# Patient Record
Sex: Female | Born: 1960 | Race: Black or African American | Hispanic: No | Smoking: Current every day smoker
Health system: Southern US, Community
[De-identification: ages and names within clinical notes are randomized; demographics above are authoritative.]

## PROBLEM LIST (undated history)

## (undated) DIAGNOSIS — R238 Other skin changes: Secondary | ICD-10-CM

## (undated) DIAGNOSIS — R350 Frequency of micturition: Secondary | ICD-10-CM

## (undated) DIAGNOSIS — J189 Pneumonia, unspecified organism: Secondary | ICD-10-CM

## (undated) DIAGNOSIS — M2390 Unspecified internal derangement of unspecified knee: Secondary | ICD-10-CM

## (undated) DIAGNOSIS — T4145XA Adverse effect of unspecified anesthetic, initial encounter: Secondary | ICD-10-CM

## (undated) DIAGNOSIS — Z8619 Personal history of other infectious and parasitic diseases: Secondary | ICD-10-CM

## (undated) DIAGNOSIS — R51 Headache: Secondary | ICD-10-CM

## (undated) DIAGNOSIS — M12819 Other specific arthropathies, not elsewhere classified, unspecified shoulder: Secondary | ICD-10-CM

## (undated) DIAGNOSIS — R0602 Shortness of breath: Secondary | ICD-10-CM

## (undated) DIAGNOSIS — F419 Anxiety disorder, unspecified: Secondary | ICD-10-CM

## (undated) DIAGNOSIS — J984 Other disorders of lung: Secondary | ICD-10-CM

## (undated) DIAGNOSIS — M7120 Synovial cyst of popliteal space [Baker], unspecified knee: Secondary | ICD-10-CM

## (undated) DIAGNOSIS — M8710 Osteonecrosis due to drugs, unspecified bone: Secondary | ICD-10-CM

## (undated) DIAGNOSIS — M7550 Bursitis of unspecified shoulder: Secondary | ICD-10-CM

## (undated) DIAGNOSIS — Z833 Family history of diabetes mellitus: Secondary | ICD-10-CM

## (undated) DIAGNOSIS — M549 Dorsalgia, unspecified: Secondary | ICD-10-CM

## (undated) DIAGNOSIS — Z9289 Personal history of other medical treatment: Secondary | ICD-10-CM

## (undated) DIAGNOSIS — G8929 Other chronic pain: Secondary | ICD-10-CM

## (undated) DIAGNOSIS — C55 Malignant neoplasm of uterus, part unspecified: Secondary | ICD-10-CM

## (undated) DIAGNOSIS — I10 Essential (primary) hypertension: Secondary | ICD-10-CM

## (undated) DIAGNOSIS — J439 Emphysema, unspecified: Secondary | ICD-10-CM

## (undated) DIAGNOSIS — R351 Nocturia: Secondary | ICD-10-CM

## (undated) DIAGNOSIS — F319 Bipolar disorder, unspecified: Secondary | ICD-10-CM

## (undated) DIAGNOSIS — K859 Acute pancreatitis without necrosis or infection, unspecified: Secondary | ICD-10-CM

## (undated) DIAGNOSIS — T8859XA Other complications of anesthesia, initial encounter: Secondary | ICD-10-CM

## (undated) DIAGNOSIS — K5909 Other constipation: Secondary | ICD-10-CM

## (undated) DIAGNOSIS — F32A Depression, unspecified: Secondary | ICD-10-CM

## (undated) DIAGNOSIS — R8789 Other abnormal findings in specimens from female genital organs: Secondary | ICD-10-CM

## (undated) DIAGNOSIS — R233 Spontaneous ecchymoses: Secondary | ICD-10-CM

## (undated) DIAGNOSIS — R32 Unspecified urinary incontinence: Secondary | ICD-10-CM

## (undated) DIAGNOSIS — M25569 Pain in unspecified knee: Secondary | ICD-10-CM

## (undated) DIAGNOSIS — M1712 Unilateral primary osteoarthritis, left knee: Secondary | ICD-10-CM

## (undated) DIAGNOSIS — R42 Dizziness and giddiness: Secondary | ICD-10-CM

## (undated) DIAGNOSIS — M2550 Pain in unspecified joint: Secondary | ICD-10-CM

## (undated) DIAGNOSIS — B20 Human immunodeficiency virus [HIV] disease: Secondary | ICD-10-CM

## (undated) DIAGNOSIS — T50905A Adverse effect of unspecified drugs, medicaments and biological substances, initial encounter: Secondary | ICD-10-CM

## (undated) DIAGNOSIS — H269 Unspecified cataract: Secondary | ICD-10-CM

## (undated) DIAGNOSIS — K219 Gastro-esophageal reflux disease without esophagitis: Secondary | ICD-10-CM

## (undated) DIAGNOSIS — Z720 Tobacco use: Secondary | ICD-10-CM

## (undated) DIAGNOSIS — M1990 Unspecified osteoarthritis, unspecified site: Secondary | ICD-10-CM

## (undated) DIAGNOSIS — F329 Major depressive disorder, single episode, unspecified: Secondary | ICD-10-CM

## (undated) DIAGNOSIS — K259 Gastric ulcer, unspecified as acute or chronic, without hemorrhage or perforation: Secondary | ICD-10-CM

## (undated) DIAGNOSIS — B029 Zoster without complications: Secondary | ICD-10-CM

## (undated) DIAGNOSIS — F10231 Alcohol dependence with withdrawal delirium: Secondary | ICD-10-CM

## (undated) DIAGNOSIS — M2540 Effusion, unspecified joint: Secondary | ICD-10-CM

## (undated) HISTORY — DX: Other disorders of lung: J98.4

## (undated) HISTORY — PX: OTHER SURGICAL HISTORY: SHX169

## (undated) HISTORY — PX: ESOPHAGOGASTRODUODENOSCOPY: SHX1529

## (undated) HISTORY — PX: CHOLECYSTECTOMY: SHX55

## (undated) HISTORY — DX: Human immunodeficiency virus (HIV) disease: B20

## (undated) HISTORY — DX: Tobacco use: Z72.0

## (undated) HISTORY — PX: BREAST BIOPSY: SHX20

## (undated) HISTORY — DX: Other abnormal findings in specimens from female genital organs: R87.89

## (undated) HISTORY — DX: Synovial cyst of popliteal space (Baker), unspecified knee: M71.20

## (undated) HISTORY — DX: Essential (primary) hypertension: I10

## (undated) HISTORY — DX: Gastro-esophageal reflux disease without esophagitis: K21.9

## (undated) HISTORY — PX: KNEE ARTHROSCOPY: SUR90

## (undated) HISTORY — DX: Other specific arthropathies, not elsewhere classified, unspecified shoulder: M12.819

## (undated) HISTORY — DX: Bursitis of unspecified shoulder: M75.50

## (undated) HISTORY — DX: Family history of diabetes mellitus: Z83.3

## (undated) HISTORY — PX: FINGER SURGERY: SHX640

## (undated) HISTORY — PX: JOINT REPLACEMENT: SHX530

## (undated) HISTORY — DX: Acute pancreatitis without necrosis or infection, unspecified: K85.90

## (undated) HISTORY — DX: Zoster without complications: B02.9

## (undated) HISTORY — PX: FRACTURE SURGERY: SHX138

## (undated) HISTORY — PX: BREAST LUMPECTOMY: SHX2

---
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
## 1999-10-23 DIAGNOSIS — B20 Human immunodeficiency virus [HIV] disease: Secondary | ICD-10-CM | POA: Insufficient documentation

## 1999-10-23 DIAGNOSIS — B029 Zoster without complications: Secondary | ICD-10-CM

## 1999-10-23 HISTORY — DX: Zoster without complications: B02.9

## 2001-02-19 DIAGNOSIS — Z8719 Personal history of other diseases of the digestive system: Secondary | ICD-10-CM | POA: Insufficient documentation

## 2001-02-22 ENCOUNTER — Inpatient Hospital Stay (HOSPITAL_COMMUNITY): Admission: EM | Admit: 2001-02-22 | Discharge: 2001-02-26 | Payer: Self-pay | Admitting: *Deleted

## 2001-02-22 ENCOUNTER — Encounter: Payer: Self-pay | Admitting: *Deleted

## 2001-03-28 ENCOUNTER — Ambulatory Visit (HOSPITAL_COMMUNITY): Admission: RE | Admit: 2001-03-28 | Discharge: 2001-03-28 | Payer: Self-pay | Admitting: Infectious Diseases

## 2001-03-28 ENCOUNTER — Encounter: Admission: RE | Admit: 2001-03-28 | Discharge: 2001-03-28 | Payer: Self-pay | Admitting: Infectious Diseases

## 2001-04-10 ENCOUNTER — Encounter: Admission: RE | Admit: 2001-04-10 | Discharge: 2001-04-10 | Payer: Self-pay | Admitting: Infectious Diseases

## 2001-04-29 ENCOUNTER — Encounter: Payer: Self-pay | Admitting: Internal Medicine

## 2001-04-29 ENCOUNTER — Ambulatory Visit (HOSPITAL_COMMUNITY): Admission: RE | Admit: 2001-04-29 | Discharge: 2001-04-29 | Payer: Self-pay | Admitting: Internal Medicine

## 2001-05-21 ENCOUNTER — Encounter: Admission: RE | Admit: 2001-05-21 | Discharge: 2001-05-21 | Payer: Self-pay | Admitting: Infectious Diseases

## 2001-06-13 ENCOUNTER — Ambulatory Visit (HOSPITAL_COMMUNITY): Admission: RE | Admit: 2001-06-13 | Discharge: 2001-06-13 | Payer: Self-pay | Admitting: Family Medicine

## 2001-06-13 ENCOUNTER — Encounter: Payer: Self-pay | Admitting: Family Medicine

## 2001-06-17 ENCOUNTER — Encounter (INDEPENDENT_AMBULATORY_CARE_PROVIDER_SITE_OTHER): Payer: Self-pay | Admitting: *Deleted

## 2001-06-17 ENCOUNTER — Encounter: Admission: RE | Admit: 2001-06-17 | Discharge: 2001-06-17 | Payer: Self-pay | Admitting: Obstetrics & Gynecology

## 2001-07-09 ENCOUNTER — Encounter: Admission: RE | Admit: 2001-07-09 | Discharge: 2001-07-09 | Payer: Self-pay | Admitting: Infectious Diseases

## 2001-07-21 ENCOUNTER — Encounter: Payer: Self-pay | Admitting: Surgery

## 2001-07-21 ENCOUNTER — Encounter (INDEPENDENT_AMBULATORY_CARE_PROVIDER_SITE_OTHER): Payer: Self-pay | Admitting: *Deleted

## 2001-07-21 ENCOUNTER — Ambulatory Visit (HOSPITAL_COMMUNITY): Admission: RE | Admit: 2001-07-21 | Discharge: 2001-07-21 | Payer: Self-pay | Admitting: Surgery

## 2001-08-13 ENCOUNTER — Encounter: Admission: RE | Admit: 2001-08-13 | Discharge: 2001-08-13 | Payer: Self-pay | Admitting: Infectious Diseases

## 2001-08-13 ENCOUNTER — Ambulatory Visit (HOSPITAL_COMMUNITY): Admission: RE | Admit: 2001-08-13 | Discharge: 2001-08-13 | Payer: Self-pay | Admitting: Infectious Diseases

## 2001-08-13 ENCOUNTER — Encounter (INDEPENDENT_AMBULATORY_CARE_PROVIDER_SITE_OTHER): Payer: Self-pay | Admitting: *Deleted

## 2001-08-13 LAB — CONVERTED CEMR LAB
CD4 Count: 240 microliters
CD4 T Cell Abs: 240

## 2001-08-25 ENCOUNTER — Encounter: Payer: Self-pay | Admitting: Infectious Diseases

## 2001-08-25 ENCOUNTER — Ambulatory Visit (HOSPITAL_COMMUNITY): Admission: RE | Admit: 2001-08-25 | Discharge: 2001-08-25 | Payer: Self-pay | Admitting: Infectious Diseases

## 2001-10-03 ENCOUNTER — Encounter: Admission: RE | Admit: 2001-10-03 | Discharge: 2001-10-03 | Payer: Self-pay | Admitting: Infectious Diseases

## 2001-10-22 DIAGNOSIS — J984 Other disorders of lung: Secondary | ICD-10-CM

## 2001-10-22 HISTORY — DX: Other disorders of lung: J98.4

## 2001-10-28 ENCOUNTER — Ambulatory Visit (HOSPITAL_COMMUNITY): Admission: RE | Admit: 2001-10-28 | Discharge: 2001-10-28 | Payer: Self-pay | Admitting: Infectious Diseases

## 2001-10-28 ENCOUNTER — Encounter: Payer: Self-pay | Admitting: Infectious Diseases

## 2001-10-28 ENCOUNTER — Encounter: Admission: RE | Admit: 2001-10-28 | Discharge: 2001-10-28 | Payer: Self-pay | Admitting: Infectious Diseases

## 2001-11-26 ENCOUNTER — Encounter: Admission: RE | Admit: 2001-11-26 | Discharge: 2001-11-26 | Payer: Self-pay | Admitting: Infectious Diseases

## 2002-01-13 ENCOUNTER — Encounter: Admission: RE | Admit: 2002-01-13 | Discharge: 2002-01-13 | Payer: Self-pay | Admitting: Internal Medicine

## 2002-01-13 ENCOUNTER — Encounter (INDEPENDENT_AMBULATORY_CARE_PROVIDER_SITE_OTHER): Payer: Self-pay | Admitting: *Deleted

## 2002-02-18 ENCOUNTER — Ambulatory Visit (HOSPITAL_COMMUNITY): Admission: RE | Admit: 2002-02-18 | Discharge: 2002-02-18 | Payer: Self-pay | Admitting: Infectious Diseases

## 2002-02-18 ENCOUNTER — Encounter: Admission: RE | Admit: 2002-02-18 | Discharge: 2002-02-18 | Payer: Self-pay | Admitting: Infectious Diseases

## 2002-04-26 ENCOUNTER — Encounter: Payer: Self-pay | Admitting: Emergency Medicine

## 2002-04-26 ENCOUNTER — Emergency Department (HOSPITAL_COMMUNITY): Admission: EM | Admit: 2002-04-26 | Discharge: 2002-04-27 | Payer: Self-pay | Admitting: Emergency Medicine

## 2002-05-06 ENCOUNTER — Encounter: Admission: RE | Admit: 2002-05-06 | Discharge: 2002-05-06 | Payer: Self-pay | Admitting: Infectious Diseases

## 2002-05-06 ENCOUNTER — Ambulatory Visit (HOSPITAL_COMMUNITY): Admission: RE | Admit: 2002-05-06 | Discharge: 2002-05-06 | Payer: Self-pay | Admitting: Infectious Diseases

## 2002-05-19 ENCOUNTER — Encounter: Admission: RE | Admit: 2002-05-19 | Discharge: 2002-05-19 | Payer: Self-pay | Admitting: *Deleted

## 2002-12-10 ENCOUNTER — Encounter: Admission: RE | Admit: 2002-12-10 | Discharge: 2002-12-10 | Payer: Self-pay | Admitting: Infectious Diseases

## 2003-01-13 ENCOUNTER — Ambulatory Visit (HOSPITAL_COMMUNITY): Admission: RE | Admit: 2003-01-13 | Discharge: 2003-01-13 | Payer: Self-pay | Admitting: Family Medicine

## 2003-01-13 ENCOUNTER — Encounter: Payer: Self-pay | Admitting: Family Medicine

## 2003-01-28 ENCOUNTER — Inpatient Hospital Stay (HOSPITAL_COMMUNITY): Admission: EM | Admit: 2003-01-28 | Discharge: 2003-02-03 | Payer: Self-pay | Admitting: Internal Medicine

## 2003-01-28 ENCOUNTER — Encounter: Payer: Self-pay | Admitting: Internal Medicine

## 2003-01-29 ENCOUNTER — Encounter: Payer: Self-pay | Admitting: *Deleted

## 2003-03-31 ENCOUNTER — Encounter: Admission: RE | Admit: 2003-03-31 | Discharge: 2003-03-31 | Payer: Self-pay | Admitting: Infectious Diseases

## 2003-03-31 ENCOUNTER — Encounter: Payer: Self-pay | Admitting: Infectious Diseases

## 2003-06-30 ENCOUNTER — Encounter: Admission: RE | Admit: 2003-06-30 | Discharge: 2003-06-30 | Payer: Self-pay | Admitting: Infectious Diseases

## 2003-08-30 ENCOUNTER — Encounter: Payer: Self-pay | Admitting: Infectious Diseases

## 2003-08-30 ENCOUNTER — Encounter: Admission: RE | Admit: 2003-08-30 | Discharge: 2003-08-30 | Payer: Self-pay | Admitting: Infectious Diseases

## 2003-08-30 ENCOUNTER — Ambulatory Visit (HOSPITAL_COMMUNITY): Admission: RE | Admit: 2003-08-30 | Discharge: 2003-08-30 | Payer: Self-pay | Admitting: Infectious Diseases

## 2003-09-03 ENCOUNTER — Encounter: Admission: RE | Admit: 2003-09-03 | Discharge: 2003-09-03 | Payer: Self-pay | Admitting: Infectious Diseases

## 2003-11-25 ENCOUNTER — Ambulatory Visit (HOSPITAL_COMMUNITY): Admission: RE | Admit: 2003-11-25 | Discharge: 2003-11-25 | Payer: Self-pay | Admitting: Family Medicine

## 2003-11-29 ENCOUNTER — Encounter: Admission: RE | Admit: 2003-11-29 | Discharge: 2003-11-29 | Payer: Self-pay | Admitting: Infectious Diseases

## 2003-12-20 ENCOUNTER — Encounter: Admission: RE | Admit: 2003-12-20 | Discharge: 2003-12-20 | Payer: Self-pay | Admitting: Infectious Diseases

## 2004-01-12 ENCOUNTER — Ambulatory Visit (HOSPITAL_COMMUNITY): Admission: RE | Admit: 2004-01-12 | Discharge: 2004-01-12 | Payer: Self-pay | Admitting: Infectious Diseases

## 2004-01-12 ENCOUNTER — Encounter: Admission: RE | Admit: 2004-01-12 | Discharge: 2004-01-12 | Payer: Self-pay | Admitting: Infectious Diseases

## 2004-05-02 ENCOUNTER — Ambulatory Visit (HOSPITAL_COMMUNITY): Admission: RE | Admit: 2004-05-02 | Discharge: 2004-05-02 | Payer: Self-pay | Admitting: *Deleted

## 2004-05-08 ENCOUNTER — Ambulatory Visit (HOSPITAL_COMMUNITY): Admission: RE | Admit: 2004-05-08 | Discharge: 2004-05-08 | Payer: Self-pay | Admitting: Infectious Diseases

## 2004-05-08 ENCOUNTER — Encounter: Admission: RE | Admit: 2004-05-08 | Discharge: 2004-05-08 | Payer: Self-pay | Admitting: Infectious Diseases

## 2004-05-10 ENCOUNTER — Ambulatory Visit (HOSPITAL_COMMUNITY): Admission: RE | Admit: 2004-05-10 | Discharge: 2004-05-10 | Payer: Self-pay | Admitting: Infectious Diseases

## 2004-07-18 ENCOUNTER — Emergency Department (HOSPITAL_COMMUNITY): Admission: EM | Admit: 2004-07-18 | Discharge: 2004-07-18 | Payer: Self-pay | Admitting: *Deleted

## 2004-07-31 ENCOUNTER — Emergency Department (HOSPITAL_COMMUNITY): Admission: EM | Admit: 2004-07-31 | Discharge: 2004-07-31 | Payer: Self-pay | Admitting: Emergency Medicine

## 2004-08-16 ENCOUNTER — Ambulatory Visit: Payer: Self-pay | Admitting: Infectious Diseases

## 2004-08-17 ENCOUNTER — Ambulatory Visit (HOSPITAL_COMMUNITY): Admission: RE | Admit: 2004-08-17 | Discharge: 2004-08-17 | Payer: Self-pay | Admitting: Family Medicine

## 2004-09-13 ENCOUNTER — Ambulatory Visit: Payer: Self-pay | Admitting: Family Medicine

## 2004-09-18 ENCOUNTER — Ambulatory Visit (HOSPITAL_COMMUNITY): Admission: RE | Admit: 2004-09-18 | Discharge: 2004-09-18 | Payer: Self-pay | Admitting: Family Medicine

## 2004-10-11 ENCOUNTER — Ambulatory Visit: Payer: Self-pay | Admitting: Infectious Diseases

## 2004-10-11 ENCOUNTER — Ambulatory Visit (HOSPITAL_COMMUNITY): Admission: RE | Admit: 2004-10-11 | Discharge: 2004-10-11 | Payer: Self-pay | Admitting: Infectious Diseases

## 2004-11-28 ENCOUNTER — Ambulatory Visit: Payer: Self-pay | Admitting: Family Medicine

## 2004-12-13 ENCOUNTER — Ambulatory Visit: Payer: Self-pay | Admitting: Family Medicine

## 2005-01-17 ENCOUNTER — Ambulatory Visit (HOSPITAL_COMMUNITY): Admission: RE | Admit: 2005-01-17 | Discharge: 2005-01-17 | Payer: Self-pay | Admitting: Infectious Diseases

## 2005-01-17 ENCOUNTER — Ambulatory Visit: Payer: Self-pay | Admitting: Infectious Diseases

## 2005-02-06 ENCOUNTER — Ambulatory Visit: Payer: Self-pay | Admitting: Family Medicine

## 2005-02-08 ENCOUNTER — Ambulatory Visit (HOSPITAL_COMMUNITY): Admission: RE | Admit: 2005-02-08 | Discharge: 2005-02-08 | Payer: Self-pay | Admitting: Family Medicine

## 2005-02-26 ENCOUNTER — Ambulatory Visit (HOSPITAL_COMMUNITY): Admission: RE | Admit: 2005-02-26 | Discharge: 2005-02-26 | Payer: Self-pay | Admitting: Infectious Diseases

## 2005-02-26 ENCOUNTER — Encounter (INDEPENDENT_AMBULATORY_CARE_PROVIDER_SITE_OTHER): Payer: Self-pay | Admitting: *Deleted

## 2005-02-26 ENCOUNTER — Ambulatory Visit: Payer: Self-pay | Admitting: Infectious Diseases

## 2005-02-26 LAB — CONVERTED CEMR LAB: CD4 Count: 340 microliters

## 2005-03-01 ENCOUNTER — Ambulatory Visit: Payer: Self-pay | Admitting: Family Medicine

## 2005-03-02 ENCOUNTER — Ambulatory Visit (HOSPITAL_COMMUNITY): Admission: RE | Admit: 2005-03-02 | Discharge: 2005-03-02 | Payer: Self-pay | Admitting: *Deleted

## 2005-03-05 ENCOUNTER — Ambulatory Visit (HOSPITAL_COMMUNITY): Admission: RE | Admit: 2005-03-05 | Discharge: 2005-03-05 | Payer: Self-pay | Admitting: Family Medicine

## 2005-03-16 ENCOUNTER — Ambulatory Visit (HOSPITAL_COMMUNITY): Admission: RE | Admit: 2005-03-16 | Discharge: 2005-03-16 | Payer: Self-pay | Admitting: Urology

## 2005-04-06 ENCOUNTER — Emergency Department (HOSPITAL_COMMUNITY): Admission: EM | Admit: 2005-04-06 | Discharge: 2005-04-06 | Payer: Self-pay | Admitting: Emergency Medicine

## 2005-04-12 ENCOUNTER — Ambulatory Visit: Payer: Self-pay | Admitting: Family Medicine

## 2005-04-16 ENCOUNTER — Ambulatory Visit (HOSPITAL_COMMUNITY): Admission: RE | Admit: 2005-04-16 | Discharge: 2005-04-16 | Payer: Self-pay | Admitting: Family Medicine

## 2005-05-09 ENCOUNTER — Ambulatory Visit: Payer: Self-pay | Admitting: Family Medicine

## 2005-06-30 ENCOUNTER — Emergency Department (HOSPITAL_COMMUNITY): Admission: EM | Admit: 2005-06-30 | Discharge: 2005-06-30 | Payer: Self-pay | Admitting: *Deleted

## 2005-08-23 ENCOUNTER — Ambulatory Visit: Payer: Self-pay | Admitting: Family Medicine

## 2005-10-18 ENCOUNTER — Ambulatory Visit: Payer: Self-pay | Admitting: Family Medicine

## 2005-10-29 ENCOUNTER — Emergency Department (HOSPITAL_COMMUNITY): Admission: EM | Admit: 2005-10-29 | Discharge: 2005-10-29 | Payer: Self-pay | Admitting: Emergency Medicine

## 2005-11-06 ENCOUNTER — Ambulatory Visit: Payer: Self-pay | Admitting: Family Medicine

## 2005-11-28 ENCOUNTER — Encounter (INDEPENDENT_AMBULATORY_CARE_PROVIDER_SITE_OTHER): Payer: Self-pay | Admitting: *Deleted

## 2005-11-28 ENCOUNTER — Ambulatory Visit: Payer: Self-pay | Admitting: Infectious Diseases

## 2006-01-02 ENCOUNTER — Ambulatory Visit: Payer: Self-pay | Admitting: Infectious Diseases

## 2006-01-15 ENCOUNTER — Ambulatory Visit: Payer: Self-pay | Admitting: Family Medicine

## 2006-02-12 ENCOUNTER — Observation Stay (HOSPITAL_COMMUNITY): Admission: EM | Admit: 2006-02-12 | Discharge: 2006-02-14 | Payer: Self-pay | Admitting: *Deleted

## 2006-02-13 ENCOUNTER — Ambulatory Visit: Payer: Self-pay | Admitting: *Deleted

## 2006-02-20 ENCOUNTER — Ambulatory Visit: Payer: Self-pay | Admitting: Infectious Diseases

## 2006-02-20 ENCOUNTER — Encounter (INDEPENDENT_AMBULATORY_CARE_PROVIDER_SITE_OTHER): Payer: Self-pay | Admitting: *Deleted

## 2006-02-20 ENCOUNTER — Encounter: Admission: RE | Admit: 2006-02-20 | Discharge: 2006-02-20 | Payer: Self-pay | Admitting: Infectious Diseases

## 2006-05-20 ENCOUNTER — Ambulatory Visit: Payer: Self-pay | Admitting: Family Medicine

## 2006-06-12 ENCOUNTER — Encounter: Admission: RE | Admit: 2006-06-12 | Discharge: 2006-06-12 | Payer: Self-pay | Admitting: Infectious Diseases

## 2006-06-12 ENCOUNTER — Ambulatory Visit: Payer: Self-pay | Admitting: Infectious Diseases

## 2006-06-12 ENCOUNTER — Encounter (INDEPENDENT_AMBULATORY_CARE_PROVIDER_SITE_OTHER): Payer: Self-pay | Admitting: *Deleted

## 2006-06-12 LAB — CONVERTED CEMR LAB
CD4 Count: 330 microliters
HIV 1 RNA Quant: 1920 copies/mL

## 2006-08-31 DIAGNOSIS — B029 Zoster without complications: Secondary | ICD-10-CM | POA: Insufficient documentation

## 2006-08-31 DIAGNOSIS — I10 Essential (primary) hypertension: Secondary | ICD-10-CM | POA: Insufficient documentation

## 2006-08-31 DIAGNOSIS — M7120 Synovial cyst of popliteal space [Baker], unspecified knee: Secondary | ICD-10-CM | POA: Insufficient documentation

## 2006-08-31 DIAGNOSIS — J45909 Unspecified asthma, uncomplicated: Secondary | ICD-10-CM | POA: Insufficient documentation

## 2006-08-31 DIAGNOSIS — K649 Unspecified hemorrhoids: Secondary | ICD-10-CM | POA: Insufficient documentation

## 2006-08-31 DIAGNOSIS — K219 Gastro-esophageal reflux disease without esophagitis: Secondary | ICD-10-CM

## 2006-12-03 DIAGNOSIS — R8789 Other abnormal findings in specimens from female genital organs: Secondary | ICD-10-CM

## 2006-12-03 DIAGNOSIS — F17200 Nicotine dependence, unspecified, uncomplicated: Secondary | ICD-10-CM

## 2006-12-05 ENCOUNTER — Ambulatory Visit: Payer: Self-pay | Admitting: Family Medicine

## 2006-12-06 ENCOUNTER — Encounter: Payer: Self-pay | Admitting: Family Medicine

## 2006-12-06 LAB — CONVERTED CEMR LAB
Hemoglobin, Urine: NEGATIVE
Nitrite: NEGATIVE
RBC / HPF: NONE SEEN (ref <3)
Urine Glucose: NEGATIVE mg/dL
pH: 7 (ref 5.0–8.0)

## 2006-12-16 ENCOUNTER — Encounter (INDEPENDENT_AMBULATORY_CARE_PROVIDER_SITE_OTHER): Payer: Self-pay | Admitting: *Deleted

## 2006-12-16 LAB — CONVERTED CEMR LAB

## 2006-12-29 ENCOUNTER — Encounter (INDEPENDENT_AMBULATORY_CARE_PROVIDER_SITE_OTHER): Payer: Self-pay | Admitting: *Deleted

## 2007-01-13 ENCOUNTER — Emergency Department (HOSPITAL_COMMUNITY): Admission: EM | Admit: 2007-01-13 | Discharge: 2007-01-13 | Payer: Self-pay | Admitting: Emergency Medicine

## 2007-01-14 ENCOUNTER — Emergency Department (HOSPITAL_COMMUNITY): Admission: EM | Admit: 2007-01-14 | Discharge: 2007-01-14 | Payer: Self-pay | Admitting: Emergency Medicine

## 2007-02-18 ENCOUNTER — Ambulatory Visit: Payer: Self-pay | Admitting: Family Medicine

## 2007-02-19 ENCOUNTER — Encounter: Payer: Self-pay | Admitting: Family Medicine

## 2007-03-03 ENCOUNTER — Encounter: Admission: RE | Admit: 2007-03-03 | Discharge: 2007-03-03 | Payer: Self-pay | Admitting: Infectious Diseases

## 2007-03-03 ENCOUNTER — Ambulatory Visit: Payer: Self-pay | Admitting: Infectious Diseases

## 2007-03-03 DIAGNOSIS — R109 Unspecified abdominal pain: Secondary | ICD-10-CM | POA: Insufficient documentation

## 2007-03-03 DIAGNOSIS — M25559 Pain in unspecified hip: Secondary | ICD-10-CM | POA: Insufficient documentation

## 2007-03-03 LAB — CONVERTED CEMR LAB
ALT: 15 units/L (ref 0–35)
AST: 27 units/L (ref 0–37)
Albumin: 4.5 g/dL (ref 3.5–5.2)
Alkaline Phosphatase: 122 units/L — ABNORMAL HIGH (ref 39–117)
Amylase: 49 units/L (ref 0–105)
BUN: 9 mg/dL (ref 6–23)
CD4 Count: 340 microliters
CO2: 28 meq/L (ref 19–32)
Calcium: 9.6 mg/dL (ref 8.4–10.5)
Chloride: 101 meq/L (ref 96–112)
Cholesterol: 148 mg/dL (ref 0–200)
Creatinine, Ser: 0.73 mg/dL (ref 0.40–1.20)
Glucose, Bld: 105 mg/dL — ABNORMAL HIGH (ref 70–99)
HCT: 37.3 % (ref 36.0–46.0)
HDL: 65 mg/dL (ref >39)
HIV 1 RNA Quant: 2300 copies/mL — ABNORMAL HIGH (ref <50)
Hemoglobin: 12.1 g/dL (ref 12.0–15.0)
LDL Cholesterol: 45 mg/dL (ref 0–99)
Lipase: 41 units/L (ref 0–75)
MCHC: 32.4 g/dL (ref 30.0–36.0)
MCV: 97.4 fL (ref 78.0–100.0)
Platelets: 257 10*3/uL (ref 150–400)
Potassium: 3.7 meq/L (ref 3.5–5.3)
RBC: 3.83 M/uL — ABNORMAL LOW (ref 3.87–5.11)
RDW: 14.3 % — ABNORMAL HIGH (ref 11.5–14.0)
Sodium: 137 meq/L (ref 135–145)
Total Bilirubin: 0.7 mg/dL (ref 0.3–1.2)
Total CHOL/HDL Ratio: 2.3
Total Protein: 8.9 g/dL — ABNORMAL HIGH (ref 6.0–8.3)
Triglycerides: 192 mg/dL — ABNORMAL HIGH (ref <150)
VLDL: 38 mg/dL (ref 0–40)
WBC: 3.3 10*3/uL — ABNORMAL LOW (ref 4.0–10.5)

## 2007-03-11 ENCOUNTER — Emergency Department (HOSPITAL_COMMUNITY): Admission: EM | Admit: 2007-03-11 | Discharge: 2007-03-11 | Payer: Self-pay | Admitting: Emergency Medicine

## 2007-03-31 ENCOUNTER — Emergency Department (HOSPITAL_COMMUNITY): Admission: EM | Admit: 2007-03-31 | Discharge: 2007-03-31 | Payer: Self-pay | Admitting: Emergency Medicine

## 2007-04-17 ENCOUNTER — Telehealth: Payer: Self-pay | Admitting: Infectious Diseases

## 2007-04-23 ENCOUNTER — Emergency Department (HOSPITAL_COMMUNITY): Admission: EM | Admit: 2007-04-23 | Discharge: 2007-04-23 | Payer: Self-pay | Admitting: Emergency Medicine

## 2007-05-02 ENCOUNTER — Inpatient Hospital Stay (HOSPITAL_COMMUNITY): Admission: EM | Admit: 2007-05-02 | Discharge: 2007-05-03 | Payer: Self-pay | Admitting: Emergency Medicine

## 2007-05-15 ENCOUNTER — Ambulatory Visit: Payer: Self-pay | Admitting: Family Medicine

## 2007-06-27 ENCOUNTER — Ambulatory Visit: Payer: Self-pay | Admitting: Internal Medicine

## 2007-06-28 ENCOUNTER — Inpatient Hospital Stay (HOSPITAL_COMMUNITY): Admission: EM | Admit: 2007-06-28 | Discharge: 2007-07-01 | Payer: Self-pay | Admitting: Emergency Medicine

## 2007-06-30 ENCOUNTER — Ambulatory Visit: Payer: Self-pay | Admitting: Internal Medicine

## 2007-07-20 ENCOUNTER — Emergency Department (HOSPITAL_COMMUNITY): Admission: EM | Admit: 2007-07-20 | Discharge: 2007-07-20 | Payer: Self-pay | Admitting: Hematology and Oncology

## 2007-10-23 ENCOUNTER — Encounter: Payer: Self-pay | Admitting: Family Medicine

## 2007-11-05 ENCOUNTER — Ambulatory Visit: Payer: Self-pay | Admitting: Family Medicine

## 2007-11-19 ENCOUNTER — Encounter: Payer: Self-pay | Admitting: Family Medicine

## 2007-11-19 LAB — CONVERTED CEMR LAB
BUN: 9 mg/dL (ref 6–23)
CO2: 25 meq/L (ref 19–32)
Chloride: 97 meq/L (ref 96–112)
Eosinophils Absolute: 0 10*3/uL (ref 0.0–0.7)
Glucose, Bld: 113 mg/dL — ABNORMAL HIGH (ref 70–99)
LDL Cholesterol: 48 mg/dL (ref 0–99)
Lymphocytes Relative: 62 % — ABNORMAL HIGH (ref 12–46)
Lymphs Abs: 2.1 10*3/uL (ref 0.7–4.0)
MCV: 102.2 fL — ABNORMAL HIGH (ref 78.0–100.0)
Monocytes Relative: 9 % (ref 3–12)
Neutro Abs: 0.9 10*3/uL — ABNORMAL LOW (ref 1.7–7.7)
Neutrophils Relative %: 28 % — ABNORMAL LOW (ref 43–77)
Platelets: 260 10*3/uL (ref 150–400)
Potassium: 4.3 meq/L (ref 3.5–5.3)
RBC: 3.63 M/uL — ABNORMAL LOW (ref 3.87–5.11)
Sodium: 135 meq/L (ref 135–145)
TSH: 1.158 microintl units/mL (ref 0.350–5.50)
Total CHOL/HDL Ratio: 2
VLDL: 24 mg/dL (ref 0–40)
WBC: 3.3 10*3/uL — ABNORMAL LOW (ref 4.0–10.5)

## 2007-11-20 ENCOUNTER — Encounter: Payer: Self-pay | Admitting: Family Medicine

## 2007-12-01 ENCOUNTER — Encounter: Payer: Self-pay | Admitting: Family Medicine

## 2007-12-01 DIAGNOSIS — J1189 Influenza due to unidentified influenza virus with other manifestations: Secondary | ICD-10-CM

## 2008-01-26 ENCOUNTER — Emergency Department (HOSPITAL_COMMUNITY): Admission: EM | Admit: 2008-01-26 | Discharge: 2008-01-26 | Payer: Self-pay | Admitting: Emergency Medicine

## 2008-01-27 ENCOUNTER — Encounter (INDEPENDENT_AMBULATORY_CARE_PROVIDER_SITE_OTHER): Payer: Self-pay | Admitting: *Deleted

## 2008-02-19 ENCOUNTER — Emergency Department (HOSPITAL_COMMUNITY): Admission: EM | Admit: 2008-02-19 | Discharge: 2008-02-19 | Payer: Self-pay | Admitting: Emergency Medicine

## 2008-03-02 ENCOUNTER — Encounter: Payer: Self-pay | Admitting: Infectious Diseases

## 2008-03-30 ENCOUNTER — Emergency Department (HOSPITAL_COMMUNITY): Admission: EM | Admit: 2008-03-30 | Discharge: 2008-03-30 | Payer: Self-pay | Admitting: Emergency Medicine

## 2008-04-21 ENCOUNTER — Ambulatory Visit: Payer: Self-pay | Admitting: Family Medicine

## 2008-04-27 ENCOUNTER — Emergency Department (HOSPITAL_COMMUNITY): Admission: EM | Admit: 2008-04-27 | Discharge: 2008-04-27 | Payer: Self-pay | Admitting: Emergency Medicine

## 2008-04-27 ENCOUNTER — Telehealth: Payer: Self-pay | Admitting: Infectious Diseases

## 2008-05-13 ENCOUNTER — Telehealth (INDEPENDENT_AMBULATORY_CARE_PROVIDER_SITE_OTHER): Payer: Self-pay | Admitting: *Deleted

## 2008-05-18 ENCOUNTER — Telehealth: Payer: Self-pay | Admitting: Family Medicine

## 2008-05-18 ENCOUNTER — Ambulatory Visit: Payer: Self-pay | Admitting: Family Medicine

## 2008-05-19 ENCOUNTER — Ambulatory Visit (HOSPITAL_COMMUNITY): Admission: RE | Admit: 2008-05-19 | Discharge: 2008-05-19 | Payer: Self-pay | Admitting: Family Medicine

## 2008-05-19 ENCOUNTER — Encounter: Payer: Self-pay | Admitting: Family Medicine

## 2008-05-21 ENCOUNTER — Telehealth: Payer: Self-pay | Admitting: Family Medicine

## 2008-05-27 ENCOUNTER — Telehealth: Payer: Self-pay | Admitting: Family Medicine

## 2008-06-08 ENCOUNTER — Telehealth: Payer: Self-pay | Admitting: Family Medicine

## 2008-06-17 ENCOUNTER — Encounter: Payer: Self-pay | Admitting: Family Medicine

## 2008-06-22 ENCOUNTER — Ambulatory Visit: Payer: Self-pay | Admitting: Family Medicine

## 2008-06-22 DIAGNOSIS — M542 Cervicalgia: Secondary | ICD-10-CM | POA: Insufficient documentation

## 2008-06-22 DIAGNOSIS — R110 Nausea: Secondary | ICD-10-CM | POA: Insufficient documentation

## 2008-06-22 LAB — CONVERTED CEMR LAB
Gardnerella vaginalis: POSITIVE — AB
Trichomonal Vaginitis: NEGATIVE

## 2008-06-23 ENCOUNTER — Telehealth: Payer: Self-pay | Admitting: Family Medicine

## 2008-06-25 ENCOUNTER — Encounter: Payer: Self-pay | Admitting: Family Medicine

## 2008-06-25 LAB — CONVERTED CEMR LAB
AST: 25 units/L (ref 0–37)
Absolute CD4: 468 #/uL (ref 381–1469)
Alkaline Phosphatase: 93 units/L (ref 39–117)
Bilirubin, Direct: <0.1 mg/dL (ref 0.0–0.3)
CO2: 25 meq/L (ref 19–32)
Calcium: 9.3 mg/dL (ref 8.4–10.5)
Chloride: 103 meq/L (ref 96–112)
Creatinine, Ser: 0.82 mg/dL (ref 0.40–1.20)
Eosinophils Relative: 0 % (ref 0–5)
Glucose, Bld: 88 mg/dL (ref 70–99)
HCT: 37.7 % (ref 36.0–46.0)
HDL: 64 mg/dL (ref >39)
Hemoglobin: 12.1 g/dL (ref 12.0–15.0)
LDL Cholesterol: 59 mg/dL (ref 0–99)
Lymphocytes Relative: 55 % — ABNORMAL HIGH (ref 12–46)
Lymphs Abs: 2 10*3/uL (ref 0.7–4.0)
Monocytes Absolute: 0.4 10*3/uL (ref 0.1–1.0)
Neutro Abs: 1.3 10*3/uL — ABNORMAL LOW (ref 1.7–7.7)
RBC: 3.97 M/uL (ref 3.87–5.11)
Total Bilirubin: 0.3 mg/dL (ref 0.3–1.2)
Total CHOL/HDL Ratio: 2.8
Total Protein: 9 g/dL — ABNORMAL HIGH (ref 6.0–8.3)
WBC, lymph enumeration: 3.7 10*3/uL — ABNORMAL LOW (ref 4.0–10.5)
WBC: 3.7 10*3/uL — ABNORMAL LOW (ref 4.0–10.5)

## 2008-06-30 ENCOUNTER — Encounter: Payer: Self-pay | Admitting: Family Medicine

## 2008-07-02 ENCOUNTER — Encounter: Payer: Self-pay | Admitting: Family Medicine

## 2008-07-21 ENCOUNTER — Telehealth: Payer: Self-pay | Admitting: Family Medicine

## 2008-07-22 ENCOUNTER — Ambulatory Visit: Payer: Self-pay | Admitting: Family Medicine

## 2008-08-04 ENCOUNTER — Encounter: Payer: Self-pay | Admitting: Family Medicine

## 2008-08-24 ENCOUNTER — Emergency Department (HOSPITAL_COMMUNITY): Admission: EM | Admit: 2008-08-24 | Discharge: 2008-08-24 | Payer: Self-pay | Admitting: Emergency Medicine

## 2008-08-25 ENCOUNTER — Telehealth: Payer: Self-pay | Admitting: Family Medicine

## 2008-09-06 ENCOUNTER — Ambulatory Visit: Payer: Self-pay | Admitting: Family Medicine

## 2008-09-13 ENCOUNTER — Telehealth: Payer: Self-pay | Admitting: Family Medicine

## 2008-09-15 ENCOUNTER — Encounter: Payer: Self-pay | Admitting: Family Medicine

## 2008-09-21 ENCOUNTER — Ambulatory Visit (HOSPITAL_COMMUNITY): Admission: RE | Admit: 2008-09-21 | Discharge: 2008-09-21 | Payer: Self-pay | Admitting: Family Medicine

## 2008-09-29 ENCOUNTER — Telehealth: Payer: Self-pay | Admitting: Family Medicine

## 2008-10-05 ENCOUNTER — Ambulatory Visit: Payer: Self-pay | Admitting: Family Medicine

## 2008-10-05 DIAGNOSIS — N3000 Acute cystitis without hematuria: Secondary | ICD-10-CM

## 2008-10-05 LAB — CONVERTED CEMR LAB
Blood in Urine, dipstick: NEGATIVE
Glucose, Urine, Semiquant: NEGATIVE
Ketones, urine, test strip: NEGATIVE
Nitrite: NEGATIVE
pH: 7

## 2008-10-18 ENCOUNTER — Encounter: Payer: Self-pay | Admitting: Family Medicine

## 2008-10-18 ENCOUNTER — Telehealth: Payer: Self-pay | Admitting: Family Medicine

## 2008-10-19 ENCOUNTER — Encounter: Payer: Self-pay | Admitting: Family Medicine

## 2008-10-20 ENCOUNTER — Encounter: Payer: Self-pay | Admitting: Family Medicine

## 2008-10-21 ENCOUNTER — Telehealth: Payer: Self-pay | Admitting: Family Medicine

## 2008-11-01 ENCOUNTER — Telehealth: Payer: Self-pay | Admitting: Family Medicine

## 2008-11-08 ENCOUNTER — Encounter: Payer: Self-pay | Admitting: Family Medicine

## 2008-11-11 ENCOUNTER — Telehealth: Payer: Self-pay | Admitting: Family Medicine

## 2008-11-15 ENCOUNTER — Encounter (HOSPITAL_COMMUNITY): Admission: RE | Admit: 2008-11-15 | Discharge: 2008-12-15 | Payer: Self-pay | Admitting: Family Medicine

## 2008-11-15 ENCOUNTER — Telehealth: Payer: Self-pay | Admitting: Family Medicine

## 2008-11-16 ENCOUNTER — Encounter: Payer: Self-pay | Admitting: Family Medicine

## 2008-11-16 ENCOUNTER — Telehealth: Payer: Self-pay | Admitting: Family Medicine

## 2008-11-17 ENCOUNTER — Telehealth: Payer: Self-pay | Admitting: Family Medicine

## 2008-11-19 ENCOUNTER — Encounter: Payer: Self-pay | Admitting: Family Medicine

## 2008-11-22 ENCOUNTER — Encounter: Payer: Self-pay | Admitting: Family Medicine

## 2008-12-06 ENCOUNTER — Encounter: Payer: Self-pay | Admitting: Family Medicine

## 2008-12-14 ENCOUNTER — Encounter: Payer: Self-pay | Admitting: Family Medicine

## 2008-12-16 ENCOUNTER — Encounter (HOSPITAL_COMMUNITY): Admission: RE | Admit: 2008-12-16 | Discharge: 2009-01-15 | Payer: Self-pay | Admitting: Family Medicine

## 2009-01-03 ENCOUNTER — Telehealth: Payer: Self-pay | Admitting: Family Medicine

## 2009-01-05 ENCOUNTER — Ambulatory Visit: Payer: Self-pay | Admitting: Family Medicine

## 2009-01-05 DIAGNOSIS — J209 Acute bronchitis, unspecified: Secondary | ICD-10-CM

## 2009-01-06 ENCOUNTER — Telehealth: Payer: Self-pay | Admitting: Family Medicine

## 2009-01-10 DIAGNOSIS — F329 Major depressive disorder, single episode, unspecified: Secondary | ICD-10-CM | POA: Insufficient documentation

## 2009-01-18 ENCOUNTER — Encounter (HOSPITAL_COMMUNITY): Admission: RE | Admit: 2009-01-18 | Discharge: 2009-02-03 | Payer: Self-pay | Admitting: Family Medicine

## 2009-01-18 ENCOUNTER — Ambulatory Visit: Payer: Self-pay | Admitting: Family Medicine

## 2009-02-02 ENCOUNTER — Telehealth: Payer: Self-pay | Admitting: Family Medicine

## 2009-02-02 ENCOUNTER — Encounter: Payer: Self-pay | Admitting: Family Medicine

## 2009-02-08 ENCOUNTER — Telehealth: Payer: Self-pay | Admitting: Family Medicine

## 2009-02-21 ENCOUNTER — Ambulatory Visit: Payer: Self-pay | Admitting: Family Medicine

## 2009-02-21 DIAGNOSIS — R51 Headache: Secondary | ICD-10-CM | POA: Insufficient documentation

## 2009-02-21 DIAGNOSIS — N62 Hypertrophy of breast: Secondary | ICD-10-CM | POA: Insufficient documentation

## 2009-02-21 DIAGNOSIS — R32 Unspecified urinary incontinence: Secondary | ICD-10-CM

## 2009-02-21 DIAGNOSIS — R519 Headache, unspecified: Secondary | ICD-10-CM | POA: Insufficient documentation

## 2009-02-21 DIAGNOSIS — J3089 Other allergic rhinitis: Secondary | ICD-10-CM | POA: Insufficient documentation

## 2009-02-21 LAB — CONVERTED CEMR LAB
Total Lymphocytes %: 49 % — ABNORMAL HIGH (ref 12–46)
Total lymphocyte count: 2401 cells/mcL (ref 700–3300)
WBC, lymph enumeration: 4.9 10*3/uL (ref 4.0–10.5)

## 2009-02-25 ENCOUNTER — Telehealth: Payer: Self-pay | Admitting: Family Medicine

## 2009-02-25 ENCOUNTER — Telehealth: Payer: Self-pay

## 2009-02-28 ENCOUNTER — Telehealth: Payer: Self-pay | Admitting: Family Medicine

## 2009-03-10 ENCOUNTER — Encounter: Payer: Self-pay | Admitting: Infectious Diseases

## 2009-03-14 ENCOUNTER — Encounter: Payer: Self-pay | Admitting: Family Medicine

## 2009-03-15 ENCOUNTER — Encounter: Payer: Self-pay | Admitting: Family Medicine

## 2009-03-17 ENCOUNTER — Telehealth: Payer: Self-pay | Admitting: Family Medicine

## 2009-03-17 ENCOUNTER — Encounter: Payer: Self-pay | Admitting: Family Medicine

## 2009-04-04 ENCOUNTER — Ambulatory Visit: Payer: Self-pay | Admitting: Family Medicine

## 2009-04-04 DIAGNOSIS — J029 Acute pharyngitis, unspecified: Secondary | ICD-10-CM | POA: Insufficient documentation

## 2009-04-11 ENCOUNTER — Encounter: Payer: Self-pay | Admitting: Family Medicine

## 2009-04-12 ENCOUNTER — Telehealth: Payer: Self-pay | Admitting: Family Medicine

## 2009-04-13 ENCOUNTER — Ambulatory Visit: Payer: Self-pay | Admitting: Infectious Diseases

## 2009-04-13 LAB — CONVERTED CEMR LAB
Alkaline Phosphatase: 104 units/L (ref 39–117)
BUN: 10 mg/dL (ref 6–23)
Creatinine, Ser: 0.73 mg/dL (ref 0.40–1.20)
Eosinophils Relative: 1 % (ref 0–5)
GFR calc non Af Amer: >60 mL/min (ref >60)
Glucose, Bld: 108 mg/dL — ABNORMAL HIGH (ref 70–99)
HCT: 38.2 % (ref 36.0–46.0)
Hemoglobin: 12.7 g/dL (ref 12.0–15.0)
Lymphocytes Relative: 48 % — ABNORMAL HIGH (ref 12–46)
Lymphs Abs: 1.8 10*3/uL (ref 0.7–4.0)
Monocytes Absolute: 0.3 10*3/uL (ref 0.1–1.0)
Platelets: 241 10*3/uL (ref 150–400)
Total Bilirubin: 0.5 mg/dL (ref 0.3–1.2)
WBC: 3.8 10*3/uL — ABNORMAL LOW (ref 4.0–10.5)

## 2009-04-18 ENCOUNTER — Encounter: Payer: Self-pay | Admitting: Infectious Diseases

## 2009-04-18 LAB — CONVERTED CEMR LAB

## 2009-04-19 ENCOUNTER — Encounter: Payer: Self-pay | Admitting: Family Medicine

## 2009-04-19 LAB — CONVERTED CEMR LAB
HIV 1 RNA Quant: 10100 copies/mL — ABNORMAL HIGH (ref <48)
HIV-1 RNA Quant, Log: 4 — ABNORMAL HIGH (ref <1.68)

## 2009-04-20 ENCOUNTER — Ambulatory Visit (HOSPITAL_COMMUNITY): Payer: Self-pay | Admitting: Psychiatry

## 2009-04-20 ENCOUNTER — Ambulatory Visit: Payer: Self-pay | Admitting: Family Medicine

## 2009-04-20 ENCOUNTER — Telehealth: Payer: Self-pay | Admitting: Family Medicine

## 2009-04-21 ENCOUNTER — Telehealth: Payer: Self-pay | Admitting: Family Medicine

## 2009-04-26 ENCOUNTER — Encounter: Payer: Self-pay | Admitting: Family Medicine

## 2009-04-27 ENCOUNTER — Encounter: Payer: Self-pay | Admitting: Family Medicine

## 2009-05-02 ENCOUNTER — Encounter: Payer: Self-pay | Admitting: Family Medicine

## 2009-05-04 ENCOUNTER — Telehealth: Payer: Self-pay | Admitting: Family Medicine

## 2009-05-05 ENCOUNTER — Encounter: Payer: Self-pay | Admitting: Family Medicine

## 2009-05-16 ENCOUNTER — Ambulatory Visit: Payer: Self-pay | Admitting: Family Medicine

## 2009-05-16 DIAGNOSIS — IMO0002 Reserved for concepts with insufficient information to code with codable children: Secondary | ICD-10-CM

## 2009-05-17 ENCOUNTER — Telehealth (INDEPENDENT_AMBULATORY_CARE_PROVIDER_SITE_OTHER): Payer: Self-pay | Admitting: Licensed Clinical Social Worker

## 2009-05-17 ENCOUNTER — Telehealth: Payer: Self-pay | Admitting: Family Medicine

## 2009-05-18 ENCOUNTER — Telehealth: Payer: Self-pay | Admitting: Family Medicine

## 2009-05-20 ENCOUNTER — Ambulatory Visit (HOSPITAL_COMMUNITY): Payer: Self-pay | Admitting: Psychiatry

## 2009-05-24 ENCOUNTER — Telehealth: Payer: Self-pay | Admitting: Family Medicine

## 2009-05-24 ENCOUNTER — Encounter: Payer: Self-pay | Admitting: Family Medicine

## 2009-05-26 ENCOUNTER — Ambulatory Visit (HOSPITAL_COMMUNITY): Admission: RE | Admit: 2009-05-26 | Discharge: 2009-05-26 | Payer: Self-pay | Admitting: Family Medicine

## 2009-05-31 ENCOUNTER — Encounter: Payer: Self-pay | Admitting: Family Medicine

## 2009-06-03 ENCOUNTER — Ambulatory Visit (HOSPITAL_COMMUNITY): Payer: Self-pay | Admitting: Psychiatry

## 2009-06-09 ENCOUNTER — Encounter: Payer: Self-pay | Admitting: Family Medicine

## 2009-06-10 ENCOUNTER — Ambulatory Visit (HOSPITAL_COMMUNITY): Admission: RE | Admit: 2009-06-10 | Discharge: 2009-06-10 | Payer: Self-pay | Admitting: Family Medicine

## 2009-06-13 ENCOUNTER — Telehealth: Payer: Self-pay | Admitting: Family Medicine

## 2009-06-13 ENCOUNTER — Encounter: Payer: Self-pay | Admitting: Family Medicine

## 2009-06-14 ENCOUNTER — Encounter: Payer: Self-pay | Admitting: Family Medicine

## 2009-06-16 ENCOUNTER — Telehealth: Payer: Self-pay | Admitting: Family Medicine

## 2009-06-17 ENCOUNTER — Telehealth: Payer: Self-pay | Admitting: Family Medicine

## 2009-06-17 ENCOUNTER — Ambulatory Visit (HOSPITAL_COMMUNITY): Payer: Self-pay | Admitting: Psychiatry

## 2009-06-21 ENCOUNTER — Encounter: Payer: Self-pay | Admitting: Family Medicine

## 2009-06-30 ENCOUNTER — Ambulatory Visit: Payer: Self-pay | Admitting: Family Medicine

## 2009-06-30 DIAGNOSIS — H9190 Unspecified hearing loss, unspecified ear: Secondary | ICD-10-CM | POA: Insufficient documentation

## 2009-06-30 DIAGNOSIS — J328 Other chronic sinusitis: Secondary | ICD-10-CM | POA: Insufficient documentation

## 2009-07-01 ENCOUNTER — Ambulatory Visit (HOSPITAL_COMMUNITY): Payer: Self-pay | Admitting: Psychiatry

## 2009-07-05 ENCOUNTER — Ambulatory Visit (HOSPITAL_COMMUNITY): Payer: Self-pay | Admitting: Psychiatry

## 2009-07-06 ENCOUNTER — Encounter: Payer: Self-pay | Admitting: Family Medicine

## 2009-07-19 ENCOUNTER — Ambulatory Visit: Payer: Self-pay | Admitting: Family Medicine

## 2009-07-20 ENCOUNTER — Encounter: Payer: Self-pay | Admitting: Family Medicine

## 2009-07-20 ENCOUNTER — Ambulatory Visit (HOSPITAL_COMMUNITY): Payer: Self-pay | Admitting: Psychiatry

## 2009-07-21 ENCOUNTER — Encounter: Payer: Self-pay | Admitting: Family Medicine

## 2009-07-26 ENCOUNTER — Encounter: Payer: Self-pay | Admitting: Family Medicine

## 2009-07-28 ENCOUNTER — Ambulatory Visit (HOSPITAL_COMMUNITY): Payer: Self-pay | Admitting: Psychiatry

## 2009-07-29 ENCOUNTER — Telehealth: Payer: Self-pay | Admitting: Family Medicine

## 2009-08-01 ENCOUNTER — Telehealth: Payer: Self-pay | Admitting: Family Medicine

## 2009-08-01 ENCOUNTER — Encounter: Payer: Self-pay | Admitting: Family Medicine

## 2009-08-02 ENCOUNTER — Ambulatory Visit (HOSPITAL_COMMUNITY): Payer: Self-pay | Admitting: Psychiatry

## 2009-08-03 ENCOUNTER — Encounter: Payer: Self-pay | Admitting: Family Medicine

## 2009-08-03 ENCOUNTER — Telehealth: Payer: Self-pay | Admitting: Family Medicine

## 2009-08-08 ENCOUNTER — Telehealth: Payer: Self-pay | Admitting: Family Medicine

## 2009-08-11 ENCOUNTER — Ambulatory Visit (HOSPITAL_COMMUNITY): Payer: Self-pay | Admitting: Psychiatry

## 2009-08-11 ENCOUNTER — Ambulatory Visit: Payer: Self-pay | Admitting: Family Medicine

## 2009-08-11 DIAGNOSIS — E739 Lactose intolerance, unspecified: Secondary | ICD-10-CM

## 2009-08-11 DIAGNOSIS — R5383 Other fatigue: Secondary | ICD-10-CM

## 2009-08-11 DIAGNOSIS — R5381 Other malaise: Secondary | ICD-10-CM | POA: Insufficient documentation

## 2009-08-11 LAB — CONVERTED CEMR LAB: Hgb A1c MFr Bld: 5.2 %

## 2009-08-12 ENCOUNTER — Telehealth: Payer: Self-pay | Admitting: Family Medicine

## 2009-08-16 ENCOUNTER — Telehealth: Payer: Self-pay | Admitting: Family Medicine

## 2009-08-17 ENCOUNTER — Ambulatory Visit: Payer: Self-pay | Admitting: Infectious Diseases

## 2009-08-17 LAB — CONVERTED CEMR LAB
ALT: 25 units/L (ref 0–35)
AST: 28 units/L (ref 0–37)
Albumin: 4.3 g/dL (ref 3.5–5.2)
Alkaline Phosphatase: 125 units/L — ABNORMAL HIGH (ref 39–117)
Basophils Absolute: 0 10*3/uL (ref 0.0–0.1)
Basophils Relative: 0 % (ref 0–1)
HIV 1 RNA Quant: 673 copies/mL — ABNORMAL HIGH (ref <48)
HIV-1 RNA Quant, Log: 2.83 — ABNORMAL HIGH (ref <1.68)
Hemoglobin: 11.8 g/dL — ABNORMAL LOW (ref 12.0–15.0)
Lymphocytes Relative: 43 % (ref 12–46)
MCHC: 32.4 g/dL (ref 30.0–36.0)
Neutro Abs: 2 10*3/uL (ref 1.7–7.7)
Neutrophils Relative %: 47 % (ref 43–77)
Potassium: 4.1 meq/L (ref 3.5–5.3)
RDW: 14.4 % (ref 11.5–15.5)
Sodium: 139 meq/L (ref 135–145)
Total Bilirubin: 0.4 mg/dL (ref 0.3–1.2)
Total Protein: 8.3 g/dL (ref 6.0–8.3)

## 2009-08-18 ENCOUNTER — Encounter: Payer: Self-pay | Admitting: Family Medicine

## 2009-08-19 ENCOUNTER — Encounter: Payer: Self-pay | Admitting: Family Medicine

## 2009-08-22 ENCOUNTER — Telehealth: Payer: Self-pay | Admitting: Family Medicine

## 2009-08-30 ENCOUNTER — Ambulatory Visit (HOSPITAL_COMMUNITY): Payer: Self-pay | Admitting: Psychiatry

## 2009-08-31 ENCOUNTER — Telehealth: Payer: Self-pay | Admitting: Family Medicine

## 2009-09-02 ENCOUNTER — Telehealth: Payer: Self-pay | Admitting: Infectious Diseases

## 2009-09-05 ENCOUNTER — Ambulatory Visit (HOSPITAL_COMMUNITY): Payer: Self-pay | Admitting: Psychiatry

## 2009-09-09 ENCOUNTER — Telehealth: Payer: Self-pay | Admitting: Family Medicine

## 2009-09-12 ENCOUNTER — Emergency Department (HOSPITAL_COMMUNITY): Admission: EM | Admit: 2009-09-12 | Discharge: 2009-09-12 | Payer: Self-pay | Admitting: Emergency Medicine

## 2009-09-12 ENCOUNTER — Telehealth: Payer: Self-pay | Admitting: Family Medicine

## 2009-09-12 ENCOUNTER — Encounter: Payer: Self-pay | Admitting: Family Medicine

## 2009-09-13 ENCOUNTER — Encounter: Payer: Self-pay | Admitting: Family Medicine

## 2009-09-14 ENCOUNTER — Telehealth: Payer: Self-pay | Admitting: Family Medicine

## 2009-09-19 ENCOUNTER — Ambulatory Visit (HOSPITAL_COMMUNITY): Payer: Self-pay | Admitting: Psychiatry

## 2009-09-21 ENCOUNTER — Encounter: Payer: Self-pay | Admitting: Family Medicine

## 2009-09-22 ENCOUNTER — Encounter: Payer: Self-pay | Admitting: Family Medicine

## 2009-09-26 ENCOUNTER — Telehealth: Payer: Self-pay | Admitting: Family Medicine

## 2009-09-29 ENCOUNTER — Ambulatory Visit: Payer: Self-pay | Admitting: Family Medicine

## 2009-10-06 ENCOUNTER — Telehealth (INDEPENDENT_AMBULATORY_CARE_PROVIDER_SITE_OTHER): Payer: Self-pay

## 2009-10-10 ENCOUNTER — Encounter: Payer: Self-pay | Admitting: Family Medicine

## 2009-10-11 DIAGNOSIS — E669 Obesity, unspecified: Secondary | ICD-10-CM

## 2009-10-12 ENCOUNTER — Encounter: Payer: Self-pay | Admitting: Family Medicine

## 2009-10-18 ENCOUNTER — Encounter: Payer: Self-pay | Admitting: Family Medicine

## 2009-10-20 ENCOUNTER — Encounter: Payer: Self-pay | Admitting: Family Medicine

## 2009-10-20 ENCOUNTER — Telehealth: Payer: Self-pay | Admitting: Family Medicine

## 2009-11-10 ENCOUNTER — Encounter: Payer: Self-pay | Admitting: Family Medicine

## 2009-11-15 ENCOUNTER — Inpatient Hospital Stay (HOSPITAL_COMMUNITY): Admission: EM | Admit: 2009-11-15 | Discharge: 2009-11-23 | Payer: Self-pay | Admitting: Emergency Medicine

## 2009-11-16 ENCOUNTER — Encounter: Payer: Self-pay | Admitting: Infectious Diseases

## 2009-11-16 DIAGNOSIS — A319 Mycobacterial infection, unspecified: Secondary | ICD-10-CM | POA: Insufficient documentation

## 2009-11-29 ENCOUNTER — Ambulatory Visit: Payer: Self-pay | Admitting: Family Medicine

## 2009-12-01 ENCOUNTER — Telehealth: Payer: Self-pay | Admitting: Family Medicine

## 2009-12-06 ENCOUNTER — Telehealth: Payer: Self-pay | Admitting: Family Medicine

## 2009-12-06 ENCOUNTER — Ambulatory Visit: Payer: Self-pay | Admitting: Family Medicine

## 2009-12-07 ENCOUNTER — Ambulatory Visit: Payer: Self-pay | Admitting: Infectious Diseases

## 2009-12-07 LAB — CONVERTED CEMR LAB
BUN: 12 mg/dL (ref 6–23)
CO2: 21 meq/L (ref 19–32)
Cholesterol: 262 mg/dL — ABNORMAL HIGH (ref 0–200)
Creatinine, Ser: 0.86 mg/dL (ref 0.40–1.20)
Eosinophils Absolute: 0 10*3/uL (ref 0.0–0.7)
Glucose, Bld: 114 mg/dL — ABNORMAL HIGH (ref 70–99)
HDL: 66 mg/dL (ref >39)
Hemoglobin: 13.2 g/dL (ref 12.0–15.0)
Lymphs Abs: 2.8 10*3/uL (ref 0.7–4.0)
MCV: 97.4 fL (ref >=78.0)
Monocytes Absolute: 0.4 10*3/uL (ref 0.1–1.0)
Monocytes Relative: 7 % (ref 3–12)
Neutrophils Relative %: 49 % (ref 43–77)
RBC: 4.19 M/uL (ref 3.87–5.11)
Sodium: 138 meq/L (ref 135–145)
Total Bilirubin: 1.3 mg/dL — ABNORMAL HIGH (ref 0.3–1.2)
Total CHOL/HDL Ratio: 4
Total Protein: 8.5 g/dL — ABNORMAL HIGH (ref 6.0–8.3)
WBC: 6.4 10*3/uL (ref 4.0–10.5)

## 2009-12-08 ENCOUNTER — Encounter: Payer: Self-pay | Admitting: Family Medicine

## 2009-12-08 ENCOUNTER — Telehealth: Payer: Self-pay | Admitting: Infectious Diseases

## 2009-12-12 ENCOUNTER — Encounter: Payer: Self-pay | Admitting: Family Medicine

## 2009-12-13 ENCOUNTER — Telehealth: Payer: Self-pay | Admitting: Family Medicine

## 2009-12-14 ENCOUNTER — Telehealth: Payer: Self-pay | Admitting: Family Medicine

## 2009-12-19 ENCOUNTER — Ambulatory Visit: Payer: Self-pay | Admitting: Family Medicine

## 2009-12-19 ENCOUNTER — Encounter: Payer: Self-pay | Admitting: Infectious Diseases

## 2009-12-19 DIAGNOSIS — K5900 Constipation, unspecified: Secondary | ICD-10-CM | POA: Insufficient documentation

## 2009-12-26 ENCOUNTER — Encounter: Payer: Self-pay | Admitting: Family Medicine

## 2010-01-06 ENCOUNTER — Ambulatory Visit (HOSPITAL_COMMUNITY): Payer: Self-pay | Admitting: Psychiatry

## 2010-01-09 ENCOUNTER — Ambulatory Visit: Payer: Self-pay | Admitting: Infectious Diseases

## 2010-01-09 ENCOUNTER — Telehealth: Payer: Self-pay | Admitting: Family Medicine

## 2010-01-09 ENCOUNTER — Encounter: Payer: Self-pay | Admitting: Family Medicine

## 2010-01-10 ENCOUNTER — Encounter: Payer: Self-pay | Admitting: Family Medicine

## 2010-01-13 ENCOUNTER — Ambulatory Visit (HOSPITAL_COMMUNITY): Payer: Self-pay | Admitting: Psychiatry

## 2010-01-17 ENCOUNTER — Encounter: Payer: Self-pay | Admitting: Family Medicine

## 2010-01-19 ENCOUNTER — Ambulatory Visit (HOSPITAL_COMMUNITY): Payer: Self-pay | Admitting: Psychiatry

## 2010-01-19 ENCOUNTER — Encounter: Payer: Self-pay | Admitting: Infectious Diseases

## 2010-02-02 ENCOUNTER — Ambulatory Visit (HOSPITAL_COMMUNITY): Payer: Self-pay | Admitting: Psychology

## 2010-02-08 ENCOUNTER — Telehealth: Payer: Self-pay | Admitting: Family Medicine

## 2010-02-09 ENCOUNTER — Encounter: Payer: Self-pay | Admitting: Family Medicine

## 2010-02-09 ENCOUNTER — Ambulatory Visit (HOSPITAL_COMMUNITY): Payer: Self-pay | Admitting: Psychiatry

## 2010-02-23 ENCOUNTER — Ambulatory Visit (HOSPITAL_COMMUNITY): Payer: Self-pay | Admitting: Psychology

## 2010-02-24 ENCOUNTER — Encounter: Payer: Self-pay | Admitting: Family Medicine

## 2010-03-02 ENCOUNTER — Ambulatory Visit (HOSPITAL_COMMUNITY): Payer: Self-pay | Admitting: Psychiatry

## 2010-03-06 ENCOUNTER — Ambulatory Visit: Payer: Self-pay | Admitting: Family Medicine

## 2010-03-06 DIAGNOSIS — J019 Acute sinusitis, unspecified: Secondary | ICD-10-CM

## 2010-03-09 ENCOUNTER — Telehealth: Payer: Self-pay | Admitting: Family Medicine

## 2010-03-10 ENCOUNTER — Encounter: Payer: Self-pay | Admitting: Family Medicine

## 2010-03-13 ENCOUNTER — Telehealth: Payer: Self-pay | Admitting: Family Medicine

## 2010-03-14 ENCOUNTER — Encounter: Payer: Self-pay | Admitting: Family Medicine

## 2010-03-16 ENCOUNTER — Ambulatory Visit (HOSPITAL_COMMUNITY): Payer: Self-pay | Admitting: Psychiatry

## 2010-03-21 ENCOUNTER — Ambulatory Visit (HOSPITAL_COMMUNITY): Payer: Self-pay | Admitting: Psychology

## 2010-03-30 ENCOUNTER — Ambulatory Visit: Payer: Self-pay | Admitting: Family Medicine

## 2010-03-31 ENCOUNTER — Encounter: Payer: Self-pay | Admitting: Family Medicine

## 2010-04-03 ENCOUNTER — Encounter: Payer: Self-pay | Admitting: Family Medicine

## 2010-04-05 ENCOUNTER — Encounter: Payer: Self-pay | Admitting: Family Medicine

## 2010-04-05 ENCOUNTER — Ambulatory Visit: Payer: Self-pay | Admitting: Infectious Diseases

## 2010-04-05 LAB — CONVERTED CEMR LAB
ALT: 18 units/L (ref 0–35)
BUN: 9 mg/dL (ref 6–23)
CO2: 22 meq/L (ref 19–32)
Calcium: 8.7 mg/dL (ref 8.4–10.5)
Chloride: 102 meq/L (ref 96–112)
Creatinine, Ser: 0.64 mg/dL (ref 0.40–1.20)
Eosinophils Absolute: 0 10*3/uL (ref 0.0–0.7)
Eosinophils Relative: 1 % (ref 0–5)
Glucose, Bld: 84 mg/dL (ref 70–99)
HCT: 35.5 % — ABNORMAL LOW (ref 36.0–46.0)
HIV 1 RNA Quant: 764 copies/mL — ABNORMAL HIGH (ref <48)
Lymphs Abs: 2.3 10*3/uL (ref 0.7–4.0)
MCV: 91.7 fL (ref 78.0–100.0)
Monocytes Absolute: 0.4 10*3/uL (ref 0.1–1.0)
Monocytes Relative: 8 % (ref 3–12)
RBC: 3.87 M/uL (ref 3.87–5.11)
WBC: 4.7 10*3/uL (ref 4.0–10.5)

## 2010-04-09 ENCOUNTER — Encounter: Payer: Self-pay | Admitting: Family Medicine

## 2010-04-11 ENCOUNTER — Encounter: Payer: Self-pay | Admitting: Family Medicine

## 2010-04-18 ENCOUNTER — Encounter: Payer: Self-pay | Admitting: Family Medicine

## 2010-05-05 ENCOUNTER — Encounter: Payer: Self-pay | Admitting: Family Medicine

## 2010-05-08 ENCOUNTER — Telehealth: Payer: Self-pay | Admitting: Family Medicine

## 2010-05-09 ENCOUNTER — Encounter: Payer: Self-pay | Admitting: Family Medicine

## 2010-05-09 ENCOUNTER — Ambulatory Visit (HOSPITAL_COMMUNITY): Payer: Self-pay | Admitting: Psychiatry

## 2010-05-10 ENCOUNTER — Encounter: Payer: Self-pay | Admitting: Family Medicine

## 2010-05-12 LAB — CONVERTED CEMR LAB
Benzodiazepines.: POSITIVE — AB
Creatinine,U: 120 mg/dL
Opiates: NEGATIVE
Phencyclidine (PCP): NEGATIVE
Propoxyphene: NEGATIVE

## 2010-05-15 ENCOUNTER — Encounter: Payer: Self-pay | Admitting: Family Medicine

## 2010-05-29 ENCOUNTER — Telehealth (INDEPENDENT_AMBULATORY_CARE_PROVIDER_SITE_OTHER): Payer: Self-pay | Admitting: *Deleted

## 2010-05-30 ENCOUNTER — Ambulatory Visit: Payer: Self-pay | Admitting: Family Medicine

## 2010-05-30 DIAGNOSIS — R7301 Impaired fasting glucose: Secondary | ICD-10-CM

## 2010-06-08 ENCOUNTER — Encounter: Payer: Self-pay | Admitting: Family Medicine

## 2010-06-08 ENCOUNTER — Telehealth: Payer: Self-pay | Admitting: Family Medicine

## 2010-06-22 ENCOUNTER — Telehealth: Payer: Self-pay | Admitting: Family Medicine

## 2010-06-22 ENCOUNTER — Encounter: Payer: Self-pay | Admitting: Family Medicine

## 2010-06-29 ENCOUNTER — Ambulatory Visit: Payer: Self-pay | Admitting: Family Medicine

## 2010-06-29 ENCOUNTER — Ambulatory Visit (HOSPITAL_COMMUNITY): Payer: Self-pay | Admitting: Psychology

## 2010-07-05 ENCOUNTER — Ambulatory Visit: Payer: Self-pay | Admitting: Family Medicine

## 2010-07-07 ENCOUNTER — Encounter: Payer: Self-pay | Admitting: Family Medicine

## 2010-07-26 ENCOUNTER — Ambulatory Visit (HOSPITAL_COMMUNITY)
Admission: RE | Admit: 2010-07-26 | Discharge: 2010-07-26 | Payer: Self-pay | Source: Home / Self Care | Admitting: Family Medicine

## 2010-08-02 ENCOUNTER — Encounter: Payer: Self-pay | Admitting: Family Medicine

## 2010-08-02 ENCOUNTER — Ambulatory Visit: Payer: Self-pay | Admitting: Infectious Diseases

## 2010-08-02 LAB — CONVERTED CEMR LAB
ALT: 17 units/L (ref 0–35)
Albumin: 4.8 g/dL (ref 3.5–5.2)
Basophils Absolute: 0 10*3/uL (ref 0.0–0.1)
CO2: 24 meq/L (ref 19–32)
Calcium: 10 mg/dL (ref 8.4–10.5)
Chloride: 99 meq/L (ref 96–112)
Creatinine, Ser: 0.73 mg/dL (ref 0.40–1.20)
Hemoglobin: 13.4 g/dL (ref 12.0–15.0)
Lymphocytes Relative: 51 % — ABNORMAL HIGH (ref 12–46)
Monocytes Absolute: 0.5 10*3/uL (ref 0.1–1.0)
Neutro Abs: 2.2 10*3/uL (ref 1.7–7.7)
Potassium: 4 meq/L (ref 3.5–5.3)
RDW: 14.4 % (ref 11.5–15.5)
Sodium: 137 meq/L (ref 135–145)
Total Protein: 8.9 g/dL — ABNORMAL HIGH (ref 6.0–8.3)

## 2010-08-03 ENCOUNTER — Telehealth: Payer: Self-pay | Admitting: Family Medicine

## 2010-08-04 ENCOUNTER — Encounter: Payer: Self-pay | Admitting: Family Medicine

## 2010-08-07 ENCOUNTER — Encounter: Payer: Self-pay | Admitting: Family Medicine

## 2010-08-13 LAB — CONVERTED CEMR LAB

## 2010-08-17 ENCOUNTER — Telehealth: Payer: Self-pay | Admitting: Family Medicine

## 2010-08-24 ENCOUNTER — Encounter: Payer: Self-pay | Admitting: Family Medicine

## 2010-08-30 ENCOUNTER — Telehealth (INDEPENDENT_AMBULATORY_CARE_PROVIDER_SITE_OTHER): Payer: Self-pay | Admitting: *Deleted

## 2010-08-31 ENCOUNTER — Telehealth (INDEPENDENT_AMBULATORY_CARE_PROVIDER_SITE_OTHER): Payer: Self-pay | Admitting: *Deleted

## 2010-09-05 ENCOUNTER — Encounter (INDEPENDENT_AMBULATORY_CARE_PROVIDER_SITE_OTHER): Payer: Self-pay | Admitting: *Deleted

## 2010-09-05 ENCOUNTER — Ambulatory Visit (HOSPITAL_COMMUNITY): Payer: Self-pay | Admitting: Psychology

## 2010-09-11 ENCOUNTER — Telehealth (INDEPENDENT_AMBULATORY_CARE_PROVIDER_SITE_OTHER): Payer: Self-pay | Admitting: *Deleted

## 2010-09-11 ENCOUNTER — Ambulatory Visit: Payer: Self-pay | Admitting: Family Medicine

## 2010-09-24 ENCOUNTER — Emergency Department (HOSPITAL_COMMUNITY)
Admission: EM | Admit: 2010-09-24 | Discharge: 2010-09-24 | Payer: Self-pay | Source: Home / Self Care | Admitting: Emergency Medicine

## 2010-09-26 ENCOUNTER — Ambulatory Visit: Payer: Self-pay | Admitting: Family Medicine

## 2010-09-26 DIAGNOSIS — S93409A Sprain of unspecified ligament of unspecified ankle, initial encounter: Secondary | ICD-10-CM | POA: Insufficient documentation

## 2010-10-05 ENCOUNTER — Ambulatory Visit (HOSPITAL_COMMUNITY): Payer: Self-pay | Admitting: Psychiatry

## 2010-10-17 ENCOUNTER — Telehealth: Payer: Self-pay | Admitting: Family Medicine

## 2010-10-24 ENCOUNTER — Ambulatory Visit (HOSPITAL_COMMUNITY): Admit: 2010-10-24 | Payer: Self-pay | Admitting: Psychiatry

## 2010-10-25 ENCOUNTER — Telehealth: Payer: Self-pay | Admitting: Family Medicine

## 2010-10-31 ENCOUNTER — Ambulatory Visit (HOSPITAL_COMMUNITY)
Admission: RE | Admit: 2010-10-31 | Discharge: 2010-10-31 | Payer: Self-pay | Source: Home / Self Care | Attending: Psychiatry | Admitting: Psychiatry

## 2010-10-31 ENCOUNTER — Telehealth: Payer: Self-pay | Admitting: Family Medicine

## 2010-11-02 ENCOUNTER — Telehealth: Payer: Self-pay | Admitting: Family Medicine

## 2010-11-07 ENCOUNTER — Encounter: Payer: Self-pay | Admitting: Family Medicine

## 2010-11-09 ENCOUNTER — Ambulatory Visit (HOSPITAL_COMMUNITY)
Admission: RE | Admit: 2010-11-09 | Discharge: 2010-11-09 | Payer: Self-pay | Source: Home / Self Care | Attending: Psychology | Admitting: Psychology

## 2010-11-11 ENCOUNTER — Encounter: Payer: Self-pay | Admitting: Family Medicine

## 2010-11-11 ENCOUNTER — Encounter: Payer: Self-pay | Admitting: Gastroenterology

## 2010-11-12 ENCOUNTER — Encounter: Payer: Self-pay | Admitting: Family Medicine

## 2010-11-12 ENCOUNTER — Encounter: Payer: Self-pay | Admitting: Infectious Diseases

## 2010-11-13 ENCOUNTER — Encounter: Payer: Self-pay | Admitting: Family Medicine

## 2010-11-14 ENCOUNTER — Other Ambulatory Visit: Payer: Self-pay | Admitting: Neurosurgery

## 2010-11-14 DIAGNOSIS — M545 Low back pain, unspecified: Secondary | ICD-10-CM

## 2010-11-15 ENCOUNTER — Ambulatory Visit
Admission: RE | Admit: 2010-11-15 | Discharge: 2010-11-15 | Payer: Self-pay | Source: Home / Self Care | Attending: Infectious Diseases | Admitting: Infectious Diseases

## 2010-11-15 ENCOUNTER — Encounter: Payer: Self-pay | Admitting: Family Medicine

## 2010-11-15 LAB — CONVERTED CEMR LAB
BUN: 10 mg/dL (ref 6–23)
CO2: 24 meq/L (ref 19–32)
Creatinine, Ser: 0.72 mg/dL (ref 0.40–1.20)
Eosinophils Absolute: 0 10*3/uL (ref 0.0–0.7)
Eosinophils Relative: 0 % (ref 0–5)
Glucose, Bld: 82 mg/dL (ref 70–99)
HCT: 37.8 % (ref 36.0–46.0)
HIV-1 RNA Quant, Log: 4.33 — ABNORMAL HIGH (ref <1.30)
LDL Cholesterol: 97 mg/dL (ref 0–99)
Lymphs Abs: 3.6 10*3/uL (ref 0.7–4.0)
MCV: 90 fL (ref 78.0–100.0)
Monocytes Relative: 9 % (ref 3–12)
Neutrophils Relative %: 39 % — ABNORMAL LOW (ref 43–77)
RBC: 4.2 M/uL (ref 3.87–5.11)
Total Bilirubin: 0.3 mg/dL (ref 0.3–1.2)
Triglycerides: 136 mg/dL (ref <150)
VLDL: 27 mg/dL (ref 0–40)
WBC: 6.9 10*3/uL (ref 4.0–10.5)

## 2010-11-16 LAB — T-HELPER CELL (CD4) - (RCID CLINIC ONLY): CD4 % Helper T Cell: 20 % — ABNORMAL LOW (ref 33–55)

## 2010-11-21 NOTE — Assessment & Plan Note (Signed)
Summary: certification   Allergies: 1)  ! Sulfa 2)  ! Septra 3)  ! Pcn 4)  ! * Contrast Dye   Complete Medication List: 1)  Ambien 10 Mg Tabs (Zolpidem tartrate) .... Take 1 tablet by mouth at bedtime as needed 2)  Hydroxyzine Hcl 25 Mg Tabs (Hydroxyzine hcl) .... As needed 3)  Nexium 40 Mg Cpdr (Esomeprazole magnesium) .... Take 1 capsule by mouth once a day 4)  Trizivir 300-150-300 Mg Tabs (Abacavir-lamivudine-zidovudine) .... Take 1 tablet by mouth twice a day 5)  Verapamil Hcl Cr 240 Mg Tbcr (Verapamil hcl) .... Take 1 tablet by mouth once a day 6)  Qvar 40 Mcg/act Aers (Beclomethasone dipropionate) .... 2 puffs once daily 7)  Xanax 1 Mg Tabs (Alprazolam) .... Take 1 tablet by mouth two times a day as needed 8)  Singulair 10 Mg Tabs (Montelukast sodium) .... Take 1 tablet by mouth once a day 9)  Promethazine Hcl 25 Mg Tabs (Promethazine hcl) .... Take 1 tablet by mouth two times a day as needed 10)  Duoneb 0.5-2.5 (3) Mg/67ml Soln (Ipratropium-albuterol) .... One vial per nebulizer three times a day prn 11)  Ventolin Hfa 108 (90 Base) Mcg/act Aers (Albuterol sulfate) .... 2 puffs every 4-6 hrs as needed 12)  Diphenoxylate-atropine 2.5-0.025 Mg Tabs (Diphenoxylate-atropine) .... One tab by mouth once daily prn 13)  Tylox 5-500 Mg Caps (Oxycodone-acetaminophen) .... Take 1 tablet by mouth two times a day 14)  Flonase 50 Mcg/act Susp (Fluticasone propionate) .... As needed 15)  Lactulose 10 Gm/32ml Soln (Lactulose) .... 30 cc every 3 days as needed for constipation 16)  Norvir 100 Mg Caps (Ritonavir) .... Take 1 tablet by mouth once a day 17)  Prezista 400 Mg Tabs (Darunavir ethanolate) .... 2 tab once daily 18)  Risperidone 0.5 Mg Tabs (Risperidone) .... One tab by mouth qd 19)  Amitriptyline Hcl 10 Mg Tabs (Amitriptyline hcl) .... One tab by mouth qhs 20)  Allegra 60 Mg Tabs (Fexofenadine hcl) .... One tab by mouth qd 21)  Diclofenac Potassium 50 Mg Tabs (Diclofenac potassium) ....  One and half tabs by mouth two times a day 22)  Gabapentin 300 Mg Caps (Gabapentin) .... One cap by mouth at bedtime 23)  Clindamycin Hcl 150 Mg Caps (Clindamycin hcl) .... Uad 24)  Prednisone 10 Mg Tabs (Prednisone) .... Uad 25)  Nicoderm Cq 14 Mg/24hr Pt24 (Nicotine) .... Apply one patch daily for 2 weeks 26)  Nicoderm Cq 7 Mg/24hr Pt24 (Nicotine) .... Apply one pach daily for 2 weeks , start 02/25 2011 certification form reviewed amnd signed

## 2010-11-21 NOTE — Progress Notes (Signed)
Summary: rx  Phone Note Call from Patient   Summary of Call: pt will pick up rx tomorrow [phone] Initial call taken by: Rudene Anda,  Mar 09, 2010 1:52 PM  Follow-up for Phone Call        noted Follow-up by: Adella Hare LPN,  Mar 09, 2010 1:54 PM

## 2010-11-21 NOTE — Miscellaneous (Signed)
Summary: Home Care Report  Home Care Report   Imported By: Lind Guest 11/29/2009 11:29:54  _____________________________________________________________________  External Attachment:    Type:   Image     Comment:   External Document

## 2010-11-21 NOTE — Assessment & Plan Note (Signed)
Summary: bp   Vital Signs:  Patient profile:   50 year old female Menstrual status:  hysterectomy Height:      64 inches Weight:      204.50 pounds BMI:     35.23 O2 Sat:      92 % Pulse rate:   97 / minute Pulse rhythm:   regular Resp:     18 per minute BP sitting:   130 / 90  (left arm) Cuff size:   regular  Vitals Entered By: Everitt Amber LPN (May 30, 2010 8:36 AM)  Nutrition Counseling: Patient's BMI is greater than 25 and therefore counseled on weight management options. CC: Was at dentist yesterday and her BP was too high so they couldn't pull her teeth Comments didn't bring meds   Primary Care Provider:  [name]  CC:  Was at dentist yesterday and her BP was too high so they couldn't pull her teeth.  History of Present Illness: Pt states she was tyold yeasterday that her BP was too high for extraction, 158/113 reportedly, she states she was drinking on this past Friday, and denies cocaine use. Reports  that prior to thios she had been doing well. Denies recent fever or chills. Denies sinus pressure, nasal congestion , ear pain or sore throat. Denies chest congestion, or cough productive of sputum. Denies chest pain, palpitations, PND, orthopnea or leg swelling. Denies abdominal pain, nausea, vomitting, diarrhea or constipation. Denies change in bowel movements or bloody stool. Denies dysuria , frequency, incontinence or hesitancy.  Denies headaches, vertigo, seizures. Denies depression, anxiety or insomnia.Treated by mental health services Denies  rash, lesions, or itch. 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     Preventive Screening-Counseling & Management  Alcohol-Tobacco     Smoking Cessation Counseling: yes  Allergies (verified): 1)  ! Sulfa 2)  !  Pcn  Review of Systems      See HPI General:  Complains of fatigue and sleep disorder. Eyes:  Denies discharge and red eye. GU:  Complains of dysuria and urinary frequency; 1 week history esp in the night. MS:  Complains of joint pain. Allergy:  Complains of seasonal allergies; being trrated by all;ergist, recently was on a dose pack.  Physical Exam  General:  Well-developed,well-nourished,in no acute distress; alert,appropriate and cooperative throughout examination HEENT: No facial asymmetry,  EOMI, No sinus tenderness, TM's Clear, oropharynx  pink and moist.   Chest: Clear to auscultation bilaterally.  CVS: S1, S2, No murmurs, No S3.   Abd: Soft, Nontender.  MS: Adequate ROM spine, hips, shoulders and knees.  Ext: No edema.   CNS: CN 2-12 intact, power tone and sensation normal throughout.   Skin: Intact, no visible lesions or rashes.  Psych: Good eye contact, normal affect. mildly  anxious, but not depressed appearing.    Impression & Recommendations:  Problem # 1:  ENCOUNTER FOR LONG-TERM USE OF OTHER MEDICATIONS (ICD-V58.69) Assessment Comment Only  Orders: T-Hepatic Function (54098-11914) T-Lipid Profile (78295-62130) went over the narc contract with the pt and she signed the agreement for random drug screening  Problem # 2:  HYPERTENSION (ICD-401.9) Assessment: Deteriorated  Her updated medication list for this problem includes:    Verapamil Hcl Cr 240 Mg Tbcr (Verapamil hcl) .Marland Kitchen... Take 1 tablet by  mouth once a day    Clonidine Hcl 0.1 Mg Tabs (Clonidine hcl) .Marland Kitchen... Take 1 tab by mouth at bedtime  BP today: 130/90 Prior BP: 120/89 (04/05/2010)  Labs Reviewed: K+: 3.6 (04/05/2010) Creat: : 0.64 (04/05/2010)   Chol: 262 (12/07/2009)   HDL: 66 (12/07/2009)   LDL: * mg/dL (16/07/9603)   TG: 540 (12/07/2009)  Problem # 3:  GERD (ICD-530.81) Assessment: Unchanged  Her updated medication list for this problem includes:    Nexium 40 Mg Cpdr (Esomeprazole  magnesium) .Marland Kitchen... Take 1 capsule by mouth once a day  Problem # 4:  OBESITY, UNSPECIFIED (ICD-278.00) Assessment: Unchanged  Ht: 64 (05/30/2010)   Wt: 204.50 (05/30/2010)   BMI: 35.23 (05/30/2010)  Complete Medication List: 1)  Ambien 10 Mg Tabs (Zolpidem tartrate) .... Take 1 tablet by mouth at bedtime as needed 2)  Hydroxyzine Hcl 25 Mg Tabs (Hydroxyzine hcl) .... As needed 3)  Nexium 40 Mg Cpdr (Esomeprazole magnesium) .... Take 1 capsule by mouth once a day 4)  Trizivir 300-150-300 Mg Tabs (Abacavir-lamivudine-zidovudine) .... Take 1 tablet by mouth twice a day 5)  Verapamil Hcl Cr 240 Mg Tbcr (Verapamil hcl) .... Take 1 tablet by mouth once a day 6)  Qvar 40 Mcg/act Aers (Beclomethasone dipropionate) .... 2 puffs two times a day 7)  Xanax 1 Mg Tabs (Alprazolam) .... Take 1 tablet by mouth two times a day as needed 8)  Promethazine Hcl 25 Mg Tabs (Promethazine hcl) .... Take 1 tablet by mouth two times a day as needed 9)  Ventolin Hfa 108 (90 Base) Mcg/act Aers (Albuterol sulfate) .... 2 puffs every 4-6 hrs as needed 10)  Diphenoxylate-atropine 2.5-0.025 Mg Tabs (Diphenoxylate-atropine) .... One tab by mouth once daily prn 11)  Norvir 100 Mg Tabs (Ritonavir) .... Take 1 tablet by mouth once a day 12)  Prezista 400 Mg Tabs (Darunavir ethanolate) .... 2 tab once daily 13)  Gabapentin 300 Mg Caps (Gabapentin) .... One cap by mouth at bedtime 14)  Clonidine Hcl 0.1 Mg Tabs (Clonidine hcl) .... Take 1 tab by mouth at bedtime 15)  Seroquel 100 Mg Tabs (Quetiapine fumarate) .... Take one tablet by mouth at bedtime. 16)  Tylox 5-500 Mg Caps (Oxycodone-acetaminophen) .... Take 1 capsule by mouth once a day  Other Orders: T-TSH ([id number]) T- Hemoglobin A1C ([id number])  Patient Instructions: 1)  Please schedule a follow-up appointment in 2 months. 2)  Tobacco is very bad for your health and your loved ones! You Should stop smoking!. 3)  Stop Smoking Tips: Choose a Quit date. Cut down  before the Quit date. decide what you will do as a substitute when you feel the urge to smoke(gum,toothpick,exercise). 4)  It is important that you exercise regularly at least 20 minutes 5 times a week. If you develop chest pain, have severe difficulty breathing, or feel very tired , stop exercising immediately and seek medical attention. 5)  You need to lose weight. Consider a lower calorie diet and regular exercise.  6)  Hepatic Panel prior to visit, ICD-9: 7)  Lipid Panel prior to visit, ICD-9: 8)  TSH prior to visit, ICD-9:   fasdting labs today 9)  HbgA1C prior to visit, ICD-9: Prescriptions: TYLOX 5-500 MG CAPS (OXYCODONE-ACETAMINOPHEN) Take 1 capsule by mouth once a day  #30 x 0   Entered and Authorized by:   [name]   Signed by:   [name] on 05/30/2010   Method used:   Historical   RxID:  [id number]  

## 2010-11-21 NOTE — Letter (Signed)
Summary: Letter  Letter   Imported By: Lind Guest 03/31/2010 16:22:41  _____________________________________________________________________  External Attachment:    Type:   Image     Comment:   External Document

## 2010-11-21 NOTE — Miscellaneous (Signed)
Summary: NARC REFILL  Clinical Lists Changes  Medications: Rx of TYLOX 5-500 MG CAPS (OXYCODONE-ACETAMINOPHEN) Take 1 tablet by mouth two times a day;  #60 x 0;  Signed;  Entered by: Everitt Amber LPN;  Authorized by: [name];  Method used: Handwritten    Prescriptions: TYLOX 5-500 MG CAPS (OXYCODONE-ACETAMINOPHEN) Take 1 tablet by mouth two times a day  #60 x 0   Entered by:   Everitt Amber LPN   Authorized by:   [name]   Signed by:   Everitt Amber LPN on 04/54/0981   Method used:   Handwritten   RxID:   [id number]

## 2010-11-21 NOTE — Letter (Signed)
Summary: dr. Murray Hodgkins  dr. Murray Hodgkins   Imported By: Lind Guest 09/27/2010 11:19:14  _____________________________________________________________________  External Attachment:    Type:   Image     Comment:   External Document

## 2010-11-21 NOTE — Progress Notes (Signed)
Summary: [facility]  [facility]   Imported By: Lind Guest 02/24/2010 09:25:40  _____________________________________________________________________  External Attachment:    Type:   Image     Comment:   External Document

## 2010-11-21 NOTE — Progress Notes (Signed)
Summary: BP HIGH  Phone Note Call from Patient   Summary of Call: NURSE FROM [facility]  SAID THAT HER  BP IS HIGH CALL HER AT [phone] Initial call taken by: Lind Guest,  December 14, 2009 11:46 AM  Follow-up for Phone Call        patient wants to know if she should be on more bp meds? was taken off of amlodipine at last hospital visit, states bp was up yesterday bottom # was 90's Follow-up by: Adella Hare LPN,  December 14, 2009 1:30 PM  Additional Follow-up for Phone Call Additional follow up Details #1::        Advised she needs an appointment. Luann to schedule Additional Follow-up by: Everitt Amber LPN,  December 14, 2009 4:19 PM

## 2010-11-21 NOTE — Progress Notes (Signed)
Summary: Missing medications   Phone Note Call from Patient   Caller: [phone] Summary of Call: Pt states medication was to be called to the pharmacy for her at her last OV.  She stated she was taking her HIV regimen incorrectly and was missing two medications.  No meds wee sent to the pharmacy.  Dr Ninetta Lights what should she be taking? [facility] Initial call taken by: Tomasita Morrow RN,  December 08, 2009 10:38 AM  Follow-up for Phone Call        darunavir, ritonavir, trizivir (see med list) thanks

## 2010-11-21 NOTE — Assessment & Plan Note (Signed)
Summary: F/U/VS   Primary Provider:  [name]  CC:  1 month follow up.  History of Present Illness: 50 yo F with hx of hospitalization in [facility] , d/c 2-2 with temp 102.9 , confusion, SOB and cough. 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 She was treated with prednisone, clinida and primaquine and imrpoved rapidly. She also had acute ARF (improved with hydration) as well as increased CPK. She had not been taking her DRV prior to arrival. In hospital her CD4 140 was and VL 235.   Now with CD4 630 and VL 51 (12-07-09).  Having trouble with night sweats and night mares since on DRV.   Preventive Screening-Counseling & Management  Alcohol-Tobacco     Alcohol drinks/day: occassionally     Alcohol type: beer/wine     Smoking Status: current     Smoking Cessation Counseling: yes     Smoke Cessation Stage: contemplative     Packs/Day: 0.5  Caffeine-Diet-Exercise     Caffeine use/day: 0     Does Patient Exercise: no  Safety-Violence-Falls     Seat Belt Use: yes   Updated Prior Medication List: AMBIEN 10 MG TABS (ZOLPIDEM TARTRATE) Take 1 tablet by mouth at bedtime as needed HYDROXYZINE HCL 25 MG TABS (HYDROXYZINE HCL) as needed NEXIUM 40 MG CPDR (ESOMEPRAZOLE MAGNESIUM) Take 1 capsule by mouth once a day TRIZIVIR 300-150-300 MG TABS (ABACAVIR-LAMIVUDINE-ZIDOVUDINE) Take 1 tablet by mouth twice a day VERAPAMIL HCL CR 240 MG TBCR (VERAPAMIL HCL) Take 1 tablet by mouth once a day QVAR 40 MCG/ACT AERS (BECLOMETHASONE DIPROPIONATE) 2 puffs once daily XANAX 1 MG  TABS (ALPRAZOLAM) Take 1 tablet by mouth two times a day as needed PROMETHAZINE HCL 25 MG TABS (PROMETHAZINE HCL) Take 1 tablet by mouth two times a day as needed VENTOLIN HFA 108 (90 BASE) MCG/ACT AERS (ALBUTEROL SULFATE) 2 puffs every 4-6 hrs as needed DIPHENOXYLATE-ATROPINE 2.5-0.025 MG TABS (DIPHENOXYLATE-ATROPINE) one tab by mouth once daily prn NORVIR 100 MG CAPS (RITONAVIR) Take 1 tablet by mouth once a  day PREZISTA 400 MG TABS (DARUNAVIR ETHANOLATE) 2 tab once daily GABAPENTIN 300 MG CAPS (GABAPENTIN) one cap by mouth at bedtime NICODERM CQ 14 MG/24HR PT24 (NICOTINE) apply one patch daily for 2 weeks NICODERM CQ 7 MG/24HR PT24 (NICOTINE) apply one pach daily for 2 weeks , start 02/25 2011 TYLOX 5-500 MG CAPS (OXYCODONE-ACETAMINOPHEN) Take 1 tablet by mouth two times a day  Current Allergies (reviewed today): ! SULFA ! PCN Past History:  Past medical, surgical, family and social histories (including risk factors) reviewed, and no changes noted (except as noted below).  Past Medical History: Reviewed history from 11/29/2009 and no changes required. Baker's cyst Pulmonary lesion on RUL on CT 1/03 Hemorrhoids Zoster 2001 Asthma GERD HIV disease     M184V 04-13-09 Hypertension ? 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 Right breast lumpectomy TAH and BSO Cholecystectomy  Left knee arthroscopy  Current Medications (verified): 1)  Ambien 10 Mg Tabs (Zolpidem Tartrate) .... Take 1 Tablet By Mouth At Bedtime As Needed 2)  Hydroxyzine Hcl 25 Mg Tabs (Hydroxyzine Hcl) .... As Needed 3)  Nexium 40 Mg Cpdr (Esomeprazole Magnesium) .... Take 1 Capsule By Mouth Once A Day  4)  Trizivir 300-150-300 Mg Tabs (Abacavir-Lamivudine-Zidovudine) .... Take 1 Tablet By Mouth Twice A Day 5)  Verapamil Hcl Cr 240 Mg Tbcr (Verapamil Hcl) .... Take 1 Tablet By Mouth Once A Day 6)  Qvar 40 Mcg/act Aers (Beclomethasone Dipropionate) .... 2 Puffs Once Daily 7)  Xanax 1 Mg  Tabs (Alprazolam) .... Take 1 Tablet By Mouth Two Times A Day As Needed 8)   Promethazine Hcl 25 Mg Tabs (Promethazine Hcl) .... Take 1 Tablet By Mouth Two Times A Day As Needed 9)  Ventolin Hfa 108 (90 Base) Mcg/act Aers (Albuterol Sulfate) .... 2 Puffs Every 4-6 Hrs As Needed 10)  Diphenoxylate-Atropine 2.5-0.025 Mg Tabs (Diphenoxylate-Atropine) .... One Tab By Mouth Once Daily Prn 11)  Norvir 100 Mg Caps (Ritonavir) .... Take 1 Tablet By Mouth Once A Day 12)  Prezista 400 Mg Tabs (Darunavir Ethanolate) .... 2 Tab Once Daily 13)  Gabapentin 300 Mg Caps (Gabapentin) .... One Cap By Mouth At Bedtime 14)  Nicoderm Cq 14 Mg/24hr Pt24 (Nicotine) .... Apply One Patch Daily For 2 Weeks 15)  Nicoderm Cq 7 Mg/24hr Pt24 (Nicotine) .... Apply One Pach Daily For 2 Weeks , Start 02/25 2011 16)  Tylox 5-500 Mg Caps (Oxycodone-Acetaminophen) .... Take 1 Tablet By Mouth Two Times A Day  Allergies: 1)  ! Sulfa 2)  ! Pcn   Family History: Reviewed history from 06/22/2008 and no changes required. Family History Diabetes 1st degree relative Family History Hypertension MOM  DECEASED  72  HTM, DM DAD DECEASED  70  DM SISTERS 1 living, 2 deceased cancer liver Brother x1 deceased in his 36's diabetes  Social History: Reviewed history from 06/22/2008 and no changes required. Current Smoker Alcohol use-yes Drug use-yes, marijuana BUSINESS MANAGEMENT  in school SEPERATED No children  Review of Systems       wtup 3#. having paresthesias on L arm. numbness. states she is going to [facility] to have breast reduction surgery. going to see psychiatrist.   Vital Signs:  Patient profile:   50 year old female Menstrual status:  hysterectomy Height:      64 inches (162.56 cm) Weight:      197.8 pounds (89.91 kg) BMI:     34.08 Temp:     97.2 degrees F (36.22 degrees C) oral Pulse rate:   97 / minute BP sitting:   130 / 106  (left arm)  Vitals Entered By: Baxter Hire) (January 09, 2010 9:28 AM) CC: 1 month follow up Pain Assessment Patient in pain? yes     Location: left  leg Intensity: 5 Type: aching Onset of pain  Constant Nutritional Status BMI of > 30 = obese Nutritional Status Detail appetite is good per patient  Have you ever been in a relationship where you felt threatened, hurt or afraid?No   Does patient need assistance? Functional Status Self care Ambulation Normal        Medication Adherence: 01/09/2010   Adherence to medications reviewed with patient. Counseling to provide adequate adherence provided   Prevention For Positives: 01/09/2010   Safe sex practices discussed with patient. Condoms offered.                             Physical Exam  General:  well-developed, well-nourished, well-hydrated, and overweight-appearing.   Eyes:  pupils equal, pupils round, and pupils reactive to light.   Mouth:  pharynx pink and moist and no exudates.   Neck:  no  masses.   Lungs:  normal respiratory effort and normal breath sounds.   Heart:  normal rate, regular rhythm, and no murmur.   Abdomen:  soft, non-tender, and normal bowel sounds.   Psych:  she is in her usual state of elavation, rapid speech,    Impression & Recommendations:  Problem # 1:  HIV DISEASE (ICD-042) she is doing well. encouraged her to take all her meds. she has condoms.  The following medications were removed from the medication list:    Fluconazole 150 Mg Tabs (Fluconazole) ..... One tab by mouth  Problem # 2:  ABNORMAL PAP SMEAR, LGSIL (ICD-795.09) going next month to Gyn.   Problem # 3:  DEPRESSION (ICD-311) she is seeing psychiatrist. will cont to follow.  Her updated medication list for this problem includes:    Hydroxyzine Hcl 25 Mg Tabs (Hydroxyzine hcl) .Marland Kitchen... As needed    Xanax 1 Mg Tabs (Alprazolam) .Marland Kitchen... Take 1 tablet by mouth two times a day as needed  Problem # 4:  TOBACCO USER (ICD-305.1) encouraged to quit.  Her updated medication list for this problem includes:    Nicoderm Cq 14 Mg/24hr Pt24 (Nicotine) .Marland Kitchen... Apply one patch daily for 2  weeks    Nicoderm Cq 7 Mg/24hr Pt24 (Nicotine) .Marland Kitchen... Apply one pach daily for 2 weeks , start 02/25 2011  Problem # 5:  HYPERTENSION (ICD-401.9) will f/u wth Dr Lodema Hong. greatlyappreciate her partnering her with [place].  may need to consider lipid lowering agent.  Her updated medication list for this problem includes:    Verapamil Hcl Cr 240 Mg Tbcr (Verapamil hcl) .Marland Kitchen... Take 1 tablet by mouth once a day  Other Orders: Est. Patient Level IV ([address]) Future Orders: T-CD4SP ([facility]) (CD4SP) ... 04/09/2010 T-HIV Viral Load [id number]) ... 04/09/2010 T-Comprehensive Metabolic Panel [id number]) ... 04/09/2010 T-CBC w/Diff ([id number]) ... 04/09/2010  Prescriptions: NORVIR 100 MG CAPS (RITONAVIR) Take 1 tablet by mouth once a day  #90 x 3   Entered and Authorized by:   Johny Sax MD   Signed by:   Johny Sax MD on 01/09/2010   Method used:   Electronically to        [facility] (retail)       [address]       [place], [place]  [address]       Ph: [phone]       Fax: [phone]   RxID:   [id number]  Process Orders Check Orders Results:     [facility]: Check successful Tests Sent for requisitioning (January 09, 2010 10:17 AM):     04/09/2010: [facility] -- T-HIV Viral Load [id number] (signed)     04/09/2010: [facility] -- T-Comprehensive Metabolic Panel [[id number]] (signed)     04/09/2010: [facility] -- [facility] w/Diff [[id number]] (signed)

## 2010-11-21 NOTE — Assessment & Plan Note (Signed)
Summary: nausea and constipation - room 2   Vital Signs:  Patient profile:   50 year old female Menstrual status:  hysterectomy Height:      64 inches Weight:      194 pounds BMI:     33.42 O2 Sat:      90 % on Room air Pulse rate:   112 / minute Resp:     16 per minute BP sitting:   110 / 82  (left arm)  Vitals Entered By: Adella Hare LPN (December 19, 2009 9:29 AM) CC: nausea and constipation, numbness on left side sometimes Is Patient Diabetic? No Pain Assessment Patient in pain? no        Primary Provider:  [name]  CC:  nausea and constipation and numbness on left side sometimes.  History of Present Illness: Pt has a hx of constipation.  States it has worse again in the last couple of weeks.  She is using Lactulose q 3 days &this is the only time she has a BM.  She denies current nausea or vomiting.  Has a hx of nausea though, & has Phenergan at home if needed.  Pt also has Miralax at home.  Is uncertain if she is suppose to be taking this or, if she should increase her Lactulose.  No fever or chills.    Pt reports intermittent numbness/tingling the Lt side of her body. No change. Was seeing orthopedic Dr regarding herniated discs in her back & was scheduled for EMG but had to cancel because was in the hosp with pneumonia.  Pt states she has the phone number to call to resched.   Current Medications (verified): 1)  Ambien 10 Mg Tabs (Zolpidem Tartrate) .... Take 1 Tablet By Mouth At Bedtime As Needed 2)  Hydroxyzine Hcl 25 Mg Tabs (Hydroxyzine Hcl) .... As Needed 3)  Nexium 40 Mg Cpdr (Esomeprazole Magnesium) .... Take 1 Capsule By Mouth Once A Day 4)  Trizivir 300-150-300 Mg Tabs (Abacavir-Lamivudine-Zidovudine) .... Take 1 Tablet By Mouth Twice A Day 5)  Verapamil Hcl Cr 240 Mg Tbcr (Verapamil Hcl) .... Take 1 Tablet By Mouth Once A Day 6)  Qvar 40 Mcg/act Aers (Beclomethasone Dipropionate) .... 2 Puffs Once Daily 7)  Xanax 1 Mg  Tabs (Alprazolam) ....  Take 1 Tablet By Mouth Two Times A Day As Needed 8)  Promethazine Hcl 25 Mg Tabs (Promethazine Hcl) .... Take 1 Tablet By Mouth Two Times A Day As Needed 9)  Duoneb 0.5-2.5 (3) Mg/26ml Soln (Ipratropium-Albuterol) .... One Vial Per Nebulizer Three Times A Day Prn 10)  Ventolin Hfa 108 (90 Base) Mcg/act Aers (Albuterol Sulfate) .... 2 Puffs Every 4-6 Hrs As Needed 11)  Diphenoxylate-Atropine 2.5-0.025 Mg Tabs (Diphenoxylate-Atropine) .... One Tab By Mouth Once Daily Prn 12)  Flonase 50 Mcg/act Susp (Fluticasone Propionate) .... As Needed 13)  Norvir 100 Mg Caps (Ritonavir) .... Take 1 Tablet By Mouth Once A Day 14)  Prezista 400 Mg Tabs (Darunavir Ethanolate) .... 2 Tab Once Daily 15)  Allegra 60 Mg Tabs (Fexofenadine Hcl) .... One Tab By Mouth Qd 16)  Diclofenac Potassium 50 Mg Tabs (Diclofenac Potassium) .... One and Half Tabs By Mouth Two Times A Day 17)  Gabapentin 300 Mg Caps (Gabapentin) .... One Cap By Mouth At Bedtime 18)  Prednisone 10 Mg Tabs (Prednisone) .... Uad 19)  Nicoderm Cq 14 Mg/24hr Pt24 (Nicotine) .... Apply One Patch Daily For 2 Weeks 20)  Nicoderm Cq 7 Mg/24hr Pt24 (Nicotine) .... Apply  One Pach Daily For 2 Weeks , Start 02/25 2011 21)  Tylox 5-500 Mg Caps (Oxycodone-Acetaminophen) .... Take 1 Tablet By Mouth Two Times A Day 22)  Fluconazole 150 Mg Tabs (Fluconazole) .... One Tab By Mouth  Allergies (verified): 1)  ! Sulfa 2)  ! Septra 3)  ! Pcn 4)  ! * Contrast Dye  Past History:  Past medical history reviewed for relevance to current acute and chronic problems.  Past Medical History: Reviewed history from 11/29/2009 and no changes required. Baker's cyst Pulmonary lesion on RUL on CT 1/03 Hemorrhoids Zoster 2001 Asthma GERD HIV disease     [id number] 04-13-09 Hypertension ? PCP 11-2009     FAMILY HISTORY DIABETES 1ST DEGREE RELATIVE (ICD-V18.0) TOBACCO USER (ICD-305.1)  ABNORMAL PAP SMEAR, LGSIL (ICD-795.09) HERPES ZOSTER (ICD-053.9) HEMORRHOIDS  (ICD-455.6) BAKER'S CYST (ICD-727.51) PANCREATITIS, HX OF (ICD-V12.70) HYPERTENSION (ICD-401.9) GERD (ICD-530.81) ASTHMA (ICD-493.90)  Review of Systems General:  Denies chills and fever. ENT:  Denies earache, sinus pressure, and sore throat. CV:  Denies chest pain or discomfort. Resp:  Denies cough and shortness of breath. GI:  Complains of abdominal pain and constipation; denies bloody stools, diarrhea, indigestion, nausea, and vomiting. Neuro:  Complains of numbness and tingling. Heme:  Denies enlarge lymph nodes and fevers.  Physical Exam  General:  Well-developed,well-nourished,in no acute distress; alert,appropriate and cooperative throughout examination Head:  Normocephalic and atraumatic without obvious abnormalities. No apparent alopecia or balding. Ears:  External ear exam shows no significant lesions or deformities.  Otoscopic examination reveals clear canals, tympanic membranes are intact bilaterally without bulging, retraction, inflammation or discharge. Hearing is grossly normal bilaterally. Nose:  External nasal examination shows no deformity or inflammation. Nasal mucosa are pink and moist without lesions or exudates. Mouth:  Oral mucosa and oropharynx without lesions or exudates.  Teeth in good repair. Neck:  No deformities, masses, or tenderness noted. Lungs:  Normal respiratory effort, chest expands symmetrically. Lungs are clear to auscultation, no crackles or wheezes. Heart:  Normal rate and regular rhythm. S1 and S2 normal without gallop, murmur, click, rub or other extra sounds. Abdomen:  Bowel sounds positive,abdomen soft and non-tender without masses, organomegaly or hernias noted. Cervical Nodes:  No lymphadenopathy noted Psych:  Cognition and judgment appear intact. Alert and cooperative with normal attention span and concentration. No apparent delusions, illusions, hallucinations   Impression & Recommendations:  Problem # 1:  CONSTIPATION, INTERMITTENT  (ICD-564.00) Assessment Deteriorated  Increase water intake.  Restart Miralax daily.  Continue Lactulose q 3 d as needed.  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 Reschedule EMG as discussed.  Problem # 3:  HYPERTENSION (ICD-401.9) Assessment: Improved  Her updated medication list for this problem includes:    Verapamil Hcl Cr 240 Mg Tbcr (Verapamil hcl) .Marland Kitchen... Take 1 tablet by mouth once a day  BP today: 110/82 Prior BP: 130/100 (11/29/2009)  Labs Reviewed: K+: 4.5 (12/07/2009) Creat: : 0.86 (12/07/2009)   Chol: 262 (12/07/2009)   HDL: 66 (12/07/2009)   LDL: * mg/dL (74/25/9563)   TG: 875 (12/07/2009)  Complete Medication List: 1)  Ambien 10 Mg Tabs (Zolpidem tartrate) .... Take 1 tablet by mouth at bedtime as needed 2)  Hydroxyzine Hcl 25 Mg Tabs (Hydroxyzine hcl) .... As needed 3)  Nexium 40 Mg Cpdr (Esomeprazole magnesium) .... Take 1 capsule by mouth once a day 4)  Trizivir 300-150-300 Mg Tabs (Abacavir-lamivudine-zidovudine) .... Take 1 tablet by mouth twice a day 5)  Verapamil  Hcl Cr 240 Mg Tbcr (Verapamil hcl) .... Take 1 tablet by mouth once a day 6)  Qvar 40 Mcg/act Aers (Beclomethasone dipropionate) .... 2 puffs once daily 7)  Xanax 1 Mg Tabs (Alprazolam) .... Take 1 tablet by mouth two times a day as needed 8)  Promethazine Hcl 25 Mg Tabs (Promethazine hcl) .... Take 1 tablet by mouth two times a day as needed 9)  Duoneb 0.5-2.5 (3) Mg/75ml Soln (Ipratropium-albuterol) .... One vial per nebulizer three times a day prn 10)  Ventolin Hfa 108 (90 Base) Mcg/act Aers (Albuterol sulfate) .... 2 puffs every 4-6 hrs as needed 11)  Diphenoxylate-atropine 2.5-0.025 Mg Tabs (Diphenoxylate-atropine) .... One tab by mouth once daily prn 12)  Flonase 50 Mcg/act Susp (Fluticasone propionate) .... As needed 13)  Norvir 100 Mg Caps (Ritonavir) .... Take 1 tablet by mouth once a day 14)  Prezista 400 Mg Tabs  (Darunavir ethanolate) .... 2 tab once daily 15)  Allegra 60 Mg Tabs (Fexofenadine hcl) .... One tab by mouth qd 16)  Diclofenac Potassium 50 Mg Tabs (Diclofenac potassium) .... One and half tabs by mouth two times a day 17)  Gabapentin 300 Mg Caps (Gabapentin) .... One cap by mouth at bedtime 18)  Prednisone 10 Mg Tabs (Prednisone) .... Uad 19)  Nicoderm Cq 14 Mg/24hr Pt24 (Nicotine) .... Apply one patch daily for 2 weeks 20)  Nicoderm Cq 7 Mg/24hr Pt24 (Nicotine) .... Apply one pach daily for 2 weeks , start 02/25 2011 21)  Tylox 5-500 Mg Caps (Oxycodone-acetaminophen) .... Take 1 tablet by mouth two times a day 22)  Fluconazole 150 Mg Tabs (Fluconazole) .... One tab by mouth  Patient Instructions: 1)  Keep appt withDr Lodema Hong  2)  Restart Miralax once daily.  Continue Laculose every 3 days as needed. 3)  Call to reschedule your "nerve" test.

## 2010-11-21 NOTE — Letter (Signed)
Summary: external other  external other   Imported By: Curtis Sites 06/13/2010 08:35:04  _____________________________________________________________________  External Attachment:    Type:   Image     Comment:   External Document

## 2010-11-21 NOTE — Miscellaneous (Signed)
Clinical Lists Changes  Problems: Added new problem of UNSPECIFIED ESSENTIAL HYPERTENSION (ICD-401.9) Assessed OBESITY, UNSPECIFIED as deteriorated -  Ht: 64 (04/05/2010)   Wt: 203.25 (03/06/2010)   BMI: 35.01 (03/06/2010)  Assessed GYNECOMASTIA as unchanged - pt unable to get assistance for breast reduction Assessed DEPRESSION as improved -  Her updated medication list for this problem includes:    Hydroxyzine Hcl 25 Mg Tabs (Hydroxyzine hcl) .Marland Kitchen... As needed    Xanax 1 Mg Tabs (Alprazolam) .Marland Kitchen... Take 1 tablet by mouth two times a day as needed  Assessed GERD as deteriorated -  Her updated medication list for this problem includes:    Nexium 40 Mg Cpdr (Esomeprazole magnesium) .Marland Kitchen... Take 1 capsule by mouth once a day  Assessed UNSPECIFIED ESSENTIAL HYPERTENSION as unchanged -  Her updated medication list for this problem includes:    Verapamil Hcl Cr 240 Mg Tbcr (Verapamil hcl) .Marland Kitchen... Take 1 tablet by mouth once a day    Clonidine Hcl 0.1 Mg Tabs (Clonidine hcl) .Marland Kitchen... Take 1 tab by mouth at bedtime  Prior BP: 120/89 (04/05/2010)  Labs Reviewed: K+: 3.6 (04/05/2010) Creat: : 0.64 (04/05/2010)   Chol: 262 (12/07/2009)   HDL: 66 (12/07/2009)   LDL: * mg/dL (30/86/5784)   TG: 696 (12/07/2009)  Observations: Added new observation of INSTRUCTIONS: Please schedule a follow-up appointment in 4 months.  no med changes (04/09/2010 2:37) Added new observation of PEADULT: Veronica Overman MD ~General`Gen appear (04/09/2010 2:37) Added new observation of GEN APPEAR: Well-developed,well-nourished,in no acute distress; alert,appropriate and cooperative throughout examination HEENT: No facial asymmetry,  EOMI, No sinus tenderness, TM's Clear, oropharynx  pink and moist.   Chest: Clear to auscultation bilaterally.  CVS: S1, S2, No murmurs, No S3.   Abd: Soft, Nontender.  Veronica: Adequate ROM spine, hips, shoulders and knees.  Ext: No edema.   CNS: CN 2-12 intact, power tone and sensation normal  throughout.   Skin: Intact, no visible lesions or rashes.  Psych: Good eye contact, normal affect. mildly  anxious, but not depressed appearing.  (04/09/2010 2:37) Added new observation of EXB:MWUXLKG: Complains of fatigue; Denies chills, fever (04/09/2010 2:37) Added new observation of ROS EYES: Denies eye pain, red eye (04/09/2010 2:37) Added new observation of ROS ENT: Complains of postnasal drainage; Denies hoarseness, nasal congestion, sinus pressure (04/09/2010 2:37) Added new observation of ROS: CARDIAC: Complains of shortness of breath with exertion; Denies chest pain or discomfort, palpitations, swelling of feet (04/09/2010 2:37) Added new observation of MWN:UUVOZD: Complains of cough, shortness of breath; Denies sputum productive, wheezing; Others: smoker's cough (04/09/2010 2:37) Added new observation of ROS: GI: Complains of abdominal pain; Denies constipation, diarrhea, nausea, vomiting (04/09/2010 2:37) Added new observation of ROS: GU: Denies dysuria, hematuria, incontinence (04/09/2010 2:37) Added new observation of GUY:QIHKVQQV: Complains of joint pain, low back pain, mid back pain, stiffness (04/09/2010 2:37) Added new observation of ROS SKIN: Denies itching, lesion(s), rash (04/09/2010 2:37) Added new observation of ROS: NEURO: Complains of headaches, memory loss; Denies seizures, sensation of room spinning (04/09/2010 2:37) Added new observation of ROS: PSYCH: Complains of anxiety, depression; Denies suicidal thoughts/plans, thoughts of violence, unusual visions or sounds; Others: improved (04/09/2010 2:37) Added new observation of ROS ENDO: Denies cold intolerance, excessive hunger, excessive thirst, polyuria (04/09/2010 2:37) Added new observation of ROS HEME: Denies abnormal bruising, bleeding (04/09/2010 2:37) Added new observation of ROS ALLERG: Complains of seasonal allergies (04/09/2010 2:37) Added new observation of HPI: [name] reports she has been in fair health. he  is mpotivated to start an exercise program and would like permissio to do so, f/u with the iiD specialist reported hat this was no problem, and a letter to the [facility] states this. She has hd no recent fevr or chills. She denies excessive nasal congestion, post nsal drainage , sore throat or productive couh.She denies chest pain, aplpitations, pND or orthopnea, or leg swelling. 'She did have a recent episode of abdominal pain , central after eating , with some difficulty swalllowing, this has rsolved. She denies uncontrolled depression or anxiety and is seen regularly by mental health. She unfortunately continues to gain an excessive amt of weight , and she also still smokes.  (04/09/2010 2:37) Added new observation of PRIMARY MD: Veronica Overman MD (04/09/2010 2:37)       Primary Care Provider:  [name]   History of Present Illness: [name] reports she has been in fair health. he is mpotivated to start an exercise program and would like permissio to do so, f/u with the iiD specialist reported hat this was no problem, and a letter to the [employer] states this. She has hd no recent fevr or chills. She denies excessive nasal congestion, post nsal drainage , sore throat or productive couh.She denies chest pain, aplpitations, pND or orthopnea, or leg swelling. 'She did have a recent episode of abdominal pain , central after eating , with some difficulty swalllowing, this has rsolved. She denies uncontrolled depression or anxiety and is seen regularly by mental health. She unfortunately continues to gain an excessive amt of weight , and she also still smokes.    Review of Systems      See HPI General:  Complains of fatigue; denies chills and fever. Eyes:  Denies eye pain and red eye. ENT:  Complains of postnasal drainage; denies hoarseness, nasal congestion, and sinus pressure. CV:  Complains of shortness of breath with exertion; denies chest pain or discomfort, palpitations, and  swelling of feet. Resp:  Complains of cough and shortness of breath; denies sputum productive and wheezing; smoker's cough. GI:  Complains of abdominal pain; denies constipation, diarrhea, nausea, and vomiting. GU:  Denies dysuria, hematuria, and incontinence. Veronica:  Complains of joint pain, low back pain, mid back pain, and stiffness. Derm:  Denies itching, lesion(s), and rash. Neuro:  Complains of headaches and memory loss; denies seizures and sensation of room spinning. Psych:  Complains of anxiety and depression; denies suicidal thoughts/plans, thoughts of violence, and unusual visions or sounds; improved. Endo:  Denies cold intolerance, excessive hunger, excessive thirst, and polyuria. Heme:  Denies abnormal bruising and bleeding. Allergy:  Complains of seasonal allergies.   Impression & Recommendations:  Problem # 1:  OBESITY, UNSPECIFIED (ICD-278.00) Assessment Deteriorated  Ht: 64 (04/05/2010)   Wt: 203.25 (03/06/2010)   BMI: 35.01 (03/06/2010)  Problem # 2:  BACK PAIN WITH RADICULOPATHY (ICD-729.2)  Problem # 3:  GYNECOMASTIA (ICD-611.1) Assessment: Unchanged pt unable to get assistance for breast reduction  Problem # 4:  DEPRESSION (ICD-311) Assessment: Improved  Her updated medication list for this problem includes:    Hydroxyzine Hcl 25 Mg Tabs (Hydroxyzine hcl) .Marland Kitchen... As needed    Xanax 1 Mg Tabs (Alprazolam) .Marland Kitchen... Take 1 tablet by mouth two times a day as needed  Problem # 5:  GERD (ICD-530.81) Assessment: Deteriorated  Her updated medication list for this problem includes:    Nexium 40 Mg Cpdr (Esomeprazole magnesium) .Marland Kitchen... Take 1 capsule by mouth once a day  Problem # 6:  UNSPECIFIED ESSENTIAL  HYPERTENSION (ICD-401.9) Assessment: Unchanged  Her updated medication list for this problem includes:    Verapamil Hcl Cr 240 Mg Tbcr (Verapamil hcl) .Marland Kitchen... Take 1 tablet by mouth once a day    Clonidine Hcl 0.1 Mg Tabs (Clonidine hcl) .Marland Kitchen... Take 1 tab by mouth at  bedtime  Prior BP: 120/89 (04/05/2010)  Labs Reviewed: K+: 3.6 (04/05/2010) Creat: : 0.64 (04/05/2010)   Chol: 262 (12/07/2009)   HDL: 66 (12/07/2009)   LDL: * mg/dL (16/07/9603)   TG: 540 (12/07/2009)  Complete Medication List: 1)  Ambien 10 Mg Tabs (Zolpidem tartrate) .... Take 1 tablet by mouth at bedtime as needed 2)  Hydroxyzine Hcl 25 Mg Tabs (Hydroxyzine hcl) .... As needed 3)  Nexium 40 Mg Cpdr (Esomeprazole magnesium) .... Take 1 capsule by mouth once a day 4)  Trizivir 300-150-300 Mg Tabs (Abacavir-lamivudine-zidovudine) .... Take 1 tablet by mouth twice a day 5)  Verapamil Hcl Cr 240 Mg Tbcr (Verapamil hcl) .... Take 1 tablet by mouth once a day 6)  Qvar 40 Mcg/act Aers (Beclomethasone dipropionate) .... 2 puffs two times a day 7)  Xanax 1 Mg Tabs (Alprazolam) .... Take 1 tablet by mouth two times a day as needed 8)  Promethazine Hcl 25 Mg Tabs (Promethazine hcl) .... Take 1 tablet by mouth two times a day as needed 9)  Ventolin Hfa 108 (90 Base) Mcg/act Aers (Albuterol sulfate) .... 2 puffs every 4-6 hrs as needed 10)  Diphenoxylate-atropine 2.5-0.025 Mg Tabs (Diphenoxylate-atropine) .... One tab by mouth once daily prn 11)  Norvir 100 Mg Tabs (Ritonavir) .... Take 1 tablet by mouth once a day 12)  Prezista 400 Mg Tabs (Darunavir ethanolate) .... 2 tab once daily 13)  Gabapentin 300 Mg Caps (Gabapentin) .... One cap by mouth at bedtime 14)  Tylox 5-500 Mg Caps (Oxycodone-acetaminophen) .... Take 1 tablet by mouth two times a day 15)  Clonidine Hcl 0.1 Mg Tabs (Clonidine hcl) .... Take 1 tab by mouth at bedtime 16)  Seroquel 100 Mg Tabs (Quetiapine fumarate) .... Take one tablet by mouth at bedtime.   Patient Instructions: 1)  Please schedule a follow-up appointment in 4 months. 2)  no med changes   Physical Exam  General:  Well-developed,well-nourished,in no acute distress; alert,appropriate and cooperative throughout examination HEENT: No facial asymmetry,  EOMI, No  sinus tenderness, TM's Clear, oropharynx  pink and moist.   Chest: Clear to auscultation bilaterally.  CVS: S1, S2, No murmurs, No S3.   Abd: Soft, Nontender.  Veronica: Adequate ROM spine, hips, shoulders and knees.  Ext: No edema.   CNS: CN 2-12 intact, power tone and sensation normal throughout.   Skin: Intact, no visible lesions or rashes.  Psych: Good eye contact, normal affect. mildly  anxious, but not depressed appearing.

## 2010-11-21 NOTE — Medication Information (Signed)
Summary: [employer]   Imported By: Lind Guest 12/12/2009 11:34:39  _____________________________________________________________________  External Attachment:    Type:   Image     Comment:   External Document

## 2010-11-21 NOTE — Assessment & Plan Note (Signed)
Summary: office visit-feeling bad Room 2   Vital Signs:  Patient profile:   50 year old female Menstrual status:  hysterectomy Height:      64 inches Weight:      196.75 pounds BMI:     33.89 O2 Sat:      94 % Pulse rate:   88 / minute Resp:     16 per minute BP sitting:   120 / 94  (left arm)  Vitals Entered By: Everitt Amber LPN (June 29, 2010 10:47 AM) CC: feeling weak, lightheaded, chest pain/nausea/vomiting, she lost her nexium and its too early to fill again so she needs something else in the place of it until she can fill them again   Primary Provider:  [name]  CC:  feeling weak, lightheaded, chest pain/nausea/vomiting, and she lost her nexium and its too early to fill again so she needs something else in the place of it until she can fill them again.  History of Present Illness: Pt c/o nasal congestion, post nasal drainage, sinus pressure and cough x  4  days.  Cough is nonproductive.  Has a sore throat but no ear pain.  No fever or chills.  Has not tried any over the counter cough and cold meds.  Symptoms are persistent but not worsening.  She also reports dizziness.  Pt lost her Nexium one week ago.  Is having increased indigestion syptoms without it.  Taking over the counter Zantac and no help.  Tums also "not doing it."  Has also been having some lose stools.  No blood or melena.  Pt is hoping that I could prescription another PPI for her until she can get the Nexium refilled.  She has tried Omeprazole and Pantoprazole in the past and did not work as well at the Nexium.    Allergies (verified): 1)  ! Sulfa 2)  ! Pcn  Past History:  Past medical history reviewed for relevance to current acute and chronic problems.  Past Medical History: Reviewed history from 11/29/2009 and no changes required. Baker's cyst Pulmonary lesion on RUL on CT 1/03 Hemorrhoids Zoster 2001 Asthma GERD HIV disease     M184V 04-13-09 Hypertension ? PCP  11-2009     FAMILY HISTORY DIABETES 1ST DEGREE RELATIVE (ICD-V18.0) TOBACCO USER (ICD-305.1)  ABNORMAL PAP SMEAR, LGSIL (ICD-795.09) HERPES ZOSTER (ICD-053.9) HEMORRHOIDS (ICD-455.6) BAKER'S CYST (ICD-727.51) PANCREATITIS, HX OF (ICD-V12.70) HYPERTENSION (ICD-401.9) GERD (ICD-530.81) ASTHMA (ICD-493.90)  Review of Systems General:  Denies chills and fever. ENT:  Complains of nasal congestion, postnasal drainage, sinus pressure, and sore throat; denies earache. CV:  Denies chest pain or discomfort and palpitations. Resp:  Complains of cough; denies shortness of breath and sputum productive. GI:  Complains of diarrhea and nausea; denies abdominal pain, bloody stools, dark tarry stools, and vomiting. GU:  Denies dysuria, urinary frequency, and urinary hesitancy. Allergy:  Complains of seasonal allergies; denies sneezing.  Physical Exam  General:  Well-developed,well-nourished,in no acute distress; alert,appropriate and cooperative throughout examination Head:  Normocephalic and atraumatic without obvious abnormalities. No apparent alopecia or balding. Ears:  External ear exam shows no significant lesions or deformities.  Otoscopic examination reveals clear canals, tympanic membranes are intact bilaterally without bulging, retraction, inflammation or discharge. Hearing is grossly normal bilaterally. Nose:  External nasal examination shows no deformity or inflammation. Nasal mucosa are pink and moist without lesions or exudates.L maxillary sinus tenderness and R maxillary sinus tenderness.   Mouth:  Oral mucosa and oropharynx without lesions or  exudates.   Neck:  No deformities, masses, or tenderness noted. Lungs:  Normal respiratory effort, chest expands symmetrically. Lungs are clear to auscultation, no crackles or wheezes. Heart:  Normal rate and regular rhythm. S1 and S2 normal without gallop, murmur, click, rub or other extra sounds. Abdomen:  Bowel sounds positive,abdomen soft and  non-tender without masses, organomegaly or hernias noted. Cervical Nodes:  No lymphadenopathy noted Psych:  Cognition and judgment appear intact. Alert and cooperative with normal attention span and concentration. No apparent delusions, illusions, hallucinations   Impression & Recommendations:  Problem # 1:  SINUSITIS, ACUTE (ICD-461.9) Assessment New  Her updated medication list for this problem includes:    Doxycycline Hyclate 100 Mg Tabs (Doxycycline hyclate) .Marland Kitchen... Take 1 two times a day x 10 days    Tessalon 200 Mg Caps (Benzonatate) .Marland Kitchen... Take 1 every 8 hrs as needed for cough  Problem # 2:  GERD (ICD-530.81) Assessment: Deteriorated Discussed with pt that all other prescription PPIs require a prior auth and that her ins is not going to [place] one because she has lost the Nexium.  She may use an over the counter product such as Omeprazole or Zantac and use Tums as needed in addition. Her updated medication list for this problem includes:    Nexium 40 Mg Cpdr (Esomeprazole magnesium) .Marland Kitchen... Take 1 capsule by mouth once a day  Complete Medication List: 1)  Ambien 10 Mg Tabs (Zolpidem tartrate) .... Take 1 tablet by mouth at bedtime as needed 2)  Hydroxyzine Hcl 25 Mg Tabs (Hydroxyzine hcl) .... As needed 3)  Nexium 40 Mg Cpdr (Esomeprazole magnesium) .... Take 1 capsule by mouth once a day 4)  Trizivir 300-150-300 Mg Tabs (Abacavir-lamivudine-zidovudine) .... Take 1 tablet by mouth twice a day 5)  Verapamil Hcl Cr 240 Mg Tbcr (Verapamil hcl) .... Take 1 tablet by mouth once a day 6)  Qvar 40 Mcg/act Aers (Beclomethasone dipropionate) .... 2 puffs two times a day 7)  Xanax 1 Mg Tabs (Alprazolam) .... Take 1 tablet by mouth two times a day as needed 8)  Promethazine Hcl 25 Mg Tabs (Promethazine hcl) .... Take 1 tablet by mouth two times a day as needed 9)  Ventolin Hfa 108 (90 Base) Mcg/act Aers (Albuterol sulfate) .... 2 puffs every 4-6 hrs as needed 10)  Diphenoxylate-atropine 2.5-0.025  Mg Tabs (Diphenoxylate-atropine) .... One tab by mouth once daily prn 11)  Norvir 100 Mg Tabs (Ritonavir) .... Take 1 tablet by mouth once a day 12)  Prezista 400 Mg Tabs (Darunavir ethanolate) .... 2 tab once daily 13)  Gabapentin 300 Mg Caps (Gabapentin) .... One cap by mouth at bedtime 14)  Clonidine Hcl 0.1 Mg Tabs (Clonidine hcl) .... Take 1 tab by mouth at bedtime 15)  Seroquel 100 Mg Tabs (Quetiapine fumarate) .... Take one tablet by mouth at bedtime. 16)  Tylox 5-500 Mg Caps (Oxycodone-acetaminophen) .... Take 1 capsule by mouth once a day 17)  Doxycycline Hyclate 100 Mg Tabs (Doxycycline hyclate) .... Take 1 two times a day x 10 days 18)  Tessalon 200 Mg Caps (Benzonatate) .... Take 1 every 8 hrs as needed for cough  Patient Instructions: 1)  Please schedule a follow-up appointment in 1 month.  Recheck next week if you do not improve and sooner if you worsen. 2)  I have prescribed an antibiotic for you and pills for cough. 3)  I have refilled your nausea medicine. 4)  Drink clear fluids, and eat light bland foods. 5)  you may try over the counter Omeprazole 2 daily for heartburn until your insurance will let you refill your Nexium.  You may also continue using Tums. 6)  You may use Tylenol as needed for aches and pains. Prescriptions: TESSALON 200 MG CAPS (BENZONATATE) take 1 every 8 hrs as needed for cough  #30 x 0   Entered and Authorized by:   Esperanza Sheets PA   Signed by:   Esperanza Sheets PA on 06/29/2010   Method used:   Electronically to        [facility] (retail)       [address]       [place], [place]  [address]       Ph: [phone]       Fax: [phone]   RxID:   [id number] DOXYCYCLINE HYCLATE 100 MG TABS (DOXYCYCLINE HYCLATE) take 1 two times a day x 10 days  #20 x 0   Entered and Authorized by:   Esperanza Sheets PA   Signed by:   Esperanza Sheets PA on 06/29/2010   Method used:   Electronically to        [facility]  (retail)       [address]       [place], [place]  [address]       Ph: [phone]       Fax: [phone]   RxID:   [id number] PROMETHAZINE HCL 25 MG TABS (PROMETHAZINE HCL) Take 1 tablet by mouth two times a day as needed  #20 Each x 0   Entered and Authorized by:   Esperanza Sheets PA   Signed by:   Esperanza Sheets PA on 06/29/2010   Method used:   Electronically to        [facility] (retail)       [address]       [place], [place]  [address]       Ph: [phone]       Fax: [phone]   RxID:   [id number]

## 2010-11-21 NOTE — Assessment & Plan Note (Signed)
Summary: sick- room 2   Vital Signs:  Patient profile:   50 year old female Menstrual status:  hysterectomy Height:      64 inches Weight:      203.25 pounds BMI:     35.01 O2 Sat:      99 % on Room air Pulse rate:   101 / minute Resp:     16 per minute BP sitting:   124 / 90  (left arm)  Vitals Entered By: Adella Hare LPN (Mar 06, 2010 10:17 AM)  Nutrition Counseling: Patient's BMI is greater than 25 and therefore counseled on weight management options. CC: chest congestion, cough Is Patient Diabetic? No Pain Assessment Patient in pain? no      Comments did not bring meds to ov   Primary Provider:  [name]  CC:  chest congestion and cough.  History of Present Illness: Pt states she has a ongoing cough since she was inpt with pneumonia the end of January. cough is worse at HS.  Has been using over the counter cough meds.    Cough is prod, phlegm is clear.  No fever or chills.  Nasal congestion.  Maxillary sinus pressure & pain.  Has post nasal drainage and this makes her cough worse.  Concerned about her wt gain.  "I dont' eat that much."  States she is going to the [facility] today to check into water aerobics.  She knows she needs to exercise, but has a hard time because of chronic LBP.  Allergies (verified): 1)  ! Sulfa 2)  ! Pcn  Past History:  Past medical history reviewed for relevance to current acute and chronic problems.  Past Medical History: Reviewed history from 11/29/2009 and no changes required. Baker's cyst Pulmonary lesion on RUL on CT 1/03 Hemorrhoids Zoster 2001 Asthma GERD HIV disease     M184V 04-13-09 Hypertension ? PCP 11-2009     FAMILY HISTORY DIABETES 1ST DEGREE RELATIVE (ICD-V18.0) TOBACCO USER (ICD-305.1)  ABNORMAL PAP SMEAR, LGSIL (ICD-795.09) HERPES ZOSTER (ICD-053.9) HEMORRHOIDS (ICD-455.6) BAKER'S CYST (ICD-727.51) PANCREATITIS, HX OF (ICD-V12.70) HYPERTENSION (ICD-401.9) GERD (ICD-530.81) ASTHMA  (ICD-493.90)  Review of Systems General:  Denies chills and fever. ENT:  Complains of nasal congestion, postnasal drainage, and sinus pressure; denies ear discharge and sore throat. CV:  Denies chest pain or discomfort. Resp:  Complains of cough and sputum productive; denies shortness of breath and wheezing. MS:  Complains of low back pain.  Physical Exam  General:  Well-developed,well-nourished,in no acute distress; alert,appropriate and cooperative throughout examination Head:  Normocephalic and atraumatic without obvious abnormalities. No apparent alopecia or balding. Ears:  External ear exam shows no significant lesions or deformities.  Otoscopic examination reveals clear canals, tympanic membranes are intact bilaterally without bulging, retraction, inflammation or discharge. Hearing is grossly normal bilaterally. Nose:  no external deformity, mucosal erythema, mucosal edema, L maxillary sinus tenderness, and R maxillary sinus tenderness.   Mouth:  Oral mucosa and oropharynx without lesions or exudates.   Neck:  No deformities, masses, or tenderness noted. Lungs:  Normal respiratory effort, chest expands symmetrically. Lungs are clear to auscultation, no crackles or wheezes. Heart:  Normal rate and regular rhythm. S1 and S2 normal without gallop, murmur, click, rub or other extra sounds. Cervical Nodes:  No lymphadenopathy noted Psych:  Cognition and judgment appear intact. Alert and cooperative with normal attention span and concentration. No apparent delusions, illusions, hallucinations   Impression & Recommendations:  Problem # 1:  SINUSITIS, ACUTE (ICD-461.9) Assessment New  Her updated medication list for this problem includes:    Cefdinir 300 Mg Caps (Cefdinir) .Marland Kitchen... Take 2 caps by mouth once daily for 10 days  Problem # 2:  OBESITY, UNSPECIFIED (ICD-278.00) Assessment: Comment Only Discussed pts eating habits, and typical diet.  Suggested she decrease her carbohydates,  discontinue sodas, and increase her veggies. Also discussed lean meats, and avoid fried foods. Serving size h/o given also.  Ht: 64 (03/06/2010)   Wt: 203.25 (03/06/2010)   BMI: 35.01 (03/06/2010)  Complete Medication List: 1)  Ambien 10 Mg Tabs (Zolpidem tartrate) .... Take 1 tablet by mouth at bedtime as needed 2)  Hydroxyzine Hcl 25 Mg Tabs (Hydroxyzine hcl) .... As needed 3)  Nexium 40 Mg Cpdr (Esomeprazole magnesium) .... Take 1 capsule by mouth once a day 4)  Trizivir 300-150-300 Mg Tabs (Abacavir-lamivudine-zidovudine) .... Take 1 tablet by mouth twice a day 5)  Verapamil Hcl Cr 240 Mg Tbcr (Verapamil hcl) .... Take 1 tablet by mouth once a day 6)  Qvar 40 Mcg/act Aers (Beclomethasone dipropionate) .... 2 puffs once daily 7)  Xanax 1 Mg Tabs (Alprazolam) .... Take 1 tablet by mouth two times a day as needed 8)  Promethazine Hcl 25 Mg Tabs (Promethazine hcl) .... Take 1 tablet by mouth two times a day as needed 9)  Ventolin Hfa 108 (90 Base) Mcg/act Aers (Albuterol sulfate) .... 2 puffs every 4-6 hrs as needed 10)  Diphenoxylate-atropine 2.5-0.025 Mg Tabs (Diphenoxylate-atropine) .... One tab by mouth once daily prn 11)  Norvir 100 Mg Caps (Ritonavir) .... Take 1 tablet by mouth once a day 12)  Prezista 400 Mg Tabs (Darunavir ethanolate) .... 2 tab once daily 13)  Gabapentin 300 Mg Caps (Gabapentin) .... One cap by mouth at bedtime 14)  Nicoderm Cq 14 Mg/24hr Pt24 (Nicotine) .... Apply one patch daily for 2 weeks 15)  Nicoderm Cq 7 Mg/24hr Pt24 (Nicotine) .... Apply one pach daily for 2 weeks , start 02/25 2011 16)  Tylox 5-500 Mg Caps (Oxycodone-acetaminophen) .... Take 1 tablet by mouth two times a day 17)  Cefdinir 300 Mg Caps (Cefdinir) .... Take 2 caps by mouth once daily for 10 days  Patient Instructions: 1)  Keep your next appt with Dr Lodema Hong 2)  I have prescribed an antibiotic for your sinus infection. 3)  Tobacco is very bad for your health and your loved ones! You Should  stop smoking!. 4)  Stop Smoking Tips: Choose a Quit date. Cut down before the Quit date. decide what you will do as a substitute when you feel the urge to smoke(gum,toothpick,exercise). Prescriptions: CEFDINIR 300 MG CAPS (CEFDINIR) take 2 caps by mouth once daily for 10 days  #20 x 0   Entered and Authorized by:   Esperanza Sheets PA   Signed by:   Esperanza Sheets PA on 03/06/2010   Method used:   Electronically to        [facility] (retail)       [address]       [place], [place]  [address]       Ph: [phone]       Fax: [phone]   RxID:   [id number]

## 2010-11-21 NOTE — Assessment & Plan Note (Signed)
Summary: [name]   Primary Provider:  [name]  CC:  follow-up visit.  History of Present Illness: 50 yo F with hx of hospitalization in [facility] , d/c 2-2 with temp 102.9 , confusion, SOB and cough. 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 She was treated with prednisone, clinida and primaquine and imrpoved rapidly. She also had acute ARF (improved with hydration) as well as increased CPK. She had not been taking her DRV prior to arrival. In hospital her CD4 140 was and VL 235.   Feels not well today. Having stomach cramps. Having diarrhea for last 5 days. No more fevers but has had hot and cold chills. eating poorly.   Preventive Screening-Counseling & Management  Alcohol-Tobacco     Alcohol drinks/day: occassionally     Alcohol type: beer/wine     Smoking Status: current     Smoking Cessation Counseling: yes     Packs/Day: <0.25  Caffeine-Diet-Exercise     Caffeine use/day: sodas and coffee occassionally     Does Patient Exercise: no  Safety-Violence-Falls     Seat Belt Use: yes   Updated Prior Medication List: AMBIEN 10 MG TABS (ZOLPIDEM TARTRATE) Take 1 tablet by mouth at bedtime as needed HYDROXYZINE HCL 25 MG TABS (HYDROXYZINE HCL) as needed NEXIUM 40 MG CPDR (ESOMEPRAZOLE MAGNESIUM) Take 1 capsule by mouth once a day TRIZIVIR 300-150-300 MG TABS (ABACAVIR-LAMIVUDINE-ZIDOVUDINE) Take 1 tablet by mouth twice a day VERAPAMIL HCL CR 240 MG TBCR (VERAPAMIL HCL) Take 1 tablet by mouth once a day QVAR 40 MCG/ACT AERS (BECLOMETHASONE DIPROPIONATE) 2 puffs once daily XANAX 1 MG  TABS (ALPRAZOLAM) Take 1 tablet by mouth two times a day as needed SINGULAIR 10 MG  TABS (MONTELUKAST SODIUM) Take 1 tablet by mouth once a day PROMETHAZINE HCL 25 MG TABS (PROMETHAZINE HCL) Take 1 tablet by mouth two times a day as needed DUONEB 0.5-2.5 (3) MG/3ML SOLN (IPRATROPIUM-ALBUTEROL) one vial per nebulizer three times a day prn VENTOLIN HFA 108 (90 BASE) MCG/ACT AERS (ALBUTEROL SULFATE) 2  puffs every 4-6 hrs as needed DIPHENOXYLATE-ATROPINE 2.5-0.025 MG TABS (DIPHENOXYLATE-ATROPINE) one tab by mouth once daily prn TYLOX 5-500 MG CAPS (OXYCODONE-ACETAMINOPHEN) Take 1 tablet by mouth two times a day FLONASE 50 MCG/ACT SUSP (FLUTICASONE PROPIONATE) as needed LACTULOSE 10 GM/15ML SOLN (LACTULOSE) 30 cc every 3 days as needed for constipation NORVIR 100 MG CAPS (RITONAVIR) Take 1 tablet by mouth once a day PREZISTA 400 MG TABS (DARUNAVIR ETHANOLATE) 2 tab once daily RISPERIDONE 0.5 MG TABS (RISPERIDONE) one tab by mouth qd AMITRIPTYLINE HCL 10 MG TABS (AMITRIPTYLINE HCL) one tab by mouth qhs ALLEGRA 60 MG TABS (FEXOFENADINE HCL) one tab by mouth qd DICLOFENAC POTASSIUM 50 MG TABS (DICLOFENAC POTASSIUM) one and half tabs by mouth two times a day GABAPENTIN 300 MG CAPS (GABAPENTIN) one cap by mouth at bedtime CLINDAMYCIN HCL 150 MG CAPS (CLINDAMYCIN HCL) uad PREDNISONE 10 MG TABS (PREDNISONE) uad NICODERM CQ 14 MG/24HR PT24 (NICOTINE) apply one patch daily for 2 weeks NICODERM CQ 7 MG/24HR PT24 (NICOTINE) apply one pach daily for 2 weeks , start 02/25 2011  Current Allergies (reviewed today): ! SULFA ! SEPTRA ! PCN ! * CONTRAST DYE Past History:  Past medical, surgical, family and social histories (including risk factors) reviewed, and no changes noted (except as noted below).  Past Medical History: Reviewed history from 08/17/2009 and no changes required. Baker's cyst Pulmonary lesion on RUL on CT 1/03 Hemorrhoids Zoster 2001 Asthma GERD HIV disease  M184V 04-13-09 Hypertension     FAMILY HISTORY DIABETES 1ST DEGREE RELATIVE (ICD-V18.0) TOBACCO USER (ICD-305.1)  ABNORMAL PAP SMEAR, LGSIL (ICD-795.09) HERPES ZOSTER (ICD-053.9) HEMORRHOIDS (ICD-455.6) BAKER'S CYST (ICD-727.51) PANCREATITIS, HX OF (ICD-V12.70) HYPERTENSION (ICD-401.9) GERD (ICD-530.81) ASTHMA (ICD-493.90)  Past Surgical History: Reviewed history from 06/22/2008 and no changes  required. Right breast lumpectomy TAH and BSO Cholecystectomy  Left knee arthroscopy  Family History: Reviewed history from 06/22/2008 and no changes required. Family History Diabetes 1st degree relative Family History Hypertension MOM  DECEASED  72  HTM, DM DAD DECEASED  70  DM SISTERS 1 living, 2 deceased cancer liver Brother x1 deceased in his 75's diabetes  Social History: Reviewed history from 06/22/2008 and no changes required. Current Smoker Alcohol use-yes Drug use-yes, marijuana BUSINESS MANAGEMENT  in school SEPERATED No children  Current Medications (verified): 1)  Ambien 10 Mg Tabs (Zolpidem Tartrate) .... Take 1 Tablet By Mouth At Bedtime As Needed 2)  Hydroxyzine Hcl 25 Mg Tabs (Hydroxyzine Hcl) .... As Needed 3)  Nexium 40 Mg Cpdr (Esomeprazole Magnesium) .... Take 1 Capsule By Mouth Once A Day 4)  Trizivir 300-150-300 Mg Tabs (Abacavir-Lamivudine-Zidovudine) .... Take 1 Tablet By Mouth Twice A Day 5)  Verapamil Hcl Cr 240 Mg Tbcr (Verapamil Hcl) .... Take 1 Tablet By Mouth Once A Day 6)  Qvar 40 Mcg/act Aers (Beclomethasone Dipropionate) .... 2 Puffs Once Daily 7)  Xanax 1 Mg  Tabs (Alprazolam) .... Take 1 Tablet By Mouth Two Times A Day As Needed 8)  Singulair 10 Mg  Tabs (Montelukast Sodium) .... Take 1 Tablet By Mouth Once A Day 9)  Promethazine Hcl 25 Mg Tabs (Promethazine Hcl) .... Take 1 Tablet By Mouth Two Times A Day As Needed 10)  Duoneb 0.5-2.5 (3) Mg/107ml Soln (Ipratropium-Albuterol) .... One Vial Per Nebulizer Three Times A Day Prn 11)  Ventolin Hfa 108 (90 Base) Mcg/act Aers (Albuterol Sulfate) .... 2 Puffs Every 4-6 Hrs As Needed 12)  Diphenoxylate-Atropine 2.5-0.025 Mg Tabs (Diphenoxylate-Atropine) .... One Tab By Mouth Once Daily Prn 13)  Tylox 5-500 Mg Caps (Oxycodone-Acetaminophen) .... Take 1 Tablet By Mouth Two Times A Day 14)  Flonase 50 Mcg/act Susp (Fluticasone Propionate) .... As Needed 15)  Lactulose 10 Gm/50ml Soln (Lactulose) .... 30  Cc Every 3 Days As Needed For Constipation 16)  Norvir 100 Mg Caps (Ritonavir) .... Take 1 Tablet By Mouth Once A Day 17)  Prezista 400 Mg Tabs (Darunavir Ethanolate) .... 2 Tab Once Daily 18)  Risperidone 0.5 Mg Tabs (Risperidone) .... One Tab By Mouth Qd 19)  Amitriptyline Hcl 10 Mg Tabs (Amitriptyline Hcl) .... One Tab By Mouth Qhs 20)  Allegra 60 Mg Tabs (Fexofenadine Hcl) .... One Tab By Mouth Qd 21)  Diclofenac Potassium 50 Mg Tabs (Diclofenac Potassium) .... One and Half Tabs By Mouth Two Times A Day 22)  Gabapentin 300 Mg Caps (Gabapentin) .... One Cap By Mouth At Bedtime 23)  Clindamycin Hcl 150 Mg Caps (Clindamycin Hcl) .... Uad 24)  Prednisone 10 Mg Tabs (Prednisone) .... Uad 25)  Nicoderm Cq 14 Mg/24hr Pt24 (Nicotine) .... Apply One Patch Daily For 2 Weeks 26)  Nicoderm Cq 7 Mg/24hr Pt24 (Nicotine) .... Apply One Pach Daily For 2 Weeks , Start 02/25 2011  Allergies: 1)  ! Sulfa 2)  ! Septra 3)  ! Pcn 4)  ! * Contrast Dye   Vital Signs:  Patient profile:   49 year old female Menstrual status:  hysterectomy Height:      64  inches Weight:      186 pounds Temp:     98.3 degrees F oral CC: follow-up visit Pain Assessment Patient in pain? yes     Location: abdomen Intensity: 9 Type: stabbing Onset of pain  Constant Nutritional Status Detail appetite is not good per patient  Does patient need assistance? Functional Status Self care Ambulation Normal Comments uses a cane or walker sometimes at home        Medication Adherence: 12/07/2009   Adherence to medications reviewed with patient. Counseling to provide adequate adherence provided                                Physical Exam  General:  alert, well-developed, and well-nourished.   Eyes:  pupils equal, pupils round, and pupils reactive to light.   Mouth:  good dentition, pharynx pink and moist, and no exudates.   Neck:  no masses.   Lungs:  normal respiratory effort and normal breath sounds.    Heart:  normal rate, regular rhythm, and no murmur.   Abdomen:  soft, non-tender, and normal bowel sounds.     Impression & Recommendations:  Problem # 1:  AIDS (ICD-042) this note appears to have been duplicated from Dr Anthony Sar note of 2-8. I will  1) restart her corrected ART 2) recheck her labs, esp CPK 3) have her return to clinic 53month Her updated medication list for this problem includes:    Clindamycin Hcl 150 Mg Caps (Clindamycin hcl) ..... Uad  Problem # 2:  TOBACCO USER (ICD-305.1) did well on nicotine patch. encouraged to quit.  Her updated medication list for this problem includes:    Nicoderm Cq 14 Mg/24hr Pt24 (Nicotine) .Marland Kitchen... Apply one patch daily for 2 weeks    Nicoderm Cq 7 Mg/24hr Pt24 (Nicotine) .Marland Kitchen... Apply one pach daily for 2 weeks , start 02/25 2011  Other Orders: Est. Patient Level IV ([address])

## 2010-11-21 NOTE — Progress Notes (Signed)
Summary: RX  Phone Note Call from Patient   Summary of Call: COMING TOMORROW TO PICK UP RX Initial call taken by: Lind Guest,  May 08, 2010 2:50 PM  Follow-up for Phone Call        noted and available Follow-up by: Everitt Amber LPN,  May 08, 2010 3:42 PM

## 2010-11-21 NOTE — Progress Notes (Signed)
Summary: rx  Phone Note Call from Patient   Summary of Call: pt states she will be by sometime today and pick up her rx.  Initial call taken by: Rudene Anda,  January 09, 2010 10:26 AM  Follow-up for Phone Call        noted Follow-up by: Adella Hare LPN,  January 09, 2010 11:17 AM

## 2010-11-21 NOTE — Progress Notes (Signed)
Summary: pain meds  Phone Note Call from Patient   Summary of Call: pt will be coming by to pick up rx. [phone] Initial call taken by: Rudene Anda,  June 08, 2010 9:23 AM  Follow-up for Phone Call        Noted Follow-up by: Everitt Amber LPN,  June 08, 2010 9:46 AM

## 2010-11-21 NOTE — Miscellaneous (Signed)
Summary: Home Care Report  Home Care Report   Imported By: Lind Guest 12/08/2009 13:17:26  _____________________________________________________________________  External Attachment:    Type:   Image     Comment:   External Document

## 2010-11-21 NOTE — Letter (Signed)
Summary: rpc chart  rpc chart   Imported By: Curtis Sites 05/17/2010 14:17:03  _____________________________________________________________________  External Attachment:    Type:   Image     Comment:   External Document

## 2010-11-21 NOTE — Progress Notes (Signed)
Summary: NEEDS A STATEMENT  Phone Note Call from Patient   Summary of Call: LAST TIME HERE SHE CAME BECAUSE DENTISIT SAID HER BP WAS HIGH AND WHEN SHE CAME HERE IT WAS NOT NEEDS A STATEMENT SAYING THAT HER BP IS FINE AND SEBD TO [facility] AT  FAX # [phone] Initial call taken by: Lind Guest,  June 22, 2010 11:41 AM  Follow-up for Phone Call        pls type a letter stating bp was 130/90 on the date of her last visit here, pls document the date, stamp, send to the dentist and let pt know Follow-up by: [name],  June 22, 2010 12:02 PM  Additional Follow-up for Phone Call Additional follow up Details #1::        letter faxed, called patient, left message Additional Follow-up by: Adella Hare LPN,  June 22, 2010 1:55 PM

## 2010-11-21 NOTE — Progress Notes (Signed)
  Phone Note From Pharmacy   Caller: [facility] Summary of Call: requesting refill on fluconazole Initial call taken by: Adella Hare LPN,  December 13, 2009 3:12 PM  Follow-up for Phone Call        deny, pt o call and request, or you can call her and see if she has symptomsl Follow-up by: [name],  December 13, 2009 4:41 PM  Additional Follow-up for Phone Call Additional follow up Details #1::        been on several antibiotics from hosptial having irritation Additional Follow-up by: Adella Hare LPN,  December 13, 2009 4:45 PM    Additional Follow-up for Phone Call Additional follow up Details #2::    refill x 2 pls Follow-up by: [name],  December 14, 2009 5:52 AM  Additional Follow-up for Phone Call Additional follow up Details #3:: Details for Additional Follow-up Action Taken: rx sent Additional Follow-up by: Adella Hare LPN,  December 14, 2009 9:30 AM  New/Updated Medications: FLUCONAZOLE 150 MG TABS (FLUCONAZOLE) one tab by mouth Prescriptions: FLUCONAZOLE 150 MG TABS (FLUCONAZOLE) one tab by mouth  #2 x 0   Entered by:   Adella Hare LPN   Authorized by:   [name]   Signed by:   Adella Hare LPN on 16/07/9603   Method used:   Electronically to        [facility] (retail)       [address] [place], [place]  [address]       Ph: [phone]       Fax: [phone]   RxID:   [id number]

## 2010-11-21 NOTE — Assessment & Plan Note (Signed)
Summary: follow up from hosp. dr Sherrie Mustache   Vital Signs:  Patient profile:   50 year old female Menstrual status:  hysterectomy Height:      64 inches Weight:      194.75 pounds BMI:     33.55 O2 Sat:      98 % on Room air Pulse rate:   98 / minute Pulse rhythm:   regular Resp:     20 per minute BP sitting:   130 / 100  (right arm)  Vitals Entered By: Worthy Keeler LPN (November 29, 2009 10:49 AM)  Nutrition Counseling: Patient's BMI is greater than 25 and therefore counseled on weight management options.  O2 Flow:  Room air CC: hospital follow up Is Patient Diabetic? No Pain Assessment Patient in pain? yes     Location: all over Intensity: 10 Type: ache Onset of pain  Constant   Primary Care Provider:  [name]  CC:  hospital follow up.  History of Present Illness: 50 yo F with hx of hospitalization in [facility] , d/c 2-2 with temp 102.9 , confusion, SOB and cough. 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 She was treated with prednisone, clinida and primaquine and imrpoved rapidly. She also had acute ARF (improved with hydration) as well as increased CPK. She had not been taking her DRV prior to arrival. In hospital her CD4 140 was and VL 235.   Feels not well today. Having stomach cramps. Having diarrhea for last 5 days. No more fevers but has had hot and cold chills. eating poorly.   Preventive Screening-Counseling & Management  Alcohol-Tobacco     Alcohol drinks/day: 0     Smoking Status: current     Smoking Cessation Counseling: yes     Smoke Cessation Stage: contemplative     Packs/Day: 0.5  Current Medications (verified): 1)  Ambien 10 Mg Tabs (Zolpidem Tartrate) .... Take 1 Tablet By Mouth At Bedtime As Needed 2)  Hydroxyzine Hcl 25 Mg Tabs (Hydroxyzine Hcl) .... As Needed 3)  Nexium 40 Mg Cpdr (Esomeprazole Magnesium) .... Take 1 Capsule By Mouth Once A Day 4)  Trizivir 300-150-300 Mg Tabs (Abacavir-Lamivudine-Zidovudine) .... Take 1 Tablet By Mouth  Twice A Day 5)  Verapamil Hcl Cr 240 Mg Tbcr (Verapamil Hcl) .... Take 1 Tablet By Mouth Once A Day 6)  Qvar 40 Mcg/act Aers (Beclomethasone Dipropionate) .... 2 Puffs Once Daily 7)  Xanax 1 Mg  Tabs (Alprazolam) .... Take 1 Tablet By Mouth Two Times A Day As Needed 8)  Promethazine Hcl 25 Mg Tabs (Promethazine Hcl) .... Take 1 Tablet By Mouth Two Times A Day As Needed 9)  Duoneb 0.5-2.5 (3) Mg/72ml Soln (Ipratropium-Albuterol) .... One Vial Per Nebulizer Three Times A Day Prn 10)  Ventolin Hfa 108 (90 Base) Mcg/act Aers (Albuterol Sulfate) .... 2 Puffs Every 4-6 Hrs As Needed 11)  Diphenoxylate-Atropine 2.5-0.025 Mg Tabs (Diphenoxylate-Atropine) .... One Tab By Mouth Once Daily Prn 12)  Flonase 50 Mcg/act Susp (Fluticasone Propionate) .... As Needed 13)  Norvir 100 Mg Caps (Ritonavir) .... Take 1 Tablet By Mouth Once A Day 14)  Prezista 400 Mg Tabs (Darunavir Ethanolate) .... 2 Tab Once Daily 15)  Risperidone 0.5 Mg Tabs (Risperidone) .... One Tab By Mouth Qd 16)  Amitriptyline Hcl 10 Mg Tabs (Amitriptyline Hcl) .... One Tab By Mouth Qhs 17)  Allegra 60 Mg Tabs (Fexofenadine Hcl) .... One Tab By Mouth Qd 18)  Diclofenac Potassium 50 Mg Tabs (Diclofenac Potassium) .Marland KitchenMarland KitchenMarland Kitchen  One and Half Tabs By Mouth Two Times A Day 19)  Gabapentin 300 Mg Caps (Gabapentin) .... One Cap By Mouth At Bedtime 20)  Clindamycin Hcl 150 Mg Caps (Clindamycin Hcl) .... Uad 21)  Prednisone 10 Mg Tabs (Prednisone) .... Uad 22)  Nicoderm Cq 14 Mg/24hr Pt24 (Nicotine) .... Apply One Patch Daily For 2 Weeks 23)  Nicoderm Cq 7 Mg/24hr Pt24 (Nicotine) .... Apply One Pach Daily For 2 Weeks , Start 02/25 2011  Allergies (verified): 1)  ! Sulfa 2)  ! Septra 3)  ! Pcn 4)  ! * Contrast Dye  Past History:  Past Medical History: Baker's cyst Pulmonary lesion on RUL on CT 1/03 Hemorrhoids Zoster 2001 Asthma GERD HIV disease     M184V 04-13-09 Hypertension ? 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   Eyes:  pupils equal, pupils round, and pupils reactive to light.   Mouth:  pharynx pink and moist and no exudates.   Neck:  no masses.   Lungs:  normal respiratory effort and normal breath sounds.   Heart:  normal rate, regular rhythm, and no murmur.   Abdomen:  soft, non-tender, and normal bowel sounds.     Impression & Recommendations:  Problem # 1:  HIV DISEASE (ICD-042) will 1) restart her correct ART. 2) check her labs, esp CPK 3) encourage by mouth fluids (suspect she has norovirus since d/c from the hospital).  4) have her return to clinic 1 month  Problem # 2:  TOBACCO USER (ICD-305.1) encouraged to stop. she has been on nicotine patch and did well with this.  Her updated medication list for this problem includes:    Nicoderm Cq 14 Mg/24hr Pt24 (Nicotine) .Marland Kitchen... Apply one patch daily for 2 weeks    Nicoderm Cq 7 Mg/24hr Pt24 (Nicotine) .Marland Kitchen... Apply one pach daily for 2 weeks , start 02/25 2011  Complete Medication List: 1)  Ambien 10 Mg Tabs (Zolpidem tartrate) .... Take 1 tablet by mouth at bedtime as needed 2)  Hydroxyzine Hcl 25 Mg Tabs (Hydroxyzine hcl) .... As needed 3)  Nexium 40 Mg Cpdr (Esomeprazole magnesium) .... Take 1 capsule by mouth once a day 4)  Trizivir 300-150-300 Mg Tabs (Abacavir-lamivudine-zidovudine) .... Take 1 tablet by mouth twice a day 5)  Verapamil Hcl Cr 240 Mg Tbcr (Verapamil hcl) .... Take 1 tablet by mouth once a day 6)  Qvar 40 Mcg/act Aers (Beclomethasone dipropionate) .... 2 puffs once daily 7)  Xanax 1 Mg Tabs (Alprazolam) .... Take 1 tablet by mouth two times a day as  needed 8)  Promethazine Hcl 25 Mg Tabs (Promethazine hcl) .... Take 1 tablet by mouth two times a day as needed 9)  Duoneb 0.5-2.5 (3) Mg/18ml Soln (Ipratropium-albuterol) .... One vial per nebulizer three times a day prn 10)  Ventolin Hfa 108 (90 Base) Mcg/act Aers (Albuterol sulfate) .... 2 puffs every 4-6 hrs as needed 11)  Diphenoxylate-atropine 2.5-0.025 Mg Tabs (Diphenoxylate-atropine) .... One tab by mouth once daily prn 12)  Flonase 50 Mcg/act Susp (Fluticasone propionate) .... As needed 13)  Norvir 100 Mg Caps (Ritonavir) .... Take 1 tablet by mouth once a day 14)  Prezista 400 Mg Tabs (Darunavir ethanolate) .... 2  tab once daily 15)  Risperidone 0.5 Mg Tabs (Risperidone) .... One tab by mouth qd 16)  Amitriptyline Hcl 10 Mg Tabs (Amitriptyline hcl) .... One tab by mouth qhs 17)  Allegra 60 Mg Tabs (Fexofenadine hcl) .... One tab by mouth qd 18)  Diclofenac Potassium 50 Mg Tabs (Diclofenac potassium) .... One and half tabs by mouth two times a day 19)  Gabapentin 300 Mg Caps (Gabapentin) .... One cap by mouth at bedtime 20)  Clindamycin Hcl 150 Mg Caps (Clindamycin hcl) .... Uad 21)  Prednisone 10 Mg Tabs (Prednisone) .... Uad 22)  Nicoderm Cq 14 Mg/24hr Pt24 (Nicotine) .... Apply one patch daily for 2 weeks 23)  Nicoderm Cq 7 Mg/24hr Pt24 (Nicotine) .... Apply one pach daily for 2 weeks , start 02/25 2011  Other Orders: T-Basic Metabolic Panel [id number]) TLB-CK Total Only(Creatine Kinase/CPK) (82550-CK) T-Hepatic Function [phone]) T- Hemoglobin A1C (70350-09381) Ketorolac-Toradol 15mg  (W2993) Benadryl  IM or IV (J1200) Admin of Therapeutic Inj  intramuscular or subcutaneous (71696) Future Orders: T-CBC w/Diff ([id number]) ... 12/07/2009 T-Comprehensive Metabolic Panel [id number]) ... 12/07/2009 T-CK Total ([phone]) ... 12/07/2009 T-HIV Viral Load [phone]) ... 12/07/2009 T-CD4SP ([facility]) (CD4SP) ... 12/07/2009 T-RPR (Syphilis) [id number]) ...  12/07/2009 T-Lipid Profile [id number]) ... 12/07/2009  Patient Instructions: 1)  F/U in 6 weeks. 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 Patient Level IV [99214] 2)  T-Basic Metabolic Panel [[id number]] 3)  TLB-CK Total Only(Creatine Kinase/CPK) [82550-CK] 4)  T-Hepatic Function [80076-22960] 5)  T- Hemoglobin A1C [83036-23375] 6)  Ketorolac-Toradol 15mg  [J1885] 7)  Benadryl  IM or IV [J1200] 8)  Admin of Therapeutic Inj  intramuscular or subcutaneous [96372] 9)  T-CBC w/Diff [24268-34196] 10)  T-Comprehensive Metabolic Panel [80053-22900] 11)  T-CK Total [82550-23250] 12)  T-HIV Viral Load [22297-98921] 13)  T-CD4SP ([facility]) [CD4SP] 14)  T-RPR (Syphilis) [19417-40814] 15)  T-Lipid Profile [80061-22930] 16)  Est. Patient Level IV [48185]

## 2010-11-21 NOTE — Letter (Signed)
Summary: [facility]  [facility]   Imported By: Lind Guest 08/16/2010 15:02:43  _____________________________________________________________________  External Attachment:    Type:   Image     Comment:   External Document

## 2010-11-21 NOTE — Medication Information (Signed)
Summary: Tax adviser   Imported By: Lind Guest 05/15/2010 08:52:19  _____________________________________________________________________  External Attachment:    Type:   Image     Comment:   External Document

## 2010-11-21 NOTE — Assessment & Plan Note (Signed)
Summary: ov   Vital Signs:  Patient profile:   50 year old female Menstrual status:  hysterectomy Height:      64 inches Weight:      191.25 pounds BMI:     32.95 O2 Sat:      99 % on Room air Pulse rate:   112 / minute Pulse rhythm:   regular Resp:     16 per minute BP sitting:   112 / 80  (left arm)  Vitals Entered By: Adella Hare LPN (September 11, 2010 9:59 AM)  Nutrition Counseling: Patient's BMI is greater than 25 and therefore counseled on weight management options.  O2 Flow:  Room air CC: sore throat, cough Is Patient Diabetic? No Comments did not bring meds to ov   Primary Care Raquan Iannone:  [name]  CC:  sore throat and cough.  History of Present Illness: Pt reports increased frustration due to uncontrolled arthritic pain, states the epidurals are not helping and she has been unable to et a return call from the pain clinic. states last week she was told by her therapist thart she was bordeline manic an that she should call him back, no response yet. Feels as though she is not being treated fairly, states not getting responses from the pain mx and no one is answering I will try to contact the doc today. reports reptd falls, states the person helping her at home si no good and she intends to request new help.  Dr Shelva Majestic needs to be called. denies any recent fever or chills. reports head and chest congestion. denies abdominal pain, nausea, vomitting , diarreah or constipation.  Allergies: 1)  ! Sulfa 2)  ! Pcn  Review of Systems      See HPI General:  Complains of fatigue, malaise, and weakness. Eyes:  Denies discharge, eye pain, and red eye. ENT:  Complains of hoarseness, nasal congestion, and sinus pressure. CV:  Denies chest pain or discomfort, palpitations, and swelling of feet. Resp:  Complains of cough, shortness of breath, and sputum productive. GI:  Complains of nausea; denies abdominal pain, constipation, diarrhea, and vomiting. GU:   Denies dysuria and urinary frequency. MS:  Complains of joint pain, low back pain, mid back pain, muscle weakness, and stiffness. Psych:  Complains of anxiety, depression, irritability, mental problems, and thoughts of violence; denies suicidal thoughts/plans, unusual visions or sounds, and thoughts /plans of harming others. Endo:  Denies cold intolerance, excessive hunger, and excessive thirst. Heme:  Denies abnormal bruising and bleeding. Allergy:  Complains of seasonal allergies; denies hives or rash and itching eyes.  Physical Exam  General:  Well-developed,well-nourished,in no acute distress; alert,appropriate and cooperative throughout examination HEENT: No facial asymmetry,  EOMI, positive  sinus tenderness, TM's Clear, oropharynx  pink and moist. nasal mucosa erythematous and edematous  Chest: decreased air ntry, bilateral crackles, no wheezes CVS: S1, S2, No murmurs, No S3.   Abd: Soft, Nontender.  MS: Adequate ROM spine, hips, shoulders and knees.  Ext: No edema.   CNS: CN 2-12 intact, power tone and sensation normal throughout.   Skin: Intact, no visible lesions or rashes.  Psych: Good eye contact, normal affect.  Memory intact,anxious and  depressed appearing.    Impression & Recommendations:  Problem # 1:  SINUSITIS, ACUTE (ICD-461.9) Assessment Comment Only  The following medications were removed from the medication list:    Doxycycline Hyclate 100 Mg Caps (Doxycycline hyclate) .Marland Kitchen... Take 1 capsule by mouth two times a day Her  updated medication list for this problem includes:    Tessalon Perles 100 Mg Caps (Benzonatate) .Marland Kitchen... Take 1 capsule by mouth three times a day    Levaquin 500 Mg Tabs (Levofloxacin) .Marland Kitchen... Take 1 tablet by mouth once a day  Problem # 2:  ASTHMA (ICD-493.90) Assessment: Unchanged  Her updated medication list for this problem includes:    Qvar 40 Mcg/act Aers (Beclomethasone dipropionate) .Marland Kitchen... 2 puffs two times a day    Ventolin Hfa 108 (90  Base) Mcg/act Aers (Albuterol sulfate) .Marland Kitchen... 2 puffs every 4-6 hrs as needed  Problem # 3:  ACUTE BRONCHITIS (ICD-466.0) Assessment: Comment Only  The following medications were removed from the medication list:    Doxycycline Hyclate 100 Mg Caps (Doxycycline hyclate) .Marland Kitchen... Take 1 capsule by mouth two times a day Her updated medication list for this problem includes:    Qvar 40 Mcg/act Aers (Beclomethasone dipropionate) .Marland Kitchen... 2 puffs two times a day    Ventolin Hfa 108 (90 Base) Mcg/act Aers (Albuterol sulfate) .Marland Kitchen... 2 puffs every 4-6 hrs as needed    Tessalon Perles 100 Mg Caps (Benzonatate) .Marland Kitchen... Take 1 capsule by mouth three times a day    Levaquin 500 Mg Tabs (Levofloxacin) .Marland Kitchen... 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 Take 1 tablet by mouth once a day    Clonidine Hcl 0.1 Mg Tabs (Clonidine hcl) .Marland Kitchen... Take 1 tab by mouth at bedtime  BP today: 112/80 Prior BP: 125/88 (08/02/2010)  Labs Reviewed: K+: 4.0 (08/02/2010) Creat: : 0.73 (08/02/2010)   Chol: 262 (12/07/2009)   HDL: 66 (12/07/2009)   LDL: * mg/dL (02/72/5366)   TG: 440 (12/07/2009)  Complete Medication List: 1)  Ambien 10 Mg Tabs (Zolpidem tartrate) .... Take 1 tablet by mouth at bedtime as needed 2)  Hydroxyzine Hcl 25 Mg Tabs (Hydroxyzine hcl) .... As needed 3)  Nexium 40 Mg Cpdr (Esomeprazole magnesium) .... Take 1 capsule by mouth once a day 4)  Trizivir 300-150-300 Mg Tabs (Abacavir-lamivudine-zidovudine) .... Take 1 tablet by mouth twice a day 5)  Verapamil Hcl Cr 240 Mg Tbcr (Verapamil hcl) .... Take 1 tablet by mouth once a day 6)  Qvar 40 Mcg/act Aers (Beclomethasone dipropionate) .... 2 puffs two times a day 7)   Xanax 1 Mg Tabs (Alprazolam) .... Take 1 tablet by mouth two times a day as needed 8)  Promethazine Hcl 25 Mg Tabs (Promethazine hcl) .... Take 1 tablet by mouth two times a day as needed 9)  Ventolin Hfa 108 (90 Base) Mcg/act Aers (Albuterol sulfate) .... 2 puffs every 4-6 hrs as needed 10)  Diphenoxylate-atropine 2.5-0.025 Mg Tabs (Diphenoxylate-atropine) .... One tab by mouth once daily prn 11)  Norvir 100 Mg Tabs (Ritonavir) .... Take 1 tablet by mouth once a day 12)  Prezista 400 Mg Tabs (Darunavir ethanolate) .... 2 tab once daily 13)  Gabapentin 300 Mg Caps (Gabapentin) .... One cap by mouth at bedtime 14)  Clonidine Hcl 0.1 Mg Tabs (Clonidine hcl) .... Take 1 tab by mouth at bedtime 15)  Seroquel 100 Mg Tabs (Quetiapine fumarate) .... Take one tablet by mouth at bedtime. 16)  Tylox 5-500 Mg Caps (Oxycodone-acetaminophen) .... Take 1 capsule by mouth once a day  17)  Tessalon Perles 100 Mg Caps (Benzonatate) .... Take 1 capsule by mouth three times a day 18)  Fluconazole 150 Mg Tabs (Fluconazole) .... Take 1 tablet by mouth once a day as needed for itching 19)  Levaquin 500 Mg Tabs (Levofloxacin) .... Take 1 tablet by mouth once a day  Patient Instructions: 1)  Please schedule a follow-up appointment in 3 months. 2)  You are being treated fro acute bronchitis and sinusitis Prescriptions: LEVAQUIN 500 MG TABS (LEVOFLOXACIN) Take 1 tablet by mouth once a day  #7 x 0   Entered and Authorized by:   [name]   Signed by:   [name] on 09/12/2010   Method used:   Electronically to        [facility] (retail)       [address]       [place], [place]  [address]       Ph: [phone]       Fax: [phone]   RxID:   [id number] FLUCONAZOLE 150 MG TABS (FLUCONAZOLE) Take 1 tablet by mouth once a day as needed for itching  #3 x 0   Entered and Authorized by:   [name]   Signed by:   [name] on  09/11/2010   Method used:   Printed then faxed to ...       [facility] (retail)       [address]       [place], [place]  [address]       Ph: [phone]       Fax: [phone]   RxID:   [id number] TESSALON PERLES 100 MG CAPS (BENZONATATE) Take 1 capsule by mouth three times a day  #21 x 0   Entered and Authorized by:   [name]   Signed by:   [name] on 09/11/2010   Method used:   Printed then faxed to ...       [facility] (retail)       [address]       [place], [place]  [address]       Ph: [phone]       Fax: [phone]   RxID:   [id number] DOXYCYCLINE HYCLATE 100 MG CAPS (DOXYCYCLINE HYCLATE) Take 1 capsule by mouth two times a day  #14 x 0   Entered and Authorized by:   [name]   Signed by:   [name] on 09/11/2010   Method used:   Electronically to        [facility] (retail)       [address]       [place], [place]  [address]       Ph: [phone]       Fax: [phone]   RxID:   [id number]    Orders Added: 1)  Est. Patient Level IV [67893] 2)  Medicare Electronic Prescription [phone]

## 2010-11-21 NOTE — Assessment & Plan Note (Signed)
Summary: 29month f/u [mkj]   Primary Provider:  [name]  CC:  3 month follow up and having some chest pain since Sunday.  History of Present Illness: 50 yo F with hx of hospitalization in [place] earlier this year with suspicion of PCP.   CD4 630 and VL 51 (12-07-09). Last  is having continued problems with back pain and leg pain. Also with wt gain. Cont to have prod cough- white mucous, had chills over past w/e. Felt sick and stayed in bed over w/e. had n/v. Recently had her q-var increased due to increased wheezing.    Preventive Screening-Counseling & Management  Alcohol-Tobacco     Alcohol drinks/day: occassionally     Alcohol type: beer/wine     Smoking Status: current     Smoking Cessation Counseling: yes     Smoke Cessation Stage: contemplative     Packs/Day: 0.5  Caffeine-Diet-Exercise     Caffeine use/day: soda occassionally     Does Patient Exercise: no  Safety-Violence-Falls     Seat Belt Use: yes   Updated Prior Medication List: AMBIEN 10 MG TABS (ZOLPIDEM TARTRATE) Take 1 tablet by mouth at bedtime as needed HYDROXYZINE HCL 25 MG TABS (HYDROXYZINE HCL) as needed NEXIUM 40 MG CPDR (ESOMEPRAZOLE MAGNESIUM) Take 1 capsule by mouth once a day TRIZIVIR 300-150-300 MG TABS (ABACAVIR-LAMIVUDINE-ZIDOVUDINE) Take 1 tablet by mouth twice a day VERAPAMIL HCL CR 240 MG TBCR (VERAPAMIL HCL) Take 1 tablet by mouth once a day QVAR 40 MCG/ACT AERS (BECLOMETHASONE DIPROPIONATE) 2 puffs two times a day XANAX 1 MG  TABS (ALPRAZOLAM) Take 1 tablet by mouth two times a day as needed PROMETHAZINE HCL 25 MG TABS (PROMETHAZINE HCL) Take 1 tablet by mouth two times a day as needed VENTOLIN HFA 108 (90 BASE) MCG/ACT AERS (ALBUTEROL SULFATE) 2 puffs every 4-6 hrs as needed DIPHENOXYLATE-ATROPINE 2.5-0.025 MG TABS (DIPHENOXYLATE-ATROPINE) one tab by mouth once daily prn NORVIR 100 MG CAPS (RITONAVIR) Take 1 tablet by mouth once a day PREZISTA 400 MG TABS (DARUNAVIR  ETHANOLATE) 2 tab once daily GABAPENTIN 300 MG CAPS (GABAPENTIN) one cap by mouth at bedtime TYLOX 5-500 MG CAPS (OXYCODONE-ACETAMINOPHEN) Take 1 tablet by mouth two times a day CLONIDINE HCL 0.1 MG TABS (CLONIDINE HCL) Take 1 tab by mouth at bedtime SEROQUEL 100 MG TABS (QUETIAPINE FUMARATE) Take one tablet by mouth at bedtime.  Current Allergies (reviewed today): ! SULFA ! PCN Past History:  Past medical, surgical, family and social histories (including risk factors) reviewed, and no changes noted (except as noted below).  Past Medical History: Reviewed history from 11/29/2009 and no changes required. Baker's cyst Pulmonary lesion on RUL on CT 1/03 Hemorrhoids Zoster 2001 Asthma GERD HIV disease     M184V 04-13-09 Hypertension ? 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 Right breast lumpectomy TAH and BSO Cholecystectomy  Left knee arthroscopy  Family History: Reviewed history from 06/22/2008 and no changes required. Family History Diabetes 1st degree relative Family History Hypertension MOM  DECEASED  72  HTM, DM DAD DECEASED  70  DM SISTERS 1 living, 2 deceased cancer liver Brother x1 deceased in his 31's diabetes  Social History: Reviewed history from 06/22/2008 and no changes required. Current Smoker- 1ppd, >25  years Alcohol use-yes Drug use-yes, marijuana BUSINESS MANAGEMENT  in school SEPERATED No children  Current Medications (verified): 1)  Ambien 10 Mg Tabs (Zolpidem Tartrate) .... Take 1 Tablet By Mouth At Bedtime As Needed 2)  Hydroxyzine Hcl 25 Mg Tabs (Hydroxyzine Hcl) .... As Needed 3)   Nexium 40 Mg Cpdr (Esomeprazole Magnesium) .... Take 1 Capsule By Mouth Once A Day 4)  Trizivir 300-150-300 Mg Tabs (Abacavir-Lamivudine-Zidovudine) .... Take 1 Tablet By Mouth Twice A Day 5)  Verapamil Hcl Cr 240 Mg Tbcr (Verapamil Hcl) .... Take 1 Tablet By Mouth Once A Day 6)  Qvar 40 Mcg/act Aers (Beclomethasone Dipropionate) .... 2 Puffs Two Times A Day 7)  Xanax 1 Mg  Tabs (Alprazolam) .... Take 1 Tablet By Mouth Two Times A Day As Needed 8)  Promethazine Hcl 25 Mg Tabs (Promethazine Hcl) .... Take 1 Tablet By Mouth Two Times A Day As Needed 9)  Ventolin Hfa 108 (90 Base) Mcg/act Aers (Albuterol Sulfate) .... 2 Puffs Every 4-6 Hrs As Needed 10)  Diphenoxylate-Atropine 2.5-0.025 Mg Tabs (Diphenoxylate-Atropine) .... One Tab By Mouth Once Daily Prn 11)  Norvir 100 Mg Caps (Ritonavir) .... Take 1 Tablet By Mouth Once A Day 12)  Prezista 400 Mg Tabs (Darunavir Ethanolate) .... 2 Tab Once Daily 13)  Gabapentin 300 Mg Caps (Gabapentin) .... One Cap By Mouth At Bedtime 14)  Tylox 5-500 Mg Caps (Oxycodone-Acetaminophen) .... Take 1 Tablet By Mouth Two Times A Day 15)  Clonidine Hcl 0.1 Mg Tabs (Clonidine Hcl) .... Take 1 Tab By Mouth At Bedtime 16)  Seroquel 100 Mg Tabs (Quetiapine Fumarate) .... Take One Tablet By Mouth At Bedtime.  Allergies: 1)  ! Sulfa 2)  ! Pcn   Vital Signs:  Patient profile:   50 year old female Menstrual status:  hysterectomy Height:      64 inches (162.56 cm) Temp:     97.4 degrees F (36.33 degrees C) oral Pulse rate:   86 / minute BP sitting:   120 / 89  (right arm)  Vitals Entered By: Baxter Hire) (April 05, 2010 10:21 AM) CC: 3 month follow up and having some chest pain since Sunday Pain Assessment Patient in pain? yes     Location: upper left leg Intensity: 8 Type: aching/stiff Onset of pain  Constant Nutritional Status Detail appetite is okay per patient  Have you ever been in a relationship where you felt threatened, hurt or afraid?No     Does patient need assistance? Functional Status Self care Ambulation Normal Comments Patient did not want their weight taken at this visit.        Medication Adherence: 04/05/2010   Adherence to medications reviewed with patient. Counseling to provide adequate adherence provided   Prevention For Positives: 04/05/2010   Safe sex practices discussed with patient. Condoms offered.                             Physical Exam  General:  well-developed, well-nourished, well-hydrated, and overweight-appearing.   Eyes:  pupils equal, pupils round, and pupils reactive to light.   Mouth:  pharynx pink and moist and no exudates.   Neck:  no masses.   Lungs:  normal respiratory effort and normal breath sounds.   Heart:  normal rate, regular rhythm, and no murmur.   Abdomen:  soft, non-tender, and normal bowel sounds.     Impression & Recommendations:  Problem # 1:  HIV DISEASE (ICD-042)  PAP due July 2011. doing well on meds, her doses are re-iterated to her. rx for RTV written as tablets so that they will not need to be refrigerated. will recheck her labs. she is given condoms. return to clinic 4-5 months.   Problem # 2:  ASTHMA (ICD-493.90) appears to be doing fairly well today.  Her updated medication list for this problem includes:    Qvar 40 Mcg/act Aers (Beclomethasone dipropionate) .Marland Kitchen... 2 puffs two times a day    Ventolin Hfa 108 (90 Base) Mcg/act Aers (Albuterol sulfate) .Marland Kitchen... 2 puffs every 4-6 hrs as needed  Problem # 3:  ABNORMAL PAP SMEAR, LGSIL (ICD-795.09) f/u July 2011  Problem # 4:  DEPRESSION (ICD-311) she is dong well today, less animated than previous.  Her updated medication list for this problem includes:    Hydroxyzine Hcl 25 Mg Tabs (Hydroxyzine hcl) .Marland Kitchen... As needed    Xanax 1 Mg Tabs (Alprazolam) .Marland Kitchen... Take 1 tablet by mouth two times a day as needed  Medications Added to Medication List This Visit: 1)  Qvar 40 Mcg/act Aers (Beclomethasone dipropionate)  .... 2 puffs two times a day 2)  Norvir 100 Mg Tabs (Ritonavir) .... Take 1 tablet by mouth once a day 3)  Seroquel 100 Mg Tabs (Quetiapine fumarate) .... Take one tablet by mouth at bedtime.  Other Orders: Est. Patient Level IV [phone]) T-CD4SP ([facility]) (CD4SP) T-HIV Viral Load [phone]) T-Comprehensive Metabolic Panel [phone]) T-CBC w/Diff ([id number])  Prescriptions: NORVIR 100 MG TABS (RITONAVIR) Take 1 tablet by mouth once a day  #90 x 3   Entered and Authorized by:   Johny Sax MD   Signed by:   Johny Sax MD on 04/05/2010   Method used:   Print then Give to Patient   RxID:   [id number]

## 2010-11-21 NOTE — Miscellaneous (Signed)
Summary: NARC REFILL  Clinical Lists Changes  Medications: Rx of TYLOX 5-500 MG CAPS (OXYCODONE-ACETAMINOPHEN) Take 1 capsule by mouth once a day;  #30 x 0;  Signed;  Entered by: Everitt Amber LPN;  Authorized by: [name];  Method used: Handwritten    Prescriptions: TYLOX 5-500 MG CAPS (OXYCODONE-ACETAMINOPHEN) Take 1 capsule by mouth once a day  #30 x 0   Entered by:   Everitt Amber LPN   Authorized by:   [name]   Signed by:   Everitt Amber LPN on 16/07/9603   Method used:   Handwritten   RxID:   [id number]

## 2010-11-21 NOTE — Assessment & Plan Note (Signed)
Summary: f/u   Primary Provider:  [name]  CC:  follow-up visit.  History of Present Illness: 50 yo F with long hx of HIV+, hx of hospitalization in [place] earlier this year with suspicion of PCP.   CD4 470 and VL 764 (04-05-10). Is having continued problems with back pain and radicular leg pain. Has been eval by neurosurgery. is going to seen pain center 08-08-10.  Started OTC pain medication and then developed n/v over the last 4 days. has had chills, feeling hot. No fevers. Had some sweats as well. Mild diarrhea, prior to nausea. going 3x in a row. took a diphenoxylate and it improved.     Preventive Screening-Counseling & Management  Alcohol-Tobacco     Alcohol drinks/day: occassionally     Alcohol type: beer/wine     Smoking Status: current     Smoking Cessation Counseling: yes     Smoke Cessation Stage: contemplative     Packs/Day: 0.5  Caffeine-Diet-Exercise     Caffeine use/day: soda occassionally     Does Patient Exercise: no  Safety-Violence-Falls     Seat Belt Use: yes      Drug Use:  current, marijuana, and smokes every now and then.     Updated Prior Medication List: AMBIEN 10 MG TABS (ZOLPIDEM TARTRATE) Take 1 tablet by mouth at bedtime as needed HYDROXYZINE HCL 25 MG TABS (HYDROXYZINE HCL) as needed NEXIUM 40 MG CPDR (ESOMEPRAZOLE MAGNESIUM) Take 1 capsule by mouth once a day TRIZIVIR 300-150-300 MG TABS (ABACAVIR-LAMIVUDINE-ZIDOVUDINE) Take 1 tablet by mouth twice a day VERAPAMIL HCL CR 240 MG TBCR (VERAPAMIL HCL) Take 1 tablet by mouth once a day QVAR 40 MCG/ACT AERS (BECLOMETHASONE DIPROPIONATE) 2 puffs two times a day XANAX 1 MG  TABS (ALPRAZOLAM) Take 1 tablet by mouth two times a day as needed PROMETHAZINE HCL 25 MG TABS (PROMETHAZINE HCL) Take 1 tablet by mouth two times a day as needed VENTOLIN HFA 108 (90 BASE) MCG/ACT AERS (ALBUTEROL SULFATE) 2 puffs every 4-6 hrs as needed DIPHENOXYLATE-ATROPINE 2.5-0.025 MG TABS  (DIPHENOXYLATE-ATROPINE) one tab by mouth once daily prn NORVIR 100 MG TABS (RITONAVIR) Take 1 tablet by mouth once a day PREZISTA 400 MG TABS (DARUNAVIR ETHANOLATE) 2 tab once daily GABAPENTIN 300 MG CAPS (GABAPENTIN) one cap by mouth at bedtime CLONIDINE HCL 0.1 MG TABS (CLONIDINE HCL) Take 1 tab by mouth at bedtime SEROQUEL 100 MG TABS (QUETIAPINE FUMARATE) Take one tablet by mouth at bedtime. TYLOX 5-500 MG CAPS (OXYCODONE-ACETAMINOPHEN) Take 1 capsule by mouth once a day  Current Allergies (reviewed today): ! SULFA ! PCN Past History:  Past medical, surgical, family and social histories (including risk factors) reviewed, and no changes noted (except as noted below).  Past Medical History: Reviewed history from 11/29/2009 and no changes required. Baker's cyst Pulmonary lesion on RUL on CT 1/03 Hemorrhoids Zoster 2001 Asthma GERD HIV disease     M184V 04-13-09 Hypertension ? 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 Right breast lumpectomy TAH and BSO Cholecystectomy  Left knee arthroscopy  Family History: Reviewed history from 06/22/2008 and no changes required. Family History Diabetes 1st degree relative Family History Hypertension MOM  DECEASED  72  HTM, DM DAD DECEASED  70  DM  SISTERS 1 living, 2 deceased cancer liver Brother x1 deceased in his 35's diabetes  Social History: Reviewed history from 04/05/2010 and no changes required. Current Smoker- 1ppd, >25 years Alcohol use-yes Drug use-yes, marijuana SEPERATED No childrenDrug Use:  current, marijuana, smokes every now and then  Vital  Signs:  Patient profile:   50 year old female Menstrual status:  hysterectomy Height:      64 inches (162.56 cm) Weight:      192.0 pounds (87.27 kg) BMI:     33.08 Temp:     97.7 degrees F (36.50 degrees C) oral Pulse rate:   91 / minute BP sitting:   125 / 88  (right arm)  Vitals Entered By: Baxter Hire) (August 02, 2010 11:16 AM) CC: follow-up visit Pain Assessment Patient in pain? yes     Location: left side body aches Intensity: 6 Type: aching Onset of pain  Constant Nutritional Status BMI of > 30 = obese Nutritional Status Detail appetite is okay per patient  Have you ever been in a relationship where you felt threatened, hurt or afraid?No   Does patient need assistance? Functional Status Self care Ambulation Normal   Physical Exam  General:  well-developed, well-nourished, well-hydrated, and overweight-appearing.   Eyes:  pupils equal, pupils round, and pupils reactive to light.   Mouth:  pharynx pink and moist and no exudates.   Neck:  no masses.   Lungs:  normal respiratory effort and normal breath sounds.   Heart:  normal rate, regular rhythm, and no murmur.   Abdomen:  soft, non-tender, and normal bowel sounds.          Medication Adherence: 08/02/2010   Adherence to medications reviewed with patient. Counseling to provide adequate adherence provided   Prevention For Positives: 08/02/2010   Safe sex practices discussed with patient. Condoms offered.                             Impression & Recommendations:  Problem # 1:  AIDS (ICD-042) she appears to be doing well. offered vaccines, she refuses. offered/given condoms. will cont her current meds. she has f/u with pain clinic today. will check her labs with genotype, return to clinic 3-4 months.   Problem # 2:  OBESITY, UNSPECIFIED (ICD-278.00) she asks for ensure rx (given). is reasonable given her n/v.   Problem # 3:  TOBACCO USER (ICD-305.1) counseled to quit smoking....  Problem  # 4:  HYPERTENSION (ICD-401.9) BP  nl today. cont to watch.  Her updated medication list for this problem includes:    Verapamil Hcl Cr 240 Mg Tbcr (Verapamil hcl) .Marland Kitchen... Take 1 tablet by mouth once a day    Clonidine Hcl 0.1 Mg Tabs (Clonidine hcl) .Marland Kitchen... Take 1 tab by mouth at bedtime  Other Orders: T-CD4SP [facility]) (CD4SP) T-HIV Viral Load [phone]) T-Comprehensive Metabolic Panel [phone]) T-CBC w/Diff [phone]) T-HIV Genotype ([id number]) Est. Patient Level IV (28413)

## 2010-11-21 NOTE — Letter (Signed)
Summary: Letter  Letter   Imported By: Lind Guest 04/04/2010 09:06:16  _____________________________________________________________________  External Attachment:    Type:   Image     Comment:   External Document

## 2010-11-21 NOTE — Miscellaneous (Signed)
Summary: NARC REFILL  Clinical Lists Changes  Medications: Rx of TYLOX 5-500 MG CAPS (OXYCODONE-ACETAMINOPHEN) Take 1 tablet by mouth two times a day;  #60 x 0;  Signed;  Entered by: Everitt Amber LPN;  Authorized by: [name];  Method used: Handwritten    Prescriptions: TYLOX 5-500 MG CAPS (OXYCODONE-ACETAMINOPHEN) Take 1 tablet by mouth two times a day  #60 x 0   Entered by:   Everitt Amber LPN   Authorized by:   [name]   Signed by:   Everitt Amber LPN on 29/52/8413   Method used:   Handwritten   RxID:   [id number]

## 2010-11-21 NOTE — Progress Notes (Signed)
Summary: rx and pain medicine  Phone Note Call from Patient   Summary of Call: wants to talk to a nurse about her pain medicine and rx  call her back at [phone] Initial call taken by: Lind Guest,  August 31, 2010 1:59 PM  Follow-up for Phone Call        Called patient, her question was already answered by someone else. Follow-up by: Mauricia Area CMA,  September 01, 2010 11:04 AM

## 2010-11-21 NOTE — Progress Notes (Signed)
Summary: PACTCHES AND POT. MEDICNE  Phone Note Call from Patient   Summary of Call: WHEN IN HOSPITAL THEY GAVE HER PATCHES TO HELP HER QUITE SMOKIN AND NOW SHE WANTS A RX FOR THE PATCHES AND WANTS TO KNOW ABOUT HER POT. PILLS  PLEASE CALL HER BACK AT [phone] Initial call taken by: Lind Guest,  December 01, 2009 3:03 PM  Follow-up for Phone Call        advise patches have been sent in , she cannot smoke with them, advise no potassium is on her med list, she needs to get a potassium level checked , chem 7 if nione done recently I will use this to determine what she needs  Follow-up by: [name],  December 01, 2009 5:21 PM  Additional Follow-up for Phone Call Additional follow up Details #1::        patient aware Additional Follow-up by: Worthy Keeler LPN,  December 01, 2009 5:25 PM    New/Updated Medications: NICODERM CQ 14 MG/24HR PT24 (NICOTINE) apply one patch daily for 2 weeks NICODERM CQ 7 MG/24HR PT24 (NICOTINE) apply one pach daily for 2 weeks , start 02/25 2011 Prescriptions: NICODERM CQ 7 MG/24HR PT24 (NICOTINE) apply one pach daily for 2 weeks , start 02/25 2011  #14 x 0   Entered and Authorized by:   [name]   Signed by:   [name] on 12/01/2009   Method used:   Electronically to        [facility]* (retail)       [address]       [place], [place]  [address]       Ph: [phone]       Fax: [phone]   RxID:   [id number] NICODERM CQ 14 MG/24HR PT24 (NICOTINE) apply one patch daily for 2 weeks  #14 x 0   Entered and Authorized by:   [name]   Signed by:   [name] on 12/01/2009   Method used:   Electronically to        [facility]* (retail)       [address]       [place], [place]  [address]       Ph: [phone]       Fax: [phone]   RxID:   [id number]

## 2010-11-21 NOTE — Miscellaneous (Signed)
Summary: Home Care Report  Home Care Report   Imported By: Lind Guest 01/17/2010 09:45:31  _____________________________________________________________________  External Attachment:    Type:   Image     Comment:   External Document

## 2010-11-21 NOTE — Progress Notes (Signed)
Summary: STATEMENT  Phone Note Call from Patient   Summary of Call: NEEDS A STATEMENT  [facility] Initial call taken by: Lind Guest,  Mar 13, 2010 4:19 PM  Follow-up for Phone Call        I recommend that she requests her doc in [place] at the [facility] provide that letter, Follow-up by: [name],  Mar 16, 2010 7:00 PM  Additional Follow-up for Phone Call Additional follow up Details #1::        Called patient, left message  Additional Follow-up by: Everitt Amber LPN,  Mar 17, 2010 2:16 PM    Additional Follow-up for Phone Call Additional follow up Details #2::    called patient left message. WIll notify if she calls back  Follow-up by: Everitt Amber LPN,  Mar 21, 2010 3:01 PM

## 2010-11-21 NOTE — Letter (Signed)
[facility]   Imported By: Lind Guest 08/24/2010 14:19:35  _____________________________________________________________________  External Attachment:    Type:   Image     Comment:   External Document

## 2010-11-21 NOTE — Medication Information (Signed)
Summary: Tax adviser   Imported By: Lind Guest 01/17/2010 10:00:18  _____________________________________________________________________  External Attachment:    Type:   Image     Comment:   External Document

## 2010-11-21 NOTE — Medication Information (Signed)
Summary: Tax adviser   Imported By: Lind Guest 07/07/2010 13:56:42  _____________________________________________________________________  External Attachment:    Type:   Image     Comment:   External Document

## 2010-11-21 NOTE — Medication Information (Signed)
Summary: Tax adviser   Imported By: Lind Guest 12/01/2009 13:42:51  _____________________________________________________________________  External Attachment:    Type:   Image     Comment:   External Document

## 2010-11-21 NOTE — Progress Notes (Signed)
Summary: physical therpy  Phone Note Call from Patient   Summary of Call: physical therp [name] was calling to let doc know that they have tried 4 times to eval and pt has refused all times. [phone] Initial call taken by: Rudene Anda,  December 06, 2009 3:32 PM  Follow-up for Phone Call        noted pls advise to discontinue attempts at service Follow-up by: [name],  December 06, 2009 4:34 PM  Additional Follow-up for Phone Call Additional follow up Details #1::        Phone Call Completed Additional Follow-up by: Adella Hare LPN,  December 07, 2009 10:39 AM

## 2010-11-21 NOTE — Miscellaneous (Signed)
Summary: NARC REFILL  Clinical Lists Changes  Medications: Rx of TYLOX 5-500 MG CAPS (OXYCODONE-ACETAMINOPHEN) Take 1 tablet by mouth two times a day;  #30 x 0;  Signed;  Entered by: Everitt Amber;  Authorized by: [name];  Method used: Handwritten    Prescriptions: TYLOX 5-500 MG CAPS (OXYCODONE-ACETAMINOPHEN) Take 1 tablet by mouth two times a day  #30 x 0   Entered by:   Everitt Amber   Authorized by:   [name]   Signed by:   Everitt Amber on 11/10/2009   Method used:   Handwritten   RxID:   [id number]

## 2010-11-21 NOTE — Letter (Signed)
Summary: take home instructions  take home instructions   Imported By: Lind Guest 09/26/2010 17:05:28  _____________________________________________________________________  External Attachment:    Type:   Image     Comment:   External Document

## 2010-11-21 NOTE — Progress Notes (Signed)
Summary: bp  Phone Note Call from Patient   Summary of Call: went to the dentist and they would not do anything to her bp 157/112 call back at [phone] Initial call taken by: Lind Guest,  May 29, 2010 1:10 PM  Follow-up for Phone Call        please have patient schedule an ov for bp Follow-up by: Adella Hare LPN,  May 29, 2010 1:33 PM  Additional Follow-up for Phone Call Additional follow up Details #1::        COMING IN THE MORNING Additional Follow-up by: Lind Guest,  May 29, 2010 2:56 PM

## 2010-11-21 NOTE — Progress Notes (Signed)
Summary: [facility]  [facility]   Imported By: Lind Guest 01/16/2010 15:01:40  _____________________________________________________________________  External Attachment:    Type:   Image     Comment:   External Document

## 2010-11-21 NOTE — Assessment & Plan Note (Signed)
Summary: pain- room 1   Vital Signs:  Patient profile:   50 year old female Menstrual status:  hysterectomy O2 Sat:      100 % Pulse rate:   119 / minute Resp:     16 per minute BP sitting:   120 / 80  (left arm)  Vitals Entered By: Adella Hare LPN (July 05, 2010 4:39 PM) CC: left thigh pain Is Patient Diabetic? No   Primary Provider:  [name]  CC:  left thigh pain.  History of Present Illness: Hx of Lumbar back pain with radiculopathy LLE.  Increased pain last few days.  No injury or aggrevating factor.  States she is out of her pain meds early.  Really would like the dose increased because 1 a day is not enough.  Pain radiates down buttock, anterior thigh to knee. Sometimes all the way down to her foot.  Burning pain.  No numbness or tingling.  Had MRI in past.  Saw Dr Hilda Lias.  Neurosurg consult not a surgical problem, and recommended Pain Clinic referral and epidurals.  Pt states she has never gone to a pain clinic or had epidurals.  Pt states her sinus congestion is improving with antibiotic from last week.  Is also taking over the counter Omeprazole and heartburn is doing much better.     Allergies (verified): 1)  ! Sulfa 2)  ! Pcn  Past History:  Past medical history reviewed for relevance to current acute and chronic problems.  Past Medical History: Reviewed history from 11/29/2009 and no changes required. Baker's cyst Pulmonary lesion on RUL on CT 1/03 Hemorrhoids Zoster 2001 Asthma GERD HIV disease     M184V 04-13-09 Hypertension ? PCP 11-2009     FAMILY HISTORY DIABETES 1ST DEGREE RELATIVE (ICD-V18.0) TOBACCO USER (ICD-305.1)  ABNORMAL PAP SMEAR, LGSIL (ICD-795.09) HERPES ZOSTER (ICD-053.9) HEMORRHOIDS (ICD-455.6) BAKER'S CYST (ICD-727.51) PANCREATITIS, HX OF (ICD-V12.70) HYPERTENSION (ICD-401.9) GERD (ICD-530.81) ASTHMA (ICD-493.90)  Review of Systems General:  Denies chills and fever. GI:  Denies change in bowel  habits. GU:  Denies dysuria, urinary frequency, and urinary hesitancy. MS:  Complains of low back pain and muscle weakness; denies mid back pain.  Physical Exam  General:  Well-developed,well-nourished,in no acute distress; alert,appropriate and cooperative throughout examination Head:  Normocephalic and atraumatic without obvious abnormalities. No apparent alopecia or balding. Lungs:  Normal respiratory effort, chest expands symmetrically. Lungs are clear to auscultation, no crackles or wheezes. Heart:  Normal rate and regular rhythm. S1 and S2 normal without gallop, murmur, click, rub or other extra sounds. Msk:  LS Spine:  TTP Left L4L5 area, and Lt SI joint.  Neg SLR.   Extremities:  Full strength. Neurologic:  alert & oriented X3, sensation intact to light touch, and DTRs symmetrical and normal.   Psych:  Cognition and judgment appear intact. Alert and cooperative with normal attention span and concentration. No apparent delusions, illusions, hallucinations   Impression & Recommendations:  Problem # 1:  BACK PAIN WITH RADICULOPATHY (ICD-729.2) Assessment Deteriorated  Discussed with pt will refer to pain clinic for possible epidurals. She can discuss her pain meds with them, and if they want to increase dose and take over pain meds rxs they can.  Otherwise she will have to discuss this with Dr Lodema Hong.  Orders: Pain Clinic Referral (Pain)  Complete Medication List: 1)  Ambien 10 Mg Tabs (Zolpidem tartrate) .... Take 1 tablet by mouth at bedtime as needed 2)  Hydroxyzine Hcl 25 Mg Tabs (Hydroxyzine hcl) .Marland KitchenMarland KitchenMarland Kitchen  As needed 3)  Nexium 40 Mg Cpdr (Esomeprazole magnesium) .... Take 1 capsule by mouth once a day 4)  Trizivir 300-150-300 Mg Tabs (Abacavir-lamivudine-zidovudine) .... Take 1 tablet by mouth twice a day 5)  Verapamil Hcl Cr 240 Mg Tbcr (Verapamil hcl) .... Take 1 tablet by mouth once a day 6)  Qvar 40 Mcg/act Aers (Beclomethasone dipropionate) .... 2 puffs two times a day 7)   Xanax 1 Mg Tabs (Alprazolam) .... Take 1 tablet by mouth two times a day as needed 8)  Promethazine Hcl 25 Mg Tabs (Promethazine hcl) .... Take 1 tablet by mouth two times a day as needed 9)  Ventolin Hfa 108 (90 Base) Mcg/act Aers (Albuterol sulfate) .... 2 puffs every 4-6 hrs as needed 10)  Diphenoxylate-atropine 2.5-0.025 Mg Tabs (Diphenoxylate-atropine) .... One tab by mouth once daily prn 11)  Norvir 100 Mg Tabs (Ritonavir) .... Take 1 tablet by mouth once a day 12)  Prezista 400 Mg Tabs (Darunavir ethanolate) .... 2 tab once daily 13)  Gabapentin 300 Mg Caps (Gabapentin) .... One cap by mouth at bedtime 14)  Clonidine Hcl 0.1 Mg Tabs (Clonidine hcl) .... Take 1 tab by mouth at bedtime 15)  Seroquel 100 Mg Tabs (Quetiapine fumarate) .... Take one tablet by mouth at bedtime. 16)  Tylox 5-500 Mg Caps (Oxycodone-acetaminophen) .... Take 1 capsule by mouth once a day 17)  Doxycycline Hyclate 100 Mg Tabs (Doxycycline hyclate) .... Take 1 two times a day x 10 days 18)  Tessalon 200 Mg Caps (Benzonatate) .... Take 1 every 8 hrs as needed for cough  Other Orders: Depo- Medrol 80mg  (J1040) Ketorolac-Toradol 15mg  (V4098) Admin of Therapeutic Inj  intramuscular or subcutaneous (11914)  Patient Instructions: 1)  Please schedule a follow-up appointment in 1 month with Dr Lodema Hong. 2)  You have received 2 shots today for your back pain. 3)  I have referred you to a pain clinic.   Medication Administration  Injection # 1:    Medication: Depo- Medrol 80mg     Diagnosis: HIP PAIN, LEFT (ICD-719.45)    Route: IM    Site: L thigh    Exp Date: 02/2011    Lot #: [id number]    Mfr: Pharm[employer]    Patient tolerated injection without complications    Given by: Adella Hare LPN (July 06, 2010 9:24 AM)  Injection # 2:    Medication: Ketorolac-Toradol 15mg     Diagnosis: HIP PAIN, LEFT (ICD-719.45)    Route: IM    Site: LUOQ gluteus    Exp Date: 12/21/2011    Lot #: [id number]    Mfr: novaplus     Comments: toradol 60mg  given    Patient tolerated injection without complications    Given by: Adella Hare LPN (July 06, 2010 9:25 AM)  Orders Added: 1)  Pain Clinic Referral [Pain] 2)  Est. Patient Level III [57846] 3)  Depo- Medrol 80mg  [J1040] 4)  Ketorolac-Toradol 15mg  [J1885] 5)  Admin of Therapeutic Inj  intramuscular or subcutaneous [96295]

## 2010-11-21 NOTE — Progress Notes (Signed)
Summary: RN shared CD4 adn VL values.  Phone Note Call from Patient Call back at Home Phone [phone]   Caller: Patient Reason for Call: Lab or Test Results Summary of Call: RN shared CD4 and VL information with pt.  Jennet Maduro RN  August 30, 2010 10:17 AM

## 2010-11-21 NOTE — Progress Notes (Signed)
Summary: MEDICINE  Phone Note Call from Patient   Summary of Call: HER DOXYCLCLINE SHE CAN NOT AFFORD IT IS 13.00 AND COULD YOU PLEASE SEND IN SOMETHING CHEAPER Initial call taken by: Lind Guest,  September 11, 2010 1:28 PM  Follow-up for Phone Call        when I called patient to give her some other information she requested answers about her medication, I told her it hasn't been addressed yet and she said "well it needs to be" and hung up. Follow-up by: Curtis Sites,  September 11, 2010 4:56 PM  Additional Follow-up for Phone Call Additional follow up Details #1::        patient called back and asked if this had been addressed yet, I advised patient that Asher Muir was the only one here, and that it hasn't been addressed yet, she said pleae leave msg on machine, she has a doctor's appt this morning. Additional Follow-up by: Curtis Sites,  September 12, 2010 9:19 AM    Additional Follow-up for Phone Call Additional follow up Details #2::    pls lv her a msg that a different med has been sent in  Follow-up by: [name],  September 12, 2010 12:57 PM  Additional Follow-up for Phone Call Additional follow up Details #3:: Details for Additional Follow-up Action Taken: patient aware, thanks Additional Follow-up by: Curtis Sites,  September 12, 2010 2:24 PM

## 2010-11-21 NOTE — Miscellaneous (Signed)
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

## 2010-11-21 NOTE — Letter (Signed)
Summary: allergy & asthma  allergy & asthma   Imported By: Lind Guest 07/18/2010 09:02:04  _____________________________________________________________________  External Attachment:    Type:   Image     Comment:   External Document

## 2010-11-21 NOTE — Letter (Signed)
Summary: NEBULIZER  NEBULIZER   Imported By: Lind Guest 04/18/2010 11:15:11  _____________________________________________________________________  External Attachment:    Type:   Image     Comment:   External Document

## 2010-11-21 NOTE — Miscellaneous (Signed)
Summary: Orders Update  Clinical Lists Changes  Problems: Added new problem of ENCOUNTER FOR LONG-TERM USE OF OTHER MEDICATIONS (ICD-V58.69) Orders: Added new Test order of T- * Misc. Laboratory test (99999) - Signed 

## 2010-11-21 NOTE — Progress Notes (Signed)
Summary: PICK UP RX  Phone Note Call from Patient   Summary of Call: PICK UP RX TOMORROW Initial call taken by: Lind Guest,  August 03, 2010 1:22 PM  Follow-up for Phone Call        noted and ready  Follow-up by: Everitt Amber LPN,  August 03, 2010 1:29 PM

## 2010-11-21 NOTE — Miscellaneous (Signed)
Summary: NARC refill  Clinical Lists Changes  Medications: Rx of TYLOX 5-500 MG CAPS (OXYCODONE-ACETAMINOPHEN) Take 1 capsule by mouth once a day;  #30 x 0;  Signed;  Entered by: Everitt Amber LPN;  Authorized by: [name];  Method used: Handwritten    Prescriptions: TYLOX 5-500 MG CAPS (OXYCODONE-ACETAMINOPHEN) Take 1 capsule by mouth once a day  #30 x 0   Entered by:   Everitt Amber LPN   Authorized by:   [name]   Signed by:   Everitt Amber LPN on 13/05/6577   Method used:   Handwritten   RxID:   [id number]

## 2010-11-21 NOTE — Progress Notes (Signed)
Summary: called doctors  Phone Note Outgoing Call   Summary of Call: Dr. Lodema Hong, called Dr. Kieth Brightly and Dr. Murray Hodgkins, asked them to return your call, gave them the cordless phone number and requested they didn't call between 12-1. Initial call taken by: Curtis Sites,  September 11, 2010 11:10 AM  Follow-up for Phone Call        verbally abusive to staff, and has had overuse off pain meds. he will not prescribe narcs, but is willing  to treat her otherwise Follow-up by: [name],  September 11, 2010 3:52 PM  Additional Follow-up for Phone Call Additional follow up Details #1::        Dr Quintella Baton is aware of her probs and will get back t her  Additional Follow-up by: [name],  September 11, 2010 4:12 PM    Additional Follow-up for Phone Call Additional follow up Details #2::    pls call Safaa and let her know that both docs returned my call and they are aware of her probs that she discussed at the OV today, thanks for your help in maklng this happen  Follow-up by: [name],  September 11, 2010 4:13 PM  Additional Follow-up for Phone Call Additional follow up Details #3:: Details for Additional Follow-up Action Taken: left msg for patient to return my call. Curtis Sites  September 11, 2010 4:54 PM patient returned my call and she was fine with this.  Additional Follow-up by: Curtis Sites,  September 11, 2010 4:55 PM

## 2010-11-21 NOTE — Medication Information (Signed)
Summary: Tax adviser   Imported By: Lind Guest 03/31/2010 16:21:55  _____________________________________________________________________  External Attachment:    Type:   Image     Comment:   External Document

## 2010-11-21 NOTE — Letter (Signed)
Summary: narcotic contract  narcotic contract   Imported By: Lind Guest 05/31/2010 10:52:43  _____________________________________________________________________  External Attachment:    Type:   Image     Comment:   External Document

## 2010-11-21 NOTE — Assessment & Plan Note (Signed)
Summary: OV   Vital Signs:  Patient profile:   50 year old female Menstrual status:  hysterectomy Height:      64 inches O2 Sat:      92 % Pulse rate:   109 / minute Resp:     16 per minute BP sitting:   130 / 98  (left arm) Cuff size:   large  Vitals Entered By: Everitt Amber LPN (March 30, 8118 9:44 AM) CC: Follow up, had the worst pain in her chest after she ate, felt like her food wasn't going down and she was throwing up. Hadn't been sick this morning   CC:  Follow up, had the worst pain in her chest after she ate, and felt like her food wasn't going down and she was throwing up. Hadn't been sick this morning.  Preventive Screening-Counseling & Management  Alcohol-Tobacco     Smoking Cessation Counseling: yes  Allergies (verified): 1)  ! Sulfa 2)  ! Pcn  Review of Systems GU:  Complains of incontinence.   Complete Medication List: 1)  Ambien 10 Mg Tabs (Zolpidem tartrate) .... Take 1 tablet by mouth at bedtime as needed 2)  Hydroxyzine Hcl 25 Mg Tabs (Hydroxyzine hcl) .... As needed 3)  Nexium 40 Mg Cpdr (Esomeprazole magnesium) .... Take 1 capsule by mouth once a day 4)  Trizivir 300-150-300 Mg Tabs (Abacavir-lamivudine-zidovudine) .... Take 1 tablet by mouth twice a day 5)  Verapamil Hcl Cr 240 Mg Tbcr (Verapamil hcl) .... Take 1 tablet by mouth once a day 6)  Qvar 40 Mcg/act Aers (Beclomethasone dipropionate) .... 2 puffs once daily 7)  Xanax 1 Mg Tabs (Alprazolam) .... Take 1 tablet by mouth two times a day as needed 8)  Promethazine Hcl 25 Mg Tabs (Promethazine hcl) .... Take 1 tablet by mouth two times a day as needed 9)  Ventolin Hfa 108 (90 Base) Mcg/act Aers (Albuterol sulfate) .... 2 puffs every 4-6 hrs as needed 10)  Diphenoxylate-atropine 2.5-0.025 Mg Tabs (Diphenoxylate-atropine) .... One tab by mouth once daily prn 11)  Norvir 100 Mg Caps (Ritonavir) .... Take 1 tablet by mouth once a day 12)  Prezista 400 Mg Tabs (Darunavir ethanolate) .... 2 tab once  daily 13)  Gabapentin 300 Mg Caps (Gabapentin) .... One cap by mouth at bedtime 14)  Nicoderm Cq 14 Mg/24hr Pt24 (Nicotine) .... Apply one patch daily for 2 weeks 15)  Nicoderm Cq 7 Mg/24hr Pt24 (Nicotine) .... Apply one pach daily for 2 weeks , start 02/25 2011 16)  Tylox 5-500 Mg Caps (Oxycodone-acetaminophen) .... Take 1 tablet by mouth two times a day 17)  Clonidine Hcl 0.1 Mg Tabs (Clonidine hcl) .... Take 1 tab by mouth at bedtime  Patient Instructions: 1)  F/U in 6 weeks. 2)  YourbOP is slightluy elevated , you will start a new blood pressure med at night time 3)  Tobacco is very bad for your health and your loved ones! You Should stop smoking!. 4)  Stop Smoking Tips: Choose a Quit date. Cut down before the Quit date. decide what you will do as a substitute when you feel the urge to smoke(gum,toothpick,exercise). 5)  It is important that you exercise regularly at least 20 minutes 5 times a week. If you develop chest pain, have severe difficulty breathing, or feel very tired , stop exercising immediately and seek medical attention. 6)  You need to lose weight. Consider a lower calorie diet and regular exercise.  Prescriptions: CLONIDINE HCL 0.1 MG TABS (  CLONIDINE HCL) Take 1 tab by mouth at bedtime  #30 x 2   Entered and Authorized by:   [name]   Signed by:   [name] on 03/30/2010   Method used:   Electronically to        [facility] (retail)       [address]       [place], [place]  [address]       Ph: [phone]       Fax: [phone]   RxID:   [id number]

## 2010-11-21 NOTE — Progress Notes (Signed)
Summary: rx   Phone Note Call from Patient   Summary of Call: pt will come by in tomorrow and pick up her rx.  [phone] Initial call taken by: Rudene Anda,  February 08, 2010 3:16 PM  Follow-up for Phone Call        noted Follow-up by: Adella Hare LPN,  February 08, 2010 3:41 PM

## 2010-11-21 NOTE — Progress Notes (Signed)
  Phone Note Call from Patient   Caller: Patient Summary of Call: patient very upset, states had epidural injections tues and now having fever, shakes, and not feeling good per dr Lodema Hong, advised patient to contact office that did epidural, if no response go to urgent care.  Initial call taken by: Adella Hare LPN,  August 17, 2010 4:34 PM

## 2010-11-21 NOTE — Miscellaneous (Signed)
Summary: [facility] REFILL  Clinical Lists Changes  Medications: Added new medication of TYLOX 5-500 MG CAPS (OXYCODONE-ACETAMINOPHEN) Take 1 tablet by mouth two times a day - Signed Rx of TYLOX 5-500 MG CAPS (OXYCODONE-ACETAMINOPHEN) Take 1 tablet by mouth two times a day;  #60 x 0;  Signed;  Entered by: Everitt Amber LPN;  Authorized by: [name];  Method used: Handwritten    Prescriptions: TYLOX 5-500 MG CAPS (OXYCODONE-ACETAMINOPHEN) Take 1 tablet by mouth two times a day  #60 x 0   Entered by:   Everitt Amber LPN   Authorized by:   [name]   Signed by:   Everitt Amber LPN on 16/07/9603   Method used:   Handwritten   RxID:   [id number]

## 2010-11-21 NOTE — Miscellaneous (Signed)
  Clinical Lists Changes  Observations: Added new observation of YEARAIDSPOS: 2002  (09/05/2010 16:20)

## 2010-11-21 NOTE — Medication Information (Signed)
Summary: Tax adviser   Imported By: Lind Guest 12/26/2009 15:02:48  _____________________________________________________________________  External Attachment:    Type:   Image     Comment:   External Document

## 2010-11-21 NOTE — Letter (Signed)
Summary: Letter  Letter   Imported By: Lind Guest 06/23/2010 16:13:13  _____________________________________________________________________  External Attachment:    Type:   Image     Comment:   External Document

## 2010-11-22 ENCOUNTER — Ambulatory Visit
Admission: RE | Admit: 2010-11-22 | Discharge: 2010-11-22 | Disposition: A | Discharge status: Home / Self Care | Payer: Medicare Other | Source: Ambulatory Visit | Attending: Neurosurgery | Admitting: Neurosurgery

## 2010-11-22 DIAGNOSIS — M545 Low back pain: Secondary | ICD-10-CM

## 2010-11-23 NOTE — Progress Notes (Signed)
Summary: shot  Phone Note Call from Patient   Summary of Call: needs a shot that  will help her move around and wanted to know who is on call and helping when the dr is not in  call her back at [phone] Initial call taken by: Lind Guest,  October 25, 2010 11:50 AM  Follow-up for Phone Call        NO ON CALL [facility] PHYSICALLY SEEING PATIENTS  PATIENT NEEDS TO GO TO ER OR URGENT CARE  RETURNED CALL, LEFT MESSAGE Follow-up by: Adella Hare LPN,  October 25, 2010 2:33 PM  Additional Follow-up for Phone Call Additional follow up Details #1::        RETURNED CALL, LEFT MESSAGE Additional Follow-up by: Adella Hare LPN,  October 26, 2010 2:13 PM

## 2010-11-23 NOTE — Progress Notes (Signed)
Summary: appts  Phone Note Call from Patient   Summary of Call: she called about dr. Penelope Galas and calling to see about her getting a surgeon for her back and she had tried to call several times and they would not returning her call and wanted me to call and check so i did and there will be a lady named lindsay to call this wekk she is out today had to rsch her appt with dr.keeling it is for 1.31.2012 @ 2:25 patient is aware  Initial call taken by: Lind Guest,  October 17, 2010 9:40 AM  Follow-up for Phone Call        noted Follow-up by: [name],  October 17, 2010 12:03 PM

## 2010-11-23 NOTE — Progress Notes (Signed)
Summary: back pain  Phone Note Call from Patient   Summary of Call: patient call in today wanted to know could she get a muscle relaxer for her back. until she able to see Dr Kathlen Brunswick for her back  Initial call taken by: Eugenio Hoes,  October 31, 2010 9:21 AM  Follow-up for Phone Call        pls advise cyclobenz sent in for back spasm Follow-up by: [name],  November 01, 2010 4:27 AM  Additional Follow-up for Phone Call Additional follow up Details #1::        Phone Call Completed Additional Follow-up by: Adella Hare LPN,  November 01, 2010 9:46 AM    New/Updated Medications: CYCLOBENZAPRINE HCL 10 MG TABS (CYCLOBENZAPRINE HCL) Take 1 tab by mouth at bedtime as needed for back spasm Prescriptions: CYCLOBENZAPRINE HCL 10 MG TABS (CYCLOBENZAPRINE HCL) Take 1 tab by mouth at bedtime as needed for back spasm  #30 x 0   Entered and Authorized by:   [name]   Signed by:   [name] on 11/01/2010   Method used:   Electronically to        [facility] (retail)       [address]       [place], [place]  [address]       Ph: [phone]       Fax: [phone]   RxID:   [id number]

## 2010-11-23 NOTE — Progress Notes (Signed)
Summary: wants pain medicine  Phone Note Call from Patient   Summary of Call: wants some pain medicine her appt with bako is next thursday and needs something to last till then  [place] apot call back at [phone] Initial call taken by: Lind Guest,  November 02, 2010 1:23 PM  Follow-up for Phone Call        pls refill meloxicam x 1 and let her know med sent in,( no other pain med from here, no vicodn or codeine product, has to wwait to see Dr Penelope Galas) Follow-up by: [name],  November 03, 2010 12:15 PM  Additional Follow-up for Phone Call Additional follow up Details #1::        Patient aware Additional Follow-up by: Everitt Amber LPN,  November 03, 2010 1:52 PM    Prescriptions: MELOXICAM 15 MG TABS (MELOXICAM) Take 1 tablet by mouth once a day as needed for uncontrolled pain  #30 x 0   Entered by:   Everitt Amber LPN   Authorized by:   [name]   Signed by:   Everitt Amber LPN on 08/18/2535   Method used:   Electronically to        [facility] (retail)       [address]       [place], [place]  [address]       Ph: [phone]       Fax: [phone]   RxID:   [id number]

## 2010-11-23 NOTE — Assessment & Plan Note (Signed)
Summary: F/U/VS   Primary Provider:  [name]  CC:  follow-up visit, pt c/o severe pain left hip radiating down leg, B/P elevated, and c/o  nightmares .  History of Present Illness: 50 yo F with long hx of HIV+, hx of hospitalization in [place] earlier this year with suspicion of PCP.   CD4 820 and VL 2630 (08-11-10).Genotype M184V. TV/DRVr.  Is having continued problems with back pain and radicular leg pain.  c/o pain in her L leg- spasms. Almost unable to walk back to exam room. Has appt scheduled with Ortho next week.   Preventive Screening-Counseling & Management  Alcohol-Tobacco     Alcohol drinks/day: occassionally     Alcohol type: beer/wine     Smoking Status: current     Smoking Cessation Counseling: yes     Smoke Cessation Stage: contemplative     Packs/Day: 0.5  Caffeine-Diet-Exercise     Caffeine use/day: soda occassionally     Does Patient Exercise: no  Hep-HIV-STD-Contraception     HIV Risk Counseling: 11/28/2005  Safety-Violence-Falls     Seat Belt Use: yes      Drug Use:  current, marijuana, and smokes every now and then.    Comments: pt. given condoms   Updated Prior Medication List: AMBIEN 10 MG TABS (ZOLPIDEM TARTRATE) Take 1 tablet by mouth at bedtime as needed HYDROXYZINE HCL 25 MG TABS (HYDROXYZINE HCL) as needed NEXIUM 40 MG CPDR (ESOMEPRAZOLE MAGNESIUM) Take 1 capsule by mouth once a day TRIZIVIR 300-150-300 MG TABS (ABACAVIR-LAMIVUDINE-ZIDOVUDINE) Take 1 tablet by mouth twice a day VERAPAMIL HCL CR 240 MG TBCR (VERAPAMIL HCL) Take 1 tablet by mouth once a day QVAR 40 MCG/ACT AERS (BECLOMETHASONE DIPROPIONATE) 2 puffs two times a day XANAX 1 MG  TABS (ALPRAZOLAM) Take 1 tablet by mouth two times a day as needed PROMETHAZINE HCL 25 MG TABS (PROMETHAZINE HCL) Take 1 tablet by mouth two times a day as needed VENTOLIN HFA 108 (90 BASE) MCG/ACT AERS (ALBUTEROL SULFATE) 2 puffs every 4-6 hrs as needed DIPHENOXYLATE-ATROPINE  2.5-0.025 MG TABS (DIPHENOXYLATE-ATROPINE) one tab by mouth once daily prn NORVIR 100 MG TABS (RITONAVIR) Take 1 tablet by mouth once a day PREZISTA 400 MG TABS (DARUNAVIR ETHANOLATE) 2 tab once daily GABAPENTIN 300 MG CAPS (GABAPENTIN) one cap by mouth at bedtime CLONIDINE HCL 0.1 MG TABS (CLONIDINE HCL) Take 1 tab by mouth at bedtime SEROQUEL 100 MG TABS (QUETIAPINE FUMARATE) Take one tablet by mouth at bedtime. VICODIN ES 7.5-750 MG TABS (HYDROCODONE-ACETAMINOPHEN) Take 1 tablet by mouth two times a day as needed for uncontrolled pain MELOXICAM 15 MG TABS (MELOXICAM) Take 1 tablet by mouth once a day as needed for uncontrolled pain CYCLOBENZAPRINE HCL 10 MG TABS (CYCLOBENZAPRINE HCL) Take 1 tab by mouth at bedtime as needed for back spasm  Current Allergies (reviewed today): ! SULFA ! PCN Review of Systems       wt unchanged. no fevers, feels cold. having nightmares.   Vital Signs:  Patient profile:   50 year old female Menstrual status:  hysterectomy Height:      64 inches (162.56 cm) Weight:      191.4 pounds (87 kg) BMI:     32.97 Temp:     97.5 degrees F (36.39 degrees C) oral Pulse rate:   114 / minute BP sitting:   146 / 105  (right arm)  Vitals Entered By: Wendall Mola CMA Duncan Dull) (November 15, 2010 11:54 AM) CC: follow-up visit, pt c/o severe  pain left hip radiating down leg, B/P elevated, c/o  nightmares  Is Patient Diabetic? No Pain Assessment Patient in pain? yes     Location: left hip Intensity: 10 Type: aching Onset of pain  Intermittent Nutritional Status BMI of > 30 = obese Nutritional Status Detail appetite "good"  Have you ever been in a relationship where you felt threatened, hurt or afraid?No   Does patient need assistance? Functional Status Self care Ambulation Normal Comments pt. has missed 2-3 doses of HARRT meds   Physical Exam  General:  well-developed, well-nourished, well-hydrated, and overweight-appearing.   Eyes:  pupils equal,  pupils round, and pupils reactive to light.   Mouth:  pharynx pink and moist and no exudates.   Neck:  no masses.   Lungs:  normal respiratory effort and normal breath sounds.   Heart:  normal rate, regular rhythm, and no murmur.   Abdomen:  soft, non-tender, and normal bowel sounds.     Impression & Recommendations:  Problem # 1:  HIV DISEASE (ICD-042) states she has skipped some some doses. will recheck her labs today. needs flu shot. offered condoms. return to clinic 4 months The following medications were removed from the medication list:    Fluconazole 150 Mg Tabs (Fluconazole) .Marland Kitchen... Take 1 tablet by mouth once a day as needed for itching    Levaquin 500 Mg Tabs (Levofloxacin) .Marland Kitchen... 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 will refill her pain meds til she has her appt.   Problem # 3:  TOBACCO USER (ICD-305.1) counseled to quit  Problem # 4:  DEPRESSION (ICD-311) she is quite agitated today. has f/u with psychiatrist.  Her updated medication list for this problem includes:    Hydroxyzine Hcl 25 Mg Tabs (Hydroxyzine hcl) .Marland Kitchen... As needed    Xanax 1 Mg Tabs (Alprazolam) .Marland Kitchen... Take 1 tablet by mouth two times a day as needed  Other Orders: Est. Patient Level IV (16109) T-CD4SP [facility]) (CD4SP) T-HIV Viral Load [phone]) T-Comprehensive Metabolic Panel [phone]) T-CBC w/Diff [phone]) T-RPR (Syphilis) [phone]) T-Lipid Profile ([phone])  Prescriptions: VICODIN ES 7.5-750 MG TABS (HYDROCODONE-ACETAMINOPHEN) Take 1 tablet by mouth two times a day as needed for uncontrolled pain  #20 x 0   Entered and Authorized by:   Johny Sax MD   Signed by:   Johny Sax MD on 11/15/2010   Method used:   Handwritten   RxID:   [id number]         Medication Adherence: 11/15/2010   Adherence to medications reviewed with patient. Counseling to provide adequate adherence provided     Prevention For Positives: 11/15/2010   Safe sex practices discussed with patient. Condoms offered.                              Not Administered:    Influenza Vaccine not given due to: declined   Appended Document: Immunization Entry refuses flu shot.

## 2010-11-23 NOTE — Assessment & Plan Note (Signed)
Summary: OV   Vital Signs:  Patient profile:   50 year old female Menstrual status:  hysterectomy Pulse rate:   56 / minute Pulse rhythm:   regular BP sitting:   130 / 86  (right arm)  Vitals Entered By: Adella Hare LPN (September 26, 2010 1:20 PM) CC: er follow up Is Patient Diabetic? No Comments did not bring meds to ov   Primary Care Kemiya Batdorf:  [name]  CC:  er follow up.  History of Present Illness: pt fell on left side last weekend when her dog was playing with her , she got sprained ankles x 2 and knees are injured.while on thegrd he doveon her right side. seen in the ed, states she cannot wear boots prescribed, was prescribed 8 pain meds, states she cannot function wants ortho eval and pain management.   Allergies (verified): 1)  ! Sulfa 2)  ! Pcn  Review of Systems      See HPI General:  Complains of fatigue. Eyes:  Denies discharge and red eye. ENT:  Denies nasal congestion, sinus pressure, and sore throat. CV:  Denies chest pain or discomfort and palpitations. Resp:  Denies sputum productive. GI:  Denies abdominal pain, constipation, diarrhea, nausea, and vomiting. GU:  Denies dysuria and urinary frequency. MS:  Complains of joint pain, low back pain, mid back pain, muscle aches, and stiffness. Psych:  Complains of anxiety, depression, easily angered, irritability, and mental problems; denies suicidal thoughts/plans, thoughts of violence, and thoughts /plans of harming others. Heme:  Denies abnormal bruising, bleeding, enlarge lymph nodes, and fevers. Allergy:  Complains of seasonal allergies.  Physical Exam  General:  Well-developed,well-nourished,in no acute distress; alert,appropriate and cooperative throughout examination HEENT: No facial asymmetry,  EOMI, No sinus tenderness, TM's Clear, oropharynx  pink and moist. reduced ROM neck  Chest: Clear to auscultation bilaterally.  CVS: S1, S2, No murmurs, No S3.   Abd: Soft, Nontender.  MS:  decreased  ROM spine, and ankles,adequate in  hips, shoulders and knees. tender over anterior chest Ext: No edema.   CNS: CN 2-12 intact, power tone and sensation normal throughout.   Skin: Intact, no visible lesions or rashes.  Psych: Good eye contact, normal affect.  Memory intact, not anxious or depressed appearing.]    Impression & Recommendations:  Problem # 1:  ANKLE SPRAIN (ICD-845.00) Assessment Comment Only  The following medications were removed from the medication list:    Tylox 5-500 Mg Caps (Oxycodone-acetaminophen) .Marland Kitchen... Take 1 capsule by mouth once a day Her updated medication list for this problem includes:    Vicodin Es 7.5-750 Mg Tabs (Hydrocodone-acetaminophen) .Marland Kitchen... Take 1 tablet by mouth two times a day as needed for uncontrolled pain    Meloxicam 15 Mg Tabs (Meloxicam) .Marland Kitchen... 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 As needed    Xanax 1 Mg Tabs (Alprazolam) .Marland Kitchen... Take 1 tablet by mouth two times a day as needed needs to continue with psych  Problem # 4:  HYPERTENSION (ICD-401.9) Assessment: Unchanged  Her updated medication list for this  problem includes:    Verapamil Hcl Cr 240 Mg Tbcr (Verapamil hcl) .Marland Kitchen... Take 1 tablet by mouth once a day    Clonidine Hcl 0.1 Mg Tabs (Clonidine hcl) .Marland Kitchen... Take 1 tab by mouth at bedtime  BP today: 130/86 Prior BP: 112/80 (09/11/2010)  Labs Reviewed: K+: 4.0 (08/02/2010) Creat: : 0.73 (08/02/2010)   Chol: 262 (12/07/2009)   HDL: 66  (12/07/2009)   LDL: * mg/dL (04/54/0981)   TG: 191 (12/07/2009)  Complete Medication List: 1)  Ambien 10 Mg Tabs (Zolpidem tartrate) .... Take 1 tablet by mouth at bedtime as needed 2)  Hydroxyzine Hcl 25 Mg Tabs (Hydroxyzine hcl) .... As needed 3)  Nexium 40 Mg Cpdr (Esomeprazole magnesium) .... Take 1 capsule by mouth once a day 4)  Trizivir 300-150-300 Mg Tabs (Abacavir-lamivudine-zidovudine) .... Take 1 tablet by mouth twice a day 5)  Verapamil Hcl Cr 240 Mg Tbcr (Verapamil hcl) .... Take 1 tablet by mouth once a day 6)  Qvar 40 Mcg/act Aers (Beclomethasone dipropionate) .... 2 puffs two times a day 7)  Xanax 1 Mg Tabs (Alprazolam) .... Take 1 tablet by mouth two times a day as needed 8)  Promethazine Hcl 25 Mg Tabs (Promethazine hcl) .... Take 1 tablet by mouth two times a day as needed 9)  Ventolin Hfa 108 (90 Base) Mcg/act Aers (Albuterol sulfate) .... 2 puffs every 4-6 hrs as needed 10)  Diphenoxylate-atropine 2.5-0.025 Mg Tabs (Diphenoxylate-atropine) .... One tab by mouth once daily prn 11)  Norvir 100 Mg Tabs (Ritonavir) .... Take 1 tablet by mouth once a day 12)  Prezista 400 Mg Tabs (Darunavir ethanolate) .... 2 tab once daily 13)  Gabapentin 300 Mg Caps (Gabapentin) .... One cap by mouth at bedtime 14)  Clonidine Hcl 0.1 Mg Tabs (Clonidine hcl) .... Take 1 tab by mouth at bedtime 15)  Seroquel 100 Mg Tabs (Quetiapine fumarate) .... Take one tablet by mouth at bedtime. 16)  Tessalon Perles 100 Mg Caps (Benzonatate) .... Take 1 capsule by mouth three times a day 17)  Fluconazole 150 Mg Tabs (Fluconazole) .... Take 1 tablet by mouth once a day as needed for itching 18)  Levaquin 500 Mg Tabs (Levofloxacin) .... Take 1 tablet by mouth once a day 19)  Vicodin Es 7.5-750 Mg Tabs (Hydrocodone-acetaminophen) .... Take 1 tablet by mouth two times a day as needed for uncontrolled pain 20)  Meloxicam 15 Mg Tabs (Meloxicam) .... Take 1 tablet by mouth once a day as needed for uncontrolled  pain  Patient Instructions: 1)  f/u as before. 2)  you will get injection of toradol for your aipn, and med is also sent in Prescriptions: MELOXICAM 15 MG TABS (MELOXICAM) Take 1 tablet by mouth once a day as needed for uncontrolled pain  #30 x 0   Entered and Authorized by:   [name]   Signed by:   [name] on 09/26/2010   Method used:   Electronically to        [facility] (retail)       [address]       [place], [place]  [address]       Ph: [phone]       Fax: [phone]   RxID:   [id number] VICODIN ES 7.5-750 MG TABS (HYDROCODONE-ACETAMINOPHEN) Take 1 tablet by mouth two times a day as needed for uncontrolled pain  #10 x 0   Entered and Authorized by:  [name]   Signed by:   [name] on 09/26/2010   Method used:   Printed then faxed to ...       [facility] (retail)       [address]       [place], [place]  [address]       Ph: [phone]       Fax: [phone]   RxID:   [id number]    Medication Administration  Injection # 1:    Medication: Ketorolac-Toradol 15mg     Diagnosis: ANKLE SPRAIN (ICD-845.00)    Route: IM    Site: RUOQ gluteus    Exp Date: 08/23/2011    Lot #: [id number]    Mfr: [email or web address]    Comments: toradol 60mg  given    Patient tolerated injection without complications    Given by: Adella Hare LPN (September 26, 2010 5:09 PM)  Orders Added: 1)  Est. Patient Level III [99213] 2)  Orthopedic Referral [Ortho] 3)  Ketorolac-Toradol 15mg  [J1885] 4)  Admin of Therapeutic Inj  intramuscular or subcutaneous [96372] 5)  Medicare Electronic Prescription [G8553]     Medication Administration  Injection # 1:    Medication: Ketorolac-Toradol 15mg     Diagnosis: ANKLE SPRAIN (ICD-845.00)    Route: IM    Site: RUOQ gluteus    Exp Date: 08/23/2011    Lot #: [id number]    Mfr: [email or web address]    Comments: toradol 60mg  given     Patient tolerated injection without complications    Given by: Adella Hare LPN (September 26, 2010 5:09 PM)  Orders Added: 1)  Est. Patient Level III [66063] 2)  Orthopedic Referral [Ortho] 3)  Ketorolac-Toradol 15mg  [J1885] 4)  Admin of Therapeutic Inj  intramuscular or subcutaneous [96372] 5)  Medicare Electronic Prescription [K1601]

## 2010-11-23 NOTE — Letter (Signed)
[facility]: [facility]   Imported By: Lind Guest 11/07/2010 13:31:01  _____________________________________________________________________  External Attachment:    Type:   Image     Comment:   External Document

## 2010-11-28 ENCOUNTER — Encounter (INDEPENDENT_AMBULATORY_CARE_PROVIDER_SITE_OTHER): Payer: Medicare Other | Admitting: Psychiatry

## 2010-11-28 DIAGNOSIS — F3189 Other bipolar disorder: Secondary | ICD-10-CM

## 2010-12-12 ENCOUNTER — Ambulatory Visit: Payer: Self-pay | Admitting: Family Medicine

## 2010-12-19 ENCOUNTER — Encounter (HOSPITAL_COMMUNITY): Payer: Medicare Other | Admitting: Psychology

## 2010-12-25 ENCOUNTER — Other Ambulatory Visit: Payer: Self-pay | Admitting: Orthopaedic Surgery

## 2010-12-25 ENCOUNTER — Encounter (HOSPITAL_COMMUNITY): Payer: Medicare Other

## 2010-12-25 LAB — DIFFERENTIAL
Basophils Relative: 0 % (ref 0–1)
Eosinophils Absolute: 0 10*3/uL (ref 0.0–0.7)
Eosinophils Relative: 1 % (ref 0–5)
Monocytes Relative: 10 % (ref 3–12)
Neutrophils Relative %: 40 % — ABNORMAL LOW (ref 43–77)

## 2010-12-25 LAB — COMPREHENSIVE METABOLIC PANEL
Albumin: 3.7 g/dL (ref 3.5–5.2)
Alkaline Phosphatase: 91 U/L (ref 39–117)
BUN: 13 mg/dL (ref 6–23)
Potassium: 4.3 mEq/L (ref 3.5–5.1)
Total Protein: 7.5 g/dL (ref 6.0–8.3)

## 2010-12-25 LAB — CBC
Platelets: 222 10*3/uL (ref 150–400)
RBC: 4.14 MIL/uL (ref 3.87–5.11)
WBC: 6 10*3/uL (ref 4.0–10.5)

## 2010-12-25 LAB — URINALYSIS, ROUTINE W REFLEX MICROSCOPIC
Ketones, ur: NEGATIVE mg/dL
Nitrite: NEGATIVE
Specific Gravity, Urine: 1.01 (ref 1.005–1.030)
pH: 7 (ref 5.0–8.0)

## 2010-12-25 LAB — SURGICAL PCR SCREEN: MRSA, PCR: NEGATIVE

## 2010-12-27 ENCOUNTER — Encounter (INDEPENDENT_AMBULATORY_CARE_PROVIDER_SITE_OTHER): Payer: Medicare Other | Admitting: Psychology

## 2010-12-27 DIAGNOSIS — F319 Bipolar disorder, unspecified: Secondary | ICD-10-CM

## 2010-12-28 ENCOUNTER — Ambulatory Visit (HOSPITAL_COMMUNITY)
Admission: RE | Admit: 2010-12-28 | Discharge: 2010-12-28 | Disposition: A | Discharge status: Home / Self Care | Payer: Medicare Other | Source: Ambulatory Visit | Attending: Orthopaedic Surgery | Admitting: Orthopaedic Surgery

## 2010-12-28 ENCOUNTER — Other Ambulatory Visit: Payer: Self-pay | Admitting: Orthopaedic Surgery

## 2010-12-28 DIAGNOSIS — I10 Essential (primary) hypertension: Secondary | ICD-10-CM | POA: Insufficient documentation

## 2010-12-28 DIAGNOSIS — M23302 Other meniscus derangements, unspecified lateral meniscus, unspecified knee: Secondary | ICD-10-CM | POA: Insufficient documentation

## 2010-12-28 DIAGNOSIS — J449 Chronic obstructive pulmonary disease, unspecified: Secondary | ICD-10-CM | POA: Insufficient documentation

## 2010-12-28 DIAGNOSIS — J4489 Other specified chronic obstructive pulmonary disease: Secondary | ICD-10-CM | POA: Insufficient documentation

## 2010-12-28 DIAGNOSIS — Z79899 Other long term (current) drug therapy: Secondary | ICD-10-CM | POA: Insufficient documentation

## 2010-12-28 DIAGNOSIS — Z01812 Encounter for preprocedural laboratory examination: Secondary | ICD-10-CM | POA: Insufficient documentation

## 2010-12-28 DIAGNOSIS — Z01818 Encounter for other preprocedural examination: Secondary | ICD-10-CM | POA: Insufficient documentation

## 2011-01-01 ENCOUNTER — Telehealth (INDEPENDENT_AMBULATORY_CARE_PROVIDER_SITE_OTHER): Payer: Self-pay | Admitting: *Deleted

## 2011-01-01 LAB — CBC
HCT: 37.2 % (ref 36.0–46.0)
Hemoglobin: 13.1 g/dL (ref 12.0–15.0)
MCV: 87.7 fL (ref 78.0–100.0)
RBC: 4.24 MIL/uL (ref 3.87–5.11)
WBC: 6.9 10*3/uL (ref 4.0–10.5)

## 2011-01-01 LAB — POCT CARDIAC MARKERS: Myoglobin, poc: 32.1 ng/mL (ref 12–200)

## 2011-01-01 LAB — BASIC METABOLIC PANEL
BUN: 15 mg/dL (ref 6–23)
Chloride: 99 mEq/L (ref 96–112)
GFR calc Af Amer: >60 mL/min (ref >60)
Potassium: 3.5 mEq/L (ref 3.5–5.1)
Sodium: 135 mEq/L (ref 135–145)

## 2011-01-01 LAB — DIFFERENTIAL
Eosinophils Relative: 0 % (ref 0–5)
Lymphocytes Relative: 25 % (ref 12–46)
Lymphs Abs: 1.7 10*3/uL (ref 0.7–4.0)
Monocytes Absolute: 0.4 10*3/uL (ref 0.1–1.0)
Monocytes Relative: 6 % (ref 3–12)

## 2011-01-02 ENCOUNTER — Ambulatory Visit: Payer: Medicare Other | Admitting: Family Medicine

## 2011-01-02 NOTE — Op Note (Signed)
NAMEJORDEN, [name]             ACCOUNT NO.:  [id number]  MEDICAL RECORD NO.:  [id number]           PATIENT TYPE:  O  LOCATION:  DAYP                          FACILITY:  [facility]  PHYSICIAN:  J. Darreld Mclean, M.D. DATE OF BIRTH:  [birth year]  DATE OF PROCEDURE: DATE OF DISCHARGE:                              OPERATIVE REPORT   PREOPERATIVE DIAGNOSIS:  Tear of lateral meniscus, left knee.  POSTOPERATIVE DIAGNOSIS:  Tear of lateral meniscus, left knee.  PROCEDURE:  Operative arthroscopy, partial lateral meniscectomy of the left knee.  ANESTHESIA:  General.  TOURNIQUET TIME:  17 minutes.  DRAINS:  None.  SURGEON:  J. Darreld Mclean, MD  INDICATIONS:  The patient is a 50 year old female with pain and tenderness in her knee.  This is locking, giving way pain and tenderness.  MRI showed tear of the lateral meniscus of the knee. Recommended surgery of the knee and she elected to have surgery done in this state.  Risks and imponderables were discussed with the patient preoperatively.  She asked appropriate questions and agreed to the procedure.  DESCRIPTION OF PROCEDURE:  The patient was seen in the holding area, identified the left knee as correct surgical site.  She placed a mark on the left knee.  I placed a mark on the left knee.  She was brought to the operating room, given general anesthesia while supine.  Tourniquet and leg holder were placed, deflated the left upper thigh.  She prepped and draped in the usual manner.  A generalized time-out identifying the patient as [name], doing her left knee for a lateral meniscectomy. Operating room team knew each other.  All instrumentation was deemed to be properly positioned and correctly working.  The leg was then elevated and wrapped circumferentially using an Esmarch bandage.  Tourniquet was inflated to 300 mmHg.  Esmarch bandage was removed.  Inflow cannula was inserted immediately.  Lactated Ringer's instilled in the knee  via an infusion pump.  Arthroscope was inserted laterally.  The knee was systematically examined.  Suprapatellar pouch had some mild synovitis.  The surface of the patella had grade 2-3 changes.  Medially the knee looked good without any tear of the cartilage mainly above the meniscus and the articular cartilage looked good with a grade 2 changes.  Anterior cruciate was intact laterally.  She had a diffuse stellate tear in the posterior portion of the lateral meniscus.  She also had eburnation of bone and grade 4 changes, the tibial plateau and distal femoral condyle on this area. Using meniscal shaver, good smooth contour was obtained and a meniscal punch.  The meniscus was removed from this area without any difficulty. Prompt pictures were taken throughout the procedure.  Knee was systematically reexamined and no pathology was found.  The wound was irrigated with lactated Ringer's.  Wound was reapproximated using 3-0 nylon interrupted vertical mattress manner.  Marcaine 0.25% was instilled in each portal.  Tourniquet was deflated after 17 minutes. The patient tolerated the procedure well, will go to recovery in good condition.  I have given a prescription for Norco.  She already has physical therapy arranged.  If  any difficulties, should contact me through the office hospital beeper system.          ______________________________ Shela Commons. Darreld Mclean, M.D.     JWK/MEDQ  D:  12/28/2010  T:  12/29/2010  Job:  [id number]  Electronically Signed by Darreld Mclean M.D. on 01/02/2011 07:27:30 AM

## 2011-01-02 NOTE — H&P (Signed)
  NAMEADALYNN, [name]             ACCOUNT NO.:  [id number]  MEDICAL RECORD NO.:  [id number]           PATIENT TYPE:  O  LOCATION:  DAY                           FACILITY:  [facility]  PHYSICIAN:  J. Darreld Mclean, M.D. DATE OF BIRTH:  31-Oct-1960  DATE OF ADMISSION:  12/27/2010 DATE OF DISCHARGE:  LH                             HISTORY & PHYSICAL   CHIEF COMPLAINT:  "My left knee hurts."  HISTORY OF PRESENT ILLNESS:  The patient is a 50 year old female with pain and tenderness in her left knee.  It has been giving way.  She says locking one episode.  She has recurring swelling.  She says poor progress with antiinflammatories and conservative treatment.  MRI was done on the left knee at the hospital which showed a tear of the lateral meniscus.  I saw in the office on February 24, went over the MRI and recommended surgery.  She has elected to have arthroscopy, electively done on March 8.  I have gone over the procedure, the risks, and imponderables with her.  CURRENT MEDICATIONS: 1. Flexeril 1 tablet every 8 hours p.r.n. spasms. 2. Voltaren 75 one p.o. b.i.d. p.c. 3. Vicodin 5/500 every 4 hours p.r.n. pain.  PAST MEDICAL HISTORY:  The patient has a history of secretory problems, hypertension, cigarette smoking, COPD, asthma, anxiety, and depression.  ALLERGIES:  Allergic to SULFA.  She is status post right breast surgery, biopsy, hysterectomy, and cholecystectomy.  She does drink coffee and she does smoke.  Dr. Lodema Hong, is a family doctor.  The patient also has a history of anxiety and bipolar, depression, and GERD.  SOCIAL HISTORY:  Negative.  PHYSICAL EXAMINATION:  GENERAL:  The patient is alert, cooperative, and oriented. HEENT:  Negative. NECK:  Supple. LUNGS:  Clear to P and A. HEART:  Regular without murmur heard. ABDOMEN:  Soft, nontender, without masses. EXTREMITIES:  She has pain and tenderness in the left knee with positive lateral McMurray.  She has swelling to  the knee with slight crepitus. Other extremities within normal limits. CNS:  Intact. SKIN:  Intact.  IMPRESSION:  Tear in the left knee lateral meniscus.  PLAN:  Operative arthroscopy of the left knee.  Labs are pending.                                           ______________________________ J. Darreld Mclean, M.D.     JWK/MEDQ  D:  12/27/2010  T:  12/27/2010  Job:  [id number]  Electronically Signed by Darreld Mclean M.D. on 01/02/2011 07:27:25 AM

## 2011-01-03 ENCOUNTER — Telehealth: Payer: Self-pay | Admitting: Family Medicine

## 2011-01-03 ENCOUNTER — Ambulatory Visit (INDEPENDENT_AMBULATORY_CARE_PROVIDER_SITE_OTHER): Payer: Medicare Other | Admitting: Family Medicine

## 2011-01-03 ENCOUNTER — Encounter: Payer: Self-pay | Admitting: Family Medicine

## 2011-01-03 DIAGNOSIS — E669 Obesity, unspecified: Secondary | ICD-10-CM

## 2011-01-03 DIAGNOSIS — J029 Acute pharyngitis, unspecified: Secondary | ICD-10-CM

## 2011-01-03 DIAGNOSIS — F17200 Nicotine dependence, unspecified, uncomplicated: Secondary | ICD-10-CM

## 2011-01-03 LAB — T-HELPER CELL (CD4) - (RCID CLINIC ONLY): CD4 T Cell Abs: 820 uL (ref 400–2700)

## 2011-01-07 LAB — DIFFERENTIAL
Basophils Absolute: 0 10*3/uL (ref 0.0–0.1)
Basophils Absolute: 0 10*3/uL (ref 0.0–0.1)
Basophils Absolute: 0 10*3/uL (ref 0.0–0.1)
Basophils Relative: 0 % (ref 0–1)
Basophils Relative: 0 % (ref 0–1)
Eosinophils Absolute: 0 10*3/uL (ref 0.0–0.7)
Eosinophils Absolute: 0 10*3/uL (ref 0.0–0.7)
Eosinophils Absolute: 0 10*3/uL (ref 0.0–0.7)
Eosinophils Relative: 0 % (ref 0–5)
Eosinophils Relative: 0 % (ref 0–5)
Lymphocytes Relative: 7 % — ABNORMAL LOW (ref 12–46)
Lymphs Abs: 1.5 10*3/uL (ref 0.7–4.0)
Lymphs Abs: 1.6 10*3/uL (ref 0.7–4.0)
Monocytes Absolute: 0.4 10*3/uL (ref 0.1–1.0)
Monocytes Absolute: 0.5 10*3/uL (ref 0.1–1.0)
Monocytes Absolute: 0.6 10*3/uL (ref 0.1–1.0)
Monocytes Relative: 1 % — ABNORMAL LOW (ref 3–12)
Monocytes Relative: 5 % (ref 3–12)
Neutro Abs: 6.1 10*3/uL (ref 1.7–7.7)
Neutro Abs: 7.9 10*3/uL — ABNORMAL HIGH (ref 1.7–7.7)
Neutrophils Relative %: 82 % — ABNORMAL HIGH (ref 43–77)
Neutrophils Relative %: 83 % — ABNORMAL HIGH (ref 43–77)
Neutrophils Relative %: 85 % — ABNORMAL HIGH (ref 43–77)

## 2011-01-07 LAB — BLOOD GAS, ARTERIAL
Acid-base deficit: 4.7 mmol/L — ABNORMAL HIGH (ref 0.0–2.0)
Acid-base deficit: 7.1 mmol/L — ABNORMAL HIGH (ref 0.0–2.0)
Bicarbonate: 18.9 mEq/L — ABNORMAL LOW (ref 20.0–24.0)
O2 Content: 4 L/min
O2 Content: 4 L/min
O2 Saturation: 93.7 %
Patient temperature: 37
TCO2: 14.8 mmol/L (ref 0–100)
pCO2 arterial: 24.2 mmHg — ABNORMAL LOW (ref 35.0–45.0)
pCO2 arterial: 28.9 mmHg — ABNORMAL LOW (ref 35.0–45.0)
pH, Arterial: 7.442 — ABNORMAL HIGH (ref 7.350–7.400)
pO2, Arterial: 68.7 mmHg — ABNORMAL LOW (ref 80.0–100.0)

## 2011-01-07 LAB — COMPREHENSIVE METABOLIC PANEL
ALT: 127 U/L — ABNORMAL HIGH (ref 0–35)
ALT: 86 U/L — ABNORMAL HIGH (ref 0–35)
AST: 178 U/L — ABNORMAL HIGH (ref 0–37)
Albumin: 2.5 g/dL — ABNORMAL LOW (ref 3.5–5.2)
Albumin: 2.6 g/dL — ABNORMAL LOW (ref 3.5–5.2)
Alkaline Phosphatase: 98 U/L (ref 39–117)
Alkaline Phosphatase: 98 U/L (ref 39–117)
BUN: 20 mg/dL (ref 6–23)
BUN: 34 mg/dL — ABNORMAL HIGH (ref 6–23)
CO2: 19 mEq/L (ref 19–32)
CO2: 22 mEq/L (ref 19–32)
Calcium: 8.2 mg/dL — ABNORMAL LOW (ref 8.4–10.5)
Calcium: 8.4 mg/dL (ref 8.4–10.5)
Chloride: 107 mEq/L (ref 96–112)
Creatinine, Ser: 1.14 mg/dL (ref 0.4–1.2)
Creatinine, Ser: 1.75 mg/dL — ABNORMAL HIGH (ref 0.4–1.2)
GFR calc Af Amer: >60 mL/min (ref >60)
GFR calc Af Amer: >60 mL/min (ref >60)
GFR calc non Af Amer: 31 mL/min — ABNORMAL LOW (ref >60)
GFR calc non Af Amer: >60 mL/min (ref >60)
Glucose, Bld: 117 mg/dL — ABNORMAL HIGH (ref 70–99)
Glucose, Bld: 118 mg/dL — ABNORMAL HIGH (ref 70–99)
Potassium: 3.6 mEq/L (ref 3.5–5.1)
Potassium: 4.8 mEq/L (ref 3.5–5.1)
Sodium: 130 mEq/L — ABNORMAL LOW (ref 135–145)
Sodium: 135 mEq/L (ref 135–145)
Sodium: 138 mEq/L (ref 135–145)
Total Bilirubin: 1.9 mg/dL — ABNORMAL HIGH (ref 0.3–1.2)
Total Bilirubin: 10.7 mg/dL — ABNORMAL HIGH (ref 0.3–1.2)
Total Protein: 6.7 g/dL (ref 6.0–8.3)
Total Protein: 6.8 g/dL (ref 6.0–8.3)
Total Protein: 7.6 g/dL (ref 6.0–8.3)
Total Protein: 7.7 g/dL (ref 6.0–8.3)

## 2011-01-07 LAB — BASIC METABOLIC PANEL
BUN: 11 mg/dL (ref 6–23)
BUN: 9 mg/dL (ref 6–23)
CO2: 20 mEq/L (ref 19–32)
CO2: 23 mEq/L (ref 19–32)
Chloride: 106 mEq/L (ref 96–112)
Chloride: 106 mEq/L (ref 96–112)
Creatinine, Ser: 0.54 mg/dL (ref 0.4–1.2)
Creatinine, Ser: 0.79 mg/dL (ref 0.4–1.2)
GFR calc Af Amer: >60 mL/min (ref >60)
Glucose, Bld: 146 mg/dL — ABNORMAL HIGH (ref 70–99)
Glucose, Bld: 97 mg/dL (ref 70–99)
Potassium: 3 mEq/L — ABNORMAL LOW (ref 3.5–5.1)
Potassium: 3.5 mEq/L (ref 3.5–5.1)
Potassium: 3.5 mEq/L (ref 3.5–5.1)
Sodium: 138 mEq/L (ref 135–145)

## 2011-01-07 LAB — LACTIC ACID, PLASMA: Lactic Acid, Venous: 2.9 mmol/L — ABNORMAL HIGH (ref 0.5–2.2)

## 2011-01-07 LAB — GLUCOSE, CAPILLARY
Glucose-Capillary: 100 mg/dL — ABNORMAL HIGH (ref 70–99)
Glucose-Capillary: 103 mg/dL — ABNORMAL HIGH (ref 70–99)
Glucose-Capillary: 112 mg/dL — ABNORMAL HIGH (ref 70–99)
Glucose-Capillary: 113 mg/dL — ABNORMAL HIGH (ref 70–99)
Glucose-Capillary: 117 mg/dL — ABNORMAL HIGH (ref 70–99)
Glucose-Capillary: 119 mg/dL — ABNORMAL HIGH (ref 70–99)
Glucose-Capillary: 124 mg/dL — ABNORMAL HIGH (ref 70–99)
Glucose-Capillary: 129 mg/dL — ABNORMAL HIGH (ref 70–99)
Glucose-Capillary: 134 mg/dL — ABNORMAL HIGH (ref 70–99)
Glucose-Capillary: 164 mg/dL — ABNORMAL HIGH (ref 70–99)
Glucose-Capillary: 215 mg/dL — ABNORMAL HIGH (ref 70–99)

## 2011-01-07 LAB — CULTURE, BLOOD (ROUTINE X 2)
Culture: NO GROWTH
Report Status: 1302011
Report Status: 1302011

## 2011-01-07 LAB — AFB CULTURE WITH SMEAR (NOT AT ARMC): Acid Fast Smear: NONE SEEN

## 2011-01-07 LAB — HEPATITIS PANEL, ACUTE
HCV Ab: NEGATIVE
Hepatitis B Surface Ag: NEGATIVE

## 2011-01-07 LAB — POCT CARDIAC MARKERS
CKMB, poc: 2.7 ng/mL (ref 1.0–8.0)
Myoglobin, poc: >500 ng/mL (ref 12–200)

## 2011-01-07 LAB — RAPID URINE DRUG SCREEN, HOSP PERFORMED
Benzodiazepines: POSITIVE — AB
Cocaine: NOT DETECTED
Opiates: NOT DETECTED

## 2011-01-07 LAB — CBC
HCT: 25.9 % — ABNORMAL LOW (ref 36.0–46.0)
HCT: 26.9 % — ABNORMAL LOW (ref 36.0–46.0)
HCT: 26.9 % — ABNORMAL LOW (ref 36.0–46.0)
HCT: 29.3 % — ABNORMAL LOW (ref 36.0–46.0)
HCT: 36.8 % (ref 36.0–46.0)
Hemoglobin: 12.5 g/dL (ref 12.0–15.0)
Hemoglobin: 9.2 g/dL — ABNORMAL LOW (ref 12.0–15.0)
MCHC: 33.8 g/dL (ref 30.0–36.0)
MCHC: 33.9 g/dL (ref 30.0–36.0)
MCHC: 34.1 g/dL (ref 30.0–36.0)
MCHC: 34.2 g/dL (ref 30.0–36.0)
MCV: 89.8 fL (ref 78.0–100.0)
MCV: 90.6 fL (ref 78.0–100.0)
MCV: 90.6 fL (ref 78.0–100.0)
MCV: 90.7 fL (ref 78.0–100.0)
MCV: 90.8 fL (ref 78.0–100.0)
MCV: 90.9 fL (ref 78.0–100.0)
Platelets: 133 10*3/uL — ABNORMAL LOW (ref 150–400)
Platelets: 248 10*3/uL (ref 150–400)
Platelets: 282 10*3/uL (ref 150–400)
RBC: 2.96 MIL/uL — ABNORMAL LOW (ref 3.87–5.11)
RBC: 2.97 MIL/uL — ABNORMAL LOW (ref 3.87–5.11)
RBC: 4.1 MIL/uL (ref 3.87–5.11)
RDW: 15.5 % (ref 11.5–15.5)
RDW: 15.7 % — ABNORMAL HIGH (ref 11.5–15.5)
RDW: 15.8 % — ABNORMAL HIGH (ref 11.5–15.5)
WBC: 10.4 10*3/uL (ref 4.0–10.5)
WBC: 11.5 10*3/uL — ABNORMAL HIGH (ref 4.0–10.5)
WBC: 7.1 10*3/uL (ref 4.0–10.5)

## 2011-01-07 LAB — T4, FREE: Free T4: 1.17 ng/dL (ref 0.80–1.80)

## 2011-01-07 LAB — CK
Total CK: 11219 U/L — ABNORMAL HIGH (ref 7–177)
Total CK: 17346 U/L — ABNORMAL HIGH (ref 7–177)
Total CK: 2039 U/L — ABNORMAL HIGH (ref 7–177)

## 2011-01-07 LAB — PROTIME-INR: Prothrombin Time: 12.8 seconds (ref 11.6–15.2)

## 2011-01-07 LAB — LACTATE DEHYDROGENASE
LDH: 1429 U/L — ABNORMAL HIGH (ref 94–250)
LDH: 2331 U/L — ABNORMAL HIGH (ref 94–250)
LDH: 501 U/L — ABNORMAL HIGH (ref 94–250)

## 2011-01-07 LAB — CARDIAC PANEL(CRET KIN+CKTOT+MB+TROPI)
Relative Index: 0 (ref 0.0–2.5)
Relative Index: 0 (ref 0.0–2.5)
Relative Index: 0 (ref 0.0–2.5)
Total CK: 16779 U/L — ABNORMAL HIGH (ref 7–177)
Troponin I: 0.04 ng/mL (ref 0.00–0.06)
Troponin I: 0.05 ng/mL (ref 0.00–0.06)

## 2011-01-07 LAB — T-HELPER CELLS (CD4) COUNT (NOT AT ARMC)
CD4 % Helper T Cell: 15 % — ABNORMAL LOW (ref 33–55)
CD4 T Cell Abs: 140 uL — ABNORMAL LOW (ref 400–2700)

## 2011-01-07 LAB — RETICULOCYTES: Retic Ct Pct: 0.8 % (ref 0.4–3.1)

## 2011-01-07 LAB — HAPTOGLOBIN: Haptoglobin: 475 mg/dL — ABNORMAL HIGH (ref 16–200)

## 2011-01-07 LAB — HIV-1 RNA QUANT-NO REFLEX-BLD: HIV 1 RNA Quant: 235 copies/mL — ABNORMAL HIGH (ref <48)

## 2011-01-07 LAB — HEPATIC FUNCTION PANEL
AST: 307 U/L — ABNORMAL HIGH (ref 0–37)
AST: 525 U/L — ABNORMAL HIGH (ref 0–37)
Albumin: 2.3 g/dL — ABNORMAL LOW (ref 3.5–5.2)
Albumin: 2.4 g/dL — ABNORMAL LOW (ref 3.5–5.2)
Total Protein: 7.2 g/dL (ref 6.0–8.3)
Total Protein: 7.2 g/dL (ref 6.0–8.3)

## 2011-01-07 LAB — PNEUMOCYSTIS JIROVECI SMEAR BY DFA: Pneumocystis jiroveci Ag: NOT DETECTED

## 2011-01-07 LAB — FOLATE: Folate: 9.2 ng/mL

## 2011-01-07 LAB — APTT: aPTT: 40 seconds — ABNORMAL HIGH (ref 24–37)

## 2011-01-07 LAB — MRSA PCR SCREENING: MRSA by PCR: NEGATIVE

## 2011-01-07 LAB — T-HELPER CELL (CD4) - (RCID CLINIC ONLY): CD4 T Cell Abs: 470 uL (ref 400–2700)

## 2011-01-07 LAB — CULTURE, RESPIRATORY W GRAM STAIN: Culture: NORMAL

## 2011-01-07 LAB — MAGNESIUM: Magnesium: 1.8 mg/dL (ref 1.5–2.5)

## 2011-01-07 LAB — EXPECTORATED SPUTUM ASSESSMENT W GRAM STAIN, RFLX TO RESP C

## 2011-01-07 LAB — BRAIN NATRIURETIC PEPTIDE: Pro B Natriuretic peptide (BNP): 196 pg/mL — ABNORMAL HIGH (ref 0.0–100.0)

## 2011-01-07 LAB — VITAMIN B12: Vitamin B-12: 672 pg/mL (ref 211–911)

## 2011-01-09 ENCOUNTER — Encounter (INDEPENDENT_AMBULATORY_CARE_PROVIDER_SITE_OTHER): Payer: Medicare Other | Admitting: Psychiatry

## 2011-01-09 ENCOUNTER — Other Ambulatory Visit: Payer: Self-pay | Admitting: Family Medicine

## 2011-01-09 DIAGNOSIS — F3189 Other bipolar disorder: Secondary | ICD-10-CM

## 2011-01-09 NOTE — Progress Notes (Signed)
Summary: refill  Phone Note Call from Patient   Summary of Call: pt is out of nausea med. forgot to tell doc today. [phone] Initial call taken by: Rudene Anda,  January 03, 2011 4:33 PM  Follow-up for Phone Call        may i refill promethazine Follow-up by: Adella Hare LPN,  January 03, 2011 4:38 PM  Additional Follow-up for Phone Call Additional follow up Details #1::        refill x 2 pls Additional Follow-up by: [name],  January 03, 2011 4:46 PM    Prescriptions: PROMETHAZINE HCL 25 MG TABS (PROMETHAZINE HCL) Take 1 tablet by mouth two times a day as needed  #20 Each x 1   Entered by:   Adella Hare LPN   Authorized by:   [name]   Signed by:   Adella Hare LPN on 62/13/0865   Method used:   Electronically to        [facility] (retail)       [address] [place], [place]  [address]       Ph: [phone]       Fax: [phone]   RxID:   [id number]

## 2011-01-09 NOTE — Progress Notes (Signed)
Summary: sick  Phone Note Call from Patient   Summary of Call: throat is hurting and no fever. wants to be seen. hot and cold  no appts. [phone] Initial call taken by: Rudene Anda,  January 01, 2011 4:06 PM  Follow-up for Phone Call        please work in on am schedule Follow-up by: Adella Hare LPN,  January 01, 2011 4:13 PM  Additional Follow-up for Phone Call Additional follow up Details #1::        pt was scheduled appt for dr. Lodema Hong for 01/03/2011 1:00. pt aware Additional Follow-up by: Rudene Anda,  January 02, 2011 9:31 AM

## 2011-01-10 ENCOUNTER — Telehealth: Payer: Self-pay | Admitting: Family Medicine

## 2011-01-10 LAB — COMPREHENSIVE METABOLIC PANEL
Alkaline Phosphatase: 88 U/L (ref 39–117)
BUN: 9 mg/dL (ref 6–23)
Creatinine, Ser: 0.59 mg/dL (ref 0.4–1.2)
Glucose, Bld: 110 mg/dL — ABNORMAL HIGH (ref 70–99)
Potassium: 3.6 mEq/L (ref 3.5–5.1)
Total Protein: 6.8 g/dL (ref 6.0–8.3)

## 2011-01-10 LAB — CBC
HCT: 26.3 % — ABNORMAL LOW (ref 36.0–46.0)
MCHC: 32.9 g/dL (ref 30.0–36.0)
MCV: 93.3 fL (ref 78.0–100.0)
Platelets: 268 10*3/uL (ref 150–400)
RDW: 16.5 % — ABNORMAL HIGH (ref 11.5–15.5)
WBC: 9.2 10*3/uL (ref 4.0–10.5)

## 2011-01-10 LAB — DIFFERENTIAL
Basophils Absolute: 0 10*3/uL (ref 0.0–0.1)
Basophils Relative: 0 % (ref 0–1)
Monocytes Relative: 8 % (ref 3–12)
Neutro Abs: 7.1 10*3/uL (ref 1.7–7.7)
Neutrophils Relative %: 78 % — ABNORMAL HIGH (ref 43–77)

## 2011-01-10 LAB — T-HELPER CELL (CD4) - (RCID CLINIC ONLY): CD4 T Cell Abs: 630 uL (ref 400–2700)

## 2011-01-10 LAB — LACTATE DEHYDROGENASE: LDH: 245 U/L (ref 94–250)

## 2011-01-10 NOTE — Telephone Encounter (Signed)
Unless she has a positive rapid strep culture, no need for more antibiotics, she has been adequately treated, advise salt water gargles and voice rest, okm to offer nurse visit for rapid strep test if she really wants this checked, I do not believe she needs it

## 2011-01-11 ENCOUNTER — Telehealth: Payer: Self-pay

## 2011-01-11 MED ORDER — MAGIC MOUTHWASH: 10.0000 mL | Freq: Three times a day (TID) | ORAL | Status: DC

## 2011-01-11 NOTE — Progress Notes (Signed)
Patient aware that she is not diabetic

## 2011-01-11 NOTE — Telephone Encounter (Signed)
Patient is aware 

## 2011-01-11 NOTE — Telephone Encounter (Signed)
Patient will call back if she thinks she needs the test done

## 2011-01-11 NOTE — Telephone Encounter (Signed)
Med called in to [facility] 

## 2011-01-11 NOTE — Telephone Encounter (Signed)
pls advise and erx dukes magic mouthwash 10cc 3 times daily gargle , swish and swallow for 10 days (300 cc)

## 2011-01-18 NOTE — Assessment & Plan Note (Signed)
Summary: office visit per jamie   Vital Signs:  Patient profile:   50 year old female Menstrual status:  hysterectomy Height:      64 inches O2 Sat:      94 % Pulse rate:   106 / minute Pulse rhythm:   regular Resp:     16 per minute BP sitting:   130 / 100  (right arm)  Vitals Entered By: Everitt Amber LPN (January 03, 2011 1:03 PM) CC: Had surgery last thursday and her throat has been sore every since, irritated feeling from the tube down her throat   Primary Care Provider:  [name]  CC:  Had surgery last thursday and her throat has been sore every since and irritated feeling from the tube down her throat.  History of Present Illness: Reports  that she has not been feeling well, and has been experiencing sore throat ever since she had a procedure last week.   Denies recent fever or chills. Denies sinus pressure, nasal congestion  or ear pain  reports  chest congestion, and denies cough productive of sputum. Denies chest pain, palpitations, PND, orthopnea or leg swelling. Denies abdominal pain, nausea, vomitting, diarrhea obeen doing  wellr constipation. Denies change in bowel movements or bloody stool. Denies dysuria , frequency, incontinence or hesitancy.  Denies headaches, vertigo, seizures. Denies uncontrolled  depression, anxiety or insomnia. Denies  rash, lesions, or itch.     Preventive Screening-Counseling & Management  Alcohol-Tobacco     Smoking Cessation Counseling: yes  Current Medications (verified): 1)  Hydroxyzine Hcl 25 Mg Tabs (Hydroxyzine Hcl) .... As Needed 2)  Nexium 40 Mg Cpdr (Esomeprazole Magnesium) .... Take 1 Capsule By Mouth Once A Day 3)  Trizivir 300-150-300 Mg Tabs (Abacavir-Lamivudine-Zidovudine) .... Take 1 Tablet By Mouth Twice A Day 4)  Verapamil Hcl Cr 240 Mg Tbcr (Verapamil Hcl) .... Take 1 Tablet By Mouth Once A Day 5)  Qvar 40 Mcg/act Aers (Beclomethasone Dipropionate) .... 2 Puffs Two Times A Day 6)  Xanax 1 Mg  Tabs  (Alprazolam) .... Take 1 Tablet By Mouth Two Times A Day As Needed 7)  Promethazine Hcl 25 Mg Tabs (Promethazine Hcl) .... Take 1 Tablet By Mouth Two Times A Day As Needed 8)  Ventolin Hfa 108 (90 Base) Mcg/act Aers (Albuterol Sulfate) .... 2 Puffs Every 4-6 Hrs As Needed 9)  Diphenoxylate-Atropine 2.5-0.025 Mg Tabs (Diphenoxylate-Atropine) .... One Tab By Mouth Once Daily Prn 10)  Norvir 100 Mg Tabs (Ritonavir) .... Take 1 Tablet By Mouth Once A Day 11)  Prezista 400 Mg Tabs (Darunavir Ethanolate) .... 2 Tab Once Daily 12)  Vicodin Es 7.5-750 Mg Tabs (Hydrocodone-Acetaminophen) .... Take 1 Tablet By Mouth Two Times A Day As Needed For Uncontrolled Pain 13)  Cyclobenzaprine Hcl 10 Mg Tabs (Cyclobenzaprine Hcl) .... One Tab Every 8 Hrs As Needed 14)  Depakote Er 250 Mg Xr24h-Tab (Divalproex Sodium) .... 2 Tabs At Bedtime 15)  Cetirizine Hcl 10 Mg Tabs (Cetirizine Hcl) .... Take 1 Tablet By Mouth Once A Day  Allergies (verified): 1)  ! Sulfa 2)  ! Pcn  Past History:  Past medical, surgical, family and social histories (including risk factors) reviewed, and no changes noted (except as noted below).  Past Medical History: Reviewed history from 11/29/2009 and no changes required. Baker's cyst Pulmonary lesion on RUL on CT 1/03 Hemorrhoids Zoster 2001 Asthma GERD HIV disease     M184V 04-13-09 Hypertension ? PCP 11-2009     FAMILY HISTORY DIABETES  1ST DEGREE RELATIVE (ICD-V18.0) TOBACCO USER (ICD-305.1)  ABNORMAL PAP SMEAR, LGSIL (ICD-795.09) HERPES ZOSTER (ICD-053.9) HEMORRHOIDS (ICD-455.6) BAKER'S CYST (ICD-727.51) PANCREATITIS, HX OF (ICD-V12.70) HYPERTENSION (ICD-401.9) GERD (ICD-530.81) ASTHMA (ICD-493.90)  Past Surgical History: Right breast lumpectomy TAH and BSO Cholecystectomy  Left knee arthroscopy Mar 2012  Family History: Reviewed history from 06/22/2008 and no changes required. Family History Diabetes 1st degree relative Family History Hypertension MOM   DECEASED  72  HTM, DM DAD DECEASED  70  DM SISTERS 1 living, 2 deceased cancer liver Brother x1 deceased in his 103's diabetes  Social History: Reviewed history from 08/02/2010 and no changes required. Current Smoker- 1ppd, >25 years Alcohol use-yes Drug use-yes, marijuana SEPERATED No children  Review of Systems      See HPI General:  Complains of fatigue. Eyes:  Denies discharge and red eye. ENT:  Complains of sore throat. MS:  Complains of joint pain, low back pain, mid back pain, and stiffness. Psych:  Complains of anxiety, depression, and mental problems; denies suicidal thoughts/plans, thoughts of violence, and unusual visions or sounds. Endo:  Denies cold intolerance, excessive hunger, excessive thirst, and heat intolerance. Heme:  Denies abnormal bruising, bleeding, enlarge lymph nodes, and fevers. Allergy:  Denies hives or rash, itching eyes, persistent infections, and seasonal allergies.  Physical Exam  General:  Well-developed,well-nourished,in no acute distress; alert,appropriate and cooperative throughout examination HEENT: No facial asymmetry,  EOMI, No sinus tenderness, TM's Clear, oropharynx  erythematous, no exudate and moist. reduced ROM neck  Chest: Clear to auscultation bilaterally. decreased throughout CVS: S1, S2, No murmurs, No S3.   Abd: Soft, Nontender.  MS: decreased  ROM spine, and ankles,adequate in  hips, shoulders and knees.  Ext: No edema.   CNS: CN 2-12 intact, power tone and sensation normal throughout.   Skin: Intact, no visible lesions or rashes.  Psych: Good eye contact, normal affect.  Memory intact, not anxious or depressed appearing.]    Impression & Recommendations:  Problem # 1:  ACUTE PHARYNGITIS (ICD-462) Assessment Comment Only  The following medications were removed from the medication list:    Meloxicam 15 Mg Tabs (Meloxicam) .Marland Kitchen... Take 1 tablet by mouth once a day as needed for uncontrolled pain  Orders: Medicare  Electronic Prescription [phone])  Problem # 2:  IMPAIRED GLUCOSE TOLERANCE (ICD-271.3) Assessment: Comment Only Pt advised to reduce carbohydrate intake, espescially sweets, and to start regular physical activity, at least 30 minutes 5 days weekly, to enable weight loss, and reduce the risk of becoming diabetic   Problem # 3:  DEPRESSION (ICD-311) Assessment: Improved  Her updated medication list for this problem includes:    Hydroxyzine Hcl 25 Mg Tabs (Hydroxyzine hcl) .Marland Kitchen... As needed    Xanax 1 Mg Tabs (Alprazolam) .Marland Kitchen... Take 1 tablet by mouth two times a day as needed treated by psychiATRY  Problem # 4:  TOBACCO USER (ICD-305.1) Assessment: Unchanged  Encouraged smoking cessation and discussed different methods for smoking cessation.   Problem # 5:  HYPERTENSION (ICD-401.9) Assessment: Improved  The following medications were removed from the medication list:    Clonidine Hcl 0.1 Mg Tabs (Clonidine hcl) .Marland Kitchen... Take 1 tab by mouth at bedtime Her updated medication list for this problem includes:    Verapamil Hcl Cr 240 Mg Tbcr (Verapamil hcl) .Marland Kitchen... Take 1 tablet by mouth once a day    Clonidine Hcl 0.1 Mg Tabs (Clonidine hcl) .Marland Kitchen... Take 1 tab by mouth at bedtime  BP today: 130/100 Prior BP: 146/105 (11/15/2010)  Labs  Reviewed: K+: 4.3 (11/15/2010) Creat: : 0.72 (11/15/2010)   Chol: 174 (11/15/2010)   HDL: 50 (11/15/2010)   LDL: 97 (11/15/2010)   TG: 136 (11/15/2010)  Complete Medication List: 1)  Hydroxyzine Hcl 25 Mg Tabs (Hydroxyzine hcl) .... As needed 2)  Nexium 40 Mg Cpdr (Esomeprazole magnesium) .... Take 1 capsule by mouth once a day 3)  Trizivir 300-150-300 Mg Tabs (Abacavir-lamivudine-zidovudine) .... Take 1 tablet by mouth twice a day 4)  Verapamil Hcl Cr 240 Mg Tbcr (Verapamil hcl) .... Take 1 tablet by mouth once a day 5)  Qvar 40 Mcg/act Aers (Beclomethasone dipropionate) .... 2 puffs two times a day 6)  Xanax 1 Mg Tabs (Alprazolam) .... Take 1 tablet by mouth  two times a day as needed 7)  Promethazine Hcl 25 Mg Tabs (Promethazine hcl) .... Take 1 tablet by mouth two times a day as needed 8)  Ventolin Hfa 108 (90 Base) Mcg/act Aers (Albuterol sulfate) .... 2 puffs every 4-6 hrs as needed 9)  Diphenoxylate-atropine 2.5-0.025 Mg Tabs (Diphenoxylate-atropine) .... One tab by mouth once daily prn 10)  Norvir 100 Mg Tabs (Ritonavir) .... Take 1 tablet by mouth once a day 11)  Prezista 400 Mg Tabs (Darunavir ethanolate) .... 2 tab once daily 12)  Vicodin Es 7.5-750 Mg Tabs (Hydrocodone-acetaminophen) .... Take 1 tablet by mouth two times a day as needed for uncontrolled pain 13)  Cyclobenzaprine Hcl 10 Mg Tabs (Cyclobenzaprine hcl) .... One tab every 8 hrs as needed 14)  Depakote Er 250 Mg Xr24h-tab (Divalproex sodium) .... 2 tabs at bedtime 15)  Cetirizine Hcl 10 Mg Tabs (Cetirizine hcl) .... Take 1 tablet by mouth once a day 16)  Fluconazole 150 Mg Tabs (Fluconazole) .... Take 1 tablet by mouth once a day 17)  Clonidine Hcl 0.1 Mg Tabs (Clonidine hcl) .... Take 1 tab by mouth at bedtime  Other Orders: T- Hemoglobin A1C ([id number])  Patient Instructions: 1)  Please schedule a follow-up appointment in 3 months. 2)  Tobacco is very bad for your health and your loved ones! You Should stop smoking!. 3)  Stop Smoking Tips: Choose a Quit date. Cut down before the Quit date. decide what you will do as a substitute when you feel the urge to smoke(gum,toothpick,exercise). 4)  It is important that you exercise regularly at least 20 minutes 5 times a week. If you develop chest pain, have severe difficulty breathing, or feel very tired , stop exercising immediately and seek medical attention. 5)  You need to lose weight. Consider a lower calorie diet and regular exercise.  6)  HbgA1C prior to visit, ICD-9:   today pls  impaired glucose tolerance 7)  med iss sent in for pharyngitis and adenitis.salt water gagrgles 3 times daily, do nt talk, and honey and lime 8)   all the best with you upcoming surgery Prescriptions: CLONIDINE HCL 0.1 MG TABS (CLONIDINE HCL) Take 1 tab by mouth at bedtime  #90 x 1   Entered and Authorized by:   [name]   Signed by:   [name] on 01/03/2011   Method used:   Historical   RxID:   [id number] FLUCONAZOLE 150 MG TABS (FLUCONAZOLE) Take 1 tablet by mouth once a day  #3 x 0   Entered and Authorized by:   [name]   Signed by:   [name] on 01/03/2011   Method used:   Electronically to        [facility] (retail)  [address]       [place], [place]  [address]       Ph: [phone]       Fax: [phone]   RxID:   [id number] AZITHROMYCIN 250 MG TABS (AZITHROMYCIN) two tablets on day 1 , and one tablet daily for an additional 4 days  #6 x 0   Entered and Authorized by:   [name]   Signed by:   [name] on 01/03/2011   Method used:   Electronically to        [facility] (retail)       [address]       [place], [place]  [address]       Ph: [phone]       Fax: [phone]   RxID:   [id number]    Orders Added: 1)  Est. Patient Level IV [28315] 2)  Medicare Electronic Prescription [G8553] 3)  T- Hemoglobin A1C [[id number]]

## 2011-01-25 ENCOUNTER — Ambulatory Visit (HOSPITAL_COMMUNITY): Payer: Medicare Other | Admitting: Physical Therapy

## 2011-01-30 ENCOUNTER — Encounter (HOSPITAL_COMMUNITY): Payer: Medicare Other | Admitting: Psychiatry

## 2011-02-22 ENCOUNTER — Encounter (HOSPITAL_COMMUNITY): Payer: Medicare Other | Admitting: Psychology

## 2011-02-27 ENCOUNTER — Encounter (INDEPENDENT_AMBULATORY_CARE_PROVIDER_SITE_OTHER): Payer: Medicare Other | Admitting: Psychiatry

## 2011-02-27 DIAGNOSIS — F3189 Other bipolar disorder: Secondary | ICD-10-CM

## 2011-03-06 NOTE — H&P (Signed)
[name], TRAUGER             ACCOUNT NO.:  [id number]   MEDICAL RECORD NO.:  [id number]          PATIENT TYPE:  OBV   LOCATION:  A202                          FACILITY:  [facility]   PHYSICIAN:  Skeet Latch, DO    DATE OF BIRTH:  1960-10-25   DATE OF ADMISSION:  06/27/2007  DATE OF DISCHARGE:  LH                              HISTORY & PHYSICAL   CHIEF COMPLAINT:  Abdominal pain.   HISTORY OF PRESENT ILLNESS:  This is a 50 year old African-American  female who presents with a 1-day history of abdominal pain with  radiation to her left flank.  The patient stated that yesterday she  began to have an acute onset of acute abdominal pain that seemed to have  been radiating to her left flank.  She states the pain is persistent and  sharp in nature; and has no relief.  She admits to the pain being a  10/10 at its worst.  She denies any nausea or vomiting at this time.  The patient does have a history of pancreatitis and HIV also.  The  patient was admitted back in July of 2008 for chest pain and left 2 days  later against medical advice.  The patient is being seen by infectious  disease Dr. Ninetta Lights regarding her HIV.   PAST MEDICAL HISTORY INCLUDES:  1. HIV.  2. Asthma.  3. Hypertension.  4. Gastroesophageal reflux disease.  5. Bronchitis.   ALLERGIES:  SULFA.   PAST SURGICAL HISTORY:  1. Hysterectomy.  2. Cholecystectomy.  3. Benign breast tumor removal.   SOCIAL HISTORY:  She lives in [place]. She has a 20-pack-year history  of smoking.  The patient does drink alcohol regularly, admits to  marijuana, and admits to recent cocaine abuse.   FAMILY HISTORY:  The mother has a pacemaker and is for positive heart  disease.   HOME MEDICATIONS:  1. Phenergan 25 mg q.8 h. as needed.  2. Tekturna 150 mg daily.  3. Ambien 10 mg q.h.s.  4. Verapamil daily, unknown dose.  5. Nexium daily.  6. Albuterol daily.  7. Combivent daily.  8. Combivir twice a day.  9. I believe  another HIV medication -- unknown at this time.   REVIEW OF SYSTEMS:  GASTROINTESTINAL:  Abdominal pain.  No nausea,  vomiting.  CARDIOVASCULAR: No chest pain.  RESPIRATORY:  No shortness,  dyspnea, or wheezing.  HEENT:  No headaches.  EYES, EARS, NOSE, AND  THROAT:  No problems at this time.  No other systems are positive at  this time.   PHYSICAL EXAM:  GENERAL:  The patient is awake and alert.  No acute  distress, well-developed, well-hydrated.  VITAL SIGNS:  For for flu-type temperature 97.6, respirations 18, pulse  111, blood pressure 152/114.  HEENT:  Head is atraumatic, normocephalic.  No scleral icterus.  Oral  mucosa is moist.  NECK:  Soft, supple, nontender, nondistended.  CARDIOVASCULAR:  She is tachycardiac.  No murmurs, rubs, or gallops  appreciated.  LUNGS:  Clear to auscultation bilaterally.  No rales,  rhonchi, or wheezing.  ABDOMEN:  She has lower left quadrant  pain on palpation with some  radiation to her left flank and back area.  No rigidity, rebound, or  guarding.  No masses present.  Positive bowel sounds.  EXTREMITIES:  No clubbing, cyanosis, or edema.   RADIOLOGIC STUDIES:  Abdominal series -- no acute abdominal findings,  some chronic calcified pancreatic findings.  EKG -- shows sinus  tachycardia with some left atrial hypertrophy.  Labs -- White count 3.9,  hemoglobin 10.6, hematocrit 31.6, platelets 219.  Urinalysis trace of  blood, trace protein.  Urine drug screen was positive for cocaine and  marijuana.  Alcohol level was 14.  Sodium 141, potassium 4.2, chloride  105, CO2 of 24, glucose 99, BUN 1, creatinine was 0.64.  Amylase 106,  lipase 136, total creatinine kinase was 129.  CK-MB was 1.0 last  troponin 0.02.   ASSESSMENT:  1. Abdominal pain.  2. Acute-on-chronic pancreatitis.  3. History of polysubstance abuse with cocaine and alcohol.  4. Uncontrolled hypertension.  5. Tachycardia.   PLAN:  1. For abdominal pain, the patient be kept n.p.o.  with IV fluids and      we will get a GI consult.  2. For acute-on-chronic pancreatitis, as mentioned above, the patient      will be kept n.p.o. and we will await GI recommendations.  3. For history polysubstance of cocaine and alcohol, the patient was      placed on Ativan withdrawal protocol.  She will receive counseling      regarding his substance abuse.  4. For her uncontrolled hypertension, the patient is on the verapamil      and Tekturna.  We will give clonidine p.r.n.  5. For her tachycardia, I believe this is probably secondary to her      abdominal pain as well as her polysubstance      abuse.  6. The patient is placed in observation.  I will await GI      recommendations if no aggressive therapy, the patient will prior be      discharged within the next 24-48 hours.      Skeet Latch, DO  Electronically Signed     SM/MEDQ  D:  06/27/2007  T:  06/27/2007  Job:  [id number]   cc:   Milus Mallick. Lodema Hong, M.D.  Fax: [phone]

## 2011-03-06 NOTE — Discharge Summary (Signed)
NAMECAROLYNNE, SCHUCHARD             ACCOUNT NO.:  [id number]   MEDICAL RECORD NO.:  [id number]          PATIENT TYPE:  INP   LOCATION:  A211                          FACILITY:  [facility]   PHYSICIAN:  Marcello Moores, MD   DATE OF BIRTH:  Sep 19, 1961   DATE OF ADMISSION:  05/01/2007  DATE OF DISCHARGE:  07/12/2008LH                               DISCHARGE SUMMARY   Discharged against medical advice. Ms. Dunford is 50 year old female  patient who presented with complaints of chest discomfort and cough with  pain during breathing. Chest x-ray was done during the admission which  showed new right lower lobe opacity most likely pneumonia and her white  blood cell count was 16.8 which was decreased to 12.3 the day she left  and the patient also reported that she has HIV and stated she had follow-  up in [place] and the patient was put on Zithromax and Rocephin IV.  After receiving two dose of Zithromax and Rocephin, she left against  medical advise the second day. The patient was strongly advised about  the infection especially in the background of HIV, but the patient  insisted that she has things to do and she stated she is sure she will  be okay and she left against medical advice without any medication.      Marcello Moores, MD  Electronically Signed     MT/MEDQ  D:  05/04/2007  T:  05/05/2007  Job:  [id number]

## 2011-03-06 NOTE — Discharge Summary (Signed)
[name], [name]             ACCOUNT NO.:  [id number]   MEDICAL RECORD NO.:  [id number]          PATIENT TYPE:  INP   LOCATION:  A202                          FACILITY:  [facility]   PHYSICIAN:  Dorris Singh, DO    DATE OF BIRTH:  [birth year]   DATE OF ADMISSION:  06/27/2007  DATE OF DISCHARGE:  09/09/2008LH                               DISCHARGE SUMMARY   ADMISSION DIAGNOSES:  1. Abdominal pain.  2. Acute-on-chronic pancreatitis.  3. History of polysubstance abuse with cocaine and alcohol.  4. Uncontrolled hypertension.  5. Tachycardia.   DISCHARGE DIAGNOSES:  1. Acute-on-chronic pancreatitis.  2. Macrocytic anemia.  3. Polysubstance abuse.  4. Hypertension.   SERVICE:  She was admitted to the service of [facility].   PRIMARY CARE PHYSICIAN:  Milus Mallick. Lodema Hong, M.D.   CONSULTS MADE:  Jonathon Bellows, M.D. of gastroenterology.   TESTS DONE AT THIS ADMISSION INCLUDE:  Acute abdomen on September 5,  that demonstrated no acute cardiopulmonary disease, no acute findings in  the abdomen with chronic calcific pancreatitis.  A CT pelvis without  contrast that demonstrated no acute findings in the pelvis.   HOSPITAL COURSE:  Please refer to her H&P.  While she was in the  hospital she was seen by gastroenterology.  Also, the patient needed to  be transfused.  Her white count decreased from admission white count to  a low of 7.8.  At that point, the patient was transfused 2.4 units of  blood; and her white count came up to 10.4.  Since September 7 she has  remained stable at 10.4. On September 8 the patient's blood remained  stable.  Her LFTs, amylase, and lipase continued to decrease; although  her lipase remained elevated which is probably related to her alcohol  abuse; and it was determined that as long as her blood levels remained  stable we could discharge her in one day.   On September 8, the patient's H&H was 10.5/30.6.  GI recommended that  she go home on some  pancreatic enzymes; and from our standpoint we would  then recommend that the patient take folic acid, B12, and iron pills to  help raise her blood count.  From that point in time, September 9, it  was determined that the patient was stable enough to be discharged.  We  will discharge her on her home medications that she was on prior to  admission which include   DISCHARGE MEDICATIONS:  1. Phenergan 25 mg if needed.  2. Tekturna 150 mg daily.  3. Ambien 10 mg at bedtime.  4. Verapamil 240 mg daily.  5. Nexium 40 mg daily.  6. Albuterol 2 puffs, inhaled as needed.  7. Combivent inhalation as needed.  8. Kaletra 200/50 twice a day.  9. Trizivir 1 tablet twice a day.  10.She will also go home on Protonix one p.o. daily if she does not      want to continue with the Nexium.  11.KCl 20 mg one p.o. daily.  12.__________  3 caps p.o. t.i.d. with meals.  Recommend the patient to also get over-the-counter B12  as well as folic  acid and iron tablets.   CONDITION:  She was discharged in good in stable condition.   DISCHARGE INSTRUCTIONS:  She is discharged with instructions to follow  up with Dr. Lodema Hong in 1 to 3 days.      Dorris Singh, DO  Electronically Signed     CB/MEDQ  D:  07/01/2007  T:  07/01/2007  Job:  [id number]

## 2011-03-06 NOTE — Consult Note (Signed)
NAMEWYNONA, [name]             ACCOUNT NO.:  [id number]   MEDICAL RECORD NO.:  [id number]          PATIENT TYPE:  OBV   LOCATION:  A202                          FACILITY:  [facility]   PHYSICIAN:  R. Roetta Sessions, M.D. DATE OF BIRTH:  07/04/1961   DATE OF CONSULTATION:  06/27/2007  DATE OF DISCHARGE:                                 CONSULTATION   REASON FOR CONSULTATION:  Pancreatitis, polysubstance abuse.   HISTORY OF PRESENT ILLNESS:  [name] is a 50 year old  African-American female with HIV infection, admitted to the hospital  yesterday with 10/10 abdominal pain.  She came to the emergency  department with a one day history of abdominal pain.  This is described  as periumbilical, radiating towards her left flank.  At times, she tells  me it was 10/10 in severity.  She has had waxing and waning of much  milder pain over time and tells me a few years back she was admitted  here with pancreatitis.   She readily admits to ongoing use of marijuana, cocaine, and alcohol.  She had a CT scan to evaluate her kidneys two years ago, and at that  time, she had changes of chronic pancreatitis.   This admission she has a mildly elevated lipase of 136, normal amylase  at 106.  I did not see where she has had any liver enzymes at this time  drawn.  Tox screen positive for marijuana and cocaine, and she has an  alcohol level of 14 as well.   CT of the abdomen and pelvis without contrast on September 5th was done.  I reviewed it with Dr. Tyron Russell.  She does have a small peripheral right  lower lung nodule, for which followup has been recommended, a fatty  liver, and multiple calcifications of the pancreas.  She does not have  any evidence of necrotizing pancreatitis or pseudocyst.  The gallbladder  is gone.  The biliary tree is not dilated.   There is no family history of pancreatitis.   [name] denies fever, chills, clay-colored stools, dark-colored  urine, scleral  icterus.   PAST MEDICAL HISTORY:  1. HIV, followed by Dr. Ninetta Lights of the ID clinic in [place].  2. Asthma.  3. Hypertension.  4. Gastroesophageal reflux disease.  5. Bronchitis.   PAST SURGICAL HISTORY:  1. Hysterectomy.  2. Cholecystectomy.  3. Benign breast tumor, removed.  4. Bronchoscopy here by Dr. Juanetta Gosling a few years ago.   Outpatient regimen includes Phenergan, Tekturna, Ambien, verapamil,  Nexium, albuterol, Combivent, Combivir, possibly other anti-HIV meds.   ALLERGIES:  SULFA, CONTRAST DYE, reaction unknown.   FAMILY HISTORY:  Negative for chronic GI or liver disease.  Mother has a  pacemaker.   SOCIAL HISTORY:  Patient lives in [place].  She drinks alcohol  regularly.  She also smokes cigarettes and uses illicit drugs, as  outlined above.   REVIEW OF SYSTEMS:  Currently no chest pain, no dyspnea.  No fever or  chills.  She denies recent weight change.   PHYSICAL EXAMINATION:  A somewhat disheveled 50 year old lady resting  comfortably.  Temp 97.6,  pulse 93, respiratory rate 18, systolic BP 96, diastolic 68.  Physical exam reveals a somewhat disheveled 50 year old lady.  SKIN:  Warm and dry.  HEENT:  No scleral icterus.  Conjunctivae are pink.  CHEST:  Lungs are clear to auscultation.  HEART:  Regular rate and rhythm without murmur, gallop or rub.  BREASTS:  Deferred.  ABDOMEN:  Nondistended.  Bowel sounds are somewhat diminished.  She does  have epigastric tenderness to palpation.  No appreciable mass or  organomegaly.  No guarding or rebound.  There is no CVA tenderness.  EXTREMITIES:  No edema.   ADDITIONAL LABORATORY DATA:  Sodium 141, potassium 4.2, chloride 105,  CO2 24, glucose 99, BUN 1, creatinine 0.64, calcium 9.1.  CBC from  yesterday demonstrates a white count of 3.9, H&H 10.6 and 31.6, MCV  102.7, platelet count 219,000.  From July of this year, her LFTs were  normal.  Total bilirubin of 0.6, alkaline phosphatase 80, AST 28, ALT  14,  albumin 2.5.   IMPRESSION:  [name] is a 50 year old appropriate for age  with human immunodeficiency virus admitted to the hospital with acute-on-  chronic abdominal pain.  She has evidence of chronic pancreatitis on  current CT as well as the one performed in 2006.  There does not appear  to be a complicating factor such as a pseudocyst or necrotizing  component.  I suspect almost certainly her pancreatitis is related to  ongoing alcohol use and abuse.   I doubt this is biliary or has anything to do with a medication she is  taking.   At this time, her pancreatitis appears to be rather mild and  uncomplicated.   RECOMMENDATIONS:  1. Agree with current management, including n.p.o., IV fluid support,      and symptomatic treatment of symptoms related to pancreatitis.  2. As she improves, would advance diet beginning with clear liquids      and would add pancreatic enzymes with concomitant acid suppression      therapy with anticipation that she would stay on these supplements      indefinitely.  3. Would strongly recommend alcohol abstinence, although at this point      it might be the cascade of events leading to the evolution of      pancreatitis may not make much difference in the future flares, it      would probably would help.   If she has recurrent acute episode, would also consider obtaining an  endoscopic ultrasound to look for pancreatic stricture and/or stones  (something amenable to endoscopic therapy).  Overall, her prognosis is  guarded.   I would like to thank the [facility] team for allowing me to see this  lady this afternoon.      Jonathon Bellows, M.D.  Electronically Signed     RMR/MEDQ  D:  06/27/2007  T:  06/27/2007  Job:  [id number]   cc:   Lacretia Leigh. Ninetta Lights, M.D.  Fax: [phone]   [facility]

## 2011-03-06 NOTE — H&P (Signed)
[name], [name]             ACCOUNT NO.:  [id number]   MEDICAL RECORD NO.:  [id number]          PATIENT TYPE:  INP   LOCATION:  A211                          FACILITY:  [facility]   PHYSICIAN:  Skeet Latch, DO    DATE OF BIRTH:  Oct 23, 1960   DATE OF ADMISSION:  05/02/2007  DATE OF DISCHARGE:  LH                              HISTORY & PHYSICAL   PRIMARY CARE PHYSICIAN:  Milus Mallick. Lodema Hong, M.D.   INFECTIOUS DISEASE:  Lacretia Leigh. Ninetta Lights, M.D.   CHIEF COMPLAINT:  Chest pain.   HISTORY OF PRESENT ILLNESS:  This is a 50 year old, African-American  female who complains of a 2-day history of chest discomfort. The patient  states that the pain has been gradual and constant and is located in her  mid epigastric region with some radiation to the left side of her chest.  The patient states the pain is sharp, pleuritic in nature, and has  described it as severe. She also complains of some coughing and pain on  deep breathing. The patient also admits to some chills, dyspnea, fever  and nausea. According to EMS, it was thought that cocaine may be in her  house; however, the patient denied any drug or alcohol use. The patient  does have a history of cocaine abuse in the past as well as alcohol  abuse but upon questioning this has been denied.   PAST MEDICAL HISTORY:  Positive for HIV, asthma, hypertension, GERD,  bronchitis.   ALLERGIES:  SULFA DRUGS.   PAST SURGICAL HISTORY:  Hysterectomy, cholecystectomy, benign breast  tumor.   SOCIAL HISTORY:  She lives in [place]. She has a 20 pack year history  of smoking. She states she only drinks alcohol on the weekends. Admits  to marijuana use. Denies any recent cocaine use.   FAMILY HISTORY:  Mother has pacemaker and there is heart disease.   HOME MEDICATIONS:  Albuterol as needed.   REVIEW OF SYSTEMS:  The patient does not admit to any significant  complaints other than chest pain. The patient not very cooperative  regarding  answering questions.   PHYSICAL EXAMINATION:  VITAL SIGNS:  Temperature is 101.9, respirations  20, pulse 130, blood pressure is 112/69. The patient is satting 98%.  GENERAL:  She is well-developed, well-nourished. Alert and oriented x3,  no acute distress. Slightly anxious and complaining of chest discomfort  and wanting pain medication.  HEENT:  Head is atraumatic, normocephalic. No scleral icterus. Oral  membranes are moist.  NECK:  Soft, supple, nontender, nondistended.  CARDIOVASCULAR:  Tachycardic, S1, S2 is regular. No murmurs appreciated.  LUNGS:  Clear to auscultation bilaterally. Some decreased breath sounds  on the right greater than the left.  ABDOMEN:  Some epigastric tenderness on palpation. No rigidity, rebound  or guarding. No masses present. Positive bowel sounds.  EXTREMITIES:  No clubbing, cyanosis or edema.   Chest x-ray shows new right lower lobe opacity likely pneumonia.   LABORATORY DATA:  Urine 21-50 white blood cells, many bacteria.  Urinalysis moderate leukocytes. Troponin less than 0.05, CK-MB is less  than 1. Sodium 126, potassium 3.5, chloride  94, CO2 27, glucose 108, BUN  2. Creatinine 0.89. White count 13.7, hemoglobin 12.5, hematocrit 36.9,  platelets of 190.   ASSESSMENT:  This is a 49 year old female who presents with a 2-day  history of chest discomfort. The patient per chart has had multiple  visits to the emergency room for similar complaints. This does not  appear to be cardiac in nature due to negative enzymes as well as a  normal EKG. The patient does have a history of chest pain in the past  with tachycardia that was suspected to be related to her cocaine use.   PLAN:  1. The patient will be admitted for pneumonia. The patient will be      placed on IV antibiotics. Will obtain a sputum Gram's stain and      C&S. The patient will be maintained on O2 to keep her sats above      92%. Blood cultures have been obtained and antibiotics will be       adjusted pending blood cultures.  2. Tachycardia. This could be related to her pneumonia. Will just      follow patient carefully and IV hydrate.  3. Urinary tract infection. I feel coverage of IV antibiotics for      pneumonia should cover any bacteria for her urinary tract      infection. Will await urine cultures and adjust medications as      needed.  4. History of polysubstance abuse. Will get a substance abuse      counseling referral regarding this.      Skeet Latch, DO  Electronically Signed     SM/MEDQ  D:  05/02/2007  T:  05/02/2007  Job:  [id number]   cc:   Milus Mallick. Lodema Hong, M.D.  Fax: [phone]

## 2011-03-06 NOTE — Group Therapy Note (Signed)
[name], [name]             ACCOUNT NO.:  [id number]   MEDICAL RECORD NO.:  [id number]          PATIENT TYPE:  INP   LOCATION:  A202                          FACILITY:  [facility]   PHYSICIAN:  Skeet Latch, DO    DATE OF BIRTH:  09-27-1961   DATE OF PROCEDURE:  06/29/2007  DATE OF DISCHARGE:                                 PROGRESS NOTE   SUBJECTIVE:  The patient's abdominal pain seems to be improving daily.  The patient has no obvious complaints today.  The patient's hemoglobin  did drop and the patient just completed a second unit of packed red  blood cells.  The patient denies any nausea or vomiting and her cough  has decreased today.   OBJECTIVE:  VITAL SIGNS:  Temperature is 98.9, pulse 87, respirations  18, blood pressure 113/76, saturations 96% on room air. CARDIOVASCULAR:  Regular rate and rhythm.  No rubs, gallops or murmurs.  LUNGS:  Clear to auscultation bilaterally.  No rales, rhonchi or  wheezing.  ABDOMEN:  Soft, nontender, nondistended.  No rigidity or guarding.  Positive bowel sounds.  EXTREMITIES:  No clubbing, cyanosis or edema.   LABORATORY DATA AND X-RAY FINDINGS:  White count 4.2, initial hemoglobin  7.8, hematocrit 22.9, platelets 161.  Repeat hemoglobin after second  unit of packed red blood cells showed a hemoglobin of 10.4, hematocrit  30.5.  Retic count 2.9.  RBC 2.20.  She is hemoccult negative.  Total  bilirubin 1.5, direct bilirubin 0.6, Alkaline phosphatase 147, AST 80,  ALT 41, albumin 2.6, total protein 6.5.   ASSESSMENT/PLAN:  1. Acute on chronic pancreatitis.  The elevation in amylase and lipase      seems to be related to her alcohol ingestion.  Her LFTs will      continue to be followed.  2. Macrocytic anemia.  The patient just received 2 units of packed red      blood cells.  Hemoglobin is in the 10 range at this time.  We will      get a B12 and folate level as well as a peripheral smear at next      blood draw.  3. History of  polysubstance abuse.  The patient has not noticed any      withdrawal symptoms at this time.  The patient continues to be on      Ativan withdrawal protocol.  4. Hypertension.  This seems to be well-controlled.  Will continue her      current medications.   Depending on GI recommendations, if the patient's blood count remains to  be in relatively normal range, the patient may need a hematology workup  possibly as an outpatient upon discharge, which I hope will be within  the next 1-2 days.      Skeet Latch, DO  Electronically Signed     SM/MEDQ  D:  06/29/2007  T:  06/30/2007  Job:  [id number]

## 2011-03-06 NOTE — Group Therapy Note (Signed)
[name], [name]             ACCOUNT NO.:  [id number]   MEDICAL RECORD NO.:  [id number]          PATIENT TYPE:  OBV   LOCATION:  A202                          FACILITY:  [facility]   PHYSICIAN:  Skeet Latch, DO    DATE OF BIRTH:  [birth year]   DATE OF PROCEDURE:  06/28/2007  DATE OF DISCHARGE:                                 PROGRESS NOTE   SUBJECTIVE:  The patient states that her abdominal pain seems to be  improved today. The patient's diet also has been advanced. The patient  has no on nausea and vomiting but is complaining of some coughing today.   OBJECTIVE:  VITAL SIGNS:  Temperature is 99.3, pulse 104, respirations  24, blood pressure 122/90.  The patient satting 96% on room air.  CARDIOVASCULAR:  She is still a little tachycardiac, S1-S2 normal, no  rubs, murmurs or gallops.  LUNGS:  Clear auscultation bilaterally.  No rales, rhonchi or wheezing.  ABDOMEN:  Slight abdominal pain, lower left quadrant.  No rigidity or  guarding.  Positive bowel sounds.  EXTREMITIES:  No clubbing, cyanosis or edema.   LABORATORY DATA:  White count 4.8, hemoglobin 8.3, hematocrit 24.7,  platelet count 147, sodium 132, potassium 3.5, chloride 103, CO2 24,  glucose 84, BUN 5, creatinine 0.68, calcium 7.9, amylase 181, lipase  220.   ASSESSMENT/PLAN:  1. Acute and chronic pancreatitis. Her amylase and lipase seem to be a      little elevated today, may not be significant at this time.  The      patient continues be followed by gastroenterology.  We will repeat      her LFTs and may be secondary to her alcoholic hepatitis.  2. Next for her anemia, it may be at delusional effect.  Hemoccult      stools have been ordered and her CBC will be rechecked later today.      And it may be related to her alcohol abuse, may be chronic in      nature to.  3. The patient has a history polysubstance abuse.  The patient will be      counseled regarding this.  4. Hypertension seems to be under control at  this time.  5. Tachycardia, not significant this time.  The patient may need small      dose a beta-blocker if it persists or worsens.   Anticipate the patient discharged in the next 1-2 days.      Skeet Latch, DO  Electronically Signed     SM/MEDQ  D:  06/28/2007  T:  06/28/2007  Job:  [id number]

## 2011-03-06 NOTE — Group Therapy Note (Signed)
[name], MAZOR             ACCOUNT NO.:  [id number]   MEDICAL RECORD NO.:  [id number]          PATIENT TYPE:  INP   LOCATION:  A202                          FACILITY:  [facility]   PHYSICIAN:  Dorris Singh, DO    DATE OF BIRTH:  1961/06/05   DATE OF PROCEDURE:  DATE OF DISCHARGE:                                 PROGRESS NOTE   The patient seen today resting comfortably in bed.  Discussed the  patient about current findings regarding her EGD and esophageal varices.  Discussed at length with the patient regarding her alcohol abuse and the  relationship with esophageal varices and the potential of these things  killing her.  Explained to her that if she ever did want to stop  drinking, that she would need to have a desire and state that, and then,  therefore, we would do well we could take her to the proper places for  her to achieve sobriety.   Temperature 97.8, pulse 79, respirations 16, blood pressure 110/75.  Generally this is a 50 year old [place] American female who is well-  developed, well-nourished in no acute distress.  CARDIOVASCULAR:  Heart regular rate and rhythm.  No rubs, gallops or  murmurs.  LUNGS:  Clear to auscultation bilaterally.  No rales was wheezes or  rhonchi.  ABDOMEN:  Soft, nontender, nondistended.  No JVD, no rigidity or  guarding.  Positive bowel sounds.  EXTREMITIES:  No clubbing, cyanosis or edema.   Her labs for today include a white count of 4.6, hemoglobin 10.5, and  hematocrit at 30.6, and a platelet count of 160,000.  Her CMP, sodium  137, potassium 3.5, chloride 103, CO2 27, glucose 80, BUN 2, and  creatinine 0.52, with a calcium of 7.7.   ASSESSMENT AND PLAN:  1. Acute on chronic pancreatitis.  Will go ahead and repeat her      amylase and lipase today, and continue to follow her LFTs.  2. Macrocytic anemia.  She has received 2 units of blood and she has      been stable at this point in time.  Will continue with folic acid      for on  a daily basis.  3. History of polysubstance abuse.  The patient still is not having      any withdrawal symptoms at this time.  4. Hypertension, which is well controlled.   Will plan on discharging the patient tomorrow as long as her hemoglobin  and hematocrit remain stable, and at that point in time will continue to  monitor her and change as needed.      Dorris Singh, DO  Electronically Signed     CB/MEDQ  D:  06/30/2007  T:  07/01/2007  Job:  [id number]

## 2011-03-08 ENCOUNTER — Other Ambulatory Visit: Payer: Self-pay | Admitting: Family Medicine

## 2011-03-08 ENCOUNTER — Other Ambulatory Visit (HOSPITAL_COMMUNITY): Payer: Medicare Other

## 2011-03-09 NOTE — Discharge Summary (Signed)
[name], [name]                         ACCOUNT NO.:  [id number]   MEDICAL RECORD NO.:  [id number]                   PATIENT TYPE:  INP   LOCATION:  A212                                 FACILITY:  [facility]   PHYSICIAN:  Hanley Hays. Dechurch, M.D.           DATE OF BIRTH:  [birth year]   DATE OF ADMISSION:  01/28/2003  DATE OF DISCHARGE:  02/03/2003                                 DISCHARGE SUMMARY   DISCHARGE DIAGNOSES:  1. Right middle lobe, partial right upper lobe community-acquired pneumonia.  2. Human immunodeficiency virus.  3. Hyponatremia, resolved.  4. Severe hypokalemia, resolved.  5. Hypomagnesemia.  6. Anemia of chronic disease.  7. Tobacco abuse.  8. Chronic calcific pancreatitis, current asymptomatic.  9. Gastroesophageal reflux.   DISPOSITION:  The patient was discharged to home.   FOLLOW UP:  The patient is to follow up with Dr. Lodema Hong on 02/09/2003 and  Dr. Ninetta Lights on 03/10/2003.   DISCHARGE MEDICATIONS:  1. Ceftin 250 b.i.d. to complete a three-week course.  2. Magnesium oxide 400 mg b.i.d.  3. Potassium 40 mEq b.i.d.  4. Combivir 150/300 b.i.d.  5. Darvocet 12/50 b.i.d.  6. Nexium 40 mg daily.  7. Robitussin DM 10 cc q.4-6h. p.r.n. cough.  8. Zoloft 50 mg daily.  9. Lortab 5/500 q.6h. p.r.n. pain, #30 given.   DISCHARGE INSTRUCTIONS:  Chest x-ray in four to six weeks to ensure  clearance of infiltrate.  BMP, magnesium per Dr. Lodema Hong for follow up of  electrolyte disorder.   HISTORY OF PRESENT ILLNESS AND HOSPITAL COURSE:  The patient is a 50-year-  old African American female with HIV, CD4 count unknown, who is apparently  followed by Dr. Enedina Finner in [place] who apparently does not share the  information of her HIV infection with anybody, including her significant  others who presented to the hospital complaining of right-sided chest pain,  cough, and hemoptysis which she described as blood mixed with the sputum.  She noted fever and chills.   She stated she was compliant with her usual  medications.  She was hypotensive at the time of presentation apparently,  with systolic blood pressures in the 100s.  She had been on verapamil and  Altace at the time of admission, although this has been held during the  hospital stay.  Her systolic pressures would peak at 140, but this was  actually more the exception than the rule.  In any event, the patient was  empirically treated with Zithromax and Rocephin.  Subsequent blood cultures  were negative.  It does not appear that the sputum cultures were received.  A CT of the chest revealed right middle lobe and some patchy infiltrate in  the right upper lobe which was done because of the concern of a mass noted  on the chest x-ray, although it was felt that this most likely represented  pneumonia.  She also had an incidental finding of calcifications  throughout  the pancreas.  The patient's electrolytes gradually improved.  Her potassium  was very difficult to supplement.  She was also difficult for IV access, and  on 02/01/2003 her IV came out and could not be restarted.  She was taking  p.o. but had noted some nausea.  Protonix was added to her regimen, and then  the patient shared she was taking Nexium at home.  She tolerated the  medications well.  Her nausea gradually resolved, and she was discharged to  home with the followup as noted above.   PHYSICAL EXAMINATION:  GENERAL:  At the time of discharge, she is awake,  alert, ambulating, goes down to smoke.  VITAL SIGNS:  She has been afebrile, blood pressure 112/70, pulse 88 and  regular, respirations are unlabored.  She has no distress with getting  about.  Her O2 saturations on room air are 98-99%.  LUNGS:  She has an occasional dry cough.  Lungs are diminished but clear.  ABDOMEN:  Protuberant, soft, and nontender.  BREASTS:  Pendulous but no mass.  EXTREMITIES:  Without clubbing, cyanosis, or edema.  CARDIAC:  Regular.  No  murmur.   ASSESSMENT AND PLAN:  1. Pneumonia in an immunocompromised host.  The patient clinically is     improved with her current regimen.  Will continue in follow up with her     outpatient physician.  2. Hypokalemia.  Continue supplement.  Magnesium supplementation has been     added as well.  3. History of hypertension.  She is currently not on any of her medications.     This can be resumed by an outpatient physician as indicated.  4. Human immunodeficiency virus.  CD4 count is still pending as is her viral     load.  This will be referred to her usual physician, Dr. Ninetta Lights.  5. History of reactive airways in the setting of chronic tobacco abuse.     This has been discussed with the patient.  I am not sure I had much     impact.  6. Gastroesophageal reflux.  Stable with PPI.  7. Anxiety.  The patient was placed on Zoloft and tolerated it well.  She     will be discharged on the regimen as noted above.                                               Hanley Hays Josefine Class, M.D.    FED/MEDQ  D:  02/03/2003  T:  02/03/2003  Job:  [id number]   cc:   Lodema Hong, M.D.   Lacretia Leigh. Ninetta Lights, M.D.  [address]  [place]  [place] [address]  Fax: [phone]

## 2011-03-09 NOTE — H&P (Signed)
[name], [name]                         ACCOUNT NO.:  [id number]   MEDICAL RECORD NO.:  [id number]                   PATIENT TYPE:  EMS   LOCATION:  ED                                   FACILITY:  [facility]   PHYSICIAN:  Sarita Bottom, M.D.                  DATE OF BIRTH:  1961/05/22   DATE OF ADMISSION:  01/28/2003  DATE OF DISCHARGE:                                HISTORY & PHYSICAL   PRIMARY PHYSICIAN:  Dr. Roxan Hockey.   CHIEF COMPLAINT:  I have pain in my chest and I am coughing.   HISTORY OF PRESENT ILLNESS:  The patient is a 50 year old lady with HIV  infection and hypertension.  She has been having on and off right-sided  chest pain and a cough for the past few days.  She also noticed some  hemoptysis yesterday evening and this morning.  She therefore decided to  come to the emergency room on account of the above.  She said her hemoptysis  is not massive, it just blood mixed with the sputum.   REVIEW OF SYSTEMS:  GENERAL:  She admits to fever and chills, admits to  general malaise and weakness.  CARDIOPULMONARY:  Admits to a cough  productive of yellowish sputum with some blood yesterday and this morning.  Admits to right-sided chest pain.  Denies any palpitations.  GI:  She denies  any nausea or vomiting.  Admits to diarrhea several times a day.  Admits to  reduced appetites.  CNS:  Denies any dizziness.  MUSCULOSKELETAL:  Admits to  pain in her back and also in the chest.  EXTREMITIES:  Admits to on and off  pedal edema.   PAST MEDICAL HISTORY:  1. HIV infection for a few years now.  She does not recall her CD4 count and     viral load.  2. She had a bronchoscopy done two years ago but she is not too sure about     the reasons.  3. History of asthma.  4. Hypertension.  5. Her last hospital admission was three years ago.   She denies any history of previous PCP.  Her PPD was negative last year.   CURRENT MEDICATIONS:  1. Combivir b.i.d.  2. Viracept 1250 b.i.d.  3. Verapamil 240 mg daily.  4. Altace.  5. Albuterol MDI p.r.n.   ALLERGIES:  She says she is allergic to SULFA DRUGS.   FAMILY HISTORY:  Significant for hypertension in several family members.   SOCIAL HISTORY:  She is single, she has no children.  She acquired HIV  through sex.  She smokes about a-half to one cigarette per day for several  years.  She drinks alcohol irregularly.  She says she used to use illicit  drugs but she has since stopped.   PHYSICAL EXAMINATION TODAY:  VITAL SIGNS:  Blood pressure 95/62, heart rate  100, temperature 100.2 degrees Fahrenheit.  GENERAL:  She is a young lady in moderate painful distress with intermittent  coughing.  HEENT:  She appears pale; she is anicteric.  Her oral mucosa is dry.  She  has no oral thrush.  NECK:  Supple.  She has submandibular lymphadenopathy.  She has no  thyromegaly.  There is no jugular venous distention.  CHEST:  Air entry is adequate bilaterally.  Breath sounds are vesicular.  She has no wheezes.  She has tenderness on the right anterior chest wall.  CARDIOVASCULAR:  Heart sounds 1 and 2 are normal.  Rhythm is regular.  She  has no murmurs on auscultation.  ABDOMEN:  Soft, bowel sounds are present.  No masses on deep palpation.  NEUROLOGIC:  She is alert and oriented x3.  She has no gross or focal  deficits.  EXTREMITIES:  She has no edema.   LABORATORY DATA AND DIAGNOSTICS:  A chest x-ray done in the emergency room  shows a right middle lobe opacity.  She has some perihilar densities.  There  are no acute infiltrates.  Sodium 120, potassium 2.2, BUN 5, creatinine 0.8,  chloride 88, CO2 23, glucose 92.  Wbc 12.1, neutrophil count 87%, hemoglobin  9.2, MCV 97.2, platelet count 268.   ASSESSMENT AND PLAN:  1. Fever and leukocytosis in a human immunodeficiency virus patient probably     due to pneumonia.  I will send the patient to the ward, do blood cultures     and sputum cultures.  The patient will be given IV  ceftriaxone and     Zithromax empirically and I will follow up her blood cultures.  She will     also be given supplemental oxygen via nasal cannula at 2 liters per     minute.  2. Hemoptysis and her lung mass.  There is a question of malignancy.     However, I will also rule out pulmonary tuberculosis with a PPD.  I will     follow up the patient's chest CT and do a blood gas on room air.  Call     pulmonary consult for a possible bronchoscopy.  3. Human immunodeficiency virus infection.  I will send serum for viral load     and CD4 count.  The patient will be continued on her Combivir and     Viracept.  4. Hyponatremia, probably secondary to volume depletion.  I will send serum     for osmolality and the patient will be given IV normal saline at 100     mL/hour.  Her BMET will be monitored while on admission.  5. Diarrhea.  The patient's stool will be sent for fecal leukocytes and ova     and parasites.  6. Hypokalemia.  This will be corrected with IV KCl and oral potassium.  Her     BMET will also be monitored.  7. Hypotension.  The patient's antihypertensive medication will be held at     this time because of the low blood pressure.  8. History of asthma.  The patient will be continued on her albuterol     nebulization q.6h.  9. Anemia of chronic disease.  The patient's CBC will be monitored while on     admission.   The patient is to be admitted under the hospitalist service.  Further workup  and management will depend on clinical course.  Sarita Bottom, M.D.    DW/MEDQ  D:  01/28/2003  T:  01/28/2003  Job:  [id number]

## 2011-03-09 NOTE — Discharge Summary (Signed)
[name], [name]             ACCOUNT NO.:  [id number]   MEDICAL RECORD NO.:  [id number]          PATIENT TYPE:  OBV   LOCATION:  A212                          FACILITY:  [facility]   PHYSICIAN:  Calvert Cantor, M.D.     DATE OF BIRTH:  1961-01-13   DATE OF ADMISSION:  02/12/2006  DATE OF DISCHARGE:  04/26/2007LH                                 DISCHARGE SUMMARY   PRIMARY CARE PHYSICIAN:  Dr. Lodema Hong   INFECTIOUS DISEASE SPECIALIST:  Dr. Johny Sax   GYNECOLOGIST:  Dr. Emelda Fear   DISCHARGE DIAGNOSES:  1.  Viral gastroenteritis.  2.  Hypokalemia secondary to #1.  3.  Dehydration secondary to #1.  4.  Leg cramps, somewhat improved with potassium replacement.  5.  Proteinuria.  6.  Anemia with an elevated MCV.  7.  Chest pain, most likely secondary to gastroesophageal reflux disease.  8.  Human immunodeficiency virus, on antiretroviral treatment.  9.  Cocaine abuse.  10. Marijuana abuse.  11. Hypertension.   DISCHARGE MEDICATIONS:  She can continue on all of her home medications  which are the following:  1.  Altace 5 mg daily.  2.  Verapamil 240 mg daily.  3.  Kaletra 200/50 two tablets daily.  4.  Trizivir one tablet b.i.d.  5.  Phenergan p.r.n.  6.  Ambien p.r.n. q.h.s.  7.  Albuterol inhaler p.r.n.   Her Nexium has been increased from 40 once a day to 40 twice a day.   HOSPITAL COURSE:  Gastroenteritis.  This is a 50 year old African-American  female who was admitted with nausea, vomiting and multiple watery stools.  Her potassium was low and she was dehydrated secondary to this.  She  received potassium replacement and IV fluid hydration.  Her stool was sent  for the following stool studies:  C. Difficile, which was negative; wbc's,  also negative.   She has improved.  She received two doses of Imodium and since then has not  required any more for the past few days.   She had complaints of leg cramps on admission.  Initially it was thought it  was secondary to  the potassium.  However, her potassium has been replaced.  She states that her leg pain is still present but decreased.  I am not sure  if this is polyneuropathy secondary to her Trizivir.  This should be  followed up as an outpatient.   UA was checked to rule out UTI on admission.  She was found to have greater  than 300 mg/dL of protein in her urine.   She had an elevated myoglobin which was greater than 500 on admission.  She  had recently used cocaine and this was thought to be secondary to that.  Her  CK on admission was 182.   She is anemic with a hemoglobin of 9.7, hematocrit of 28.7, and an elevated  MCV of 101.4.   On admission she was complaining of shortness of breath and chest pain.  A  VQ scan was done which was negative.  An EKG did not show any acute changes.  Cardiac markers have  been negative.  Most likely her chest pain is secondary  to gastroesophageal reflux disease and therefore her Nexium has been  increased.  If she does not improve as an outpatient she may require an  endoscopy.   FOLLOW-UP INSTRUCTIONS:  Follow up with Dr. Lodema Hong in one week.      Calvert Cantor, M.D.  Electronically Signed     SR/MEDQ  D:  02/14/2006  T:  02/15/2006  Job:  [id number]

## 2011-03-09 NOTE — Group Therapy Note (Signed)
NAMEHADASA, GASNER             ACCOUNT NO.:  [id number]   MEDICAL RECORD NO.:  [id number]          PATIENT TYPE:  OBV   LOCATION:  A212                          FACILITY:  [facility]   PHYSICIAN:  Osvaldo Shipper, MD     DATE OF BIRTH:  1961-06-02   DATE OF PROCEDURE:  02/13/2006  DATE OF DISCHARGE:                                   PROGRESS NOTE   Subjectively, the patient reports improvement in her leg cramps.  She also  reports improvement in her chest discomfort.  She continues to have nausea,  but she tolerated her breakfast this morning.  However, she continues to  have watery diarrhea.  Does not report any blood in her stools.  Does not  report any black-colored stool.  She is complaining of some nasal  congestion.   PHYSICAL EXAMINATION:  VITAL SIGNS:  Objectively, patient's vital signs  reveal that she is afebrile.  Heart rate is now in the 70s-80s.  Respiratory  rate is 16.  Blood pressure 156/88, saturation 100% on 1 liter.  LUNGS:  Clear to auscultation bilaterally.  CARDIOVASCULAR:  S1, S2 is normal, regular.  No murmurs appreciated.  No S3,  S4.  Her chest pain is more so in the epigastric region where she does have  tenderness.  ABDOMEN:  Abdominal examination, as mentioned above, reveals epigastric  tenderness.  No rebound, rigidity, or guarding.  Bowel sounds are present.  No mass or organomegaly is appreciated.  EXTREMITIES:  Without edema.  Peripheral pulses are palpable.   LAB DATA:  Reveals white count is 2.6, differential is normal, 45%  neutrophils.  Hemoglobin is 10.8.  Review of her previous labs show that  this her baseline.  MCV is 100.6, platelet count is 189.  Coag profile is  normal.  CMP shows her potassium has improved, 3.8.  Her LFTs are all  improved, as well.  Her amylase and lipase were normal from yesterday.  CK  is 332, but troponin has been negative.   ASSESSMENT AND PLAN:  This is a 50 year old African-American female with  history of human  immunodeficiency virus who presented with symptoms  suggestive of acute gastroenteritis and severe hypokalemia.  1.  Severe gastroenteritis:  Patient's symptoms have somewhat improved.  She      is tolerating some p.o. intake today.  She continues to have diarrhea      and we are awaiting stool studies at this time.  She is on PPI and      intravenous fluids.  We will cutdown her intravenous fluids today.      Hopefully, she should be ready for discharge in another day or 2. If      diarrhea does not improve other etiologies such as CMV should be      considered.  2.  Chest pain/epigastric pain:  I think her chest pain is coming from acute      gastritis.  Her cardiac enzymes have been unremarkable, though I do not      see a troponin done after the initial one in the ED.  I will order  another troponin for this morning.  Her EKG shows that her rate has come      down to within normal range.  Initial EKG showed some rate-related      changes in the form of ST segment depression in V4, 5, 6, which seems to      have subsided.  I will obtain an echocardiogram on this patient to rule      out any structural heart disease and to evaluate her ejection fraction.      If her echo is normal, then I do not think she will require further      cardiac workup.  3.  Human immunodeficiency virus:  Continue her antiretroviral treatment for      this for now.  CD4 count is pending at this time.  I think she may      follow up with her infectious disease specialist on discharge.  4.  Elevated LFTs have also started coming down.  Could be related to      possible viral infection which was causing gastroenteritis, as well.      Could have been related to her HAART medications, as well.  However,      hepatitis profile was sent yesterday, which is pending at this time.  5.  Shortness of breath seems to have resolved.  A pulmonary embolism has      been ruled out.  No lesions are noted on the chest  x-ray.  6.  Renal failure also has improved.  Creatinine today is 0.6.  This was      likely prerenal azotemia.  7.  Polysubstance abuse:  Patient will be seen by a Child psychotherapist today and      counseled for polysubstance abuse.  I have counseled her.   DISPOSITION:  I believe her symptoms will probably improve significantly or  resolve by tomorrow, and I think she should be able to be discharged by  tomorrow.  A 2-D echo is pending; however, if she does rule out, I think she  can follow up with her PMD as an outpatient.  If echo is significantly  abnormal, she will require cardiology workup.  Please note, troponin I is  also pending from this morning.      Osvaldo Shipper, MD  Electronically Signed     GK/MEDQ  D:  02/13/2006  T:  02/13/2006  Job:  [id number]   cc:   Milus Mallick. Lodema Hong, M.D.  Fax: [phone]

## 2011-03-09 NOTE — Op Note (Signed)
[facility]  Patient:    [name], LUFF Visit Number: [id number] MRN: [id number]          Service Type: DSU Location: [facility] 2853 [id number] Attending Physician:  Macario Carls Dictated by:   Theresia Majors Tanda Rockers, M.D. Proc. Date: 07/21/01 Admit Date:  07/21/2001   CC:         Syliva Overman, M.D. [facility]             Dr. Ninetta Lights infectious disease dept.                           Operative Report  DATE OF BIRTH:  September 23, 1961  PREOPERATIVE DIAGNOSIS:  Right upper lobe infiltrate.  POSTOPERATIVE DIAGNOSIS:  Right upper lobe infiltrate.  OPERATION:  Flexible bronchoscopy, brushings, washings, and cultures.  SURGEON:  Theresia Majors. Tanda Rockers, M.D.  INDICATIONS FOR PROCEDURE:  Ms. Hilburn is a 50 year old female with positive HIV.  She has had a productive cough associated with a progressive atelectatic and infiltrative process in the right upper lobe.  We have recommended that she undergo a diagnostic bronchoscopy for obtaining material for cultures and cytology.  OPERATIVE FINDINGS:  Normal endobronchial anatomy with a friable brushing taken from the right upper lobe.  Cultures, as well as washings were forwarded for cytology.  DESCRIPTION OF PROCEDURE:  The patient was taken to the operating room, placed on the table in supine position.  Anesthesia was achieved with an IV sedation augmented with a local infiltration of 1% Xylocaine instilled via the cricoid cartilage.  The flexible bronchoscope was passed via the right nostril.  The vocal cords appeared to be normal.  The trachea was normal.  The corina was sharp with no evidence of external intrinsic abnormalities.  The left lobe was left main stem and subsegmental orifices were clear.  The scope was passed into the right main stem bronchus, middle lobe orifices were clear.  The scope was passed into the right upper lobe, and sequential washings and  brushings were obtained for cytologies and cultures.  The scope was removed under direct vision without difficulty.  The patient tolerated the procedure well, was transferred from the operating room to the recovery room in satisfactory condition with stable vital signs. Dictated by:   Theresia Majors Tanda Rockers, M.D. Attending Physician:  Macario Carls DD:  07/21/01 TD:  07/21/01 Job: [id number]

## 2011-03-09 NOTE — Group Therapy Note (Signed)
   [name], [name]                         ACCOUNT NO.:  [id number]   MEDICAL RECORD NO.:  [id number]                   PATIENT TYPE:  INP   LOCATION:  A212                                 FACILITY:  [facility]   PHYSICIAN:  Edward L. Juanetta Gosling, M.D.             DATE OF BIRTH:  1961-01-03   DATE OF PROCEDURE:  02/01/2003  DATE OF DISCHARGE:                                   PROGRESS NOTE   SUBJECTIVE:  The patient says she is feeling better.  She has no new  complaints.  She is still coughing but says she is interested in going home.   OBJECTIVE:  Her physical exam shows her temperature is 97.7, pulse is 96,  respirations 18, blood pressure 142/98, O2 saturation is 98%.  Her chest  shows some rhonchi.  CT scan does not show evidence of a mass lesion.   ASSESSMENT AND PLAN:  Since she does not have a mass lesion I suspect that  she does have a pneumonia causing all of her problems and I would plan to go  ahead with treatments for pneumonia but no bronchoscopy, etc. at this point.                                               Edward L. Juanetta Gosling, M.D.    ELH/MEDQ  D:  02/01/2003  T:  02/01/2003  Job:  [id number]

## 2011-03-09 NOTE — H&P (Signed)
NAMEKINDALL, SWABY             ACCOUNT NO.:  [id number]   MEDICAL RECORD NO.:  [id number]          PATIENT TYPE:  OBV   LOCATION:  A212                          FACILITY:  [facility]   PHYSICIAN:  Osvaldo Shipper, MD     DATE OF BIRTH:  Dec 10, 1960   DATE OF ADMISSION:  02/12/2006  DATE OF DISCHARGE:  LH                                HISTORY & PHYSICAL   FAMILY DOCTOR:  Milus Mallick. Lodema Hong, M.D.   INFECTIOUS DISEASE SPECIALIST:  Lacretia Leigh. Ninetta Lights, M.D.   GYNECOLOGIST:  Tilda Burrow, M.D.   ADMITTING DIAGNOSES:  1.  Likely gastroenteritis.  2.  Rule out pancreatitis.  3.  Severe hypokalemia secondary to #1.  4.  Polysubstance abuse including cocaine accounting for some of her      symptoms.  5.  Sinus tachycardia secondary to cocaine and dehydration.  6.  History of HIV infection on HAART treatment.  7.  Chest pain, unclear etiology.   CHIEF COMPLAINT:  Nausea, vomiting, diarrhea, chest pain, shortness of  breath for two days.   HISTORY OF PRESENT ILLNESS:  The patient is a 50 year old African American  female who has a history of hypertension, history of HIV infection, who was  doing well until about yesterday when she started having nausea followed by  emesis.  She has had about five to six episodes of emesis since yesterday.  She also complained of severe cramps in her legs since yesterday as well.  The patient also gives a history of diarrhea which is described as watery  and has had at least four to six episodes today.  The patient does have some  frequent bowel movements because of some of her medications, however, she  states that these episodes are very different.  Denied any blood in the  stool or in her emesis.  No history of fever or chills at home.  She does  have some abdominal pain and she states mostly on the left side of the  abdomen.  She is also complaining of some chest pain in the retrosternal  region that is sharp, worse with lying down, eases up when  she sits up.  Denies any sour taste in her mouth.  She does admit to having severe acid  reflux disease.  She also has a cough that she describes as a dry cough, has  been ongoing for many days.  She also complains of some blood in her urine  recently.   HOME MEDICATIONS:  1.  Altace 5 mg p.o. daily.  2.  Verapamil 240 mg p.o. daily.  3.  Albuterol inhalers.  4.  Nexium once daily.,  5.  Kaletra 200/50 two tablets daily.  6.  Trizivir one tab b.i.d.  7.  Phenergan.  8.  Ambien.   ALLERGIES:  SULFA DRUGS causing hives.   PAST MEDICAL HISTORY:  1.  A diagnosis of HIV in 2000.  Her last CD4 was more than 300 done two      months ago.  Her last HIV titer was undetectable also, per patient.  2.  History of bronchitis.  3.  History of hysterectomy.  4.  Cholecystectomy.  5.  Benign breast tumor.  6.  Some knee procedure in the past.   SOCIAL HISTORY:  Lives in [place].  Lives alone.  No children.  Has a 20-  pack-year history of smoking.  Drinks alcohol only on weekends.  Absolutely  denied daily alcohol use.  Drinks about 3-6 beers on weekends.  Uses  marijuana and did use it over the weekend.  Denies using cocaine recently,  however, she was positive and she mentioned that it may have been mixed in  with her other cigarettes.   FAMILY HISTORY:  Significant for pacemaker in her mother, some heart disease  in her aunt, otherwise unremarkable.   REVIEW OF SYSTEMS:  The patient did admit to being very stressed out the  last few days because of her mother's medical problems as well as her aunt's  recent death.  Otherwise unremarkable except as mentioned in the HPI.   PHYSICAL EXAMINATION:  VITAL SIGNS:  On presentation to the ED, temperature  97.6, blood pressure is 126/93 going up to 151/110, heart rate on  presentation was in the 130s to 140s, currently is about 90 to 100 regular.  Respiratory rate is about 18, saturation 100% on room air.  GENERAL:  This is a well-developed,  well-nourished individual in no apparent  distress, very anxious and essentially complaining of pain in her legs and  her chest all the time.  HEENT:  There is no pallor.  No icterus.  Oral mucous membranes are dry.  No  oral lesions are noted.  NECK:  Soft and supple.  CARDIOVASCULAR:  S1 S2 normal, regular.  No murmurs appreciated.  LUNGS:  Clear to auscultation bilaterally.  ABDOMEN:  Revealed epigastric tenderness though also tenderness in the left  lower quadrant.  No tenderness in the right upper quadrant or right lower  quadrant.  There is no rebound, rigidity, or guarding.  No mass or  organomegaly present.  Bowel sounds appreciated.  EXTREMITIES:  Show no edema.  Peripheral pulses are palpable.   LABORATORY DATA:  Her CBC shows some monocytosis otherwise total counts are  all within normal range.  BMP initially showed sodium 128, potassium was  2.4, chloride 94, bicarb was 18, anion gap was about 16, glucose 142, BUN  22, creatinine was elevated at 2.5.  Alk phos 153, AST 75, ALT 49, total  protein 10.6, albumin 5.3, calcium 10.2, magnesium 1.6.  Subsequently sodium  came up to 132, creatinine down to 1.4.  CK is mildly elevated at 182.  Troponin has been negative x2.  Urine tox screen positive for cocaine,  opiates, and THC.   IMAGING STUDIES:  The patient had a chest x-ray which was unremarkable.  V/Q  scan was low probability for PE.  D-dimer was done prior to that and it was  normal 0.26.   IMPRESSION:  This is a 50 year old African American female who has HIV,  hypertension, acid reflux disease who presents with multiple complaints.  It  appears the patient has some symptoms suggestive of gastroenteritis.  Other  conditions to be ruled out include acute pancreatitis considering that she  is on antiretroviral agents.  Because of the recent cocaine use, colitis also needs to be entertained.  Her chest pain and tachycardia are probably  related to cocaine use.  Shortness  of breath also relates probably to drug  use and smoking at this time.   PLAN:  1.  GI.  We  will check stool studies, put her on PPI, give her a GI      cocktail, put her on antiemetics.  We will given her antidiarrheal      agents also as needed once infectious etiology is ruled out.  2.  Shortness of breath.  PE has been ruled out.  Chest x-ray is      unremarkable.  She is saturating well.  She could be having shortness of      breath because of anxiety.  No objective evidence for any pulmonary      disease process is noted at this time.  3.  Cardiac issues.  She is in sinus tachycardia complaining of chest pain.      PE has pretty much been ruled out with a negative D-dimer and a negative      V/Q scan.  She is going to be ruled out for acute coronary syndrome.      She does have positive cocaine in her urine which could be accounting      for her symptoms.  4.  Renal failure, possibly prerenal.  With IV fluids this should revert but      continue to monitor.  5.  HIV infection.  We will check a CD4 count, although she is being      followed closely by the [facility] at [facility].  I will      continue her Trizivir and Kaletra unless there is overt pancreatitis      noted.  6.  Counseling for polysubstance abuse was done.  We will ask social work to      see this patient.   Further management decisions will be based on results of initial testing and  the patient's response to treatment.      Osvaldo Shipper, MD  Electronically Signed     GK/MEDQ  D:  02/12/2006  T:  02/12/2006  Job:  [id number]   cc:   Milus Mallick. Lodema Hong, M.D.  Fax: [phone]   Lacretia Leigh. Ninetta Lights, M.D.  Fax: [phone]

## 2011-03-09 NOTE — Consult Note (Signed)
[name], [name]                         ACCOUNT NO.:  [id number]   MEDICAL RECORD NO.:  [id number]                   PATIENT TYPE:  INP   LOCATION:  A212                                 FACILITY:  [facility]   PHYSICIAN:  Edward L. Juanetta Gosling, M.D.             DATE OF BIRTH:  [birth year]   DATE OF CONSULTATION:  DATE OF DISCHARGE:                                   CONSULTATION   REASON FOR CONSULTATION:  Abnormal chest x-ray.   HISTORY OF PRESENT ILLNESS:  This is a 50 year old who has a history of HIV  infections and apparently full blown acquired immune deficiency and what  appears to be pneumonia.  She has also history of hypertension.  She says  she has been sick for 4-5 days with pain on the right side of her chest.  She has been coughing, and she has coughed up blood as well.  She is  bringing up sputum mixed with the blood which is discolored.  She says that  she has just not felt well.  She thinks she has had fever and chills, but  she has not actually measured her temperature.  When she came to the  emergency room, she was noted to have abnormal chest x-ray with some  undetermined amount of mass in the lung.  She has been seeing Dr. Lodema Hong  here in [place], and she sees Dr. Roxan Hockey, Infection Disease specialist  in [facility] for management of her HIV infection.  Of note is the fact that  she says she does not want her family to know her diagnosis.   PAST MEDICAL HISTORY:  Positive for HIV infection.  She says that she is on  a number of medications.  She does not know how far advanced her problem is.  She has asthma and hypertension.   MEDICATIONS:  1. Combivir, unknown does, b.i.d.  2. Viracept, does unknown, b.i.d.  3. Verapamil 240 mg daily.  4. Altace, dose unknown.  5. Albuterol metered-dose inhaler on a p.r.n. basis.   ALLERGIES:  Sulfa.   FAMILY HISTORY:  Positive for multiple family members with hypertension, the  parents with some coronary disease as  well.   SOCIAL HISTORY:  She smokes about a half a pack of cigarettes daily.  She  has used alcohol and she has used drugs in the past but states she has  stopped that now.   PHYSICAL EXAMINATION:  VITAL SIGNS:  Temperature 100.2, blood pressure  95/60, heart rate 100.  HEENT:  Mucous membranes are dry.  Nose and throat are clear.  CHEST:  Bronchi bilaterally.  ABDOMEN:  Soft.  EXTREMITIES:  Normal edema.   LABORATORY DATA:  Chest x-ray shows a right middle lobe opacity with some  depression of her right hilar mass as well.  She has hyponatremia with a  sodium of 120, potassium 2.2, BUN 5, creatinine 0.8, chloride 88.  White  count 12,100  neutrophils 87%,  hemoglobin 5.2.   PLAN:  At this time, I think she should have a CT scan of the chest.  Chest  x-ray that she had done on 3/24 is not available but is read as no acute  infiltrates, no mention of hilar adenopathy or mass.  I suspect all this is  infectious and not a tumor.  I am going to plan to go ahead with CT scan of  the chest.  I agree with antibiotic treatment.  We may want to discuss this  with Dr. Roxan Hockey who is her infectious disease specialist to see if he has  another suggestion.  I am not going to schedule for bronchoscopy yet.                                               Edward L. Juanetta Gosling, M.D.    ELH/MEDQ  D:  01/28/2003  T:  01/29/2003  Job:  [id number]

## 2011-03-09 NOTE — Group Therapy Note (Signed)
   [name], [name]                         ACCOUNT NO.:  [id number]   MEDICAL RECORD NO.:  [id number]                   PATIENT TYPE:  INP   LOCATION:  A212                                 FACILITY:  [facility]   PHYSICIAN:  Edward L. Juanetta Gosling, M.D.             DATE OF BIRTH:  06-30-1961   DATE OF PROCEDURE:  01/29/2003  DATE OF DISCHARGE:                                   PROGRESS NOTE   PROBLEM:  1. Pneumonia.  2. Possible mass lesion of the lung.  3. Human immunodeficiency virus disease.   SUBJECTIVE:  The patient says she feels better.  She has no new complaints.   OBJECTIVE:  Her exam today shows that her temperature is 97.8, pulse 66,  respirations 15, blood pressure is 91/53, O2 saturation 92% on room air.  Her chest is clearer than yesterday and she does look more comfortable.  Her  I&O is +1438.  Her sodium is up to 126, potassium is pending still - so at  least her hyponatremia is somewhat better.   ASSESSMENT:  We are still awaiting her CT of the chest.  My initial  impression at least is this is all infection and there is not mass, but will  discuss with Dr. Hoover Brunette.                                               Edward L. Juanetta Gosling, M.D.    ELH/MEDQ  D:  01/29/2003  T:  01/29/2003  Job:  [id number]

## 2011-03-13 ENCOUNTER — Other Ambulatory Visit: Payer: Self-pay | Admitting: Orthopaedic Surgery

## 2011-03-13 ENCOUNTER — Encounter (HOSPITAL_COMMUNITY): Payer: Medicare Other

## 2011-03-13 ENCOUNTER — Other Ambulatory Visit: Payer: Self-pay | Admitting: Pulmonary Disease

## 2011-03-13 LAB — SURGICAL PCR SCREEN: MRSA, PCR: NEGATIVE

## 2011-03-13 LAB — COMPREHENSIVE METABOLIC PANEL
BUN: 10 mg/dL (ref 6–23)
CO2: 25 mEq/L (ref 19–32)
Chloride: 101 mEq/L (ref 96–112)
Creatinine, Ser: 0.93 mg/dL (ref 0.4–1.2)
GFR calc non Af Amer: >60 mL/min (ref >60)
Total Bilirubin: 0.3 mg/dL (ref 0.3–1.2)

## 2011-03-13 LAB — CBC
Hemoglobin: 13.1 g/dL (ref 12.0–15.0)
MCH: 31 pg (ref 26.0–34.0)
MCV: 92.9 fL (ref 78.0–100.0)
RBC: 4.23 MIL/uL (ref 3.87–5.11)

## 2011-03-20 ENCOUNTER — Other Ambulatory Visit: Payer: Self-pay | Admitting: Orthopaedic Surgery

## 2011-03-20 ENCOUNTER — Ambulatory Visit (HOSPITAL_COMMUNITY)
Admission: RE | Admit: 2011-03-20 | Discharge: 2011-03-20 | Disposition: A | Discharge status: Home / Self Care | Payer: Medicare Other | Source: Ambulatory Visit | Attending: Orthopaedic Surgery | Admitting: Orthopaedic Surgery

## 2011-03-20 DIAGNOSIS — J4489 Other specified chronic obstructive pulmonary disease: Secondary | ICD-10-CM | POA: Insufficient documentation

## 2011-03-20 DIAGNOSIS — Z79899 Other long term (current) drug therapy: Secondary | ICD-10-CM | POA: Insufficient documentation

## 2011-03-20 DIAGNOSIS — I10 Essential (primary) hypertension: Secondary | ICD-10-CM | POA: Insufficient documentation

## 2011-03-20 DIAGNOSIS — J449 Chronic obstructive pulmonary disease, unspecified: Secondary | ICD-10-CM | POA: Insufficient documentation

## 2011-03-20 DIAGNOSIS — M23359 Other meniscus derangements, posterior horn of lateral meniscus, unspecified knee: Secondary | ICD-10-CM | POA: Insufficient documentation

## 2011-03-20 LAB — URINALYSIS, ROUTINE W REFLEX MICROSCOPIC
Glucose, UA: NEGATIVE mg/dL
Nitrite: NEGATIVE
Protein, ur: NEGATIVE mg/dL

## 2011-03-21 ENCOUNTER — Telehealth: Payer: Self-pay | Admitting: Family Medicine

## 2011-03-21 NOTE — Op Note (Signed)
NAMEMADDI, COLLAR             ACCOUNT NO.:  [id number]  MEDICAL RECORD NO.:  [id number]           PATIENT TYPE:  O  LOCATION:  DAYP                          FACILITY:  [facility]  PHYSICIAN:  J. Darreld Mclean, M.D. DATE OF BIRTH:  1961/05/21  DATE OF PROCEDURE: DATE OF DISCHARGE:                              OPERATIVE REPORT   PREOPERATIVE DIAGNOSIS:  Tear of right knee, lateral meniscus.  POSTOPERATIVE DIAGNOSIS:  Tear of right knee, lateral meniscus.  PROCEDURE:  Operative arthroscopy of the right knee, partial lateral meniscectomy.  ANESTHESIA:  General  SURGEON:  J. Darreld Mclean, MD  TOURNIQUET TIME:  15 minutes.  DRAINS:  None.  INDICATIONS:  The patient is a 50 year old female with pain and tenderness in her right knee.  She previously had arthroscopy of the left knee earlier this year in March.  She had more tenderness over the right knee.  MRI shows tear of lateral meniscus.  The patient understands the risks and imponderables of the procedure and  had undergone the arthroscopy on the left knee earlier this year.  She asked appropriate questions.  The patient was seen in the holding area, identified the right knee as the correct surgical site.  She placed a mark on the right knee.  I placed a mark on the right knee.  She was brought back to the operating room and given general anesthesia while supine on the operating room table.  Tourniquet and leg holder placed, deflated right upper thigh. The patient was prepped and draped in the usual manner.  A generalized time-out identifying Ms. Vandermeulen as the patient, doing her right knee arthroscopy of the lateral meniscus.  All instrumentation deemed properly positioned and working.  The operating room team knew each other.  A stab wound was made on the knee medially and an inflow cannula was inserted.  Lactated Ringer's was instilled into the knee by an infusion pump.  All scope inserted laterally.  The knee  systematically examined. Suprapatellar pouch looked good and her patella has had mild grade 2 changes.  Medially, the meniscus looked good without any loose particles and articular surfaces looked good, grade 2 changes.  Anterior cruciate was intact.  Laterally there was a diffuse posterior horn stellate tear of meniscus with grade 3-4 changes of the tibial plateau and then the distal femur.  Very small fragments, meniscus were floating free.  Using a punch and a meniscal shaver, good smooth contour of the meniscus was done and the edges of the meniscus were smoothed.  A good contour was obtained.  Knee was irrigated with the main part of lactated Ringer's.  Knee was systematically examined.  No new pathology found. Prompt pictures were taken throughout the procedure.  Wounds were reapproximated using 3-0 nylon interrupted vertical mattress manner. Tourniquet was deflated after 15 minutes.  Marcaine 0.25% instilled in each portal.  The patient tolerated the procedure well, went to recovery in good condition.  Appropriate analgesia will be given for pain.  Iwill see her in the office in 10 days.  She already has physical therapy arranged.  If any difficulties, contact me through the office  hospital beeper system.          ______________________________ Shela Commons. Darreld Mclean, M.D.     JWK/MEDQ  D:  03/20/2011  T:  03/20/2011  Job:  [id number]  Electronically Signed by Darreld Mclean M.D. on 03/21/2011 01:05:00 PM

## 2011-03-21 NOTE — H&P (Signed)
  [name], [name]             ACCOUNT NO.:  [id number]  MEDICAL RECORD NO.:  [id number]           PATIENT TYPE:  LOCATION:                                 FACILITY:  PHYSICIAN:  J. Darreld Mclean, M.D. DATE OF BIRTH:  Mar 24, 1961  DATE OF ADMISSION:  03/19/2011 DATE OF DISCHARGE:  LH                             HISTORY & PHYSICAL   The patient is scheduled for surgery on the right knee.  CHIEF COMPLAINT:  "My right knee hurts."  HISTORY OF PRESENT ILLNESS:  The patient has pain and tenderness in her right knee laterally.  She underwent arthroscopy of the left knee in March 8, has done well but her right knee has been bothering her more and more.  MRI of the right done on Feb 28, 2011, which showed a complex tear of the posterior horn of the lateral meniscus subluxation of the body.  There was some mild lateral compartment chondromalacia and some bone infarct involving the distal femur and proximal tibia.  I explained her the findings.  I wanted to get it well much better from her left knee surgery which she has done very well and she wants to proceed an arthroscopy on the right knee, having had the procedure done just recently on the other knee.  She understands the risks and the imponderables.  PAST SURGICAL HISTORY:  Previous surgery of the right knee includes breast surgery, cholecystectomy, and hysterectomy.  MEDICATIONS:  The patient is currently taking; 1. Norco 7.5/325 one every 4 hours p.r.n. pain. 2. Flexeril 10 mg q.8 h. p.r.n. spasms. 3. Voltaren 75 mg b.i.d.  ALLERGIES:  She is allergic to SULFA.  HABITS:  The patient is a cigarette smoker and uses alcohol socially.  PAST HISTORY:  Essentially negative.  FAMILY HISTORY AND SOCIAL HISTORY:  Negative.  PHYSICAL EXAMINATION:  VITAL SIGNS:  Normal limits. GENERAL:  She is alert, cooperative, and oriented. HEENT:  Negative. NECK:  Supple. LUNGS:  Clear to P and A. HEART:  Regular without murmur  heard. ABDOMEN:  Soft, without masses. EXTREMITIES:  Left knee has got well-healed surgical scars from the arthroscopy with system mild effusion.  Range of motion is very good from 0 to 110 degrees.  Right knee has an effusion about to 2-1/2 to 3+ painful.  She has got positive Murray laterally.  She has limitation to move on the right.  Range of motion 0-95 degrees.  Other extremities are negative. CNS: Intact. SKIN:  Intact.  IMPRESSION: 1. Tear, right knee, lateral meniscus. 2. Status post left knee arthroscopy, medial meniscectomy.  PLAN:  Arthroscopy of the right knee, partial lateral meniscectomy. Risk and imponderables have been explained.  Labs are pending.                                           ______________________________ J. Darreld Mclean, M.D.     JWK/MEDQ  D:  03/19/2011  T:  03/19/2011  Job:  [id number]  Electronically Signed by Darreld Mclean M.D. on 03/21/2011 01:05:05 PM

## 2011-03-23 ENCOUNTER — Ambulatory Visit (HOSPITAL_COMMUNITY)
Admission: RE | Admit: 2011-03-23 | Discharge: 2011-03-23 | Disposition: A | Discharge status: Home / Self Care | Payer: Medicare Other | Source: Ambulatory Visit | Attending: Family Medicine | Admitting: Family Medicine

## 2011-03-23 DIAGNOSIS — R262 Difficulty in walking, not elsewhere classified: Secondary | ICD-10-CM | POA: Insufficient documentation

## 2011-03-23 DIAGNOSIS — M25669 Stiffness of unspecified knee, not elsewhere classified: Secondary | ICD-10-CM | POA: Insufficient documentation

## 2011-03-23 DIAGNOSIS — M25569 Pain in unspecified knee: Secondary | ICD-10-CM | POA: Insufficient documentation

## 2011-03-23 DIAGNOSIS — I10 Essential (primary) hypertension: Secondary | ICD-10-CM | POA: Insufficient documentation

## 2011-03-23 DIAGNOSIS — IMO0001 Reserved for inherently not codable concepts without codable children: Secondary | ICD-10-CM | POA: Insufficient documentation

## 2011-03-23 DIAGNOSIS — M6281 Muscle weakness (generalized): Secondary | ICD-10-CM | POA: Insufficient documentation

## 2011-03-27 ENCOUNTER — Encounter (HOSPITAL_COMMUNITY): Payer: Medicare Other | Admitting: Psychiatry

## 2011-03-28 ENCOUNTER — Encounter: Payer: Self-pay | Admitting: Family Medicine

## 2011-03-29 ENCOUNTER — Encounter: Payer: Self-pay | Admitting: Family Medicine

## 2011-03-29 ENCOUNTER — Ambulatory Visit (INDEPENDENT_AMBULATORY_CARE_PROVIDER_SITE_OTHER): Payer: Medicare Other | Admitting: Family Medicine

## 2011-03-29 VITALS — BP 130/80 | HR 110 | Resp 16 | Ht 65.0 in | Wt 181.4 lb

## 2011-03-29 DIAGNOSIS — R32 Unspecified urinary incontinence: Secondary | ICD-10-CM

## 2011-03-29 DIAGNOSIS — J029 Acute pharyngitis, unspecified: Secondary | ICD-10-CM

## 2011-03-29 DIAGNOSIS — E669 Obesity, unspecified: Secondary | ICD-10-CM

## 2011-03-29 DIAGNOSIS — J209 Acute bronchitis, unspecified: Secondary | ICD-10-CM

## 2011-03-29 DIAGNOSIS — F17200 Nicotine dependence, unspecified, uncomplicated: Secondary | ICD-10-CM

## 2011-03-29 DIAGNOSIS — I10 Essential (primary) hypertension: Secondary | ICD-10-CM

## 2011-03-29 MED ORDER — POLYETHYLENE GLYCOL 3350 17 GM/SCOOP PO POWD: 17.0000 g | Freq: Every day | ORAL | Status: AC

## 2011-03-29 MED ORDER — FLUCONAZOLE 150 MG PO TABS: 150.0000 mg | ORAL_TABLET | Freq: Once | ORAL | Status: DC

## 2011-03-29 MED ORDER — CEFTRIAXONE SODIUM 500 MG IJ SOLR
500.0000 mg | Freq: Once | INTRAMUSCULAR | Status: AC
  Administered 2011-03-29: 500 mg via INTRAMUSCULAR

## 2011-03-29 MED ORDER — LEVOFLOXACIN 500 MG PO TABS: 500.0000 mg | ORAL_TABLET | Freq: Every day | ORAL | Status: AC

## 2011-03-29 MED ORDER — BENZONATATE 100 MG PO CAPS: 100.0000 mg | ORAL_CAPSULE | Freq: Four times a day (QID) | ORAL | Status: DC | PRN

## 2011-03-29 NOTE — Progress Notes (Signed)
  Subjective:    Patient ID: [name], female    DOB: 01/10/1961, 50 y.o.   MRN: [id number]  HPI Sore throat , chest congestion and brown sputum , fever in the past 2 days, up to 101, clear nasal drainage.symptoms started last week Thursday, 2 days after her surgery, C/o constipation, she does have iBS and is on chronic narc. 1 month h/o  Nocturia and frequency, has seen urology in the past. Recently had arthroscopy of her right knee, and reports good progress     Review of Systems  Denies chest pains, palpitations, paroxysmal nocturnal dyspnea, orthopnea and leg swelling Denies abdominal pain, nausea, vomiting Chronic neck pain, reduced mobility in knees Denies headaches, seizure, numbness, or tingling. Denies uncontroled depression, anxiety or insomnia.good response to psych treatment Denies skin break down or rash.        Objective:   Physical Exam Patient alert and oriented and in no Cardiopulmonary distress.  HEENT: No facial asymmetry, EOMI, no sinus tenderness, TM's clear, Oropharynx pink and moist.  Neck supple no adenopathy.  Chest:decreased air entry, scattered crackles, few wheezes CVS: S1, S2 no murmurs, no S3.  ABD: Soft non tender. Bowel sounds normal.  Ext: No edema  MS: decreased ROM spine, shoulders, hips and knees.  Skin: Intact, no ulcerations or rash noted.  Psych: Good eye contact, normal affect. Memory intact  anxious but not depressed appearing.  CNS: CN 2-12 intact, power, tone and sensation normal throughout.        Assessment & Plan:

## 2011-03-29 NOTE — Patient Instructions (Addendum)
You are being treated for acute bronchitis,  meds are  sent in. You need a CXR  You will start med for constipation, still may need otc laxative. Med sent in for sore throat also  F/U in 4 months.   Please think about quitting smoking.  This is very important for your health.  Consider setting a quit date, then cutting back or switching brands to prepare to stop.  Also think of the money you will save every day by not smoking.  Quick Tips to Quit Smoking: Fix a date i.e. keep a date in mind from when you would not touch a tobacco product to smoke  Keep yourself busy and block your mind with work loads or reading books or watching movies in malls where smoking is not allowed  Vanish off the things which reminds you about smoking for example match box, or your favorite lighter, or the pipe you used for smoking, or your favorite jeans and shirt with which you used to enjoy smoking, or the club where you used to do smoking  Try to avoid certain people places and incidences where and with whom smoking is a common factor to add on  Praise yourself with some token gifts from the money you saved by stopping smoking  Anti Smoking teams are there to help you. Join their programs  Anti-smoking Gums are there in many medical shops. Try them to quit smoking   Side-effects of Smoking: Disease caused by smoking cigarettes are emphysema, bronchitis, heart failures  Premature death  Cancer is the major side effect of smoking  Heart attacks and strokes are the quick effects of smoking causing sudden death  Some smokers lives end up with limbs amputated  Breathing problem or fast breathing is another side effect of smoking  Due to more intakes of smokes, carbon mono-oxide goes into your brain and other muscles of the body which leads to swelling of the veins and blockage to the air passage to lungs  Carbon monoxide blocks blood vessels which leads to blockage in the flow of blood to different major body  organs like heart lungs and thus leads to attacks and deaths  During pregnancy smoking is very harmful and leads to premature birth of the infant, spontaneous abortions, low weight of the infant during birth  Fat depositions to narrow and blocked blood vessels causing heart attacks  In many cases cigarette smoking caused infertility in men

## 2011-03-30 ENCOUNTER — Telehealth: Payer: Self-pay | Admitting: Family Medicine

## 2011-03-30 NOTE — Telephone Encounter (Signed)
She said medicaid is not covering the mouthwash. Wants to know if there is anything else she can use

## 2011-04-01 NOTE — Telephone Encounter (Signed)
Salt and water gargles made at home, followed by a teaspoon of lime juice and honey, this is soothing

## 2011-04-02 ENCOUNTER — Other Ambulatory Visit: Payer: Self-pay | Admitting: *Deleted

## 2011-04-02 MED ORDER — DIPHENOXYLATE-ATROPINE 2.5-0.025 MG PO TABS: 1.0000 | ORAL_TABLET | Freq: Four times a day (QID) | ORAL | Status: DC | PRN

## 2011-04-02 MED ORDER — VERAPAMIL HCL ER 240 MG PO CP24: 240.0000 mg | ORAL_CAPSULE | Freq: Every day | ORAL | Status: DC

## 2011-04-02 MED ORDER — HYDROXYZINE HCL 25 MG PO TABS: 25.0000 mg | ORAL_TABLET | Freq: Every day | ORAL | Status: AC | PRN

## 2011-04-02 MED ORDER — ALPRAZOLAM 1 MG PO TABS: 1.0000 mg | ORAL_TABLET | Freq: Two times a day (BID) | ORAL | Status: DC

## 2011-04-02 MED ORDER — ZOLPIDEM TARTRATE 10 MG PO TABS: 10.0000 mg | ORAL_TABLET | Freq: Every evening | ORAL | Status: DC | PRN

## 2011-04-02 NOTE — Telephone Encounter (Signed)
Called again, no answer

## 2011-04-02 NOTE — Telephone Encounter (Signed)
Called patient no answer.

## 2011-04-03 ENCOUNTER — Inpatient Hospital Stay (HOSPITAL_COMMUNITY)
Admission: RE | Admit: 2011-04-03 | Discharge: 2011-04-03 | Disposition: A | Discharge status: Home / Self Care | Payer: Medicare Other | Source: Ambulatory Visit | Attending: *Deleted | Admitting: *Deleted

## 2011-04-04 NOTE — Telephone Encounter (Signed)
Called again, no answer. Will advise if she calls back

## 2011-04-05 ENCOUNTER — Ambulatory Visit (HOSPITAL_COMMUNITY)
Admission: RE | Admit: 2011-04-05 | Discharge: 2011-04-05 | Disposition: A | Discharge status: Home / Self Care | Payer: Medicare Other | Source: Ambulatory Visit | Attending: Family Medicine | Admitting: Family Medicine

## 2011-04-07 ENCOUNTER — Encounter: Payer: Self-pay | Admitting: Family Medicine

## 2011-04-07 NOTE — Assessment & Plan Note (Signed)
Controlled, no change in medication  

## 2011-04-07 NOTE — Assessment & Plan Note (Signed)
Improved. Pt applauded on succesful weight loss through lifestyle change, and encouraged to continue same. Weight loss goal set for the next several months.  

## 2011-04-07 NOTE — Assessment & Plan Note (Signed)
Deteriorating, based on pt's report

## 2011-04-07 NOTE — Assessment & Plan Note (Signed)
Unchanged counseled to quit

## 2011-04-07 NOTE — Assessment & Plan Note (Signed)
Likely related to recent intubation,exam is normal

## 2011-04-09 ENCOUNTER — Ambulatory Visit (HOSPITAL_COMMUNITY)
Admission: RE | Admit: 2011-04-09 | Discharge: 2011-04-09 | Disposition: A | Discharge status: Home / Self Care | Payer: Medicare Other | Source: Ambulatory Visit | Attending: Family Medicine | Admitting: Family Medicine

## 2011-04-11 ENCOUNTER — Ambulatory Visit (HOSPITAL_COMMUNITY)
Admission: RE | Admit: 2011-04-11 | Discharge: 2011-04-11 | Disposition: A | Discharge status: Home / Self Care | Payer: Medicare Other | Source: Ambulatory Visit | Attending: Family Medicine | Admitting: Family Medicine

## 2011-04-12 ENCOUNTER — Encounter (INDEPENDENT_AMBULATORY_CARE_PROVIDER_SITE_OTHER): Payer: Medicare Other | Admitting: Psychiatry

## 2011-04-12 DIAGNOSIS — F3189 Other bipolar disorder: Secondary | ICD-10-CM

## 2011-04-13 ENCOUNTER — Ambulatory Visit (HOSPITAL_COMMUNITY): Payer: Medicare Other

## 2011-04-18 ENCOUNTER — Ambulatory Visit (HOSPITAL_COMMUNITY): Payer: Medicare Other | Admitting: *Deleted

## 2011-04-20 ENCOUNTER — Ambulatory Visit (HOSPITAL_COMMUNITY)
Admission: RE | Admit: 2011-04-20 | Discharge: 2011-04-20 | Disposition: A | Discharge status: Home / Self Care | Payer: Medicare Other | Source: Ambulatory Visit | Attending: Family Medicine | Admitting: Family Medicine

## 2011-04-20 ENCOUNTER — Other Ambulatory Visit: Payer: Self-pay | Admitting: Family Medicine

## 2011-04-26 ENCOUNTER — Ambulatory Visit (HOSPITAL_COMMUNITY)
Admission: RE | Admit: 2011-04-26 | Discharge: 2011-04-26 | Disposition: A | Discharge status: Home / Self Care | Payer: Medicare Other | Source: Ambulatory Visit | Attending: Family Medicine | Admitting: Family Medicine

## 2011-04-26 DIAGNOSIS — M25669 Stiffness of unspecified knee, not elsewhere classified: Secondary | ICD-10-CM | POA: Insufficient documentation

## 2011-04-26 DIAGNOSIS — R262 Difficulty in walking, not elsewhere classified: Secondary | ICD-10-CM | POA: Insufficient documentation

## 2011-04-26 DIAGNOSIS — IMO0001 Reserved for inherently not codable concepts without codable children: Secondary | ICD-10-CM | POA: Insufficient documentation

## 2011-04-26 DIAGNOSIS — M6281 Muscle weakness (generalized): Secondary | ICD-10-CM | POA: Insufficient documentation

## 2011-04-26 DIAGNOSIS — I10 Essential (primary) hypertension: Secondary | ICD-10-CM | POA: Insufficient documentation

## 2011-04-26 DIAGNOSIS — M25569 Pain in unspecified knee: Secondary | ICD-10-CM | POA: Insufficient documentation

## 2011-05-01 ENCOUNTER — Ambulatory Visit (HOSPITAL_COMMUNITY)
Admission: RE | Admit: 2011-05-01 | Discharge: 2011-05-01 | Disposition: A | Discharge status: Home / Self Care | Payer: Medicare Other | Source: Ambulatory Visit | Attending: Family Medicine | Admitting: Family Medicine

## 2011-05-01 DIAGNOSIS — M25569 Pain in unspecified knee: Secondary | ICD-10-CM | POA: Insufficient documentation

## 2011-05-01 DIAGNOSIS — M25669 Stiffness of unspecified knee, not elsewhere classified: Secondary | ICD-10-CM | POA: Insufficient documentation

## 2011-05-01 DIAGNOSIS — R262 Difficulty in walking, not elsewhere classified: Secondary | ICD-10-CM | POA: Insufficient documentation

## 2011-05-01 NOTE — Progress Notes (Signed)
Physical Therapy Treatment Patient Name: [name] Date: 05/01/2011  Time in: 9:00 Time Out:  9:55   Subjective: 71/2 B knees with pain worse on L knee but fell spasm on R hip/thigh.  PT reported a fall this weekend secondary to a knee "giving away".   Objective: Valgus knees B, crepitus   Exercise/Treatments Heel raises (2 sets of 10 reps), Toe raises 2x 10), Lateral step up 4" 10x B, Forward step up 4" 10x B, Functional squats 10x, B Abduction: 10x; Cybex leg extension: 1PL 12 reps, Cybex knee flex: 2PL 12reps. Reciprocal bike 10 mins at L2.0 for LE strengthening.    Assessment: Pt. able to complete all therapeutic exercise with c/o spasms in B quads secondary to LE weakness.  VC/demonstration required for posture/ Technique.  Pt presented with overall B LE weakness with all activities.     Plan: Continue with current POC, progress strength as tolerated.     Charges: 45 min therex   [name] 05/01/2011, 9:05 AM

## 2011-05-02 ENCOUNTER — Other Ambulatory Visit: Payer: Self-pay | Admitting: *Deleted

## 2011-05-02 MED ORDER — ESOMEPRAZOLE MAGNESIUM 40 MG PO CPDR: 40.0000 mg | DELAYED_RELEASE_CAPSULE | Freq: Every day | ORAL | Status: DC

## 2011-05-03 ENCOUNTER — Ambulatory Visit (HOSPITAL_COMMUNITY)
Admission: RE | Admit: 2011-05-03 | Discharge: 2011-05-03 | Disposition: A | Discharge status: Home / Self Care | Payer: Medicare Other | Source: Ambulatory Visit | Attending: Family Medicine | Admitting: Family Medicine

## 2011-05-03 NOTE — Progress Notes (Signed)
Physical Therapy Treatment Patient Name: [name] [id number] Date: 05/03/2011  Precautions/Restrictions     Mobility (including Balance)   Worked on gt training to have a step through gt pattern; Patient completed exercises per doc flowsheet worked on Investment banker, operational with cane.    Exercise/Treatments Modalities Modalities: Electrical Stimulation (Right IFES; L Bi-mod )  Goals   End of Session  PT - End of Session Activity Tolerance: Patient limited by pain General Behavior During Session: [facility] for tasks performed Cognition: [facility] for tasks performed PT Assessment and Plan Rehab Potential: Good PT Frequency: Min 2X/week PT Treatment/Interventions: Gait training (For step through gt and using cane with R LE) PT Plan: Begin bridge, LAQ and resume standing ex if pain is below 8/10  Time in 9:00 time out 9:50 RUSSELL,CINDY 05/03/2011, 9:44 AM

## 2011-05-10 ENCOUNTER — Ambulatory Visit (HOSPITAL_COMMUNITY)
Admission: RE | Admit: 2011-05-10 | Discharge: 2011-05-10 | Disposition: A | Discharge status: Home / Self Care | Payer: Medicare Other | Source: Ambulatory Visit | Attending: Family Medicine | Admitting: Family Medicine

## 2011-05-10 DIAGNOSIS — M25669 Stiffness of unspecified knee, not elsewhere classified: Secondary | ICD-10-CM

## 2011-05-10 DIAGNOSIS — M25569 Pain in unspecified knee: Secondary | ICD-10-CM

## 2011-05-10 DIAGNOSIS — R262 Difficulty in walking, not elsewhere classified: Secondary | ICD-10-CM

## 2011-05-10 NOTE — Progress Notes (Addendum)
Physical Therapy Treatment Patient Name: [name] [id number]'U Date: 05/10/2011 Time in 803 Initial Eval: 03/23/11 Re-eval on 04/26/11, Next due: 05/23/11 Visit: #10 Charges: 38 min TE, 1 unit E-stim HPI: Symptoms/Limitations Symptoms: "I'm doing pretty good today.  it felt good after the last treatment with those electrode things.  I did not do much over the weekend and Dr. Hilda Lias gave me better pain medication so I am feeling pretty good." Pain Assessment Currently in Pain?: Yes Pain Score:   6 Pain Location: Knee Pain Orientation: Right;Left Pain Type: Chronic pain Multiple Pain Sites: No   Mobility (including Balance) Ambulation/Gait Ambulation/Gait: Yes Ambulation/Gait Assistance: 6: Modified independent (Device/Increase time) Assistive device: Straight cane Gait Pattern: Decreased hip/knee flexion - left;Decreased hip/knee flexion - right;Trendelenburg;Antalgic (Severe Genu valgus Bilateral LE)     Exercise/Treatments Bike 6 min @ 3.0 STANDING:  Forward Lunges - modified on wall w/BUE support and tactile cueing for appropriate knee tracking and pain free range x10 ea  Hip Adduction with ball squeeze 5x5sec hold  Functional squats 10x - tactile cueing for knee position  BLE Hip abduction 10x  BLE Hip extension 10x  Knee flexion  10x 2# w/4 count up, 4 count down  Heel raises 2x10  Toe Raises 2x10 SUPINE  BLE SAQ 2# 5 sec x15 Modalities Modalities: Archivist Stimulation Location: B knees Electrical Stimulation Action: Decrease pain Electrical Stimulation Parameters: Pre mod Electrical Stimulation Goals: Pain  Goals PT Short Term Goals Short Term Goal 1 Progress: Progressing toward goal Short Term Goal 2 Progress: Progressing toward goal Short Term Goal 3 Progress: Partly met PT Long Term Goals Long Term Goal 1 Progress: Not met Long Term Goal 2 Progress: Not met Long Term Goal 3 Progress: Not met Long Term Goal  4 Progress: Not met End of Session Patient Active Problem List  Diagnoses  . ATYPICAL MYCOBACTERIAL INFECTION  . Human Immunodeficiency Virus (HIV) Disease  . HERPES ZOSTER  . IMPAIRED GLUCOSE TOLERANCE  . OBESITY, UNSPECIFIED  . TOBACCO USER  . DEPRESSION  . Unspecified Essential Hypertension  . HEMORRHOIDS  . SINUSITIS, ACUTE  . PHARYNGITIS  . Acute bronchitis  . OTHER CHRONIC SINUSITIS  . ALLERGIC RHINITIS DUE TO OTHER ALLERGEN  . ASTHMA  . GERD  . CONSTIPATION, INTERMITTENT  . ACUTE CYSTITIS  . GYNECOMASTIA  . HIP PAIN, LEFT  . NECK PAIN, LEFT  . BAKER'S CYST  . BACK PAIN WITH RADICULOPATHY  . FATIGUE  . HEADACHE  . NAUSEA  . URINARY INCONTINENCE  . ABDOMINAL PAIN  . IMPAIRED FASTING GLUCOSE  . ABNORMAL PAP SMEAR, LGSIL  . ANKLE SPRAIN  . PANCREATITIS, HX OF  . Bronchitis, acute  . Pain in joint, lower leg  . Stiffness of joint, not elsewhere classified, lower leg  . Difficulty in walking   PT - End of Session Activity Tolerance: Patient tolerated treatment well PT Assessment and Plan Clinical Impression Statement: Pt is limited by her activity tolerance (LLE >RLE), but has improved motivation to continue with standing activities.   PT Plan: Cont to address appropriate knee mechanics and improve strength and ROM.     [name] 05/10/2011, 9:01 AM

## 2011-05-11 ENCOUNTER — Ambulatory Visit (HOSPITAL_COMMUNITY)
Admission: RE | Admit: 2011-05-11 | Discharge: 2011-05-11 | Disposition: A | Discharge status: Home / Self Care | Payer: Medicare Other | Source: Ambulatory Visit

## 2011-05-11 NOTE — Progress Notes (Signed)
Physical Therapy Treatment Patient Name: [name] [id number]'U Date: 05/11/2011  Time In: 1:12 Time Out: 2:18 Visit #: 11 Next Re-eval: 05/20/2011 Charge: therex: 40 min Gait: 5 min IFES with ICE: 15 min  Subjective: Symptoms/Limitations Symptoms: 7/10, felt good after last tx but feel like I did too much when I got home last night.  Objective: amb with increase genu valgus  Exercise/Treatments Bike 6 min @ 3.0  STANDING:  Hip Adduction with ball squeeze with facing wall squats Functional squats 10x - tactile cueing for knee position  BLE Hip abduction 10x  BLE Hip extension 10x  Heel raises 2x10  Toe Raises 2x10  SUPINE  BLE SAQ 2# 5 sec x15  MODALITIES: IFES/ICE X B knees  Stability Exercises Heel Raises: 20 reps (2x 10) Hip Exercises Hip Extension: Strengthening;Both;10 reps Knee Exercises Knee Extension: 15 reps (SAQ 15reps x 5" hold) Heel Raises: 20 reps (2x 10) Hip Extension: Strengthening;Both;10 reps Hip ABduction: Strengthening;10 reps;Both;Standing Ankle Exercises Heel Raises: 20 reps (2x 10) Balance Exercises Heel Raises: 20 reps (2x 10)      PT - End of Session Activity Tolerance: Patient tolerated treatment well General Behavior During Session: WFL for tasks performed Cognition: [facility] for tasks performed PT Assessment and Plan Clinical Impression Statement: Pt limited for acitivity tolerance secondary to pain.  Pt request a hold with some therex. PT Treatment/Interventions: Gait training;Therapeutic exercise;Other (comment) (IFES with Ice for pain relief) PT Plan: Continue with current POC  [name] 05/11/2011, 2:43 PM

## 2011-05-15 ENCOUNTER — Ambulatory Visit (HOSPITAL_COMMUNITY)
Admission: RE | Admit: 2011-05-15 | Discharge: 2011-05-15 | Disposition: A | Discharge status: Home / Self Care | Payer: Medicare Other | Source: Ambulatory Visit | Attending: Family Medicine | Admitting: Family Medicine

## 2011-05-15 NOTE — Progress Notes (Signed)
Physical Therapy Treatment Patient Name: [name] Date: 05/15/2011  Time In: 08:18 Time Out: 9:10 Visit #:  12 Next Re-eval: next tx Charge: therex 20 min IFES/ICE 15  Subjective: Symptoms/Limitations Symptoms: 8/10 B LE, R>L thigh/knee couldn't move at all yesterday, pt entered deptartment in wheelchair.  Pt reports hypersensitivity B knee R>L.   Pain Assessment Currently in Pain?: Yes Pain Score:   8 Pain Location: Knee Pain Orientation: Right Pain Type: Chronic pain Pain Onset: More than a month ago Pain Relieving Factors: pain meds relieves the knee   Objective:  Pt entered session in wheelchair secondary to high pain scale B knee/weakness  Exercise/Treatments Adduction with yellow ball 15x5" SAQ with 2# 15 reps Modality: IFES/ Ice  Knee Exercises Knee Extension: 15 reps;Both;Supine (2# ) Hip ADduction: 15 reps;Supine Modalities Modalities: Cryotherapy;Electrical Stimulation Cryotherapy Number Minutes Cryotherapy: 15 Minutes Cryotherapy Location: Knee Type of Cryotherapy: Ice pack Pharmacologist Location: B knee  Electrical Stimulation Action: to reduce pain Electrical Stimulation Parameters: hi/lo interferential B knees L 15.5 Electrical Stimulation Goals: Pain  Goals   End of Session Patient Active Problem List  Diagnoses  . ATYPICAL MYCOBACTERIAL INFECTION  . Human Immunodeficiency Virus (HIV) Disease  . HERPES ZOSTER  . IMPAIRED GLUCOSE TOLERANCE  . OBESITY, UNSPECIFIED  . TOBACCO USER  . DEPRESSION  . Unspecified Essential Hypertension  . HEMORRHOIDS  . SINUSITIS, ACUTE  . PHARYNGITIS  . Acute bronchitis  . OTHER CHRONIC SINUSITIS  . ALLERGIC RHINITIS DUE TO OTHER ALLERGEN  . ASTHMA  . GERD  . CONSTIPATION, INTERMITTENT  . ACUTE CYSTITIS  . GYNECOMASTIA  . HIP PAIN, LEFT  . NECK PAIN, LEFT  . BAKER'S CYST  . BACK PAIN WITH RADICULOPATHY  . FATIGUE  . HEADACHE  . NAUSEA  . URINARY  INCONTINENCE  . ABDOMINAL PAIN  . IMPAIRED FASTING GLUCOSE  . ABNORMAL PAP SMEAR, LGSIL  . ANKLE SPRAIN  . PANCREATITIS, HX OF  . Bronchitis, acute  . Pain in joint, lower leg  . Stiffness of joint, not elsewhere classified, lower leg  . Difficulty in walking   PT - End of Session Activity Tolerance: Patient tolerated treatment well General Behavior During Session: [facility] for tasks performed Cognition: [facility] for tasks performed PT Assessment and Plan Clinical Impression Statement: Pt continues to have extreme weakness presented following therex for strengthening. Pt stated pain scale 5/10 at end of session following IFES/Ice. PT Plan: Reassess next session prior MD appt.  [name] 05/15/2011, 9:18 AM

## 2011-05-17 ENCOUNTER — Encounter (HOSPITAL_COMMUNITY): Payer: Self-pay | Admitting: Orthopaedic Surgery

## 2011-05-17 ENCOUNTER — Telehealth (HOSPITAL_COMMUNITY): Payer: Self-pay | Admitting: Physical Therapy

## 2011-05-17 ENCOUNTER — Inpatient Hospital Stay (HOSPITAL_COMMUNITY): Admission: RE | Admit: 2011-05-17 | Payer: Medicare Other | Source: Ambulatory Visit | Admitting: Physical Therapy

## 2011-05-23 ENCOUNTER — Ambulatory Visit (HOSPITAL_COMMUNITY)
Admission: RE | Admit: 2011-05-23 | Discharge: 2011-05-23 | Disposition: A | Discharge status: Home / Self Care | Payer: Medicare Other | Source: Ambulatory Visit | Attending: Orthopaedic Surgery | Admitting: Orthopaedic Surgery

## 2011-05-23 DIAGNOSIS — M6281 Muscle weakness (generalized): Secondary | ICD-10-CM | POA: Insufficient documentation

## 2011-05-23 DIAGNOSIS — I10 Essential (primary) hypertension: Secondary | ICD-10-CM | POA: Insufficient documentation

## 2011-05-23 DIAGNOSIS — R262 Difficulty in walking, not elsewhere classified: Secondary | ICD-10-CM | POA: Insufficient documentation

## 2011-05-23 DIAGNOSIS — M25569 Pain in unspecified knee: Secondary | ICD-10-CM | POA: Insufficient documentation

## 2011-05-23 DIAGNOSIS — IMO0001 Reserved for inherently not codable concepts without codable children: Secondary | ICD-10-CM | POA: Insufficient documentation

## 2011-05-23 DIAGNOSIS — M25669 Stiffness of unspecified knee, not elsewhere classified: Secondary | ICD-10-CM | POA: Insufficient documentation

## 2011-05-23 NOTE — Progress Notes (Signed)
Physical Therapy Treatment Patient Name: [name] Date: 05/23/2011  Time In: 10:30  Time Out:11:30  Visit #: 13  Charge: MMT x 1 ROM x 1 IFES/ICE 15    HPI: Symptoms/Limitations Symptoms: I feel like I'm getting weaker. I was unable to stand up off commode this morning and had to have help to get up.  Pain Assessment Currently in Pain?: Yes Pain Score:   6 Pain Location: Leg Pain Orientation: Left;Right;Proximal Pain Type: Chronic pain  Subjective: "I felt like I was getting better when I first started therapy but now I feel like I'm getting weaker."    Objective: LE strength - R,L  Hip flexion 3/5, 2/5; Hip ext 2/5,2/5; Adduction 2/5,2/5; Quad 4+/5, 3+/5; Hamstring 4/5, 3+/5 AROM R,L - 0-125, 0-120   Exercise/Treatments  Rec Bike 6'@3 .0  Modalities Modalities: Cryotherapy;Electrical Stimulation Cryotherapy Number Minutes Cryotherapy: 15 Minutes Cryotherapy Location:  (Bilateral) Type of Cryotherapy: Ice pack Pharmacologist Location: B knee  Electrical Stimulation Parameters: hi/lo interferential B knees L 26 on dynatron 950 machine Electrical Stimulation Goals: Pain  Goals PT Short Term Goals Short Term Goal 1: Pt will be independent in HEP to maximize therapeutic effect. Short Term Goal 1 Progress: Progressing toward goal Short Term Goal 2: Pt will report pain less than 5/10 during 50% of her day. Short Term Goal 2 Progress: Not met Short Term Goal 3: Pt will be independent with ambulation x5 minutes. Short Term Goal 3 Progress: Partly met Short Term Goal 4: Pt will improve AROM 0-110 degrees. Short Term Goal 4 Progress: Met PT Long Term Goals Long Term Goal 1: Pt will improve activity tolerance with reports ambulation for 20 min without increase in pain in order to participate in community activities. Long Term Goal 1 Progress: Not met Long Term Goal 2: Pt will improve LE strength in order to stand for 10  minutes to be able to complete ADLs. Long Term Goal 2 Progress: Not met Long Term Goal 3: Pt will report pain less than or equal to 4/10 for 75% of her day for improved quality of life. Long Term Goal 3 Progress: Not met Long Term Goal 4: Pt will have knee AROM WNL for improved gait mechanices to decrease risk of secondary injury. Long Term Goal 4 Progress: Met End of Session Patient Active Problem List  Diagnoses  . ATYPICAL MYCOBACTERIAL INFECTION  . Human Immunodeficiency Virus (HIV) Disease  . HERPES ZOSTER  . IMPAIRED GLUCOSE TOLERANCE  . OBESITY, UNSPECIFIED  . TOBACCO USER  . DEPRESSION  . Unspecified Essential Hypertension  . HEMORRHOIDS  . SINUSITIS, ACUTE  . PHARYNGITIS  . Acute bronchitis  . OTHER CHRONIC SINUSITIS  . ALLERGIC RHINITIS DUE TO OTHER ALLERGEN  . ASTHMA  . GERD  . CONSTIPATION, INTERMITTENT  . ACUTE CYSTITIS  . GYNECOMASTIA  . HIP PAIN, LEFT  . NECK PAIN, LEFT  . BAKER'S CYST  . BACK PAIN WITH RADICULOPATHY  . FATIGUE  . HEADACHE  . NAUSEA  . URINARY INCONTINENCE  . ABDOMINAL PAIN  . IMPAIRED FASTING GLUCOSE  . ABNORMAL PAP SMEAR, LGSIL  . ANKLE SPRAIN  . PANCREATITIS, HX OF  . Bronchitis, acute  . Pain in joint, lower leg  . Stiffness of joint, not elsewhere classified, lower leg  . Difficulty in walking   PT - End of Session Activity Tolerance: Patient tolerated treatment well General Behavior During Session: [facility] for tasks performed Cognition: [facility] for tasks performed PT Assessment and Plan  Clinical Impression Statement: Pt continues to display decreased activity tolerance. Pt reports pain decrease to 4/10 after IFES/ice. PT Treatment/Interventions: Other (comment) (IFES/ice ; mmt; ROM) PT Plan: Continue per PT POC.  Seth Bake [facility] 05/23/2011, 12:08 PM

## 2011-05-25 ENCOUNTER — Telehealth (HOSPITAL_COMMUNITY): Payer: Self-pay

## 2011-05-25 ENCOUNTER — Inpatient Hospital Stay (HOSPITAL_COMMUNITY): Admission: RE | Admit: 2011-05-25 | Payer: Medicare Other | Source: Ambulatory Visit

## 2011-05-31 ENCOUNTER — Ambulatory Visit (HOSPITAL_COMMUNITY)
Admission: RE | Admit: 2011-05-31 | Discharge: 2011-05-31 | Disposition: A | Discharge status: Home / Self Care | Payer: Medicare Other | Source: Ambulatory Visit | Attending: Family Medicine | Admitting: Family Medicine

## 2011-05-31 DIAGNOSIS — M25669 Stiffness of unspecified knee, not elsewhere classified: Secondary | ICD-10-CM

## 2011-05-31 DIAGNOSIS — M25569 Pain in unspecified knee: Secondary | ICD-10-CM

## 2011-05-31 DIAGNOSIS — R262 Difficulty in walking, not elsewhere classified: Secondary | ICD-10-CM

## 2011-06-01 NOTE — Progress Notes (Signed)
Physical Therapy Treatment Patient Name: [name] Date: 06/01/2011  HPI: Symptoms/Limitations Symptoms: Pt reports her pain is a 5/10.  She reports she can put pressure on it.  L >R knee.   Pain Assessment Pain Score:   5 Pain Location: Knee Pain Orientation: Right;Left Discussed D/C with pt and HEP and to continue with HEP and follow up w/MD to discuss continued BLE weakness.       Exercise/Treatments  IFC e-stim to B knees for pain control. x15 minutes    Goals  See D/C summary End of Session Patient Active Problem List  Diagnoses  . ATYPICAL MYCOBACTERIAL INFECTION  . Human Immunodeficiency Virus (HIV) Disease  . HERPES ZOSTER  . IMPAIRED GLUCOSE TOLERANCE  . OBESITY, UNSPECIFIED  . TOBACCO USER  . DEPRESSION  . Unspecified Essential Hypertension  . HEMORRHOIDS  . SINUSITIS, ACUTE  . PHARYNGITIS  . Acute bronchitis  . OTHER CHRONIC SINUSITIS  . ALLERGIC RHINITIS DUE TO OTHER ALLERGEN  . ASTHMA  . GERD  . CONSTIPATION, INTERMITTENT  . ACUTE CYSTITIS  . GYNECOMASTIA  . HIP PAIN, LEFT  . NECK PAIN, LEFT  . BAKER'S CYST  . BACK PAIN WITH RADICULOPATHY  . FATIGUE  . HEADACHE  . NAUSEA  . URINARY INCONTINENCE  . ABDOMINAL PAIN  . IMPAIRED FASTING GLUCOSE  . ABNORMAL PAP SMEAR, LGSIL  . ANKLE SPRAIN  . PANCREATITIS, HX OF  . Bronchitis, acute  . Pain in joint, lower leg  . Stiffness of joint, not elsewhere classified, lower leg  . Difficulty in walking    Assessment and Plan: See D/C summary.  D/C w/HEP and follow up with MD  Nikodem Leadbetter 06/01/2011, 8:03 AM

## 2011-06-07 ENCOUNTER — Encounter (HOSPITAL_COMMUNITY): Payer: Medicare Other | Admitting: Psychiatry

## 2011-06-14 ENCOUNTER — Encounter: Payer: Self-pay | Admitting: Family Medicine

## 2011-06-14 ENCOUNTER — Ambulatory Visit: Payer: Medicare Other | Admitting: Family Medicine

## 2011-06-27 ENCOUNTER — Telehealth: Payer: Self-pay | Admitting: Family Medicine

## 2011-06-27 NOTE — Telephone Encounter (Signed)
Called patient, left message.

## 2011-06-27 NOTE — Telephone Encounter (Signed)
Call surgeon re the pain she is ion. I have not seen a report yet, even if I did, surgeon needs to address the pain, pls explain

## 2011-06-28 ENCOUNTER — Ambulatory Visit (HOSPITAL_COMMUNITY): Payer: Medicare Other | Admitting: Physical Therapy

## 2011-06-28 NOTE — Telephone Encounter (Signed)
Patient was told by her surgeon to call her PCP. Patient goes to [facility] end of this month but she states she has been stumbling around and falling. States everyone is putting her off.

## 2011-06-29 NOTE — Telephone Encounter (Signed)
Post op pain is covered by surgeon unless I specifically hear otherwise from the doctor/surgeon, let her know please

## 2011-06-29 NOTE — Telephone Encounter (Signed)
Already addressed, sent to other nurse

## 2011-07-02 ENCOUNTER — Inpatient Hospital Stay (HOSPITAL_COMMUNITY)
Admission: EM | Admit: 2011-07-02 | Discharge: 2011-07-07 | DRG: 438 | Disposition: A | Discharge status: Home Health | Payer: Medicare Other | Attending: Internal Medicine | Admitting: Internal Medicine

## 2011-07-02 ENCOUNTER — Telehealth (HOSPITAL_COMMUNITY): Payer: Self-pay | Admitting: Physical Therapy

## 2011-07-02 ENCOUNTER — Other Ambulatory Visit: Payer: Self-pay

## 2011-07-02 ENCOUNTER — Inpatient Hospital Stay (HOSPITAL_COMMUNITY): Admission: RE | Admit: 2011-07-02 | Payer: Medicare Other | Source: Ambulatory Visit | Admitting: Physical Therapy

## 2011-07-02 ENCOUNTER — Emergency Department (HOSPITAL_COMMUNITY): Payer: Medicare Other

## 2011-07-02 ENCOUNTER — Encounter (HOSPITAL_COMMUNITY): Payer: Self-pay | Admitting: *Deleted

## 2011-07-02 DIAGNOSIS — R7989 Other specified abnormal findings of blood chemistry: Secondary | ICD-10-CM | POA: Diagnosis present

## 2011-07-02 DIAGNOSIS — Z21 Asymptomatic human immunodeficiency virus [HIV] infection status: Secondary | ICD-10-CM | POA: Diagnosis present

## 2011-07-02 DIAGNOSIS — M25562 Pain in left knee: Secondary | ICD-10-CM | POA: Diagnosis present

## 2011-07-02 DIAGNOSIS — E876 Hypokalemia: Secondary | ICD-10-CM | POA: Diagnosis present

## 2011-07-02 DIAGNOSIS — F419 Anxiety disorder, unspecified: Secondary | ICD-10-CM | POA: Diagnosis present

## 2011-07-02 DIAGNOSIS — M25561 Pain in right knee: Secondary | ICD-10-CM | POA: Diagnosis present

## 2011-07-02 DIAGNOSIS — F411 Generalized anxiety disorder: Secondary | ICD-10-CM | POA: Diagnosis present

## 2011-07-02 DIAGNOSIS — R269 Unspecified abnormalities of gait and mobility: Secondary | ICD-10-CM | POA: Diagnosis present

## 2011-07-02 DIAGNOSIS — R946 Abnormal results of thyroid function studies: Secondary | ICD-10-CM | POA: Diagnosis present

## 2011-07-02 DIAGNOSIS — K861 Other chronic pancreatitis: Secondary | ICD-10-CM | POA: Diagnosis present

## 2011-07-02 DIAGNOSIS — K219 Gastro-esophageal reflux disease without esophagitis: Secondary | ICD-10-CM | POA: Diagnosis present

## 2011-07-02 DIAGNOSIS — M25569 Pain in unspecified knee: Secondary | ICD-10-CM | POA: Diagnosis present

## 2011-07-02 DIAGNOSIS — K759 Inflammatory liver disease, unspecified: Secondary | ICD-10-CM | POA: Diagnosis present

## 2011-07-02 DIAGNOSIS — T375X5A Adverse effect of antiviral drugs, initial encounter: Secondary | ICD-10-CM | POA: Diagnosis present

## 2011-07-02 DIAGNOSIS — K859 Acute pancreatitis without necrosis or infection, unspecified: Principal | ICD-10-CM | POA: Diagnosis present

## 2011-07-02 DIAGNOSIS — M87059 Idiopathic aseptic necrosis of unspecified femur: Secondary | ICD-10-CM | POA: Diagnosis present

## 2011-07-02 DIAGNOSIS — B20 Human immunodeficiency virus [HIV] disease: Secondary | ICD-10-CM | POA: Diagnosis present

## 2011-07-02 DIAGNOSIS — K853 Drug induced acute pancreatitis without necrosis or infection: Secondary | ICD-10-CM | POA: Diagnosis present

## 2011-07-02 DIAGNOSIS — F319 Bipolar disorder, unspecified: Secondary | ICD-10-CM | POA: Diagnosis present

## 2011-07-02 DIAGNOSIS — I10 Essential (primary) hypertension: Secondary | ICD-10-CM | POA: Diagnosis present

## 2011-07-02 HISTORY — DX: Unspecified internal derangement of unspecified knee: M23.90

## 2011-07-02 HISTORY — DX: Shortness of breath: R06.02

## 2011-07-02 HISTORY — DX: Bipolar disorder, unspecified: F31.9

## 2011-07-02 LAB — CBC
HCT: 38.7 % (ref 36.0–46.0)
Hemoglobin: 13 g/dL (ref 12.0–15.0)
MCV: 84.9 fL (ref 78.0–100.0)
Platelets: 271 10*3/uL (ref 150–400)
RBC: 4.56 MIL/uL (ref 3.87–5.11)
WBC: 10.8 10*3/uL — ABNORMAL HIGH (ref 4.0–10.5)

## 2011-07-02 LAB — BASIC METABOLIC PANEL
BUN: 9 mg/dL (ref 6–23)
CO2: 27 mEq/L (ref 19–32)
Chloride: 101 mEq/L (ref 96–112)
Creatinine, Ser: 0.55 mg/dL (ref 0.50–1.10)

## 2011-07-02 LAB — CARDIAC PANEL(CRET KIN+CKTOT+MB+TROPI)
Relative Index: INVALID (ref 0.0–2.5)
Total CK: 53 U/L (ref 7–177)

## 2011-07-02 LAB — URINALYSIS, ROUTINE W REFLEX MICROSCOPIC
Hgb urine dipstick: NEGATIVE
Protein, ur: NEGATIVE mg/dL
Urobilinogen, UA: 4 mg/dL — ABNORMAL HIGH (ref 0.0–1.0)

## 2011-07-02 LAB — DIFFERENTIAL
Eosinophils Absolute: 0.1 10*3/uL (ref 0.0–0.7)
Lymphs Abs: 5.2 10*3/uL — ABNORMAL HIGH (ref 0.7–4.0)
Neutrophils Relative %: 47 % (ref 43–77)

## 2011-07-02 LAB — LIPASE, BLOOD: Lipase: 1018 U/L — ABNORMAL HIGH (ref 11–59)

## 2011-07-02 MED ORDER — ONDANSETRON HCL 4 MG/2ML IJ SOLN: 4.0000 mg | Freq: Four times a day (QID) | INTRAMUSCULAR | Status: DC | PRN

## 2011-07-02 MED ORDER — ALPRAZOLAM 1 MG PO TABS
1.0000 mg | ORAL_TABLET | Freq: Two times a day (BID) | ORAL | Status: DC
  Administered 2011-07-02 – 2011-07-07 (×10): 1 mg via ORAL
  Filled 2011-07-02 (×10): qty 1

## 2011-07-02 MED ORDER — ONDANSETRON HCL 4 MG/2ML IJ SOLN
4.0000 mg | Freq: Once | INTRAMUSCULAR | Status: AC
  Administered 2011-07-02: 4 mg via INTRAVENOUS
  Filled 2011-07-02: qty 2

## 2011-07-02 MED ORDER — SODIUM CHLORIDE 0.9 % IV SOLN: INTRAVENOUS | Status: DC

## 2011-07-02 MED ORDER — PANTOPRAZOLE SODIUM 40 MG IV SOLR
40.0000 mg | Freq: Two times a day (BID) | INTRAVENOUS | Status: DC
  Administered 2011-07-02 – 2011-07-04 (×4): 40 mg via INTRAVENOUS
  Filled 2011-07-02 (×4): qty 40

## 2011-07-02 MED ORDER — SODIUM CHLORIDE 0.9 % IJ SOLN
INTRAMUSCULAR | Status: AC
  Administered 2011-07-02: 10 mL
  Filled 2011-07-02: qty 10

## 2011-07-02 MED ORDER — PROMETHAZINE HCL 25 MG/ML IJ SOLN: 12.5000 mg | Freq: Four times a day (QID) | INTRAMUSCULAR | Status: DC | PRN

## 2011-07-02 MED ORDER — PROMETHAZINE HCL 12.5 MG PO TABS: 12.5000 mg | ORAL_TABLET | Freq: Four times a day (QID) | ORAL | Status: DC | PRN

## 2011-07-02 MED ORDER — ALBUTEROL SULFATE (5 MG/ML) 0.5% IN NEBU: 5.0000 mg | INHALATION_SOLUTION | RESPIRATORY_TRACT | Status: AC | PRN

## 2011-07-02 MED ORDER — NICOTINE 14 MG/24HR TD PT24
14.0000 mg | MEDICATED_PATCH | Freq: Every day | TRANSDERMAL | Status: DC
  Administered 2011-07-02 – 2011-07-07 (×7): 14 mg via TRANSDERMAL
  Filled 2011-07-02 (×6): qty 1

## 2011-07-02 MED ORDER — HYDROMORPHONE HCL 1 MG/ML IJ SOLN
1.0000 mg | INTRAMUSCULAR | Status: AC | PRN
  Administered 2011-07-02 (×2): 1 mg via INTRAVENOUS
  Filled 2011-07-02 (×2): qty 1

## 2011-07-02 MED ORDER — POTASSIUM CHLORIDE 2 MEQ/ML IV SOLN
INTRAVENOUS | Status: DC
  Administered 2011-07-02 – 2011-07-05 (×5): via INTRAVENOUS
  Filled 2011-07-02 (×9): qty 1000

## 2011-07-02 MED ORDER — SODIUM CHLORIDE 0.9 % IJ SOLN
INTRAMUSCULAR | Status: AC
  Administered 2011-07-02: 10:00:00
  Filled 2011-07-02: qty 3

## 2011-07-02 MED ORDER — ONDANSETRON HCL 4 MG/2ML IJ SOLN
4.0000 mg | Freq: Three times a day (TID) | INTRAMUSCULAR | Status: DC | PRN
  Administered 2011-07-02: 4 mg via INTRAVENOUS
  Filled 2011-07-02: qty 2

## 2011-07-02 MED ORDER — FLUTICASONE PROPIONATE HFA 44 MCG/ACT IN AERO
1.0000 | INHALATION_SPRAY | Freq: Two times a day (BID) | RESPIRATORY_TRACT | Status: DC
  Administered 2011-07-02 – 2011-07-07 (×10): 1 via RESPIRATORY_TRACT
  Filled 2011-07-02: qty 10.6

## 2011-07-02 MED ORDER — MORPHINE SULFATE 10 MG/ML IJ SOLN
8.0000 mg | Freq: Once | INTRAMUSCULAR | Status: AC
  Administered 2011-07-02: 11:00:00 via INTRAVENOUS
  Filled 2011-07-02: qty 1

## 2011-07-02 MED ORDER — HEPARIN SODIUM (PORCINE) 5000 UNIT/ML IJ SOLN
5000.0000 [IU] | Freq: Three times a day (TID) | INTRAMUSCULAR | Status: DC
  Administered 2011-07-02 – 2011-07-07 (×14): 5000 [IU] via SUBCUTANEOUS
  Filled 2011-07-02 (×14): qty 1

## 2011-07-02 MED ORDER — THIAMINE HCL 100 MG/ML IJ SOLN
100.0000 mg | Freq: Every day | INTRAMUSCULAR | Status: DC
  Administered 2011-07-03 – 2011-07-05 (×3): 100 mg via INTRAVENOUS
  Filled 2011-07-02 (×3): qty 2

## 2011-07-02 MED ORDER — KCL IN DEXTROSE-NACL 40-5-0.9 MEQ/L-%-% IV SOLN
INTRAVENOUS | Status: AC
  Filled 2011-07-02: qty 2000

## 2011-07-02 MED ORDER — ONDANSETRON HCL 4 MG PO TABS
4.0000 mg | ORAL_TABLET | Freq: Four times a day (QID) | ORAL | Status: DC | PRN
  Administered 2011-07-06: 4 mg via ORAL
  Filled 2011-07-02: qty 1

## 2011-07-02 MED ORDER — HYDRALAZINE HCL 20 MG/ML IJ SOLN
5.0000 mg | INTRAMUSCULAR | Status: DC | PRN
  Administered 2011-07-03: 5 mg via INTRAVENOUS
  Filled 2011-07-02: qty 1

## 2011-07-02 MED ORDER — CLONIDINE HCL 0.1 MG/24HR TD PTWK
0.1000 mg | MEDICATED_PATCH | TRANSDERMAL | Status: DC
  Administered 2011-07-02: 0.1 mg via TRANSDERMAL
  Filled 2011-07-02 (×2): qty 1

## 2011-07-02 NOTE — ED Notes (Signed)
Sudden onset of chest pain with increased sob and weakness that started this morning.  Denies n/v.  C/o non-productive cough.

## 2011-07-02 NOTE — ED Notes (Signed)
Patient was informed that an UA is needed and that a bedside would be brought in momentarily to attempt it. Patient agreed.

## 2011-07-02 NOTE — H&P (Signed)
[name] MRN: [id number] DOB/AGE: 1961-07-20 50 y.o. Primary Care Physician:Margaret Lodema Hong, MD, MD Admit date: 07/02/2011 Chief Complaint: Epigastric abdominal pain. HPI: The patient is a 50 year old woman with a past medical history significant for HIV, bipolar disorder, and hypertension, who presents to the emergency department today with a chief complaint of epigastric abdominal pain, radiating to the substernal area. Her symptoms started last week. At that time, her abdominal pain was accompanied by nausea and vomiting. However, the pain resolved. Last night, the pain returned and intensified to 10 over 10 in intensity. She describes the pain as a stabbing pain. It radiates to her mid back. She has had mild nausea but no vomiting. She denies diarrhea and constipation. Her last bowel movement was yesterday. She has had no bloody stools or black tarry stools. She has had some intermittent pressure when urinating. She believes that she may have had a fever and chills but she is not completely sure. She denies any recent alcohol use. She says that she no longer has a gallbladder.  In the emergency department, she is noted to be afebrile and hemodynamically stable. Her EKG reveals normal sinus rhythm with a heart rate of 95 beats per minute as reported by the emergency department physician. Her lab data are significant for a lipase of 1018 and a serum potassium of 3.1. She is being admitted for further evaluation and management.  Past Medical History  Diagnosis Date  . Baker's cyst   . Pulmonary lesion 1/03    on RUL onn CT   . Hemorrhoids   . Zoster 2001  . Asthma   . Hypertension   . Family history of diabetes mellitus   . Tobacco user   . Other abnormal Papanicolaou smear of cervix and cervical HPV   . Herpes zoster   . Hemorrhoids   . Baker's cyst   . Pancreatitis   . Hypertension   . GERD (gastroesophageal reflux disease)   . Asthma   . Shortness of breath   . HPV (human  papilloma virus) infection   . HIV disease   . Bipolar disorder     With hx of psychotic features.  . Articular cartilage disorder of knee     Past Surgical History  Procedure Date  . Right breast lumpectomy   . Total abdominal hysterectomy w/ bilateral salpingoophorectomy   . Cholecystectomy   . Left knee athroscopy   . Right knee arthoscophy 02/2011  . Breast surgery   . Cholecystectomy     Prior to Admission medications   Medication Sig Start Date End Date Taking? Authorizing Provider  abacavir-lamivudine-zidovudine (TRIZIVIR) 300-150-300 MG per tablet Take 1 tablet by mouth 2 (two) times daily. Take one tablet by mouth twice a day    Yes Historical Provider, MD  albuterol (VENTOLIN HFA) 108 (90 BASE) MCG/ACT inhaler Inhale 2 puffs into the lungs every 6 (six) hours as needed. 2 puffs every 4-6 hours as needed for shortness of breath   Yes Historical Provider, MD  ALPRAZolam Prudy Feeler) 1 MG tablet Take 1 mg by mouth 2 (two) times daily. Take 1 tablet by mouth two times a day as needed for anxiety  04/02/11  Yes Syliva Overman, MD  beclomethasone (QVAR) 40 MCG/ACT inhaler Inhale 2 puffs into the lungs 2 (two) times daily. 2 puffs two times a day    Yes Historical Provider, MD  diazepam (VALIUM) 5 MG tablet Take 5 mg by mouth every 6 (six) hours as needed. Muscle spasms  Yes Historical Provider, MD  esomeprazole (NEXIUM) 40 MG capsule Take 1 capsule (40 mg total) by mouth daily before breakfast. Take one capsule by mouth once a day 05/02/11  Yes Syliva Overman, MD  HYDROcodone-acetaminophen (VICODIN ES) 7.5-750 MG per tablet Take 1 tablet by mouth 2 (two) times daily. Take 1 tablet by mouth two times a day as needed for uncontrolled pain     Yes Historical Provider, MD  ritonavir (NORVIR) 100 MG TABS Take 100 mg by mouth. Take one tablet by mouth once a day    Yes Historical Provider, MD  verapamil (VERELAN PM) 240 MG 24 hr capsule Take 1 capsule (240 mg total) by mouth daily. 04/02/11  04/01/12 Yes Syliva Overman, MD  zolpidem (AMBIEN) 10 MG tablet Take 1 tablet (10 mg total) by mouth at bedtime as needed. One tablet by mouth at bedtime as needed 04/02/11  Yes Syliva Overman, MD  benzonatate (TESSALON PERLES) 100 MG capsule Take 1 capsule (100 mg total) by mouth every 6 (six) hours as needed for cough. 03/29/11 03/28/12  Syliva Overman, MD  darunavir (PREZISTA) 400 MG tablet Take 400 mg by mouth daily. 2 tab once daily     Historical Provider, MD  Gabapentin, PHN, 300 MG TABS Take 300 mg by mouth at bedtime. One cap by mouth at bedtime     Historical Provider, MD    Allergies:  Allergies  Allergen Reactions  . Aspirin Nausea And Vomiting  . Penicillins     REACTION: diarrhea  . Sulfonamide Derivatives     REACTION: hives    Family history: Her mother died of lupus. Her father died of complications of diabetes mellitus.  Social History:  She is divorced. She has no children. She lives in [place]. She is unemployed. She receives disability benefits. She smokes a half a pack of cigarettes per day. She drinks on occasion, but none lately. She smokes marijuana occasionally, no other illicit drug use. reports that she has been smoking Cigarettes. She has been smoking about 1 pack per day. She does      ROS: Her review of systems is positive for what is above in the history of present illness. In addition, she has chronic knee pain, greater right than left. Otherwise review of systems is negative.  PHYSICAL EXAM: Blood pressure 151/104, pulse 108, temperature 97.5 F (36.4 C), temperature source Oral, resp. rate 18, height 5\' 4"  (1.626 m), weight 79.1 kg (174 lb 6.1 oz), SpO2 95.00%.  General: The patient is currently sitting up in bed, in no acute distress. HEENT: Head is normocephalic, nontraumatic. Pupils are equal, round, and reactive to light. Extraocular movements are intact. Conjunctivae are clear. Sclerae are white. Oropharynx reveals very dry mucous membranes.  No posterior exudates or erythema. Neck: Supple, no adenopathy, no thyromegaly, no JVD. Lungs: Clear to auscultation bilaterally with decreased breath sounds in the bases. Heart: S1, S2, with a soft systolic murmur, and mild tachycardia. Abdomen: Mildly obese, hypoactive bowel sounds, moderately tender in the epigastrium, no rebound, no distention, no masses palpated. GU and rectal deferred. Extremities pedal pulses palpable bilaterally. No pretibial edema and no pedal edema. Neurologic/psychological. Flat affect. She appears to be somewhat evasive with some of her answers. Her speech is clear. She appears to have some short-term memory retrieval. Otherwise cranial nerves II through XII are intact. Strength is 5 over 5 throughout. Sensation is intact. Gait is within normal limits.  Basic Metabolic Panel:  Basename 07/02/11 1009  NA 138  K  3.1*  CL 101  CO2 27  GLUCOSE 106*  BUN 9  CREATININE 0.55  CALCIUM 9.4  MG 1.6  PHOS --   Liver Function Tests: No results found for this basename: AST:2,ALT:2,ALKPHOS:2,BILITOT:2,PROT:2,ALBUMIN:2 in the last 72 hours  Basename 07/02/11 1026  LIPASE 1018*  AMYLASE --   No results found for this basename: AMMONIA:2 in the last 72 hours CBC:  Basename 07/02/11 1026 07/02/11 1009  WBC -- 10.8*  NEUTROABS 5.0 --  HGB -- 13.0  HCT -- 38.7  MCV -- 84.9  PLT -- 271   Cardiac Enzymes:  Basename 07/02/11 1009  CKTOTAL 53  CKMB 1.4  CKMBINDEX --  TROPONINI <0.30   BNP: No results found for this basename: POCBNP:3 in the last 72 hours D-Dimer: No results found for this basename: DDIMER:2 in the last 72 hours CBG: No results found for this basename: GLUCAP:6 in the last 72 hours Hemoglobin A1C: No results found for this basename: HGBA1C in the last 72 hours Fasting Lipid Panel: No results found for this basename: CHOL,HDL,LDLCALC,TRIG,CHOLHDL,LDLDIRECT in the last 72 hours Thyroid Function Tests: No results found for this basename:  TSH,T4TOTAL,FREET4,T3FREE,THYROIDAB in the last 72 hours Anemia Panel: No results found for this basename: VITAMINB12,FOLATE,FERRITIN,TIBC,IRON,RETICCTPCT in the last 72 hours Urine Drug Screen:  Alcohol Level: No results found for this basename: ETH:2 in the last 72 hours Urinalysis: Urine color yellow, urine specific gravity 1.02, urine glucose 100, urine bilirubin small, urine ketones negative, urine protein negative, urine nitrite negative, urine leukocytes negative.  Misc. Labs:    No results found for this or any previous visit (from the past 240 hour(s)).   Results for orders placed during the hospital encounter of 07/02/11 (from the past 48 hour(s))  CBC     Status: Abnormal   Collection Time   07/02/11 10:09 AM      Component Value Range Comment   WBC 10.8 (*) 4.0 - 10.5 (K/uL)    RBC 4.56  3.87 - 5.11 (MIL/uL)    Hemoglobin 13.0  12.0 - 15.0 (g/dL)    HCT 98.1  19.1 - 47.8 (%)    MCV 84.9  78.0 - 100.0 (fL)    MCH 28.5  26.0 - 34.0 (pg)    MCHC 33.6  30.0 - 36.0 (g/dL)    RDW 29.5  62.1 - 30.8 (%)    Platelets 271  150 - 400 (K/uL)   BASIC METABOLIC PANEL     Status: Abnormal   Collection Time   07/02/11 10:09 AM      Component Value Range Comment   Sodium 138  135 - 145 (mEq/L)    Potassium 3.1 (*) 3.5 - 5.1 (mEq/L)    Chloride 101  96 - 112 (mEq/L)    CO2 27  19 - 32 (mEq/L)    Glucose, Bld 106 (*) 70 - 99 (mg/dL)    BUN 9  6 - 23 (mg/dL)    Creatinine, Ser 6.57  0.50 - 1.10 (mg/dL)    Calcium 9.4  8.4 - 10.5 (mg/dL)    GFR calc non Af Amer >60  >60 (mL/min)    GFR calc Af Amer >60  >60 (mL/min)   CARDIAC PANEL(CRET KIN+CKTOT+MB+TROPI)     Status: Normal   Collection Time   07/02/11 10:09 AM      Component Value Range Comment   Total CK 53  7 - 177 (U/L)    CK, MB 1.4  0.3 - 4.0 (ng/mL)    Troponin  I <0.30  <0.30 (ng/mL)    Relative Index RELATIVE INDEX IS INVALID  0.0 - 2.5    MAGNESIUM     Status: Normal   Collection Time   07/02/11 10:09 AM       Component Value Range Comment   Magnesium 1.6  1.5 - 2.5 (mg/dL)   DIFFERENTIAL     Status: Abnormal   Collection Time   07/02/11 10:26 AM      Component Value Range Comment   Neutrophils Relative 47  43 - 77 (%)    Neutro Abs 5.0  1.7 - 7.7 (K/uL)    Lymphocytes Relative 48 (*) 12 - 46 (%)    Lymphs Abs 5.2 (*) 0.7 - 4.0 (K/uL)    Monocytes Relative 4  3 - 12 (%)    Monocytes Absolute 0.5  0.1 - 1.0 (K/uL)    Eosinophils Relative 1  0 - 5 (%)    Eosinophils Absolute 0.1  0.0 - 0.7 (K/uL)    Basophils Relative 0  0 - 1 (%)    Basophils Absolute 0.0  0.0 - 0.1 (K/uL)   LIPASE, BLOOD     Status: Abnormal   Collection Time   07/02/11 10:26 AM      Component Value Range Comment   Lipase 1018 (*) 11 - 59 (U/L)   URINALYSIS, ROUTINE W REFLEX MICROSCOPIC     Status: Abnormal   Collection Time   07/02/11  3:09 PM      Component Value Range Comment   Color, Urine YELLOW  YELLOW     Appearance CLEAR  CLEAR     Specific Gravity, Urine 1.020  1.005 - 1.030     pH 6.0  5.0 - 8.0     Glucose, UA 100 (*) NEGATIVE (mg/dL)    Hgb urine dipstick NEGATIVE  NEGATIVE     Bilirubin Urine SMALL (*) NEGATIVE     Ketones, ur NEGATIVE  NEGATIVE (mg/dL)    Protein, ur NEGATIVE  NEGATIVE (mg/dL)    Urobilinogen, UA 4.0 (*) 0.0 - 1.0 (mg/dL)    Nitrite NEGATIVE  NEGATIVE     Leukocytes, UA NEGATIVE  NEGATIVE  MICROSCOPIC NOT DONE ON URINES WITH NEGATIVE PROTEIN, BLOOD, LEUKOCYTES, NITRITE, OR GLUCOSE <1000 mg/dL.    Dg Abd Acute W/chest  07/02/2011  *RADIOLOGY REPORT*  Clinical Data: Epigastric and chest pain  ACUTE ABDOMEN SERIES (ABDOMEN 2 VIEW & CHEST 1 VIEW)  Comparison: 06/27/2007  Findings: Enlargement of cardiac silhouette. Mediastinal contours and pulmonary vascularity normal. Decreased lung volumes and bibasilar atelectasis. Upper lungs clear. Surgical clips right upper quadrant question cholecystectomy. Normal bowel gas pattern. Scattered stool throughout colon. No bowel dilatation, bowel wall  thickening or free intraperitoneal air. Pelvic phleboliths bilaterally. 3 mm calcification projects over upper pole of left kidney question calculus. Additional small calcifications project over expected position of pancreas question chronic calcific pancreatitis. Deformity and destruction of bilateral femoral heads new since previous exam consistent with bilateral avascular necrosis.  IMPRESSION: Bibasilar atelectasis. Benign bowel gas pattern. Chronic calcific pancreatitis. Nonobstructing left renal calculus. 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  Original Report Authenticated By: Lollie Marrow, M.D.    Impression:  Active Problems:  Drug induced acute pancreatitis  Hypokalemia  HIV (human immunodeficiency virus infection)  Bilateral knee pain  Avascular necrosis of femur head  Chronic pancreatitis  HTN (hypertension)  GERD (gastroesophageal reflux disease)  1. Acute pancreatitis. The patient has a history of a cholecystectomy.  She denies any recent alcohol use, however, she appeared to be somewhat evasive. She is on multiple antiretrovirals medications, some of which, have probably caused acute pancreatitis. Of note, the abdominal x-ray shows calcific pancreatitis, consistent with chronic pancreatitis.  2. Chronic HIV infection. She is followed by infectious diseases physician, Dr. Ninetta Lights.  3. Hypertension. She is treated with clonidine.  4. Hypokalemia. Hypomagnesemia will need to be assessed.  5. Gastroesophageal reflux disease. She says that she is treated with Nexium chronically.  6. Bilateral knee pain, secondary to bilateral meniscus tears, status post recent surgery by Dr. Hilda Lias. Per the abdominal x-ray, she is noted to have avascular necrosis of both femoral heads.   Plan: 1. The patient will be made n.p.o. We will start analgesic management with as needed Dilaudid. Antiemetic therapy will be ordered with Zofran and Phenergan. IV fluids  for hydration will be started. We will add dextrose to the IV fluids. We'll add potassium chloride to the IV fluids. She will be started on thiamine 100 mg intravenous daily empirically. We will continue proton pump inhibitor therapy with Protonix IV.  We will add a clonidine patch while she is n.p.o. We will also order as needed hydralazine for a potential of worsening hypertension.  We will obviously hold her HIV medications. I will try to discuss this with her primary infectious diseases physician, Dr. Ninetta Lights.  We will check a blood magnesium level to rule out deficiency. We will also order TSH and free T4 for evaluation. We will monitor her blood lipase level daily.     Thi Klich 07/02/2011, 7:32 PM

## 2011-07-02 NOTE — ED Provider Notes (Signed)
History   Chart scribed for Carleene Cooper III, MD by Enos Fling; the patient was seen in room APA06/APA06; this patient's care was started at 10:10 AM.    CSN: [id number] Arrival date & time: 07/02/2011  9:48 AM  Chief Complaint  Patient presents with  . Chest Pain   HPI [name] is a 50 y.o. female with h/o asthma and bronchitis who presents to the Emergency Department complaining of chest pain. Pt reports sudden onset of chest pain 6 hours ago located in low central chest/epigastrium with associated sob and generalized weakness. Denies diaphoresis or n/v. Pain is constant and non-radiating, described as pressure and sharp. Pt has tried nexium as well as Qvar inhaler with no relief. Pt reports knee pain s/p surgery that is unchanged from usual. Also with recent dry cough; no fever, chills, or congestion.  PCP Dr. Lodema Hong  Past Medical History  Diagnosis Date  . Baker's cyst   . Pulmonary lesion 1/03    on RUL onn CT   . Hemorrhoids   . Zoster 2001  . Asthma   . HIV disease   . Hypertension   . Family history of diabetes mellitus   . Tobacco user   . Other abnormal Papanicolaou smear of cervix and cervical HPV   . Herpes zoster   . Hemorrhoids   . Baker's cyst   . Pancreatitis   . Hypertension   . GERD (gastroesophageal reflux disease)   . Asthma   Bronchitis  Past Surgical History  Procedure Date  . Right breast lumpectomy   . Total abdominal hysterectomy w/ bilateral salpingoophorectomy   . Cholecystectomy   . Left knee athroscopy   . Right knee arthoscophy 02/2011  Knee surgeries by Dr. Hilda Lias  Family History  Problem Relation Age of Onset  . Diabetes      Family History  . Hypertension      Family history   . Hypertension Mother   . Diabetes Mother   . Diabetes Father   . Cancer Sister 87    x2 (liver)  . Diabetes Brother     History  Substance Use Topics  . Smoking status: Current Everyday Smoker -- 1.0 packs/day    Types: Cigarettes    . Smokeless tobacco: Not on file  . Alcohol Use: No    OB History    Grav Para Term Preterm Abortions TAB SAB Ect Mult Living                 Previous Medications   ABACAVIR-LAMIVUDINE-ZIDOVUDINE (TRIZIVIR) 300-150-300 MG PER TABLET    Take 1 tablet by mouth 2 (two) times daily. Take one tablet by mouth twice a day    ALBUTEROL (VENTOLIN HFA) 108 (90 BASE) MCG/ACT INHALER    Inhale 2 puffs into the lungs every 6 (six) hours as needed. 2 puffs every 4-6 hours as needed for shortness of breath   ALUM & MAG HYDROXIDE-SIMETH (MAGIC MOUTHWASH) SOLN    Take 10 mLs by mouth 3 (three) times daily.   BECLOMETHASONE (QVAR) 40 MCG/ACT INHALER    Inhale 2 puffs into the lungs 2 (two) times daily. 2 puffs two times a day    BENZONATATE (TESSALON PERLES) 100 MG CAPSULE    Take 100 mg by mouth 3 (three) times daily. Take one tablet by mouth three times a day    BENZONATATE (TESSALON PERLES) 100 MG CAPSULE    Take 1 capsule (100 mg total) by mouth every 6 (six) hours as  needed for cough.   CLONIDINE HCL 0.1 MG TB12    Take by mouth. Take one tablet by mouth at bedtime    CYCLOBENZAPRINE HCL PO    Take 10 mg by mouth at bedtime as needed. Take 1 tablet by mouth at bedtime as needed for back spasm    DARUNAVIR (PREZISTA) 400 MG TABLET    Take 400 mg by mouth daily. 2 tab once daily    DIAZEPAM (VALIUM) 5 MG TABLET    Take 5 mg by mouth every 6 (six) hours as needed. Muscle spasms    ESOMEPRAZOLE (NEXIUM) 40 MG CAPSULE    Take 1 capsule (40 mg total) by mouth daily before breakfast. Take one capsule by mouth once a day   FLUCONAZOLE (DIFLUCAN) 150 MG TABLET    Take 1 tablet (150 mg total) by mouth once. Take one tablet by mouth once a day as needed for itching   GABAPENTIN, PHN, 300 MG TABS    Take 300 mg by mouth at bedtime. One cap by mouth at bedtime    HYDROCODONE-ACETAMINOPHEN (VICODIN ES) 7.5-750 MG PER TABLET    Take 1 tablet by mouth 2 (two) times daily. Take 1 tablet by mouth two times a day as needed  for uncontrolled pain     LEVOFLOXACIN (LEVAQUIN) 500 MG TABLET    Take 500 mg by mouth daily. Take one tablet by mouth once a day    MELOXICAM (MOBIC) 15 MG TABLET    Take 15 mg by mouth daily as needed. Take one tablet by mouth once a day as needed for uncontrolled pain    PHENERGAN 25 MG TABLET    TAKE (1) TABLET BY MOUTH TWICE A DAY AS NEEDED FORNAUSEA.   QUETIAPINE (SEROQUEL) 100 MG TABLET    Take 100 mg by mouth at bedtime. Take one tablet by mouth at bedtime    RITONAVIR (NORVIR) 100 MG TABS    Take 100 mg by mouth. Take one tablet by mouth once a day    VERAPAMIL (VERELAN PM) 240 MG 24 HR CAPSULE    Take 1 capsule (240 mg total) by mouth daily.   ZOLPIDEM (AMBIEN) 10 MG TABLET    Take 1 tablet (10 mg total) by mouth at bedtime as needed. One tablet by mouth at bedtime as needed  Does not take clonidine anymore  Allergies as of 07/02/2011 - Review Complete 07/02/2011  Allergen Reaction Noted  . Aspirin Nausea And Vomiting 07/02/2011  . Penicillins  01/05/2009  . Sulfonamide derivatives       Review of Systems  Constitutional: Negative for fever and unexpected weight change.  HENT: Negative for ear pain and sore throat.   Eyes: Negative for visual disturbance.  Respiratory: Positive for cough and shortness of breath.   Cardiovascular: Positive for chest pain.  Gastrointestinal: Negative for vomiting and diarrhea.  Genitourinary: Negative.   Musculoskeletal: Negative for myalgias.  Skin: Negative for rash.  Neurological: Positive for weakness. Negative for dizziness, seizures and syncope.  Hematological: Negative.   Psychiatric/Behavioral: Negative.     Physical Exam  BP 135/98  Pulse 101  Temp(Src) 97.6 F (36.4 C) (Oral)  Resp 20  Ht 5\' 3"  (1.6 m)  Wt 170 lb (77.111 kg)  BMI 30.11 kg/m2  SpO2 98%  Physical Exam  Nursing note and vitals reviewed. Constitutional: She is oriented to person, place, and time. She appears well-developed and well-nourished. No distress.    HENT:  Head: Normocephalic.  Nose: Nose normal.  Mouth/Throat: Oropharynx is clear and moist and mucous membranes are normal.  Eyes: Conjunctivae are normal. Pupils are equal, round, and reactive to light.  Neck: Normal range of motion. Neck supple.  Cardiovascular: Normal rate, regular rhythm and intact distal pulses.  Exam reveals no gallop and no friction rub.   No murmur heard. Pulmonary/Chest: Effort normal and breath sounds normal. She has no wheezes. She has no rales. She exhibits no tenderness.  Abdominal: Soft. There is tenderness (epigastric).  Musculoskeletal: Normal range of motion.  Neurological: She is alert and oriented to person, place, and time.  Skin: Skin is warm and dry. No rash noted.  Psychiatric: She has a normal mood and affect.    Procedures - none   OTHER DATA REVIEWED: Nursing notes and vital signs reviewed. Prior records reviewed.   DIAGNOSTIC STUDIES: Oxygen Saturation is 100% on room air, normal by my Interpretation.  EKG:  Date: 07/02/2011  Rate:95  Rhythm: normal sinus rhythm  QRS Axis: normal  Intervals: normal  ST/T Wave abnormalities: normal  Conduction Disutrbances:none QRS:  Poor R wave progression  In precordial leads suggests possible old anterior myocardial infarction.  Narrative Interpretation: Abnormal EKG, unchanged from prior  Old EKG Reviewed: unchanged  Cardiac Monitor: 07/02/2011 10:10 AM Sinus, rate 92, no ectopy   LABS / RADIOLOGY: Results for orders placed during the hospital encounter of 07/02/11  CBC      Component Value Range   WBC 10.8 (*) 4.0 - 10.5 (K/uL)   RBC 4.56  3.87 - 5.11 (MIL/uL)   Hemoglobin 13.0  12.0 - 15.0 (g/dL)   HCT 04.5  40.9 - 81.1 (%)   MCV 84.9  78.0 - 100.0 (fL)   MCH 28.5  26.0 - 34.0 (pg)   MCHC 33.6  30.0 - 36.0 (g/dL)   RDW 91.4  78.2 - 95.6 (%)   Platelets 271  150 - 400 (K/uL)  BASIC METABOLIC PANEL      Component Value Range   Sodium 138  135 - 145 (mEq/L)   Potassium 3.1  (*) 3.5 - 5.1 (mEq/L)   Chloride 101  96 - 112 (mEq/L)   CO2 27  19 - 32 (mEq/L)   Glucose, Bld 106 (*) 70 - 99 (mg/dL)   BUN 9  6 - 23 (mg/dL)   Creatinine, Ser 2.13  0.50 - 1.10 (mg/dL)   Calcium 9.4  8.4 - 08.6 (mg/dL)   GFR calc non Af Amer >60  >60 (mL/min)   GFR calc Af Amer >60  >60 (mL/min)  CARDIAC PANEL(CRET KIN+CKTOT+MB+TROPI)      Component Value Range   Total CK 53  7 - 177 (U/L)   CK, MB 1.4  0.3 - 4.0 (ng/mL)   Troponin I <0.30  <0.30 (ng/mL)   Relative Index RELATIVE INDEX IS INVALID  0.0 - 2.5   DIFFERENTIAL      Component Value Range   Neutrophils Relative 47  43 - 77 (%)   Neutro Abs 5.0  1.7 - 7.7 (K/uL)   Lymphocytes Relative 48 (*) 12 - 46 (%)   Lymphs Abs 5.2 (*) 0.7 - 4.0 (K/uL)   Monocytes Relative 4  3 - 12 (%)   Monocytes Absolute 0.5  0.1 - 1.0 (K/uL)   Eosinophils Relative 1  0 - 5 (%)   Eosinophils Absolute 0.1  0.0 - 0.7 (K/uL)   Basophils Relative 0  0 - 1 (%)   Basophils Absolute 0.0  0.0 - 0.1 (K/uL)  LIPASE,  BLOOD      Component Value Range   Lipase 1018 (*) 11 - 59 (U/L)   Dg Abd Acute W/chest  07/02/2011  *RADIOLOGY REPORT*  Clinical Data: Epigastric and chest pain  ACUTE ABDOMEN SERIES (ABDOMEN 2 VIEW & CHEST 1 VIEW)  Comparison: 06/27/2007  Findings: Enlargement of cardiac silhouette. Mediastinal contours and pulmonary vascularity normal. Decreased lung volumes and bibasilar atelectasis. Upper lungs clear. Surgical clips right upper quadrant question cholecystectomy. Normal bowel gas pattern. Scattered stool throughout colon. No bowel dilatation, bowel wall thickening or free intraperitoneal air. Pelvic phleboliths bilaterally. 3 mm calcification projects over upper pole of left kidney question calculus. Additional small calcifications project over expected position of pancreas question chronic calcific pancreatitis. Deformity and destruction of bilateral femoral heads new since previous exam consistent with bilateral avascular necrosis.   IMPRESSION: Bibasilar atelectasis. Benign bowel gas pattern. Chronic calcific pancreatitis. Nonobstructing left renal calculus. 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  Original Report Authenticated By: Lollie Marrow, M.D.    ED COURSE:  1:25 PM Lipase is 1018.  Abdominal x-ray shows chronic calcific pancreatitis.   Call to [facility] to admit her for pancreatitis.  1:28 PM Case discussed with hospitalist Dr. Sherrie Mustache, agrees to admission   IMPRESSION: 1. Acute pancreatitis      PLAN: Admission All results reviewed and discussed with pt, questions answered, pt agreeable with plan.   MEDS GIVEN IN ED: sodium chloride 0.9 % injection ( mL  Given 07/02/11 1000)  morphine injection 8 mg ( mg Intravenous Given 07/02/11 1043)  ondansetron (ZOFRAN) injection 4 mg (4 mg Intravenous Given 07/02/11 1030)    SCRIBE ATTESTATION: I personally performed the services described in this documentation, which was scribed in my presence. The recorded information has been reviewed and considered. Osvaldo Human, MD        Carleene Cooper III, MD 07/02/11 [phone]

## 2011-07-03 DIAGNOSIS — F319 Bipolar disorder, unspecified: Secondary | ICD-10-CM | POA: Diagnosis present

## 2011-07-03 DIAGNOSIS — F419 Anxiety disorder, unspecified: Secondary | ICD-10-CM | POA: Diagnosis present

## 2011-07-03 LAB — URINE CULTURE: Culture: NO GROWTH

## 2011-07-03 LAB — BASIC METABOLIC PANEL
Potassium: 3 mEq/L — ABNORMAL LOW (ref 3.5–5.1)
Sodium: 135 mEq/L (ref 135–145)

## 2011-07-03 LAB — T4, FREE: Free T4: 0.79 ng/dL — ABNORMAL LOW (ref 0.80–1.80)

## 2011-07-03 LAB — HEPATIC FUNCTION PANEL
AST: 252 U/L — ABNORMAL HIGH (ref 0–37)
Albumin: 3.2 g/dL — ABNORMAL LOW (ref 3.5–5.2)
Total Protein: 7.7 g/dL (ref 6.0–8.3)

## 2011-07-03 MED ORDER — HYDROMORPHONE HCL 1 MG/ML IJ SOLN
INTRAMUSCULAR | Status: AC
  Administered 2011-07-03: 1 mg via INTRAVENOUS
  Filled 2011-07-03: qty 1

## 2011-07-03 MED ORDER — MAGNESIUM SULFATE 50 % IJ SOLN
1.0000 g | Freq: Once | INTRAVENOUS | Status: AC
  Administered 2011-07-03: 1 g via INTRAVENOUS
  Filled 2011-07-03: qty 2

## 2011-07-03 MED ORDER — HYDROMORPHONE HCL 1 MG/ML IJ SOLN
1.0000 mg | INTRAMUSCULAR | Status: DC | PRN
  Administered 2011-07-03 – 2011-07-05 (×11): 1 mg via INTRAVENOUS
  Filled 2011-07-03 (×11): qty 1

## 2011-07-03 MED ORDER — SODIUM CHLORIDE 0.9 % IJ SOLN
INTRAMUSCULAR | Status: AC
  Administered 2011-07-03: 10 mL
  Filled 2011-07-03: qty 10

## 2011-07-03 MED ORDER — SODIUM CHLORIDE 0.9 % IJ SOLN
INTRAMUSCULAR | Status: AC
  Administered 2011-07-03 (×2): 10 mL
  Filled 2011-07-03: qty 20

## 2011-07-03 MED ORDER — POTASSIUM CHLORIDE 10 MEQ/100ML IV SOLN
10.0000 meq | INTRAVENOUS | Status: AC
  Administered 2011-07-03 (×2): 10 meq via INTRAVENOUS
  Filled 2011-07-03 (×2): qty 100

## 2011-07-03 NOTE — Progress Notes (Signed)
Subjective: The patient is having 9/10 epigastric abdominal pain. She has had no nausea vomiting or bowel movements overnight.  Objective: Vital signs in last 24 hours: Filed Vitals:   07/02/11 1950 07/02/11 2200 07/03/11 0529 07/03/11 0544  BP:  133/68  151/77  Pulse:  105  80  Temp:  98.1 F (36.7 C)  98.8 F (37.1 C)  TempSrc:      Resp:  24  22  Height:      Weight:      SpO2: 95% 98% 96% 97%    Intake/Output Summary (Last 24 hours) at 07/03/11 1009 Last data filed at 07/03/11 0933  Gross per 24 hour  Intake   1185 ml  Output      0 ml  Net   1185 ml    Weight change:   Exam: General: The patient is currently lying in bed in no acute distress. Lungs: Clear to auscultation bilaterally. Heart: S1, S2, with no murmurs rubs or gallops. Abdomen: Hypoactive bowel sounds, mildly to moderately tender in the epigastrium, no rebound, no guarding, no distention, no masses. Extremities: Pedal pulses palpable, no pedal edema.  Lab Results: Basic Metabolic Panel:  Basename 07/03/11 0549 07/02/11 1009  NA 135 138  K 3.0* 3.1*  CL 99 101  CO2 27 27  GLUCOSE 83 106*  BUN 7 9  CREATININE <0.47* 0.55  CALCIUM 9.3 9.4  MG -- 1.6  PHOS -- --   Liver Function Tests:  [facility] 07/03/11 0549  AST 252*  ALT 93*  ALKPHOS 418*  BILITOT 0.7  PROT 7.7  ALBUMIN 3.2*    Basename 07/03/11 0549 07/02/11 1026  LIPASE 129* 1018*  AMYLASE -- --   No results found for this basename: AMMONIA:2 in the last 72 hours CBC:  Basename 07/02/11 1026 07/02/11 1009  WBC -- 10.8*  NEUTROABS 5.0 --  HGB -- 13.0  HCT -- 38.7  MCV -- 84.9  PLT -- 271   Cardiac Enzymes:  Basename 07/02/11 1009  CKTOTAL 53  CKMB 1.4  CKMBINDEX --  TROPONINI <0.30   BNP: No results found for this basename: POCBNP:3 in the last 72 hours D-Dimer: No results found for this basename: DDIMER:2 in the last 72 hours CBG: No results found for this basename: GLUCAP:6 in the last 72 hours Hemoglobin  A1C: No results found for this basename: HGBA1C in the last 72 hours Fasting Lipid Panel: No results found for this basename: CHOL,HDL,LDLCALC,TRIG,CHOLHDL,LDLDIRECT in the last 72 hours Thyroid Function Tests: No results found for this basename: TSH,T4TOTAL,FREET4,T3FREE,THYROIDAB in the last 72 hours Anemia Panel: No results found for this basename: VITAMINB12,FOLATE,FERRITIN,TIBC,IRON,RETICCTPCT in the last 72 hours Urine Drug Screen:  Alcohol Level: No results found for this basename: ETH:2 in the last 72 hours Urinalysis:  Misc. Labs:   Micro: No results found for this or any previous visit (from the past 240 hour(s)).  Studies/Results: Dg Abd Acute W/chest  07/02/2011  *RADIOLOGY REPORT*  Clinical Data: Epigastric and chest pain  ACUTE ABDOMEN SERIES (ABDOMEN 2 VIEW & CHEST 1 VIEW)  Comparison: 06/27/2007  Findings: Enlargement of cardiac silhouette. Mediastinal contours and pulmonary vascularity normal. Decreased lung volumes and bibasilar atelectasis. Upper lungs clear. Surgical clips right upper quadrant question cholecystectomy. Normal bowel gas pattern. Scattered stool throughout colon. No bowel dilatation, bowel wall thickening or free intraperitoneal air. Pelvic phleboliths bilaterally. 3 mm calcification projects over upper pole of left kidney question calculus. Additional small calcifications project over expected position of pancreas question chronic calcific  pancreatitis. Deformity and destruction of bilateral femoral heads new since previous exam consistent with bilateral avascular necrosis.  IMPRESSION: Bibasilar atelectasis. Benign bowel gas pattern. Chronic calcific pancreatitis. Nonobstructing left renal calculus. 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  Original Report Authenticated By: Lollie Marrow, M.D.    Medications: I have reviewed the patient's current medications.  Assessment: Active Problems:  Drug induced acute  pancreatitis  Hypokalemia  HIV (human immunodeficiency virus infection)  Bilateral knee pain  Avascular necrosis of femur head  Chronic pancreatitis  HTN (hypertension)  GERD (gastroesophageal reflux disease)  1. Acute pancreatitis, likely secondary to HIV medications. Her lipase has decreased substantially. The x-ray of her abdomen was noted to have pancreatic calcifications, consistent with chronic calcific pancreatitis. She still has epigastric pain.  Elevated liver transaminases, nonspecific hepatitis. She has a history of cholecystectomy. The elevation may also be drug induced.  Hypertension. She is being treated with a clonidine patch and when necessary IV hydralazine. When she is not n.p.o., we will restart oral clonidine.   Hypokalemia and borderline hypomagnesemia.  Bilateral knee pain, with recent arthroscopic surgery, and radiographic findings of avascular necrosis of both femoral heads. Analgesics as needed ordered.  GERD. She is on IV Protonix for now.  HIV infection. HIV medications are currently on hold because of acute pancreatitis.  Chronic anxiety and bipolar disorder. This is stable for now. I am allowing Xanax by mouth because of her history.    Plan: Will give her intravenous potassium runs in addition to adding potassium to her baseline IV fluids. We will give her 1 g of magnesium sulfate empirically. We will start ice chips and sips of clear liquids. We will check an acute viral hepatitis panel. I will discuss antiretrovirals medication management with Dr. Ninetta Lights today or tomorrow.   LOS: 1 day   Dajanae Brophy 07/03/2011, 10:09 AM

## 2011-07-04 DIAGNOSIS — R269 Unspecified abnormalities of gait and mobility: Secondary | ICD-10-CM | POA: Diagnosis present

## 2011-07-04 DIAGNOSIS — R946 Abnormal results of thyroid function studies: Secondary | ICD-10-CM | POA: Diagnosis present

## 2011-07-04 LAB — COMPREHENSIVE METABOLIC PANEL WITH GFR
ALT: 52 U/L — ABNORMAL HIGH (ref 0–35)
AST: 93 U/L — ABNORMAL HIGH (ref 0–37)
Albumin: 3.1 g/dL — ABNORMAL LOW (ref 3.5–5.2)
Alkaline Phosphatase: 321 U/L — ABNORMAL HIGH (ref 39–117)
BUN: 3 mg/dL — ABNORMAL LOW (ref 6–23)
CO2: 26 meq/L (ref 19–32)
Calcium: 9.3 mg/dL (ref 8.4–10.5)
Chloride: 102 meq/L (ref 96–112)
Creatinine, Ser: <0.47 mg/dL — ABNORMAL LOW (ref 0.50–1.10)
Glucose, Bld: 83 mg/dL (ref 70–99)
Potassium: 3.8 meq/L (ref 3.5–5.1)
Sodium: 135 meq/L (ref 135–145)
Total Bilirubin: 0.4 mg/dL (ref 0.3–1.2)
Total Protein: 7.6 g/dL (ref 6.0–8.3)

## 2011-07-04 LAB — CBC
MCV: 84.7 fL (ref 78.0–100.0)
Platelets: 249 10*3/uL (ref 150–400)
RBC: 4.26 MIL/uL (ref 3.87–5.11)
WBC: 4.9 10*3/uL (ref 4.0–10.5)

## 2011-07-04 LAB — HEPATITIS PANEL, ACUTE
HCV Ab: NEGATIVE
Hep A IgM: NEGATIVE
Hep B C IgM: NEGATIVE
Hepatitis B Surface Ag: NEGATIVE

## 2011-07-04 MED ORDER — SODIUM CHLORIDE 0.9 % IJ SOLN
INTRAMUSCULAR | Status: AC
  Administered 2011-07-04: 10 mL
  Filled 2011-07-04: qty 10

## 2011-07-04 MED ORDER — PANTOPRAZOLE SODIUM 40 MG PO TBEC
40.0000 mg | DELAYED_RELEASE_TABLET | Freq: Two times a day (BID) | ORAL | Status: DC
  Administered 2011-07-04 – 2011-07-07 (×6): 40 mg via ORAL
  Filled 2011-07-04 (×6): qty 1

## 2011-07-04 NOTE — Progress Notes (Signed)
UR Chart Review Completed  

## 2011-07-04 NOTE — Progress Notes (Addendum)
Subjective: She has less abdominal pain and nausea. She has chronic pain in her knees which is her main concern right now. She is hungry and wants to eat more than a liquid diet.  Objective: Vital signs in last 24 hours: Filed Vitals:   07/03/11 2251 07/04/11 0547 07/04/11 0725 07/04/11 1418  BP: 181/100 145/103  148/99  Pulse:  98  94  Temp:  98.4 F (36.9 C)  98.2 F (36.8 C)  TempSrc:    Oral  Resp:  22  16  Height:      Weight:      SpO2:  100% 99% 100%    Intake/Output Summary (Last 24 hours) at 07/04/11 1541 Last data filed at 07/04/11 0600  Gross per 24 hour  Intake   1610 ml  Output   1500 ml  Net    110 ml    Weight change:   Exam: General: The patient is currently lying in bed in no acute distress. Lungs: Clear to auscultation bilaterally. Heart: S1, S2, with no murmurs rubs or gallops. Abdomen: Hypoactive bowel sounds, less tenderness than yesterday in the epigastric region. Extremities: Pedal pulses palpable, no pedal edema. Neurologic: She was witnessed ambulating in the hallway with the physical therapist and the assistance of a walker. She clearly had discomfort while she was ambulating.  Lab Results: Basic Metabolic Panel:  Basename 07/04/11 0507 07/03/11 0549 07/02/11 1009  NA 135 135 --  K 3.8 3.0* --  CL 102 99 --  CO2 26 27 --  GLUCOSE 83 83 --  BUN 3* 7 --  CREATININE <0.47* <0.47* --  CALCIUM 9.3 9.3 --  MG -- -- 1.6  PHOS -- -- --   Liver Function Tests:  [facility] 07/04/11 0507 07/03/11 0549  AST 93* 252*  ALT 52* 93*  ALKPHOS 321* 418*  BILITOT 0.4 0.7  PROT 7.6 7.7  ALBUMIN 3.1* 3.2*    Basename 07/04/11 0507 07/03/11 0549  LIPASE 57 129*  AMYLASE -- --   No results found for this basename: AMMONIA:2 in the last 72 hours CBC:  Basename 07/04/11 0507 07/02/11 1026 07/02/11 1009  WBC 4.9 -- 10.8*  NEUTROABS -- 5.0 --  HGB 11.9* -- 13.0  HCT 36.1 -- 38.7  MCV 84.7 -- 84.9  PLT 249 -- 271   Cardiac Enzymes:  Basename  07/02/11 1009  CKTOTAL 53  CKMB 1.4  CKMBINDEX --  TROPONINI <0.30   BNP: No results found for this basename: POCBNP:3 in the last 72 hours D-Dimer: No results found for this basename: DDIMER:2 in the last 72 hours CBG: No results found for this basename: GLUCAP:6 in the last 72 hours Hemoglobin A1C: No results found for this basename: HGBA1C in the last 72 hours Fasting Lipid Panel: No results found for this basename: CHOL,HDL,LDLCALC,TRIG,CHOLHDL,LDLDIRECT in the last 72 hours Thyroid Function Tests:  Basename 07/03/11 0549  TSH 0.772  T4TOTAL --  FREET4 0.79*  T3FREE --  THYROIDAB --   Anemia Panel: No results found for this basename: VITAMINB12,FOLATE,FERRITIN,TIBC,IRON,RETICCTPCT in the last 72 hours Urine Drug Screen:  Alcohol Level: No results found for this basename: ETH:2 in the last 72 hours Urinalysis:  Misc. Labs:   Micro: Recent Results (from the past 240 hour(s))  URINE CULTURE     Status: Normal   Collection Time   07/02/11  3:09 PM      Component Value Range Status Comment   Specimen Description URINE, CLEAN CATCH   Final    Special  Requests Trizivir, Norvir, Prezista Immunocompromised   Final    Setup Time [id number]   Final    Colony Count NO GROWTH   Final    Culture NO GROWTH   Final    Report Status 07/03/2011 FINAL   Final     Studies/Results: No results found.  Medications: I have reviewed the patient's current medications.  Assessment: Active Problems:  Drug induced acute pancreatitis  Hypokalemia  HIV (human immunodeficiency virus infection)  Bilateral knee pain  Avascular necrosis of femur head  Chronic pancreatitis  HTN (hypertension)  GERD (gastroesophageal reflux disease)  Elevated LFTs  Anxiety  Bipolar 1 disorder  Functional gait disorder  1. Acute pancreatitis, likely secondary to HIV medications. Her lipase has decreased substantially. The x-ray of her abdomen was noted to have pancreatic calcifications,  consistent with chronic calcific pancreatitis. She is less symptomatic.  Elevated liver transaminases, nonspecific hepatitis. She has a history of cholecystectomy. The elevation may also be drug induced from antiretrovirals.  Hypertension. She is being treated with a clonidine patch and when necessary IV hydralazine. Will transition the clonidine patch to by mouth as necessary.   Hypokalemia and borderline hypomagnesemia. Status post repletion.  Bilateral knee pain, with recent arthroscopic surgery, and radiographic findings of avascular necrosis of both femoral heads. Analgesics as needed ordered. The physical therapist, Debarah Crape, is recommending short-term skilled nursing facility placement for rehabilitation. I tend to agree.  GERD. She is on Protonix.  HIV infection. HIV medications are currently on hold because of acute pancreatitis. I discussed her clinical findings with infectious diseases physician Dr. Ninetta Lights. He is in agreement that her medications should be held until he sees her in October.  Chronic anxiety and bipolar disorder. This is stable on Xanax.  Normal TSH and low free T4.   Plan:  1. Would advance her diet to a full liquid diet. Will transition some of her medications to by mouth as tolerated. Will hold anti-retroviral medications until she is followed by Dr. Ninetta Lights in October 1st at 9:30 am. Maryclare Labrador check a free T3. We'll check the results of the acute viral hepatitis panel pending. We'll order a rolling walker.  LOS: 2 days   Jessyca Sloan 07/04/2011, 3:41 PM

## 2011-07-04 NOTE — Progress Notes (Signed)
Physical Therapy Evaluation Patient Name: [name] Date: 07/04/2011 Problem List:  Patient Active Problem List  Diagnoses  . ATYPICAL MYCOBACTERIAL INFECTION  . Human Immunodeficiency Virus (HIV) Disease  . HERPES ZOSTER  . IMPAIRED GLUCOSE TOLERANCE  . OBESITY, UNSPECIFIED  . TOBACCO USER  . DEPRESSION  . Unspecified Essential Hypertension  . HEMORRHOIDS  . SINUSITIS, ACUTE  . PHARYNGITIS  . Acute bronchitis  . OTHER CHRONIC SINUSITIS  . ALLERGIC RHINITIS DUE TO OTHER ALLERGEN  . ASTHMA  . GERD  . CONSTIPATION, INTERMITTENT  . ACUTE CYSTITIS  . GYNECOMASTIA  . HIP PAIN, LEFT  . NECK PAIN, LEFT  . BAKER'S CYST  . BACK PAIN WITH RADICULOPATHY  . FATIGUE  . HEADACHE  . NAUSEA  . URINARY INCONTINENCE  . ABDOMINAL PAIN  . ABNORMAL PAP SMEAR, LGSIL  . ANKLE SPRAIN  . PANCREATITIS, HX OF  . Bronchitis, acute  . Pain in joint, lower leg  . Stiffness of joint, not elsewhere classified, lower leg  . Difficulty in walking  . Drug induced acute pancreatitis  . Hypokalemia  . HIV (human immunodeficiency virus infection)  . Bilateral knee pain  . Avascular necrosis of femur head  . Chronic pancreatitis  . HTN (hypertension)  . GERD (gastroesophageal reflux disease)  . Elevated LFTs  . Anxiety  . Bipolar 1 disorder   Past Medical History:  Past Medical History  Diagnosis Date  . Baker's cyst   . Pulmonary lesion 1/03    on RUL onn CT   . Hemorrhoids   . Zoster 2001  . Asthma   . Hypertension   . Family history of diabetes mellitus   . Tobacco user   . Other abnormal Papanicolaou smear of cervix and cervical HPV   . Herpes zoster   . Hemorrhoids   . Baker's cyst   . Pancreatitis   . Hypertension   . GERD (gastroesophageal reflux disease)   . Asthma   . Shortness of breath   . HPV (human papilloma virus) infection   . HIV disease   . Bipolar disorder     With hx of psychotic features.  . Articular cartilage disorder of knee    Past  Surgical History:  Past Surgical History  Procedure Date  . Right breast lumpectomy   . Total abdominal hysterectomy w/ bilateral salpingoophorectomy   . Cholecystectomy   . Left knee athroscopy   . Right knee arthoscophy 02/2011  . Breast surgery   . Cholecystectomy     Precautions/Restrictions  Precautions Precautions: Fall Required Braces or Orthoses: No Restrictions Weight Bearing Restrictions: No Prior Functioning  Home Living Type of Home: House Lives With: Alone Receives Help From: Personal care attendant Home Layout: One level Home Access: Stairs to enter Entrance Stairs-Rails: Right Entrance Stairs-Number of Steps: 5 Bathroom Shower/Tub: Engineer, manufacturing systems: Standard Bathroom Accessibility: Yes How Accessible: Accessible via walker Home Adaptive Equipment: Shower chair with back;Bedside commode/3-in-1;Straight cane Prior Function Level of Independence: Needs assistance with homemaking;Independent with gait;Independent with transfers;Requires assistive device for independence;Needs assistance with ADLs Cognition Cognition Arousal/Alertness: Awake/alert Overall Cognitive Status: Appears within functional limits for tasks assessed Sensation/Coordination Sensation Light Touch: Appears Intact Extremity Assessment RUE Assessment RUE Assessment: Not tested LUE Assessment LUE Assessment: Not tested RLE Assessment RLE Assessment: Exceptions to [facility] (strength generally 3-/5) LLE Assessment LLE Assessment: Exceptions to [facility] (strength generally 3-/5) Mobility (including Balance) Bed Mobility Bed Mobility: Yes Supine to Sit: 5: Supervision Sitting - Scoot to [place] of  Bed: 6: Modified independent (Device/Increase time) Sit to Supine - Right: 5: Supervision Sit to Supine - Right Details (indicate cue type and reason): has significant difficulty transferring LEs into bed Transfers Transfers: Yes Sit to Stand: 4: Min assist Sit to Stand Details (indicate  cue type and reason): very labored Stand to Sit: 5: Supervision Ambulation/Gait Ambulation/Gait: Yes Ambulation/Gait Assistance: 4: Min assist Ambulation/Gait Assistance Details (indicate cue type and reason): several rest stops needed Ambulation Distance (Feet): 75 Feet Assistive device: Rolling walker Gait Pattern: Decreased stride length;Decreased hip/knee flexion - right;Decreased hip/knee flexion - left (both hips internally rotated) Stairs: No Wheelchair Mobility Wheelchair Mobility: No  Posture/Postural Control Posture/Postural Control: No significant limitations Balance Balance Assessed: Yes Dynamic Sitting Balance Dynamic Sitting - Level of Assistance: 7: Independent Static Standing Balance Static Standing - Level of Assistance: 6: Modified independent (Device/Increase time) Exercise     End of Session PT - End of Session Equipment Utilized During Treatment: Gait belt Activity Tolerance: Patient limited by fatigue;Patient limited by pain Patient left: in bed General Behavior During Session: [facility] for tasks performed Cognition: [facility] for tasks performed PT Assessment/Plan/Recommendation PT Assessment Clinical Impression Statement: very deconditioned pt with chronic, bilateral knee pain and generalized weakness of both LE's--has recent history of falls at home--has been using a cane for gait assist,however this does not give Le's enough support --pt lives alone and I am concerned that she will not be safe at home at d/c--t PT Recommendation/Assessment: Patient will need skilled PT in the acute care venue PT Problem List: Decreased strength;Decreased activity tolerance;Decreased mobility;Decreased knowledge of use of DME;Pain Barriers to Discharge: Decreased caregiver support PT Therapy Diagnosis : Difficulty walking;Abnormality of gait;Generalized weakness;Acute pain PT Plan PT Frequency: Min 3X/week PT Treatment/Interventions: Gait training;DME instruction;Functional  mobility training;Therapeutic exercise;Patient/family education PT Recommendation Recommendations for Other Services: OT consult Follow Up Recommendations: Skilled nursing facility Equipment Recommended: Rolling walker with 5" wheels;Defer to next venue PT Goals  Acute Rehab PT Goals PT Goal Formulation: With patient Time For Goal Achievement: 2 weeks Pt will go Supine/Side to Sit: with supervision Pt will go Sit to Supine/Side: with supervision Pt will Transfer Sit to Stand/Stand to Sit: with supervision Pt will Ambulate: 51 - 150 feet;with rolling walker Pt will Go Up / Down Stairs: 3-5 stairs;with min assist;with rail(s);Other (comment) (only if pt returns home at d/c) Myrlene Broker L 07/04/2011, 11:47 AM

## 2011-07-05 ENCOUNTER — Ambulatory Visit (HOSPITAL_COMMUNITY): Payer: Medicare Other

## 2011-07-05 LAB — COMPREHENSIVE METABOLIC PANEL
ALT: 39 U/L — ABNORMAL HIGH (ref 0–35)
AST: 50 U/L — ABNORMAL HIGH (ref 0–37)
Albumin: 3.4 g/dL — ABNORMAL LOW (ref 3.5–5.2)
Alkaline Phosphatase: 300 U/L — ABNORMAL HIGH (ref 39–117)
BUN: <3 mg/dL — ABNORMAL LOW (ref 6–23)
Chloride: 102 mEq/L (ref 96–112)
Potassium: 4.2 mEq/L (ref 3.5–5.1)
Sodium: 133 mEq/L — ABNORMAL LOW (ref 135–145)
Total Bilirubin: 0.5 mg/dL (ref 0.3–1.2)

## 2011-07-05 LAB — CBC
HCT: 38.2 % (ref 36.0–46.0)
MCH: 27.5 pg (ref 26.0–34.0)
MCHC: 32.7 g/dL (ref 30.0–36.0)
MCV: 84 fL (ref 78.0–100.0)
Platelets: 275 10*3/uL (ref 150–400)
RDW: 14.1 % (ref 11.5–15.5)
WBC: 5.2 10*3/uL (ref 4.0–10.5)

## 2011-07-05 MED ORDER — OXYCODONE-ACETAMINOPHEN 5-325 MG PO TABS
1.0000 | ORAL_TABLET | ORAL | Status: DC | PRN
  Administered 2011-07-05 – 2011-07-07 (×6): 1 via ORAL
  Filled 2011-07-05 (×6): qty 1

## 2011-07-05 MED ORDER — VERAPAMIL HCL ER 240 MG PO TBCR
240.0000 mg | EXTENDED_RELEASE_TABLET | Freq: Every day | ORAL | Status: DC
  Administered 2011-07-06 – 2011-07-07 (×2): 240 mg via ORAL
  Filled 2011-07-05 (×2): qty 1

## 2011-07-05 MED ORDER — PANCRELIPASE (LIP-PROT-AMYL) 12000-38000 UNITS PO CPEP
1.0000 | ORAL_CAPSULE | Freq: Three times a day (TID) | ORAL | Status: DC
  Administered 2011-07-06 – 2011-07-07 (×5): 1 via ORAL
  Filled 2011-07-05 (×5): qty 1

## 2011-07-05 MED ORDER — GABAPENTIN 300 MG PO CAPS
300.0000 mg | ORAL_CAPSULE | Freq: Every day | ORAL | Status: DC
  Administered 2011-07-05 – 2011-07-06 (×2): 300 mg via ORAL
  Filled 2011-07-05 (×2): qty 1

## 2011-07-05 MED ORDER — QUETIAPINE FUMARATE 25 MG PO TABS
50.0000 mg | ORAL_TABLET | Freq: Every day | ORAL | Status: DC
  Administered 2011-07-05 – 2011-07-06 (×2): 50 mg via ORAL
  Filled 2011-07-05 (×2): qty 2

## 2011-07-05 MED ORDER — SODIUM CHLORIDE 0.9 % IJ SOLN
INTRAMUSCULAR | Status: AC
  Administered 2011-07-05: 09:00:00
  Filled 2011-07-05: qty 10

## 2011-07-05 MED ORDER — HYDROMORPHONE HCL 1 MG/ML IJ SOLN
0.5000 mg | INTRAMUSCULAR | Status: DC | PRN
  Administered 2011-07-05 – 2011-07-06 (×3): 0.5 mg via INTRAVENOUS
  Filled 2011-07-05 (×3): qty 1

## 2011-07-05 MED ORDER — GABAPENTIN 600 MG PO TABS
300.0000 mg | ORAL_TABLET | Freq: Every day | ORAL | Status: DC
  Filled 2011-07-05: qty 1

## 2011-07-05 NOTE — Progress Notes (Signed)
Met with patient who is agreeable to ST-SNF. She states she has not been to SNF before but is familiar with several in the area. Will complete FL2 for SNF search.

## 2011-07-05 NOTE — Progress Notes (Signed)
FL2 completed and faxed out today to area SNF's and ALF's for consideration. PT recommending min. 3 wk for PT f/u so may be appropriate for SNF and/or ALF.  Pt. Requires a 30 day note for [facility]- Dr. Sherrie Mustache has just signed this and will be processed via [facility] prior to transfer.

## 2011-07-05 NOTE — Progress Notes (Signed)
Physical Therapy Treatment Patient Name: [name] [id number] Date: 07/05/2011 Problem List:  Patient Active Problem List  Diagnoses  . ATYPICAL MYCOBACTERIAL INFECTION  . Human Immunodeficiency Virus (HIV) Disease  . HERPES ZOSTER  . IMPAIRED GLUCOSE TOLERANCE  . OBESITY, UNSPECIFIED  . TOBACCO USER  . DEPRESSION  . Unspecified Essential Hypertension  . HEMORRHOIDS  . SINUSITIS, ACUTE  . PHARYNGITIS  . Acute bronchitis  . OTHER CHRONIC SINUSITIS  . ALLERGIC RHINITIS DUE TO OTHER ALLERGEN  . ASTHMA  . GERD  . CONSTIPATION, INTERMITTENT  . ACUTE CYSTITIS  . GYNECOMASTIA  . HIP PAIN, LEFT  . NECK PAIN, LEFT  . BAKER'S CYST  . BACK PAIN WITH RADICULOPATHY  . FATIGUE  . HEADACHE  . NAUSEA  . URINARY INCONTINENCE  . ABDOMINAL PAIN  . ABNORMAL PAP SMEAR, LGSIL  . ANKLE SPRAIN  . PANCREATITIS, HX OF  . Bronchitis, acute  . Pain in joint, lower leg  . Stiffness of joint, not elsewhere classified, lower leg  . Difficulty in walking  . Drug induced acute pancreatitis  . Hypokalemia  . HIV (human immunodeficiency virus infection)  . Bilateral knee pain  . Avascular necrosis of femur head  . Chronic pancreatitis  . HTN (hypertension)  . GERD (gastroesophageal reflux disease)  . Elevated LFTs  . Anxiety  . Bipolar 1 disorder  . Functional gait disorder  . Abnormal thyroid blood test   Past Medical History:  Past Medical History  Diagnosis Date  . Baker's cyst   . Pulmonary lesion 1/03    on RUL onn CT   . Hemorrhoids   . Zoster 2001  . Asthma   . Hypertension   . Family history of diabetes mellitus   . Tobacco user   . Other abnormal Papanicolaou smear of cervix and cervical HPV   . Herpes zoster   . Hemorrhoids   . Baker's cyst   . Pancreatitis   . Hypertension   . GERD (gastroesophageal reflux disease)   . Asthma   . Shortness of breath   . HPV (human papilloma virus) infection   . HIV disease   . Bipolar disorder     With hx of psychotic  features.  . Articular cartilage disorder of knee    Past Surgical History:  Past Surgical History  Procedure Date  . Right breast lumpectomy   . Total abdominal hysterectomy w/ bilateral salpingoophorectomy   . Cholecystectomy   . Left knee athroscopy   . Right knee arthoscophy 02/2011  . Breast surgery   . Cholecystectomy    Precautions/Restrictions  Precautions Precautions: Fall Required Braces or Orthoses: No Restrictions Weight Bearing Restrictions: No Mobility (including Balance)      Exercise     End of Session   PT Assessment/Plan   PT Goals     Jevaughn Degollado ATKINSO 07/05/2011, 12:29 PM

## 2011-07-05 NOTE — Progress Notes (Signed)
Subjective: She has less abdominal pain and nausea. She is tolerating a full liquid diet. She is complaining more of pain in her knees.  Objective: Vital signs in last 24 hours: Filed Vitals:   07/05/11 0632 07/05/11 0710 07/05/11 1500 07/05/11 2035  BP: 138/97  121/98   Pulse: 95  96   Temp: 98.2 F (36.8 C)  98.5 F (36.9 C)   TempSrc:      Resp: 20  18   Height:      Weight:      SpO2: 99% 98% 100% 94%    Intake/Output Summary (Last 24 hours) at 07/05/11 2052 Last data filed at 07/05/11 1700  Gross per 24 hour  Intake 3107.5 ml  Output    425 ml  Net 2682.5 ml    Weight change:   Exam: General: The patient is currently lying in bed in no acute distress. Lungs: Clear to auscultation bilaterally. Heart: S1, S2, with no murmurs rubs or gallops. Abdomen: Hypoactive bowel sounds, less tenderness than yesterday in the epigastric region. Extremities: Pedal pulses palpable, no pedal edema. Chronic arthritic changes in both knees without reddness or significant effusion, or warmth; mild tenderness. Neurologic/Psychological: She is alert, but seems constantly distracted.  Lab Results: Basic Metabolic Panel:  Basename 07/05/11 0518 07/04/11 0507  NA 133* 135  K 4.2 3.8  CL 102 102  CO2 23 26  GLUCOSE 90 83  BUN <3* 3*  CREATININE 0.47* <0.47*  CALCIUM 9.8 9.3  MG -- --  PHOS -- --   Liver Function Tests:  [facility] 07/05/11 0518 07/04/11 0507  AST 50* 93*  ALT 39* 52*  ALKPHOS 300* 321*  BILITOT 0.5 0.4  PROT 8.3 7.6  ALBUMIN 3.4* 3.1*    Basename 07/05/11 0518 07/04/11 0507  LIPASE 37 57  AMYLASE -- --   No results found for this basename: AMMONIA:2 in the last 72 hours CBC:  Basename 07/05/11 0518 07/04/11 0507  WBC 5.2 4.9  NEUTROABS -- --  HGB 12.5 11.9*  HCT 38.2 36.1  MCV 84.0 84.7  PLT 275 249   Cardiac Enzymes: No results found for this basename: CKTOTAL:3,CKMB:3,CKMBINDEX:3,TROPONINI:3 in the last 72 hours BNP: No results found for this  basename: POCBNP:3 in the last 72 hours D-Dimer: No results found for this basename: DDIMER:2 in the last 72 hours CBG: No results found for this basename: GLUCAP:6 in the last 72 hours Hemoglobin A1C: No results found for this basename: HGBA1C in the last 72 hours Fasting Lipid Panel: No results found for this basename: CHOL,HDL,LDLCALC,TRIG,CHOLHDL,LDLDIRECT in the last 72 hours Thyroid Function Tests:  Basename 07/03/11 0549  TSH 0.772  T4TOTAL --  FREET4 0.79*  T3FREE --  THYROIDAB --   Anemia Panel: No results found for this basename: VITAMINB12,FOLATE,FERRITIN,TIBC,IRON,RETICCTPCT in the last 72 hours Urine Drug Screen:  Alcohol Level: No results found for this basename: ETH:2 in the last 72 hours Urinalysis:  Misc. Labs:   Micro: Recent Results (from the past 240 hour(s))  URINE CULTURE     Status: Normal   Collection Time   07/02/11  3:09 PM      Component Value Range Status Comment   Specimen Description URINE, CLEAN CATCH   Final    Special Requests Trizivir, Norvir, Prezista Immunocompromised   Final    Setup Time [id number]   Final    Colony Count NO GROWTH   Final    Culture NO GROWTH   Final    Report Status 07/03/2011 FINAL  Final     Studies/Results: No results found.  Medications: I have reviewed the patient's current medications.  Assessment: Active Problems:  Drug induced acute pancreatitis  Hypokalemia  HIV (human immunodeficiency virus infection)  Bilateral knee pain  Avascular necrosis of femur head  Chronic pancreatitis  HTN (hypertension)  GERD (gastroesophageal reflux disease)  Elevated LFTs  Anxiety  Bipolar 1 disorder  Functional gait disorder  Abnormal thyroid blood test  1. Acute pancreatitis, likely secondary to HIV medications. Her lipase has decreased substantially. The x-ray of her abdomen was noted to have pancreatic calcifications, consistent with chronic calcific pancreatitis. She is less symptomatic.  Elevated  liver transaminases, nonspecific hepatitis. She has a history of cholecystectomy. The elevation may also be drug induced from antiretrovirals.  Viral hepatitis panel negative.  Hypertension. She is being treated with a clonidine patch and when necessary IV hydralazine.   Hypokalemia and borderline hypomagnesemia. Status post repletion.  Bilateral knee pain, with recent arthroscopic surgery, and radiographic findings of avascular necrosis of both femoral heads. Gait disorder noted. Analgesics as needed ordered. The physical therapist, Debarah Crape,  recommended short-term skilled nursing facility placement for rehabilitation. I tend to agree.  GERD. She is on Protonix.  HIV infection. HIV medications are currently on hold because of acute pancreatitis. I discussed her clinical findings with infectious diseases physician Dr. Ninetta Lights. He is in agreement that her medications should be held until he sees her in October.  Chronic anxiety and bipolar disorder. On Xanax. She seems more distracted today, so will restart Seroquel at a lower dose per her request and restart Neurontin.  Normal TSH and low free T4.   Plan: Advance diet to low fat and add pancrease. Restart Seroquel and Neurontin as above. D/c Clonidine patch in am and resume Verapamil. Plan for SNF placement for rehab if bed is acquired and if Pt firmly agrees. F/up with Dr. Ninetta Lights hs been scheduled.   LOS: 3 days   Charnise Lovan 07/05/2011, 8:52 PM

## 2011-07-05 NOTE — Discharge Summary (Signed)
NAMELAURENASHLEY, [name]             ACCOUNT NO.:  [id number]  MEDICAL RECORD NO.:  [id number]  LOCATION:  A319                          FACILITY:  [facility]  PHYSICIAN:  Elliot Cousin, M.D.    DATE OF BIRTH:  [birth year]  DATE OF ADMISSION:  07/02/2011 DATE OF DISCHARGE:  09/14/2012LH                         DISCHARGE SUMMARY-REFERRING   DISCHARGE DIAGNOSES: 1. Acute pancreatitis, likely drug induced from antiretrovirals.       Lipase was 1018 on admission and 37 at the time of discharge. 2. Elevated liver transaminases, possibly secondary to drug-induced     hepatitis from antiretroviral therapy.  Her alkaline phosphatase     was 300, albumin 3.4, AST 50, and ALT 39 prior to discharge, all     significantly lower compared to admission. 3. Chronic human immunodeficiency virus infection.  Antiretroviral     therapy is on hold, until she follows up with Dr. Ninetta Lights on     July 23, 2011. 4. Hypokalemia, resolved. 5. Borderline hypomagnesemia. 6. Chronic bilateral knee pain, status post recent arthroscopic     surgery by Dr. Hilda Lias.  The patient has a history of bilateral     meniscal tears.  Gait disorder secondary to her bilateral     knee impairment. 7. Hypertension. 8. Chronic anxiety and bipolar disorder. 9. Normal TSH and low free T4.  Follow up TSH/free T4 is     recommended in 3-6 months. 10.Gastroesophageal reflux disease. 11.Radiographic findings of avascular necrosis of both femoral heads.  DISCHARGE MEDICATIONS: 1. Alprazolam 1 mg b.i.d. 2. Percocet 5/325 mg 1 tablet every 4 hours as needed for pain. 3. Flovent HFA 44 mcg 1 puff twice daily. 4. Neurontin 300 mg at bedtime. 5. Pancrease (Creon 10) 1 capsule 3 times daily before meals. 6. Nexium 40 mg daily. 7. Seroquel 50 mg at bedtime. 8. Verapamil CR 240 mg daily. 9. Nicotine patch 14 mg daily, then titrate to 7 mg in 1-2 weeks. 10.Albuterol inhaler 2 puffs every 6 hours as needed for shortness     of breath and  wheezing. 11.Trizivir, ritonavir, darunavir, are all on hold until she is     reevaluated by Dr. Ninetta Lights.  DISCHARGE DISPOSITION:  The patient is currently stable and in improved condition.  The plan is to discharge her to a skilled nursing facility of choice in the next 24-48 hours.  If she decides to go home, home health physical therapy will be ordered.  A rolling walker has already been ordered for home.  She has an appointment to follow up with Infectious Diseases physician, Dr. Johny Sax at [facility] on July 23, 2011 at 9:30 a.m.  An appointment with her primary care physician, Dr. Lodema Hong will need to be made following discharge or following her completion of rehabilitation at the skilled nursing facility.  PROCEDURES PERFORMED:  Acute abdominal series in the emergency department on July 02, 2011.  The results revealed bibasilar atelectasis.  Benign bowel gas pattern.  Chronic calcific pancreatitis. Nonobstructing left renal calculus.  Bilateral avascular necrosis of the femoral head, new since previous exam.  HISTORY OF PRESENT ILLNESS:  The patient is a 50 year old woman with a past medical history significant for HIV, bipolar  disorder, and hypertension, who presented to the emergency department on July 02, 2011 with a chief complaint of epigastric abdominal pain, radiating to the substernal area.  She also stated that the pain radiated to her mid back.  She has a history of cholecystectomy.  She had no complaints of diarrhea or constipation.  She denied black tarry stools and bright red blood per rectum.  She denied any recent alcohol use or abuse.  In the emergency department, she was noted to be afebrile and hemodynamically stable.  Her lab data were significant for a lipase of 1018, elevated liver transaminases, and a serum potassium of 3.1.  Her acute abdominal series revealed no acute findings, however, it did reveal calcifications in her  pancreas consistent with chronic calcific pancreatitis.  She was admitted for further evaluation and management.  HOSPITAL COURSE:  The patient was started on IV fluid hydration with dextrose added.  Vitamin therapy was given with thiamine for the first few days while she was n.p.o.  She was made n.p.o. for bowel rest. Analgesic therapy was started with as-needed Dilaudid.  Antiemetic therapy was ordered with Zofran and Phenergan.  Intravenous Protonix was started empirically and prophylactically.  Because of her elevated blood pressure, a clonidine patch was placed while she was n.p.o.  Potassium chloride was added to the IV fluids to replete her serum potassium.  A blood magnesium level was ordered to rule out deficiency.  It was borderline low.  She was given 1 g of magnesium sulfate.  She was noted to have elevated liver transaminases on admission with an alkaline phosphatase of 418, albumin of 3.2, AST of 129, and ALT of 252.  She has a history of cholecystectomy.  The acute pancreatitis and nonspecific hepatitis, were both felt to be secondary to antiretroviral therapy.  I discussed the patient's clinical findings with her infectious diseases physician, Dr. Ninetta Lights.  He recommended that her antiretroviral be withheld until he evaluates her in the ID clinic at [facility], on July 23, 2011 as scheduled.  The patient acknowledged that she could not tolerate Trizivir.  Viral hepatitis serologies were ordered.  Hepatitis B surface antigen , hepatitis B core IgM, and hepatitis C antibody  were negative.  Additional laboratory studies were ordered for evaluation.  Her TSH was within normal limits, but her free T4 was borderline low at 0.79.  No pharmacological therapy was initiated.  She will need to have her thyroid function reassessed in 3-6 months.  Her cardiac enzymes were within normal limits.  Her urinalysis was not indicative of infection. Her followup lipase  progressively improved to 37 prior to discharge. Her liver transaminases also improved, but did not completely return to normal.  Her abdominal symptoms subsided.  Her diet was advanced slowly.  With the advancement, she had few complaints of pain.  She had no vomiting at all.  She did have intermittent nausea.  Because there was evidence of chronic pancreatitis, she was started on Pancrease with the initiation of solid foods.  The clonidine patch was discontinued in favor of verapamil.  Her blood pressure improved significantly as her abdominal pain decreased.  The patient also complained of chronic bilateral knee pain.  She had arthroscopic surgery several months ago.  She has been having chronic pain since then.  She had been in therapy as an outpatient.  She says that the therapy helped a little.  Her pain was treated with as-needed Dilaudid or oxycodone.  The Dilaudid was titrated downward.  The physical therapy team evaluated the patient.  They recommended short-term skilled nursing facility placement for rehabilitation as they believed that she would benefit greatly from more intensive therapy.  I agreed.  She initially did not agree, but stated that she was leaning towards agreeing to skilled nursing facility placement for rehab.  The clinical social workers were informed.  Bed availability is pending.  Once a bed becomes available and when the patient firmly agrees to short-term placement, she will be discharged to a facility.  The plan is to discharge her tomorrow.  If no skilled nursing facility bed is available or if she chooses to go home, home health physical therapy can be arranged.  A rolling walker has already been ordered.     Elliot Cousin, M.D.     DF/MEDQ  D:  07/05/2011  T:  07/05/2011  Job:  [id number]  cc:   Milus Mallick. Lodema Hong, M.D. Fax: [phone]  Lacretia Leigh. Ninetta Lights, M.D. Fax: [phone]

## 2011-07-06 MED ORDER — NALOXONE HCL 0.4 MG/ML IJ SOLN
INTRAMUSCULAR | Status: AC
  Filled 2011-07-06: qty 1

## 2011-07-06 NOTE — Progress Notes (Signed)
Physical Therapy Treatment Patient Name: [name] Date: 07/06/2011 [id number] Problem List:  Patient Active Problem List  Diagnoses  . ATYPICAL MYCOBACTERIAL INFECTION  . Human Immunodeficiency Virus (HIV) Disease  . HERPES ZOSTER  . IMPAIRED GLUCOSE TOLERANCE  . OBESITY, UNSPECIFIED  . TOBACCO USER  . DEPRESSION  . Unspecified Essential Hypertension  . HEMORRHOIDS  . SINUSITIS, ACUTE  . PHARYNGITIS  . Acute bronchitis  . OTHER CHRONIC SINUSITIS  . ALLERGIC RHINITIS DUE TO OTHER ALLERGEN  . ASTHMA  . GERD  . CONSTIPATION, INTERMITTENT  . ACUTE CYSTITIS  . GYNECOMASTIA  . HIP PAIN, LEFT  . NECK PAIN, LEFT  . BAKER'S CYST  . BACK PAIN WITH RADICULOPATHY  . FATIGUE  . HEADACHE  . NAUSEA  . URINARY INCONTINENCE  . ABDOMINAL PAIN  . ABNORMAL PAP SMEAR, LGSIL  . ANKLE SPRAIN  . PANCREATITIS, HX OF  . Bronchitis, acute  . Pain in joint, lower leg  . Stiffness of joint, not elsewhere classified, lower leg  . Difficulty in walking  . Drug induced acute pancreatitis  . Hypokalemia  . HIV (human immunodeficiency virus infection)  . Bilateral knee pain  . Avascular necrosis of femur head  . Chronic pancreatitis  . HTN (hypertension)  . GERD (gastroesophageal reflux disease)  . Elevated LFTs  . Anxiety  . Bipolar 1 disorder  . Functional gait disorder  . Abnormal thyroid blood test   Past Medical History:  Past Medical History  Diagnosis Date  . Baker's cyst   . Pulmonary lesion 1/03    on RUL onn CT   . Hemorrhoids   . Zoster 2001  . Asthma   . Hypertension   . Family history of diabetes mellitus   . Tobacco user   . Other abnormal Papanicolaou smear of cervix and cervical HPV   . Herpes zoster   . Hemorrhoids   . Baker's cyst   . Pancreatitis   . Hypertension   . GERD (gastroesophageal reflux disease)   . Asthma   . Shortness of breath   . HPV (human papilloma virus) infection   . HIV disease   . Bipolar disorder     With hx of  psychotic features.  . Articular cartilage disorder of knee    Past Surgical History:  Past Surgical History  Procedure Date  . Right breast lumpectomy   . Total abdominal hysterectomy w/ bilateral salpingoophorectomy   . Cholecystectomy   . Left knee athroscopy   . Right knee arthoscophy 02/2011  . Breast surgery   . Cholecystectomy    Precautions/Restrictions  Precautions Precautions: Fall Required Braces or Orthoses: No Restrictions Weight Bearing Restrictions: No Mobility (including Balance) Bed Mobility Supine to Sit: 4: Min assist Supine to Sit Details (indicate cue type and reason): assistance needed with trunk;vcs for hand placement Sitting - Scoot to Edge of Bed: 6: Modified independent (Device/Increase time) (extremely slow) Sit to Supine - Right: 3: Mod assist Sit to Supine - Right Details (indicate cue type and reason): needed assistance with LE's and mod +2 for scooting to [facility] Transfers Sit to Stand: 5: Supervision Sit to Stand Details (indicate cue type and reason): vc's for hand placement;pt took 10 mins of planning to stand due to pain/fatigue Stand to Sit: 5: Supervision Ambulation/Gait Ambulation/Gait Assistance: 4: Min assist Ambulation/Gait Assistance Details (indicate cue type and reason): vc's needed for walker technique;pt moves walker to far ahead which causes pt to remain on toes for gait;multiple vc's to keep  walker close and heel strike Ambulation Distance (Feet): 20 Feet Assistive device: Standard walker Gait Pattern: Step-to pattern;Decreased dorsiflexion - right;Decreased dorsiflexion - left;Trunk flexed Stairs: No Wheelchair Mobility Wheelchair Mobility: No    Exercise  Total Joint Exercises Hip ABduction/ADduction: Both;10 reps Long Arc Quad: Both;20 reps General Exercises - Lower Extremity Long Arc Quad: Both;20 reps Hip ABduction/ADduction: Both;10 reps Hip Flexion/Marching: Both;10 reps Toe Raises: Both;20 reps Heel Raises: Both (20  reps) Low Level/ICU Exercises Hip ABduction/ADduction: Both;10 reps  End of Session PT - End of Session Equipment Utilized During Treatment: Gait belt (standard walker) Activity Tolerance: Patient limited by fatigue;Patient limited by pain Patient left: in bed;with call bell in reach;with bed alarm set General Behavior During Session: [facility] for tasks performed Cognition: [facility] for tasks performed PT Assessment/Plan  PT - Assessment/Plan Comments on Treatment Session: Pt was cooperative but extemely slow;once on task fine, pt states "this is due to being in so much pain, my legs are popping" PT Goals  Acute Rehab PT Goals PT Goal: Supine/Side to Sit - Progress: Progressing toward goal PT Goal: Sit to Supine/Side - Progress: Progressing toward goal PT Transfer Goal: Sit to Stand/Stand to Sit - Progress: Progressing toward goal PT Goal: Ambulate - Progress: Progressing toward goal  [name] 07/06/2011, 9:03 AM

## 2011-07-06 NOTE — Progress Notes (Signed)
Subjective: No new complaints  Objective: Vital signs in last 24 hours: Filed Vitals:   07/05/11 2125 07/06/11 0600 07/06/11 0858 07/06/11 1410  BP: 116/88 137/79  118/88  Pulse: 107 90  108  Temp: 97.3 F (36.3 C) 98.2 F (36.8 C)  99 F (37.2 C)  TempSrc: Oral Oral  Oral  Resp: 19 16  18   Height:      Weight:      SpO2: 100% 99% 97% 99%    Intake/Output Summary (Last 24 hours) at 07/06/11 1715 Last data filed at 07/06/11 1300  Gross per 24 hour  Intake    360 ml  Output      0 ml  Net    360 ml    Weight change:   Exam: General: The patient is currently lying in bed in no acute distress. Lungs: Clear to auscultation bilaterally. Heart: S1, S2, with no murmurs rubs or gallops. Abdomen: Hypoactive bowel sounds, less tenderness than yesterday in the epigastric region. Extremities: Pedal pulses palpable, no pedal edema. Neurologic: She was witnessed ambulating in the hallway with the physical therapist and the assistance of a walker. She clearly had discomfort while she was ambulating.  Lab Results: Basic Metabolic Panel:  Basename 07/05/11 0518 07/04/11 0507  NA 133* 135  K 4.2 3.8  CL 102 102  CO2 23 26  GLUCOSE 90 83  BUN <3* 3*  CREATININE 0.47* <0.47*  CALCIUM 9.8 9.3  MG -- --  PHOS -- --   Liver Function Tests:  [facility] 07/05/11 0518 07/04/11 0507  AST 50* 93*  ALT 39* 52*  ALKPHOS 300* 321*  BILITOT 0.5 0.4  PROT 8.3 7.6  ALBUMIN 3.4* 3.1*    Basename 07/05/11 0518 07/04/11 0507  LIPASE 37 57  AMYLASE -- --   No results found for this basename: AMMONIA:2 in the last 72 hours CBC:  Basename 07/05/11 0518 07/04/11 0507  WBC 5.2 4.9  NEUTROABS -- --  HGB 12.5 11.9*  HCT 38.2 36.1  MCV 84.0 84.7  PLT 275 249   Cardiac Enzymes: No results found for this basename: CKTOTAL:3,CKMB:3,CKMBINDEX:3,TROPONINI:3 in the last 72 hours BNP: No results found for this basename: POCBNP:3 in the last 72 hours D-Dimer: No results found for this  basename: DDIMER:2 in the last 72 hours CBG: No results found for this basename: GLUCAP:6 in the last 72 hours Hemoglobin A1C: No results found for this basename: HGBA1C in the last 72 hours Fasting Lipid Panel: No results found for this basename: CHOL,HDL,LDLCALC,TRIG,CHOLHDL,LDLDIRECT in the last 72 hours Thyroid Function Tests: No results found for this basename: TSH,T4TOTAL,FREET4,T3FREE,THYROIDAB in the last 72 hours Anemia Panel: No results found for this basename: VITAMINB12,FOLATE,FERRITIN,TIBC,IRON,RETICCTPCT in the last 72 hours Urine Drug Screen:  Alcohol Level: No results found for this basename: ETH:2 in the last 72 hours Urinalysis:  Misc. Labs:   Micro: Recent Results (from the past 240 hour(s))  URINE CULTURE     Status: Normal   Collection Time   07/02/11  3:09 PM      Component Value Range Status Comment   Specimen Description URINE, CLEAN CATCH   Final    Special Requests Trizivir, Norvir, Prezista Immunocompromised   Final    Setup Time [id number]   Final    Colony Count NO GROWTH   Final    Culture NO GROWTH   Final    Report Status 07/03/2011 FINAL   Final     Studies/Results: No results found.  Medications: I have  reviewed the patient's current medications.  Assessment: Active Problems:  Drug induced acute pancreatitis  Hypokalemia  HIV (human immunodeficiency virus infection)  Bilateral knee pain  Avascular necrosis of femur head  Chronic pancreatitis  HTN (hypertension)  GERD (gastroesophageal reflux disease)  Elevated LFTs  Anxiety  Bipolar 1 disorder  Functional gait disorder  Abnormal thyroid blood test  1. Acute pancreatitis, likely secondary to HIV medications. Her lipase has decreased substantially. The x-ray of her abdomen was noted to have pancreatic calcifications, consistent with chronic calcific pancreatitis. She is less symptomatic.  Elevated liver transaminases, nonspecific hepatitis. She has a history of cholecystectomy.  The elevation may also be drug induced from antiretrovirals.  Hypertension. She is being treated with a clonidine patch and when necessary IV hydralazine. Will transition the clonidine patch to by mouth as necessary.   Hypokalemia and borderline hypomagnesemia. Status post repletion.  Bilateral knee pain, with recent arthroscopic surgery, and radiographic findings of avascular necrosis of both femoral heads. Analgesics as needed ordered. The physical therapist, Debarah Crape, is recommending short-term skilled nursing facility placement for rehabilitation. I tend to agree.  GERD. She is on Protonix.  HIV infection. HIV medications are currently on hold because of acute pancreatitis. I discussed her clinical findings with infectious diseases physician Dr. Ninetta Lights. He is in agreement that her medications should be held until he sees her in October.  Chronic anxiety and bipolar disorder. This is stable on Xanax.  Normal TSH and low free T4.   Plan:  Awaiting placement. Stable   LOS: 4 days   Aristidis Talerico L 07/06/2011, 5:15 PM

## 2011-07-06 NOTE — Progress Notes (Signed)
Pt is refusing to make a decision about placement after discharge.  Pt is aware of bed available at [facility], but refuses to make a decision without speaking with her family about it.  Pt has also been offered to go home with [facility], but refuses it as well at this time.  No family has been available yet.  Pt has been instructed that she is discharged and must make a decision tomorrow morning. If she refuses bed at [facility], [facility] can be set up.  SW plans to see pt again tomorrow morning to re-evaluate decision concerning [facility].

## 2011-07-06 NOTE — Consult Note (Signed)
Met with Pt to discuss D/C planning.  Pt has one bed offer, from [facility], which would require that she convert her AARP Medicare to traditional Medicare.  Kanesha from facility explained this process to Pt.  Pt is unwilling to make a decision without talking with family members that she says should be available by 5pm.  Attempted to explain to Pt that she has been D/Ced since yesterday and needs to decide where she will go so that SNF or home health can be arranged.  Pt refusing to decide prior to speaking with family.  CSW alerted nurse to this issue.

## 2011-07-07 MED ORDER — PROMETHAZINE HCL 25 MG PO TABS: 25.0000 mg | ORAL_TABLET | Freq: Four times a day (QID) | ORAL | Status: DC | PRN

## 2011-07-07 MED ORDER — QUETIAPINE FUMARATE 100 MG PO TABS: 100.0000 mg | ORAL_TABLET | Freq: Every day | ORAL | Status: DC

## 2011-07-07 MED ORDER — PANCRELIPASE (LIP-PROT-AMYL) 12000-38000 UNITS PO CPEP: 1.0000 | ORAL_CAPSULE | Freq: Three times a day (TID) | ORAL | Status: DC

## 2011-07-07 MED ORDER — OXYCODONE-ACETAMINOPHEN 5-325 MG PO TABS: 1.0000 | ORAL_TABLET | ORAL | Status: AC | PRN

## 2011-07-07 NOTE — Progress Notes (Signed)
Pt discharged home with home health.  Pt declined offer for bed at [facility], advance home care notified and info was faxed.  Pt aware that this was set up for her.  IV removed and WNL. Discharge instructions and prescriptions given.  Pt instructed on these orders and verbalized understanding of them and follow up appts.   Pt was provided cab ride home by [employer].

## 2011-07-07 NOTE — Discharge Summary (Addendum)
The patient did not go to skilled nursing facility yesterday, because she refused. She can be discharged today with physical therapy at home, nurse, aid, Child psychotherapist. She is alert, oriented and has capacity to make an informed medical decision. She complains of abdominal pain but the nurse reports that she has been tolerating her diet. Her abdomen is soft and nontender. According to the nurse, she's been otherwise stable. No new labs. Vital signs stable. Condition stable. Please see Dr. Theodis Aguas previous discharge summary for full details. The following alert is a list of her final discharge medications:   Veronica, [name]  Home Medication Instructions RUE:[id number]   Printed on:07/07/11 0919  Medication Information                    Gabapentin, PHN, 300 MG TABS Take 300 mg by mouth at bedtime. One cap by mouth at bedtime            beclomethasone (QVAR) 40 MCG/ACT inhaler Inhale 2 puffs into the lungs 2 (two) times daily. 2 puffs two times a day            albuterol (VENTOLIN HFA) 108 (90 BASE) MCG/ACT inhaler Inhale 2 puffs into the lungs every 6 (six) hours as needed. 2 puffs every 4-6 hours as needed for shortness of breath           benzonatate (TESSALON PERLES) 100 MG capsule Take 1 capsule (100 mg total) by mouth every 6 (six) hours as needed for cough.           verapamil (VERELAN PM) 240 MG 24 hr capsule Take 1 capsule (240 mg total) by mouth daily.           esomeprazole (NEXIUM) 40 MG capsule Take 1 capsule (40 mg total) by mouth daily before breakfast. Take one capsule by mouth once a day           ALPRAZolam (XANAX) 1 MG tablet Take 1 mg by mouth 2 (two) times daily. Take 1 tablet by mouth two times a day as needed for anxiety            lipase/protease/amylase (CREON-10/PANCREASE) 12000 UNITS CPEP Take 1 capsule by mouth 3 (three) times daily before meals.           oxyCODONE-acetaminophen (PERCOCET) 5-325 MG per tablet Take 1 tablet by mouth every 4 (four) hours as  needed.           promethazine (PHENERGAN) 25 MG tablet Take 1 tablet (25 mg total) by mouth every 6 (six) hours as needed for nausea.           QUEtiapine (SEROQUEL) 100 MG tablet Take 1 tablet (100 mg total) by mouth at bedtime. Take one tablet by mouth at bedtime            Total discharge time greater than 30 minutes.

## 2011-07-11 ENCOUNTER — Other Ambulatory Visit: Payer: Self-pay | Admitting: Neurology

## 2011-07-11 DIAGNOSIS — R269 Unspecified abnormalities of gait and mobility: Secondary | ICD-10-CM

## 2011-07-11 NOTE — Progress Notes (Signed)
Encounter addended by: Ree Shay, RN on: 07/11/2011  1:42 PM<BR>     Documentation filed: Charges VN

## 2011-07-12 NOTE — Progress Notes (Signed)
Encounter addended by: Clarene Critchley on: 07/12/2011 11:41 AM<BR>     Documentation filed: Flowsheet VN

## 2011-07-17 LAB — URINALYSIS, ROUTINE W REFLEX MICROSCOPIC
Bilirubin Urine: NEGATIVE
Glucose, UA: NEGATIVE
Glucose, UA: NEGATIVE
Hgb urine dipstick: NEGATIVE
Leukocytes, UA: NEGATIVE
Leukocytes, UA: NEGATIVE
Nitrite: NEGATIVE
Protein, ur: 30 — AB
Specific Gravity, Urine: 1.025
Specific Gravity, Urine: 1.025
Urobilinogen, UA: 0.2
pH: 6
pH: 6

## 2011-07-17 LAB — URINE MICROSCOPIC-ADD ON

## 2011-07-17 LAB — COMPREHENSIVE METABOLIC PANEL
AST: 31
CO2: 26
Calcium: 9.1
Creatinine, Ser: 0.71
GFR calc Af Amer: >60
GFR calc non Af Amer: >60

## 2011-07-17 LAB — POCT CARDIAC MARKERS
CKMB, poc: <1 — ABNORMAL LOW
Myoglobin, poc: 22.7
Operator id: 166561
Troponin i, poc: <0.05

## 2011-07-17 LAB — RAPID URINE DRUG SCREEN, HOSP PERFORMED
Amphetamines: NOT DETECTED
Barbiturates: NOT DETECTED
Benzodiazepines: NOT DETECTED
Cocaine: POSITIVE — AB
Opiates: POSITIVE — AB
Tetrahydrocannabinol: POSITIVE — AB

## 2011-07-17 LAB — PREGNANCY, URINE: Preg Test, Ur: NEGATIVE

## 2011-07-17 LAB — DIFFERENTIAL
Eosinophils Relative: 2
Lymphocytes Relative: 63 — ABNORMAL HIGH
Lymphs Abs: 2.3
Neutro Abs: 0.9 — ABNORMAL LOW
Neutrophils Relative %: 24 — ABNORMAL LOW

## 2011-07-17 LAB — CBC
HCT: 32.2 — ABNORMAL LOW
Hemoglobin: 11.2 — ABNORMAL LOW
MCHC: 34.8
MCV: 95.4
Platelets: 257
RBC: 3.37 — ABNORMAL LOW
RDW: 15.8 — ABNORMAL HIGH
WBC: 3.7 — ABNORMAL LOW

## 2011-07-18 ENCOUNTER — Ambulatory Visit: Payer: Medicare Other | Admitting: Family Medicine

## 2011-07-19 LAB — DIFFERENTIAL
Basophils Absolute: 0
Basophils Absolute: 0
Eosinophils Absolute: 0
Lymphocytes Relative: 60 — ABNORMAL HIGH
Lymphocytes Relative: 20
Lymphs Abs: 1.1
Neutro Abs: 1.5 — ABNORMAL LOW
Neutrophils Relative %: 32 — ABNORMAL LOW
Neutrophils Relative %: 70

## 2011-07-19 LAB — POCT CARDIAC MARKERS
CKMB, poc: 1.1
CKMB, poc: 1.3
CKMB, poc: <1 — ABNORMAL LOW
Myoglobin, poc: 31.6
Operator id: 106841
Operator id: 221061
Troponin i, poc: 0.09 — ABNORMAL HIGH
Troponin i, poc: <0.05
Troponin i, poc: <0.05

## 2011-07-19 LAB — COMPREHENSIVE METABOLIC PANEL
Albumin: 3.9
BUN: 2 — ABNORMAL LOW
CO2: 32
Chloride: 100
Creatinine, Ser: 0.67
GFR calc non Af Amer: >60
Glucose, Bld: 107 — ABNORMAL HIGH
Total Bilirubin: 0.4

## 2011-07-19 LAB — URINALYSIS, ROUTINE W REFLEX MICROSCOPIC
Bilirubin Urine: NEGATIVE
Hgb urine dipstick: NEGATIVE
Ketones, ur: NEGATIVE
Nitrite: NEGATIVE
Urobilinogen, UA: 0.2

## 2011-07-19 LAB — RAPID URINE DRUG SCREEN, HOSP PERFORMED
Benzodiazepines: NOT DETECTED
Cocaine: NOT DETECTED
Opiates: NOT DETECTED
Tetrahydrocannabinol: NOT DETECTED

## 2011-07-19 LAB — CBC
HCT: 36
MCV: 95.7
Platelets: 202
Platelets: 148 — ABNORMAL LOW
RDW: 14.9
WBC: 4.8
WBC: 5.4

## 2011-07-19 LAB — D-DIMER, QUANTITATIVE: D-Dimer, Quant: 0.44

## 2011-07-19 LAB — BASIC METABOLIC PANEL
BUN: 8
Creatinine, Ser: 1.15
GFR calc non Af Amer: 51 — ABNORMAL LOW
Glucose, Bld: 122 — ABNORMAL HIGH

## 2011-07-19 LAB — B-NATRIURETIC PEPTIDE (CONVERTED LAB): Pro B Natriuretic peptide (BNP): <30

## 2011-07-20 ENCOUNTER — Inpatient Hospital Stay: Admission: RE | Admit: 2011-07-20 | Payer: Medicare Other | Source: Ambulatory Visit

## 2011-07-23 ENCOUNTER — Ambulatory Visit: Payer: Medicare Other | Admitting: Infectious Diseases

## 2011-07-24 LAB — DIFFERENTIAL
Eosinophils Absolute: 0
Eosinophils Relative: 0
Lymphocytes Relative: 11 — ABNORMAL LOW
Lymphs Abs: 1
Monocytes Absolute: 0.7
Monocytes Relative: 8

## 2011-07-24 LAB — CBC
HCT: 37
Hemoglobin: 12.3
MCV: 91.4
Platelets: 236
WBC: 8.9

## 2011-07-24 LAB — BASIC METABOLIC PANEL
BUN: 14
Chloride: 95 — ABNORMAL LOW
GFR calc non Af Amer: >60
Potassium: 4
Sodium: 130 — ABNORMAL LOW

## 2011-07-25 ENCOUNTER — Telehealth: Payer: Self-pay | Admitting: Family Medicine

## 2011-07-26 ENCOUNTER — Telehealth: Payer: Self-pay

## 2011-07-26 NOTE — Telephone Encounter (Signed)
Pt called c/o needs a note sent to [facility] transportation stating she hs appt. With Dr Ninetta Lights on 07-27-11. She was discharged from [facility] and was instructed to f/u with Dr Ninetta Lights ASAP for changes in medication. Dr Ninetta Lights will be away starting 07-29-11 and will not be able to see pt until late October.   Note faxed to Transportation @ [phone] Attention : Tanya . Tomasita Morrow, RN

## 2011-07-27 ENCOUNTER — Ambulatory Visit (INDEPENDENT_AMBULATORY_CARE_PROVIDER_SITE_OTHER): Payer: Medicare Other | Admitting: Infectious Diseases

## 2011-07-27 ENCOUNTER — Encounter: Payer: Self-pay | Admitting: Infectious Diseases

## 2011-07-27 ENCOUNTER — Other Ambulatory Visit: Payer: Self-pay | Admitting: *Deleted

## 2011-07-27 VITALS — BP 119/88 | HR 104 | Temp 97.4°F | Ht 64.0 in | Wt 170.0 lb

## 2011-07-27 DIAGNOSIS — K861 Other chronic pancreatitis: Secondary | ICD-10-CM

## 2011-07-27 DIAGNOSIS — M87059 Idiopathic aseptic necrosis of unspecified femur: Secondary | ICD-10-CM

## 2011-07-27 DIAGNOSIS — B20 Human immunodeficiency virus [HIV] disease: Secondary | ICD-10-CM

## 2011-07-27 DIAGNOSIS — K219 Gastro-esophageal reflux disease without esophagitis: Secondary | ICD-10-CM

## 2011-07-27 DIAGNOSIS — Z21 Asymptomatic human immunodeficiency virus [HIV] infection status: Secondary | ICD-10-CM

## 2011-07-27 DIAGNOSIS — Z23 Encounter for immunization: Secondary | ICD-10-CM

## 2011-07-27 DIAGNOSIS — R300 Dysuria: Secondary | ICD-10-CM | POA: Insufficient documentation

## 2011-07-27 MED ORDER — EMTRICITABINE-TENOFOVIR DF 200-300 MG PO TABS: 1.0000 | ORAL_TABLET | Freq: Every day | ORAL | Status: DC

## 2011-07-27 MED ORDER — DARUNAVIR ETHANOLATE 400 MG PO TABS: 800.0000 mg | ORAL_TABLET | Freq: Once | ORAL | Status: DC

## 2011-07-27 MED ORDER — RITONAVIR 100 MG PO CAPS: 100.0000 mg | ORAL_CAPSULE | Freq: Two times a day (BID) | ORAL | Status: DC

## 2011-07-27 MED ORDER — RALTEGRAVIR POTASSIUM 400 MG PO TABS: 400.0000 mg | ORAL_TABLET | Freq: Two times a day (BID) | ORAL | Status: DC

## 2011-07-27 MED ORDER — EMTRICITAB-RILPIVIR-TENOFOV DF 200-25-300 MG PO TABS: 1.0000 | ORAL_TABLET | Freq: Every day | ORAL | Status: DC

## 2011-07-27 NOTE — Assessment & Plan Note (Signed)
Will watch on new rx for HIV.

## 2011-07-27 NOTE — Assessment & Plan Note (Signed)
She has f/u with ortho, may need THR

## 2011-07-27 NOTE — Progress Notes (Signed)
  Subjective:    Patient ID: [name], female    DOB: Feb 22, 1961, 50 y.o.   MRN: [id number]  HPI 50 yo F with long hx of HIV+, hx of hospitalization in [place] earlier this year with suspicion of PCP.  Was previously taking  DRVr/TZV. States she was in hospital 07-02-11 for knee pain bilaterally, She was found to have pancreatitis and hepatitis. She was taken off her ART. 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 She has previously had knee arthroscopies [facility] 2011, April 2012,  CD4 810 and VL 21,600 (08-11-10). Prev Genotype M184V. TV/DRVr.     Review of Systems  Constitutional: Negative for fever, chills and unexpected weight change.  Respiratory: Negative for shortness of breath.   Gastrointestinal: Positive for constipation. Negative for diarrhea.  Genitourinary: Positive for urgency, frequency and difficulty urinating.       Objective:   Physical Exam  Constitutional: She appears well-developed and well-nourished.  Eyes: EOM are normal. Pupils are equal, round, and reactive to light.  Neck: Neck supple.  Cardiovascular: Normal rate, regular rhythm and normal heart sounds.   Pulmonary/Chest: Effort normal and breath sounds normal. She has no wheezes. She has no rales.  Abdominal: Soft. Bowel sounds are normal. There is no tenderness.  Musculoskeletal:       Legs: Lymphadenopathy:    She has no cervical adenopathy.          Assessment & Plan:

## 2011-07-27 NOTE — Progress Notes (Signed)
Addended by: HATCHER, JEFFREY C on: 07/27/2011 11:59 AM   Modules accepted: Orders

## 2011-07-27 NOTE — Assessment & Plan Note (Signed)
Offered to check UA, UCx but she defers saying she does not need to go right now. She will f/u with Dr Lodema Hong when she needs to go. My great appreciation to Dr Lodema Hong for partnering with me

## 2011-07-27 NOTE — Assessment & Plan Note (Signed)
There was concern that her ART caused her pancreatitis. The ABC in her trizivir is the most likely candidate for this. Tenofovir and Epivir are very unlikely causes as was the PI. Will change her to complera and issentress and see if she responds and if she can tolerate these meds. Will ask her to hold PPI or space it as far as possible from complera.  She defers the flu shot but gets her pneumovax update. See her back in 2-3 months

## 2011-07-27 NOTE — Assessment & Plan Note (Signed)
Will see if she can come off PPI for complera

## 2011-07-31 NOTE — Telephone Encounter (Signed)
noted 

## 2011-08-01 ENCOUNTER — Telehealth: Payer: Self-pay | Admitting: Family Medicine

## 2011-08-01 NOTE — Telephone Encounter (Signed)
Left message and needs her medicines [facility] to [facility] need to check fax machine she needs her medicine and call her at [phone]

## 2011-08-02 ENCOUNTER — Emergency Department (HOSPITAL_COMMUNITY): Payer: Medicare Other

## 2011-08-02 ENCOUNTER — Emergency Department (HOSPITAL_COMMUNITY)
Admission: EM | Admit: 2011-08-02 | Discharge: 2011-08-03 | Disposition: A | Discharge status: Home / Self Care | Payer: Medicare Other | Attending: Emergency Medicine | Admitting: Emergency Medicine

## 2011-08-02 ENCOUNTER — Encounter (HOSPITAL_COMMUNITY): Payer: Self-pay | Admitting: *Deleted

## 2011-08-02 ENCOUNTER — Other Ambulatory Visit: Payer: Self-pay

## 2011-08-02 DIAGNOSIS — I10 Essential (primary) hypertension: Secondary | ICD-10-CM | POA: Insufficient documentation

## 2011-08-02 DIAGNOSIS — R079 Chest pain, unspecified: Secondary | ICD-10-CM | POA: Insufficient documentation

## 2011-08-02 DIAGNOSIS — K219 Gastro-esophageal reflux disease without esophagitis: Secondary | ICD-10-CM | POA: Insufficient documentation

## 2011-08-02 DIAGNOSIS — R110 Nausea: Secondary | ICD-10-CM | POA: Insufficient documentation

## 2011-08-02 DIAGNOSIS — F17200 Nicotine dependence, unspecified, uncomplicated: Secondary | ICD-10-CM | POA: Insufficient documentation

## 2011-08-02 DIAGNOSIS — B977 Papillomavirus as the cause of diseases classified elsewhere: Secondary | ICD-10-CM | POA: Insufficient documentation

## 2011-08-02 DIAGNOSIS — I498 Other specified cardiac arrhythmias: Secondary | ICD-10-CM | POA: Insufficient documentation

## 2011-08-02 DIAGNOSIS — J984 Other disorders of lung: Secondary | ICD-10-CM | POA: Insufficient documentation

## 2011-08-02 DIAGNOSIS — J45909 Unspecified asthma, uncomplicated: Secondary | ICD-10-CM | POA: Insufficient documentation

## 2011-08-02 DIAGNOSIS — F319 Bipolar disorder, unspecified: Secondary | ICD-10-CM | POA: Insufficient documentation

## 2011-08-02 DIAGNOSIS — Z21 Asymptomatic human immunodeficiency virus [HIV] infection status: Secondary | ICD-10-CM | POA: Insufficient documentation

## 2011-08-02 DIAGNOSIS — Z79899 Other long term (current) drug therapy: Secondary | ICD-10-CM | POA: Insufficient documentation

## 2011-08-02 DIAGNOSIS — R0602 Shortness of breath: Secondary | ICD-10-CM | POA: Insufficient documentation

## 2011-08-02 LAB — CBC
HCT: 39.4 % (ref 36.0–46.0)
Hemoglobin: 13.4 g/dL (ref 12.0–15.0)
MCV: 81.7 fL (ref 78.0–100.0)
RBC: 4.82 MIL/uL (ref 3.87–5.11)
RDW: 14 % (ref 11.5–15.5)
WBC: 10.9 10*3/uL — ABNORMAL HIGH (ref 4.0–10.5)

## 2011-08-02 LAB — COMPREHENSIVE METABOLIC PANEL
ALT: 19 U/L (ref 0–35)
AST: 24 U/L (ref 0–37)
CO2: 28 mEq/L (ref 19–32)
Chloride: 96 mEq/L (ref 96–112)
Creatinine, Ser: 0.47 mg/dL — ABNORMAL LOW (ref 0.50–1.10)
GFR calc Af Amer: >90 mL/min (ref >90)
GFR calc non Af Amer: >90 mL/min (ref >90)
Glucose, Bld: 97 mg/dL (ref 70–99)
Total Bilirubin: 0.4 mg/dL (ref 0.3–1.2)

## 2011-08-02 LAB — DIFFERENTIAL
Basophils Absolute: 0 10*3/uL (ref 0.0–0.1)
Eosinophils Relative: 0 % (ref 0–5)
Lymphocytes Relative: 29 % (ref 12–46)
Lymphs Abs: 3.2 10*3/uL (ref 0.7–4.0)
Monocytes Absolute: 0.5 10*3/uL (ref 0.1–1.0)
Neutro Abs: 7.2 10*3/uL (ref 1.7–7.7)

## 2011-08-02 LAB — CARDIAC PANEL(CRET KIN+CKTOT+MB+TROPI)
Relative Index: INVALID (ref 0.0–2.5)
Total CK: 44 U/L (ref 7–177)

## 2011-08-02 MED ORDER — MORPHINE SULFATE 4 MG/ML IJ SOLN
4.0000 mg | Freq: Once | INTRAMUSCULAR | Status: AC
  Administered 2011-08-02: 4 mg via INTRAVENOUS
  Filled 2011-08-02: qty 1

## 2011-08-02 MED ORDER — ONDANSETRON HCL 4 MG/2ML IJ SOLN
4.0000 mg | Freq: Once | INTRAMUSCULAR | Status: AC
  Administered 2011-08-02: 4 mg via INTRAVENOUS
  Filled 2011-08-02: qty 2

## 2011-08-02 NOTE — ED Notes (Signed)
Patient is very anxious, states she has been having problems since 1700 today, c/o intermittent episodes of chest pain, ekg performed

## 2011-08-02 NOTE — ED Notes (Signed)
Short of breath onset 1700 today

## 2011-08-02 NOTE — Telephone Encounter (Signed)
Patient aware.

## 2011-08-03 ENCOUNTER — Encounter (HOSPITAL_COMMUNITY): Payer: Self-pay | Admitting: Emergency Medicine

## 2011-08-03 ENCOUNTER — Emergency Department (HOSPITAL_COMMUNITY): Payer: Medicare Other

## 2011-08-03 LAB — BASIC METABOLIC PANEL
BUN: 5 — ABNORMAL LOW
CO2: 24
CO2: 24
CO2: 27
Calcium: 7.9 — ABNORMAL LOW
Calcium: 9.1
Chloride: 103
Chloride: 103
Creatinine, Ser: 0.52
Creatinine, Ser: 0.68
GFR calc Af Amer: >60
GFR calc Af Amer: >60
GFR calc Af Amer: >60
GFR calc non Af Amer: >60
Glucose, Bld: 84
Glucose, Bld: 99
Potassium: 3.5
Potassium: 4.2
Sodium: 141

## 2011-08-03 LAB — DIFFERENTIAL
Basophils Absolute: 0
Basophils Absolute: 0
Basophils Relative: 0
Basophils Relative: 0
Basophils Relative: 0
Basophils Relative: 0
Eosinophils Absolute: 0
Eosinophils Absolute: 0
Eosinophils Absolute: 0.1
Eosinophils Absolute: 0.1
Eosinophils Relative: 1
Eosinophils Relative: 1
Eosinophils Relative: 2
Lymphocytes Relative: 30
Lymphs Abs: 1.1
Lymphs Abs: 1.1
Monocytes Absolute: 0.4
Monocytes Absolute: 0.4
Monocytes Absolute: 0.5
Monocytes Relative: 13 — ABNORMAL HIGH
Monocytes Relative: 8
Monocytes Relative: 9
Monocytes Relative: 9
Neutro Abs: 2.2
Neutro Abs: 3.3
Neutrophils Relative %: 65
Neutrophils Relative %: 65

## 2011-08-03 LAB — COMPREHENSIVE METABOLIC PANEL
ALT: 29
AST: 50 — ABNORMAL HIGH
CO2: 28
Calcium: 7.9 — ABNORMAL LOW
Creatinine, Ser: 0.45
GFR calc Af Amer: >60
GFR calc non Af Amer: >60
Sodium: 134 — ABNORMAL LOW
Total Protein: 6.1

## 2011-08-03 LAB — CBC
HCT: 22.9 — ABNORMAL LOW
HCT: 30.6 — ABNORMAL LOW
HCT: 31.6 — ABNORMAL LOW
Hemoglobin: 10.6 — ABNORMAL LOW
Hemoglobin: 7.8 — CL
MCHC: 33.5
MCHC: 33.6
MCHC: 33.7
MCHC: 34
MCHC: 34
MCHC: 34.3
MCV: 104.8 — ABNORMAL HIGH
MCV: 105.3 — ABNORMAL HIGH
MCV: 97.3
MCV: 98.1
Platelets: 154
Platelets: 174
RBC: 2.36 — ABNORMAL LOW
RBC: 3.07 — ABNORMAL LOW
RBC: 3.15 — ABNORMAL LOW
RDW: 19.1 — ABNORMAL HIGH
RDW: 19.4 — ABNORMAL HIGH
RDW: 19.7 — ABNORMAL HIGH
RDW: 24.1 — ABNORMAL HIGH
WBC: 5.2

## 2011-08-03 LAB — URINALYSIS, ROUTINE W REFLEX MICROSCOPIC
Bilirubin Urine: NEGATIVE
Glucose, UA: NEGATIVE mg/dL
Hgb urine dipstick: NEGATIVE
Ketones, ur: NEGATIVE mg/dL
Ketones, ur: NEGATIVE
Leukocytes, UA: NEGATIVE
Nitrite: NEGATIVE
Protein, ur: NEGATIVE mg/dL
Urobilinogen, UA: 0.2 mg/dL (ref 0.0–1.0)
Urobilinogen, UA: 0.2
pH: 5.5

## 2011-08-03 LAB — HEPATIC FUNCTION PANEL
ALT: 108 — ABNORMAL HIGH
ALT: 33
AST: 336 — ABNORMAL HIGH
AST: 66 — ABNORMAL HIGH
Alkaline Phosphatase: 141 — ABNORMAL HIGH
Bilirubin, Direct: 0.6 — ABNORMAL HIGH
Bilirubin, Direct: 0.6 — ABNORMAL HIGH
Indirect Bilirubin: 0.7
Indirect Bilirubin: 0.9
Total Bilirubin: 1.3 — ABNORMAL HIGH
Total Protein: 8.7 — ABNORMAL HIGH

## 2011-08-03 LAB — CROSSMATCH: ABO/RH(D): A POS

## 2011-08-03 LAB — RAPID URINE DRUG SCREEN, HOSP PERFORMED
Amphetamines: NOT DETECTED
Cocaine: POSITIVE — AB
Opiates: NOT DETECTED
Tetrahydrocannabinol: POSITIVE — AB

## 2011-08-03 LAB — CK TOTAL AND CKMB (NOT AT ARMC)
CK, MB: 1
Relative Index: 0.8
Total CK: 129

## 2011-08-03 LAB — URINE MICROSCOPIC-ADD ON

## 2011-08-03 LAB — ABO/RH: ABO/RH(D): A POS

## 2011-08-03 LAB — OCCULT BLOOD X 1 CARD TO LAB, STOOL: Fecal Occult Bld: NEGATIVE

## 2011-08-03 LAB — RETICULOCYTES: Retic Count, Absolute: 63.8

## 2011-08-03 LAB — IRON AND TIBC
Iron: 29 — ABNORMAL LOW
TIBC: 219 — ABNORMAL LOW

## 2011-08-03 LAB — AMYLASE
Amylase: 103
Amylase: 106

## 2011-08-03 LAB — VITAMIN B12: Vitamin B-12: 460 (ref 211–911)

## 2011-08-03 LAB — TROPONIN I: Troponin I: 0.02

## 2011-08-03 LAB — D-DIMER, QUANTITATIVE: D-Dimer, Quant: 0.97 ug/mL-FEU — ABNORMAL HIGH (ref 0.00–0.48)

## 2011-08-03 MED ORDER — POTASSIUM CHLORIDE CRYS ER 20 MEQ PO TBCR
20.0000 meq | EXTENDED_RELEASE_TABLET | Freq: Once | ORAL | Status: AC
  Administered 2011-08-03: 20 meq via ORAL
  Filled 2011-08-03: qty 1

## 2011-08-03 MED ORDER — IOHEXOL 350 MG/ML SOLN
100.0000 mL | Freq: Once | INTRAVENOUS | Status: AC | PRN
  Administered 2011-08-03: 100 mL via INTRAVENOUS

## 2011-08-03 MED ORDER — ACETAMINOPHEN 325 MG PO TABS
ORAL_TABLET | ORAL | Status: AC
  Filled 2011-08-03: qty 2

## 2011-08-03 MED ORDER — ACETAMINOPHEN 325 MG PO TABS
650.0000 mg | ORAL_TABLET | Freq: Once | ORAL | Status: AC
  Administered 2011-08-03: 650 mg via ORAL

## 2011-08-03 MED ORDER — FAMOTIDINE 20 MG PO TABS
20.0000 mg | ORAL_TABLET | Freq: Once | ORAL | Status: AC
  Administered 2011-08-03: 20 mg via ORAL
  Filled 2011-08-03: qty 1

## 2011-08-03 MED ORDER — PROMETHAZINE HCL 25 MG/ML IJ SOLN
12.5000 mg | Freq: Once | INTRAMUSCULAR | Status: AC
  Administered 2011-08-03: 03:00:00 via INTRAVENOUS
  Filled 2011-08-03: qty 1

## 2011-08-03 NOTE — Progress Notes (Signed)
0230 Assumed care/disposition of patient. Patient presents with sharp stabbing chest pain and shortness of breath. Xray negative, labs unremarkable. D-dimer positive. Awaitng CT scan to rule out PE. Patient is resting comfortably. O2 sats 98% on room air. Pain has been addressed with analgesics. 1610 CT negative. Reviewed results with patient. She is ready for discharge. She will follow up with PCP, Dr. Lodema Hong. Ct Angio Chest W/cm &/or Wo Cm  08/03/2011  *RADIOLOGY REPORT*  Clinical Data:  Shortness of breath, tachycardia.  CT ANGIOGRAPHY CHEST WITH CONTRAST  Technique:  Multidetector CT imaging of the chest was performed using the standard protocol during bolus administration of intravenous contrast.  Multiplanar CT image reconstructions including MIPs were obtained to evaluate the vascular anatomy.  Contrast: OMNIPAQUE IOHEXOL 350 MG/ML IV SOLN  Comparison:  08/02/2011 radiograph  Findings:  No filling defect within the pulmonary vasculature. Mildly enlarged heart.  Coronary artery calcification.  Mild ectasia of the ascending aorta.  No aneurysmal dilatation or dissection.  Small hiatal hernia.  Distal esophageal wall thickening may reflect gastroesophageal reflux disease.  There is a mildly prominent right superior paratracheal node measuring 1 cm short axis (image four).  In addition, there are several mildly prominent right supraclavicular and axillary lymph nodes.  Limited images through the upper abdomen demonstrate sequelae of chronic pancreatitis.  Status post cholecystectomy.  Central airways are clear.  Linear middle lobe and lingular opacities likely reflect scarring versus atelectasis.  Mild left lung base scarring versus atelectasis.  Respiratory motion degrades detailed evaluation of several levels.  Bilateral fissural nodules likely reflect lymph nodes.  No acute osseous abnormality.  Review of the MIP images confirms the above findings.  IMPRESSION: No pulmonary embolism or acute  intrathoracic process identified.  There are a few nonspecific mildly prominent lymph nodes within the right supraclavicular region and right axilla. Consider an ultrasound follow-up to further characterize and sample if warranted.  Small hiatal hernia.  Distal esophageal wall thickening may represent gastroesophageal reflux disease.  Coronary artery calcification.  Original Report Authenticated By: Waneta Martins, M.D.   Dg Chest Portable 1 View  08/02/2011  *RADIOLOGY REPORT*  Clinical Data: Chest pain.  Shortness of breath.  PORTABLE CHEST - 1 VIEW 08/02/2011 2232 hours:  Comparison: Two-view chest x-ray 11/20/2009 and 08/24/2008.  Findings: Suboptimal inspiration accounts for atelectasis in the lung bases.  This also accentuates the cardiac silhouette which is upper normal in size to perhaps slightly enlarged but stable. Pulmonary vascularity normal.  No visible pleural effusions.  IMPRESSION: Suboptimal inspiration accounts for bibasilar atelectasis.  No acute cardiopulmonary disease otherwise.  Original Report Authenticated By: Arnell Sieving, M.D.    Medical screening examination/treatment/procedure(s) were conducted as a shared visit with non-physician practitioner(s) and myself.  I personally evaluated the patient during the encounter

## 2011-08-03 NOTE — ED Notes (Signed)
Pt left the er stating no needs 

## 2011-08-03 NOTE — ED Notes (Signed)
Pt ambulated with the assist of 1 to rest room and back

## 2011-08-03 NOTE — ED Notes (Signed)
Pt to rest in the room until her ride arrives warm blankets given.

## 2011-08-03 NOTE — ED Provider Notes (Signed)
History     CSN: [id number] Arrival date & time: 08/02/2011  9:23 PM  Chief Complaint  Patient presents with  . Shortness of Breath    (Consider location/radiation/quality/duration/timing/severity/associated sxs/prior treatment) Patient is a 50 y.o. female presenting with shortness of breath. The history is provided by the patient.  Shortness of Breath  The current episode started today. The problem occurs continuously. The problem has been unchanged. The problem is moderate. The symptoms are relieved by nothing. Exacerbated by: deep inspiration. Associated symptoms include chest pain and shortness of breath. Pertinent negatives include no chest pressure, no orthopnea, no fever, no rhinorrhea, no sore throat, no stridor, no cough and no wheezing. Associated symptoms comments: She reports frequent stabbing pain in her left lower chest and left upper abdomen,  Worse with deep inspiration,  Since the sob started around 5 pm.. Context: she was napping when the symptoms woke her. Prior hospitalizations: Was recently admitted for pancreatitis.    Past Medical History  Diagnosis Date  . Baker's cyst   . Pulmonary lesion 1/03    on RUL onn CT   . Hemorrhoids   . Zoster 2001  . Family history of diabetes mellitus   . Tobacco user   . Other abnormal Papanicolaou smear of cervix and cervical HPV   . Herpes zoster   . Hemorrhoids   . Baker's cyst   . Pancreatitis   . GERD (gastroesophageal reflux disease)   . Shortness of breath   . HPV (human papilloma virus) infection   . HIV disease   . Bipolar disorder     With hx of psychotic features.  . Articular cartilage disorder of knee   . Asthma   . Hypertension     Past Surgical History  Procedure Date  . Right breast lumpectomy   . Total abdominal hysterectomy w/ bilateral salpingoophorectomy   . Cholecystectomy   . Left knee athroscopy   . Right knee arthoscophy 02/2011  . Breast surgery   . Cholecystectomy     Family History    Problem Relation Age of Onset  . Diabetes      Family History  . Hypertension      Family history   . Hypertension Mother   . Diabetes Mother   . Diabetes Father   . Cancer Sister 54    x2 (liver)  . Diabetes Brother     History  Substance Use Topics  . Smoking status: Current Everyday Smoker -- 1.0 packs/day    Types: Cigarettes  . Smokeless tobacco: Never Used  . Alcohol Use: No    OB History    Grav Para Term Preterm Abortions TAB SAB Ect Mult Living                  Review of Systems  Constitutional: Negative for fever, chills and diaphoresis.  HENT: Negative for congestion, sore throat, rhinorrhea and neck pain.   Eyes: Negative.   Respiratory: Positive for shortness of breath. Negative for cough, chest tightness, wheezing and stridor.   Cardiovascular: Positive for chest pain. Negative for orthopnea.  Gastrointestinal: Positive for nausea. Negative for vomiting and abdominal pain.  Genitourinary: Negative.   Musculoskeletal: Negative for joint swelling and arthralgias.  Skin: Negative.  Negative for rash and wound.  Neurological: Negative for dizziness, weakness, light-headedness, numbness and headaches.  Hematological: Negative.   Psychiatric/Behavioral: Negative.     Allergies  Aspirin and Sulfonamide derivatives  Home Medications   Current Outpatient Rx  Name Route Sig  Dispense Refill  . ALBUTEROL SULFATE HFA 108 (90 BASE) MCG/ACT IN AERS Inhalation Inhale 2 puffs into the lungs every 6 (six) hours as needed. 2 puffs every 4-6 hours as needed for shortness of breath    . ALPRAZOLAM 1 MG PO TABS Oral Take 1 mg by mouth 2 (two) times daily. Take 1 tablet by mouth two times a day as needed for anxiety     . BECLOMETHASONE DIPROPIONATE 40 MCG/ACT IN AERS Inhalation Inhale 2 puffs into the lungs 2 (two) times daily. 2 puffs two times a day     . DARUNAVIR ETHANOLATE 400 MG PO TABS Oral Take 2 tablets (800 mg total) by mouth once. 180 tablet 3  .  EMTRICITABINE-TENOFOVIR 200-300 MG PO TABS Oral Take 1 tablet by mouth daily. 90 tablet 3  . ESOMEPRAZOLE MAGNESIUM 40 MG PO CPDR Oral Take 1 capsule (40 mg total) by mouth daily before breakfast. Take one capsule by mouth once a day 90 capsule 1  . GABAPENTIN (PHN) 300 MG PO TABS Oral Take 300 mg by mouth 3 (three) times daily. One cap by mouth at bedtime    . PANCRELIPASE (LIP-PROT-AMYL) 12000 UNITS PO CPEP Oral Take 1 capsule by mouth 3 (three) times daily before meals. 270 capsule 0  . PROMETHAZINE HCL 25 MG PO TABS Oral Take 1 tablet (25 mg total) by mouth every 6 (six) hours as needed for nausea.    Marland Kitchen QUETIAPINE FUMARATE 100 MG PO TABS Oral Take 1 tablet (100 mg total) by mouth at bedtime. Take one tablet by mouth at bedtime    . RALTEGRAVIR POTASSIUM 400 MG PO TABS Oral Take 1 tablet (400 mg total) by mouth 2 (two) times daily. 180 tablet 3  . RITONAVIR 100 MG PO CAPS Oral Take 1 capsule (100 mg total) by mouth 2 (two) times daily. 90 capsule 3  . VERAPAMIL HCL ER 240 MG PO CP24 Oral Take 1 capsule (240 mg total) by mouth daily. 30 capsule 2    [employer]    BP 150/127  Pulse 112  Temp(Src) 97.4 F (36.3 C) (Oral)  Resp 16  Ht 5\' 4"  (1.626 m)  Wt 160 lb (72.576 kg)  BMI 27.46 kg/m2  SpO2 100%  Physical Exam  Nursing note and vitals reviewed. Constitutional: She is oriented to person, place, and time. She appears well-developed and well-nourished.  HENT:  Head: Normocephalic and atraumatic.  Eyes: Conjunctivae are normal.  Neck: Normal range of motion.  Cardiovascular: Regular rhythm, normal heart sounds and intact distal pulses.  Tachycardia present.   Pulses:      Popliteal pulses are 2+ on the right side, and 2+ on the left side.       No peripheral edema,  Calves soft and nontender.  Pulmonary/Chest: Effort normal and breath sounds normal. She has no wheezes.  Abdominal: Soft. Bowel sounds are normal. There is no tenderness.  Musculoskeletal: Normal range of motion.    Neurological: She is alert and oriented to person, place, and time.  Skin: Skin is warm and dry.  Psychiatric: She has a normal mood and affect.    ED Course  Procedures (including critical care time)  Labs Reviewed  CBC - Abnormal; Notable for the following:    WBC 10.9 (*)    All other components within normal limits  COMPREHENSIVE METABOLIC PANEL - Abnormal; Notable for the following:    Potassium 3.2 (*)    Creatinine, Ser 0.47 (*)    Total Protein  8.6 (*)    Alkaline Phosphatase 143 (*)    All other components within normal limits  URINALYSIS, ROUTINE W REFLEX MICROSCOPIC - Abnormal; Notable for the following:    Appearance HAZY (*)    pH >9.0 (*)    Leukocytes, UA TRACE (*)    All other components within normal limits  URINE MICROSCOPIC-ADD ON - Abnormal; Notable for the following:    Squamous Epithelial / LPF MANY (*)    Bacteria, UA MANY (*)    All other components within normal limits  D-DIMER, QUANTITATIVE - Abnormal; Notable for the following:    D-Dimer, Quant 0.97 (*)    All other components within normal limits  CARDIAC PANEL(CRET KIN+CKTOT+MB+TROPI)  DIFFERENTIAL  LIPASE, BLOOD  POCT I-STAT TROPONIN I   Dg Chest Portable 1 View  08/02/2011  *RADIOLOGY REPORT*  Clinical Data: Chest pain.  Shortness of breath.  PORTABLE CHEST - 1 VIEW 08/02/2011 2232 hours:  Comparison: Two-view chest x-ray 11/20/2009 and 08/24/2008.  Findings: Suboptimal inspiration accounts for atelectasis in the lung bases.  This also accentuates the cardiac silhouette which is upper normal in size to perhaps slightly enlarged but stable. Pulmonary vascularity normal.  No visible pleural effusions.  IMPRESSION: Suboptimal inspiration accounts for bibasilar atelectasis.  No acute cardiopulmonary disease otherwise.  Original Report Authenticated By: Arnell Sieving, M.D.     No diagnosis found.   Pending Ct angio chest at this time.  Dr Colon Branch to dispo patient after study completed. MDM   Pleuritic sudden onset sob and chest pain with slightly elevated d dimer.     Date: 10/112012  Rate: 113  Rhythm: sinus tachycardia  QRS Axis: normal  Intervals: normal  ST/T Wave abnormalities: nonspecific ST changes  Conduction Disutrbances:none  Narrative Interpretation:   Old EKG Reviewed: unchanged          Candis Musa, PA 08/03/11 0240

## 2011-08-06 ENCOUNTER — Other Ambulatory Visit: Payer: Self-pay | Admitting: Family Medicine

## 2011-08-07 LAB — DIFFERENTIAL
Basophils Absolute: 0
Basophils Absolute: 0
Basophils Relative: 0
Eosinophils Relative: 0
Eosinophils Relative: 0
Lymphocytes Relative: 20
Lymphocytes Relative: 5 — ABNORMAL LOW
Lymphocytes Relative: 8 — ABNORMAL LOW
Lymphocytes Relative: 9 — ABNORMAL LOW
Lymphs Abs: 0.7
Lymphs Abs: 1.1
Lymphs Abs: 1.6
Monocytes Absolute: 0.2
Monocytes Absolute: 0.5
Monocytes Relative: 3
Monocytes Relative: 3
Neutro Abs: 11.4 — ABNORMAL HIGH
Neutro Abs: 12.2 — ABNORMAL HIGH
Neutro Abs: 14.7 — ABNORMAL HIGH
Neutrophils Relative %: 88 — ABNORMAL HIGH
Neutrophils Relative %: 89 — ABNORMAL HIGH

## 2011-08-07 LAB — BASIC METABOLIC PANEL
CO2: 23
Calcium: 6.5 — ABNORMAL LOW
Calcium: 7.6 — ABNORMAL LOW
Chloride: 94 — ABNORMAL LOW
Creatinine, Ser: 0.89
GFR calc Af Amer: >60
GFR calc Af Amer: >60
Sodium: 129 — ABNORMAL LOW

## 2011-08-07 LAB — COMPREHENSIVE METABOLIC PANEL
AST: 28
AST: 36
Albumin: 2.5 — ABNORMAL LOW
BUN: 3 — ABNORMAL LOW
BUN: 4 — ABNORMAL LOW
CO2: 27
Calcium: 8.6
Chloride: 103
Chloride: 105
Creatinine, Ser: 0.58
Creatinine, Ser: 1.66 — ABNORMAL HIGH
GFR calc Af Amer: 41 — ABNORMAL LOW
GFR calc Af Amer: >60
GFR calc non Af Amer: 34 — ABNORMAL LOW
Glucose, Bld: 106 — ABNORMAL HIGH
Total Bilirubin: 0.6
Total Bilirubin: 1.2

## 2011-08-07 LAB — TSH: TSH: 0.702

## 2011-08-07 LAB — CBC
HCT: 31.1 — ABNORMAL LOW
HCT: 34.5 — ABNORMAL LOW
Hemoglobin: 10.9 — ABNORMAL LOW
Hemoglobin: 11.9 — ABNORMAL LOW
MCHC: 33.5
MCHC: 33.8
MCV: 95.3
MCV: 95.7
MCV: 96.4
Platelets: 140 — ABNORMAL LOW
RBC: 3.4 — ABNORMAL LOW
RBC: 3.63 — ABNORMAL LOW
RBC: 3.86 — ABNORMAL LOW
RDW: 15.7 — ABNORMAL HIGH
WBC: 13.7 — ABNORMAL HIGH
WBC: 16.8 — ABNORMAL HIGH
WBC: 4.8

## 2011-08-07 LAB — POCT CARDIAC MARKERS
Myoglobin, poc: 81.1
Operator id: 106841

## 2011-08-07 LAB — RAPID URINE DRUG SCREEN, HOSP PERFORMED
Amphetamines: NOT DETECTED
Barbiturates: NOT DETECTED

## 2011-08-07 LAB — URINALYSIS, ROUTINE W REFLEX MICROSCOPIC
Glucose, UA: 100 — AB
Glucose, UA: NEGATIVE
Nitrite: NEGATIVE
pH: 5
pH: 6

## 2011-08-07 LAB — CULTURE, BLOOD (ROUTINE X 2)
Culture: NO GROWTH
Report Status: 7152008

## 2011-08-07 LAB — URINE MICROSCOPIC-ADD ON

## 2011-08-07 LAB — LIPID PANEL
Cholesterol: 109
HDL: 74
LDL Cholesterol: 19
Triglycerides: 79

## 2011-08-07 LAB — PHOSPHORUS: Phosphorus: 3.2

## 2011-08-07 LAB — ETHANOL: Alcohol, Ethyl (B): <5

## 2011-08-08 ENCOUNTER — Encounter: Payer: Self-pay | Admitting: Family Medicine

## 2011-08-08 ENCOUNTER — Ambulatory Visit: Payer: Medicare Other | Admitting: Family Medicine

## 2011-08-08 NOTE — ED Provider Notes (Signed)
Medical screening examination/treatment/procedure(s) were performed by non-physician practitioner and as supervising physician I was immediately available for consultation/collaboration.  Nicholes Stairs, MD 08/08/11 [phone]

## 2011-08-09 ENCOUNTER — Other Ambulatory Visit: Payer: Medicare Other

## 2011-08-09 LAB — BASIC METABOLIC PANEL
BUN: 11
CO2: 31
Chloride: 96
GFR calc Af Amer: >60
Potassium: 3.2 — ABNORMAL LOW

## 2011-08-09 LAB — POCT CARDIAC MARKERS
Myoglobin, poc: 68.7
Operator id: 228551
Troponin i, poc: <0.05

## 2011-08-16 ENCOUNTER — Ambulatory Visit
Admission: RE | Admit: 2011-08-16 | Discharge: 2011-08-16 | Disposition: A | Discharge status: Home / Self Care | Payer: Medicare Other | Source: Ambulatory Visit | Attending: Neurology | Admitting: Neurology

## 2011-08-16 DIAGNOSIS — R269 Unspecified abnormalities of gait and mobility: Secondary | ICD-10-CM

## 2011-08-16 MED ORDER — GADOBENATE DIMEGLUMINE 529 MG/ML IV SOLN
15.0000 mL | Freq: Once | INTRAVENOUS | Status: AC | PRN
  Administered 2011-08-16: 15 mL via INTRAVENOUS

## 2011-08-21 ENCOUNTER — Telehealth: Payer: Self-pay | Admitting: *Deleted

## 2011-08-21 NOTE — Telephone Encounter (Signed)
She wanted too know what hiv meds she was on. I read them to her from the med list

## 2011-08-24 ENCOUNTER — Other Ambulatory Visit: Payer: Self-pay | Admitting: Family Medicine

## 2011-08-28 ENCOUNTER — Other Ambulatory Visit (HOSPITAL_COMMUNITY): Payer: Self-pay | Admitting: Psychiatry

## 2011-08-28 DIAGNOSIS — F319 Bipolar disorder, unspecified: Secondary | ICD-10-CM

## 2011-08-28 MED ORDER — DIVALPROEX SODIUM 250 MG PO DR TAB: DELAYED_RELEASE_TABLET | ORAL | Status: DC

## 2011-09-22 ENCOUNTER — Other Ambulatory Visit (HOSPITAL_COMMUNITY): Payer: Self-pay

## 2011-09-26 ENCOUNTER — Other Ambulatory Visit: Payer: Self-pay

## 2011-09-26 ENCOUNTER — Ambulatory Visit (HOSPITAL_COMMUNITY)
Admission: RE | Admit: 2011-09-26 | Discharge: 2011-09-26 | Disposition: A | Discharge status: Home / Self Care | Payer: Medicare Other | Source: Ambulatory Visit | Attending: Infectious Diseases | Admitting: Infectious Diseases

## 2011-09-26 ENCOUNTER — Ambulatory Visit (INDEPENDENT_AMBULATORY_CARE_PROVIDER_SITE_OTHER): Payer: Medicare Other | Admitting: Infectious Diseases

## 2011-09-26 ENCOUNTER — Encounter: Payer: Self-pay | Admitting: Infectious Diseases

## 2011-09-26 DIAGNOSIS — Z21 Asymptomatic human immunodeficiency virus [HIV] infection status: Secondary | ICD-10-CM

## 2011-09-26 DIAGNOSIS — R079 Chest pain, unspecified: Secondary | ICD-10-CM | POA: Insufficient documentation

## 2011-09-26 DIAGNOSIS — R42 Dizziness and giddiness: Secondary | ICD-10-CM | POA: Insufficient documentation

## 2011-09-26 DIAGNOSIS — B20 Human immunodeficiency virus [HIV] disease: Secondary | ICD-10-CM

## 2011-09-26 DIAGNOSIS — M87059 Idiopathic aseptic necrosis of unspecified femur: Secondary | ICD-10-CM

## 2011-09-26 DIAGNOSIS — F319 Bipolar disorder, unspecified: Secondary | ICD-10-CM

## 2011-09-26 NOTE — Assessment & Plan Note (Addendum)
Will recheck her CD4 and VL today. She appears to be taking her ART well. Will see her back in 3 months. She states that her Pap and Mammo are uptodate, has them done through her GYN's office. She initially refuses the flu shot today although may have warmed to idea.

## 2011-09-26 NOTE — Assessment & Plan Note (Signed)
She will get ECG (NSR 78 bpm, possible LAE, ? Antero-lateral infarct,no acute ST-T changes). She has multiple risk factors for CV disease. I have asked her to let Dr Lodema Hong know she has been having problems with CP and dizziness.

## 2011-09-26 NOTE — Assessment & Plan Note (Signed)
She has prev had f/u with ortho. Will assure she has f/u appt.

## 2011-09-26 NOTE — Progress Notes (Signed)
  Subjective:    Patient ID: [name], female    DOB: 03-Aug-1961, 50 y.o.   MRN: [id number]  HPI 50 yo F with long hx of HIV+, hx of hospitalization in [place] earlier this year with suspicion of PCP. Was previously taking DRVr/TZV. States she was in hospital 07-02-11 for knee pain bilaterally, She was found to have pancreatitis and hepatitis. She was taken off her ART. 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 She has previously had knee arthroscopies [facility] 2011, April 2012,  CD4 810 and VL 21,600 (08-11-10). Prev Genotype M184V.  At her previous visit she was switched to DRVr/TRV/ISN to avoid the ABC component of the trizivir she had been taking previously (due to an episod eof pancreatitis). In the intervening period from her last visit she has had a CT chest : No pulmonary embolism or acute intrathoracic process identified.  There are a few nonspecific mildly prominent lymph nodes within the right supraclavicular region and right axilla. Consider an ultrasound follow-up to further characterize and sample ifwarranted.  Small hiatal hernia.  Distal esophageal wall thickening may represent gastroesophageal reflux disease.  Coronary artery calcification.  And a MRI of the head due to a gait disorder (normal 08-16-11). Today states she has been having dizzy episodes for last 5 days. Having some problems with overflow incontinence. Attributed to her hip/leg problems.  Has been doing ok with new rx's. Has been trying to get on schedule with taking her meds.  Has been having chest tightness, pain. For ~1 week. Not sure if this because she fell. She had one episode where she blacked out. Last week had a "real bad cold".     Review of Systems     Objective:   Physical Exam  Constitutional: She appears well-developed and well-nourished.  Eyes: EOM are normal. Pupils are equal, round, and reactive to light.  Neck: Neck supple.  Cardiovascular: Normal rate, regular rhythm and  normal heart sounds.   Pulmonary/Chest: She exhibits no tenderness.  Abdominal: Soft. Bowel sounds are normal. There is no tenderness.  Lymphadenopathy:    She has no cervical adenopathy.          Assessment & Plan:

## 2011-09-26 NOTE — Assessment & Plan Note (Signed)
She appears to be at her baseline today.

## 2011-09-28 LAB — HIV-1 RNA ULTRAQUANT REFLEX TO GENTYP+
HIV 1 RNA Quant: 21 copies/mL (ref <20)
HIV-1 RNA Quant, Log: 1.32 {Log} — ABNORMAL HIGH (ref <1.30)

## 2011-11-02 ENCOUNTER — Encounter: Payer: Self-pay | Admitting: Family Medicine

## 2011-11-02 ENCOUNTER — Ambulatory Visit: Payer: Medicare Other | Admitting: Family Medicine

## 2011-11-12 ENCOUNTER — Other Ambulatory Visit: Payer: Self-pay | Admitting: Family Medicine

## 2011-11-12 ENCOUNTER — Encounter: Payer: Self-pay | Admitting: Family Medicine

## 2011-11-14 ENCOUNTER — Ambulatory Visit (INDEPENDENT_AMBULATORY_CARE_PROVIDER_SITE_OTHER): Payer: Medicare Other | Admitting: Family Medicine

## 2011-11-14 ENCOUNTER — Encounter: Payer: Self-pay | Admitting: Family Medicine

## 2011-11-14 VITALS — BP 128/90 | HR 100 | Resp 20 | Ht 64.0 in | Wt 168.0 lb

## 2011-11-14 DIAGNOSIS — M25512 Pain in left shoulder: Secondary | ICD-10-CM

## 2011-11-14 DIAGNOSIS — R946 Abnormal results of thyroid function studies: Secondary | ICD-10-CM

## 2011-11-14 DIAGNOSIS — J45909 Unspecified asthma, uncomplicated: Secondary | ICD-10-CM

## 2011-11-14 DIAGNOSIS — J019 Acute sinusitis, unspecified: Secondary | ICD-10-CM

## 2011-11-14 DIAGNOSIS — I10 Essential (primary) hypertension: Secondary | ICD-10-CM

## 2011-11-14 DIAGNOSIS — F411 Generalized anxiety disorder: Secondary | ICD-10-CM

## 2011-11-14 DIAGNOSIS — J209 Acute bronchitis, unspecified: Secondary | ICD-10-CM

## 2011-11-14 DIAGNOSIS — M25519 Pain in unspecified shoulder: Secondary | ICD-10-CM

## 2011-11-14 DIAGNOSIS — F419 Anxiety disorder, unspecified: Secondary | ICD-10-CM

## 2011-11-14 DIAGNOSIS — R5381 Other malaise: Secondary | ICD-10-CM

## 2011-11-14 DIAGNOSIS — IMO0002 Reserved for concepts with insufficient information to code with codable children: Secondary | ICD-10-CM

## 2011-11-14 DIAGNOSIS — M25511 Pain in right shoulder: Secondary | ICD-10-CM

## 2011-11-14 DIAGNOSIS — R5383 Other fatigue: Secondary | ICD-10-CM

## 2011-11-14 LAB — COMPREHENSIVE METABOLIC PANEL
Alkaline Phosphatase: 128 U/L — ABNORMAL HIGH (ref 39–117)
BUN: 15 mg/dL (ref 6–23)
CO2: 24 mEq/L (ref 19–32)
Creat: 0.64 mg/dL (ref 0.50–1.10)
Glucose, Bld: 111 mg/dL — ABNORMAL HIGH (ref 70–99)
Total Bilirubin: 0.3 mg/dL (ref 0.3–1.2)

## 2011-11-14 LAB — POCT URINALYSIS DIPSTICK
Bilirubin, UA: NEGATIVE
Glucose, UA: NEGATIVE
Spec Grav, UA: >=1.03
Urobilinogen, UA: 0.2

## 2011-11-14 LAB — CBC
HCT: 40.3 % (ref 36.0–46.0)
Hemoglobin: 12.8 g/dL (ref 12.0–15.0)
MCH: 27.5 pg (ref 26.0–34.0)
MCV: 86.5 fL (ref 78.0–100.0)
Platelets: 295 10*3/uL (ref 150–400)
RBC: 4.66 MIL/uL (ref 3.87–5.11)
WBC: 9 10*3/uL (ref 4.0–10.5)

## 2011-11-14 LAB — LIPID PANEL
HDL: 63 mg/dL (ref >39)
LDL Cholesterol: 128 mg/dL — ABNORMAL HIGH (ref 0–99)
Total CHOL/HDL Ratio: 3.5 Ratio
VLDL: 29 mg/dL (ref 0–40)

## 2011-11-14 LAB — TSH: TSH: 1.007 u[IU]/mL (ref 0.350–4.500)

## 2011-11-14 MED ORDER — DOXYCYCLINE HYCLATE 100 MG PO TABS: 100.0000 mg | ORAL_TABLET | Freq: Two times a day (BID) | ORAL | Status: AC

## 2011-11-14 MED ORDER — METHYLPREDNISOLONE ACETATE 80 MG/ML IJ SUSP
80.0000 mg | Freq: Once | INTRAMUSCULAR | Status: AC
  Administered 2011-11-14: 80 mg via INTRAMUSCULAR

## 2011-11-14 MED ORDER — LORAZEPAM 0.5 MG PO TABS: ORAL_TABLET | ORAL | Status: AC

## 2011-11-14 MED ORDER — CEFTRIAXONE SODIUM 1 G IJ SOLR
500.0000 mg | Freq: Once | INTRAMUSCULAR | Status: AC
Start: 2011-11-14 — End: 2011-11-14
  Administered 2011-11-14: 500 mg via INTRAMUSCULAR

## 2011-11-14 MED ORDER — KETOROLAC TROMETHAMINE 60 MG/2ML IJ SOLN
60.0000 mg | Freq: Once | INTRAMUSCULAR | Status: AC
  Administered 2011-11-14: 60 mg via INTRAMUSCULAR

## 2011-11-14 NOTE — Assessment & Plan Note (Signed)
2 week h/o billateral shoulder pain with reduced mobility

## 2011-11-14 NOTE — Patient Instructions (Signed)
F/u in 3 months.  Doxycycline antibiotic is prescribed for sinusitis and bronchitis, this will also treat the ear infection. You are being referred to [facility] to get assistance with coordinating your healthcare. MRI of your shoulders is ordered , the result will be sent to Dr Hilda Lias also.   CBC, chem 7, lipid, cmp and TSH and vit D today  Toradol, and depo medrol today for [pain. Rocephin for bronchitis

## 2011-11-14 NOTE — Progress Notes (Signed)
  Subjective:    Patient ID: [name], female    DOB: 16-Sep-1961, 51 y.o.   MRN: [id number]  HPI Cough , chills with sputum x 2 weeks 2 week h/o pain and reduced ROM of  Shoulders 2 week h/o urinary incontinence Right ear fulness     Review of Systems See HPI  Denies sinus pressure, nasal congestion, ear pain or sore throat.  Denies chest pains, palpitations and leg swelling Denies abdominal pain, nausea, vomiting,diarrhea or constipation.   Denies dysuria, frequency, hesitancy or incontinence. Denies headaches, seizures, numbness, or tingling. Denies depression, anxiety or insomnia. Denies skin break down or rash.        Objective:   Physical Exam Patient alert and oriented and in no cardiopulmonary distress.  HEENT: No facial asymmetry, EOMI,  sinus tenderness,  oropharynx pink and moist.  Neck supple no adenopathy.Right TM dull and erythematous  Chest: decreased air entry, scattered crackles and wheezes CVS: S1, S2 no murmurs, no S3.  ABD: Soft non tender. Bowel sounds normal.  Ext: No edema  Decreased : ROM  And , shoulders,and knees.  Skin: Intact, no ulcerations or rash noted.  Psych: Good eye contact, normal affect. Memory mildly impaired anxious or depressed appearing.  CNS: CN 2-12 intact, power, tone and sensation normal throughout.        Assessment & Plan:

## 2011-11-16 LAB — URINE CULTURE

## 2011-11-19 NOTE — Assessment & Plan Note (Signed)
Uncontrolled, injections administered in the office

## 2011-11-19 NOTE — Assessment & Plan Note (Signed)
--  stable and controlled ?

## 2011-11-19 NOTE — Assessment & Plan Note (Signed)
Acute infection , antibiotic prescribed 

## 2011-11-19 NOTE — Assessment & Plan Note (Signed)
Controlled, no change in medication  

## 2011-11-30 ENCOUNTER — Telehealth: Payer: Self-pay | Admitting: Family Medicine

## 2011-11-30 ENCOUNTER — Other Ambulatory Visit: Payer: Self-pay | Admitting: Family Medicine

## 2011-11-30 DIAGNOSIS — R32 Unspecified urinary incontinence: Secondary | ICD-10-CM

## 2011-11-30 MED ORDER — ALPRAZOLAM 1 MG PO TABS: 1.0000 mg | ORAL_TABLET | Freq: Two times a day (BID) | ORAL | Status: DC

## 2011-11-30 NOTE — Telephone Encounter (Signed)
Will refer to [facility]

## 2011-11-30 NOTE — Telephone Encounter (Signed)
Said if she doesn't have UTI she wants to see a urologist because she doesn't think its normal to keep having to run to the bathroom and sometimes not being able to go and sometimes having incontinence also

## 2011-11-30 NOTE — Telephone Encounter (Signed)
Pt should be referred to [facility] for assistance as far as her health care needs are concerned  Following their evaluation, pls refer her iff not done, if done pls see if you can get any f/u on this.  Let her know I will refer her to urology  Re urine complaints, I believe she ahs been in the past will check record

## 2011-12-07 ENCOUNTER — Ambulatory Visit (HOSPITAL_COMMUNITY): Payer: Medicare Other | Attending: Family Medicine

## 2011-12-07 ENCOUNTER — Ambulatory Visit (HOSPITAL_COMMUNITY): Admission: RE | Admit: 2011-12-07 | Payer: Medicare Other | Source: Ambulatory Visit

## 2011-12-17 ENCOUNTER — Other Ambulatory Visit: Payer: Self-pay | Admitting: Family Medicine

## 2011-12-18 ENCOUNTER — Telehealth: Payer: Self-pay | Admitting: Family Medicine

## 2011-12-19 ENCOUNTER — Other Ambulatory Visit: Payer: Self-pay

## 2011-12-19 DIAGNOSIS — R350 Frequency of micturition: Secondary | ICD-10-CM

## 2011-12-19 NOTE — Telephone Encounter (Signed)
Needs to have her urine checked for infection , she can submit specimen to lab for c/s, just send the order pls and explain

## 2011-12-19 NOTE — Telephone Encounter (Signed)
Spoke with pt and explained the need for urine specimen and why she will not be prescribed abt without a new specimen. Pt understanding and stated that she will submit specimen.

## 2011-12-19 NOTE — Telephone Encounter (Signed)
Wants a refill on doxycycline? Do you want to do this?

## 2011-12-26 ENCOUNTER — Telehealth: Payer: Self-pay | Admitting: Family Medicine

## 2011-12-26 ENCOUNTER — Ambulatory Visit: Payer: Medicare Other | Admitting: Infectious Diseases

## 2011-12-26 MED ORDER — ALPRAZOLAM 1 MG PO TABS: 1.0000 mg | ORAL_TABLET | Freq: Two times a day (BID) | ORAL | Status: DC

## 2011-12-26 MED ORDER — DIPHENOXYLATE-ATROPINE 2.5-0.025 MG PO TABS: 1.0000 | ORAL_TABLET | Freq: Four times a day (QID) | ORAL | Status: AC | PRN

## 2011-12-26 NOTE — Telephone Encounter (Signed)
Called and they said she only needed Nexium. Sent the xanax and lomotil that was requested also

## 2011-12-27 ENCOUNTER — Telehealth: Payer: Self-pay | Admitting: Family Medicine

## 2011-12-27 ENCOUNTER — Other Ambulatory Visit: Payer: Self-pay | Admitting: Family Medicine

## 2011-12-27 ENCOUNTER — Ambulatory Visit (HOSPITAL_COMMUNITY)
Admission: RE | Admit: 2011-12-27 | Discharge: 2011-12-27 | Disposition: A | Discharge status: Home / Self Care | Payer: Medicare Other | Source: Ambulatory Visit | Attending: Family Medicine | Admitting: Family Medicine

## 2011-12-27 DIAGNOSIS — M87029 Idiopathic aseptic necrosis of unspecified humerus: Secondary | ICD-10-CM | POA: Insufficient documentation

## 2011-12-27 DIAGNOSIS — J209 Acute bronchitis, unspecified: Secondary | ICD-10-CM

## 2011-12-27 DIAGNOSIS — M25519 Pain in unspecified shoulder: Secondary | ICD-10-CM | POA: Insufficient documentation

## 2011-12-27 DIAGNOSIS — M75100 Unspecified rotator cuff tear or rupture of unspecified shoulder, not specified as traumatic: Secondary | ICD-10-CM | POA: Insufficient documentation

## 2011-12-27 DIAGNOSIS — J40 Bronchitis, not specified as acute or chronic: Secondary | ICD-10-CM | POA: Insufficient documentation

## 2011-12-27 DIAGNOSIS — M25511 Pain in right shoulder: Secondary | ICD-10-CM

## 2011-12-27 DIAGNOSIS — M25619 Stiffness of unspecified shoulder, not elsewhere classified: Secondary | ICD-10-CM | POA: Insufficient documentation

## 2011-12-27 DIAGNOSIS — IMO0002 Reserved for concepts with insufficient information to code with codable children: Secondary | ICD-10-CM | POA: Insufficient documentation

## 2011-12-27 DIAGNOSIS — M25512 Pain in left shoulder: Secondary | ICD-10-CM

## 2011-12-27 NOTE — Telephone Encounter (Signed)
Say that the patient has no order for the chest xray and no one answered

## 2011-12-27 NOTE — Telephone Encounter (Signed)
error 

## 2011-12-31 ENCOUNTER — Encounter: Payer: Self-pay | Admitting: Family Medicine

## 2011-12-31 ENCOUNTER — Ambulatory Visit (INDEPENDENT_AMBULATORY_CARE_PROVIDER_SITE_OTHER): Payer: Medicare Other | Admitting: Family Medicine

## 2011-12-31 VITALS — BP 134/100 | HR 103 | Resp 16

## 2011-12-31 DIAGNOSIS — B3731 Acute candidiasis of vulva and vagina: Secondary | ICD-10-CM

## 2011-12-31 DIAGNOSIS — IMO0002 Reserved for concepts with insufficient information to code with codable children: Secondary | ICD-10-CM

## 2011-12-31 DIAGNOSIS — R32 Unspecified urinary incontinence: Secondary | ICD-10-CM

## 2011-12-31 DIAGNOSIS — B373 Candidiasis of vulva and vagina: Secondary | ICD-10-CM

## 2011-12-31 DIAGNOSIS — I10 Essential (primary) hypertension: Secondary | ICD-10-CM

## 2011-12-31 DIAGNOSIS — M25511 Pain in right shoulder: Secondary | ICD-10-CM

## 2011-12-31 DIAGNOSIS — J45909 Unspecified asthma, uncomplicated: Secondary | ICD-10-CM

## 2011-12-31 DIAGNOSIS — M25519 Pain in unspecified shoulder: Secondary | ICD-10-CM

## 2011-12-31 DIAGNOSIS — M549 Dorsalgia, unspecified: Secondary | ICD-10-CM

## 2011-12-31 MED ORDER — FLUCONAZOLE 150 MG PO TABS: ORAL_TABLET | ORAL | Status: AC

## 2011-12-31 MED ORDER — ALPRAZOLAM 1 MG PO TABS: 1.0000 mg | ORAL_TABLET | Freq: Two times a day (BID) | ORAL | Status: DC

## 2011-12-31 MED ORDER — BECLOMETHASONE DIPROPIONATE 40 MCG/ACT IN AERS: 2.0000 | INHALATION_SPRAY | Freq: Two times a day (BID) | RESPIRATORY_TRACT | Status: DC

## 2011-12-31 MED ORDER — KETOROLAC TROMETHAMINE 60 MG/2ML IM SOLN
60.0000 mg | Freq: Once | INTRAMUSCULAR | Status: AC
  Administered 2011-12-31: 60 mg via INTRAMUSCULAR

## 2011-12-31 MED ORDER — ALBUTEROL SULFATE HFA 108 (90 BASE) MCG/ACT IN AERS: 2.0000 | INHALATION_SPRAY | Freq: Four times a day (QID) | RESPIRATORY_TRACT | Status: DC | PRN

## 2011-12-31 MED ORDER — CLONIDINE HCL 0.1 MG PO TABS: ORAL_TABLET | ORAL | Status: DC

## 2011-12-31 NOTE — Assessment & Plan Note (Signed)
Uncontrolled toradol 60mg  im today

## 2011-12-31 NOTE — Patient Instructions (Addendum)
F/u in 6 weeks.  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  Please keep appt with urologist about excessive urination at night.  Flucanazole is sent in for you, since you had antibiotics and are itching.  Toradol 60mg  today for back pain  New additional medication at bedtime for blood pressure control, clonidine.  Discuss your concern about being checked for MS with Dr Gerilyn Pilgrim, that is his area of expertise.

## 2012-01-04 ENCOUNTER — Telehealth: Payer: Self-pay | Admitting: Family Medicine

## 2012-01-04 NOTE — Telephone Encounter (Signed)
[employer]. Needs to get nurse to stay for longer at a time because she is having more problems with her shoulders and back. They said she needs a letter from Dr explaining why she needs this. [place] [facility] they come everyday now but she needs them to stay for longer periods of time

## 2012-01-07 ENCOUNTER — Ambulatory Visit: Payer: Medicare Other | Admitting: Infectious Diseases

## 2012-01-07 ENCOUNTER — Telehealth: Payer: Self-pay | Admitting: *Deleted

## 2012-01-07 NOTE — Telephone Encounter (Signed)
I called & spoke with her. I transferred her to [facility] to reschedule the appt she missed today

## 2012-01-07 NOTE — Telephone Encounter (Signed)
Letter written, pls type, I will sign 

## 2012-01-08 NOTE — Telephone Encounter (Signed)
Letter written on bookshelf

## 2012-01-10 ENCOUNTER — Telehealth: Payer: Self-pay | Admitting: Family Medicine

## 2012-01-10 ENCOUNTER — Encounter: Payer: Self-pay | Admitting: Family Medicine

## 2012-01-10 NOTE — Telephone Encounter (Signed)
States she was constipated and took some sennokot and saw some blood in stool. Did say she had been straining. Advised to keep an eye out and if she saw it again to make an OV

## 2012-01-20 NOTE — Assessment & Plan Note (Signed)
Deteriorated with abn mRI shoulders, refer to ortho

## 2012-01-20 NOTE — Progress Notes (Signed)
  Subjective:    Patient ID: [name], female    DOB: 1960/11/05, 51 y.o.   MRN: [id number]  HPI The PT is here for follow up and re-evaluation of chronic medical conditions, medication management and review of any available recent lab and radiology data.  Preventive health is updated, specifically  Cancer screening and Immunization.   Questions or concerns regarding consultations or procedures which the PT has had in the interim are  addressed. The PT denies any adverse reactions to current medications since the last visit.  C/o worsening of the shoulder pains with limitation in mobility, begging for help, additional at home, unable to care for herself      Review of Systems See HPI Denies recent fever or chills. Denies sinus pressure, nasal congestion, ear pain or sore throat. Denies chest congestion, productive cough or wheezing. Denies chest pains, palpitations and leg swelling Denies abdominal pain, nausea, vomiting,diarrhea or constipation.   C/o severe incontinence, wants re eval by urology Denies headaches, seizures, numbness, or tingling. c/o depression, anxiety and poor sleep, due to uncontrolled pain Denies skin break down or rash. Concerned about the possibility of having Ms and wants this checked        Objective:   Physical Exam Patient alert and oriented and in no cardiopulmonary distress.Pt in pain  HEENT: No facial asymmetry, EOMI, no sinus tenderness,  oropharynx pink and moist.  Neck decreased ROM no adenopathy.  Chest: Clear to auscultation bilaterally.Decreased air entry throughout  CVS: S1, S2 no murmurs, no S3.  ABD: Soft non tender. Bowel sounds normal.  Ext: No edema  OZ:[id number]  ROM spine, and markedly reduced in shoulders  Skin: Intact, no ulcerations or rash noted.  Psych: Good eye contact, normal affect. Memory intact not anxious or depressed appearing.  CNS: CN 2-12 intact, power, tone and sensation normal  throughout.        Assessment & Plan:

## 2012-01-20 NOTE — Assessment & Plan Note (Signed)
Worsened, pt to re eval with urology

## 2012-01-20 NOTE — Assessment & Plan Note (Signed)
Controlled, no change in medication  

## 2012-01-20 NOTE — Assessment & Plan Note (Signed)
Chronically elevated diastolic blood pressure.no med change at this time

## 2012-01-30 ENCOUNTER — Ambulatory Visit: Payer: Medicare Other | Admitting: Infectious Diseases

## 2012-01-30 ENCOUNTER — Telehealth: Payer: Self-pay | Admitting: *Deleted

## 2012-01-30 NOTE — Telephone Encounter (Signed)
She forgot. Transferred to front to reschedule

## 2012-02-12 ENCOUNTER — Ambulatory Visit: Payer: Medicare Other | Admitting: Family Medicine

## 2012-02-14 ENCOUNTER — Telehealth: Payer: Self-pay | Admitting: Family Medicine

## 2012-02-14 NOTE — Telephone Encounter (Signed)
Wants to know if CAP paper has been filled out and sent back to Medicaid. She said needs it asap because she is going to be having surgery and can't be home alone

## 2012-02-15 NOTE — Telephone Encounter (Signed)
pls see if yopu have a copy of the last CAp on this pt, we have none back here, I have signed on one in the past approx 4 weeks, let her know

## 2012-02-18 ENCOUNTER — Telehealth: Payer: Self-pay | Admitting: Family Medicine

## 2012-02-19 ENCOUNTER — Ambulatory Visit: Payer: Medicare Other | Admitting: Urology

## 2012-02-22 ENCOUNTER — Other Ambulatory Visit: Payer: Self-pay | Admitting: Infectious Diseases

## 2012-02-29 NOTE — Telephone Encounter (Signed)
Already been sent back

## 2012-03-05 ENCOUNTER — Telehealth: Payer: Self-pay | Admitting: *Deleted

## 2012-03-05 NOTE — Telephone Encounter (Signed)
Pt requesting medical clearance for orthopedic surgery.  Last lab work 12/12, OV in Jan. 2013.  Was to have returned in 3 months.  No follow-up.  RN advised pt that she would need lab work and MD visit prior to MD providing medical clearance.  Pt disconnected phone prior to being transferred to scheduler.

## 2012-03-05 NOTE — Telephone Encounter (Signed)
She wants to be seen asap so she can get her shoulder operated on. Does not want to wait until 03/28/12 which is her md's 1st available appt. I told her we do not have anything else to offer. She agreed to come get labs right away & to take the appt. She thinks the md might be able to clear her by looking at the labs. I told her she would need to be seen & if any cancellations arise, I will call her. transferred to [facility] at the front to make both appts

## 2012-03-07 ENCOUNTER — Other Ambulatory Visit: Payer: Self-pay | Admitting: Infectious Diseases

## 2012-03-07 DIAGNOSIS — B20 Human immunodeficiency virus [HIV] disease: Secondary | ICD-10-CM

## 2012-03-11 ENCOUNTER — Ambulatory Visit: Payer: Medicare Other | Admitting: Family Medicine

## 2012-03-12 ENCOUNTER — Other Ambulatory Visit: Payer: Medicare Other

## 2012-03-13 ENCOUNTER — Telehealth: Payer: Self-pay | Admitting: Family Medicine

## 2012-03-13 ENCOUNTER — Other Ambulatory Visit: Payer: Self-pay | Admitting: Infectious Diseases

## 2012-03-13 ENCOUNTER — Telehealth: Payer: Self-pay | Admitting: *Deleted

## 2012-03-13 ENCOUNTER — Encounter: Payer: Self-pay | Admitting: Family Medicine

## 2012-03-13 ENCOUNTER — Ambulatory Visit (INDEPENDENT_AMBULATORY_CARE_PROVIDER_SITE_OTHER): Payer: Medicare Other | Admitting: Family Medicine

## 2012-03-13 VITALS — BP 126/98 | HR 96 | Resp 18 | Ht 64.0 in | Wt 180.0 lb

## 2012-03-13 DIAGNOSIS — M25569 Pain in unspecified knee: Secondary | ICD-10-CM

## 2012-03-13 DIAGNOSIS — B3731 Acute candidiasis of vulva and vagina: Secondary | ICD-10-CM

## 2012-03-13 DIAGNOSIS — L0390 Cellulitis, unspecified: Secondary | ICD-10-CM

## 2012-03-13 DIAGNOSIS — M25519 Pain in unspecified shoulder: Secondary | ICD-10-CM

## 2012-03-13 DIAGNOSIS — M25511 Pain in right shoulder: Secondary | ICD-10-CM

## 2012-03-13 DIAGNOSIS — I10 Essential (primary) hypertension: Secondary | ICD-10-CM

## 2012-03-13 DIAGNOSIS — IMO0002 Reserved for concepts with insufficient information to code with codable children: Secondary | ICD-10-CM

## 2012-03-13 DIAGNOSIS — R262 Difficulty in walking, not elsewhere classified: Secondary | ICD-10-CM

## 2012-03-13 DIAGNOSIS — L0291 Cutaneous abscess, unspecified: Secondary | ICD-10-CM

## 2012-03-13 DIAGNOSIS — B20 Human immunodeficiency virus [HIV] disease: Secondary | ICD-10-CM

## 2012-03-13 DIAGNOSIS — B373 Candidiasis of vulva and vagina: Secondary | ICD-10-CM

## 2012-03-13 DIAGNOSIS — N3000 Acute cystitis without hematuria: Secondary | ICD-10-CM

## 2012-03-13 DIAGNOSIS — Z21 Asymptomatic human immunodeficiency virus [HIV] infection status: Secondary | ICD-10-CM

## 2012-03-13 DIAGNOSIS — M25562 Pain in left knee: Secondary | ICD-10-CM

## 2012-03-13 MED ORDER — NYSTATIN 100000 UNIT/GM EX CREA: TOPICAL_CREAM | Freq: Two times a day (BID) | CUTANEOUS | Status: DC

## 2012-03-13 MED ORDER — CEPHALEXIN 500 MG PO CAPS: 500.0000 mg | ORAL_CAPSULE | Freq: Two times a day (BID) | ORAL | Status: AC

## 2012-03-13 MED ORDER — KETOROLAC TROMETHAMINE 60 MG/2ML IM SOLN
60.0000 mg | Freq: Once | INTRAMUSCULAR | Status: AC
  Administered 2012-03-13: 60 mg via INTRAMUSCULAR

## 2012-03-13 NOTE — Patient Instructions (Addendum)
Get the blood-work done  Take the antibiotics as prescribed  Take the nystatin cream  Shot given today  Get the labs done today  F/U Dr.Simpson in 4 weeks

## 2012-03-13 NOTE — Telephone Encounter (Signed)
Pt just wanted to make sure that Dr. Moshe Cipro office is aware that she had her requested labs drawn today.  Assured pt that they have access to the labs and will be notified of the results when available.

## 2012-03-13 NOTE — Telephone Encounter (Signed)
Pt is asking for rx for rolling walker.

## 2012-03-13 NOTE — Telephone Encounter (Signed)
Courtney spoke with patient

## 2012-03-13 NOTE — Telephone Encounter (Signed)
Called patient to reschedule her lab appt, she no showed.  She said she had them done at her PCP office and will have them faxed here. Wendall Mola CMA

## 2012-03-14 DIAGNOSIS — L0291 Cutaneous abscess, unspecified: Secondary | ICD-10-CM | POA: Insufficient documentation

## 2012-03-14 LAB — CBC WITH DIFFERENTIAL/PLATELET
Basophils Absolute: 0 10*3/uL (ref 0.0–0.1)
Basophils Absolute: 0 10*3/uL (ref 0.0–0.1)
Eosinophils Absolute: 0 10*3/uL (ref 0.0–0.7)
Lymphocytes Relative: 26 % (ref 12–46)
Lymphs Abs: 1.6 10*3/uL (ref 0.7–4.0)
Lymphs Abs: 1.6 10*3/uL (ref 0.7–4.0)
MCH: 28.5 pg (ref 26.0–34.0)
Neutro Abs: 4.3 10*3/uL (ref 1.7–7.7)
Neutrophils Relative %: 71 % (ref 43–77)
Neutrophils Relative %: 69 % (ref 43–77)
Platelets: 213 10*3/uL (ref 150–400)
Platelets: 227 10*3/uL (ref 150–400)
RBC: 4.4 MIL/uL (ref 3.87–5.11)
RBC: 4.45 MIL/uL (ref 3.87–5.11)
RDW: 14.2 % (ref 11.5–15.5)
RDW: 15.6 % — ABNORMAL HIGH (ref 11.5–15.5)
WBC: 6 10*3/uL (ref 4.0–10.5)
WBC: 6.2 10*3/uL (ref 4.0–10.5)

## 2012-03-14 LAB — T-HELPER CELLS (CD4) COUNT (NOT AT ARMC)
Absolute CD4: 335 /uL — ABNORMAL LOW (ref 381–1469)
CD4 T Helper %: 20 % — ABNORMAL LOW (ref 32–62)
Total Lymphocyte: 27 % (ref 12–46)
Total lymphocyte count: 1674 /uL (ref 700–3300)

## 2012-03-14 LAB — COMPREHENSIVE METABOLIC PANEL
ALT: 24 U/L (ref 0–35)
ALT: 23 U/L (ref 0–35)
AST: 36 U/L (ref 0–37)
AST: 36 U/L (ref 0–37)
Alkaline Phosphatase: 188 U/L — ABNORMAL HIGH (ref 39–117)
CO2: 23 mEq/L (ref 19–32)
CO2: 25 mEq/L (ref 19–32)
Calcium: 9.8 mg/dL (ref 8.4–10.5)
Chloride: 97 mEq/L (ref 96–112)
Sodium: 136 mEq/L (ref 135–145)
Sodium: 135 mEq/L (ref 135–145)
Total Bilirubin: 0.4 mg/dL (ref 0.3–1.2)
Total Protein: 8.2 g/dL (ref 6.0–8.3)
Total Protein: 8.3 g/dL (ref 6.0–8.3)

## 2012-03-14 NOTE — Assessment & Plan Note (Signed)
She is give any symptoms of acute cystitis with urinary pressure and frequency however she was not able to give me a urine sample. She will be placed on Bactrim for her axilla this will also cover urine

## 2012-03-14 NOTE — Assessment & Plan Note (Signed)
Chronic problem given toradol injection today followed by neurology, walker to be sent

## 2012-03-14 NOTE — Assessment & Plan Note (Signed)
Unchanged, given Toradol injection

## 2012-03-14 NOTE — Assessment & Plan Note (Signed)
I will have her go ahead and get her labs ordered by Dr.Hatcher, we will review these as well. Will send notice to see if he would have any concerns why she could not undergo surgery

## 2012-03-14 NOTE — Assessment & Plan Note (Signed)
Recurrent problem for pt, given nystatin cream she asked for this instead of diflucan tab

## 2012-03-14 NOTE — Assessment & Plan Note (Signed)
This appears to be a chronic problem and she is getting work-up for this. She states no diagnosis has been made

## 2012-03-14 NOTE — Assessment & Plan Note (Signed)
I believe she may have had an abscess or a small boil in her axilla that is now draining. I will start her on Bactrim twice a day.

## 2012-03-14 NOTE — Telephone Encounter (Signed)
WILL SEND SCRIPT

## 2012-03-14 NOTE — Assessment & Plan Note (Signed)
At goal.  

## 2012-03-14 NOTE — Assessment & Plan Note (Signed)
Pt here for clearance but I will defer to her PCP with her multiple co-morbidites and today she has acute infection.

## 2012-03-14 NOTE — Progress Notes (Signed)
  Subjective:    Patient ID: [name], female    DOB: 05-Jul-1961, 51 y.o.   MRN: [id number]  HPI  Pt presents with multiple concerns one of which the medical clearance for right shoulder surgery. She's had severe pain in her right shoulder and was sent to orthopedics for evaluation her PCP is not in the office today. I do not have any records regarding what the problem is in her shoulder and she cannot tell me. She states that they're going to fix her shoulder but she needs clearance. She's also been having drainage from her left axilla for the past month on and off. She says it smells like infection she also has vaginal itching but denies vaginal discharge. She states she does have some pressure when she urinates but denies dysuria. She has been following up with her infectious disease physician is due for labs. She has missed quite a few appointments with her PCP in a recent appointment with her infectious disease physician per the appointment scheduler in our system. She's had difficulty walking for some time now she has severe back pain which is followed by neurology Dr. Jerre Simon she is asking for a rolling walker today she states some days she is unable to get out of bed other day she can walk.   Pt unable to give urine sample Medications reviewed    Review of Systems - per above   GEN- + fatigue, fever, weight loss,weakness, recent illness HEENT- denies eye drainage, change in vision, nasal discharge, CVS- denies chest pain, palpitations RESP- denies SOB, cough, wheeze ABD- denies N/V, change in stools, abd pain GU- denies dysuria, hematuria, dribbling, incontinence,+ frequency MSK- + joint pain, muscle aches, injury Neuro- denies headache, dizziness, syncope, seizure activity      Objective:   Physical Exam GEN- NAD, alert and oriented x3, sitting in wheelchair- appears older than stated age HEENT- PERRL, EOMI, non injected sclera, pink conjunctiva, MMM, oropharynx  clear Neck- Supple, decreased ROM CVS- RRR, no murmur RESP-CTAB, initally o2 sat read 87%, thereafter 96% ABD-NABS,soft, NT,ND MSK- unable to lift right arm to shoulder level, decreased ROM right arm/shoulder, weakness in LE but equal bilat Gait- unstable gait, weak appearing EXT- No edema Pulses- Radial, DP- 2+ Skin- left axilla foul odor- pus noted in axilla with small opening on lateral edge of axilla with no discrete boil, erythema and irritation of axilla noted, no fluctuant areas       Assessment & Plan:

## 2012-03-17 LAB — HIV-1 RNA QUANT-NO REFLEX-BLD: HIV 1 RNA Quant: 2075 copies/mL — ABNORMAL HIGH (ref <20)

## 2012-03-19 ENCOUNTER — Telehealth: Payer: Self-pay | Admitting: Family Medicine

## 2012-03-19 NOTE — Telephone Encounter (Signed)
Is this ready?

## 2012-03-20 ENCOUNTER — Other Ambulatory Visit: Payer: Self-pay | Admitting: Family Medicine

## 2012-03-25 ENCOUNTER — Ambulatory Visit: Payer: Medicare Other | Admitting: Family Medicine

## 2012-03-28 ENCOUNTER — Ambulatory Visit: Payer: Medicare Other | Admitting: Infectious Diseases

## 2012-03-28 ENCOUNTER — Ambulatory Visit (INDEPENDENT_AMBULATORY_CARE_PROVIDER_SITE_OTHER): Payer: Medicare Other | Admitting: Family Medicine

## 2012-03-28 ENCOUNTER — Other Ambulatory Visit: Payer: Self-pay

## 2012-03-28 ENCOUNTER — Encounter: Payer: Self-pay | Admitting: Family Medicine

## 2012-03-28 VITALS — BP 130/90 | HR 108 | Resp 20 | Ht 64.0 in | Wt 183.0 lb

## 2012-03-28 DIAGNOSIS — F17200 Nicotine dependence, unspecified, uncomplicated: Secondary | ICD-10-CM

## 2012-03-28 DIAGNOSIS — R32 Unspecified urinary incontinence: Secondary | ICD-10-CM

## 2012-03-28 DIAGNOSIS — R9431 Abnormal electrocardiogram [ECG] [EKG]: Secondary | ICD-10-CM

## 2012-03-28 DIAGNOSIS — J45909 Unspecified asthma, uncomplicated: Secondary | ICD-10-CM

## 2012-03-28 DIAGNOSIS — I10 Essential (primary) hypertension: Secondary | ICD-10-CM

## 2012-03-28 DIAGNOSIS — J3089 Other allergic rhinitis: Secondary | ICD-10-CM

## 2012-03-28 DIAGNOSIS — N390 Urinary tract infection, site not specified: Secondary | ICD-10-CM

## 2012-03-28 DIAGNOSIS — L0291 Cutaneous abscess, unspecified: Secondary | ICD-10-CM

## 2012-03-28 DIAGNOSIS — B20 Human immunodeficiency virus [HIV] disease: Secondary | ICD-10-CM

## 2012-03-28 LAB — POCT URINALYSIS DIPSTICK
Glucose, UA: NEGATIVE
Ketones, UA: NEGATIVE
Nitrite, UA: NEGATIVE

## 2012-03-28 MED ORDER — CLONIDINE HCL 0.2 MG PO TABS: ORAL_TABLET | ORAL | Status: DC

## 2012-03-28 NOTE — Patient Instructions (Addendum)
F/u in 3 month  Urinalysis today .  You will be referred to cardiology, since you have an abnormal EKG to get medical clearance for your surgery on the right shoulder.  I will get appt sooner for you to see Dr Ninetta Lights before surgery date scheduled preferably   Increase clonidine to TWO 0.1mg  tabs at bedtime till done, NEW SCRIPT for 0.2mg  ONE at bedtime sent in, blood pressure is high  Toradol 60 mg IM in office today for pain

## 2012-03-28 NOTE — Assessment & Plan Note (Signed)
Increased fatigue with intermittent chest pain , abnormal EKG in smoker , here for med clearance, will refer to cardiology

## 2012-03-28 NOTE — Assessment & Plan Note (Signed)
Cessation counseling done 

## 2012-03-28 NOTE — Assessment & Plan Note (Signed)
Not currently experiencing uncontrolled symptoms

## 2012-03-28 NOTE — Assessment & Plan Note (Signed)
Resolved left axillary abcess

## 2012-03-28 NOTE — Assessment & Plan Note (Signed)
Being followed by urology, however urinalysis in office today abnormal, so sent for c/s

## 2012-03-28 NOTE — Progress Notes (Signed)
  Subjective:    Patient ID: [name], female    DOB: October 07, 1961, 51 y.o.   MRN: [id number]  HPI  Pt here for f/u of abces in left axilla , as well as for medical clearance for right shoulder surgery. Reports that pain and drainage from the armpit have subsided totally, denies fever or chills. Anxious to get cleared for surgery, very debilitated by pathology in both knees, which she is now being referred to a tertiary center for by her local ortho dos, and also by the shoulders.' Still smokes, c/o intermittent chest tightness and increased exertional fatigue in the past 3 to 4 months, though she is minimally active, uses a walker. Had recent labs for her HIV disease but needs f/u with ID also. She has  been keeping appointments with mental health and is doing fairly well  Review of Systems See HPI Denies recent fever or chills. Denies sinus pressure, nasal congestion, ear pain or sore throat. Denies chest congestion, productive cough or wheezing. Denies  palpitations and leg swelling Denies abdominal pain, nausea, vomiting,diarrhea or constipation.   Denies dysuria, frequency, hesitancy or incontinence.  Denies headaches, seizures, numbness, or tingling. Denies uncontrolled depression, anxiety or insomnia. Denies skin break down or rash.        Objective:   Physical Exam Patient alert and oriented and in no cardiopulmonary distress.  HEENT: No facial asymmetry, EOMI, no sinus tenderness,  oropharynx pink and moist.  Neck adequate ROM, no adenopathy.Poor dentition  Chest: Clear to auscultation bilaterally.Decreased though adequate air entry throughout No chest wall tenderness  CVS: S1, S2 no murmurs, no S3. EKG; NSR, possible anterior infarct  ABD: Soft non tender. Bowel sounds normal.  Ext: No edema  MS: Adequate ROM spine, and hips and reduced in shoulders and knees.  Skin: Intact, no ulcerations or rash noted.Left axilla inspected no sign of active  infection  Psych: Good eye contact, normal affect. Memory mildly impaired not anxious or depressed appearing.  CNS: CN 2-12 intact, power, normal throughout.        Assessment & Plan:

## 2012-03-28 NOTE — Assessment & Plan Note (Signed)
Persistent diastolic htn uncontrolled, increase dose of clonidine

## 2012-03-28 NOTE — Assessment & Plan Note (Signed)
Stable , no recent flare pt to use qvar daily

## 2012-03-28 NOTE — Assessment & Plan Note (Signed)
Needs re assement in clinic, has labs from 02/2012. Uncertain if any specific directions will be provided by ID for upcoming right shoulder surgery, will attempt to send dr Ninetta Lights a direct message and also to get clinic date pre surgery

## 2012-03-31 ENCOUNTER — Telehealth: Payer: Self-pay | Admitting: Family Medicine

## 2012-03-31 ENCOUNTER — Other Ambulatory Visit: Payer: Self-pay

## 2012-03-31 MED ORDER — LEVOFLOXACIN 500 MG PO TABS: 500.0000 mg | ORAL_TABLET | Freq: Every day | ORAL | Status: AC

## 2012-03-31 NOTE — Telephone Encounter (Signed)
pls let pt know she needs appointment with me with her medication she has at home taking. I have a long list of medications recently done through [facility] which shows a lot of duplication in medication, she NEEDS to bring the medication so this can be addressed as too much medication may be  dangerous

## 2012-04-01 ENCOUNTER — Telehealth: Payer: Self-pay | Admitting: Family Medicine

## 2012-04-01 NOTE — Telephone Encounter (Signed)
Per Dr Lodema Hong, she needs appt with her and bring ALL her meds. She has a paper to fill out and the meds are wrong because she never brings them in2

## 2012-04-02 LAB — URINE CULTURE

## 2012-04-04 NOTE — Telephone Encounter (Signed)
Left a message for pt to call back

## 2012-04-07 ENCOUNTER — Telehealth: Payer: Self-pay | Admitting: Family Medicine

## 2012-04-08 ENCOUNTER — Telehealth: Payer: Self-pay | Admitting: Family Medicine

## 2012-04-08 NOTE — Telephone Encounter (Signed)
Spoke with pt and discussed reviewing meds.

## 2012-04-08 NOTE — Telephone Encounter (Signed)
Spoke with pt and she stated that she will bring meds by on 04/09/12

## 2012-04-09 ENCOUNTER — Encounter: Payer: Self-pay | Admitting: Cardiovascular Disease

## 2012-04-09 ENCOUNTER — Ambulatory Visit (INDEPENDENT_AMBULATORY_CARE_PROVIDER_SITE_OTHER): Payer: Medicare Other | Admitting: Cardiovascular Disease

## 2012-04-09 VITALS — BP 161/103 | HR 101 | Resp 18 | Ht 64.0 in | Wt 184.0 lb

## 2012-04-09 DIAGNOSIS — J209 Acute bronchitis, unspecified: Secondary | ICD-10-CM

## 2012-04-09 DIAGNOSIS — Z21 Asymptomatic human immunodeficiency virus [HIV] infection status: Secondary | ICD-10-CM

## 2012-04-09 DIAGNOSIS — R0989 Other specified symptoms and signs involving the circulatory and respiratory systems: Secondary | ICD-10-CM

## 2012-04-09 DIAGNOSIS — I10 Essential (primary) hypertension: Secondary | ICD-10-CM

## 2012-04-09 DIAGNOSIS — R0600 Dyspnea, unspecified: Secondary | ICD-10-CM

## 2012-04-09 DIAGNOSIS — B20 Human immunodeficiency virus [HIV] disease: Secondary | ICD-10-CM

## 2012-04-09 DIAGNOSIS — R9431 Abnormal electrocardiogram [ECG] [EKG]: Secondary | ICD-10-CM

## 2012-04-09 NOTE — Progress Notes (Signed)
Patient ID: [name], female   DOB: [birth year], 51 y.o.   MRN: [id number] 51 yo referred by Dr Lodema Hong for preop clearence and abnormal ECG.  She has no history of CAD.  She has had previous arthroscopic knee surgery on both legs.  Still has not recovered and using walker.  Also hurt her right shoulder and needs "surgery" for this.  Not clear if she will have this at [facility] or [facility] [place].  She smokes 1/2ppd  No attemps to quit.  Stressed.  Counseled for less than 10 minutes.  She gets occasional "asthma".  No chest pain but some exertional dyspnea.  Activity limited by orthopedic issues.  Compliant with meds.  Does not work last 2 years.  Occasional ETOH.  Has HIV and sees behavioral health.    ROS: Denies fever, malais, weight loss, blurry vision, decreased visual acuity, cough, sputum, SOB, hemoptysis, pleuritic pain, palpitaitons, heartburn, abdominal pain, melena, lower extremity edema, claudication, or rash.  All other systems reviewed and negative   General: Affect appropriate Chronically ill black female HEENT: normal Neck supple with no adenopathy JVP normal no bruits no thyromegaly Lungs clear with no wheezing and good diaphragmatic motion Heart:  S1/S2 no murmur,rub, gallop or click PMI normal Abdomen: benighn, BS positve, no tenderness, no AAA no bruit.  No HSM or HJR Distal pulses intact with no bruits Plus one bilateral  edema Neuro non-focal Skin warm and dry No muscular weakness decrease ROM RUE  Medications Current Outpatient Prescriptions  Medication Sig Dispense Refill  . albuterol (VENTOLIN HFA) 108 (90 BASE) MCG/ACT inhaler Inhale 2 puffs into the lungs every 6 (six) hours as needed. 2 puffs every 4-6 hours as needed for shortness of breath  1 Inhaler  3  . ALPRAZolam (XANAX) 1 MG tablet Take 1 tablet (1 mg total) by mouth 2 (two) times daily. Take 1 tablet by mouth two times a day as needed  60 tablet  1  . beclomethasone (QVAR) 40 MCG/ACT inhaler  Inhale 2 puffs into the lungs 2 (two) times daily. 2 puffs two times a day  1 Inhaler  3  . cephALEXin (KEFLEX) 500 MG capsule Take 500 mg by mouth 2 (two) times daily.      . cloNIDine (CATAPRES) 0.1 MG tablet Take 0.1 mg by mouth daily.      . Cyanocobalamin (VITAMIN B-12 IJ) Inject as directed every 30 (thirty) days.      . cyclobenzaprine (FLEXERIL) 10 MG tablet Take 10 mg by mouth 3 (three) times daily as needed.      . darunavir (PREZISTA) 400 MG tablet Take 2 tablets (800 mg total) by mouth once.  180 tablet  3  . diclofenac (VOLTAREN) 75 MG EC tablet Take 75 mg by mouth 2 (two) times daily.      . divalproex (DEPAKOTE) 250 MG DR tablet 1 in AM and 1 at HS      . emtricitabine-tenofovir (TRUVADA) 200-300 MG per tablet Take 1 tablet by mouth daily.  90 tablet  3  . esomeprazole (NEXIUM) 40 MG capsule Take 1 capsule (40 mg total) by mouth daily before breakfast. Take one capsule by mouth once a day  90 capsule  1  . folic acid (FOLVITE) 1 MG tablet Take 1 mg by mouth daily.        . indomethacin (INDOCIN) 25 MG capsule Take 25 mg by mouth 2 (two) times daily with a meal.      . levofloxacin (LEVAQUIN)  500 MG tablet Take 1 tablet (500 mg total) by mouth daily.  5 tablet  0  . PHENERGAN 25 MG tablet TAKE (1) TABLET BY MOUTH TWICE A DAY AS NEEDED FOR NAUSEA.  20 each  3  . predniSONE (DELTASONE) 10 MG tablet Take 10 mg by mouth as directed.      . raltegravir (ISENTRESS) 400 MG tablet Take 1 tablet (400 mg total) by mouth 2 (two) times daily.  180 tablet  3  . ritonavir (NORVIR) 100 MG capsule Take 1 capsule (100 mg total) by mouth daily.  100 each  3  . verapamil (CALAN-SR) 240 MG CR tablet TAKE 1 TABLET DAILY  30 tablet  3  . Vitamin D, Ergocalciferol, (DRISDOL) 50000 UNITS CAPS Take 50,000 Units by mouth.        . DISCONTD: divalproex (DEPAKOTE) 250 MG DR tablet 1 in AM and 2 at HS  90 tablet  0  . DISCONTD: QUEtiapine (SEROQUEL) 100 MG tablet Take 1 tablet (100 mg total) by mouth at  bedtime. Take one tablet by mouth at bedtime        Allergies Aspirin and Sulfonamide derivatives  Family History: Family History  Problem Relation Age of Onset  . Diabetes      Family History  . Hypertension      Family history   . Hypertension Mother   . Diabetes Mother   . Diabetes Father   . Cancer Sister 71    x2 (liver)  . Diabetes Brother     Social History: History   Social History  . Marital Status: Legally Separated    Spouse Name: N/A    Number of Children: N/A  . Years of Education: N/A   Occupational History  . Not on file.   Social History Main Topics  . Smoking status: Current Everyday Smoker -- 1.0 packs/day    Types: Cigarettes  . Smokeless tobacco: Never Used  . Alcohol Use: 0.5 oz/week    1 drink(s) per week     beer or wine  . Drug Use: No  . Sexually Active: Yes -- Female partner(s)     pt. given condoms   Other Topics Concern  . Not on file   Social History Narrative  . No narrative on file    Electrocardiogram:  NSR QS in lead V12 likely from lead placement minor change from 08/02/11   Assessment and Plan

## 2012-04-09 NOTE — Assessment & Plan Note (Signed)
ECG looks ok to me.  She is large breasted and QS in lead V12 is from lead placement.  Will do echo to make sure EF normal given dyspnea and asthma although I think this is from smoking.  Low risk shoulder surgery and no chest pain on my interview.  Does not need stress test to clear for surgery. Clear so long as echo is normal

## 2012-04-09 NOTE — Assessment & Plan Note (Signed)
Dyspnea Needs preop CXR  Continue inhaler  Echo to assess RV/LV function

## 2012-04-09 NOTE — Assessment & Plan Note (Signed)
Has had axillary abscess  F/U CD4 count and consider referall to ID per primary

## 2012-04-09 NOTE — Patient Instructions (Addendum)
**Note De-identified  Obfuscation** Your physician has requested that you have an echocardiogram. Echocardiography is a painless test that uses sound waves to create images of your heart. It provides your doctor with information about the size and shape of your heart and how well your heart's chambers and valves are working. This procedure takes approximately one hour. There are no restrictions for this procedure.  Your physician recommends that you schedule a follow-up appointment in: as needed  

## 2012-04-09 NOTE — Assessment & Plan Note (Signed)
Well controlled.  Continue current medications and low sodium Dash type diet.    

## 2012-04-11 ENCOUNTER — Ambulatory Visit (HOSPITAL_COMMUNITY)
Admission: RE | Admit: 2012-04-11 | Discharge: 2012-04-11 | Disposition: A | Discharge status: Home / Self Care | Payer: Medicare Other | Source: Ambulatory Visit | Attending: Cardiovascular Disease | Admitting: Cardiovascular Disease

## 2012-04-11 DIAGNOSIS — F17200 Nicotine dependence, unspecified, uncomplicated: Secondary | ICD-10-CM | POA: Insufficient documentation

## 2012-04-11 DIAGNOSIS — R079 Chest pain, unspecified: Secondary | ICD-10-CM | POA: Insufficient documentation

## 2012-04-11 DIAGNOSIS — R0989 Other specified symptoms and signs involving the circulatory and respiratory systems: Secondary | ICD-10-CM | POA: Insufficient documentation

## 2012-04-11 DIAGNOSIS — R0609 Other forms of dyspnea: Secondary | ICD-10-CM | POA: Insufficient documentation

## 2012-04-11 DIAGNOSIS — R9431 Abnormal electrocardiogram [ECG] [EKG]: Secondary | ICD-10-CM | POA: Insufficient documentation

## 2012-04-11 DIAGNOSIS — R0600 Dyspnea, unspecified: Secondary | ICD-10-CM

## 2012-04-11 DIAGNOSIS — I10 Essential (primary) hypertension: Secondary | ICD-10-CM | POA: Insufficient documentation

## 2012-04-11 DIAGNOSIS — I517 Cardiomegaly: Secondary | ICD-10-CM

## 2012-04-11 NOTE — Progress Notes (Signed)
*  PRELIMINARY RESULTS* Echocardiogram 2D Echocardiogram has been performed.  [name] 04/11/2012, 10:47 AM

## 2012-04-14 ENCOUNTER — Encounter: Payer: Self-pay | Admitting: Family Medicine

## 2012-04-14 ENCOUNTER — Telehealth: Payer: Self-pay | Admitting: Family Medicine

## 2012-04-15 ENCOUNTER — Telehealth: Payer: Self-pay | Admitting: Family Medicine

## 2012-04-15 ENCOUNTER — Telehealth: Payer: Self-pay | Admitting: Cardiovascular Disease

## 2012-04-15 NOTE — Telephone Encounter (Signed)
Patient wants results of Echo done last week. / tg

## 2012-04-16 ENCOUNTER — Telehealth: Payer: Self-pay | Admitting: Family Medicine

## 2012-04-16 NOTE — Telephone Encounter (Signed)
Aware she needs to contact cardio and come in and bring her actual meds so we can see what she is taking

## 2012-04-16 NOTE — Telephone Encounter (Signed)
Pt aware.

## 2012-04-16 NOTE — Telephone Encounter (Signed)
**Note De-Identified  Obfuscation** Per Dr. Eden Emms echo is normal, pt. Advised./LV

## 2012-04-17 ENCOUNTER — Telehealth: Payer: Self-pay | Admitting: Family Medicine

## 2012-04-17 NOTE — Telephone Encounter (Signed)
pls letpt know I have signed off on her medical and cardiac clearance and fax papers to dr Dion Saucier

## 2012-04-17 NOTE — Telephone Encounter (Signed)
Pt aware.

## 2012-04-17 NOTE — Telephone Encounter (Signed)
Spoke with pt and she stated that she has been cleared for surgery from cardiology.  She is waiting for clearance from this office in order to have her surgery.

## 2012-04-17 NOTE — Telephone Encounter (Signed)
Noted  

## 2012-04-23 ENCOUNTER — Encounter (HOSPITAL_COMMUNITY): Payer: Self-pay | Admitting: Pharmacy Technician

## 2012-04-27 ENCOUNTER — Other Ambulatory Visit: Payer: Self-pay | Admitting: Infectious Diseases

## 2012-04-28 DIAGNOSIS — B20 Human immunodeficiency virus [HIV] disease: Secondary | ICD-10-CM

## 2012-04-28 DIAGNOSIS — R32 Unspecified urinary incontinence: Secondary | ICD-10-CM

## 2012-04-28 DIAGNOSIS — K219 Gastro-esophageal reflux disease without esophagitis: Secondary | ICD-10-CM

## 2012-04-28 DIAGNOSIS — F319 Bipolar disorder, unspecified: Secondary | ICD-10-CM

## 2012-04-28 DIAGNOSIS — J45909 Unspecified asthma, uncomplicated: Secondary | ICD-10-CM

## 2012-05-01 ENCOUNTER — Inpatient Hospital Stay (HOSPITAL_COMMUNITY): Admission: RE | Admit: 2012-05-01 | Discharge: 2012-05-01 | Payer: Medicare Other | Source: Ambulatory Visit

## 2012-05-01 ENCOUNTER — Other Ambulatory Visit: Payer: Self-pay | Admitting: Orthopedic Surgery

## 2012-05-01 NOTE — Pre-Procedure Instructions (Signed)
20 [name]  05/01/2012   Your procedure is scheduled on:  May 06, 2012 (Tuesday)  Report to [facility] at 5:30 AM.  Call this number if you have problems the morning of surgery: [phone]   Remember:   Do not eat food:After Midnight.    Take these medicines the morning of surgery with A SIP OF WATER:  Verapamil, inhalers as needed, xanax as needed, catapres, flexeril as needed, prezista, depakote, nexium, isentress, phenergan as needed, norvir, truvada   Do not wear jewelry, make-up or nail polish.  Do not wear lotions, powders, or perfumes. You may wear deodorant.  Do not shave 48 hours prior to surgery. Men may shave face and neck.  Do not bring valuables to the hospital.  Contacts, dentures or bridgework may not be worn into surgery.  Leave suitcase in the car. After surgery it may be brought to your room.  For patients admitted to the hospital, checkout time is 11:00 AM the day of discharge.   Patients discharged the day of surgery will not be allowed to drive home.  Name and phone number of your driver:   Special Instructions: CHG Shower Use Special Wash: 1/2 bottle night before surgery and 1/2 bottle morning of surgery.   Please read over the following fact sheets that you were given: Pain Booklet, Coughing and Deep Breathing, Blood Transfusion Information and Surgical Site Infection Prevention

## 2012-05-02 ENCOUNTER — Other Ambulatory Visit: Payer: Self-pay | Admitting: Neurology

## 2012-05-05 ENCOUNTER — Encounter (HOSPITAL_COMMUNITY)
Admission: RE | Admit: 2012-05-05 | Discharge: 2012-05-05 | Disposition: A | Discharge status: Home / Self Care | Payer: Medicare Other | Source: Ambulatory Visit | Attending: Orthopedic Surgery | Admitting: Orthopedic Surgery

## 2012-05-05 ENCOUNTER — Ambulatory Visit (HOSPITAL_COMMUNITY)
Admission: RE | Admit: 2012-05-05 | Discharge: 2012-05-05 | Disposition: A | Discharge status: Home / Self Care | Payer: Medicare Other | Source: Ambulatory Visit | Attending: Orthopedic Surgery | Admitting: Orthopedic Surgery

## 2012-05-05 ENCOUNTER — Encounter (HOSPITAL_COMMUNITY): Payer: Self-pay

## 2012-05-05 DIAGNOSIS — Z01811 Encounter for preprocedural respiratory examination: Secondary | ICD-10-CM | POA: Insufficient documentation

## 2012-05-05 DIAGNOSIS — J9819 Other pulmonary collapse: Secondary | ICD-10-CM | POA: Insufficient documentation

## 2012-05-05 DIAGNOSIS — Z01812 Encounter for preprocedural laboratory examination: Secondary | ICD-10-CM | POA: Insufficient documentation

## 2012-05-05 DIAGNOSIS — M87059 Idiopathic aseptic necrosis of unspecified femur: Secondary | ICD-10-CM | POA: Insufficient documentation

## 2012-05-05 HISTORY — DX: Unspecified urinary incontinence: R32

## 2012-05-05 HISTORY — DX: Pain in unspecified joint: M25.50

## 2012-05-05 HISTORY — DX: Dorsalgia, unspecified: M54.9

## 2012-05-05 HISTORY — DX: Dizziness and giddiness: R42

## 2012-05-05 HISTORY — DX: Effusion, unspecified joint: M25.40

## 2012-05-05 HISTORY — DX: Gastric ulcer, unspecified as acute or chronic, without hemorrhage or perforation: K25.9

## 2012-05-05 HISTORY — DX: Spontaneous ecchymoses: R23.3

## 2012-05-05 HISTORY — DX: Nocturia: R35.1

## 2012-05-05 HISTORY — DX: Other chronic pain: G89.29

## 2012-05-05 HISTORY — DX: Unspecified osteoarthritis, unspecified site: M19.90

## 2012-05-05 HISTORY — DX: Other skin changes: R23.8

## 2012-05-05 HISTORY — DX: Depression, unspecified: F32.A

## 2012-05-05 HISTORY — DX: Pneumonia, unspecified organism: J18.9

## 2012-05-05 HISTORY — DX: Emphysema, unspecified: J43.9

## 2012-05-05 HISTORY — DX: Major depressive disorder, single episode, unspecified: F32.9

## 2012-05-05 HISTORY — DX: Personal history of other infectious and parasitic diseases: Z86.19

## 2012-05-05 HISTORY — DX: Frequency of micturition: R35.0

## 2012-05-05 HISTORY — DX: Personal history of other medical treatment: Z92.89

## 2012-05-05 HISTORY — DX: Unspecified cataract: H26.9

## 2012-05-05 HISTORY — DX: Anxiety disorder, unspecified: F41.9

## 2012-05-05 HISTORY — DX: Headache: R51

## 2012-05-05 HISTORY — DX: Other constipation: K59.09

## 2012-05-05 LAB — CBC
HCT: 36.6 % (ref 36.0–46.0)
Hemoglobin: 12.2 g/dL (ref 12.0–15.0)
MCH: 29.5 pg (ref 26.0–34.0)
MCV: 88.4 fL (ref 78.0–100.0)
Platelets: 199 10*3/uL (ref 150–400)
RBC: 4.14 MIL/uL (ref 3.87–5.11)
WBC: 7.3 10*3/uL (ref 4.0–10.5)

## 2012-05-05 LAB — COMPREHENSIVE METABOLIC PANEL
ALT: 19 U/L (ref 0–35)
AST: 30 U/L (ref 0–37)
CO2: 23 mEq/L (ref 19–32)
Calcium: 9.3 mg/dL (ref 8.4–10.5)
Creatinine, Ser: 0.51 mg/dL (ref 0.50–1.10)
GFR calc non Af Amer: >90 mL/min (ref >90)
Sodium: 135 mEq/L (ref 135–145)
Total Protein: 8.1 g/dL (ref 6.0–8.3)

## 2012-05-05 LAB — SURGICAL PCR SCREEN
MRSA, PCR: NEGATIVE
Staphylococcus aureus: NEGATIVE

## 2012-05-05 LAB — TYPE AND SCREEN
ABO/RH(D): A POS
Antibody Screen: NEGATIVE

## 2012-05-05 LAB — ABO/RH: ABO/RH(D): A POS

## 2012-05-05 MED ORDER — CEFAZOLIN SODIUM-DEXTROSE 2-3 GM-% IV SOLR
2.0000 g | INTRAVENOUS | Status: AC
  Administered 2012-05-06: 2 g via INTRAVENOUS
  Filled 2012-05-05: qty 50

## 2012-05-05 NOTE — Progress Notes (Addendum)
Dr. Feliberto Gottron notified of chest x-ray results- "recurrent atelectasis"  No new orders given.G

## 2012-05-05 NOTE — Progress Notes (Addendum)
Cardiologist is Dr.Nishan with clearance note in epic  Denies ever having a stress test Echo done with results in epic from 04/11/12 Denies ever having a heart cath  Dr.Margaret Lodema Hong is medical MD  EKG from 03/28/12 Denies recent CXR

## 2012-05-06 ENCOUNTER — Encounter (HOSPITAL_COMMUNITY): Payer: Self-pay | Admitting: Certified Registered"

## 2012-05-06 ENCOUNTER — Ambulatory Visit (HOSPITAL_COMMUNITY): Payer: Medicare Other | Admitting: Certified Registered"

## 2012-05-06 ENCOUNTER — Inpatient Hospital Stay (HOSPITAL_COMMUNITY)
Admission: RE | Admit: 2012-05-06 | Discharge: 2012-05-09 | DRG: 484 | Disposition: A | Discharge status: Home / Self Care | Payer: Medicare Other | Source: Ambulatory Visit | Attending: Orthopedic Surgery | Admitting: Orthopedic Surgery

## 2012-05-06 ENCOUNTER — Inpatient Hospital Stay (HOSPITAL_COMMUNITY): Payer: Medicare Other

## 2012-05-06 ENCOUNTER — Encounter (HOSPITAL_COMMUNITY)
Admission: RE | Disposition: A | Discharge status: Home / Self Care | Payer: Self-pay | Source: Ambulatory Visit | Attending: Orthopedic Surgery

## 2012-05-06 ENCOUNTER — Encounter (HOSPITAL_COMMUNITY): Payer: Self-pay

## 2012-05-06 ENCOUNTER — Encounter (HOSPITAL_COMMUNITY): Payer: Self-pay | Admitting: Orthopedic Surgery

## 2012-05-06 DIAGNOSIS — T375X5A Adverse effect of antiviral drugs, initial encounter: Secondary | ICD-10-CM | POA: Diagnosis present

## 2012-05-06 DIAGNOSIS — J438 Other emphysema: Secondary | ICD-10-CM | POA: Diagnosis present

## 2012-05-06 DIAGNOSIS — F319 Bipolar disorder, unspecified: Secondary | ICD-10-CM | POA: Diagnosis present

## 2012-05-06 DIAGNOSIS — K219 Gastro-esophageal reflux disease without esophagitis: Secondary | ICD-10-CM | POA: Diagnosis present

## 2012-05-06 DIAGNOSIS — Z01812 Encounter for preprocedural laboratory examination: Secondary | ICD-10-CM

## 2012-05-06 DIAGNOSIS — M8710 Osteonecrosis due to drugs, unspecified bone: Secondary | ICD-10-CM

## 2012-05-06 DIAGNOSIS — T50905A Adverse effect of unspecified drugs, medicaments and biological substances, initial encounter: Secondary | ICD-10-CM

## 2012-05-06 DIAGNOSIS — F17200 Nicotine dependence, unspecified, uncomplicated: Secondary | ICD-10-CM | POA: Diagnosis present

## 2012-05-06 DIAGNOSIS — M8708 Idiopathic aseptic necrosis of bone, other site: Principal | ICD-10-CM | POA: Diagnosis present

## 2012-05-06 DIAGNOSIS — J45909 Unspecified asthma, uncomplicated: Secondary | ICD-10-CM | POA: Diagnosis present

## 2012-05-06 DIAGNOSIS — Z21 Asymptomatic human immunodeficiency virus [HIV] infection status: Secondary | ICD-10-CM | POA: Diagnosis present

## 2012-05-06 DIAGNOSIS — F341 Dysthymic disorder: Secondary | ICD-10-CM | POA: Diagnosis present

## 2012-05-06 DIAGNOSIS — Z01818 Encounter for other preprocedural examination: Secondary | ICD-10-CM

## 2012-05-06 DIAGNOSIS — K5900 Constipation, unspecified: Secondary | ICD-10-CM | POA: Diagnosis present

## 2012-05-06 HISTORY — DX: Adverse effect of unspecified drugs, medicaments and biological substances, initial encounter: T50.905A

## 2012-05-06 HISTORY — DX: Osteonecrosis due to drugs, unspecified bone: M87.10

## 2012-05-06 HISTORY — PX: SHOULDER HEMI-ARTHROPLASTY: SHX5049

## 2012-05-06 SURGERY — HEMIARTHROPLASTY, SHOULDER
Anesthesia: General | Site: Shoulder | Laterality: Right | Wound class: Clean

## 2012-05-06 MED ORDER — METHOCARBAMOL 500 MG PO TABS
500.0000 mg | ORAL_TABLET | Freq: Four times a day (QID) | ORAL | Status: DC | PRN
  Administered 2012-05-08: 500 mg via ORAL
  Filled 2012-05-06: qty 1

## 2012-05-06 MED ORDER — CYCLOBENZAPRINE HCL 10 MG PO TABS
10.0000 mg | ORAL_TABLET | Freq: Three times a day (TID) | ORAL | Status: DC | PRN
  Administered 2012-05-06 – 2012-05-09 (×3): 10 mg via ORAL
  Filled 2012-05-06 (×3): qty 1

## 2012-05-06 MED ORDER — ZOLPIDEM TARTRATE 5 MG PO TABS: 5.0000 mg | ORAL_TABLET | Freq: Every evening | ORAL | Status: DC | PRN

## 2012-05-06 MED ORDER — ACETAMINOPHEN 650 MG RE SUPP: 650.0000 mg | Freq: Four times a day (QID) | RECTAL | Status: DC | PRN

## 2012-05-06 MED ORDER — SODIUM CHLORIDE 0.9 % IR SOLN
Status: DC | PRN
  Administered 2012-05-06: 1000 mL

## 2012-05-06 MED ORDER — BUPIVACAINE HCL (PF) 0.25 % IJ SOLN
INTRAMUSCULAR | Status: AC
  Filled 2012-05-06: qty 30

## 2012-05-06 MED ORDER — PROPOFOL 10 MG/ML IV EMUL
INTRAVENOUS | Status: DC | PRN
  Administered 2012-05-06: 150 mg via INTRAVENOUS

## 2012-05-06 MED ORDER — MENTHOL 3 MG MT LOZG: 1.0000 | LOZENGE | OROMUCOSAL | Status: DC | PRN

## 2012-05-06 MED ORDER — HYDROMORPHONE HCL PF 1 MG/ML IJ SOLN
0.5000 mg | INTRAMUSCULAR | Status: DC | PRN
Start: 2012-05-06 — End: 2012-05-09
  Administered 2012-05-06: 1 mg via INTRAVENOUS
  Filled 2012-05-06: qty 1

## 2012-05-06 MED ORDER — DIAZEPAM 5 MG PO TABS
5.0000 mg | ORAL_TABLET | Freq: Four times a day (QID) | ORAL | Status: DC | PRN
  Administered 2012-05-09: 5 mg via ORAL
  Filled 2012-05-06: qty 1

## 2012-05-06 MED ORDER — EMTRICITABINE-TENOFOVIR DF 200-300 MG PO TABS
1.0000 | ORAL_TABLET | Freq: Every day | ORAL | Status: DC
  Administered 2012-05-07 – 2012-05-09 (×3): 1 via ORAL
  Filled 2012-05-06 (×3): qty 1

## 2012-05-06 MED ORDER — PROMETHAZINE HCL 25 MG/ML IJ SOLN: 6.2500 mg | INTRAMUSCULAR | Status: DC | PRN

## 2012-05-06 MED ORDER — PREDNISONE 10 MG PO TABS: 10.0000 mg | ORAL_TABLET | ORAL | Status: DC

## 2012-05-06 MED ORDER — OXYCODONE-ACETAMINOPHEN 5-325 MG PO TABS
1.0000 | ORAL_TABLET | ORAL | Status: DC | PRN
  Administered 2012-05-06 – 2012-05-09 (×4): 2 via ORAL
  Filled 2012-05-06 (×4): qty 2

## 2012-05-06 MED ORDER — ALBUTEROL SULFATE HFA 108 (90 BASE) MCG/ACT IN AERS: 2.0000 | INHALATION_SPRAY | Freq: Four times a day (QID) | RESPIRATORY_TRACT | Status: DC | PRN

## 2012-05-06 MED ORDER — HYDROMORPHONE HCL PF 1 MG/ML IJ SOLN
0.2500 mg | INTRAMUSCULAR | Status: DC | PRN
  Administered 2012-05-06 (×2): 0.5 mg via INTRAVENOUS

## 2012-05-06 MED ORDER — MEPERIDINE HCL 25 MG/ML IJ SOLN: 6.2500 mg | INTRAMUSCULAR | Status: DC | PRN

## 2012-05-06 MED ORDER — DARUNAVIR ETHANOLATE 800 MG PO TABS
800.0000 mg | ORAL_TABLET | Freq: Every day | ORAL | Status: DC
  Administered 2012-05-07 – 2012-05-09 (×3): 800 mg via ORAL
  Filled 2012-05-06 (×5): qty 1

## 2012-05-06 MED ORDER — PROMETHAZINE HCL 25 MG PO TABS: 25.0000 mg | ORAL_TABLET | Freq: Four times a day (QID) | ORAL | Status: DC | PRN

## 2012-05-06 MED ORDER — SORBITOL 70 % SOLN: 30.0000 mL | Freq: Every day | Status: DC | PRN

## 2012-05-06 MED ORDER — DIPHENHYDRAMINE HCL 12.5 MG/5ML PO ELIX: 12.5000 mg | ORAL_SOLUTION | ORAL | Status: DC | PRN

## 2012-05-06 MED ORDER — ROCURONIUM BROMIDE 100 MG/10ML IV SOLN
INTRAVENOUS | Status: DC | PRN
  Administered 2012-05-06: 10 mg via INTRAVENOUS
  Administered 2012-05-06: 40 mg via INTRAVENOUS

## 2012-05-06 MED ORDER — PHENOL 1.4 % MT LIQD: 1.0000 | OROMUCOSAL | Status: DC | PRN

## 2012-05-06 MED ORDER — VITAMIN D (ERGOCALCIFEROL) 1.25 MG (50000 UNIT) PO CAPS: 50000.0000 [IU] | ORAL_CAPSULE | ORAL | Status: DC

## 2012-05-06 MED ORDER — ALPRAZOLAM 0.5 MG PO TABS
1.0000 mg | ORAL_TABLET | Freq: Two times a day (BID) | ORAL | Status: DC
  Administered 2012-05-06 – 2012-05-09 (×5): 1 mg via ORAL
  Filled 2012-05-06: qty 1
  Filled 2012-05-06 (×4): qty 2

## 2012-05-06 MED ORDER — BUPIVACAINE HCL (PF) 0.25 % IJ SOLN
INTRAMUSCULAR | Status: DC | PRN
  Administered 2012-05-06: 20 mL

## 2012-05-06 MED ORDER — ONDANSETRON HCL 4 MG PO TABS: 4.0000 mg | ORAL_TABLET | Freq: Four times a day (QID) | ORAL | Status: DC | PRN

## 2012-05-06 MED ORDER — CEFAZOLIN SODIUM 1-5 GM-% IV SOLN
1.0000 g | Freq: Four times a day (QID) | INTRAVENOUS | Status: AC
  Administered 2012-05-06 – 2012-05-07 (×3): 1 g via INTRAVENOUS
  Filled 2012-05-06 (×4): qty 50

## 2012-05-06 MED ORDER — LACTATED RINGERS IV SOLN
INTRAVENOUS | Status: DC | PRN
  Administered 2012-05-06 (×2): via INTRAVENOUS

## 2012-05-06 MED ORDER — FLUTICASONE PROPIONATE HFA 44 MCG/ACT IN AERO
2.0000 | INHALATION_SPRAY | Freq: Two times a day (BID) | RESPIRATORY_TRACT | Status: DC
  Administered 2012-05-06 – 2012-05-09 (×6): 2 via RESPIRATORY_TRACT
  Filled 2012-05-06: qty 10.6

## 2012-05-06 MED ORDER — DOCUSATE SODIUM 100 MG PO CAPS
100.0000 mg | ORAL_CAPSULE | Freq: Two times a day (BID) | ORAL | Status: DC
Start: 2012-05-06 — End: 2012-05-09
  Administered 2012-05-06 – 2012-05-09 (×6): 100 mg via ORAL
  Filled 2012-05-06 (×8): qty 1

## 2012-05-06 MED ORDER — METOCLOPRAMIDE HCL 5 MG/ML IJ SOLN: 5.0000 mg | Freq: Three times a day (TID) | INTRAMUSCULAR | Status: DC | PRN

## 2012-05-06 MED ORDER — PANTOPRAZOLE SODIUM 40 MG PO TBEC
80.0000 mg | DELAYED_RELEASE_TABLET | Freq: Every day | ORAL | Status: DC
  Administered 2012-05-07 – 2012-05-09 (×2): 80 mg via ORAL
  Filled 2012-05-06: qty 2
  Filled 2012-05-06 (×2): qty 1

## 2012-05-06 MED ORDER — POLYETHYLENE GLYCOL 3350 17 G PO PACK: 17.0000 g | PACK | Freq: Every day | ORAL | Status: DC | PRN

## 2012-05-06 MED ORDER — METHOCARBAMOL 750 MG PO TABS: 750.0000 mg | ORAL_TABLET | Freq: Four times a day (QID) | ORAL | Status: DC | PRN

## 2012-05-06 MED ORDER — GLYCOPYRROLATE 0.2 MG/ML IJ SOLN
INTRAMUSCULAR | Status: DC | PRN
  Administered 2012-05-06: 0.4 mg via INTRAVENOUS

## 2012-05-06 MED ORDER — MIDAZOLAM HCL 5 MG/5ML IJ SOLN
INTRAMUSCULAR | Status: DC | PRN
  Administered 2012-05-06: 2 mg via INTRAVENOUS

## 2012-05-06 MED ORDER — CLONIDINE HCL 0.1 MG PO TABS
0.1000 mg | ORAL_TABLET | Freq: Every day | ORAL | Status: DC
  Administered 2012-05-06 – 2012-05-09 (×3): 0.1 mg via ORAL
  Filled 2012-05-06 (×4): qty 1

## 2012-05-06 MED ORDER — METHOCARBAMOL 100 MG/ML IJ SOLN
500.0000 mg | Freq: Four times a day (QID) | INTRAVENOUS | Status: DC | PRN
  Administered 2012-05-06: 500 mg via INTRAVENOUS
  Filled 2012-05-06: qty 5

## 2012-05-06 MED ORDER — ALUM & MAG HYDROXIDE-SIMETH 200-200-20 MG/5ML PO SUSP: 30.0000 mL | ORAL | Status: DC | PRN

## 2012-05-06 MED ORDER — RALTEGRAVIR POTASSIUM 400 MG PO TABS
400.0000 mg | ORAL_TABLET | Freq: Two times a day (BID) | ORAL | Status: DC
  Administered 2012-05-06 – 2012-05-09 (×6): 400 mg via ORAL
  Filled 2012-05-06 (×7): qty 1

## 2012-05-06 MED ORDER — RITONAVIR 100 MG PO CAPS
100.0000 mg | ORAL_CAPSULE | Freq: Every day | ORAL | Status: DC
  Administered 2012-05-07 – 2012-05-09 (×3): 100 mg via ORAL
  Filled 2012-05-06 (×3): qty 1

## 2012-05-06 MED ORDER — OXYCODONE-ACETAMINOPHEN 10-325 MG PO TABS: 1.0000 | ORAL_TABLET | Freq: Four times a day (QID) | ORAL | Status: DC | PRN

## 2012-05-06 MED ORDER — PREDNISONE 20 MG PO TABS
20.0000 mg | ORAL_TABLET | Freq: Every day | ORAL | Status: AC
  Administered 2012-05-07 – 2012-05-08 (×2): 20 mg via ORAL
  Filled 2012-05-06 (×2): qty 1

## 2012-05-06 MED ORDER — FOLIC ACID 1 MG PO TABS
1.0000 mg | ORAL_TABLET | Freq: Every day | ORAL | Status: DC
  Administered 2012-05-06 – 2012-05-09 (×4): 1 mg via ORAL
  Filled 2012-05-06 (×4): qty 1

## 2012-05-06 MED ORDER — FENTANYL CITRATE 0.05 MG/ML IJ SOLN
INTRAMUSCULAR | Status: DC | PRN
  Administered 2012-05-06 (×9): 50 ug via INTRAVENOUS

## 2012-05-06 MED ORDER — DICLOFENAC SODIUM 75 MG PO TBEC
75.0000 mg | DELAYED_RELEASE_TABLET | Freq: Two times a day (BID) | ORAL | Status: DC
  Administered 2012-05-06 – 2012-05-09 (×6): 75 mg via ORAL
  Filled 2012-05-06 (×7): qty 1

## 2012-05-06 MED ORDER — NEOSTIGMINE METHYLSULFATE 1 MG/ML IJ SOLN
INTRAMUSCULAR | Status: DC | PRN
  Administered 2012-05-06: 3 mg via INTRAVENOUS

## 2012-05-06 MED ORDER — PREDNISONE 10 MG PO TABS
10.0000 mg | ORAL_TABLET | Freq: Every day | ORAL | Status: DC
  Administered 2012-05-09: 10 mg via ORAL
  Filled 2012-05-06 (×2): qty 1

## 2012-05-06 MED ORDER — OXYCODONE HCL 5 MG PO TABS
5.0000 mg | ORAL_TABLET | ORAL | Status: DC | PRN
  Administered 2012-05-06 – 2012-05-09 (×6): 10 mg via ORAL
  Filled 2012-05-06 (×2): qty 2
  Filled 2012-05-06: qty 1
  Filled 2012-05-06 (×4): qty 2

## 2012-05-06 MED ORDER — ONDANSETRON HCL 4 MG/2ML IJ SOLN: 4.0000 mg | Freq: Four times a day (QID) | INTRAMUSCULAR | Status: DC | PRN

## 2012-05-06 MED ORDER — METHOCARBAMOL 500 MG PO TABS: 500.0000 mg | ORAL_TABLET | Freq: Four times a day (QID) | ORAL | Status: DC

## 2012-05-06 MED ORDER — POTASSIUM CHLORIDE IN NACL 20-0.45 MEQ/L-% IV SOLN
INTRAVENOUS | Status: DC
  Administered 2012-05-06 – 2012-05-07 (×2): via INTRAVENOUS
  Administered 2012-05-07: 75 mL/h via INTRAVENOUS
  Filled 2012-05-06 (×9): qty 1000

## 2012-05-06 MED ORDER — VERAPAMIL HCL ER 240 MG PO TBCR
240.0000 mg | EXTENDED_RELEASE_TABLET | Freq: Every day | ORAL | Status: DC
  Administered 2012-05-06 – 2012-05-08 (×3): 240 mg via ORAL
  Filled 2012-05-06 (×4): qty 1

## 2012-05-06 MED ORDER — LIDOCAINE HCL (CARDIAC) 20 MG/ML IV SOLN
INTRAVENOUS | Status: DC | PRN
  Administered 2012-05-06: 70 mg via INTRAVENOUS

## 2012-05-06 MED ORDER — ALBUTEROL SULFATE HFA 108 (90 BASE) MCG/ACT IN AERS: 2.0000 | INHALATION_SPRAY | RESPIRATORY_TRACT | Status: DC | PRN

## 2012-05-06 MED ORDER — ONDANSETRON HCL 4 MG/2ML IJ SOLN
INTRAMUSCULAR | Status: DC | PRN
  Administered 2012-05-06: 4 mg via INTRAVENOUS

## 2012-05-06 MED ORDER — METOCLOPRAMIDE HCL 5 MG PO TABS: 5.0000 mg | ORAL_TABLET | Freq: Three times a day (TID) | ORAL | Status: DC | PRN

## 2012-05-06 MED ORDER — HYDROMORPHONE HCL PF 1 MG/ML IJ SOLN
INTRAMUSCULAR | Status: AC
  Filled 2012-05-06: qty 1

## 2012-05-06 MED ORDER — ACETAMINOPHEN 325 MG PO TABS: 650.0000 mg | ORAL_TABLET | Freq: Four times a day (QID) | ORAL | Status: DC | PRN

## 2012-05-06 MED ORDER — PANTOPRAZOLE SODIUM 40 MG PO TBEC: 80.0000 mg | DELAYED_RELEASE_TABLET | Freq: Every day | ORAL | Status: DC

## 2012-05-06 MED ORDER — SENNA 8.6 MG PO TABS
1.0000 | ORAL_TABLET | Freq: Two times a day (BID) | ORAL | Status: DC
  Administered 2012-05-06 – 2012-05-09 (×6): 8.6 mg via ORAL
  Filled 2012-05-06 (×7): qty 1

## 2012-05-06 MED ORDER — DIVALPROEX SODIUM 250 MG PO DR TAB
250.0000 mg | DELAYED_RELEASE_TABLET | Freq: Two times a day (BID) | ORAL | Status: DC
  Administered 2012-05-06 – 2012-05-09 (×6): 250 mg via ORAL
  Filled 2012-05-06 (×7): qty 1

## 2012-05-06 SURGICAL SUPPLY — 62 items
APL SKNCLS STERI-STRIP NONHPOA (GAUZE/BANDAGES/DRESSINGS) ×2
BENZOIN TINCTURE PRP APPL 2/3 (GAUZE/BANDAGES/DRESSINGS) ×3 IMPLANT
BLADE SAW SAG 29X58X.64 (BLADE) ×3 IMPLANT
BLADE SURG 15 STRL LF DISP TIS (BLADE) ×2 IMPLANT
BLADE SURG 15 STRL SS (BLADE) ×3
BOOTCOVER CLEANROOM LRG (PROTECTIVE WEAR) ×6 IMPLANT
BOWL SMART MIX CTS (DISPOSABLE) IMPLANT
BRUSH FEMORAL CANAL (MISCELLANEOUS) IMPLANT
CLOTH BEACON ORANGE TIMEOUT ST (SAFETY) ×3 IMPLANT
CLSR STERI-STRIP ANTIMIC 1/2X4 (GAUZE/BANDAGES/DRESSINGS) ×3 IMPLANT
COVER SURGICAL LIGHT HANDLE (MISCELLANEOUS) ×3 IMPLANT
COVER TABLE BACK 60X90 (DRAPES) IMPLANT
DRAPE C-ARM 42X72 X-RAY (DRAPES) IMPLANT
DRAPE INCISE IOBAN 66X45 STRL (DRAPES) ×3 IMPLANT
DRAPE U-SHAPE 47X51 STRL (DRAPES) ×3 IMPLANT
DRSG MEPILEX BORDER 4X8 (GAUZE/BANDAGES/DRESSINGS) ×6 IMPLANT
DRSG PAD ABDOMINAL 8X10 ST (GAUZE/BANDAGES/DRESSINGS) ×3 IMPLANT
DURAPREP 26ML APPLICATOR (WOUND CARE) ×3 IMPLANT
ELECT BLADE 6.5 EXT (BLADE) IMPLANT
ELECT NEEDLE TIP 2.8 STRL (NEEDLE) ×3 IMPLANT
ELECT REM PT RETURN 9FT ADLT (ELECTROSURGICAL) ×3
ELECTRODE REM PT RTRN 9FT ADLT (ELECTROSURGICAL) ×2 IMPLANT
EVACUATOR 1/8 PVC DRAIN (DRAIN) IMPLANT
FACESHIELD LNG OPTICON STERILE (SAFETY) ×3 IMPLANT
GLOVE BIOGEL PI IND STRL 8 (GLOVE) ×4 IMPLANT
GLOVE BIOGEL PI INDICATOR 8 (GLOVE) ×2
GLOVE ORTHO TXT STRL SZ7.5 (GLOVE) ×3 IMPLANT
GLOVE SURG ORTHO 8.0 STRL STRW (GLOVE) ×6 IMPLANT
GOWN STRL NON-REIN LRG LVL3 (GOWN DISPOSABLE) ×3 IMPLANT
HANDPIECE INTERPULSE COAX TIP (DISPOSABLE)
HOOD PEEL AWAY FACE SHEILD DIS (HOOD) ×6 IMPLANT
KIT BASIN OR (CUSTOM PROCEDURE TRAY) ×3 IMPLANT
KIT ROOM TURNOVER OR (KITS) ×3 IMPLANT
MANIFOLD NEPTUNE II (INSTRUMENTS) ×3 IMPLANT
NEEDLE 1/2 CIR CATGUT .05X1.09 (NEEDLE) ×3 IMPLANT
NEEDLE HYPO 25GX1X1/2 BEV (NEEDLE) ×3 IMPLANT
NS IRRIG 1000ML POUR BTL (IV SOLUTION) ×3 IMPLANT
PACK SHOULDER (CUSTOM PROCEDURE TRAY) ×3 IMPLANT
PAD ARMBOARD 7.5X6 YLW CONV (MISCELLANEOUS) ×3 IMPLANT
RETRIEVER SUT HEWSON (MISCELLANEOUS) IMPLANT
SET HNDPC FAN SPRY TIP SCT (DISPOSABLE) IMPLANT
SLING ARM IMMOBILIZER LRG (SOFTGOODS) IMPLANT
SLING ARM IMMOBILIZER MED (SOFTGOODS) ×3 IMPLANT
SMARTMIX MINI TOWER (MISCELLANEOUS)
SPONGE GAUZE 4X4 12PLY (GAUZE/BANDAGES/DRESSINGS) ×3 IMPLANT
SPONGE LAP 18X18 X RAY DECT (DISPOSABLE) ×6 IMPLANT
STRIP CLOSURE SKIN 1/2X4 (GAUZE/BANDAGES/DRESSINGS) ×3 IMPLANT
SUCTION FRAZIER TIP 10 FR DISP (SUCTIONS) ×3 IMPLANT
SUPPORT WRAP ARM LG (MISCELLANEOUS) ×3 IMPLANT
SUT FIBERWIRE #2 38 T-5 BLUE (SUTURE) ×15
SUT MNCRL AB 4-0 PS2 18 (SUTURE) ×3 IMPLANT
SUT VIC AB 0 CT1 27 (SUTURE) ×2
SUT VIC AB 0 CT1 27XBRD ANBCTR (SUTURE) ×2 IMPLANT
SUT VIC AB 2-0 CT1 27 (SUTURE)
SUT VIC AB 2-0 CT1 TAPERPNT 27 (SUTURE) IMPLANT
SUTURE FIBERWR #2 38 T-5 BLUE (SUTURE) ×10 IMPLANT
SYR CONTROL 10ML LL (SYRINGE) ×3 IMPLANT
TOWEL OR 17X24 6PK STRL BLUE (TOWEL DISPOSABLE) ×3 IMPLANT
TOWEL OR 17X26 10 PK STRL BLUE (TOWEL DISPOSABLE) ×3 IMPLANT
TOWER SMARTMIX MINI (MISCELLANEOUS) IMPLANT
TRAY FOLEY CATH 14FR (SET/KITS/TRAYS/PACK) IMPLANT
WATER STERILE IRR 1000ML POUR (IV SOLUTION) ×3 IMPLANT

## 2012-05-06 NOTE — Progress Notes (Signed)
Orthopedic Tech Progress Note Patient Details:  [name] 1960/11/21 [id number]  Patient ID: [name], female   DOB: 01-20-1961, 51 y.o.   MRN: [id number] Trapeze bar patient helper  Nikki Dom 05/06/2012, 3:33 PM

## 2012-05-06 NOTE — Progress Notes (Signed)
UR COMPLETED  

## 2012-05-06 NOTE — Progress Notes (Signed)
Called Dr. Jacklynn Bue abt high BP--states he does not wish to treat. Will cont to monitor.

## 2012-05-06 NOTE — Progress Notes (Signed)
Pt. States that she is unable to give urine speciman this morning

## 2012-05-06 NOTE — Anesthesia Postprocedure Evaluation (Signed)
  Anesthesia Post-op Note  Patient: [name]  Procedure(s) Performed: Procedure(s) (LRB): SHOULDER HEMI-ARTHROPLASTY (Right)  Patient Location: PACU  Anesthesia Type: General  Level of Consciousness: awake  Airway and Oxygen Therapy: Patient Spontanous Breathing  Post-op Pain: mild  Post-op Assessment: Post-op Vital signs reviewed  Post-op Vital Signs: stable  Complications: No apparent anesthesia complications

## 2012-05-06 NOTE — Transfer of Care (Signed)
Immediate Anesthesia Transfer of Care Note  Patient: [name]  Procedure(s) Performed: Procedure(s) (LRB): TOTAL SHOULDER ARTHROPLASTY (Right)  Patient Location: PACU  Anesthesia Type: General  Level of Consciousness: awake, alert  and oriented  Airway & Oxygen Therapy: Patient Spontanous Breathing and Patient connected to face mask oxygen  Post-op Assessment: Report given to PACU RN, Post -op Vital signs reviewed and stable and Patient moving all extremities X 4  Post vital signs: Reviewed and stable  Complications: No apparent anesthesia complications

## 2012-05-06 NOTE — Op Note (Signed)
05/06/2012  10:01 AM  PATIENT:  [name]    PRE-OPERATIVE DIAGNOSIS:  Avascular Necrosis RIGHT SHOULER  POST-OPERATIVE DIAGNOSIS:  Same  PROCEDURE:  SHOULDER HEMIARTHROPLASTY  SURGEON:  Eulas Post, MD  PHYSICIAN ASSISTANT: Janace Litten, OPA-C, present and scrubbed throughout the case, critical for completion in a timely fashion, and for retraction, instrumentation, and closure.  ANESTHESIA:   General  PREOPERATIVE INDICATIONS:  [name] is a  51 y.o. female with a diagnosis of Avascular Necrosis RIGHT SHOULER who failed conservative measures and elected for surgical management.  Her avascular necrosis is likely secondary to her antiretroviral medications which she uses for her HIV.  The risks benefits and alternatives were discussed with the patient preoperatively including but not limited to the risks of infection, bleeding, nerve injury, cardiopulmonary complications, the need for revision surgery, dislocation, loosening, incomplete relief of pain, among others, and the patient was willing to proceed.    OPERATIVE IMPLANTS: Biomet size 9 MINIpress-fit humeral stem, size 42 x 18 mm Versa-dial humeral head, set in the E position with increased coverage posteriorly and superiorly, as the inferior neck was very small.  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 The glenoid itself was relatively normal. There is a little bit of wear inferiorly, but over 80% of the cartilage was still intact. For this reason I did not do arthroplasty of the glenoid, particularly given her young age.   OPERATIVE PROCEDURE: The patient is brought to the operating room and placed in the supine position. General anesthesia was administered. IV antibiotics were given.  The upper extremity was prepped and draped in usual sterile fashion. The patient was in a beachchair position with all bony prominences padded.   Time out was performed and a deltopectoral approach  was carried out. The biceps tendon was tenodesed to the pectoralis tendon. There was an interesting sizable cysts within the biceps tendon, which was excised. The subscapularis was released, tagging it with a #2 FiberWire, leaving a cuff of tendon for repair.   The inferior osteophyte was removed, and release of the capsule off of the humeral side was completed. The head was dislocated, and I reamed sequentially. I placed the humeral cutting guide at 30 of retroversion, and then pinned this into place, and made my humeral neck cut. This is at the appropriate level.   I then placed deep retractors and exposed the glenoid. Inspection demonstrated the above-named findings, and do to the minimal changes and her young age, I did not glenoid.  I sequentially broached, up to the selected size, with the broach set at 30 of retroversion. I then placed the real stem. I trialed with multiple heads, and the above-named component was selected. Increased posterior and superior rotation improved the coverage. The soft tissue tension was appropriate.   I then impacted the real humeral head into place, reduced the head, and irrigated copiously. Excellent stability and range of motion was achieved. I repaired the subscapularis with 4 #2 FiberWire, as well as the rotator interval, and irrigated copiously once more. The subcutaneous tissue was closed with Vicryl including the deltopectoral fascia.   The skin was closed with Steri-Strips and sterile gauze was applied.  She tolerated the procedure well and there were no complications.

## 2012-05-06 NOTE — H&P (Signed)
PREOPERATIVE H&P  Chief Complaint: AVN RIGHT SHOULER  HPI: [name] is a 51 y.o. female who presents for preoperative history and physical with a diagnosis of AVN RIGHT SHOULER. Symptoms are rated as moderate to severe, and have been worsening.  This is significantly impairing activities of daily living.  She has elected for surgical management.   Past Medical History  Diagnosis Date  . Baker's cyst   . Pulmonary lesion 1/03    on RUL onn CT   . Hemorrhoids   . Zoster 2001  . Family history of diabetes mellitus   . Tobacco user   . Other abnormal Papanicolaou smear of cervix and cervical HPV   . Herpes zoster   . Hemorrhoids   . Baker's cyst   . Pancreatitis   . HPV (human papilloma virus) infection   . HIV disease     take Prezista,Truvada,Norvir,and Isentress daily  . Bipolar disorder     With hx of psychotic features.  . Articular cartilage disorder of knee   . Bursitis of shoulder   . Rotator cuff arthropathy   . Hypertension     takes Clonidine and Verapamil daily  . Emphysema   . Asthma     Albuterol resue inhaler and QVAR daily  . Shortness of breath     with exertion and stress  . Pneumonia     hx of;last time 1 1/75yrs ago  . Headache   . Dizziness   . Arthritis   . Joint pain   . Joint swelling   . Chronic back pain     buldging disc  . Bruises easily   . Gastric ulcer   . GERD (gastroesophageal reflux disease)     takes Nexium daily  . Chronic constipation   . Urinary frequency   . Urinary incontinence   . Nocturia   . History of blood transfusion at age 68  . Early cataracts, bilateral   . Anxiety   . Depression     takes Xanax daily and Depakote bid  . History of shingles    Past Surgical History  Procedure Date  . Right breast lumpectomy   . Total abdominal hysterectomy w/ bilateral salpingoophorectomy at age 35  . Breast surgery as a teenager    rt breast biopsy  . Cholecystectomy 10+yrs ago  . Knee arthroscopy     bilateral   . Appendectomy     with hysterectomy  . Finger surgery     rt middle finger with pin in it  . Epidural injections   . Esophagogastroduodenoscopy    History   Social History  . Marital Status: Widowed    Spouse Name: N/A    Number of Children: N/A  . Years of Education: N/A   Social History Main Topics  . Smoking status: Current Everyday Smoker -- 1.0 packs/day for 20 years    Types: Cigarettes  . Smokeless tobacco: Never Used  . Alcohol Use: 0.5 oz/week    1 drink(s) per week     brandy  . Drug Use: Yes    Special: Marijuana     month ago  . Sexually Active: Yes -- Female partner(s)    Birth Control/ Protection: Surgical     pt. given condoms   Other Topics Concern  . None   Social History Narrative  . None   Family History  Problem Relation Age of Onset  . Diabetes      Family History  . Hypertension  Family history   . Hypertension Mother   . Diabetes Mother   . Diabetes Father   . Cancer Sister 54    x2 (liver)  . Diabetes Brother    Allergies  Allergen Reactions  . Aspirin Nausea And Vomiting  . Sulfonamide Derivatives     REACTION: hives   Prior to Admission medications   Medication Sig Start Date End Date Taking? Authorizing Provider  albuterol (PROVENTIL HFA;VENTOLIN HFA) 108 (90 BASE) MCG/ACT inhaler Inhale 2 puffs into the lungs every 4 (four) hours as needed. 2 puffs every 4-6 hours as needed for shortness of breath 12/31/11  Yes Kerri Perches, MD  ALPRAZolam Prudy Feeler) 1 MG tablet Take 1 mg by mouth 2 (two) times daily. Take 1 tablet by mouth two times a day as needed. For anxiety 12/26/11  Yes Kerri Perches, MD  beclomethasone (QVAR) 40 MCG/ACT inhaler Inhale 2 puffs into the lungs 2 (two) times daily. 2 puffs two times a day 12/31/11  Yes Kerri Perches, MD  cloNIDine (CATAPRES) 0.1 MG tablet Take 0.1 mg by mouth daily.   Yes Historical Provider, MD  Cyanocobalamin (VITAMIN B-12 IJ) Inject as directed every 30 (thirty) days.   Yes  Historical Provider, MD  cyclobenzaprine (FLEXERIL) 10 MG tablet Take 10 mg by mouth 3 (three) times daily as needed. For muscle aches   Yes Historical Provider, MD  darunavir (PREZISTA) 400 MG tablet Take 2 tablets (800 mg total) by mouth once. 07/27/11 07/26/12 Yes Ginnie Smart, MD  diazepam (VALIUM) 5 MG tablet Take 5 mg by mouth every 6 (six) hours as needed. For muscle spasms/anxiety   Yes Historical Provider, MD  diclofenac (VOLTAREN) 75 MG EC tablet Take 75 mg by mouth 2 (two) times daily.   Yes Historical Provider, MD  divalproex (DEPAKOTE) 250 MG DR tablet 1 in AM and 1 at [facility] 08/28/11  Yes Cleotis Nipper, MD  emtricitabine-tenofovir (TRUVADA) 200-300 MG per tablet Take 1 tablet by mouth daily.   Yes Historical Provider, MD  esomeprazole (NEXIUM) 40 MG capsule Take 1 capsule (40 mg total) by mouth daily before breakfast. Take one capsule by mouth once a day 05/02/11  Yes Kerri Perches, MD  folic acid (FOLVITE) 1 MG tablet Take 1 mg by mouth daily.     Yes Historical Provider, MD  indomethacin (INDOCIN) 25 MG capsule Take 25 mg by mouth 2 (two) times daily with a meal.   Yes Historical Provider, MD  methocarbamol (ROBAXIN) 750 MG tablet Take 750 mg by mouth 4 (four) times daily as needed. For muscle spasms   Yes Historical Provider, MD  predniSONE (DELTASONE) 10 MG tablet Take 10 mg by mouth as directed.   Yes Historical Provider, MD  promethazine (PHENERGAN) 25 MG tablet Take 25 mg by mouth 2 (two) times daily as needed. For nausea   Yes Historical Provider, MD  raltegravir (ISENTRESS) 400 MG tablet Take 400 mg by mouth 2 (two) times daily.   Yes Historical Provider, MD  ritonavir (NORVIR) 100 MG capsule Take 1 capsule (100 mg total) by mouth daily. 02/22/12  Yes Ginnie Smart, MD  verapamil (CALAN-SR) 240 MG CR tablet Take 240 mg by mouth at bedtime.   Yes Historical Provider, MD  Vitamin D, Ergocalciferol, (DRISDOL) 50000 UNITS CAPS Take 50,000 Units by mouth. Take on mondays   Yes  Historical Provider, MD     Positive ROS: All other systems have been reviewed and were otherwise negative with the  exception of those mentioned in the HPI and as above.  Physical Exam: General: Alert, no acute distress Cardiovascular: No pedal edema Respiratory: No cyanosis, no use of accessory musculature GI: No organomegaly, abdomen is soft and non-tender Skin: No lesions in the area of chief complaint Neurologic: Sensation intact distally Psychiatric: Patient is competent for consent with normal mood and affect Lymphatic: No axillary or cervical lymphadenopathy  MUSCULOSKELETAL: Right shoulder active range of motion is 0-90 at best. She has severe pain with crepitance.  Assessment: AVN RIGHT SHOULER  Plan: Plan for Procedure(s): SHOULDER ARTHROPLASTY hemi-versus total  The risks benefits and alternatives were discussed with the patient including but not limited to the risks of nonoperative treatment, versus surgical intervention including infection, bleeding, nerve injury,  blood clots, cardiopulmonary complications, morbidity, mortality, among others, and they were willing to proceed.   Brithany Whitworth P, MD Cell [phone] Pager [phone]  05/06/2012 7:31 AM

## 2012-05-06 NOTE — Anesthesia Preprocedure Evaluation (Signed)
Anesthesia Evaluation  Patient identified by MRN, date of birth, ID band Patient awake    Reviewed: Allergy & Precautions, H&P , NPO status   History of Anesthesia Complications Negative for: history of anesthetic complications  Airway Mallampati: I  Neck ROM: Full    Dental  (+) Edentulous Upper and Poor Dentition   Pulmonary shortness of breath, asthma , pneumonia -, resolved, COPD breath sounds clear to auscultation        Cardiovascular hypertension, Rhythm:Regular Rate:Normal     Neuro/Psych    GI/Hepatic PUD, GERD-  ,  Endo/Other    Renal/GU negative Renal ROS     Musculoskeletal  (+) Arthritis -,   Abdominal (+) + obese,   Peds  Hematology  (+) HIV,   Anesthesia Other Findings   Reproductive/Obstetrics                           Anesthesia Physical Anesthesia Plan  ASA: III  Anesthesia Plan: General   Post-op Pain Management:    Induction: Intravenous  Airway Management Planned: Oral ETT  Additional Equipment:   Intra-op Plan:   Post-operative Plan: Extubation in OR  Informed Consent: I have reviewed the patients History and Physical, chart, labs and discussed the procedure including the risks, benefits and alternatives for the proposed anesthesia with the patient or authorized representative who has indicated his/her understanding and acceptance.   Dental advisory given  Plan Discussed with: CRNA and Surgeon  Anesthesia Plan Comments:         Anesthesia Quick Evaluation

## 2012-05-07 ENCOUNTER — Encounter (HOSPITAL_COMMUNITY): Payer: Self-pay | Admitting: Orthopedic Surgery

## 2012-05-07 LAB — CBC
HCT: 31.6 % — ABNORMAL LOW (ref 36.0–46.0)
Hemoglobin: 10.5 g/dL — ABNORMAL LOW (ref 12.0–15.0)
MCH: 29.6 pg (ref 26.0–34.0)
MCHC: 33.2 g/dL (ref 30.0–36.0)
MCV: 89 fL (ref 78.0–100.0)
Platelets: 168 K/uL (ref 150–400)
RBC: 3.55 MIL/uL — ABNORMAL LOW (ref 3.87–5.11)
RDW: 14.2 % (ref 11.5–15.5)
WBC: 7.1 K/uL (ref 4.0–10.5)

## 2012-05-07 LAB — BASIC METABOLIC PANEL WITH GFR
BUN: 9 mg/dL (ref 6–23)
CO2: 26 meq/L (ref 19–32)
Calcium: 8.4 mg/dL (ref 8.4–10.5)
Chloride: 99 meq/L (ref 96–112)
Creatinine, Ser: 0.5 mg/dL (ref 0.50–1.10)
GFR calc Af Amer: >90 mL/min
GFR calc non Af Amer: >90 mL/min
Glucose, Bld: 88 mg/dL (ref 70–99)
Potassium: 3.5 meq/L (ref 3.5–5.1)
Sodium: 136 meq/L (ref 135–145)

## 2012-05-07 LAB — RAPID URINE DRUG SCREEN, HOSP PERFORMED
Cocaine: NOT DETECTED
Opiates: NOT DETECTED

## 2012-05-07 NOTE — Progress Notes (Signed)
Occupational Therapy Treatment Patient Details Name: [name] MRN: [id number] DOB: 04/20/1961 Today's Date: 05/07/2012 Time: 1535-1550 OT Time Calculation (min): 15 min  OT Assessment / Plan / Recommendation Comments on Treatment Session Began R elbow, hand and wrist A/AAROM. Pt with limited participation due to lethargy and apparent confusion. focus on educating boyfriend, who will be primary caregiver. Pt previously used walker or cane.  PT to eval for nec AD for safe D/C home. Pt's boyfriend given written protocol and verbalized understanding. Pt required cues to not try to use R shoulder.     Follow Up Recommendations  Home health OT    Barriers to Discharge  None    Equipment Recommendations  None recommended by OT    Recommendations for Other Services PT consult  Frequency Min 3X/week   Plan Discharge plan remains appropriate    Precautions / Restrictions Precautions Precautions: Shoulder Type of Shoulder Precautions: No ROM shoulder. sling at all times. NWB Precaution Booklet Issued: Yes (comment) Required Braces or Orthoses: Other Brace/Splint (sling - with abduction strap) Restrictions Weight Bearing Restrictions: Yes RUE Weight Bearing: Non weight bearing Other Position/Activity Restrictions: pillow behind r shoulder   Pertinent Vitals/Pain Does not appear in pain. lethargic    ADL  Eating/Feeding: Simulated;Set up Where Assessed - Eating/Feeding: Chair Grooming: Performed;Moderate assistance (mod a to atten to washing face.) Where Assessed - Grooming: Supported sitting Upper Body Bathing: Simulated;Maximal assistance Where Assessed - Upper Body Bathing: Supported sitting Lower Body Bathing: Simulated;+1 Total assistance Where Assessed - Lower Body Bathing: Supported sit to stand Upper Body Dressing: Simulated;+1 Total assistance Where Assessed - Upper Body Dressing: Supported sitting Lower Body Dressing: Simulated;+1 Total assistance Where Assessed -  Lower Body Dressing: Supported sit to Pharmacist, hospital: Electronics engineer Method: Sit to Barista: Other (comment) (bed - chair) Toileting - Clothing Manipulation and Hygiene: Simulated;+1 Total assistance Where Assessed - Glass blower/designer Manipulation and Hygiene: Standing Equipment Used: Gait belt Transfers/Ambulation Related to ADLs: Max A. Used walker for ambulation ADL Comments: Educated boyfriend on proper sling position and management. Also educated boyfriend on proper positioning of RUE in chair and in bed .    OT Diagnosis: Generalized weakness;Acute pain  OT Problem List: Decreased strength;Decreased range of motion;Decreased activity tolerance;Decreased knowledge of use of DME or AE;Decreased safety awareness;Decreased knowledge of precautions;Impaired UE functional use;Pain OT Treatment Interventions: Self-care/ADL training;Therapeutic exercise;Energy conservation;DME and/or AE instruction;Therapeutic activities;Patient/family education   OT Goals Acute Rehab OT Goals OT Goal Formulation: With patient Time For Goal Achievement: 05/14/12 Potential to Achieve Goals: Good ADL Goals Pt Will Perform Grooming: with min assist;Supported;Sitting, chair;with cueing (comment type and amount) ADL Goal: Grooming - Progress: Goal set today Pt Will Perform Upper Body Bathing: with min assist;with caregiver independent in assisting;Sitting, chair;Supported;with cueing (comment type and amount) ADL Goal: Upper Body Bathing - Progress: Goal set today Pt Will Perform Upper Body Dressing: with min assist;with caregiver independent in assisting;Sitting, chair;Supported;with cueing (comment type and amount) ADL Goal: Upper Body Dressing - Progress: Goal set today Pt Will Transfer to Toilet: with min assist;with caregiver independent in assisting;with DME;3-in-1;Maintaining weight bearing status ADL Goal: Toilet Transfer - Progress: Goal set  today Pt Will Perform Toileting - Clothing Manipulation: with mod assist;Standing;with cueing (comment type and amount) ADL Goal: Toileting - Clothing Manipulation - Progress: Goal set today Pt Will Perform Toileting - Hygiene: with min assist;with caregiver independent in assisting;Sit to stand from 3-in-1/toilet ADL Goal: Toileting - Hygiene - Progress: Goal  set today Additional ADL Goal #1: Pt's family will be independent with sling mgnt and positioniong of RUE in/out of sling. ADL Goal: Additional Goal #1 - Progress: Goal set today Additional ADL Goal #2: Pt/family will be independent with A/AAROM RUE with the exception of o ROM R shoulder.q ADL Goal: Additional Goal #2 - Progress: Goal set today  Visit Information  Last OT Received On: 05/07/12 Assistance Needed: +1    Subjective Data      Prior Functioning  Home Living Lives With: Significant other Available Help at Discharge: Personal care attendant;Family;Available 24 hours/day Type of Home: House Home Access: Stairs to enter [employer] of Steps: 4 Entrance Stairs-Rails: Right;Left;Can reach both Home Layout: One level Bathroom Shower/Tub: Forensic scientist: Standard Bathroom Accessibility: Yes How Accessible: Accessible via walker Home Adaptive Equipment: Bedside commode/3-in-1;Hand-held shower hose;Straight cane;Tub transfer bench;Walker - rolling Additional Comments: Has PCA 5 days/wk; 5 hrs/day. Prior Function Level of Independence: Independent with assistive device(s);Needs assistance Needs Assistance: Dressing;Meal Prep;Light Housekeeping Dressing: Minimal Meal Prep: Total Light Housekeeping: Total Able to Take Stairs?: Yes Driving: No Vocation: Unemployed Comments: assistance for fastening bra only Communication Communication: No difficulties Dominant Hand: Right    Cognition  Overall Cognitive Status: Impaired Area of Impairment: Attention;Memory;Following  commands;Problem solving Arousal/Alertness: Lethargic Orientation Level: Appears intact for tasks assessed Behavior During Session: Lethargic Current Attention Level: Focused Memory: Decreased recall of precautions Following Commands: Follows one step commands inconsistently Problem Solving: functional basic (requires incrased time) Cognition - Other Comments: Medication may have some impact on cognition, however, pt has only had 2 percocet today. Feel that pt's baseline cognition is impaired.    Mobility Bed Mobility Bed Mobility: Left Sidelying to Sit;Rolling Left;Sitting - Scoot to Edge of Bed Rolling Left: 4: Min assist Left Sidelying to Sit: 3: Mod assist;HOB elevated;With rails Sitting - Scoot to Edge of Bed: 4: Min assist Details for Bed Mobility Assistance: increased time required Transfers Transfers: Sit to Stand;Stand to Sit Sit to Stand: 3: Mod assist;With upper extremity assist;From bed Stand to Sit: 3: Mod assist;With upper extremity assist;To chair/3-in-1 Details for Transfer Assistance: Asist to control descent   Exercises General Exercises - Upper Extremity Elbow Flexion: AROM;AAROM;Right;5 reps;Supine Elbow Extension: AROM;AAROM;Right;5 reps;Supine Wrist Flexion: AROM;Right;5 reps;Supine Wrist Extension: AROM;Right;5 reps;Supine Digit Composite Flexion: AROM;Right;5 reps;Seated Composite Extension: AROM;Right;5 reps;Seated  Balance    End of Session OT - End of Session Equipment Utilized During Treatment: Gait belt Activity Tolerance: Patient limited by pain;Other (comment) Patient left: in chair;with call bell/phone within reach;with family/visitor present Nurse Communication: Other (comment)  GO     Jerson Furukawa,[name] 05/07/2012, 6:19 PM [facility], OTR/L  [phone] 05/07/2012

## 2012-05-07 NOTE — Progress Notes (Signed)
Occupational Therapy Evaluation Patient Details Name: [name] MRN: [id number] DOB: 1960-11-19 Today's Date: 05/07/2012 Time: 4098-1191 OT Time Calculation (min): 25 min  OT Assessment / Plan / Recommendation Clinical Impression  51 yo pt s/p R TSA. PT with complicated PMH, including HIV+ and avasclar necrosis. Pt extremely lethargic today, however completed eval. Pt more alert upright in chair. Pt will benefit from skilled OT servicesto Max independence with ADL and funcitonal mobility for ADL, in addition to teaching pt and boyfriend shoulder protocal, Will further assess if pt will need HH services. Pt does have PCA and boyfriend will be there when PCA is not. PTA, pt had a sedentary lifestyle.    OT Assessment  Patient needs continued OT Services    Follow Up Recommendations  Home health OT    Barriers to Discharge None    Equipment Recommendations  None recommended by OT    Recommendations for Other Services PT consult  Frequency  Min 3X/week    Precautions / Restrictions Precautions Precautions: Shoulder Type of Shoulder Precautions: No ROM shoulder. sling at all times. NWB Precaution Booklet Issued: Yes (comment) Required Braces or Orthoses: Other Brace/Splint (sling - with abduction strap) Restrictions Weight Bearing Restrictions: Yes RUE Weight Bearing: Non weight bearing Other Position/Activity Restrictions: pillow behind r shoulder   Pertinent Vitals/Pain Does not give #.states arm "hurts". Falling asleep.    ADL  Eating/Feeding: Simulated;Set up Where Assessed - Eating/Feeding: Chair Grooming: Simulated;Moderate assistance Where Assessed - Grooming: Supported sitting Upper Body Bathing: Simulated;Maximal assistance Where Assessed - Upper Body Bathing: Supported sitting Lower Body Bathing: Simulated;+1 Total assistance Where Assessed - Lower Body Bathing: Supported sit to stand Upper Body Dressing: Simulated;+1 Total assistance Where Assessed - Upper  Body Dressing: Supported sitting Lower Body Dressing: Simulated;+1 Total assistance Where Assessed - Lower Body Dressing: Supported sit to Pharmacist, hospital: Electronics engineer Method: Sit to Barista: Other (comment) (bed - chair) Toileting - Clothing Manipulation and Hygiene: Simulated;+1 Total assistance Where Assessed - Glass blower/designer Manipulation and Hygiene: Standing Equipment Used: Gait belt Transfers/Ambulation Related to ADLs: Max A. Used walker for ambulation    OT Diagnosis: Generalized weakness;Acute pain  OT Problem List: Decreased strength;Decreased range of motion;Decreased activity tolerance;Decreased knowledge of use of DME or AE;Decreased safety awareness;Decreased knowledge of precautions;Impaired UE functional use;Pain OT Treatment Interventions: Self-care/ADL training;Therapeutic exercise;Energy conservation;DME and/or AE instruction;Therapeutic activities;Patient/family education   OT Goals Acute Rehab OT Goals OT Goal Formulation: With patient Time For Goal Achievement: 05/14/12 Potential to Achieve Goals: Good ADL Goals Pt Will Perform Grooming: with min assist;Supported;Sitting, chair;with cueing (comment type and amount) ADL Goal: Grooming - Progress: Goal set today Pt Will Perform Upper Body Bathing: with min assist;with caregiver independent in assisting;Sitting, chair;Supported;with cueing (comment type and amount) ADL Goal: Upper Body Bathing - Progress: Goal set today Pt Will Perform Upper Body Dressing: with min assist;with caregiver independent in assisting;Sitting, chair;Supported;with cueing (comment type and amount) ADL Goal: Upper Body Dressing - Progress: Goal set today Pt Will Transfer to Toilet: with min assist;with caregiver independent in assisting;with DME;3-in-1;Maintaining weight bearing status ADL Goal: Toilet Transfer - Progress: Goal set today Pt Will Perform Toileting - Clothing  Manipulation: with mod assist;Standing;with cueing (comment type and amount) ADL Goal: Toileting - Clothing Manipulation - Progress: Goal set today Pt Will Perform Toileting - Hygiene: with min assist;with caregiver independent in assisting;Sit to stand from 3-in-1/toilet ADL Goal: Toileting - Hygiene - Progress: Goal set today Additional ADL Goal #1: Pt's  family will be independent with sling mgnt and positioniong of RUE in/out of sling. ADL Goal: Additional Goal #1 - Progress: Goal set today Additional ADL Goal #2: Pt/family will be independent with A/AAROM RUE with the exception of o ROM R shoulder.q ADL Goal: Additional Goal #2 - Progress: Goal set today  Visit Information  Last OT Received On: 05/07/12 Assistance Needed: +1    Subjective Data      Prior Functioning  Vision/Perception  Home Living Lives With: Significant other Available Help at Discharge: Personal care attendant;Family;Available 24 hours/day Type of Home: House Home Access: Stairs to enter [employer] of Steps: 4 Entrance Stairs-Rails: Right;Left;Can reach both Home Layout: One level Bathroom Shower/Tub: Forensic scientist: Standard Bathroom Accessibility: Yes How Accessible: Accessible via walker Home Adaptive Equipment: Bedside commode/3-in-1;Hand-held shower hose;Straight cane;Tub transfer bench;Walker - rolling Additional Comments: Has PCA 5 days/wk; 5 hrs/day. Prior Function Level of Independence: Independent with assistive device(s);Needs assistance Needs Assistance: Dressing;Meal Prep;Light Housekeeping Dressing: Minimal Meal Prep: Total Light Housekeeping: Total Able to Take Stairs?: Yes Driving: No Vocation: Unemployed Comments: assistance for fastening bra only Communication Communication: No difficulties Dominant Hand: Right      Cognition  Overall Cognitive Status: Impaired Area of Impairment: Attention;Memory;Following commands;Problem  solving Arousal/Alertness: Lethargic Orientation Level: Appears intact for tasks assessed Behavior During Session: Lethargic Current Attention Level: Focused Memory: Decreased recall of precautions Following Commands: Follows one step commands inconsistently Problem Solving: functional basic (requires incrased time) Cognition - Other Comments: Medication may have some impact on cognition, however, pt has only had 2 percocet today. Feel that pt's baseline cognition is impaired.    Extremity/Trunk Assessment Right Upper Extremity Assessment RUE ROM/Strength/Tone: Deficits RUE ROM/Strength/Tone Deficits: elbow/hand AAROM WFL. RUE Sensation: WFL - Light Touch;WFL - Proprioception RUE Coordination: Deficits RUE Coordination Deficits: due to precautions and sling Left Upper Extremity Assessment LUE ROM/Strength/Tone: Within functional levels Trunk Assessment Trunk Assessment: Normal   Mobility Bed Mobility Bed Mobility: Left Sidelying to Sit;Rolling Left;Sitting - Scoot to Edge of Bed Rolling Left: 4: Min assist Left Sidelying to Sit: 3: Mod assist;HOB elevated;With rails Sitting - Scoot to Edge of Bed: 4: Min assist Details for Bed Mobility Assistance: increased time required Transfers Transfers: Sit to Stand;Stand to Sit Sit to Stand: 3: Mod assist;With upper extremity assist;From bed Stand to Sit: 3: Mod assist;With upper extremity assist;To chair/3-in-1 Details for Transfer Assistance: Asist to control descent   Exercise General Exercises - Upper Extremity Elbow Flexion: AROM;AAROM;Right;5 reps;Supine Elbow Extension: AROM;AAROM;Right;5 reps;Supine Wrist Flexion: AROM;Right;5 reps;Supine Wrist Extension: AROM;Right;5 reps;Supine Digit Composite Flexion: AROM;Right;5 reps;Seated Composite Extension: AROM;Right;5 reps;Seated  Balance  mod A at times due to lethargy  End of Session OT - End of Session Equipment Utilized During Treatment: Gait belt Activity Tolerance: Patient  limited by pain;Other (comment) (lethargy. discussed with nsg. pt only had 2 percocet today. ) Patient left: in chair;with call bell/phone within reach;with family/visitor present Nurse Communication: Other (comment) (lethargy)  GO     Arnella Pralle,HILLARY 05/07/2012, 4:37 PM [facility], OTR/L  [phone] 05/07/2012

## 2012-05-07 NOTE — Progress Notes (Signed)
OT NOTE  Attempted to see pt this am. Pt too lethargic. Will return later. [facility], OTR/L  [phone] 05/07/2012

## 2012-05-07 NOTE — Progress Notes (Signed)
Patient ID: [name], female   DOB: [birth year], 51 y.o.   MRN: [id number]     Subjective:  Patient reports pain as mild to moderate.  States that she is feeling better this AM.  She would like to advance her diet.  Objective:   VITALS:   Filed Vitals:   05/06/12 1230 05/06/12 1953 05/06/12 2133 05/07/12 0630  BP: 146/102  138/83 120/79  Pulse: 86  89 90  Temp: 96.9 F (36.1 C)  98.7 F (37.1 C) 98 F (36.7 C)  TempSrc: Oral     Resp: 18  18 20   SpO2: 100% 95% 100% 99%    ABD soft Sensation intact distally Incision: dressing C/D/I and no drainage Is able to flex, ext, and abduct all fingers.  LABS  Results for orders placed during the hospital encounter of 05/06/12 (from the past 24 hour(s))  CBC     Status: Abnormal   Collection Time   05/07/12  5:25 AM      Component Value Range   WBC 7.1  4.0 - 10.5 K/uL   RBC 3.55 (*) 3.87 - 5.11 MIL/uL   Hemoglobin 10.5 (*) 12.0 - 15.0 g/dL   HCT 40.9 (*) 81.1 - 91.4 %   MCV 89.0  78.0 - 100.0 fL   MCH 29.6  26.0 - 34.0 pg   MCHC 33.2  30.0 - 36.0 g/dL   RDW 78.2  95.6 - 21.3 %   Platelets 168  150 - 400 K/uL  BASIC METABOLIC PANEL     Status: Normal   Collection Time   05/07/12  5:25 AM      Component Value Range   Sodium 136  135 - 145 mEq/L   Potassium 3.5  3.5 - 5.1 mEq/L   Chloride 99  96 - 112 mEq/L   CO2 26  19 - 32 mEq/L   Glucose, Bld 88  70 - 99 mg/dL   BUN 9  6 - 23 mg/dL   Creatinine, Ser 0.86  0.50 - 1.10 mg/dL   Calcium 8.4  8.4 - 57.8 mg/dL   GFR calc non Af Amer >90  >90 mL/min   GFR calc Af Amer >90  >90 mL/min    Dg Chest 2 View  05/05/2012  *RADIOLOGY REPORT*  Clinical Data: Preoperative respiratory exam.  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  CHEST - 2 VIEW  Comparison: Chest x-rays dated 12/27/2011 and 11/20/2009  Findings: Heart size and pulmonary vascularity are normal.  The patient has bibasilar atelectasis similar to the appearance on the prior chest x-ray.  No effusions.  No  consolidative infiltrates.  Note is made of bilateral flattening of the humeral heads consistent with avascular necrosis.  IMPRESSION: Recurrent atelectasis at the lung bases posteriorly.  Original Report Authenticated By: Gwynn Burly, M.D.   Dg Shoulder Right  05/06/2012  *RADIOLOGY REPORT*  Clinical Data: Postop right shoulder replacement  RIGHT SHOULDER - 2+ VIEW  Comparison: MRI right shoulder dated 12/27/2011  Findings: Right shoulder arthroplasty.  No evidence of hardware complication.  No fracture or dislocation is seen.  Visualized right lung is essentially clear.  IMPRESSION: Right shoulder arthroplasty without evidence of hardware complication.  Original Report Authenticated By: Charline Bills, M.D.    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

## 2012-05-07 NOTE — Progress Notes (Signed)
Orthopedic Tech Progress Note Patient Details:  [name] Apr 16, 1961 [id number]  Patient ID: [name], female   DOB: 1961/03/24, 51 y.o.   MRN: [id number] Confirmed pt has arm sling.  Sadee Osland T 05/07/2012, 8:24 AM

## 2012-05-08 MED ORDER — PROMETHAZINE HCL 25 MG PO TABS: 25.0000 mg | ORAL_TABLET | Freq: Four times a day (QID) | ORAL | Status: DC | PRN

## 2012-05-08 MED ORDER — OXYCODONE-ACETAMINOPHEN 10-325 MG PO TABS: 1.0000 | ORAL_TABLET | Freq: Four times a day (QID) | ORAL | Status: AC | PRN

## 2012-05-08 MED ORDER — METHOCARBAMOL 750 MG PO TABS: 750.0000 mg | ORAL_TABLET | Freq: Four times a day (QID) | ORAL | Status: DC | PRN

## 2012-05-08 NOTE — Discharge Summary (Addendum)
Physician Discharge Summary  Patient ID: [name] MRN: [id number] DOB/AGE: [birth year] 51 y.o.  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 Baker's cyst   . Pulmonary lesion 1/03    on RUL onn CT   . Hemorrhoids   . Zoster 2001  . Family history of diabetes mellitus   . Tobacco user   . Other abnormal Papanicolaou smear of cervix and cervical HPV   . Herpes zoster   . Hemorrhoids   . Baker's cyst   . Pancreatitis   . HPV (human papilloma virus) infection   . HIV disease     take Prezista,Truvada,Norvir,and Isentress daily  . Bipolar disorder     With hx of psychotic features.  . Articular cartilage disorder of knee   . Bursitis of shoulder   . Rotator cuff arthropathy   . Hypertension     takes Clonidine and Verapamil daily  . Emphysema   . Asthma     Albuterol resue inhaler and QVAR daily  . Shortness of breath     with exertion and stress  . Pneumonia     hx of;last time 1 1/77yrs ago  . Headache   . Dizziness   . Arthritis   . Joint pain   . Joint swelling   . Chronic back pain     buldging disc  . Bruises easily   . Gastric ulcer   . GERD (gastroesophageal reflux disease)     takes Nexium daily  . Chronic constipation   . Urinary frequency   . Urinary incontinence   . Nocturia   . History of blood transfusion at age 26  . Early cataracts, bilateral   . Anxiety   . Depression     takes Xanax daily and Depakote bid  . History of shingles   . 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 female who was admitted  05/06/2012 with a diagnosis of Avascular necrosis secondary to drugs and went to the operating room on 05/06/2012 and underwent the above named procedures.    She was given perioperative antibiotics:  Anti-infectives     Start     Dose/Rate Route Frequency Ordered Stop   05/07/12 1000   ritonavir (NORVIR) capsule 100 mg        100 mg Oral Daily 05/06/12 1236     05/07/12 1000   emtricitabine-tenofovir (TRUVADA) 200-300 MG per tablet 1 tablet        1 tablet Oral Daily 05/06/12 1236     05/07/12 0800   Darunavir Ethanolate (PREZISTA) tablet 800 mg        800 mg Oral Daily with breakfast 05/06/12 1236     05/06/12 2200   raltegravir (ISENTRESS) tablet 400 mg        400 mg Oral 2 times daily 05/06/12 1236     05/06/12 1400   ceFAZolin (ANCEF) IVPB 1 g/50 mL premix        1 g 100 mL/hr over 30 Minutes Intravenous Every 6 hours 05/06/12 1236 05/07/12 0356   05/05/12 1418   ceFAZolin (ANCEF) IVPB 2 g/50 mL premix  2 g 100 mL/hr over 30 Minutes Intravenous 60 min pre-op 05/05/12 1418 05/06/12 0810        .  She was given sequential compression devices, early ambulation, and scds for DVT prophylaxis.  She benefited maximally from the hospital stay and there were no complications.    Recent vital signs:  Filed Vitals:   05/09/12 0613  BP: 123/93  Pulse: 106  Temp: 97.5 F (36.4 C)  Resp: 18    Recent laboratory studies:  Lab Results  Component Value Date   HGB 10.5* 05/07/2012   HGB 12.2 05/05/2012   HGB 12.7 03/13/2012   HGB 12.7 03/13/2012   Lab Results  Component Value Date   WBC 7.1 05/07/2012   PLT 168 05/07/2012   Lab Results  Component Value Date   INR 0.91 05/05/2012   Lab Results  Component Value Date   NA 136 05/07/2012   K 3.5 05/07/2012   CL 99 05/07/2012   CO2 26 05/07/2012   BUN 9 05/07/2012   CREATININE 0.50 05/07/2012   GLUCOSE 88 05/07/2012    Discharge Medications:   Medication List  As of 05/09/2012  9:21 AM   STOP taking these medications          cyclobenzaprine 10 MG tablet      diclofenac 75 MG EC tablet      indomethacin 25 MG capsule         TAKE these medications         albuterol 108 (90 BASE) MCG/ACT inhaler   Commonly known as: PROVENTIL HFA;VENTOLIN HFA   Inhale 2 puffs into the lungs every 4 (four) hours as needed. 2 puffs every 4-6 hours as needed for shortness of breath      ALPRAZolam 1 MG tablet   Commonly known as: XANAX   Take 1 mg by mouth 2 (two) times daily. Take 1 tablet by mouth two times a day as needed. For anxiety      beclomethasone 40 MCG/ACT inhaler   Commonly known as: QVAR   Inhale 2 puffs into the lungs 2 (two) times daily. 2 puffs two times a day      cloNIDine 0.1 MG tablet   Commonly known as: CATAPRES   Take 0.1 mg by mouth daily.      darunavir 400 MG tablet   Commonly known as: PREZISTA   Take 800 mg by mouth daily with breakfast.      diazepam 5 MG tablet   Commonly known as: VALIUM   Take 5 mg by mouth every 6 (six) hours as needed. For muscle spasms/anxiety      divalproex 250 MG DR tablet   Commonly known as: DEPAKOTE   1 in AM and 1 at HS      emtricitabine-tenofovir 200-300 MG per tablet   Commonly known as: TRUVADA   Take 1 tablet by mouth daily.      esomeprazole 40 MG capsule   Commonly known as: NEXIUM   Take 1 capsule (40 mg total) by mouth daily before breakfast. Take one capsule by mouth once a day      folic acid 1 MG tablet   Commonly known as: FOLVITE   Take 1 mg by mouth daily.      methocarbamol 750 MG tablet   Commonly known as: ROBAXIN   Take 1 tablet (750 mg total) by mouth 4 (four) times daily as needed. For muscle spasms      oxyCODONE-acetaminophen 10-325 MG per  tablet   Commonly known as: PERCOCET   Take 1-2 tablets by mouth every 6 (six) hours as needed for pain. MAXIMUM TOTAL ACETAMINOPHEN DOSE IS 4000 MG PER DAY      predniSONE 10 MG tablet   Commonly known as: DELTASONE   Take 10 mg by mouth as directed.      promethazine 25 MG  tablet   Commonly known as: PHENERGAN   Take 1 tablet (25 mg total) by mouth every 6 (six) hours as needed for nausea.      raltegravir 400 MG tablet   Commonly known as: ISENTRESS   Take 400 mg by mouth 2 (two) times daily.      ritonavir 100 MG capsule   Commonly known as: NORVIR   Take 1 capsule (100 mg total) by mouth daily.      verapamil 240 MG CR tablet   Commonly known as: CALAN-SR   Take 240 mg by mouth at bedtime.      VITAMIN B-12 IJ   Inject as directed every 30 (thirty) days.      Vitamin D (Ergocalciferol) 50000 UNITS Caps   Commonly known as: DRISDOL   Take 50,000 Units by mouth. Take on mondays            Diagnostic Studies: Dg Chest 2 View  05/05/2012  *RADIOLOGY REPORT*  Clinical Data: Preoperative respiratory exam.  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  CHEST - 2 VIEW  Comparison: Chest x-rays dated 12/27/2011 and 11/20/2009  Findings: Heart size and pulmonary vascularity are normal.  The patient has bibasilar atelectasis similar to the appearance on the prior chest x-ray.  No effusions.  No consolidative infiltrates.  Note is made of bilateral flattening of the humeral heads consistent with avascular necrosis.  IMPRESSION: Recurrent atelectasis at the lung bases posteriorly.  Original Report Authenticated By: Gwynn Burly, M.D.   Dg Shoulder Right  05/06/2012  *RADIOLOGY REPORT*  Clinical Data: Postop right shoulder replacement  RIGHT SHOULDER - 2+ VIEW  Comparison: MRI right shoulder dated 12/27/2011  Findings: Right shoulder arthroplasty.  No evidence of hardware complication.  No fracture or dislocation is seen.  Visualized right lung is essentially clear.  IMPRESSION: Right shoulder arthroplasty without evidence of hardware complication.  Original Report Authenticated By: Charline Bills, M.D.    Disposition: 01-Home or Self Care  Discharge Orders    Future Appointments: Provider: Department: Dept Phone: Center:   05/16/2012 10:15 AM Ginnie Smart, MD [facility] [phone] [facility]   07/01/2012 10:45 AM Kerri Perches, MD [facility] [phone] [facility]     Future Orders Please Complete By Expires   Diet general      Call MD / Call 911      Comments:   If you experience chest pain or shortness of breath, CALL 911 and be transported to the hospital emergency room.  If you develope a fever above 101 F, pus (white drainage) or increased drainage or redness at the wound, or calf pain, call your surgeon's office.   Discharge instructions      Comments:   Change dressing in 3 days and reapply fresh dressing, unless you have a splint (half cast).  If you have a splint/cast, just leave in place until your follow-up appointment.    Keep wounds dry for 3 weeks.  Leave steri-strips in place on skin.  Do not apply lotion or anything to the wound.   Constipation Prevention  Comments:   Drink plenty of fluids.  Prune juice may be helpful.  You may use a stool softener, such as Colace (over the counter) 100 mg twice a day.  Use MiraLax (over the counter) for constipation as needed.      Follow-up Information    Follow up with Eulas Post, MD. Schedule an appointment as soon as possible for a visit in 2 weeks.   Contact information:   [facility] [address], [address] [place] [address] [phone]           Signed: Eulas Post 05/09/2012, 9:21 AM

## 2012-05-08 NOTE — Care Management Note (Signed)
    Page 1 of 1   05/08/2012     2:26:27 PM   CARE MANAGEMENT NOTE 05/08/2012  Patient:  [name], [name]   Account Number:  [id number]  Date Initiated:  05/08/2012  Documentation initiated by:  Anette Guarneri  Subjective/Objective Assessment:   POD#2 s/p Right TSA  Has CAPS aide 5h/d x5d/wk     Action/Plan:   home with [facility] services   Anticipated DC Date:  05/09/2012   Anticipated DC Plan:  HOME W HOME HEALTH SERVICES      DC Planning Services  CM consult      Choice offered to / List presented to:  C-1 Patient        HH arranged  HH-2 PT  HH-3 OT      [facility] agency  [facility]   Status of service:  In process, will continue to follow Medicare Important Message given?   (If response is "NO", the following Medicare IM given date fields will be blank) Date Medicare IM given:   Date Additional Medicare IM given:    Discharge Disposition:  HOME W HOME HEALTH SERVICES  Per UR Regulation:  Reviewed for med. necessity/level of care/duration of stay  If discussed at Long Length of Stay Meetings, dates discussed:    Comments:  05/08/12  11:00 Anette Guarneri RN/CM patient choice for HHPT/OT is [facility] referral sent via [facility]

## 2012-05-08 NOTE — Progress Notes (Signed)
Physical Therapy Evaluation Note  Past Medical History  Diagnosis Date  . Baker's cyst   . Pulmonary lesion 1/03    on RUL onn CT   . Hemorrhoids   . Zoster 2001  . Family history of diabetes mellitus   . Tobacco user   . Other abnormal Papanicolaou smear of cervix and cervical HPV   . Herpes zoster   . Hemorrhoids   . Baker's cyst   . Pancreatitis   . HPV (human papilloma virus) infection   . HIV disease     take Prezista,Truvada,Norvir,and Isentress daily  . Bipolar disorder     With hx of psychotic features.  . Articular cartilage disorder of knee   . Bursitis of shoulder   . Rotator cuff arthropathy   . Hypertension     takes Clonidine and Verapamil daily  . Emphysema   . Asthma     Albuterol resue inhaler and QVAR daily  . Shortness of breath     with exertion and stress  . Pneumonia     hx of;last time 1 1/79yrs ago  . Headache   . Dizziness   . Arthritis   . Joint pain   . Joint swelling   . Chronic back pain     buldging disc  . Bruises easily   . Gastric ulcer   . GERD (gastroesophageal reflux disease)     takes Nexium daily  . Chronic constipation   . Urinary frequency   . Urinary incontinence   . Nocturia   . History of blood transfusion at age 89  . Early cataracts, bilateral   . Anxiety   . Depression     takes Xanax daily and Depakote bid  . History of shingles   . 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 Right breast lumpectomy   . Total abdominal hysterectomy w/ bilateral salpingoophorectomy at age 38  . Breast surgery as a teenager    rt breast biopsy  . Cholecystectomy 10+yrs ago  . Knee arthroscopy     bilateral  . Appendectomy     with hysterectomy  . Finger surgery     rt middle finger with pin in it  . Epidural injections   . Esophagogastroduodenoscopy   . Shoulder hemi-arthroplasty 05/06/2012    Procedure: SHOULDER HEMI-ARTHROPLASTY;  Surgeon:  Eulas Post, MD;  Location: [facility] OR;  Service: Orthopedics;  Laterality: Right;     05/08/12 1006  PT Visit Information  Last PT Received On 05/08/12  Assistance Needed +2  PT Time Calculation  PT Start Time 1002  PT Stop Time 1036  PT Time Calculation (min) 34 min  Subjective Data  Subjective Pt received supine in bed with report of "I'm getting a cramp." Pt agreeable to OOB mobility however demonstrates lethargy and confusion.  Precautions  Precautions Shoulder  Type of Shoulder Precautions No ROM shoulder. sling at all times. NWB  Precaution Booklet Issued Yes (comment)  Required Braces or Orthoses Other Brace/Splint (sling - with abduction strap)  Restrictions  Weight Bearing Restrictions Yes  RUE Weight Bearing NWB  Other Position/Activity Restrictions pillow behind r shoulder  Home Living  Lives With Significant other  Available Help at Discharge Personal care attendant;Family;Available 24 hours/day  Type of Home House  Home Access Stairs to enter  Entrance Stairs-Number of Steps 4  Entrance Stairs-Rails Right;Left;Can reach both  Home Layout One level  Bathroom Shower/Tub Tub/shower unit;Curtain  Bathroom Company secretary Yes  How Accessible Accessible via walker  Home Adaptive Equipment Bedside commode/3-in-1;Hand-held shower hose;Straight cane;Tub transfer bench;Walker - rolling  Additional Comments Has PCA 5 days/wk; 5 hrs/day.  Prior Function  Level of Independence Independent with assistive device(s);Needs assistance  Needs Assistance Dressing;Meal Prep;Light Housekeeping  Dressing Minimal  Meal Prep Total  Light Housekeeping Total  Able to Take Stairs? Yes  Driving No  Vocation Unemployed  Comments pt has personal care attendent but patient reports she has "CAPS"  Communication  Communication Expressive difficulties (pt with delayed response time )  Cognition  Overall Cognitive Status Impaired  Area of Impairment  Attention;Memory;Following commands;Safety/judgement;Problem solving  Arousal/Alertness Lethargic  Behavior During Session Lethargic  Current Attention Level Selective  Attention - Other Comments easily distracted, difficulty staying on task  Memory Decreased recall of precautions  Memory Deficits inconsistent report of home set up and assist, inability to demo carry over when trying to use cane for std pvt transfer  Following Commands Follows one step commands inconsistently  Safety/Judgement Decreased awareness of safety precautions;Decreased safety judgement for tasks assessed;Decreased awareness of need for assistance  Problem Solving minimal  Right Upper Extremity Assessment  RUE ROM/Strength/Tone Deficits  RUE ROM/Strength/Tone Deficits pt attempts to use R hand to eat and use phone while in sling  Left Upper Extremity Assessment  LUE ROM/Strength/Tone WFL for tasks assessed  Right Lower Extremity Assessment  RLE ROM/Strength/Tone Deficits;Due to pain  RLE ROM/Strength/Tone Deficits pt grossly 3/5  Left Lower Extremity Assessment  LLE ROM/Strength/Tone Deficits;Due to pain  LLE ROM/Strength/Tone Deficits pt grossly 3/5  Trunk Assessment  Trunk Assessment Normal  Bed Mobility  Bed Mobility Supine to Sit  Supine to Sit 2: Max assist  Details for Bed Mobility Assistance pt with delayed processing, max directional v/c's, increased pain with mobility. assist for trunk elevation  Transfers  Transfers Sit to Stand;Stand to Sit;Stand Pivot Transfers  Sit to Stand 2: Max assist;With upper extremity assist;From bed  Stand to Sit 3: Mod assist;With upper extremity assist;To chair/3-in-1  Stand Pivot Transfers 1: +2 Total assist  Stand Pivot Transfers: Patient Percentage 50%  Details for Transfer Assistance attempted to stand pivot with quad cane however patient unable to advance either R or L foot due to pain. Patient reports she fell and had "laproscopic" surgery on them. Patient unable  to use quad cane in L UE successfully ot assist in transfer. maxAx2 to safely assist to chair. pt with poor command follow.  Ambulation/Gait  Ambulation/Gait Assistance Not tested (comment) (pt unable to take a step with either foot)  PT - End of Session  Equipment Utilized During Treatment Gait belt (R UE sling)  Activity Tolerance Patient limited by fatigue;Patient limited by pain (lethargy)  Patient left in chair;with call bell/phone within reach (sitter present)  Nurse Communication Mobility status  PT Assessment  Clinical Impression Statement Pt s/p R TSA presenting with significant cognitive and mobility impairments. Patient currently requires maxA for all mobiltiy and ADLs. Patient with steps to enter home in which she can not complete. Pt will need to be bumped up backwards in w/c by significant other + additional person for safe entry into home. Unsure if significant other will be available to do so. Pt currently unsafe to return home as planned by MD for today. [name], [place] notified. Patient with inconsistent report of assist at home and capabilities prior to surgery. Per OT patient's PCA and personal care attendent available however unable to due education  this day due to significant other not present. Patient may need ST-SNF if patient remains lethargic and con't to require maxA for all mobiltiy if appropriate support can't be provided at home. Patient con't to have cognitive deficits inhibiting active participation in PT with modified techniques due to R UE NWB.  PT Recommendation/Assessment Patient needs continued PT services  PT Problem List Decreased strength;Decreased range of motion;Decreased activity tolerance;Decreased balance;Decreased coordination;Decreased safety awareness;Decreased knowledge of precautions  Barriers to Discharge Decreased caregiver support;Inaccessible home environment  Barriers to Discharge Comments unsure of assistance available at home - pt requires 24/7  assist  PT Therapy Diagnosis  Difficulty walking;Abnormality of gait;Generalized weakness;Acute pain  PT Plan  PT Frequency Min 5X/week  PT Treatment/Interventions DME instruction;Gait training;Stair training;Functional mobility training;Therapeutic activities;Therapeutic exercise  PT Recommendation  Follow Up Recommendations Home health PT;Supervision/Assistance - 24 hour (may need SNF if 24/7 assist can't be provided)  Equipment Recommended Wheelchair (measurements);Small-based quad cane;3 in 1 bedside comode  Individuals Consulted  Consulted and Agree with Results and Recommendations Patient unable/family or caregiver not available  Acute Rehab PT Goals  PT Goal Formulation Patient unable to participate in goal setting  Time For Goal Achievement 05/15/12  Potential to Achieve Goals Good  Pt will go Supine/Side to Sit with supervision;with HOB 0 degrees  PT Goal: Supine/Side to Sit - Progress Goal set today  Pt will go Sit to Supine/Side with supervision;with HOB 0 degrees  PT Goal: Sit to Supine/Side - Progress Goal set today  Pt will Transfer Bed to Chair/Chair to Bed with supervision (with quad cane/hemi-walker)  PT Transfer Goal: Bed to Chair/Chair to Bed - Progress Goal set today  Pt will Ambulate 1 - 15 feet;with supervision (with quad cane or hemi-walker.)  PT Goal: Ambulate - Progress Goal set today  Pt will Go Up / Down Stairs 3-5 stairs;with mod assist;with rail(s) (or significant other to bump up backwards in w/c.)  PT Goal: Up/Down Stairs - Progress Goal set today  PT General Charges  $$ ACUTE PT VISIT 1 Procedure  PT Evaluation  $Initial PT Evaluation Tier III 1 Procedure  Written Expression  Dominant Hand Right     Lewis Shock, PT, DPT Pager #: [phone] Office #: [phone]

## 2012-05-08 NOTE — Progress Notes (Signed)
Occupational Therapy Treatment Patient Details Name: [name] MRN: [id number] DOB: 06/24/1961 Today's Date: 05/08/2012 Time: 1478-2956 OT Time Calculation (min): 31 min  OT Assessment / Plan / Recommendation Comments on Treatment Session Better performance this afternoon. Pt still lethargic and confused. Used a hemiwalker with pt and pt able to slowly shuffle @ 15 steps to chair. Pt agitated at times. Not understand why she can not "just stay in bed". Politely explained to pt.  Pt unable to recall shoulder precautions. Boyfriend not here during the day today. will need to educate boyfriend again tomorrow prior to D/C. Rec HH services.    Follow Up Recommendations  Home health OT    Barriers to Discharge   none    Equipment Recommendations       Recommendations for Other Services    Frequency Min 3X/week   Plan Discharge plan remains appropriate    Precautions / Restrictions Precautions Precautions: Shoulder Type of Shoulder Precautions: No ROM shoulder. sling at all times. NWB Precaution Booklet Issued: Yes (comment) Required Braces or Orthoses: Other Brace/Splint Restrictions RUE Weight Bearing: Non weight bearing   Pertinent Vitals/Pain Unable to rate pain. Does not appear to be in pain.    ADL  ADL Comments: Sling mocified so that waist strap can be fastened. Pt must wear waist strap, otherwise, she will use RUE.     OT Diagnosis:    OT Problem List:   OT Treatment Interventions:     OT Goals Acute Rehab OT Goals OT Goal Formulation: With patient Time For Goal Achievement: 05/14/12 Potential to Achieve Goals: Good ADL Goals Pt Will Perform Grooming: with min assist;Supported;Sitting, chair;with cueing (comment type and amount) ADL Goal: Grooming - Progress: Progressing toward goals Pt Will Perform Upper Body Bathing: with min assist;with caregiver independent in assisting;Sitting, chair;Supported;with cueing (comment type and amount) ADL Goal: Upper Body  Bathing - Progress: Progressing toward goals Pt Will Perform Upper Body Dressing: with min assist;with caregiver independent in assisting;Sitting, chair;Supported;with cueing (comment type and amount) ADL Goal: Upper Body Dressing - Progress: Progressing toward goals Pt Will Transfer to Toilet: with min assist;with caregiver independent in assisting;with DME;3-in-1;Maintaining weight bearing status ADL Goal: Toilet Transfer - Progress: Progressing toward goals Pt Will Perform Toileting - Clothing Manipulation: with mod assist;Standing;with cueing (comment type and amount) ADL Goal: Toileting - Clothing Manipulation - Progress: Progressing toward goals Pt Will Perform Toileting - Hygiene: with min assist;with caregiver independent in assisting;Sit to stand from 3-in-1/toilet ADL Goal: Toileting - Hygiene - Progress: Progressing toward goals Additional ADL Goal #1: Pt's family will be independent with sling mgnt and positioniong of RUE in/out of sling. ADL Goal: Additional Goal #1 - Progress: Progressing toward goals Additional ADL Goal #2: Pt/family will be independent with A/AAROM RUE with the exception of o ROM R shoulder.q ADL Goal: Additional Goal #2 - Progress: Progressing toward goals  Visit Information  Last OT Received On: 05/08/12 Assistance Needed: +1    Subjective Data      Prior Functioning       Cognition  Overall Cognitive Status: Impaired Area of Impairment: Attention;Memory;Following commands;Safety/judgement;Problem solving Arousal/Alertness: Lethargic Behavior During Session: Lethargic Current Attention Level: Selective Attention - Other Comments: easily distracted, difficulty staying on task Memory: Decreased recall of precautions Memory Deficits: inconsistent report of home set up and assist, inability to demo carry over when trying to use cane for std pvt transfer Following Commands: Follows one step commands inconsistently Safety/Judgement: Decreased awareness  of safety precautions;Decreased safety judgement for  tasks assessed;Decreased awareness of need for assistance Problem Solving: minimal    Mobility Bed Mobility Bed Mobility: Rolling Left;Right Sidelying to Sit Rolling Left: 5: Supervision Left Sidelying to Sit: 4: Min assist;With rails;HOB elevated Transfers Transfers: Sit to Stand;Stand to Sit Sit to Stand: 4: Min assist;With upper extremity assist;From bed Stand to Sit: 3: Mod assist;With upper extremity assist;To chair/3-in-1 Details for Transfer Assistance: contiuous vc to not use RUE for transfers. Pt requires increased time due to apparent cognitive deficits.   Exercises General Exercises - Upper Extremity Elbow Flexion: PROM;Right;10 reps;Seated Elbow Extension: AROM;Right;10 reps;Seated Wrist Flexion: AROM;10 reps;Sidelying;Right Wrist Extension: AROM;Right;10 reps;Seated Digit Composite Flexion: AROM;Right;10 reps;Seated  Balance    End of Session OT - End of Session Equipment Utilized During Treatment: Gait belt Activity Tolerance: Patient limited by fatigue;Other (comment) (cognitive status) Patient left: in chair;with call bell/phone within reach;with nursing in room;Other (comment) Psychiatrist) Nurse Communication: Mobility status  GO     Zeek Rostron,HILLARY 05/08/2012, 4:03 PM [facility], OTR/L  [phone] 05/08/2012

## 2012-05-09 ENCOUNTER — Ambulatory Visit: Payer: Medicare Other | Admitting: Infectious Diseases

## 2012-05-09 NOTE — Progress Notes (Signed)
Occupational Therapy Treatment Patient Details Name: LAQUANNA VEAZEY MRN: [id number] DOB: [birth year] Today's Date: 05/09/2012 Time: 1205-1218 OT Time Calculation (min): 13 min  OT Assessment / Plan / Recommendation Comments on Treatment Session Pt lethargic. Pt using RUE as she choose to reach for objects. Repetitive instructions to not use RUE. Sling repositioned and waist strap applied to limit use of R shoulder. Pt non compliant. Feel that pt is at functional cognitive baseline. PtOK to D/C . HHOT to follow.    Follow Up Recommendations  Home health OT    Barriers to Discharge   none    Equipment Recommendations    SBQC   Recommendations for Other Services  none  Frequency Min 3X/week   Plan Discharge plan remains appropriate    Precautions / Restrictions Precautions Precautions: Shoulder Type of Shoulder Precautions: No ROM shoulder. sling at all times. NWB Precaution Booklet Issued: Yes (comment) Required Braces or Orthoses: Other Brace/Splint Restrictions Weight Bearing Restrictions: Yes RUE Weight Bearing: Non weight bearing Other Position/Activity Restrictions: pillow behind r shoulder   Pertinent Vitals/Pain Pt states 10 although she is falling asleep    ADL  ADL Comments: readjusted sling to limit use of R shoulder.    OT Diagnosis:    OT Problem List:   OT Treatment Interventions:     OT Goals Acute Rehab OT Goals OT Goal Formulation: With patient Time For Goal Achievement: 05/14/12 Potential to Achieve Goals: Good ADL Goals Pt Will Perform Grooming: with min assist;Supported;Sitting, chair;with cueing (comment type and amount) ADL Goal: Grooming - Progress: Met Pt Will Perform Upper Body Bathing: with min assist;with caregiver independent in assisting;Sitting, chair;Supported;with cueing (comment type and amount) ADL Goal: Upper Body Bathing - Progress: Met Pt Will Perform Upper Body Dressing: with min assist;with caregiver independent in  assisting;Sitting, chair;Supported;with cueing (comment type and amount) ADL Goal: Upper Body Dressing - Progress: Progressing toward goals Pt Will Transfer to Toilet: with min assist;with caregiver independent in assisting;with DME;3-in-1;Maintaining weight bearing status ADL Goal: Toilet Transfer - Progress: Met Pt Will Perform Toileting - Clothing Manipulation: with mod assist;Standing;with cueing (comment type and amount) ADL Goal: Toileting - Clothing Manipulation - Progress: Met Pt Will Perform Toileting - Hygiene: with min assist;with caregiver independent in assisting;Sit to stand from 3-in-1/toilet ADL Goal: Toileting - Hygiene - Progress: Met Additional ADL Goal #1: Pt's family will be independent with sling mgnt and positioniong of RUE in/out of sling. ADL Goal: Additional Goal #1 - Progress: Met Additional ADL Goal #2: Pt/family will be independent with A/AAROM RUE with the exception of o ROM R shoulder.q ADL Goal: Additional Goal #2 - Progress: Progressing toward goals  Visit Information  Last OT Received On: 05/09/12 Assistance Needed: +1    Subjective Data      Prior Functioning       Cognition  Overall Cognitive Status: Impaired Area of Impairment: Attention;Memory;Following commands;Safety/judgement;Problem solving Arousal/Alertness: Lethargic Orientation Level: Disoriented to;Time;Place Behavior During Session: Lethargic Current Attention Level: Selective Attention - Other Comments: easily distracted, difficulty staying on task Memory: Decreased recall of precautions Memory Deficits: inconsistent report of home set up and assist, inability to demo carry over when trying to use cane for std pvt transfer Following Commands: Follows one step commands inconsistently Safety/Judgement: Decreased awareness of safety precautions;Decreased safety judgement for tasks assessed;Decreased awareness of need for assistance Safety/Judgement - Other Comments: poor Problem  Solving: minimal    Mobility   min A  Exercises General Exercises - Upper Extremity Elbow Flexion: AROM;Right;10 reps;Seated  Elbow Extension: AROM;Right;10 reps;Seated Wrist Flexion: AROM;Right;10 reps;Seated Wrist Extension: AROM;Right;10 reps;Seated Digit Composite Flexion: AROM;Right;10 reps;Seated Composite Extension: AROM;Right;10 reps;Seated  Balance  fair. Requires 24/7 supervision  End of Session OT - End of Session Activity Tolerance: Other (comment) (limited by lethargy. unable to stay awake for sesion.) Patient left: in chair;with call bell/phone within reach;with nursing in room Psychiatrist) Nurse Communication: Other (comment) (OK for D/C)  GO     Jaylenn Baiza,HILLARY 05/09/2012, 12:27 PM [facility], OTR/L  [phone] 05/09/2012

## 2012-05-09 NOTE — Progress Notes (Addendum)
Physical Therapy Treatment Patient Details Name: [name] MRN: [id number] DOB: May 13, 1961 Today's Date: 05/09/2012 Time: 4098-1191 PT Time Calculation (min): 23 min  PT Assessment / Plan / Recommendation Comments on Treatment Session  Pt admitted s/p right TSA and continues to be limited by impaired cognition causing pt to be a high fall risk.  Pt also continues to weight bear through right UE as well as not use right UE sling even with continual cues throughout session.  Pt was able to tolerate ambulation today with min assist only.  Will need 24 hour assistance to safely d/c home.  Spoke with case manager about PT recommendations after session.  Case manager spoke with pt's boyfriend, who reports 24 hour assistance will be available.  Stressed importance of 24 hour assistance for safety with option of STSNF mentioned if assist not available.    Follow Up Recommendations  Home health PT;Supervision/Assistance - 24 hour (STSNF if 24 hour assist not available.)    Barriers to Discharge        Equipment Recommendations  Wheelchair (measurements);Small-based quad cane;3 in 1 bedside comode    Recommendations for Other Services    Frequency Min 5X/week   Plan Discharge plan remains appropriate;Frequency remains appropriate    Precautions / Restrictions Precautions Precautions: Shoulder Type of Shoulder Precautions: No ROM right shoulder. Right sling at all times. NWB Right UE. Precaution Booklet Issued: No Required Braces or Orthoses: Other Brace/Splint (Sling right UE.) Restrictions Weight Bearing Restrictions: Yes RUE Weight Bearing: Non weight bearing Other Position/Activity Restrictions: pillow behind r shoulder   Pertinent Vitals/Pain None    Mobility  Bed Mobility Bed Mobility: Not assessed Transfers Transfers: Sit to Stand;Stand to Sit (2 trials.) Sit to Stand: 4: Min assist;From bed Stand to Sit: 4: Min assist;To bed;To chair/3-in-1 Details for Transfer  Assistance: Assist for balance with constant cues to maintain NWBing right UE and no ROM at right shoulder.   Ambulation/Gait Ambulation/Gait Assistance: 4: Min assist Ambulation Distance (Feet): 30 Feet Assistive device: Hemi-walker Ambulation/Gait Assistance Details: Assist for balance due to impaired cognition with decreased safety awareness, decreased orientation to situation, and decreased problem solving.  Chair follow due to impulsivity and decreased cognition with treatment. Gait Pattern: Decreased stride length;Shuffle;Antalgic Stairs: No (unable to trial stairs this treatment due to decreased attention and safety with mobility at present time). Wheelchair Mobility Wheelchair Mobility: No    Exercises General Exercises - Upper Extremity Elbow Flexion: AROM;Right;10 reps;Seated Elbow Extension: AROM;Right;10 reps;Seated Wrist Flexion: AROM;Right;10 reps;Seated Wrist Extension: AROM;Right;10 reps;Seated Digit Composite Flexion: AROM;Right;10 reps;Seated Composite Extension: AROM;Right;10 reps;Seated   PT Diagnosis:    PT Problem List:   PT Treatment Interventions:     PT Goals Acute Rehab PT Goals PT Goal Formulation: Patient unable to participate in goal setting Time For Goal Achievement: 05/15/12 Potential to Achieve Goals: Good PT Goal: Ambulate - Progress: Progressing toward goal  Visit Information  Last PT Received On: 05/09/12 Assistance Needed: +1    Subjective Data  Subjective: "I wish they would decide if I am staying or going." Patient Stated Goal: Get well.   Cognition  Overall Cognitive Status: Impaired Area of Impairment: Attention;Memory;Following commands;Safety/judgement;Problem solving Arousal/Alertness: Lethargic Orientation Level: Disoriented to;Time;Place;Situation Behavior During Session: Lethargic Current Attention Level: Selective Attention - Other Comments: easily distracted, difficulty staying on task Memory: Decreased recall of  precautions Memory Deficits: inconsistent report of home set up and assist, inability to demo carry over when trying to use cane for std pvt transfer Following Commands:  Follows one step commands inconsistently Safety/Judgement: Decreased awareness of safety precautions;Decreased safety judgement for tasks assessed;Decreased awareness of need for assistance Safety/Judgement - Other Comments: poor Problem Solving: minimal Cognition - Other Comments: Question if pt's baseline cognition is impaired and/or if medication is impairing cognition.    Balance  Balance Balance Assessed: No  End of Session PT - End of Session Equipment Utilized During Treatment: Gait belt;Other (comment) (Right UE sling.) Activity Tolerance: Patient tolerated treatment well;Patient limited by fatigue Patient left: in chair;with call bell/phone within reach;Other (comment) (With sitter present.) Nurse Communication: Mobility status   GP     Cephus Shelling 05/09/2012, 1:51 PM  05/09/2012 Cephus Shelling, PT, DPT [phone]

## 2012-05-09 NOTE — Progress Notes (Signed)
Discharge instructions and prescriptions given to patient.  Patient was impulsive and talking over nurse during explanation of discharge instructions.  Instructions reviewed with patient spouse.  Patient instructed to call Dr. Dion Saucier office should any issues arise prior to appointment.  All belongings taken by patient and spouse.  Manisha Cancel N

## 2012-05-09 NOTE — Progress Notes (Signed)
The patient stayed overnight fracture day for additional physical therapy, because of difficulty with ambulation. She is planning to be discharged today. Please see the updated discharge summary.

## 2012-05-15 ENCOUNTER — Telehealth: Payer: Self-pay

## 2012-05-15 NOTE — Telephone Encounter (Signed)
I advise ecvaluation, I have no availabilityy this week, if Dr Jeanice Lim does she may come here or if not go to urgent care or ED. Earlier you stated she had missed the night clonidine I advised she take it

## 2012-05-15 NOTE — Telephone Encounter (Signed)
She was advised ED but refused per the nurse. Dr Jeanice Lim has no availability and is off tomorrow

## 2012-05-15 NOTE — Telephone Encounter (Signed)
Encounter closed

## 2012-05-15 NOTE — Telephone Encounter (Signed)
Out seeing her because of her shoulder surgery and she fell asleep lastnight and forgot to take her clonidine and now she is having headaches and her BP is 140/120 and she refuses to go to the hospital. She did take her verapamil this am and the nurse wants to know if you have any orders for her

## 2012-05-16 ENCOUNTER — Ambulatory Visit: Payer: Medicare Other | Admitting: Infectious Diseases

## 2012-05-20 ENCOUNTER — Telehealth: Payer: Self-pay

## 2012-05-20 NOTE — Telephone Encounter (Signed)
Noted med compliance needed for BP control

## 2012-05-20 NOTE — Telephone Encounter (Signed)
Byrd Hesselbach (nurse) [facility] called and said that her BP was 130/100 and she was very agitated and didn't want to let her go through her meds and when she was finally able to it didn't look like she had been taking them as she should. Wanted you to be aware

## 2012-05-22 ENCOUNTER — Ambulatory Visit: Payer: Medicare Other | Admitting: Family Medicine

## 2012-05-28 ENCOUNTER — Ambulatory Visit: Payer: Medicare Other | Admitting: Family Medicine

## 2012-06-06 ENCOUNTER — Encounter: Payer: Self-pay | Admitting: Family Medicine

## 2012-07-01 ENCOUNTER — Encounter: Payer: Self-pay | Admitting: Family Medicine

## 2012-07-01 ENCOUNTER — Ambulatory Visit (INDEPENDENT_AMBULATORY_CARE_PROVIDER_SITE_OTHER): Payer: Medicare Other | Admitting: Family Medicine

## 2012-07-01 ENCOUNTER — Telehealth: Payer: Self-pay

## 2012-07-01 VITALS — BP 150/100 | HR 126 | Resp 18 | Ht 64.0 in | Wt 181.0 lb

## 2012-07-01 DIAGNOSIS — R300 Dysuria: Secondary | ICD-10-CM

## 2012-07-01 DIAGNOSIS — F329 Major depressive disorder, single episode, unspecified: Secondary | ICD-10-CM

## 2012-07-01 DIAGNOSIS — R32 Unspecified urinary incontinence: Secondary | ICD-10-CM | POA: Insufficient documentation

## 2012-07-01 DIAGNOSIS — F17200 Nicotine dependence, unspecified, uncomplicated: Secondary | ICD-10-CM

## 2012-07-01 DIAGNOSIS — M549 Dorsalgia, unspecified: Secondary | ICD-10-CM

## 2012-07-01 DIAGNOSIS — N3000 Acute cystitis without hematuria: Secondary | ICD-10-CM

## 2012-07-01 DIAGNOSIS — I10 Essential (primary) hypertension: Secondary | ICD-10-CM

## 2012-07-01 DIAGNOSIS — IMO0002 Reserved for concepts with insufficient information to code with codable children: Secondary | ICD-10-CM

## 2012-07-01 MED ORDER — CLONIDINE HCL 0.3 MG PO TABS: ORAL_TABLET | ORAL | Status: DC

## 2012-07-01 MED ORDER — LEVOFLOXACIN 500 MG PO TABS: 500.0000 mg | ORAL_TABLET | Freq: Every day | ORAL | Status: DC

## 2012-07-01 MED ORDER — KETOROLAC TROMETHAMINE 60 MG/2ML IJ SOLN
60.0000 mg | Freq: Once | INTRAMUSCULAR | Status: AC
  Administered 2012-07-01: 60 mg via INTRAMUSCULAR

## 2012-07-01 NOTE — Patient Instructions (Addendum)
You are referred to urology regarding urinary incontinence.  Your clonidine dose is increased to 0.3mg  at bedtime since blood pressure is still high.  You need the flu vaccine.  You need to establish with a new primary care Doctor , notification was sent since 06/06/2012.  Toradol 60mg  im in office for back pain.  Urine is not normal , I will send in 5 day course of antibiotic and follow up on culture  Mammogram is overdue , you need to schedule

## 2012-07-01 NOTE — Progress Notes (Signed)
  Subjective:    Patient ID: [name], female    DOB: [birth year], 51 y.o.   MRN: [id number]  HPI  Pt in today , had  Right shoulder surgery on August 16 by Dr Dion Saucier  Still having a lot  A lot of knee problems, has been followed by Dr Hilda Lias for years,, has had arthroscopy oon each knee once only, recently sent to [facility] by Dr Hilda Lias, states that [facility] she saw told her he would not be able to do what she needs, has release form for MRI report from [facility], planning to have [place] orth[facility] look at knees eventually.  Upcoming left shoulder is to be done also Malodorous urine and frequency x more than 6 month Review of Systems See HPI Denies recent fever or chills. Denies sinus pressure, nasal congestion, ear pain or sore throat. Denies chest congestion, productive cough or wheezing. Denies chest pains, palpitations and leg swelling Denies abdominal pain, nausea, vomiting,diarrhea or constipation.   Denies headaches, seizures, numbness, or tingling. Denies depression, anxiety or insomnia. Denies skin break down or rash.        Objective:   Physical Exam  Patient alert and oriented and in no cardiopulmonary distress.  HEENT: No facial asymmetry, EOMI, no sinus tenderness,  oropharynx pink and moist.  Neck decreased ROMno adenopathy.  Chest: Clear to auscultation bilaterally.decreased air entry throughout  CVS: S1, S2 no murmurs, no S3.  ABD: Soft non tender. Bowel sounds normal.  Ext: No edema  MS: decreased  ROM spine, shoulders, hips and knees.  Skin: Intact, no ulcerations or rash noted.  Psych: Good eye contact, normal affect. Memory mildly impaired, mldly anxious or depressed appearing.  CNS: CN 2-12 intact, power, tone and sensation normal throughout.       Assessment & Plan:

## 2012-07-03 LAB — URINE CULTURE: Colony Count: 50000

## 2012-07-04 ENCOUNTER — Telehealth: Payer: Self-pay | Admitting: Family Medicine

## 2012-07-04 NOTE — Telephone Encounter (Signed)
Any med adjustment need to be made?

## 2012-07-04 NOTE — Telephone Encounter (Signed)
Pt needs to go to the ED if her heart rate is 150. It was not that high when she was last here. Clonidine was increased to a 0.3mg  dose, which should slow the heart rate and lower the pressure. Not sure if she is actually taking the new dose.  Needs an appt with me on Monday re blood pressure and heart rate, pls get appt info from front office staff so she has this info  THIS IS IMPORTANT

## 2012-07-07 ENCOUNTER — Other Ambulatory Visit: Payer: Self-pay | Admitting: Family Medicine

## 2012-07-07 MED ORDER — FLUCONAZOLE 150 MG PO TABS: ORAL_TABLET | ORAL | Status: DC

## 2012-07-07 NOTE — Telephone Encounter (Signed)
Noted, thanks!

## 2012-07-07 NOTE — Telephone Encounter (Signed)
Spoke with patient and she stated that she received message but was unable to return call.  She said that she is feeling better and is taking Clonidine 0.3mg  as prescribed.  She is trying to rest more.  She is going to schedule an appointment for this week.  Is unable to come today due to transportation.  She also states that she has a "boil" on her buttocks that she would like assessed.

## 2012-07-07 NOTE — Telephone Encounter (Signed)
See previous message

## 2012-07-07 NOTE — Telephone Encounter (Signed)
Please contact pt, she needs OV re uncontrolled blood pressure and fast heart rate Neurology called on Friday we have been trying to get her since then this issue will be addressed at visit IMPT

## 2012-07-08 ENCOUNTER — Encounter: Payer: Self-pay | Admitting: Family Medicine

## 2012-07-08 ENCOUNTER — Ambulatory Visit (INDEPENDENT_AMBULATORY_CARE_PROVIDER_SITE_OTHER): Payer: Medicare Other | Admitting: Family Medicine

## 2012-07-08 VITALS — BP 148/104 | HR 108 | Resp 20

## 2012-07-08 DIAGNOSIS — I10 Essential (primary) hypertension: Secondary | ICD-10-CM

## 2012-07-08 DIAGNOSIS — J45909 Unspecified asthma, uncomplicated: Secondary | ICD-10-CM

## 2012-07-08 DIAGNOSIS — B977 Papillomavirus as the cause of diseases classified elsewhere: Secondary | ICD-10-CM

## 2012-07-08 DIAGNOSIS — A630 Anogenital (venereal) warts: Secondary | ICD-10-CM

## 2012-07-08 DIAGNOSIS — F411 Generalized anxiety disorder: Secondary | ICD-10-CM

## 2012-07-08 DIAGNOSIS — F419 Anxiety disorder, unspecified: Secondary | ICD-10-CM

## 2012-07-08 DIAGNOSIS — R8789 Other abnormal findings in specimens from female genital organs: Secondary | ICD-10-CM

## 2012-07-08 DIAGNOSIS — F329 Major depressive disorder, single episode, unspecified: Secondary | ICD-10-CM

## 2012-07-08 DIAGNOSIS — L299 Pruritus, unspecified: Secondary | ICD-10-CM | POA: Insufficient documentation

## 2012-07-08 MED ORDER — CLONIDINE HCL 0.3 MG PO TABS: 0.3000 mg | ORAL_TABLET | Freq: Two times a day (BID) | ORAL | Status: DC

## 2012-07-08 MED ORDER — IMIQUIMOD 5 % EX CREA: TOPICAL_CREAM | CUTANEOUS | Status: DC

## 2012-07-08 MED ORDER — HYDROXYZINE HCL 10 MG PO TABS: ORAL_TABLET | ORAL | Status: DC

## 2012-07-08 NOTE — Patient Instructions (Addendum)
Please identify , as previously discussed a new Primary care provider. You will be provided with names of a few local Doctors who may be taking new patients.  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

## 2012-07-10 ENCOUNTER — Ambulatory Visit: Payer: Medicare Other | Admitting: Family Medicine

## 2012-07-15 ENCOUNTER — Telehealth: Payer: Self-pay | Admitting: Family Medicine

## 2012-07-15 ENCOUNTER — Encounter: Payer: Self-pay | Admitting: Family Medicine

## 2012-07-15 DIAGNOSIS — A630 Anogenital (venereal) warts: Secondary | ICD-10-CM

## 2012-07-20 NOTE — Assessment & Plan Note (Signed)
Uncontrolled, toradol administered in office 

## 2012-07-20 NOTE — Assessment & Plan Note (Signed)
Urine sent for c/s , and treated empirically for uTI, will follow up on culture

## 2012-07-20 NOTE — Assessment & Plan Note (Signed)
Unchanged. Patient counseled for approximately 5 minutes regarding the health risks of ongoing nicotine use, specifically all types of cancer, heart disease, stroke and respiratory failure. The options available for help with cessation ,the behavioral changes to assist the process, and the option to either gradully reduce usage  Or abruptly stop.is also discussed. Pt is also encouraged to set specific goals in number of cigarettes used daily, as well as to set a quit date.  

## 2012-07-20 NOTE — Assessment & Plan Note (Signed)
Uncontrolled, despite attempts at medical management. referred to  Urology for management

## 2012-07-20 NOTE — Assessment & Plan Note (Signed)
Uncontrolled, dose increase on clonidine DASH diet and commitment to daily physical activity for a minimum of 30 minutes discussed and encouraged, as a part of hypertension management. The importance of attaining a healthy weight is also discussed.  

## 2012-07-20 NOTE — Assessment & Plan Note (Addendum)
Increased due to health ssues, including th fact that she is discharged for non compliance from this clinic Not suicidal or homicidal

## 2012-07-20 NOTE — Assessment & Plan Note (Signed)
Symptomatic with abnorml Ua, will treat empirically, and send urine for culture

## 2012-07-21 ENCOUNTER — Telehealth: Payer: Self-pay | Admitting: Family Medicine

## 2012-07-22 ENCOUNTER — Other Ambulatory Visit: Payer: Self-pay

## 2012-07-22 MED ORDER — ALPRAZOLAM 1 MG PO TABS: 1.0000 mg | ORAL_TABLET | Freq: Two times a day (BID) | ORAL | Status: DC

## 2012-07-22 MED ORDER — VERAPAMIL HCL ER 240 MG PO TBCR: 240.0000 mg | EXTENDED_RELEASE_TABLET | Freq: Every day | ORAL | Status: DC

## 2012-07-22 NOTE — Telephone Encounter (Signed)
Explain she will get 30 days only from this office, she has been discharged and is aware of this.Please fill 30 day only, even if she wants mail order What test results does she want? Please provide

## 2012-07-22 NOTE — Assessment & Plan Note (Signed)
Increased, due to multiple health conditions, espescialy disabling joint disease for which she needs increased assistance at home as well as chronic pain meds, which is also an issue

## 2012-07-22 NOTE — Progress Notes (Signed)
  Subjective:    Patient ID: [name], female    DOB: [birth year], 51 y.o.   MRN: [id number]  HPI Pt in with primary c/o "rash on her bottom" which she is unable to assess, and is concerning. Has been aware of this for weeks, appt with gyne still outstanding for f/u abnormal pap. Pt also c/o having no pain meds,she is generally prescribed pain meds from ortho Docs, she has 2 currently, and reports that it is a challenge to get scripts from either currently, also sees a neurologist  Blood pressure control is still an issue despite recent dose increase in medication   Review of Systems See HPI Denies recent fever or chills. Denies sinus pressure, nasal congestion, ear pain or sore throat. Denies chest congestion, productive cough or wheezing. Denies chest pains, palpitations and leg swelling Denies abdominal pain, nausea, vomiting,diarrhea or constipation.   Denies dysuria, frequency, hesitancy or incontinence. Chronic joint pain with limitation in mobility Denies headaches, seizures, numbness, or tingling. c/o depression and  anxiety not suicidal or homicidal         Objective:   Physical Exam Patient alert and oriented and in no cardiopulmonary distress.  HEENT: No facial asymmetry, EOMI, no sinus tenderness,  oropharynx pink and moist.  Neck adequate ROM no adenopathy.  Chest: Clear to auscultation bilaterally.  CVS: S1, S2 no murmurs, no S3.  ABD: Soft non tender. Bowel sounds normal.  Ext: No edema  MS: decreased  ROM spine and  Shoulders,adequate in hips and knees.  Skin: perianal warts.  Psych: Good eye contact, normal affect. Memory intact  anxious and mildly  depressed appearing.  CNS: CN 2-12 intact, power, tone and sensation normal throughout.        Assessment & Plan:

## 2012-07-22 NOTE — Assessment & Plan Note (Signed)
Med prescribed to reduce itch and therefore risk of skin breakdown with scratching

## 2012-07-22 NOTE — Telephone Encounter (Signed)
pls refill the aldara x 1. Please explain all creams for yeast infection are OTC and not covered

## 2012-07-22 NOTE — Assessment & Plan Note (Signed)
Topical med prescribed and referred to derm

## 2012-07-22 NOTE — Assessment & Plan Note (Signed)
Followed by psych, not suicidal or homicidal

## 2012-07-22 NOTE — Assessment & Plan Note (Signed)
Uncontrolled, dose increase in clonidine to twice daily  DASH diet and commitment to daily physical activity for a minimum of 30 minutes discussed and encouraged, as a part of hypertension management. The importance of attaining a healthy weight is also discussed.

## 2012-07-22 NOTE — Telephone Encounter (Signed)
This patients days are up. No more refills, correct?

## 2012-07-22 NOTE — Assessment & Plan Note (Signed)
Controlled, no change in medication  

## 2012-07-22 NOTE — Telephone Encounter (Signed)
Wants a refill on her wart med and she still has a yeast infection- the pills didn't help. Wants a cream sent in for the irritation. Told we would refill her regular meds for 30 days

## 2012-07-22 NOTE — Assessment & Plan Note (Signed)
Followed by gyne, needs to make f/u appt

## 2012-07-22 NOTE — Telephone Encounter (Signed)
She can use nystatin that is good

## 2012-07-23 ENCOUNTER — Encounter: Payer: Self-pay | Admitting: Infectious Diseases

## 2012-07-23 ENCOUNTER — Ambulatory Visit (INDEPENDENT_AMBULATORY_CARE_PROVIDER_SITE_OTHER): Payer: Medicare Other | Admitting: Infectious Diseases

## 2012-07-23 VITALS — BP 127/63 | HR 83 | Temp 97.6°F | Wt 180.0 lb

## 2012-07-23 DIAGNOSIS — Z21 Asymptomatic human immunodeficiency virus [HIV] infection status: Secondary | ICD-10-CM

## 2012-07-23 DIAGNOSIS — B373 Candidiasis of vulva and vagina: Secondary | ICD-10-CM

## 2012-07-23 DIAGNOSIS — B20 Human immunodeficiency virus [HIV] disease: Secondary | ICD-10-CM

## 2012-07-23 DIAGNOSIS — B977 Papillomavirus as the cause of diseases classified elsewhere: Secondary | ICD-10-CM

## 2012-07-23 DIAGNOSIS — A630 Anogenital (venereal) warts: Secondary | ICD-10-CM

## 2012-07-23 DIAGNOSIS — R8789 Other abnormal findings in specimens from female genital organs: Secondary | ICD-10-CM

## 2012-07-23 DIAGNOSIS — F319 Bipolar disorder, unspecified: Secondary | ICD-10-CM

## 2012-07-23 LAB — CBC
HCT: 38.6 % (ref 36.0–46.0)
Hemoglobin: 12.8 g/dL (ref 12.0–15.0)
MCH: 28.5 pg (ref 26.0–34.0)
MCHC: 33.2 g/dL (ref 30.0–36.0)
RDW: 14.1 % (ref 11.5–15.5)

## 2012-07-23 MED ORDER — NYSTATIN 100000 UNIT/GM EX CREA: TOPICAL_CREAM | Freq: Two times a day (BID) | CUTANEOUS | Status: DC

## 2012-07-23 MED ORDER — IMIQUIMOD 5 % EX CREA: TOPICAL_CREAM | CUTANEOUS | Status: DC

## 2012-07-23 NOTE — Assessment & Plan Note (Signed)
Will have her seen by surgery 

## 2012-07-23 NOTE — Assessment & Plan Note (Signed)
She needs to get back in with her psychiatrist. Her speech is pressured today as usual.

## 2012-07-23 NOTE — Assessment & Plan Note (Signed)
Will refill topical 

## 2012-07-23 NOTE — Telephone Encounter (Signed)
Med sent in.

## 2012-07-23 NOTE — Assessment & Plan Note (Signed)
Will send her to GYN ?

## 2012-07-23 NOTE — Assessment & Plan Note (Signed)
Will recheck her labs today. geno as well. She has multiple co-morbidities and needs help with filling her meds at home. Will try to get her bridge counselor? Refuses flu.  rtc 3 months

## 2012-07-23 NOTE — Progress Notes (Signed)
  Subjective:    Patient ID: [name], female    DOB: 31-May-1961, 51 y.o.   MRN: [id number]  HPI 51 yo F with long hx of HIV+, bipolar, abn pap, anal warts. Prev hospitalization in [facility] with suspicion of Pneumocystis. In hospital 07-02-11 for knee pain bilaterally, She was found to have pancreatitis and hepatitis. She was taken off her ART. 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 She has previously had knee    Prev Genotype M184V.    HIV 1 RNA Quant (copies/mL)  Date Value  03/13/2012 2075*  09/26/2011 21   11/15/2010 21600*     CD4 T Cell Abs (cmm)  Date Value  09/26/2011 390*  11/15/2010 810   08/02/2010 820     Has split from her PCP (Dr Lodema Hong). Has new appt with Dr Felecia Shelling Had R rotator cuff repair ~ 1 month ago. Is working on getting knee replacements, R worse than L.  Has been noted to have diastolic htn at her last visits. She attributes this to pain, cigarettes.  Has not seen MD for her anal wart. Has not had f/u for her abn PAP.  Needs get back to see her psychiatrist.  Has missed some ART due to pain in her shoulder after surgery. States she is taking ISN/DRVr/TRV  Review of Systems  Constitutional: Negative for appetite change and unexpected weight change.  Gastrointestinal: Negative for diarrhea and constipation.  Genitourinary: Positive for frequency. Negative for dysuria.       Objective:   Physical Exam  Constitutional: She appears well-developed and well-nourished.  HENT:  Mouth/Throat: No oropharyngeal exudate.  Eyes: EOM are normal. Pupils are equal, round, and reactive to light.  Neck: Neck supple.  Cardiovascular: Normal rate, regular rhythm and normal heart sounds.   Pulmonary/Chest: Effort normal and breath sounds normal.  Abdominal: Soft. Bowel sounds are normal. She exhibits no distension. There is no tenderness.  Musculoskeletal:       Arms: Lymphadenopathy:    She has no cervical adenopathy.          Assessment & Plan:

## 2012-07-24 LAB — COMPREHENSIVE METABOLIC PANEL
AST: 29 U/L (ref 0–37)
Alkaline Phosphatase: 110 U/L (ref 39–117)
BUN: 10 mg/dL (ref 6–23)
Glucose, Bld: 83 mg/dL (ref 70–99)
Potassium: 4.5 mEq/L (ref 3.5–5.3)
Total Bilirubin: 0.3 mg/dL (ref 0.3–1.2)

## 2012-07-24 LAB — HIV-1 RNA ULTRAQUANT REFLEX TO GENTYP+
HIV 1 RNA Quant: <20 copies/mL (ref <20)
HIV-1 RNA Quant, Log: <1.3 {Log} (ref <1.30)

## 2012-08-01 ENCOUNTER — Encounter (HOSPITAL_COMMUNITY): Payer: Self-pay | Admitting: Emergency Medicine

## 2012-08-01 ENCOUNTER — Observation Stay (HOSPITAL_COMMUNITY)
Admission: EM | Admit: 2012-08-01 | Discharge: 2012-08-02 | Disposition: A | Discharge status: Home / Self Care | Payer: Medicare Other | Attending: Emergency Medicine | Admitting: Emergency Medicine

## 2012-08-01 DIAGNOSIS — F319 Bipolar disorder, unspecified: Secondary | ICD-10-CM | POA: Insufficient documentation

## 2012-08-01 DIAGNOSIS — I10 Essential (primary) hypertension: Secondary | ICD-10-CM | POA: Insufficient documentation

## 2012-08-01 DIAGNOSIS — J438 Other emphysema: Secondary | ICD-10-CM | POA: Insufficient documentation

## 2012-08-01 DIAGNOSIS — B3731 Acute candidiasis of vulva and vagina: Secondary | ICD-10-CM

## 2012-08-01 DIAGNOSIS — Y849 Medical procedure, unspecified as the cause of abnormal reaction of the patient, or of later complication, without mention of misadventure at the time of the procedure: Secondary | ICD-10-CM

## 2012-08-01 DIAGNOSIS — M549 Dorsalgia, unspecified: Secondary | ICD-10-CM | POA: Insufficient documentation

## 2012-08-01 DIAGNOSIS — Z9181 History of falling: Secondary | ICD-10-CM | POA: Insufficient documentation

## 2012-08-01 DIAGNOSIS — I951 Orthostatic hypotension: Secondary | ICD-10-CM | POA: Insufficient documentation

## 2012-08-01 DIAGNOSIS — Z21 Asymptomatic human immunodeficiency virus [HIV] infection status: Secondary | ICD-10-CM | POA: Insufficient documentation

## 2012-08-01 DIAGNOSIS — IMO0002 Reserved for concepts with insufficient information to code with codable children: Secondary | ICD-10-CM | POA: Insufficient documentation

## 2012-08-01 DIAGNOSIS — L299 Pruritus, unspecified: Secondary | ICD-10-CM

## 2012-08-01 DIAGNOSIS — R32 Unspecified urinary incontinence: Secondary | ICD-10-CM | POA: Insufficient documentation

## 2012-08-01 DIAGNOSIS — G8929 Other chronic pain: Secondary | ICD-10-CM | POA: Insufficient documentation

## 2012-08-01 DIAGNOSIS — R339 Retention of urine, unspecified: Principal | ICD-10-CM

## 2012-08-01 DIAGNOSIS — R209 Unspecified disturbances of skin sensation: Secondary | ICD-10-CM | POA: Insufficient documentation

## 2012-08-01 DIAGNOSIS — K219 Gastro-esophageal reflux disease without esophagitis: Secondary | ICD-10-CM | POA: Insufficient documentation

## 2012-08-01 DIAGNOSIS — B373 Candidiasis of vulva and vagina: Secondary | ICD-10-CM

## 2012-08-01 DIAGNOSIS — Z79899 Other long term (current) drug therapy: Secondary | ICD-10-CM | POA: Insufficient documentation

## 2012-08-01 LAB — URINALYSIS, ROUTINE W REFLEX MICROSCOPIC
Glucose, UA: NEGATIVE mg/dL
Hgb urine dipstick: NEGATIVE

## 2012-08-01 LAB — URINE MICROSCOPIC-ADD ON

## 2012-08-01 MED ORDER — PROMETHAZINE HCL 12.5 MG PO TABS
25.0000 mg | ORAL_TABLET | Freq: Once | ORAL | Status: AC
  Administered 2012-08-01: 25 mg via ORAL
  Filled 2012-08-01: qty 2

## 2012-08-01 MED ORDER — OXYCODONE-ACETAMINOPHEN 5-325 MG PO TABS
1.0000 | ORAL_TABLET | Freq: Once | ORAL | Status: AC
  Administered 2012-08-01: 1 via ORAL
  Filled 2012-08-01: qty 1

## 2012-08-01 MED ORDER — SODIUM CHLORIDE 0.9 % IV SOLN
Freq: Once | INTRAVENOUS | Status: AC
  Administered 2012-08-01: 19:00:00 via INTRAVENOUS

## 2012-08-01 NOTE — ED Notes (Signed)
Pt was seen today at Dr. Ronal Fear office for chronic back pain and received a shot in her back and then was explained to the patient that she may suffer from numbness of bilateral legs and pt has lost control of her bowels. Per ems Dr. Gerilyn Pilgrim sent her over for hypotension.

## 2012-08-01 NOTE — ED Provider Notes (Signed)
History    This chart was scribed for Veronica Human, MD, MD by Smitty Pluck. The patient was seen in room APA11 and the patient's care was started at 3:20PM.   CSN: [id number]  Arrival date & time 08/01/12  1336   None     Chief Complaint  Patient presents with  . Numbness    (Consider location/radiation/quality/duration/timing/severity/associated sxs/prior treatment) The history is provided by the patient. No language interpreter was used.   [name] is a 51 y.o. female with hx of HIV, chronic back pain and HTN who presents to the Emergency Department complaining of constant, moderate numbness in bilateral legs and incontinence onset today. Pt reports having injection at Dr. Ronal Fear office for chronic back pain which caused numbness. She reports that she was not able to walk after doctors visit. She reports falling on the ground. She reports that this was not the first injection and she has never had similar symptoms in the past. Pt denies any other pain. Pt reports that her back pain is 7/10 currently.   PCP is Dr. Lodema Hong   Past Medical History  Diagnosis Date  . Baker's cyst   . Pulmonary lesion 1/03    on RUL onn CT   . Hemorrhoids   . Zoster 2001  . Family history of diabetes mellitus   . Tobacco user   . Other abnormal Papanicolaou smear of cervix and cervical HPV(795.09)   . Herpes zoster   . Hemorrhoids   . Baker's cyst   . Pancreatitis   . HPV ([name] papilloma virus) infection   . HIV disease     take Prezista,Truvada,Norvir,and Isentress daily  . Bipolar disorder     With hx of psychotic features.  . Articular cartilage disorder of knee   . Bursitis of shoulder   . Rotator cuff arthropathy   . Hypertension     takes Clonidine and Verapamil daily  . Emphysema   . Asthma     Albuterol resue inhaler and QVAR daily  . Shortness of breath     with exertion and stress  . Pneumonia     hx of;last time 1 1/19yrs ago  . Headache   . Dizziness     . Arthritis   . Joint pain   . Joint swelling   . Chronic back pain     buldging disc  . Bruises easily   . Gastric ulcer   . GERD (gastroesophageal reflux disease)     takes Nexium daily  . Chronic constipation   . Urinary frequency   . Urinary incontinence   . Nocturia   . History of blood transfusion at age 25  . Early cataracts, bilateral   . Anxiety   . Depression     takes Xanax daily and Depakote bid  . History of shingles   . 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 Right breast lumpectomy   . Total abdominal hysterectomy w/ bilateral salpingoophorectomy at age 11  . Breast surgery as a teenager    rt breast biopsy  . Cholecystectomy 10+yrs ago  . Knee arthroscopy     bilateral  . Appendectomy     with hysterectomy  . Finger surgery     rt middle finger with pin in it  . Epidural injections   . Esophagogastroduodenoscopy   . Shoulder hemi-arthroplasty 05/06/2012    Procedure: SHOULDER HEMI-ARTHROPLASTY;  Surgeon: Eulas Post,  MD;  Location: [facility] OR;  Service: Orthopedics;  Laterality: Right;    Family History  Problem Relation Age of Onset  . Diabetes      Family History  . Hypertension      Family history   . Hypertension Mother   . Diabetes Mother   . Diabetes Father   . Cancer Sister 5    x2 (liver)  . Diabetes Brother     History  Substance Use Topics  . Smoking status: Current Every Day Smoker -- 1.0 packs/day for 20 years    Types: Cigarettes  . Smokeless tobacco: Never Used  . Alcohol Use: 0.5 oz/week    1 drink(s) per week     brandy    OB History    Grav Para Term Preterm Abortions TAB SAB Ect Mult Living                  Review of Systems  Gastrointestinal: Positive for nausea.  Neurological: Positive for numbness.  All other systems reviewed and are negative.    Allergies  Aspirin and Sulfonamide derivatives  Home Medications    Current Outpatient Rx  Name Route Sig Dispense Refill  . ALBUTEROL SULFATE HFA 108 (90 BASE) MCG/ACT IN AERS Inhalation Inhale 2 puffs into the lungs every 4 (four) hours as needed. 2 puffs every 4-6 hours as needed for shortness of breath    . ALPRAZOLAM 1 MG PO TABS Oral Take 1 tablet (1 mg total) by mouth 2 (two) times daily. Take 1 tablet by mouth two times a day as needed. For anxiety 60 tablet 0  . BECLOMETHASONE DIPROPIONATE 40 MCG/ACT IN AERS Inhalation Inhale 2 puffs into the lungs 2 (two) times daily. 2 puffs two times a day 1 Inhaler 3  . CLONIDINE HCL 0.3 MG PO TABS Oral Take 1 tablet (0.3 mg total) by mouth 2 (two) times daily. 60 tablet 11    Clonidine to be taken 12 hours apart every day, at ...  . VITAMIN B-12 IJ Injection Inject as directed every 30 (thirty) days.    Marland Kitchen DARUNAVIR ETHANOLATE 400 MG PO TABS Oral Take 800 mg by mouth daily with breakfast.    . DIAZEPAM 5 MG PO TABS Oral Take 5 mg by mouth every 6 (six) hours as needed. For muscle spasms/anxiety    . DICLOFENAC SODIUM 75 MG PO TBEC Oral Take 75 mg by mouth 2 (two) times daily.    Marland Kitchen DIVALPROEX SODIUM 250 MG PO TBEC  1 in AM and 1 at HS    . EMTRICITABINE-TENOFOVIR 200-300 MG PO TABS Oral Take 1 tablet by mouth daily.    Marland Kitchen ESOMEPRAZOLE MAGNESIUM 40 MG PO CPDR Oral Take 1 capsule (40 mg total) by mouth daily before breakfast. Take one capsule by mouth once a day 90 capsule 1  . FLUCONAZOLE 150 MG PO TABS  One tablet once daily, as needed, for vaginal itch 3 tablet 0  . FOLIC ACID 1 MG PO TABS Oral Take 1 mg by mouth daily.      Marland Kitchen HYDROXYZINE HCL 10 MG PO TABS  One tablet at bedtime , as needed, for itching 30 tablet 0  . IMIQUIMOD 5 % EX CREA  Apply to affected area at bedtime, and wash off after 6 to 10 hours 24 each 0  . METHOCARBAMOL 750 MG PO TABS Oral Take 1 tablet (750 mg total) by mouth 4 (four) times daily as needed. For muscle spasms 75 tablet 1  .  NYSTATIN 100000 UNIT/GM EX CREA Topical Apply topically 2  (two) times daily. 30 g 0  . OXYCODONE-ACETAMINOPHEN 7.5-325 MG PO TABS Oral Take 1 tablet by mouth every 6 (six) hours as needed.    Marland Kitchen RALTEGRAVIR POTASSIUM 400 MG PO TABS Oral Take 400 mg by mouth 2 (two) times daily.    Marland Kitchen RITONAVIR 100 MG PO CAPS Oral Take 1 capsule (100 mg total) by mouth daily. 100 each 3  . TRAMADOL HCL 50 MG PO TABS Oral Take 100 mg by mouth every 6 (six) hours as needed.    Marland Kitchen VERAPAMIL HCL ER 240 MG PO TBCR Oral Take 1 tablet (240 mg total) by mouth at bedtime. 30 tablet 0  . VITAMIN D (ERGOCALCIFEROL) 50000 UNITS PO CAPS Oral Take 50,000 Units by mouth. Take on mondays      BP 125/92  Pulse 95  Temp 97.9 F (36.6 C) (Oral)  Resp 20  SpO2 96%  Physical Exam  Nursing note and vitals reviewed. Constitutional: She is oriented to person, place, and time. She appears well-developed and well-nourished. No distress.  HENT:  Head: Normocephalic and atraumatic.  Eyes: Conjunctivae normal are normal.  Neck: Normal range of motion. Neck supple.  Cardiovascular: Normal rate, regular rhythm and normal heart sounds.   Pulmonary/Chest: Effort normal. No respiratory distress.  Abdominal: Soft. She exhibits no distension. There is no tenderness. There is no rebound.  Neurological: She is alert and oriented to person, place, and time.  Skin: Skin is warm and dry.  Psychiatric: She has a normal mood and affect. Her behavior is normal.    ED Course  Procedures (including critical care time) DIAGNOSTIC STUDIES: Oxygen Saturation is 96% on room air, normal by my interpretation.    COORDINATION OF CARE: 3:35 PM Discussed ED treatment with pt   5:13 PM Pt feeling better. Will get orthostatic vital signs,see if pt can void.   6:05 PM Pt has significant orthostatic hypotension.  Will give 2 liters IV fluids over 2 hours.     7:18 PM Pt's case discussed with Dr. Gerilyn Pilgrim, who thinks that pt will improve as the medication wears off.   It is noteworthy that pt has not  urinated.  Will request RN to cath for residual urine.   9:49 PM Residual urine was about 125 ml.  She is on her second liter of IV normal saline now.  Her repeat orthostatic vital signs are somewhat improved, but she is still orthostatic.  She has been unable to void spontaneously.  Will call to [facility] to observe her for orthostasis, inability to void post epidural injection.  At Dr. Chancy Milroy suggestion, recontacted Dr. Gerilyn Pilgrim, who feels that pt will need imaging to check for cauda equina syndrome.    10:41 PM Case discussed with Dr. Ethelda Chick, ED attending at [facility], who accepts pt in transfer to get MRI of lumbar spine to check for cauda equina syndrome.  1. Urinary retention   2. Unspecified procedure as the cause of abnormal reaction of patient, or of later complication, without mention of misadventure at time of procedure           Carleene Cooper III, MD 08/01/12 2247

## 2012-08-01 NOTE — ED Notes (Signed)
Patient states she is unable to urinate. Advised MD. Order for in & out cath to be done by PCP.

## 2012-08-01 NOTE — ED Notes (Signed)
Pt given room temperature ginger ale and peanut butter crackers.

## 2012-08-01 NOTE — ED Notes (Signed)
Placed patient on bedside commode.

## 2012-08-02 ENCOUNTER — Observation Stay (HOSPITAL_COMMUNITY): Payer: Medicare Other

## 2012-08-02 ENCOUNTER — Encounter (HOSPITAL_COMMUNITY): Payer: Self-pay | Admitting: Emergency Medicine

## 2012-08-02 LAB — POCT I-STAT, CHEM 8
BUN: 6 mg/dL (ref 6–23)
Chloride: 109 mEq/L (ref 96–112)
HCT: 41 % (ref 36.0–46.0)
Potassium: 3.6 mEq/L (ref 3.5–5.1)
Sodium: 142 mEq/L (ref 135–145)

## 2012-08-02 LAB — CBC
Platelets: 188 10*3/uL (ref 150–400)
RBC: 4.56 MIL/uL (ref 3.87–5.11)
WBC: 3.7 10*3/uL — ABNORMAL LOW (ref 4.0–10.5)

## 2012-08-02 MED ORDER — PANTOPRAZOLE SODIUM 40 MG PO TBEC
40.0000 mg | DELAYED_RELEASE_TABLET | Freq: Every day | ORAL | Status: DC
  Administered 2012-08-02: 40 mg via ORAL
  Filled 2012-08-02: qty 1

## 2012-08-02 MED ORDER — METHOCARBAMOL 500 MG PO TABS: 750.0000 mg | ORAL_TABLET | Freq: Four times a day (QID) | ORAL | Status: DC | PRN

## 2012-08-02 MED ORDER — KETOROLAC TROMETHAMINE 30 MG/ML IJ SOLN: 30.0000 mg | Freq: Four times a day (QID) | INTRAMUSCULAR | Status: DC | PRN

## 2012-08-02 MED ORDER — RALTEGRAVIR POTASSIUM 400 MG PO TABS
400.0000 mg | ORAL_TABLET | Freq: Every day | ORAL | Status: DC
  Administered 2012-08-02: 400 mg via ORAL
  Filled 2012-08-02 (×2): qty 1

## 2012-08-02 MED ORDER — EMTRICITABINE-TENOFOVIR DF 200-300 MG PO TABS
1.0000 | ORAL_TABLET | Freq: Every day | ORAL | Status: DC
  Administered 2012-08-02: 1 via ORAL
  Filled 2012-08-02 (×2): qty 1

## 2012-08-02 MED ORDER — OXYCODONE-ACETAMINOPHEN 5-325 MG PO TABS
1.0000 | ORAL_TABLET | Freq: Four times a day (QID) | ORAL | Status: DC | PRN
  Administered 2012-08-02: 2 via ORAL
  Filled 2012-08-02: qty 2

## 2012-08-02 MED ORDER — FLUTICASONE PROPIONATE HFA 44 MCG/ACT IN AERO
1.0000 | INHALATION_SPRAY | Freq: Two times a day (BID) | RESPIRATORY_TRACT | Status: DC
  Administered 2012-08-02: 1 via RESPIRATORY_TRACT
  Filled 2012-08-02: qty 10.6

## 2012-08-02 MED ORDER — ALBUTEROL SULFATE HFA 108 (90 BASE) MCG/ACT IN AERS
2.0000 | INHALATION_SPRAY | RESPIRATORY_TRACT | Status: DC | PRN
  Administered 2012-08-02: 2 via RESPIRATORY_TRACT
  Filled 2012-08-02: qty 6.7

## 2012-08-02 MED ORDER — ONDANSETRON HCL 4 MG/2ML IJ SOLN: 4.0000 mg | Freq: Four times a day (QID) | INTRAMUSCULAR | Status: DC | PRN

## 2012-08-02 MED ORDER — TAMSULOSIN HCL 0.4 MG PO CAPS: 0.4000 mg | ORAL_CAPSULE | Freq: Two times a day (BID) | ORAL | Status: DC

## 2012-08-02 MED ORDER — LORAZEPAM 2 MG/ML IJ SOLN
1.0000 mg | Freq: Once | INTRAMUSCULAR | Status: AC
Start: 2012-08-02 — End: 2012-08-02
  Administered 2012-08-02: 1 mg via INTRAVENOUS
  Filled 2012-08-02: qty 1

## 2012-08-02 MED ORDER — ACETAMINOPHEN 325 MG PO TABS
650.0000 mg | ORAL_TABLET | ORAL | Status: DC | PRN
  Administered 2012-08-02: 650 mg via ORAL
  Filled 2012-08-02: qty 2

## 2012-08-02 MED ORDER — CLONIDINE HCL 0.1 MG PO TABS: 0.1000 mg | ORAL_TABLET | Freq: Every day | ORAL | Status: DC

## 2012-08-02 MED ORDER — HYDROXYZINE HCL 25 MG PO TABS: 25.0000 mg | ORAL_TABLET | Freq: Four times a day (QID) | ORAL | Status: DC | PRN

## 2012-08-02 MED ORDER — VERAPAMIL HCL ER 240 MG PO TBCR
240.0000 mg | EXTENDED_RELEASE_TABLET | Freq: Every day | ORAL | Status: DC
  Administered 2012-08-02: 240 mg via ORAL
  Filled 2012-08-02 (×2): qty 1

## 2012-08-02 MED ORDER — DIVALPROEX SODIUM 250 MG PO DR TAB
250.0000 mg | DELAYED_RELEASE_TABLET | Freq: Two times a day (BID) | ORAL | Status: DC
  Administered 2012-08-02: 500 mg via ORAL
  Filled 2012-08-02 (×2): qty 2

## 2012-08-02 MED ORDER — DIAZEPAM 5 MG PO TABS: 5.0000 mg | ORAL_TABLET | Freq: Four times a day (QID) | ORAL | Status: DC | PRN

## 2012-08-02 MED ORDER — RITONAVIR 100 MG PO CAPS
100.0000 mg | ORAL_CAPSULE | Freq: Every day | ORAL | Status: DC
  Administered 2012-08-02: 100 mg via ORAL
  Filled 2012-08-02: qty 1

## 2012-08-02 MED ORDER — DIPHENOXYLATE-ATROPINE 2.5-0.025 MG PO TABS
1.0000 | ORAL_TABLET | Freq: Four times a day (QID) | ORAL | Status: DC | PRN
  Administered 2012-08-02: 1 via ORAL
  Filled 2012-08-02: qty 1

## 2012-08-02 MED ORDER — FLUTICASONE PROPIONATE 50 MCG/ACT NA SUSP
1.0000 | Freq: Every day | NASAL | Status: DC
  Administered 2012-08-02: 1 via NASAL
  Filled 2012-08-02: qty 16

## 2012-08-02 MED ORDER — RITONAVIR 100 MG PO CAPS
100.0000 mg | ORAL_CAPSULE | Freq: Two times a day (BID) | ORAL | Status: DC
  Filled 2012-08-02 (×3): qty 1

## 2012-08-02 MED ORDER — HYDROMORPHONE HCL PF 1 MG/ML IJ SOLN: 1.0000 mg | Freq: Four times a day (QID) | INTRAMUSCULAR | Status: DC | PRN

## 2012-08-02 MED ORDER — DARUNAVIR ETHANOLATE 400 MG PO TABS
400.0000 mg | ORAL_TABLET | Freq: Every day | ORAL | Status: DC
  Administered 2012-08-02: 400 mg via ORAL
  Filled 2012-08-02 (×2): qty 1

## 2012-08-02 NOTE — ED Notes (Signed)
Patient was transferred from [facility] for an MRI.  Dr. Ebbie Latus does epidurals for her back pain.  Today he did an epidural and something didn't feel right.   She said she went numb from the waist.  Now, she feels pain in her knees and thighs.  Now it is just the right buttocks that feels slightly numb.

## 2012-08-02 NOTE — ED Provider Notes (Signed)
Patient back pain and numbness resolved. Complaining of knee pain. Percocet given per PRN orders with improvement of knee pain. Hypertensive, at home dose of verapamil given with improvement. Patient still not voiding well.   MRI remarkable for Mild lumbar spondylosis and facet arthrosis, most pronounced at L5-S1. Broad-based L5-S1 protrusion appears slightly moreprominent than on the prior exam, potentially affecting both descending S1 nerves. No neural compression.  Neurological assessment: Cranial nerves II-XII grossly intact. Sensation intact. Strength in right leg greater than left. DTRs intact. Questionable impaired rectal tone. Patient has significant anal condyloma and could not valsalva without pain. Neurosurgery consulted.  Spoke with Neurosurgery who does not have any recommendations or need for intervention. Physician stated that I should consult Dr. Gerilyn Pilgrim for further recommendations.  Spoke with Dr. Gerilyn Pilgrim who recommended urology consult. Will consult urology.  Spoke with Dr. Briant Cedar who recommended leg bag, Flomax, and follow up on Monday with his office. Patient discharged with return precautions. No red flags for cauda equina.      MR Lumbar Spine Wo Contrast (Final result)   Result time:08/02/12 0911    Final result by Rad Results In Interface (08/02/12 09:11:18)    Narrative:   *RADIOLOGY REPORT*  Clinical Data: Left leg pain. Numbness. Loss of bladder sensation.  MRI LUMBAR SPINE WITHOUT CONTRAST  Technique: Multiplanar and multiecho pulse sequences of the lumbar spine were obtained without intravenous contrast.  Comparison: 11/22/2010.  Findings: Numbering convention used on prior exam preserved. There is heterogenous marrow with loss of normal fatty marrow signal, which is a nonspecific finding. It is commonly associated with anemia, chronic disease, obesity, cigarette smoking. Spinal cord terminates posterior to the L1 vertebra. Paraspinal soft  tissues are within normal limits. Epidural lipomatosis is present in the lower lumbar spine. Degenerative endplate changes are present at L5-S1. Lower thoracic and upper lumbar levels to the L3-L4 are within normal limits. There are no disc herniations or stenosis. Facet joints at these levels appear normal.  L4-L5: Mild disc desiccation. Bilateral mild facet arthritis with small bilateral effusions. Central canal and lateral recesses are adequately patent. Foramina patent.  L5-S1: Bilateral facet hypertrophy. The central canal is patent. The disc is desiccated. There is a shallow broad-based central protrusion. This protrusion just contacts the descending S1 nerve roots bilaterally approaching the lateral recesses. Mild left foraminal stenosis is present, potentially affecting the exiting left L5 nerve.  The epidural space appears similar to the prior exam 11/22/2010. No epidural fluid collections are identified. Partial visualization of bladder distention.  IMPRESSION:  1. Mild lumbar spondylosis and facet arthrosis, most pronounced at L5-S1. Broad-based L5-S1 protrusion appears slightly more prominent than on the prior exam, potentially affecting both descending S1 nerves. No neural compression. 2. No epidural fluid collection or epidural process identified in this patient status post recent instrumentation.   Original Report Authenticated By: Andreas Newport, M.D.          Pixie Casino, PA-C 08/02/12 1307

## 2012-08-02 NOTE — ED Notes (Signed)
Pt very restless in bed and BP unable to monitor as pt pulls her BP cuff off.

## 2012-08-02 NOTE — ED Provider Notes (Signed)
History     CSN: [id number]  Arrival date & time 08/01/12  1336   First MD Initiated Contact with Patient 08/01/12 1527      Chief Complaint  Patient presents with  . Numbness    (Consider location/radiation/quality/duration/timing/severity/associated sxs/prior treatment) HPI Hx per PT, having LBP despite her home narcotics, getting injections from NEU. Seen at [facility] tonight and sent here for an MRI of her lower back, no incontinence or bowel or bladder. No weakness in her LEs, no saddle paraesthesias. R knee numbness with h/o same, no new symptoms just pain despite home meds. Severe pain, sharp in quality, no radiation, better with sitting down, worse with movement.  Past Medical History  Diagnosis Date  . Baker's cyst   . Pulmonary lesion 1/03    on RUL onn CT   . Hemorrhoids   . Zoster 2001  . Family history of diabetes mellitus   . Tobacco user   . Other abnormal Papanicolaou smear of cervix and cervical HPV(795.09)   . Herpes zoster   . Hemorrhoids   . Baker's cyst   . Pancreatitis   . HPV (human papilloma virus) infection   . HIV disease     take Prezista,Truvada,Norvir,and Isentress daily  . Bipolar disorder     With hx of psychotic features.  . Articular cartilage disorder of knee   . Bursitis of shoulder   . Rotator cuff arthropathy   . Hypertension     takes Clonidine and Verapamil daily  . Emphysema   . Asthma     Albuterol resue inhaler and QVAR daily  . Shortness of breath     with exertion and stress  . Pneumonia     hx of;last time 1 1/30yrs ago  . Headache   . Dizziness   . Arthritis   . Joint pain   . Joint swelling   . Chronic back pain     buldging disc  . Bruises easily   . Gastric ulcer   . GERD (gastroesophageal reflux disease)     takes Nexium daily  . Chronic constipation   . Urinary frequency   . Urinary incontinence   . Nocturia   . History of blood transfusion at age 44  . Early cataracts, bilateral   . Anxiety   . Depression      takes Xanax daily and Depakote bid  . History of shingles   . 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 Right breast lumpectomy   . Total abdominal hysterectomy w/ bilateral salpingoophorectomy at age 62  . Breast surgery as a teenager    rt breast biopsy  . Cholecystectomy 10+yrs ago  . Knee arthroscopy     bilateral  . Appendectomy     with hysterectomy  . Finger surgery     rt middle finger with pin in it  . Epidural injections   . Esophagogastroduodenoscopy   . Shoulder hemi-arthroplasty 05/06/2012    Procedure: SHOULDER HEMI-ARTHROPLASTY;  Surgeon: Eulas Post, MD;  Location: [facility] OR;  Service: Orthopedics;  Laterality: Right;    Family History  Problem Relation Age of Onset  . Diabetes      Family History  . Hypertension      Family history   . Hypertension Mother   . Diabetes Mother   . Diabetes Father   . Cancer Sister 95    x2 (liver)  . Diabetes Brother  History  Substance Use Topics  . Smoking status: Current Every Day Smoker -- 1.0 packs/day for 20 years    Types: Cigarettes  . Smokeless tobacco: Never Used  . Alcohol Use: 0.5 oz/week    1 drink(s) per week     brandy    OB History    Grav Para Term Preterm Abortions TAB SAB Ect Mult Living                  Review of Systems  Constitutional: Negative for fever and chills.  HENT: Negative for neck pain and neck stiffness.   Eyes: Negative for pain.  Respiratory: Negative for shortness of breath.   Cardiovascular: Negative for chest pain.  Gastrointestinal: Negative for abdominal pain.  Genitourinary: Negative for dysuria.  Musculoskeletal: Positive for back pain.  Skin: Negative for rash.  Neurological: Positive for numbness. Negative for headaches.  All other systems reviewed and are negative.    Allergies  Aspirin and Sulfonamide derivatives  Home Medications   Current Outpatient Rx  Name Route  Sig Dispense Refill  . ALBUTEROL SULFATE HFA 108 (90 BASE) MCG/ACT IN AERS Inhalation Inhale 2 puffs into the lungs every 4 (four) hours as needed. 2 puffs every 4-6 hours as needed for shortness of breath    . ALPRAZOLAM 1 MG PO TABS Oral Take 1 tablet (1 mg total) by mouth 2 (two) times daily. Take 1 tablet by mouth two times a day as needed. For anxiety 60 tablet 0  . BECLOMETHASONE DIPROPIONATE 40 MCG/ACT IN AERS Inhalation Inhale 2 puffs into the lungs 2 (two) times daily. 2 puffs two times a day 1 Inhaler 3  . CLONIDINE HCL 0.2 MG PO TABS Oral Take 0.1 mg by mouth at bedtime.    Marland Kitchen VITAMIN B-12 IJ Injection Inject as directed every 30 (thirty) days.    Marland Kitchen DARUNAVIR ETHANOLATE 400 MG PO TABS Oral Take 400 mg by mouth daily.     Marland Kitchen DIAZEPAM 5 MG PO TABS Oral Take 5 mg by mouth every 6 (six) hours as needed. For muscle spasms/anxiety    . DICLOFENAC SODIUM 75 MG PO TBEC Oral Take 75 mg by mouth 2 (two) times daily.    Marland Kitchen DIPHENOXYLATE-ATROPINE 2.5-0.025 MG PO TABS Oral Take 1 tablet by mouth Three times daily as needed. For cramping and diarrhea    . DIVALPROEX SODIUM 250 MG PO TBEC Oral Take 250-500 mg by mouth 2 (two) times daily. 1 in AM and 1 at HS    . EMTRICITABINE-TENOFOVIR 200-300 MG PO TABS Oral Take 1 tablet by mouth daily.    Marland Kitchen ESOMEPRAZOLE MAGNESIUM 40 MG PO CPDR Oral Take 1 capsule (40 mg total) by mouth daily before breakfast. Take one capsule by mouth once a day 90 capsule 1  . HYDROCODONE-ACETAMINOPHEN 7.5-325 MG PO TABS Oral Take 1 tablet by mouth Every 4 hours as needed. *take one tablet every 4 to 6 hours as needed for pain*    . IMIQUIMOD 5 % EX CREA  Apply to affected area at bedtime, and wash off after 6 to 10 hours 24 each 0  . METHOCARBAMOL 750 MG PO TABS Oral Take 1 tablet (750 mg total) by mouth 4 (four) times daily as needed. For muscle spasms 75 tablet 1  . NASONEX 50 MCG/ACT NA SUSP Nasal Place 1 spray into the nose Once daily as needed. For allergies and congestion    .  NYSTATIN 100000 UNIT/GM EX CREA Topical Apply topically 2 (two)  times daily. 30 g 0  . OXYCODONE-ACETAMINOPHEN 7.5-325 MG PO TABS Oral Take 1 tablet by mouth every 6 (six) hours as needed. For pain    . RALTEGRAVIR POTASSIUM 400 MG PO TABS Oral Take 400 mg by mouth daily.     Marland Kitchen RITONAVIR 100 MG PO CAPS Oral Take 100 mg by mouth 2 (two) times daily.    Marland Kitchen VERAPAMIL HCL ER 240 MG PO TBCR Oral Take 240 mg by mouth daily.    Marland Kitchen VITAMIN D (ERGOCALCIFEROL) 50000 UNITS PO CAPS Oral Take 50,000 Units by mouth every 7 (seven) days. Take on mondays    . HYDROXYZINE HCL 10 MG PO TABS  One tablet at bedtime , as needed, for itching 30 tablet 0  . TRAMADOL HCL 50 MG PO TABS Oral Take 100 mg by mouth every 6 (six) hours as needed.      BP 182/112  Pulse 88  Temp 97.9 F (36.6 C) (Oral)  Resp 23  SpO2 100%  Physical Exam  Constitutional: She is oriented to person, place, and time. She appears well-developed and well-nourished.  HENT:  Head: Normocephalic and atraumatic.  Eyes: Conjunctivae normal and EOM are normal. Pupils are equal, round, and reactive to light.  Neck: Full passive range of motion without pain. Neck supple. No thyromegaly present.       No cervical spine tenderness  Cardiovascular: Normal rate, regular rhythm, S1 normal, S2 normal and intact distal pulses.   Pulmonary/Chest: Effort normal and breath sounds normal.  Abdominal: Soft. Bowel sounds are normal. There is no tenderness. There is no CVA tenderness.  Musculoskeletal: Normal range of motion.  Neurological: She is alert and oriented to person, place, and time. She has normal strength and normal reflexes. No cranial nerve deficit or sensory deficit. She displays a negative Romberg sign. GCS eye subscore is 4. GCS verbal subscore is 5. GCS motor subscore is 6.       Tender over upper to mid lumbar spine. No focal deficits, equal LE strengths and DTrs with sensorium to light touch intact throughout  Skin: Skin is warm and dry. No  rash noted. No cyanosis. Nails show no clubbing.  Psychiatric: She has a normal mood and affect. Her speech is normal and behavior is normal.    ED Course  Procedures (including critical care time)  Results for orders placed during the hospital encounter of 08/01/12  URINALYSIS, ROUTINE W REFLEX MICROSCOPIC      Component Value Range   Color, Urine YELLOW  YELLOW   APPearance HAZY (*) CLEAR   Specific Gravity, Urine 1.020  1.005 - 1.030   pH 6.0  5.0 - 8.0   Glucose, UA NEGATIVE  NEGATIVE mg/dL   Hgb urine dipstick NEGATIVE  NEGATIVE   Bilirubin Urine MODERATE (*) NEGATIVE   Ketones, ur TRACE (*) NEGATIVE mg/dL   Protein, ur TRACE (*) NEGATIVE mg/dL   Urobilinogen, UA 1.0  0.0 - 1.0 mg/dL   Nitrite NEGATIVE  NEGATIVE   Leukocytes, UA NEGATIVE  NEGATIVE  URINE MICROSCOPIC-ADD ON      Component Value Range   Squamous Epithelial / LPF RARE  RARE   WBC, UA 0-2  <3 WBC/hpf   RBC / HPF 0-2  <3 RBC/hpf   Bacteria, UA FEW (*) RARE     IV narcoitics and plan CDU back pain protocol for MRI. Discussed with radiologist - plan MRI.   MDM   Records from [facility] reviewed. possible urinary retention with UOP with  foley, unable to urinate after that despite IVFs.         Sunnie Nielsen, MD 08/02/12 [phone]

## 2012-08-02 NOTE — ED Notes (Addendum)
Patient was transported to [facility] ED by [facility].  Patient has a PIV#22 in left forearm.  BP=146/96, HR=88, Resp=20, 100% O2% on Room air.  Report received from [facility].

## 2012-08-05 ENCOUNTER — Ambulatory Visit (INDEPENDENT_AMBULATORY_CARE_PROVIDER_SITE_OTHER): Payer: Medicare Other | Admitting: Urology

## 2012-08-05 DIAGNOSIS — R339 Retention of urine, unspecified: Secondary | ICD-10-CM

## 2012-08-05 NOTE — ED Provider Notes (Signed)
Medical screening examination/treatment/procedure(s) were performed by non-physician practitioner and as supervising physician I was immediately available for consultation/collaboration.   Laray Anger, DO 08/05/12 2101

## 2012-08-07 ENCOUNTER — Ambulatory Visit (HOSPITAL_COMMUNITY): Payer: Medicare Other | Admitting: Specialist

## 2012-08-14 ENCOUNTER — Ambulatory Visit (HOSPITAL_COMMUNITY)
Admission: RE | Admit: 2012-08-14 | Discharge: 2012-08-14 | Disposition: A | Discharge status: Home / Self Care | Payer: Medicare Other | Source: Ambulatory Visit | Attending: Orthopedic Surgery | Admitting: Orthopedic Surgery

## 2012-08-14 DIAGNOSIS — Z96619 Presence of unspecified artificial shoulder joint: Secondary | ICD-10-CM | POA: Insufficient documentation

## 2012-08-14 DIAGNOSIS — M6281 Muscle weakness (generalized): Secondary | ICD-10-CM | POA: Insufficient documentation

## 2012-08-14 DIAGNOSIS — M25511 Pain in right shoulder: Secondary | ICD-10-CM

## 2012-08-14 DIAGNOSIS — M25519 Pain in unspecified shoulder: Secondary | ICD-10-CM | POA: Insufficient documentation

## 2012-08-14 DIAGNOSIS — IMO0001 Reserved for inherently not codable concepts without codable children: Secondary | ICD-10-CM | POA: Insufficient documentation

## 2012-08-14 DIAGNOSIS — M25619 Stiffness of unspecified shoulder, not elsewhere classified: Secondary | ICD-10-CM | POA: Insufficient documentation

## 2012-08-14 NOTE — Evaluation (Signed)
Occupational Therapy Evaluation  Patient Details  Name: [name] MRN: [id number] Date of Birth: 1960-11-12  Today's Date: 08/14/2012 Time: 1001-1045 OT Time Calculation (min): 44 min OT Evaluation 1001-1020 19' Manual Therapy 1020-1035 15' Visit#: 1  of 12   Re-eval: 09/11/12  Assessment Diagnosis: Right Shoulder Hemiarthroplasty Surgical Date:  (2 months ago - per patient report) Next MD Visit: 08/20/12 (08/20/12) Prior Therapy: home health for this diagnosis  Authorization: [employer] Medicare.  Medicaid is secondary and 3 visits have been used this year.  I had patient sign the medicaid form and have scanned it into her chart.  Authorization Time Period: before 10th visit  Authorization Visit#: 1  of 10    Past Medical History:  Past Medical History  Diagnosis Date  . Baker's cyst   . Pulmonary lesion 1/03    on RUL onn CT   . Hemorrhoids   . Zoster 2001  . Family history of diabetes mellitus   . Tobacco user   . Other abnormal Papanicolaou smear of cervix and cervical HPV(795.09)   . Herpes zoster   . Hemorrhoids   . Baker's cyst   . Pancreatitis   . HPV (human papilloma virus) infection   . HIV disease     take Prezista,Truvada,Norvir,and Isentress daily  . Bipolar disorder     With hx of psychotic features.  . Articular cartilage disorder of knee   . Bursitis of shoulder   . Rotator cuff arthropathy   . Hypertension     takes Clonidine and Verapamil daily  . Emphysema   . Asthma     Albuterol resue inhaler and QVAR daily  . Shortness of breath     with exertion and stress  . Pneumonia     hx of;last time 1 1/38yrs ago  . Headache   . Dizziness   . Arthritis   . Joint pain   . Joint swelling   . Chronic back pain     buldging disc  . Bruises easily   . Gastric ulcer   . GERD (gastroesophageal reflux disease)     takes Nexium daily  . Chronic constipation   . Urinary frequency   . Urinary incontinence   . Nocturia   . History of blood  transfusion at age 83  . Early cataracts, bilateral   . Anxiety   . Depression     takes Xanax daily and Depakote bid  . History of shingles   . 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 Right breast lumpectomy   . Total abdominal hysterectomy w/ bilateral salpingoophorectomy at age 79  . Breast surgery as a teenager    rt breast biopsy  . Cholecystectomy 10+yrs ago  . Knee arthroscopy     bilateral  . Appendectomy     with hysterectomy  . Finger surgery     rt middle finger with pin in it  . Epidural injections   . Esophagogastroduodenoscopy   . Shoulder hemi-arthroplasty 05/06/2012    Procedure: SHOULDER HEMI-ARTHROPLASTY;  Surgeon: Veronica Post, MD;  Location: [facility] OR;  Service: Orthopedics;  Laterality: Right;    Subjective S:  I want to be able to cook and clean for myself again. Pertinent History: [name] had a right shoulder hemiarthroplasty approximately 2 months ago.  She has been receiving home health physical therapy services, and is now being referred to occupational therapy for evaluation  and treatment.   Limitations: N/A - progress as tolerated Special Tests: UEFI 20/80 = 25% I level  Patient Stated Goals: "I want to get back to normal and use my right arm agian." Pain Assessment Currently in Pain?: Yes Pain Score:   7 Pain Location: Shoulder Pain Orientation: Right Pain Type: Acute pain  Precautions/Restrictions  Precautions Precautions: Shoulder (progress as tolerated)  Prior Functioning  Home Living Lives With: Alone Available Help at Discharge: Personal care attendant;Family Prior Function Level of Independence: Independent with basic ADLs;Independent with homemaking with ambulation Driving: No Vocation: Unemployed Leisure: Hobbies-yes (Comment) Comments: enjoys playing on the computer  Assessment ADL/Vision/Perception ADL ADL Comments:  difficulty dressing herself, fastening her bra, cooking, cleaning, reaching overhead Dominant Hand: Right Vision - History Baseline Vision: No visual deficits Perception Perception: Within Functional Limits Praxis Praxis: Intact  Cognition/Observation Cognition Overall Cognitive Status: Appears within functional limits for tasks assessed Observation/Other Assessments Observations: ambulates with a cane or walker  Sensation/Coordination/Edema Sensation Light Touch: Appears Intact Coordination Gross Motor Movements are Fluid and Coordinated: Yes Fine Motor Movements are Fluid and Coordinated: Yes  Additional Assessments RUE AROM (degrees) RUE Overall AROM Comments: assessed in seated.  ER/IR with shoulder adducted Right Shoulder Flexion: 90 Degrees Right Shoulder ABduction: 60 Degrees Right Shoulder Internal Rotation: 65 Degrees Right Shoulder External Rotation: 50 Degrees RUE PROM (degrees) RUE Overall PROM Comments: assessed in supine PROM is [facility] RUE Strength RUE Overall Strength Comments: assessed in seated  Right Shoulder Flexion:  (4-/5) Right Shoulder ABduction:  (4-/5) Right Shoulder Internal Rotation:  (4-/5) Right Shoulder External Rotation: 4/5 Palpation Palpation: moderate restrictions noted in the scapular region     Exercise/Treatments    Manual Therapy Manual Therapy: Myofascial release Myofascial Release: MFR and manual massage to right upper arm and scapular region to decrease pain and restrictions and increase pain free mobility.  Occupational Therapy Assessment and Plan OT Assessment and Plan Clinical Impression Statement: A:  [name] is a 51 year old female with an extensive past medical history that had a right shoulder hemiarthroplasty approximately 2 months ago.  She presents with decreased AROM and strength and increase pain and fascial restrictions, which are limiting her participation in her  daily activities. Pt will benefit from skilled  therapeutic intervention in order to improve on the following deficits: Decreased range of motion;Decreased strength;Increased fascial restricitons;Increased muscle spasms;Impaired UE functional use;Pain Rehab Potential: Good OT Frequency: Min 2X/week OT Duration: 6 weeks OT Treatment/Interventions: Self-care/ADL training;Therapeutic exercise;Therapeutic activities;Manual therapy;Modalities;Patient/family education OT Plan: P:  Skilled OT intervention is indicated to decrease pain and restrictions and increase pain free mobility and increased I with B/IADLs and leisure activities.  Treatment Plan:  MFR and manual stretching in supine.  PROM and AAROM in supine.  Seated elev, ext, row.  ball stretches, elbow - hand AROM.  Progress as tolerated.    Goals Short Term Goals Time to Complete Short Term Goals: 3 weeks Short Term Goal 1: Patient will be educated on a HEP. Short Term Goal 2: Patient will incease AROM to [facility] in supine for increased ability to pull up bedcovers. Short Term Goal 3: Patient will increase right shoulder strength to 4/5 for increased ability to lift pots and pans when cooking. Short Term Goal 4: Patient will decrease pain in her right shoulder to 5/10 when attempting to fix her hair. Short Term Goal 5: Patient will decrease fascial restrictions to min-mod in her right shoulder region. Long Term Goals Time to Complete Long Term Goals:  6 weeks Long Term Goal 1: Patient will return to prior level of independence with all B/IADLs and leisure activities. Long Term Goal 2: Patient will increase AROM to [facility] in seated in her right shoulder for increased ability to comb her hair and fasten her bra. Long Term Goal 3: Patient will increase her right shoulder strength to 4+/5 for increased ablity to lift pots and pans. Long Term Goal 4: Patient will decrease pain in her right shoulder to 2/10 during ADL tasks. Long Term Goal 5: Patient will decrease fascial restrictions in her right  shoulder to minimal.  Problem List Patient Active Problem List  Diagnosis  . ATYPICAL MYCOBACTERIAL INFECTION  . Human Immunodeficiency Virus (HIV) Disease  . HERPES ZOSTER  . IMPAIRED GLUCOSE TOLERANCE  . OBESITY, UNSPECIFIED  . TOBACCO USER  . DEPRESSION  . Unspecified essential hypertension  . HEMORRHOIDS  . SINUSITIS, ACUTE  . PHARYNGITIS  . OTHER CHRONIC SINUSITIS  . ALLERGIC RHINITIS DUE TO OTHER ALLERGEN  . ASTHMA  . GERD  . CONSTIPATION, INTERMITTENT  . ACUTE CYSTITIS  . GYNECOMASTIA  . HIP PAIN, LEFT  . NECK PAIN, LEFT  . BAKER'S CYST  . BACK PAIN WITH RADICULOPATHY  . FATIGUE  . HEADACHE  . NAUSEA  . URINARY INCONTINENCE  . ABDOMINAL PAIN  . ABNORMAL PAP SMEAR, LGSIL  . ANKLE SPRAIN  . PANCREATITIS, HX OF  . Bronchitis, acute  . Pain in joint, lower leg  . Stiffness of joint, not elsewhere classified, lower leg  . Difficulty in walking  . Drug induced acute pancreatitis  . Hypokalemia  . HIV (human immunodeficiency virus infection)  . Bilateral knee pain  . Avascular necrosis of femur head  . Chronic pancreatitis  . HTN (hypertension)  . GERD (gastroesophageal reflux disease)  . Elevated LFTs  . Anxiety  . Bipolar 1 disorder  . Functional gait disorder  . Abnormal thyroid blood test  . Dysuria  . Chest pain  . Shoulder pain, bilateral  . Vulvovaginitis due to Candida  . Abnormal EKG  . 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 Urinary incontinence  . Anal warts  . Pruritus  . Muscle weakness (generalized)  . Unstable shoulder hemiarthroplasty    End of Session Activity Tolerance: Patient tolerated treatment well General Behavior During Session: [facility] for tasks performed Cognition: [facility] for tasks performed OT Plan of Care OT Home Exercise Plan: Educated on towel slides and dowel rod exercises in supine.  GO Functional Assessment Tool Used: UEFI scored 25% independence level and 75% impairment level Functional  Limitation: Self care Self Care Current Status (Z6109): At least 60 percent but less than 80 percent impaired, limited or restricted Self Care Goal Status (U0454): At least 20 percent but less than 40 percent impaired, limited or restricted  Shirlean Mylar, OTR/L  08/14/2012, 11:35 AM  Physician Documentation Your signature is required to indicate approval of the treatment plan as stated above.  Please sign and either send electronically or make a copy of this report for your files and return this physician signed original.  Please mark one 1.__approve of plan  2. ___approve of plan with the following conditions.   ______________________________  _____________________ Physician Signature                                                                                                             Date

## 2012-08-19 ENCOUNTER — Ambulatory Visit (HOSPITAL_COMMUNITY)
Admission: RE | Admit: 2012-08-19 | Discharge: 2012-08-19 | Disposition: A | Discharge status: Home / Self Care | Payer: Medicare Other | Source: Ambulatory Visit | Attending: Orthopedic Surgery | Admitting: Orthopedic Surgery

## 2012-08-19 DIAGNOSIS — M25519 Pain in unspecified shoulder: Secondary | ICD-10-CM | POA: Insufficient documentation

## 2012-08-19 DIAGNOSIS — M25619 Stiffness of unspecified shoulder, not elsewhere classified: Secondary | ICD-10-CM | POA: Insufficient documentation

## 2012-08-19 DIAGNOSIS — T84028A Dislocation of other internal joint prosthesis, initial encounter: Secondary | ICD-10-CM

## 2012-08-19 DIAGNOSIS — I10 Essential (primary) hypertension: Secondary | ICD-10-CM | POA: Insufficient documentation

## 2012-08-19 DIAGNOSIS — IMO0001 Reserved for inherently not codable concepts without codable children: Secondary | ICD-10-CM | POA: Insufficient documentation

## 2012-08-19 DIAGNOSIS — M6281 Muscle weakness (generalized): Secondary | ICD-10-CM | POA: Insufficient documentation

## 2012-08-19 NOTE — Progress Notes (Signed)
Occupational Therapy Treatment Patient Details  Name: [name] MRN: [id number] Date of Birth: Sep 24, 1961  Today's Date: 08/19/2012 Time: 6295-2841 OT Time Calculation (min): 48 min Manual Therapy 159-226 27' Therapeutic Exercise 227-247 20'  Visit#: 2  of 12   Re-eval: 09/11/12 Assessment Diagnosis: Right Shoulder Hemiarthroplasty Next MD Visit: 08/20/12  Authorization: [employer] Medicare. Medicaid is secondary and 3 visits have been used this year. I had patient sign the medicaid form and have scanned it into her chart.   Authorization Time Period: before 10th visit   Authorization Visit#: 2  of 10   Subjective Symptoms/Limitations Symptoms: S:  It has been hurting a lot since last time.  I don't know what is wrong with it. Limitations: N/A - progress as tolerated  Pain Assessment Currently in Pain?: Yes Pain Score:   8 Pain Location: Shoulder Pain Orientation: Right Pain Type: Acute pain  Precautions/Restrictions  Precautions Precautions: Shoulder  Exercise/Treatments Supine Protraction: AAROM;5 reps Horizontal ABduction: AAROM;5 reps External Rotation: AAROM;5 reps Internal Rotation: AAROM;5 reps Flexion: AAROM;5 reps ABduction: AAROM;5 reps Seated Elevation: AROM;10 reps Extension: AROM;10 reps Row: AROM;10 reps Therapy Ball Flexion: 15 reps ABduction: 15 reps      Manual Therapy Manual Therapy: Myofascial release Myofascial Release: MFR and manual massage to right upper arm and scapular region to decrease pain and restrictions and increase pain free mobility 159-226 27''  Occupational Therapy Assessment and Plan OT Assessment and Plan Clinical Impression Statement: A:  Added multiple new exercises; AAROM supine-patient could only tolerate 5 reps, scapular AROM and ball stretches.  Pt. requested ice at end of treatment but only wanted 5' of ice because she had to go.  Multiple restrictions felt in upper trap and along scapula.  Pain decreased to 5/10  after session. OT Plan: A:  Attempt to increase reps with AAROM.   Goals Short Term Goals Time to Complete Short Term Goals: 3 weeks Short Term Goal 1: Patient will be educated on a HEP. Short Term Goal 2: Patient will incease AROM to [facility] in supine for increased ability to pull up bedcovers. Short Term Goal 3: Patient will increase right shoulder strength to 4/5 for increased ability to lift pots and pans when cooking. Short Term Goal 4: Patient will decrease pain in her right shoulder to 5/10 when attempting to fix her hair. Short Term Goal 5: Patient will decrease fascial restrictions to min-mod in her right shoulder region. Long Term Goals Time to Complete Long Term Goals: 6 weeks Long Term Goal 1: Patient will return to prior level of independence with all B/IADLs and leisure activities. Long Term Goal 2: Patient will increase AROM to [facility] in seated in her right shoulder for increased ability to comb her hair and fasten her bra. Long Term Goal 3: Patient will increase her right shoulder strength to 4+/5 for increased ablity to lift pots and pans. Long Term Goal 4: Patient will decrease pain in her right shoulder to 2/10 during ADL tasks. Long Term Goal 5: Patient will decrease fascial restrictions in her right shoulder to minimal.  Problem List Patient Active Problem List  Diagnosis  . ATYPICAL MYCOBACTERIAL INFECTION  . Human Immunodeficiency Virus (HIV) Disease  . HERPES ZOSTER  . IMPAIRED GLUCOSE TOLERANCE  . OBESITY, UNSPECIFIED  . TOBACCO USER  . DEPRESSION  . Unspecified essential hypertension  . HEMORRHOIDS  . SINUSITIS, ACUTE  . PHARYNGITIS  . OTHER CHRONIC SINUSITIS  . ALLERGIC RHINITIS DUE TO OTHER ALLERGEN  . ASTHMA  .  GERD  . CONSTIPATION, INTERMITTENT  . ACUTE CYSTITIS  . GYNECOMASTIA  . HIP PAIN, LEFT  . NECK PAIN, LEFT  . BAKER'S CYST  . BACK PAIN WITH RADICULOPATHY  . FATIGUE  . HEADACHE  . NAUSEA  . URINARY INCONTINENCE  . ABDOMINAL PAIN  .  ABNORMAL PAP SMEAR, LGSIL  . ANKLE SPRAIN  . PANCREATITIS, HX OF  . Bronchitis, acute  . Pain in joint, lower leg  . Stiffness of joint, not elsewhere classified, lower leg  . Difficulty in walking  . Drug induced acute pancreatitis  . Hypokalemia  . HIV (human immunodeficiency virus infection)  . Bilateral knee pain  . Avascular necrosis of femur head  . Chronic pancreatitis  . HTN (hypertension)  . GERD (gastroesophageal reflux disease)  . Elevated LFTs  . Anxiety  . Bipolar 1 disorder  . Functional gait disorder  . Abnormal thyroid blood test  . Dysuria  . Chest pain  . Shoulder pain, bilateral  . Vulvovaginitis due to Candida  . Abnormal EKG  . 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 Urinary incontinence  . Anal warts  . Pruritus  . Muscle weakness (generalized)  . Unstable shoulder hemiarthroplasty    End of Session Activity Tolerance: Patient tolerated treatment well General Cognition: [facility] for tasks performed  GO   Adonis Ryther L. Matalynn Graff, COTA/L  08/19/2012, 3:36 PM

## 2012-08-20 LAB — HIV-1 INTEGRASE GENOTYPE

## 2012-08-22 ENCOUNTER — Ambulatory Visit (HOSPITAL_COMMUNITY)
Admission: RE | Admit: 2012-08-22 | Discharge: 2012-08-22 | Disposition: A | Discharge status: Home / Self Care | Payer: Medicare Other | Source: Ambulatory Visit | Attending: Orthopedic Surgery | Admitting: Orthopedic Surgery

## 2012-08-22 DIAGNOSIS — M25619 Stiffness of unspecified shoulder, not elsewhere classified: Secondary | ICD-10-CM | POA: Insufficient documentation

## 2012-08-22 DIAGNOSIS — T84028A Dislocation of other internal joint prosthesis, initial encounter: Secondary | ICD-10-CM

## 2012-08-22 DIAGNOSIS — M6281 Muscle weakness (generalized): Secondary | ICD-10-CM | POA: Insufficient documentation

## 2012-08-22 DIAGNOSIS — M25519 Pain in unspecified shoulder: Secondary | ICD-10-CM | POA: Insufficient documentation

## 2012-08-22 DIAGNOSIS — IMO0001 Reserved for inherently not codable concepts without codable children: Secondary | ICD-10-CM | POA: Insufficient documentation

## 2012-08-22 NOTE — Progress Notes (Signed)
Occupational Therapy Treatment Patient Details  Name: [name] MRN: [id number] Date of Birth: 14-Aug-1961  Today's Date: 08/22/2012 Time: 5284-1324 OT Time Calculation (min): 39 min Massage 4010-2725 17' Therex 3664-4034 22'  Visit#: 3  of 12   Re-eval: 09/11/12    Authorization: [employer] Medicare. Medicaid is secondary and 3 visits have been used this year. I had patient sign the medicaid form and have scanned it into her chart.   Authorization Time Period: before 10th visit   Authorization Visit#: 3  of 10   Subjective Symptoms/Limitations Symptoms: S: I have a lot of tightness in my shoulder today. Pain Assessment Currently in Pain?: Yes Pain Score:   7 Pain Location: Shoulder Pain Orientation: Right Pain Type: Acute pain  Precautions/Restrictions   shoulder (advance as tolerated)  Exercise/Treatments Supine Protraction: AAROM;10 reps Horizontal ABduction: AAROM External Rotation: AAROM;10 reps Internal Rotation: AAROM;10 reps Flexion: AAROM;Limitations (7 reps) Flexion Limitations: decreased ROM; approx. 5 degrees flexion ABduction: AAROM (6 reps) Seated Elevation: AROM;15 reps Extension: AROM;15 reps Row: AROM;15 reps Therapy Ball Flexion: 15 reps ABduction: 15 reps     Manual Therapy Manual Therapy: Massage Massage: Manual massage to right upper trapezius region to decrease pain and restrictions and increase pain free mobility.  Occupational Therapy Assessment and Plan OT Assessment and Plan Clinical Impression Statement: A: Increased reps with AAROM. Pt was unable to reach 10 reps for all AAROM exercises. Pt had difficulty with shoulder flexion supine; only able to achieve ~5 degrees. Restrictions only felt in upper trap area. Pain decreased to 2/10 at end of session. OT Plan: A: Cont. to increase reps with AAROM.   Goals Short Term Goals Time to Complete Short Term Goals: 3 weeks Short Term Goal 1: Patient will be educated on a HEP. Short Term  Goal 1 Progress: Progressing toward goal Short Term Goal 2: Patient will incease AROM to [facility] in supine for increased ability to pull up bedcovers. Short Term Goal 2 Progress: Progressing toward goal Short Term Goal 3: Patient will increase right shoulder strength to 4/5 for increased ability to lift pots and pans when cooking. Short Term Goal 3 Progress: Progressing toward goal Short Term Goal 4: Patient will decrease pain in her right shoulder to 5/10 when attempting to fix her hair. Short Term Goal 4 Progress: Progressing toward goal Short Term Goal 5: Patient will decrease fascial restrictions to min-mod in her right shoulder region. Short Term Goal 5 Progress: Progressing toward goal Long Term Goals Time to Complete Long Term Goals: 6 weeks Long Term Goal 1: Patient will return to prior level of independence with all B/IADLs and leisure activities. Long Term Goal 1 Progress: Progressing toward goal Long Term Goal 2: Patient will increase AROM to [facility] in seated in her right shoulder for increased ability to comb her hair and fasten her bra. Long Term Goal 2 Progress: Progressing toward goal Long Term Goal 3: Patient will increase her right shoulder strength to 4+/5 for increased ablity to lift pots and pans. Long Term Goal 3 Progress: Progressing toward goal Long Term Goal 4: Patient will decrease pain in her right shoulder to 2/10 during ADL tasks. Long Term Goal 4 Progress: Progressing toward goal Long Term Goal 5: Patient will decrease fascial restrictions in her right shoulder to minimal. Long Term Goal 5 Progress: Progressing toward goal  Problem List Patient Active Problem List  Diagnosis  . ATYPICAL MYCOBACTERIAL INFECTION  . Human Immunodeficiency Virus (HIV) Disease  . HERPES ZOSTER  . IMPAIRED  GLUCOSE TOLERANCE  . OBESITY, UNSPECIFIED  . TOBACCO USER  . DEPRESSION  . Unspecified essential hypertension  . HEMORRHOIDS  . SINUSITIS, ACUTE  . PHARYNGITIS  . OTHER CHRONIC  SINUSITIS  . ALLERGIC RHINITIS DUE TO OTHER ALLERGEN  . ASTHMA  . GERD  . CONSTIPATION, INTERMITTENT  . ACUTE CYSTITIS  . GYNECOMASTIA  . HIP PAIN, LEFT  . NECK PAIN, LEFT  . BAKER'S CYST  . BACK PAIN WITH RADICULOPATHY  . FATIGUE  . HEADACHE  . NAUSEA  . URINARY INCONTINENCE  . ABDOMINAL PAIN  . ABNORMAL PAP SMEAR, LGSIL  . ANKLE SPRAIN  . PANCREATITIS, HX OF  . Bronchitis, acute  . Pain in joint, lower leg  . Stiffness of joint, not elsewhere classified, lower leg  . Difficulty in walking  . Drug induced acute pancreatitis  . Hypokalemia  . HIV (human immunodeficiency virus infection)  . Bilateral knee pain  . Avascular necrosis of femur head  . Chronic pancreatitis  . HTN (hypertension)  . GERD (gastroesophageal reflux disease)  . Elevated LFTs  . Anxiety  . Bipolar 1 disorder  . Functional gait disorder  . Abnormal thyroid blood test  . Dysuria  . Chest pain  . Shoulder pain, bilateral  . Vulvovaginitis due to Candida  . Abnormal EKG  . 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 Urinary incontinence  . Anal warts  . Pruritus  . Muscle weakness (generalized)  . Unstable shoulder hemiarthroplasty    End of Session Activity Tolerance: Patient limited by pain;Patient tolerated treatment well General Behavior During Session: [facility] for tasks performed Cognition: [facility] for tasks performed   Limmie Patricia, OTR/L 08/22/2012, 2:49 PM

## 2012-08-25 ENCOUNTER — Telehealth: Payer: Self-pay | Admitting: Family Medicine

## 2012-08-25 NOTE — Telephone Encounter (Signed)
Spoke with , she never "heard of appt " for derm to see anal warts. I advised her to call gyne to follow and treat as needed, she was prescribed aldaaby me, may also need biopsy,I expalained this to her. She verbalized understanding and states she will call [facility]ne for appt. Still has identified no new PCP

## 2012-08-27 ENCOUNTER — Ambulatory Visit (HOSPITAL_COMMUNITY): Payer: Medicare Other | Admitting: Occupational Therapy

## 2012-08-28 ENCOUNTER — Telehealth: Payer: Self-pay | Admitting: Licensed Clinical Social Worker

## 2012-08-28 NOTE — Telephone Encounter (Signed)
Patient called stating that she was discharged from her current pcp and needed her blood pressure prescriptions and "other medications" filled for 30 days until she can find another pcp. I advised her to try [facility] [place] [facility] [place]. She said she would call. She also stated she would call back with the names of the medication that needed to be filled.

## 2012-08-29 ENCOUNTER — Inpatient Hospital Stay (HOSPITAL_COMMUNITY): Admission: RE | Admit: 2012-08-29 | Payer: Medicare Other | Source: Ambulatory Visit | Admitting: Occupational Therapy

## 2012-09-03 ENCOUNTER — Ambulatory Visit (HOSPITAL_COMMUNITY): Payer: Medicare Other | Admitting: Occupational Therapy

## 2012-09-04 ENCOUNTER — Other Ambulatory Visit: Payer: Self-pay | Admitting: Licensed Clinical Social Worker

## 2012-09-04 DIAGNOSIS — I10 Essential (primary) hypertension: Secondary | ICD-10-CM

## 2012-09-04 DIAGNOSIS — R197 Diarrhea, unspecified: Secondary | ICD-10-CM

## 2012-09-04 MED ORDER — VERAPAMIL HCL ER 240 MG PO TBCR: 240.0000 mg | EXTENDED_RELEASE_TABLET | Freq: Every day | ORAL | Status: DC

## 2012-09-04 MED ORDER — CLONIDINE HCL 0.2 MG PO TABS: 0.1000 mg | ORAL_TABLET | Freq: Every day | ORAL | Status: DC

## 2012-09-04 MED ORDER — DIPHENOXYLATE-ATROPINE 2.5-0.025 MG PO TABS: 1.0000 | ORAL_TABLET | Freq: Four times a day (QID) | ORAL | Status: DC | PRN

## 2012-09-05 ENCOUNTER — Ambulatory Visit (HOSPITAL_COMMUNITY)
Admission: RE | Admit: 2012-09-05 | Discharge: 2012-09-05 | Disposition: A | Discharge status: Home / Self Care | Payer: Medicare Other | Source: Ambulatory Visit | Attending: Family Medicine | Admitting: Family Medicine

## 2012-09-05 DIAGNOSIS — T84028A Dislocation of other internal joint prosthesis, initial encounter: Secondary | ICD-10-CM

## 2012-09-05 DIAGNOSIS — M6281 Muscle weakness (generalized): Secondary | ICD-10-CM

## 2012-09-05 NOTE — Progress Notes (Signed)
Occupational Therapy Treatment Patient Details  Name: [name] MRN: [id number] Date of Birth: 1960-11-23  Today's Date: 09/05/2012 Time: 5784-6962 OT Time Calculation (min): 38 min MFR 9528-4132 21' Therex 4401-0272 17'  Visit#: 4  of 12   Re-eval: 09/11/12    Authorization: [employer] Medicare. Medicaid is secondary and 3 visits have been used this year.  Authorization Time Period: before 10th visit   Authorization Visit#: 4  of 10   Subjective Symptoms/Limitations Symptoms: S:  I fell the other day on my shoulder, Idon't know what I should do. Pain Assessment Currently in Pain?: Yes Pain Score:   9 Pain Location: Shoulder   Exercise/Treatments Supine Protraction: AAROM;10 reps Horizontal ABduction: AAROM;10 reps External Rotation: AAROM;10 reps Internal Rotation: AAROM;10 reps Flexion: AAROM;10 reps ABduction: AAROM;10 reps Seated Elevation: AROM;15 reps Extension: AROM;15 reps Row: AROM;15 reps Therapy Ball Flexion: 15 reps;Limitations Flexion Limitations: ROM limited due to pain ABduction: 15 reps;Limitations ABduction Limitations: ROM limited due to pain      Manual Therapy Manual Therapy: Myofascial release Myofascial Release: MFR and manual massage to right upper arm and scapular region to decrease pain and restrictions and increase pain free mobility   Occupational Therapy Assessment and Plan OT Assessment and Plan Clinical Impression Statement: A: Pt reports falling at home recently and has not gone to the doctor. Pain is unchanged since the fall, but still restricting full potential. Pain reduced to 6/10 at end of session. OT Plan: P: Cont. POC   Goals Short Term Goals Time to Complete Short Term Goals: 3 weeks Short Term Goal 1: Patient will be educated on a HEP. Short Term Goal 1 Progress: Progressing toward goal Short Term Goal 2: Patient will incease AROM to [facility] in supine for increased ability to pull up bedcovers. Short Term Goal 2  Progress: Progressing toward goal Short Term Goal 3: Patient will increase right shoulder strength to 4/5 for increased ability to lift pots and pans when cooking. Short Term Goal 3 Progress: Progressing toward goal Short Term Goal 4: Patient will decrease pain in her right shoulder to 5/10 when attempting to fix her hair. Short Term Goal 4 Progress: Progressing toward goal Short Term Goal 5: Patient will decrease fascial restrictions to min-mod in her right shoulder region. Short Term Goal 5 Progress: Progressing toward goal Long Term Goals Time to Complete Long Term Goals: 6 weeks Long Term Goal 1: Patient will return to prior level of independence with all B/IADLs and leisure activities. Long Term Goal 1 Progress: Progressing toward goal Long Term Goal 2: Patient will increase AROM to [facility] in seated in her right shoulder for increased ability to comb her hair and fasten her bra. Long Term Goal 2 Progress: Progressing toward goal Long Term Goal 3: Patient will increase her right shoulder strength to 4+/5 for increased ablity to lift pots and pans. Long Term Goal 3 Progress: Progressing toward goal Long Term Goal 4: Patient will decrease pain in her right shoulder to 2/10 during ADL tasks. Long Term Goal 4 Progress: Progressing toward goal Long Term Goal 5: Patient will decrease fascial restrictions in her right shoulder to minimal. Long Term Goal 5 Progress: Progressing toward goal  Problem List Patient Active Problem List  Diagnosis  . ATYPICAL MYCOBACTERIAL INFECTION  . Human Immunodeficiency Virus (HIV) Disease  . HERPES ZOSTER  . IMPAIRED GLUCOSE TOLERANCE  . OBESITY, UNSPECIFIED  . TOBACCO USER  . DEPRESSION  . Unspecified essential hypertension  . HEMORRHOIDS  . SINUSITIS, ACUTE  .  PHARYNGITIS  . OTHER CHRONIC SINUSITIS  . ALLERGIC RHINITIS DUE TO OTHER ALLERGEN  . ASTHMA  . GERD  . CONSTIPATION, INTERMITTENT  . ACUTE CYSTITIS  . GYNECOMASTIA  . HIP PAIN, LEFT  . NECK  PAIN, LEFT  . BAKER'S CYST  . BACK PAIN WITH RADICULOPATHY  . FATIGUE  . HEADACHE  . NAUSEA  . URINARY INCONTINENCE  . ABDOMINAL PAIN  . ABNORMAL PAP SMEAR, LGSIL  . ANKLE SPRAIN  . PANCREATITIS, HX OF  . Bronchitis, acute  . Pain in joint, lower leg  . Stiffness of joint, not elsewhere classified, lower leg  . Difficulty in walking  . Drug induced acute pancreatitis  . Hypokalemia  . HIV (human immunodeficiency virus infection)  . Bilateral knee pain  . Avascular necrosis of femur head  . Chronic pancreatitis  . HTN (hypertension)  . GERD (gastroesophageal reflux disease)  . Elevated LFTs  . Anxiety  . Bipolar 1 disorder  . Functional gait disorder  . Abnormal thyroid blood test  . Dysuria  . Chest pain  . Shoulder pain, bilateral  . Vulvovaginitis due to Candida  . Abnormal EKG  . 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 Urinary incontinence  . Anal warts  . Pruritus  . Muscle weakness (generalized)  . Unstable shoulder hemiarthroplasty    End of Session Activity Tolerance: Patient limited by pain;Patient tolerated treatment well General Behavior During Session: [facility] for tasks performed Cognition: [facility] for tasks performed   Limmie Patricia, OTR/L 09/05/2012, 2:50 PM  Tx session initiated by Theophilus Bones, COTA and completed by Limmie Patricia, OTR/L

## 2012-09-10 ENCOUNTER — Inpatient Hospital Stay (HOSPITAL_COMMUNITY): Admission: RE | Admit: 2012-09-10 | Payer: Medicare Other | Source: Ambulatory Visit | Admitting: Specialist

## 2012-09-11 ENCOUNTER — Inpatient Hospital Stay (HOSPITAL_COMMUNITY): Admission: RE | Admit: 2012-09-11 | Payer: Medicare Other | Source: Ambulatory Visit | Admitting: Occupational Therapy

## 2012-09-16 ENCOUNTER — Inpatient Hospital Stay (HOSPITAL_COMMUNITY): Admission: RE | Admit: 2012-09-16 | Payer: Medicare Other | Source: Ambulatory Visit | Admitting: Occupational Therapy

## 2012-09-16 ENCOUNTER — Telehealth: Payer: Self-pay | Admitting: Licensed Clinical Social Worker

## 2012-09-16 ENCOUNTER — Other Ambulatory Visit: Payer: Self-pay | Admitting: Licensed Clinical Social Worker

## 2012-09-16 DIAGNOSIS — R110 Nausea: Secondary | ICD-10-CM

## 2012-09-16 MED ORDER — PROMETHAZINE HCL 25 MG PO TABS: 25.0000 mg | ORAL_TABLET | Freq: Four times a day (QID) | ORAL | Status: AC | PRN

## 2012-09-16 NOTE — Telephone Encounter (Signed)
This patient was discharged from her former PCP, and she would like Dr. Ninetta Lights to fill her xanax prescription until she can find another PCP. Please advise.

## 2012-09-16 NOTE — Telephone Encounter (Signed)
Ok, thanks.

## 2012-09-26 ENCOUNTER — Telehealth: Payer: Self-pay | Admitting: *Deleted

## 2012-09-26 NOTE — Telephone Encounter (Signed)
Patient called and left a message wanting the name and number of the PCP office in our building. Called her back and left the message on her phone that it is [place] [facility] [place] [phone].

## 2012-10-13 ENCOUNTER — Other Ambulatory Visit: Payer: Self-pay | Admitting: Licensed Clinical Social Worker

## 2012-10-17 ENCOUNTER — Other Ambulatory Visit: Payer: Self-pay | Admitting: *Deleted

## 2012-10-17 DIAGNOSIS — R110 Nausea: Secondary | ICD-10-CM

## 2012-10-17 MED ORDER — PROMETHAZINE HCL 25 MG PO TABS: 25.0000 mg | ORAL_TABLET | Freq: Four times a day (QID) | ORAL | Status: DC | PRN

## 2012-10-17 NOTE — Telephone Encounter (Signed)
Phenergan previously on medication list, fell off medication list when refilled in November 2013.

## 2012-10-25 ENCOUNTER — Encounter (HOSPITAL_COMMUNITY): Payer: Self-pay | Admitting: *Deleted

## 2012-10-25 ENCOUNTER — Observation Stay (HOSPITAL_COMMUNITY)
Admission: EM | Admit: 2012-10-25 | Discharge: 2012-10-27 | Disposition: A | Discharge status: Home / Self Care | Payer: Medicare Other | Attending: Internal Medicine | Admitting: Internal Medicine

## 2012-10-25 ENCOUNTER — Emergency Department (HOSPITAL_COMMUNITY): Payer: Medicare Other

## 2012-10-25 DIAGNOSIS — F3289 Other specified depressive episodes: Secondary | ICD-10-CM

## 2012-10-25 DIAGNOSIS — M8700 Idiopathic aseptic necrosis of unspecified bone: Secondary | ICD-10-CM

## 2012-10-25 DIAGNOSIS — J029 Acute pharyngitis, unspecified: Secondary | ICD-10-CM

## 2012-10-25 DIAGNOSIS — R262 Difficulty in walking, not elsewhere classified: Secondary | ICD-10-CM

## 2012-10-25 DIAGNOSIS — R8789 Other abnormal findings in specimens from female genital organs: Secondary | ICD-10-CM

## 2012-10-25 DIAGNOSIS — J209 Acute bronchitis, unspecified: Principal | ICD-10-CM

## 2012-10-25 DIAGNOSIS — K649 Unspecified hemorrhoids: Secondary | ICD-10-CM

## 2012-10-25 DIAGNOSIS — F319 Bipolar disorder, unspecified: Secondary | ICD-10-CM

## 2012-10-25 DIAGNOSIS — L299 Pruritus, unspecified: Secondary | ICD-10-CM

## 2012-10-25 DIAGNOSIS — A630 Anogenital (venereal) warts: Secondary | ICD-10-CM

## 2012-10-25 DIAGNOSIS — IMO0002 Reserved for concepts with insufficient information to code with codable children: Secondary | ICD-10-CM

## 2012-10-25 DIAGNOSIS — M25569 Pain in unspecified knee: Secondary | ICD-10-CM

## 2012-10-25 DIAGNOSIS — M25562 Pain in left knee: Secondary | ICD-10-CM

## 2012-10-25 DIAGNOSIS — T50905A Adverse effect of unspecified drugs, medicaments and biological substances, initial encounter: Secondary | ICD-10-CM

## 2012-10-25 DIAGNOSIS — R9431 Abnormal electrocardiogram [ECG] [EKG]: Secondary | ICD-10-CM

## 2012-10-25 DIAGNOSIS — T84028A Dislocation of other internal joint prosthesis, initial encounter: Secondary | ICD-10-CM

## 2012-10-25 DIAGNOSIS — R6889 Other general symptoms and signs: Secondary | ICD-10-CM

## 2012-10-25 DIAGNOSIS — B029 Zoster without complications: Secondary | ICD-10-CM

## 2012-10-25 DIAGNOSIS — M25669 Stiffness of unspecified knee, not elsewhere classified: Secondary | ICD-10-CM

## 2012-10-25 DIAGNOSIS — R300 Dysuria: Secondary | ICD-10-CM

## 2012-10-25 DIAGNOSIS — R269 Unspecified abnormalities of gait and mobility: Secondary | ICD-10-CM

## 2012-10-25 DIAGNOSIS — B373 Candidiasis of vulva and vagina: Secondary | ICD-10-CM

## 2012-10-25 DIAGNOSIS — E876 Hypokalemia: Secondary | ICD-10-CM

## 2012-10-25 DIAGNOSIS — B3731 Acute candidiasis of vulva and vagina: Secondary | ICD-10-CM

## 2012-10-25 DIAGNOSIS — M8710 Osteonecrosis due to drugs, unspecified bone: Secondary | ICD-10-CM

## 2012-10-25 DIAGNOSIS — M7120 Synovial cyst of popliteal space [Baker], unspecified knee: Secondary | ICD-10-CM

## 2012-10-25 DIAGNOSIS — N3000 Acute cystitis without hematuria: Secondary | ICD-10-CM

## 2012-10-25 DIAGNOSIS — Z8719 Personal history of other diseases of the digestive system: Secondary | ICD-10-CM

## 2012-10-25 DIAGNOSIS — J45909 Unspecified asthma, uncomplicated: Secondary | ICD-10-CM

## 2012-10-25 DIAGNOSIS — E669 Obesity, unspecified: Secondary | ICD-10-CM

## 2012-10-25 DIAGNOSIS — R110 Nausea: Secondary | ICD-10-CM

## 2012-10-25 DIAGNOSIS — K5900 Constipation, unspecified: Secondary | ICD-10-CM

## 2012-10-25 DIAGNOSIS — J3089 Other allergic rhinitis: Secondary | ICD-10-CM

## 2012-10-25 DIAGNOSIS — K861 Other chronic pancreatitis: Secondary | ICD-10-CM

## 2012-10-25 DIAGNOSIS — J441 Chronic obstructive pulmonary disease with (acute) exacerbation: Secondary | ICD-10-CM

## 2012-10-25 DIAGNOSIS — D649 Anemia, unspecified: Secondary | ICD-10-CM

## 2012-10-25 DIAGNOSIS — R5381 Other malaise: Secondary | ICD-10-CM

## 2012-10-25 DIAGNOSIS — J019 Acute sinusitis, unspecified: Secondary | ICD-10-CM

## 2012-10-25 DIAGNOSIS — B20 Human immunodeficiency virus [HIV] disease: Secondary | ICD-10-CM

## 2012-10-25 DIAGNOSIS — M25559 Pain in unspecified hip: Secondary | ICD-10-CM

## 2012-10-25 DIAGNOSIS — K219 Gastro-esophageal reflux disease without esophagitis: Secondary | ICD-10-CM

## 2012-10-25 DIAGNOSIS — J111 Influenza due to unidentified influenza virus with other respiratory manifestations: Secondary | ICD-10-CM

## 2012-10-25 DIAGNOSIS — Z720 Tobacco use: Secondary | ICD-10-CM

## 2012-10-25 DIAGNOSIS — I10 Essential (primary) hypertension: Secondary | ICD-10-CM

## 2012-10-25 DIAGNOSIS — E739 Lactose intolerance, unspecified: Secondary | ICD-10-CM

## 2012-10-25 DIAGNOSIS — R32 Unspecified urinary incontinence: Secondary | ICD-10-CM

## 2012-10-25 DIAGNOSIS — D72819 Decreased white blood cell count, unspecified: Secondary | ICD-10-CM

## 2012-10-25 DIAGNOSIS — A319 Mycobacterial infection, unspecified: Secondary | ICD-10-CM

## 2012-10-25 DIAGNOSIS — R739 Hyperglycemia, unspecified: Secondary | ICD-10-CM

## 2012-10-25 DIAGNOSIS — M25511 Pain in right shoulder: Secondary | ICD-10-CM

## 2012-10-25 DIAGNOSIS — F329 Major depressive disorder, single episode, unspecified: Secondary | ICD-10-CM

## 2012-10-25 DIAGNOSIS — R109 Unspecified abdominal pain: Secondary | ICD-10-CM

## 2012-10-25 DIAGNOSIS — M6281 Muscle weakness (generalized): Secondary | ICD-10-CM

## 2012-10-25 DIAGNOSIS — R7309 Other abnormal glucose: Secondary | ICD-10-CM | POA: Insufficient documentation

## 2012-10-25 DIAGNOSIS — R7989 Other specified abnormal findings of blood chemistry: Secondary | ICD-10-CM

## 2012-10-25 DIAGNOSIS — Z96619 Presence of unspecified artificial shoulder joint: Secondary | ICD-10-CM

## 2012-10-25 DIAGNOSIS — F419 Anxiety disorder, unspecified: Secondary | ICD-10-CM

## 2012-10-25 DIAGNOSIS — M542 Cervicalgia: Secondary | ICD-10-CM

## 2012-10-25 DIAGNOSIS — F17200 Nicotine dependence, unspecified, uncomplicated: Secondary | ICD-10-CM | POA: Diagnosis present

## 2012-10-25 DIAGNOSIS — J328 Other chronic sinusitis: Secondary | ICD-10-CM

## 2012-10-25 DIAGNOSIS — S93409A Sprain of unspecified ligament of unspecified ankle, initial encounter: Secondary | ICD-10-CM

## 2012-10-25 DIAGNOSIS — Z21 Asymptomatic human immunodeficiency virus [HIV] infection status: Secondary | ICD-10-CM

## 2012-10-25 DIAGNOSIS — M25561 Pain in right knee: Secondary | ICD-10-CM

## 2012-10-25 DIAGNOSIS — R51 Headache: Secondary | ICD-10-CM

## 2012-10-25 DIAGNOSIS — N62 Hypertrophy of breast: Secondary | ICD-10-CM

## 2012-10-25 HISTORY — DX: Tobacco use: Z72.0

## 2012-10-25 LAB — CBC WITH DIFFERENTIAL/PLATELET
Basophils Absolute: 0 10*3/uL (ref 0.0–0.1)
Basophils Relative: 0 % (ref 0–1)
Eosinophils Absolute: 0 10*3/uL (ref 0.0–0.7)
Eosinophils Relative: 0 % (ref 0–5)
HCT: 44.2 % (ref 36.0–46.0)
MCHC: 33 g/dL (ref 30.0–36.0)
MCV: 87 fL (ref 78.0–100.0)
Monocytes Absolute: 0.5 10*3/uL (ref 0.1–1.0)
RDW: 14 % (ref 11.5–15.5)

## 2012-10-25 LAB — BASIC METABOLIC PANEL
Calcium: 9.6 mg/dL (ref 8.4–10.5)
Creatinine, Ser: 0.68 mg/dL (ref 0.50–1.10)
GFR calc Af Amer: >90 mL/min (ref >90)

## 2012-10-25 MED ORDER — METHYLPREDNISOLONE SODIUM SUCC 125 MG IJ SOLR
125.0000 mg | Freq: Once | INTRAMUSCULAR | Status: AC
  Administered 2012-10-25: 125 mg via INTRAVENOUS
  Filled 2012-10-25: qty 2

## 2012-10-25 MED ORDER — SODIUM CHLORIDE 0.9 % IV SOLN: INTRAVENOUS | Status: DC

## 2012-10-25 MED ORDER — ONDANSETRON HCL 4 MG/2ML IJ SOLN
4.0000 mg | Freq: Once | INTRAMUSCULAR | Status: AC
  Administered 2012-10-25: 4 mg via INTRAVENOUS
  Filled 2012-10-25: qty 2

## 2012-10-25 MED ORDER — SODIUM CHLORIDE 0.9 % IV BOLUS (SEPSIS)
500.0000 mL | Freq: Once | INTRAVENOUS | Status: AC
  Administered 2012-10-25: 500 mL via INTRAVENOUS

## 2012-10-25 MED ORDER — SODIUM CHLORIDE 0.9 % IV SOLN
INTRAVENOUS | Status: DC
  Administered 2012-10-25: 20:00:00 via INTRAVENOUS

## 2012-10-25 MED ORDER — POTASSIUM CHLORIDE 10 MEQ/100ML IV SOLN
10.0000 meq | Freq: Once | INTRAVENOUS | Status: AC
  Administered 2012-10-25: 10 meq via INTRAVENOUS
  Filled 2012-10-25: qty 100

## 2012-10-25 NOTE — ED Notes (Signed)
CRITICAL VALUE ALERT  Critical value received:  K+ 2.6  Date of notification:  10/25/2012  Time of notification:  2048  Critical value read back:yes  Nurse who received alert:  DRW RN  MD notified (1st page):  Dr. Deretha Emory  Time of first page:  2049  MD notified (2nd page):  Time of second page:  Responding MD:    Time MD responded:

## 2012-10-25 NOTE — ED Provider Notes (Signed)
History   This chart was scribed for Veronica Jakes, MD by Leone Payor, ED Scribe. This patient was seen in room APA04/APA04 and the patient's care was started at 1905.   CSN: [id number]  Arrival date & time 10/25/12  1847   First MD Initiated Contact with Patient 10/25/12 1905      Chief Complaint  Patient presents with  . Asthma     The history is provided by the patient. No language interpreter was used.    [name] is a 52 y.o. female brought in by ambulance, who presents to the Emergency Department complaining of new, moderate SOB and weakness starting 2 days ago and has worsened over the last 3 hours. Pt reports having breathing treatment at home with no relief. Pt has associated productive cough, vomiting (x4 today), diarrhea (x3 today), body aches, fever, chills. Per ems report, pt was given a breathing treatment and O2 placement. EMS reports pt's O2 saturation was 88% before breathing tx and O2 placement. Pt has h/o COPD, SOB. She denies receiving the flu shot this year.    Pt is a current everyday smoker and occasional alcohol user.  PCP is Dr. Loleta Chance.  Past Medical History  Diagnosis Date  . Baker's cyst   . Pulmonary lesion 1/03    on RUL onn CT   . Hemorrhoids   . Zoster 2001  . Family history of diabetes mellitus   . Tobacco user   . Other abnormal Papanicolaou smear of cervix and cervical HPV(795.09)   . Herpes zoster   . Hemorrhoids   . Baker's cyst   . Pancreatitis   . HPV (human papilloma virus) infection   . HIV disease     take Prezista,Truvada,Norvir,and Isentress daily  . Bipolar disorder     With hx of psychotic features.  . Articular cartilage disorder of knee   . Bursitis of shoulder   . Rotator cuff arthropathy   . Hypertension     takes Clonidine and Verapamil daily  . Emphysema   . Asthma     Albuterol resue inhaler and QVAR daily  . Shortness of breath     with exertion and stress  . Pneumonia     hx of;last time 1 1/25yrs ago   . Headache   . Dizziness   . Arthritis   . Joint pain   . Joint swelling   . Chronic back pain     buldging disc  . Bruises easily   . Gastric ulcer   . GERD (gastroesophageal reflux disease)     takes Nexium daily  . Chronic constipation   . Urinary frequency   . Urinary incontinence   . Nocturia   . History of blood transfusion at age 63  . Early cataracts, bilateral   . Anxiety   . Depression     takes Xanax daily and Depakote bid  . History of shingles   . 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 Right breast lumpectomy   . Total abdominal hysterectomy w/ bilateral salpingoophorectomy at age 33  . Breast surgery as a teenager    rt breast biopsy  . Cholecystectomy 10+yrs ago  . Knee arthroscopy     bilateral  . Appendectomy     with hysterectomy  . Finger surgery     rt middle finger with pin in it  . Epidural injections   . Esophagogastroduodenoscopy   .  Shoulder hemi-arthroplasty 05/06/2012    Procedure: SHOULDER HEMI-ARTHROPLASTY;  Surgeon: Eulas Post, MD;  Location: [facility] OR;  Service: Orthopedics;  Laterality: Right;    Family History  Problem Relation Age of Onset  . Diabetes      Family History  . Hypertension      Family history   . Hypertension Mother   . Diabetes Mother   . Diabetes Father   . Cancer Sister 35    x2 (liver)  . Diabetes Brother     History  Substance Use Topics  . Smoking status: Current Every Day Smoker -- 1.0 packs/day for 20 years    Types: Cigarettes  . Smokeless tobacco: Never Used  . Alcohol Use: 0.5 oz/week    1 drink(s) per week     Comment: brandy    No OB history provided.   Review of Systems  Constitutional: Positive for fever, chills and appetite change (decreased).  HENT: Positive for congestion, sore throat and neck pain.   Eyes: Negative for redness and visual disturbance.  Respiratory: Positive for cough (productive)  and shortness of breath.   Cardiovascular: Negative for chest pain.  Gastrointestinal: Positive for nausea, vomiting and diarrhea. Negative for abdominal pain.  Genitourinary: Negative for dysuria and hematuria.  Musculoskeletal: Positive for myalgias and back pain.  Skin: Negative for rash.  Neurological: Positive for headaches.  Hematological: Does not bruise/bleed easily.  All other systems reviewed and are negative.    Allergies  Aspirin and Sulfonamide derivatives  Home Medications   Current Outpatient Rx  Name  Route  Sig  Dispense  Refill  . ALBUTEROL SULFATE HFA 108 (90 BASE) MCG/ACT IN AERS   Inhalation   Inhale 2 puffs into the lungs every 4 (four) hours as needed. 2 puffs every 4-6 hours as needed for shortness of breath         . ALPRAZOLAM 1 MG PO TABS   Oral   Take 1 tablet (1 mg total) by mouth 2 (two) times daily. Take 1 tablet by mouth two times a day as needed. For anxiety   60 tablet   0   . BECLOMETHASONE DIPROPIONATE 40 MCG/ACT IN AERS   Inhalation   Inhale 2 puffs into the lungs 2 (two) times daily. 2 puffs two times a day   1 Inhaler   3   . CLONIDINE HCL 0.2 MG PO TABS   Oral   Take 0.5 tablets (0.1 mg total) by mouth at bedtime.   30 tablet   0   . DARUNAVIR ETHANOLATE 400 MG PO TABS   Oral   Take 400 mg by mouth daily.          Marland Kitchen DIAZEPAM 5 MG PO TABS   Oral   Take 5 mg by mouth every 6 (six) hours as needed. For muscle spasms/anxiety         . DICLOFENAC SODIUM 75 MG PO TBEC   Oral   Take 75 mg by mouth 2 (two) times daily.         Marland Kitchen DIPHENOXYLATE-ATROPINE 2.5-0.025 MG PO TABS   Oral   Take 1 tablet by mouth 4 (four) times daily as needed for diarrhea or loose stools. For cramping and diarrhea   30 tablet   0   . DIVALPROEX SODIUM 250 MG PO TBEC   Oral   Take 250-500 mg by mouth 2 (two) times daily. 1 in AM and 1 at HS         .  EMTRICITABINE-TENOFOVIR 200-300 MG PO TABS   Oral   Take 1 tablet by mouth daily.          Marland Kitchen ESOMEPRAZOLE MAGNESIUM 40 MG PO CPDR   Oral   Take 1 capsule (40 mg total) by mouth daily before breakfast. Take one capsule by mouth once a day   90 capsule   1   . HYDROCODONE-ACETAMINOPHEN 7.5-325 MG PO TABS   Oral   Take 1 tablet by mouth Every 4 hours as needed. *take one tablet every 4 to 6 hours as needed for pain*         . HYDROXYZINE HCL 10 MG PO TABS      One tablet at bedtime , as needed, for itching   30 tablet   0   . IMIQUIMOD 5 % EX CREA      Apply to affected area at bedtime, and wash off after 6 to 10 hours   24 each   0   . METHOCARBAMOL 750 MG PO TABS   Oral   Take 1 tablet (750 mg total) by mouth 4 (four) times daily as needed. For muscle spasms   75 tablet   1   . NASONEX 50 MCG/ACT NA SUSP   Nasal   Place 1 spray into the nose Once daily as needed. For allergies and congestion         . NYSTATIN 100000 UNIT/GM EX CREA   Topical   Apply topically 2 (two) times daily.   30 g   0   . OXYCODONE-ACETAMINOPHEN 7.5-325 MG PO TABS   Oral   Take 1 tablet by mouth every 6 (six) hours as needed. For pain         . PROMETHAZINE HCL 25 MG PO TABS   Oral   Take 1 tablet (25 mg total) by mouth every 6 (six) hours as needed.   30 tablet   1   . RALTEGRAVIR POTASSIUM 400 MG PO TABS   Oral   Take 400 mg by mouth daily.          Marland Kitchen RITONAVIR 100 MG PO CAPS   Oral   Take 100 mg by mouth 2 (two) times daily.         Marland Kitchen TAMSULOSIN HCL 0.4 MG PO CAPS   Oral   Take 1 capsule (0.4 mg total) by mouth 2 (two) times daily.   10 capsule   0   . TRAMADOL HCL 50 MG PO TABS   Oral   Take 100 mg by mouth every 6 (six) hours as needed.         Marland Kitchen VERAPAMIL HCL ER 240 MG PO TBCR   Oral   Take 1 tablet (240 mg total) by mouth daily.   30 tablet   1   . VITAMIN D (ERGOCALCIFEROL) 50000 UNITS PO CAPS   Oral   Take 50,000 Units by mouth every 7 (seven) days. Take on mondays         . VITAMIN B-12 IJ   Injection   Inject as directed every  30 (thirty) days.           BP 156/114  Pulse 144  Temp 98.2 F (36.8 C) (Oral)  Resp 25  SpO2 96%  Physical Exam  Nursing note and vitals reviewed. Constitutional: She is oriented to person, place, and time. She appears well-developed and well-nourished. No distress.  HENT:  Head: Normocephalic and atraumatic.  Mouth/Throat: Oropharynx is clear and moist.  Eyes: EOM are normal. No scleral icterus.  Neck: Neck supple. No tracheal deviation present.  Cardiovascular: Regular rhythm and normal heart sounds.  Tachycardia present.   No murmur heard.      Heart rate is 120.   Pulmonary/Chest: Effort normal and breath sounds normal. No respiratory distress. She has no wheezes.       99% O2 sat on 2 liters of O2.   No rhonchi.   Abdominal: Soft. Bowel sounds are normal. There is no tenderness.  Musculoskeletal: Normal range of motion. She exhibits no edema.  Neurological: She is alert and oriented to person, place, and time. No cranial nerve deficit. She exhibits normal muscle tone. Coordination normal.       Pt able to move both sets of fingers and toes   Skin: Skin is warm and dry.  Psychiatric: She has a normal mood and affect. Her behavior is normal.    ED Course  Procedures (including critical care time) DIAGNOSTIC STUDIES: Oxygen Saturation is 96% on room air, adequate by my interpretation.    COORDINATION OF CARE:  7:52 PM Discussed treatment plan which includes CXR, CBC panel, UA with pt at bedside and pt agreed to plan.     Labs Reviewed  CBC WITH DIFFERENTIAL - Abnormal; Notable for the following:    Neutrophils Relative 41 (*)     Lymphocytes Relative 49 (*)     All other components within normal limits  BASIC METABOLIC PANEL - Abnormal; Notable for the following:    Sodium 134 (*)     Potassium 2.6 (*)     Glucose, Bld 115 (*)     All other components within normal limits  URINALYSIS, ROUTINE W REFLEX MICROSCOPIC  INFLUENZA PANEL BY PCR   Dg Chest 2  View  10/25/2012  *RADIOLOGY REPORT*  Clinical Data: Shortness of breath and weakness.  CHEST - 2 VIEW  Comparison: 05/05/2012.  Findings: Poor inspiration.  Grossly normal sized heart.  Minimal bibasilar atelectasis.  Otherwise, clear lungs.  Interval right humeral head prosthesis.  Previously noted changes of avascular necrosis involving the left humeral head.  IMPRESSION: Poor inspiration with minimal bibasilar atelectasis.   Original Report Authenticated By: Beckie Salts, M.D.      Date: 10/25/2012  Rate: 119  Rhythm: sinus tachycardia  QRS Axis: right  Intervals: normal  ST/T Wave abnormalities: nonspecific ST/T changes  Conduction Disutrbances:none  Narrative Interpretation:   Old EKG Reviewed: none available  Results for orders placed during the hospital encounter of 10/25/12  CBC WITH DIFFERENTIAL      Component Value Range   WBC 5.4  4.0 - 10.5 K/uL   RBC 5.08  3.87 - 5.11 MIL/uL   Hemoglobin 14.6  12.0 - 15.0 g/dL   HCT 08.6  57.8 - 46.9 %   MCV 87.0  78.0 - 100.0 fL   MCH 28.7  26.0 - 34.0 pg   MCHC 33.0  30.0 - 36.0 g/dL   RDW 62.9  52.8 - 41.3 %   Platelets 201  150 - 400 K/uL   Neutrophils Relative 41 (*) 43 - 77 %   Neutro Abs 2.2  1.7 - 7.7 K/uL   Lymphocytes Relative 49 (*) 12 - 46 %   Lymphs Abs 2.7  0.7 - 4.0 K/uL   Monocytes Relative 10  3 - 12 %   Monocytes Absolute 0.5  0.1 - 1.0 K/uL   Eosinophils Relative 0  0 - 5 %   Eosinophils Absolute 0.0  0.0 - 0.7 K/uL   Basophils Relative 0  0 - 1 %   Basophils Absolute 0.0  0.0 - 0.1 K/uL  BASIC METABOLIC PANEL      Component Value Range   Sodium 134 (*) 135 - 145 mEq/L   Potassium 2.6 (*) 3.5 - 5.1 mEq/L   Chloride 96  96 - 112 mEq/L   CO2 25  19 - 32 mEq/L   Glucose, Bld 115 (*) 70 - 99 mg/dL   BUN 12  6 - 23 mg/dL   Creatinine, Ser 1.61  0.50 - 1.10 mg/dL   Calcium 9.6  8.4 - 09.6 mg/dL   GFR calc non Af Amer >90  >90 mL/min   GFR calc Af Amer >90  >90 mL/min     1. COPD exacerbation   2. Influenza    3. Hypokalemia      CRITICAL CARE Performed by: [name].   Total critical care time: 30  Critical care time was exclusive of separately billable procedures and treating other patients.  Critical care was necessary to treat or prevent imminent or life-threatening deterioration.  Critical care was time spent personally by me on the following activities: development of treatment plan with patient and/or surrogate as well as nursing, discussions with consultants, evaluation of patient's response to treatment, examination of patient, obtaining history from patient or surrogate, ordering and performing treatments and interventions, ordering and review of laboratory studies, ordering and review of radiographic studies, pulse oximetry and re-evaluation of patient's condition.  MDM   Patient with respiratory distress in route saturations were 88% on room air. Patient improved with breathing treatment and oxygen placement 2 L by EMS. Patient's symptoms seem to be consistent with the influenza. Markedly hypokalemic and here required IV piggyback potassium x2. Patient without wheezing here chest x-ray negative for pneumonia. Patient receives IM Medrol for the COPD exacerbation. Discussed with hospitalist they will mid to telemetry suspect influenza was exacerbation of her COPD. Patient oxygen was removed at one point she did remain her sats above 90% however she is persistently felt short of breath and feels better with oxygen on.      I personally performed the services described in this documentation, which was scribed in my presence. The recorded information has been reviewed and is accurate.     Veronica Jakes, MD 10/25/12 [phone]

## 2012-10-25 NOTE — ED Notes (Signed)
Pt c/o sob and weakness. Pt states she has been sob for 2 days and has gotten worse over the last 3 hours. Pt states she has had a breathing tx at home with no relief. ems reports pt O2 sat was 88% before breathing tx and O2 placement. Pt now sat 99% on 2 ltrs

## 2012-10-26 ENCOUNTER — Encounter (HOSPITAL_COMMUNITY): Payer: Self-pay | Admitting: Internal Medicine

## 2012-10-26 DIAGNOSIS — Z720 Tobacco use: Secondary | ICD-10-CM

## 2012-10-26 DIAGNOSIS — R7309 Other abnormal glucose: Secondary | ICD-10-CM

## 2012-10-26 DIAGNOSIS — J209 Acute bronchitis, unspecified: Secondary | ICD-10-CM | POA: Diagnosis present

## 2012-10-26 DIAGNOSIS — M8700 Idiopathic aseptic necrosis of unspecified bone: Secondary | ICD-10-CM | POA: Diagnosis present

## 2012-10-26 DIAGNOSIS — D72819 Decreased white blood cell count, unspecified: Secondary | ICD-10-CM | POA: Diagnosis not present

## 2012-10-26 DIAGNOSIS — R739 Hyperglycemia, unspecified: Secondary | ICD-10-CM | POA: Diagnosis present

## 2012-10-26 DIAGNOSIS — F17200 Nicotine dependence, unspecified, uncomplicated: Secondary | ICD-10-CM | POA: Diagnosis present

## 2012-10-26 DIAGNOSIS — R6889 Other general symptoms and signs: Secondary | ICD-10-CM | POA: Diagnosis present

## 2012-10-26 HISTORY — DX: Tobacco use: Z72.0

## 2012-10-26 LAB — CBC
HCT: 35.2 % — ABNORMAL LOW (ref 36.0–46.0)
Hemoglobin: 11.7 g/dL — ABNORMAL LOW (ref 12.0–15.0)
MCH: 29.4 pg (ref 26.0–34.0)
MCV: 88.4 fL (ref 78.0–100.0)
Platelets: 168 10*3/uL (ref 150–400)
RBC: 3.98 MIL/uL (ref 3.87–5.11)
WBC: 2.7 10*3/uL — ABNORMAL LOW (ref 4.0–10.5)

## 2012-10-26 LAB — COMPREHENSIVE METABOLIC PANEL
AST: 48 U/L — ABNORMAL HIGH (ref 0–37)
Albumin: 3.4 g/dL — ABNORMAL LOW (ref 3.5–5.2)
BUN: 7 mg/dL (ref 6–23)
Calcium: 8.3 mg/dL — ABNORMAL LOW (ref 8.4–10.5)
Chloride: 102 mEq/L (ref 96–112)
Creatinine, Ser: 0.53 mg/dL (ref 0.50–1.10)
Total Protein: 7.4 g/dL (ref 6.0–8.3)

## 2012-10-26 LAB — GLUCOSE, CAPILLARY: Glucose-Capillary: 156 mg/dL — ABNORMAL HIGH (ref 70–99)

## 2012-10-26 LAB — INFLUENZA PANEL BY PCR (TYPE A & B): Influenza A By PCR: NEGATIVE

## 2012-10-26 MED ORDER — ACETAMINOPHEN 325 MG PO TABS: 650.0000 mg | ORAL_TABLET | Freq: Four times a day (QID) | ORAL | Status: DC | PRN

## 2012-10-26 MED ORDER — ONDANSETRON HCL 4 MG PO TABS
4.0000 mg | ORAL_TABLET | Freq: Four times a day (QID) | ORAL | Status: DC | PRN
  Filled 2012-10-26: qty 1

## 2012-10-26 MED ORDER — RALTEGRAVIR POTASSIUM 400 MG PO TABS
400.0000 mg | ORAL_TABLET | Freq: Every day | ORAL | Status: DC
  Administered 2012-10-26 – 2012-10-27 (×2): 400 mg via ORAL
  Filled 2012-10-26 (×4): qty 1

## 2012-10-26 MED ORDER — THIAMINE HCL 100 MG/ML IJ SOLN
100.0000 mg | Freq: Every day | INTRAMUSCULAR | Status: DC
  Filled 2012-10-26: qty 2

## 2012-10-26 MED ORDER — ONDANSETRON HCL 4 MG/2ML IJ SOLN
4.0000 mg | Freq: Four times a day (QID) | INTRAMUSCULAR | Status: DC | PRN
  Administered 2012-10-26: 4 mg via INTRAVENOUS
  Filled 2012-10-26: qty 2

## 2012-10-26 MED ORDER — INSULIN ASPART 100 UNIT/ML ~~LOC~~ SOLN
0.0000 [IU] | Freq: Three times a day (TID) | SUBCUTANEOUS | Status: DC
  Administered 2012-10-26: 2 [IU] via SUBCUTANEOUS
  Administered 2012-10-26: 3 [IU] via SUBCUTANEOUS

## 2012-10-26 MED ORDER — LORAZEPAM 1 MG PO TABS
1.0000 mg | ORAL_TABLET | Freq: Four times a day (QID) | ORAL | Status: DC | PRN
  Administered 2012-10-26 (×2): 1 mg via ORAL
  Filled 2012-10-26 (×2): qty 1

## 2012-10-26 MED ORDER — PANTOPRAZOLE SODIUM 40 MG PO TBEC
40.0000 mg | DELAYED_RELEASE_TABLET | Freq: Every day | ORAL | Status: DC
  Administered 2012-10-26 – 2012-10-27 (×2): 40 mg via ORAL
  Filled 2012-10-26 (×2): qty 1

## 2012-10-26 MED ORDER — FLUTICASONE PROPIONATE HFA 44 MCG/ACT IN AERO
1.0000 | INHALATION_SPRAY | Freq: Two times a day (BID) | RESPIRATORY_TRACT | Status: DC
  Administered 2012-10-26 – 2012-10-27 (×4): 1 via RESPIRATORY_TRACT
  Filled 2012-10-26: qty 10.6

## 2012-10-26 MED ORDER — POTASSIUM CHLORIDE CRYS ER 20 MEQ PO TBCR
40.0000 meq | EXTENDED_RELEASE_TABLET | Freq: Once | ORAL | Status: AC
  Administered 2012-10-26: 40 meq via ORAL
  Filled 2012-10-26: qty 2

## 2012-10-26 MED ORDER — ACETAMINOPHEN 650 MG RE SUPP: 650.0000 mg | Freq: Four times a day (QID) | RECTAL | Status: DC | PRN

## 2012-10-26 MED ORDER — CLONIDINE HCL 0.1 MG PO TABS
0.1000 mg | ORAL_TABLET | Freq: Every day | ORAL | Status: DC
  Administered 2012-10-26 (×2): 0.1 mg via ORAL
  Filled 2012-10-26 (×2): qty 1

## 2012-10-26 MED ORDER — DIVALPROEX SODIUM 250 MG PO DR TAB
250.0000 mg | DELAYED_RELEASE_TABLET | Freq: Two times a day (BID) | ORAL | Status: DC
  Administered 2012-10-26 – 2012-10-27 (×4): 250 mg via ORAL
  Filled 2012-10-26 (×4): qty 1

## 2012-10-26 MED ORDER — VERAPAMIL HCL ER 240 MG PO TBCR
240.0000 mg | EXTENDED_RELEASE_TABLET | Freq: Every day | ORAL | Status: DC
  Administered 2012-10-26 – 2012-10-27 (×2): 240 mg via ORAL
  Filled 2012-10-26 (×2): qty 1

## 2012-10-26 MED ORDER — LEVOFLOXACIN IN D5W 500 MG/100ML IV SOLN
INTRAVENOUS | Status: AC
  Filled 2012-10-26: qty 100

## 2012-10-26 MED ORDER — LEVOFLOXACIN IN D5W 500 MG/100ML IV SOLN
500.0000 mg | INTRAVENOUS | Status: DC
  Administered 2012-10-26 – 2012-10-27 (×2): 500 mg via INTRAVENOUS
  Filled 2012-10-26 (×2): qty 100

## 2012-10-26 MED ORDER — LORAZEPAM 1 MG PO TABS
0.0000 mg | ORAL_TABLET | Freq: Four times a day (QID) | ORAL | Status: DC
  Administered 2012-10-26 – 2012-10-27 (×3): 1 mg via ORAL
  Filled 2012-10-26 (×3): qty 1

## 2012-10-26 MED ORDER — MAGNESIUM SULFATE 50 % IJ SOLN: 2.0000 g | Freq: Once | INTRAVENOUS | Status: DC

## 2012-10-26 MED ORDER — ALBUTEROL SULFATE (5 MG/ML) 0.5% IN NEBU
2.5000 mg | INHALATION_SOLUTION | Freq: Four times a day (QID) | RESPIRATORY_TRACT | Status: DC
  Administered 2012-10-26 – 2012-10-27 (×6): 2.5 mg via RESPIRATORY_TRACT
  Filled 2012-10-26 (×6): qty 0.5

## 2012-10-26 MED ORDER — MAGNESIUM SULFATE 40 MG/ML IJ SOLN
2.0000 g | Freq: Once | INTRAMUSCULAR | Status: AC
  Administered 2012-10-26: 2 g via INTRAVENOUS
  Filled 2012-10-26: qty 50

## 2012-10-26 MED ORDER — METHOCARBAMOL 500 MG PO TABS
750.0000 mg | ORAL_TABLET | Freq: Four times a day (QID) | ORAL | Status: DC | PRN
  Administered 2012-10-26 – 2012-10-27 (×3): 750 mg via ORAL
  Filled 2012-10-26 (×3): qty 2

## 2012-10-26 MED ORDER — ENOXAPARIN SODIUM 40 MG/0.4ML ~~LOC~~ SOLN
40.0000 mg | SUBCUTANEOUS | Status: DC
  Administered 2012-10-26 – 2012-10-27 (×2): 40 mg via SUBCUTANEOUS
  Filled 2012-10-26 (×2): qty 0.4

## 2012-10-26 MED ORDER — LORAZEPAM 2 MG/ML IJ SOLN: 1.0000 mg | Freq: Four times a day (QID) | INTRAMUSCULAR | Status: DC | PRN

## 2012-10-26 MED ORDER — PREDNISONE 20 MG PO TABS
60.0000 mg | ORAL_TABLET | Freq: Every day | ORAL | Status: DC
  Administered 2012-10-26 – 2012-10-27 (×2): 60 mg via ORAL
  Filled 2012-10-26 (×2): qty 3

## 2012-10-26 MED ORDER — TAMSULOSIN HCL 0.4 MG PO CAPS
0.4000 mg | ORAL_CAPSULE | Freq: Two times a day (BID) | ORAL | Status: DC
  Administered 2012-10-26 – 2012-10-27 (×3): 0.4 mg via ORAL
  Filled 2012-10-26 (×3): qty 1

## 2012-10-26 MED ORDER — SODIUM CHLORIDE 0.9 % IJ SOLN
3.0000 mL | Freq: Two times a day (BID) | INTRAMUSCULAR | Status: DC
  Administered 2012-10-27: 3 mL via INTRAVENOUS

## 2012-10-26 MED ORDER — HYDROCODONE-ACETAMINOPHEN 7.5-325 MG PO TABS
1.0000 | ORAL_TABLET | ORAL | Status: DC | PRN
  Administered 2012-10-26: 1 via ORAL
  Filled 2012-10-26: qty 1

## 2012-10-26 MED ORDER — RITONAVIR 100 MG PO CAPS
100.0000 mg | ORAL_CAPSULE | Freq: Two times a day (BID) | ORAL | Status: DC
  Administered 2012-10-26 – 2012-10-27 (×3): 100 mg via ORAL
  Filled 2012-10-26 (×8): qty 1

## 2012-10-26 MED ORDER — LORAZEPAM 1 MG PO TABS: 0.0000 mg | ORAL_TABLET | Freq: Two times a day (BID) | ORAL | Status: DC

## 2012-10-26 MED ORDER — ALBUTEROL SULFATE (5 MG/ML) 0.5% IN NEBU: 2.5000 mg | INHALATION_SOLUTION | RESPIRATORY_TRACT | Status: DC | PRN

## 2012-10-26 MED ORDER — POTASSIUM CHLORIDE IN NACL 20-0.9 MEQ/L-% IV SOLN
INTRAVENOUS | Status: DC
  Administered 2012-10-26 (×2): via INTRAVENOUS

## 2012-10-26 MED ORDER — NICOTINE 14 MG/24HR TD PT24
14.0000 mg | MEDICATED_PATCH | Freq: Every day | TRANSDERMAL | Status: DC
  Administered 2012-10-26 – 2012-10-27 (×2): 14 mg via TRANSDERMAL
  Filled 2012-10-26 (×2): qty 1

## 2012-10-26 MED ORDER — EMTRICITABINE-TENOFOVIR DF 200-300 MG PO TABS
1.0000 | ORAL_TABLET | Freq: Every day | ORAL | Status: DC
  Administered 2012-10-26 – 2012-10-27 (×2): 1 via ORAL
  Filled 2012-10-26 (×4): qty 1

## 2012-10-26 MED ORDER — HYDRALAZINE HCL 20 MG/ML IJ SOLN: 10.0000 mg | Freq: Four times a day (QID) | INTRAMUSCULAR | Status: DC | PRN

## 2012-10-26 MED ORDER — OXYCODONE-ACETAMINOPHEN 5-325 MG PO TABS
1.0000 | ORAL_TABLET | ORAL | Status: DC | PRN
  Administered 2012-10-26 – 2012-10-27 (×3): 1 via ORAL
  Administered 2012-10-27: 2 via ORAL
  Filled 2012-10-26 (×2): qty 1
  Filled 2012-10-26: qty 2
  Filled 2012-10-26: qty 1

## 2012-10-26 MED ORDER — OSELTAMIVIR PHOSPHATE 75 MG PO CAPS
75.0000 mg | ORAL_CAPSULE | Freq: Two times a day (BID) | ORAL | Status: DC
  Administered 2012-10-26 (×2): 75 mg via ORAL
  Filled 2012-10-26 (×2): qty 1

## 2012-10-26 MED ORDER — BENZONATATE 100 MG PO CAPS
100.0000 mg | ORAL_CAPSULE | Freq: Three times a day (TID) | ORAL | Status: DC
  Administered 2012-10-26 – 2012-10-27 (×4): 100 mg via ORAL
  Filled 2012-10-26 (×5): qty 1

## 2012-10-26 MED ORDER — FOLIC ACID 1 MG PO TABS
1.0000 mg | ORAL_TABLET | Freq: Every day | ORAL | Status: DC
  Administered 2012-10-26 – 2012-10-27 (×2): 1 mg via ORAL
  Filled 2012-10-26 (×2): qty 1

## 2012-10-26 MED ORDER — VITAMIN B-1 100 MG PO TABS
100.0000 mg | ORAL_TABLET | Freq: Every day | ORAL | Status: DC
  Administered 2012-10-26 – 2012-10-27 (×2): 100 mg via ORAL
  Filled 2012-10-26 (×2): qty 1

## 2012-10-26 MED ORDER — DARUNAVIR ETHANOLATE 800 MG PO TABS
400.0000 mg | ORAL_TABLET | Freq: Every day | ORAL | Status: DC
  Administered 2012-10-26: 400 mg via ORAL
  Filled 2012-10-26 (×4): qty 1

## 2012-10-26 MED ORDER — ADULT MULTIVITAMIN W/MINERALS CH
1.0000 | ORAL_TABLET | Freq: Every day | ORAL | Status: DC
  Administered 2012-10-26 – 2012-10-27 (×2): 1 via ORAL
  Filled 2012-10-26 (×2): qty 1

## 2012-10-26 MED ORDER — HYDROCOD POLST-CHLORPHEN POLST 10-8 MG/5ML PO LQCR: 5.0000 mL | Freq: Two times a day (BID) | ORAL | Status: DC | PRN

## 2012-10-26 NOTE — Progress Notes (Signed)
Subjective: The patient complains that she did not get much sleep last night. She complains of right shoulder pain. She says that her shortness of breath and cough are about the same.  Objective: Vital signs in last 24 hours: Filed Vitals:   10/26/12 0002 10/26/12 0136 10/26/12 0248 10/26/12 0831  BP: 173/106 146/111    Pulse: 97 97    Temp:  98 F (36.7 C)    TempSrc:  Oral    Resp: 20 20    Height:  5\' 5"  (1.651 m)    Weight:  81.466 kg (179 lb 9.6 oz)    SpO2: 100% 100% 98% 99%    Intake/Output Summary (Last 24 hours) at 10/26/12 1029 Last data filed at 10/26/12 0004  Gross per 24 hour  Intake      0 ml  Output      1 ml  Net     -1 ml    Weight change:   Physical exam: General: 52 year old overweight African-American woman, laying in bed, in no acute distress. Lungs: Occasional wheezes and crackles. Heart: S1, S2, with a soft systolic murmur. Abdomen: Positive bowel sounds, soft, nontender, nondistended. Extremities: No pedal edema. Neurologic: She is alert and oriented x3. Psychiatric: She has a flat affect. Her speech is clear and not pressured.  Lab Results: Basic Metabolic Panel:  Basename 10/26/12 0530 10/25/12 2012  NA 134* 134*  K 4.2 2.6*  CL 102 96  CO2 22 25  GLUCOSE 156* 115*  BUN 7 12  CREATININE 0.53 0.68  CALCIUM 8.3* 9.6  MG -- 1.4*  PHOS -- --   Liver Function Tests:  Basename 10/26/12 0530  AST 48*  ALT 37*  ALKPHOS 211*  BILITOT 0.4  PROT 7.4  ALBUMIN 3.4*   No results found for this basename: LIPASE:2,AMYLASE:2 in the last 72 hours No results found for this basename: AMMONIA:2 in the last 72 hours CBC:  Basename 10/26/12 0530 10/25/12 2012  WBC 2.7* 5.4  NEUTROABS -- 2.2  HGB 11.7* 14.6  HCT 35.2* 44.2  MCV 88.4 87.0  PLT 168 201   Cardiac Enzymes: No results found for this basename: CKTOTAL:3,CKMB:3,CKMBINDEX:3,TROPONINI:3 in the last 72 hours BNP: No results found for this basename: PROBNP:3 in the last 72  hours D-Dimer: No results found for this basename: DDIMER:2 in the last 72 hours CBG: No results found for this basename: GLUCAP:6 in the last 72 hours Hemoglobin A1C: No results found for this basename: HGBA1C in the last 72 hours Fasting Lipid Panel: No results found for this basename: CHOL,HDL,LDLCALC,TRIG,CHOLHDL,LDLDIRECT in the last 72 hours Thyroid Function Tests: No results found for this basename: TSH,T4TOTAL,FREET4,T3FREE,THYROIDAB in the last 72 hours Anemia Panel: No results found for this basename: VITAMINB12,FOLATE,FERRITIN,TIBC,IRON,RETICCTPCT in the last 72 hours Coagulation: No results found for this basename: LABPROT:2,INR:2 in the last 72 hours Urine Drug Screen: Drugs of Abuse     Component Value Date/Time   LABOPIA NONE DETECTED 05/07/2012 1714   COCAINSCRNUR NONE DETECTED 05/07/2012 1714   COCAINSCRNUR NEG 05/10/2010 0204   LABBENZ POSITIVE* 05/07/2012 1714   LABBENZ POS* 05/10/2010 0204   AMPHETMU NONE DETECTED 05/07/2012 1714   AMPHETMU NEG 05/10/2010 0204   THCU NONE DETECTED 05/07/2012 1714   LABBARB NONE DETECTED 05/07/2012 1714    Alcohol Level: No results found for this basename: ETH:2 in the last 72 hours Urinalysis: No results found for this basename: COLORURINE:2,APPERANCEUR:2,LABSPEC:2,PHURINE:2,GLUCOSEU:2,HGBUR:2,BILIRUBINUR:2,KETONESUR:2,PROTEINUR:2,UROBILINOGEN:2,NITRITE:2,LEUKOCYTESUR:2 in the last 72 hours Misc. Labs:   Micro: No results found for this or  any previous visit (from the past 240 hour(s)).  Studies/Results: Dg Chest 2 View  10/25/2012  *RADIOLOGY REPORT*  Clinical Data: Shortness of breath and weakness.  CHEST - 2 VIEW  Comparison: 05/05/2012.  Findings: Poor inspiration.  Grossly normal sized heart.  Minimal bibasilar atelectasis.  Otherwise, clear lungs.  Interval right humeral head prosthesis.  Previously noted changes of avascular necrosis involving the left humeral head.  IMPRESSION: Poor inspiration with minimal bibasilar  atelectasis.   Original Report Authenticated By: Beckie Salts, M.D.     Medications:  Scheduled:    . albuterol  2.5 mg Nebulization Q6H  . benzonatate  100 mg Oral TID  . cloNIDine  0.1 mg Oral QHS  . Darunavir Ethanolate  400 mg Oral Q breakfast  . divalproex  250 mg Oral BID  . emtricitabine-tenofovir  1 tablet Oral Q breakfast  . enoxaparin (LOVENOX) injection  40 mg Subcutaneous Q24H  . fluticasone  1 puff Inhalation BID  . folic acid  1 mg Oral Daily  . levofloxacin (LEVAQUIN) IV  500 mg Intravenous Q24H  . LORazepam  0-4 mg Oral Q6H   Followed by  . LORazepam  0-4 mg Oral Q12H  . magnesium sulfate  2 g Intravenous Once  . multivitamin with minerals  1 tablet Oral Daily  . nicotine  14 mg Transdermal Daily  . oseltamivir  75 mg Oral BID  . predniSONE  60 mg Oral Q breakfast  . raltegravir  400 mg Oral Daily  . ritonavir  100 mg Oral BID  . sodium chloride  3 mL Intravenous Q12H  . Tamsulosin HCl  0.4 mg Oral BID  . thiamine  100 mg Oral Daily   Or  . thiamine  100 mg Intravenous Daily  . verapamil  240 mg Oral Daily   Continuous:    . 0.9 % NaCl with KCl 20 mEq / L 100 mL/hr at 10/26/12 0206   DGU:[id number], acetaminophen, albuterol, chlorpheniramine-HYDROcodone, hydrALAZINE, LORazepam, LORazepam, methocarbamol, ondansetron (ZOFRAN) IV, ondansetron, oxyCODONE-acetaminophen  Assessment: Principal Problem:  *Acute bronchitis Active Problems:  Human immunodeficiency virus (HIV) disease  Unspecified essential hypertension  Hypokalemia  HTN (hypertension)  Elevated LFTs  Flu-like symptoms  Hypomagnesemia  Avascular necrosis  Leukopenia   1. Acute bronchitis. Her influenza PCR panel is negative. We'll continue Levaquin, bronchodilators, and prednisone. Will discontinue Tamiflu. Tussionex and Tessalon Perles were added for symptomatic relief of cough.  HIV infection/disease. We'll continue anti-retroviral medications.  Hypokalemia. Concomitant  hypomagnesemia. Continue repletion of potassium chloride orally. We'll give IV magnesium sulfate.  Hypertension. We'll continue clonidine and verapamil. We'll decrease IV fluids as her blood pressures trending up.  Leukopenia. Her white blood cell count has fallen since admission. This may be secondary to the dilutional effects of the IV fluids concomitant with HIV disease. This will be followed.  Chronic right shoulder pain, secondary to avascular necrosis/arthropathy. She is status post arthroscopic surgery in July 2013. We'll continue as needed opiate analgesics.  Alcohol abuse. Ativan protocol has been started. No signs of alcohol withdrawal syndrome.  Tobacco abuse. The nicotine patch has been placed. Tobacco cessation counseling will be ordered.  Steroid-induced hyperglycemia. We'll start sliding scale NovoLog.   Plan:  1. We'll decrease the rate of the IV fluids, but we'll continue gentle hydration. 2. Tobacco cessation counseling. Social work consult for alcohol use/abuse. 3. Sliding scale NovoLog for treatment of steroid-induced hyperglycemia. 4. Discontinue Tamiflu. 5. Give 2 g of magnesium sulfate IV. Continue potassium chloride supplementation as needed.  LOS: 1 day   Gleen Ripberger 10/26/2012, 10:29 AM

## 2012-10-26 NOTE — H&P (Signed)
[facility] History and Physical  ZAREA DIESING WGN:[id number] DOB: 18-Apr-1961 DOA: 10/25/2012   PCP: She is in the process of changing from Dr. Lodema Hong to Dr. Mirna Mires Specialists: She is followed by Dr. Ninetta Lights with the ID clinic for her HIV  Chief Complaint:  Feeling sick for 2 days  HPI: LADONNA VANORDER is a 52 y.o. female with a past medical history of HIV, hypertension, COPD, who was in her usual state of health about 2 days ago, when she started having nausea, vomiting. She had fever and chills. However, did not check her temperature. She felt short of breath. She had a raspy cough. She was coughing up yellowish sputum. She also had a few episodes of diarrhea. Has slight abdominal pain, but not severe. She also had  generalized body aches. She denies taking flu shot this year. Has been wheezing as well. Denies any sick contacts. She does feel sore in her chest from all the coughing.  Home Medications: Prior to Admission medications   Medication Sig Start Date End Date Taking? Authorizing Provider  albuterol (PROVENTIL HFA;VENTOLIN HFA) 108 (90 BASE) MCG/ACT inhaler Inhale 2 puffs into the lungs every 4 (four) hours as needed. 2 puffs every 4-6 hours as needed for shortness of breath 12/31/11  Yes Kerri Perches, MD  ALPRAZolam Prudy Feeler) 1 MG tablet Take 1 tablet (1 mg total) by mouth 2 (two) times daily. Take 1 tablet by mouth two times a day as needed. For anxiety 07/22/12  Yes Kerri Perches, MD  beclomethasone (QVAR) 40 MCG/ACT inhaler Inhale 2 puffs into the lungs 2 (two) times daily. 2 puffs two times a day 12/31/11  Yes Kerri Perches, MD  cloNIDine (CATAPRES) 0.2 MG tablet Take 0.5 tablets (0.1 mg total) by mouth at bedtime. 09/04/12  Yes Ginnie Smart, MD  darunavir (PREZISTA) 400 MG tablet Take 400 mg by mouth daily.    Yes Historical Provider, MD  diazepam (VALIUM) 5 MG tablet Take 5 mg by mouth every 6 (six) hours as needed. For muscle spasms/anxiety    Yes Historical Provider, MD  diclofenac (VOLTAREN) 75 MG EC tablet Take 75 mg by mouth 2 (two) times daily.   Yes Historical Provider, MD  diphenoxylate-atropine (LOMOTIL) 2.5-0.025 MG per tablet Take 1 tablet by mouth 4 (four) times daily as needed for diarrhea or loose stools. For cramping and diarrhea 09/04/12  Yes Ginnie Smart, MD  divalproex (DEPAKOTE) 250 MG DR tablet Take 250-500 mg by mouth 2 (two) times daily. 1 in AM and 1 at [facility] 08/28/11  Yes Cleotis Nipper, MD  emtricitabine-tenofovir (TRUVADA) 200-300 MG per tablet Take 1 tablet by mouth daily.   Yes Historical Provider, MD  esomeprazole (NEXIUM) 40 MG capsule Take 1 capsule (40 mg total) by mouth daily before breakfast. Take one capsule by mouth once a day 05/02/11  Yes Kerri Perches, MD  HYDROcodone-acetaminophen (NORCO) 7.5-325 MG per tablet Take 1 tablet by mouth Every 4 hours as needed. *take one tablet every 4 to 6 hours as needed for pain* 07/14/12  Yes Historical Provider, MD  hydrOXYzine (ATARAX/VISTARIL) 10 MG tablet One tablet at bedtime , as needed, for itching 07/08/12  Yes Kerri Perches, MD  imiquimod Mathis Dad) 5 % cream Apply to affected area at bedtime, and wash off after 6 to 10 hours 07/23/12 07/23/13 Yes Kerri Perches, MD  methocarbamol (ROBAXIN) 750 MG tablet Take 1 tablet (750 mg total) by mouth 4 (four) times daily  as needed. For muscle spasms 05/08/12  Yes Eulas Post, MD  NASONEX 50 MCG/ACT nasal spray Place 1 spray into the nose Once daily as needed. For allergies and congestion 06/25/12  Yes Historical Provider, MD  nystatin cream (MYCOSTATIN) Apply topically 2 (two) times daily. 07/23/12 07/23/13 Yes Ginnie Smart, MD  oxyCODONE-acetaminophen (PERCOCET) 7.5-325 MG per tablet Take 1 tablet by mouth every 6 (six) hours as needed. For pain   Yes Historical Provider, MD  promethazine (PHENERGAN) 25 MG tablet Take 1 tablet (25 mg total) by mouth every 6 (six) hours as needed. 10/17/12  Yes Ginnie Smart, MD  raltegravir (ISENTRESS) 400 MG tablet Take 400 mg by mouth daily.    Yes Historical Provider, MD  ritonavir (NORVIR) 100 MG capsule Take 100 mg by mouth 2 (two) times daily. 02/22/12  Yes Ginnie Smart, MD  Tamsulosin HCl (FLOMAX) 0.4 MG CAPS Take 1 capsule (0.4 mg total) by mouth 2 (two) times daily. 08/02/12  Yes Tia Oliveri, PA-C  traMADol (ULTRAM) 50 MG tablet Take 100 mg by mouth every 6 (six) hours as needed.   Yes Historical Provider, MD  verapamil (CALAN-SR) 240 MG CR tablet Take 1 tablet (240 mg total) by mouth daily. 09/04/12  Yes Ginnie Smart, MD  Vitamin D, Ergocalciferol, (DRISDOL) 50000 UNITS CAPS Take 50,000 Units by mouth every 7 (seven) days. Take on mondays   Yes Historical Provider, MD  Cyanocobalamin (VITAMIN B-12 IJ) Inject as directed every 30 (thirty) days.    Historical Provider, MD    Allergies:  Allergies  Allergen Reactions  . Aspirin Nausea And Vomiting  . Sulfonamide Derivatives Hives    Past Medical History: Past Medical History  Diagnosis Date  . Baker's cyst   . Pulmonary lesion 1/03    on RUL onn CT   . Hemorrhoids   . Zoster 2001  . Family history of diabetes mellitus   . Tobacco user   . Other abnormal Papanicolaou smear of cervix and cervical HPV(795.09)   . Herpes zoster   . Hemorrhoids   . Baker's cyst   . Pancreatitis   . HPV (human papilloma virus) infection   . HIV disease     take Prezista,Truvada,Norvir,and Isentress daily  . Bipolar disorder     With hx of psychotic features.  . Articular cartilage disorder of knee   . Bursitis of shoulder   . Rotator cuff arthropathy   . Hypertension     takes Clonidine and Verapamil daily  . Emphysema   . Asthma     Albuterol resue inhaler and QVAR daily  . Shortness of breath     with exertion and stress  . Pneumonia     hx of;last time 1 1/51yrs ago  . Headache   . Dizziness   . Arthritis   . Joint pain   . Joint swelling   . Chronic back pain     buldging disc    . Bruises easily   . Gastric ulcer   . GERD (gastroesophageal reflux disease)     takes Nexium daily  . Chronic constipation   . Urinary frequency   . Urinary incontinence   . Nocturia   . History of blood transfusion at age 53  . Early cataracts, bilateral   . Anxiety   . Depression     takes Xanax daily and Depakote bid  . History of shingles   . 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 Right breast lumpectomy   . Total abdominal hysterectomy w/ bilateral salpingoophorectomy at age 24  . Breast surgery as a teenager    rt breast biopsy  . Cholecystectomy 10+yrs ago  . Knee arthroscopy     bilateral  . Appendectomy     with hysterectomy  . Finger surgery     rt middle finger with pin in it  . Epidural injections   . Esophagogastroduodenoscopy   . Shoulder hemi-arthroplasty 05/06/2012    Procedure: SHOULDER HEMI-ARTHROPLASTY;  Surgeon: Eulas Post, MD;  Location: [facility] OR;  Service: Orthopedics;  Laterality: Right;    Social History: She smoked one pack of cigarettes on a daily basis. After multiple questions that she told me that she does drink brandy on a daily basis, half a pint. She hasn't had any drink in 2 days however. She also admitted to smoking marijuana once in a while.   Living Situation: She lives In [place] with her fianc Activity Level: Usually independent with daily activities   Family History:  Family History  Problem Relation Age of Onset  . Diabetes      Family History  . Hypertension      Family history   . Hypertension Mother   . Diabetes Mother   . Diabetes Father   . Cancer Sister 85    x2 (liver)  . Diabetes Brother      Review of Systems - History obtained from the patient General ROS: positive for  - chills, fatigue and fever Psychological ROS: positive for - anxiety Ophthalmic ROS: negative ENT ROS: negative Allergy and Immunology ROS:  negative Hematological and Lymphatic ROS: negative Endocrine ROS: negative Respiratory ROS: as in hpi Cardiovascular ROS: as in hpi Gastrointestinal ROS: no abdominal pain, change in bowel habits, or black or bloody stools Genito-Urinary ROS: no dysuria, trouble voiding, or hematuria Musculoskeletal ROS: negative Neurological ROS: no TIA or stroke symptoms Dermatological ROS: negative  Physical Examination  Filed Vitals:   10/25/12 1854 10/26/12 0002  BP: 156/114 173/106  Pulse: 144 97  Temp: 98.2 F (36.8 C)   TempSrc: Oral   Resp: 25 20  SpO2: 96% 100%    General appearance: alert, cooperative, appears stated age and no distress Head: Normocephalic, without obvious abnormality, atraumatic Eyes: conjunctivae/corneas clear. PERRL, EOM's intact.  Throat: lips, mucosa, and tongue normal; teeth and gums normal Neck: no adenopathy, no carotid bruit, no JVD, supple, symmetrical, trachea midline and thyroid not enlarged, symmetric, no tenderness/mass/nodules Back: symmetric, no curvature. ROM normal. No CVA tenderness. Resp: Good air in tree bilaterally. No wheezing heard. No crackles Cardio: S1-S2 is tachycardic. Regular. No S3, S4. No rubs, murmurs, or bruits. GI: soft, non-tender; bowel sounds normal; no masses,  no organomegaly Extremities: extremities normal, atraumatic, no cyanosis or edema Pulses: 2+ and symmetric Skin: Skin color, texture, turgor normal. No rashes or lesions Lymph nodes: Cervical, supraclavicular, and axillary nodes normal. Neurologic: She is alert and oriented x3. No focal neurological deficits are present.  Laboratory Data: Results for orders placed during the hospital encounter of 10/25/12 (from the past 48 hour(s))  CBC WITH DIFFERENTIAL     Status: Abnormal   Collection Time   10/25/12  8:12 PM      Component Value Range Comment   WBC 5.4  4.0 - 10.5 K/uL    RBC 5.08  3.87 - 5.11 MIL/uL    Hemoglobin 14.6  12.0 - 15.0 g/dL    HCT  44.2  36.0 - 46.0  %    MCV 87.0  78.0 - 100.0 fL    MCH 28.7  26.0 - 34.0 pg    MCHC 33.0  30.0 - 36.0 g/dL    RDW 09.8  11.9 - 14.7 %    Platelets 201  150 - 400 K/uL    Neutrophils Relative 41 (*) 43 - 77 %    Neutro Abs 2.2  1.7 - 7.7 K/uL    Lymphocytes Relative 49 (*) 12 - 46 %    Lymphs Abs 2.7  0.7 - 4.0 K/uL    Monocytes Relative 10  3 - 12 %    Monocytes Absolute 0.5  0.1 - 1.0 K/uL    Eosinophils Relative 0  0 - 5 %    Eosinophils Absolute 0.0  0.0 - 0.7 K/uL    Basophils Relative 0  0 - 1 %    Basophils Absolute 0.0  0.0 - 0.1 K/uL   BASIC METABOLIC PANEL     Status: Abnormal   Collection Time   10/25/12  8:12 PM      Component Value Range Comment   Sodium 134 (*) 135 - 145 mEq/L    Potassium 2.6 (*) 3.5 - 5.1 mEq/L    Chloride 96  96 - 112 mEq/L    CO2 25  19 - 32 mEq/L    Glucose, Bld 115 (*) 70 - 99 mg/dL    BUN 12  6 - 23 mg/dL    Creatinine, Ser 8.29  0.50 - 1.10 mg/dL    Calcium 9.6  8.4 - 56.2 mg/dL    GFR calc non Af Amer >90  >90 mL/min    GFR calc Af Amer >90  >90 mL/min     Radiology Reports: Dg Chest 2 View  10/25/2012  *RADIOLOGY REPORT*  Clinical Data: Shortness of breath and weakness.  CHEST - 2 VIEW  Comparison: 05/05/2012.  Findings: Poor inspiration.  Grossly normal sized heart.  Minimal bibasilar atelectasis.  Otherwise, clear lungs.  Interval right humeral head prosthesis.  Previously noted changes of avascular necrosis involving the left humeral head.  IMPRESSION: Poor inspiration with minimal bibasilar atelectasis.   Original Report Authenticated By: Beckie Salts, M.D.     Electrocardiogram: EKG shows sinus tachycardia at 120 beats per minute. Normal axis. Intervals are normal. No acute ST changes. Possible Q wave in lead 3. No acute T changes.  Problem List  Principal Problem:  *Acute bronchitis Active Problems:  Human immunodeficiency virus (HIV) disease  Unspecified essential hypertension  Hypokalemia  Flu-like symptoms   Assessment: This is a  52 year old, African American female, with a past medical history of HIV, who presents with flulike illness. She was hypoxic to 88% on room air in the emergency department. She also has a history of COPD. Influenza panel is pending. She could have acute bronchitis that appears to be mild.  Plan: #1 acute bronchitis with flulike illness: We will keep her on Tamiflu for now. We'll give her nebulizer treatments, oral steroids, and antibiotics. She should  recover quickly. IV fluids were provided as well. Room air saturations will be checked in the morning.  #2 severe hypokalemia: This will be repleted intravenously and orally. Magnesium level will be checked.  #3 alcohol abuse: Ativan protocol will be initiated. Thiamine will be provided.  #4 tobacco abuse: Nicotine patch will be ordered.  #5 history of HIV disease on anti-retroviral treatment: Continue with her home medications.  #6 hypertension: Continue  home medications along with PRN hydralazine for elevated blood pressure.  Further management decisions will depend on results of further testing and patient's response to treatment.  Code Status: She is a full code Family Communication: Discussed with the patient  Disposition Plan: Will likely return home when improved   [facility]  [facility] Pager [phone]  If 7PM-7AM, please contact night-coverage [email or web address] Password [facility]  10/26/2012, 12:52 AM

## 2012-10-27 DIAGNOSIS — D649 Anemia, unspecified: Secondary | ICD-10-CM

## 2012-10-27 LAB — GLUCOSE, CAPILLARY: Glucose-Capillary: 113 mg/dL — ABNORMAL HIGH (ref 70–99)

## 2012-10-27 LAB — CBC WITH DIFFERENTIAL/PLATELET
Basophils Relative: 0 % (ref 0–1)
Eosinophils Absolute: 0 10*3/uL (ref 0.0–0.7)
Eosinophils Relative: 0 % (ref 0–5)
HCT: 33.6 % — ABNORMAL LOW (ref 36.0–46.0)
Hemoglobin: 10.9 g/dL — ABNORMAL LOW (ref 12.0–15.0)
MCH: 28.9 pg (ref 26.0–34.0)
MCHC: 32.4 g/dL (ref 30.0–36.0)
MCV: 89.1 fL (ref 78.0–100.0)
Monocytes Absolute: 0.4 10*3/uL (ref 0.1–1.0)
Monocytes Relative: 4 % (ref 3–12)

## 2012-10-27 LAB — URINALYSIS, ROUTINE W REFLEX MICROSCOPIC
Glucose, UA: NEGATIVE mg/dL
Hgb urine dipstick: NEGATIVE
Protein, ur: NEGATIVE mg/dL

## 2012-10-27 LAB — BASIC METABOLIC PANEL
BUN: 6 mg/dL (ref 6–23)
Calcium: 8.5 mg/dL (ref 8.4–10.5)
Chloride: 104 mEq/L (ref 96–112)
Creatinine, Ser: 0.53 mg/dL (ref 0.50–1.10)
GFR calc Af Amer: >90 mL/min (ref >90)
GFR calc non Af Amer: >90 mL/min (ref >90)

## 2012-10-27 MED ORDER — ALBUTEROL SULFATE HFA 108 (90 BASE) MCG/ACT IN AERS: 2.0000 | INHALATION_SPRAY | RESPIRATORY_TRACT | Status: DC | PRN

## 2012-10-27 MED ORDER — ALBUTEROL SULFATE HFA 108 (90 BASE) MCG/ACT IN AERS
2.0000 | INHALATION_SPRAY | Freq: Four times a day (QID) | RESPIRATORY_TRACT | Status: DC
  Administered 2012-10-27: 2 via RESPIRATORY_TRACT
  Filled 2012-10-27: qty 6.7

## 2012-10-27 MED ORDER — PREDNISONE 20 MG PO TABS: 40.0000 mg | ORAL_TABLET | Freq: Every day | ORAL | Status: DC

## 2012-10-27 MED ORDER — LEVOFLOXACIN 500 MG PO TABS
500.0000 mg | ORAL_TABLET | Freq: Every day | ORAL | Status: DC
  Administered 2012-10-27: 500 mg via ORAL
  Filled 2012-10-27: qty 1

## 2012-10-27 MED ORDER — LEVOFLOXACIN 500 MG PO TABS: 500.0000 mg | ORAL_TABLET | Freq: Every day | ORAL | Status: DC

## 2012-10-27 MED ORDER — ALBUTEROL SULFATE HFA 108 (90 BASE) MCG/ACT IN AERS: 1.0000 | INHALATION_SPRAY | Freq: Four times a day (QID) | RESPIRATORY_TRACT | Status: DC | PRN

## 2012-10-27 NOTE — Clinical Social Work Note (Signed)
Pt reported to MD that she does not have heat in her home. CSW spoke with pt about this and she states it should be taken care of now. Contact information provided for heat assistance. Pt is upset that she is being d/c today, saying this was a surprise. She is more concerned with this right now than her heat.   [name], [place] [phone]

## 2012-10-27 NOTE — Care Management Note (Signed)
    Page 1 of 1   10/27/2012     1:24:36 PM   CARE MANAGEMENT NOTE 10/27/2012  Patient:  [name], [name]   Account Number:  [id number]  Date Initiated:  10/27/2012  Documentation initiated by:  Rosemary Holms  Subjective/Objective Assessment:   Pt admitted from home into observation. PTA lives with spouse. CM offered HH and pt stated no, it was all taken care of now. Thanks CM     Action/Plan:   Anticipated DC Date:  10/27/2012   Anticipated DC Plan:  HOME/SELF CARE      DC Planning Services  CM consult      Choice offered to / List presented to:             Status of service:  Completed, signed off Medicare Important Message given?   (If response is "NO", the following Medicare IM given date fields will be blank) Date Medicare IM given:   Date Additional Medicare IM given:    Discharge Disposition:  HOME/SELF CARE  Per UR Regulation:    If discussed at Long Length of Stay Meetings, dates discussed:    Comments:  10/27/12 Phil Corti Robosn RN BSN CM

## 2012-10-27 NOTE — Progress Notes (Signed)
Utilization Review Complete  

## 2012-10-27 NOTE — Clinical Social Work Psychosocial (Signed)
Clinical Social Work Department BRIEF PSYCHOSOCIAL ASSESSMENT 10/27/2012  Patient:  [name], [name]     Account Number:  [id number]     Admit date:  10/25/2012  Clinical Social Worker:  Nancie Neas  Date/Time:  10/27/2012 11:00 AM  Referred by:  Physician  Date Referred:  10/27/2012 Referred for  Substance Abuse   Other Referral:   Interview type:  Patient Other interview type:    PSYCHOSOCIAL DATA Living Status:  ALONE Admitted from facility:   Level of care:   Primary support name:  Leonette Most Primary support relationship to patient:  PARTNER Degree of support available:   ?    CURRENT CONCERNS Current Concerns  Substance Abuse   Other Concerns:    SOCIAL WORK ASSESSMENT / PLAN CSW met with pt at bedside following referral from MD for substance abuse. Pt alert and oriented and report she lives alone but has an aid from 10-3 during the week. Her fiance is her best support, although she indicates he does not do a lot besides sit with her. Her aid called EMS when she began vomiting and was very SOB. Pt addressed substance use and said she generally drinks on weekends, maybe three drinks maximum. Pt reports she drinks brandy and wine. She denies any marijuana use as documented in chart. Pt does not feel her drinking is a problem and has never been to any treatment. She thinks she can stop on her own if she chose to without treatment assistance. Pt does see Dr. Shelva Majestic and would feel comfortable discussing with him if needed. Rethinking Drinking booklet also provided. Pt reports no further needs. SBIRT completed. Pt scored an 8, indicating risky drinking.   Assessment/plan status:  Referral to [facility] Other assessment/ plan:   Information/referral to community resources:   follow up with psychologist if needed  Rethinking Drinking booklet    PATIENT'S/FAMILY'S RESPONSE TO PLAN OF CARE: Pt reports her drinking is not a problem for her and does not express any  desire during visit to cut back. CSW signing off but can be reconsulted if needed.        Derenda Fennel, [place] [phone]

## 2012-10-27 NOTE — Discharge Summary (Signed)
Physician Discharge Summary  [name] ZOX:[id number] DOB: 1961/04/03 DOA: 10/25/2012  PCP: Evlyn Courier, MD  Admit date: 10/25/2012 Discharge date: 10/27/2012  Time spent: 40 min.  Recommendations for Outpatient Follow-up:   Keep previously scheduled appointment with Dr. Ninetta Lights on 11/05/12 See your primary care physician in 1 week. Check cbc.  (Hgb dropped to 10.9 during hospital stay, but likely dilutional).  [name] is on Voltaren and Robaxin.      Discharge Diagnoses:  Principal Problem:  *Acute bronchitis Active Problems:  Human immunodeficiency virus (HIV) disease  Unspecified essential hypertension  Hypokalemia  HTN (hypertension)  Elevated LFTs  Flu-like symptoms  Hypomagnesemia  Avascular necrosis  Leukopenia  Tobacco abuse  Hyperglycemia, drug-induced  Anemia   Discharge Condition: Stable, eating, no fever, no longer vomiting, or SOB Diet recommendation: heart healthy.  Filed Weights   10/26/12 0136  Weight: 81.466 kg (179 lb 9.6 oz)    History of present illness:  [name] is a 52 year old female with past medical history of HIV, hypertension, and COPD. 2 days prior to admission [name] began having shortness of breath, fever and chills, nausea and vomiting.  [name] complained of generalized body aches. [name] was admitted for acute on chronic respiratory failure with flulike symptoms.  Hospital Course:   Acute bronchitis with flulike illness: [name] was tested for influenza and found to be negative. [name] was treated with Levaquin bronchodilators and prednisone. Tamiflu was initially started but then discontinued when her influenza panel was negative.  Her symptoms have improved significantly. [name] is no longer vomiting. [name] is able to tolerate a solid diet. [name] is afebrile without shortness of breath or wheeze.  At Veronica time of discharge we will send her home on 4 more days of Levaquin to complete a 7 day antibiotic course of therapy, and a short prednisone  taper.   Severe hypokalemia: Her potassium and magnesium were both low and were repleted intravenously. At Veronica time of discharge her potassium is 3.8.  HIV Disease.  Anti-retroviral medications were continued.  Hypertension.  Well controlled on clonidine and verapamil.  Alcohol abuse: Ativan protocol was initiated but [name] demonstrated no signs of alcohol withdrawal symptoms. Thiamine was provided.   Tobacco abuse: Nicotine patch was utilized during this admission. [name] was advised to stop smoking.  Bi-polar Disorder.  It remained stable during this admission. Medications were continued.  Normocytic anemia.  Baseline hemoglobin appears to be approximately 12. Hemoglobin at discharge was 10.9. Stool for occult blood tests were ordered but [name] did not have a bowel movement.  [name] is on both voltaren and robaxin at home. Nexium was continued.   Outpatient workup recommended as deemed appropriate by her primary care physician.    Discharge Exam: Filed Vitals:   10/27/12 0216 10/27/12 0541 10/27/12 0721 10/27/12 1042  BP:  113/75  121/76  Pulse:  80  74  Temp:  97.5 F (36.4 C)    TempSrc:  Oral    Resp:  20  20  Height:      Weight:      SpO2: 100% 100% 100% 100%    General: Sitting up, eating breakfast, no apparent distress, talkative Cardiovascular: Regular rate and rhythm, no murmurs rubs or gallops, no lower extremity edema. Respiratory: Clear consultation, no accessory muscle movement. Abdomen: Soft, nontender, nondistended, positive bowel sounds, no masses. Psychiatric: Awake, cooperative, talkative, rambles off subject.  Able to be redirected easily.  Discharge Instructions      Discharge Orders  Future Appointments: Provider: Department: Dept Phone: Center:   11/05/2012 10:15 AM Ginnie Smart, MD [facility], [facility], [facility] [phone] [facility]     Future Orders Please Complete By Expires   Diet - low sodium heart healthy       Increase activity slowly          Medication List     As of 10/27/2012 11:38 AM    STOP taking these medications         diphenoxylate-atropine 2.5-0.025 MG per tablet   Commonly known as: LOMOTIL      oxyCODONE-acetaminophen 7.5-325 MG per tablet   Commonly known as: PERCOCET      TAKE these medications         albuterol 108 (90 BASE) MCG/ACT inhaler   Commonly known as: PROVENTIL HFA;VENTOLIN HFA   Inhale 2 puffs into Veronica lungs every 4 (four) hours as needed. 2 puffs every 4-6 hours as needed for shortness of breath      ALPRAZolam 1 MG tablet   Commonly known as: XANAX   Take 1 tablet (1 mg total) by mouth 2 (two) times daily. Take 1 tablet by mouth two times a day as needed. For anxiety      beclomethasone 40 MCG/ACT inhaler   Commonly known as: QVAR   Inhale 2 puffs into Veronica lungs 2 (two) times daily. 2 puffs two times a day      cloNIDine 0.2 MG tablet   Commonly known as: CATAPRES   Take 0.5 tablets (0.1 mg total) by mouth at bedtime.      darunavir 400 MG tablet   Commonly known as: PREZISTA   Take 400 mg by mouth daily.      diazepam 5 MG tablet   Commonly known as: VALIUM   Take 5 mg by mouth every 6 (six) hours as needed. For muscle spasms/anxiety      diclofenac 75 MG EC tablet   Commonly known as: VOLTAREN   Take 75 mg by mouth 2 (two) times daily.      divalproex 250 MG DR tablet   Commonly known as: DEPAKOTE   Take 250-500 mg by mouth 2 (two) times daily. 1 in AM and 1 at HS      emtricitabine-tenofovir 200-300 MG per tablet   Commonly known as: TRUVADA   Take 1 tablet by mouth daily.      esomeprazole 40 MG capsule   Commonly known as: NEXIUM   Take 1 capsule (40 mg total) by mouth daily before breakfast. Take one capsule by mouth once a day      HYDROcodone-acetaminophen 7.5-325 MG per tablet   Commonly known as: NORCO   Take 1 tablet by mouth Every 4 hours as needed. *take one tablet every 4 to 6 hours as needed for pain*      hydrOXYzine  10 MG tablet   Commonly known as: ATARAX/VISTARIL   One tablet at bedtime , as needed, for itching      imiquimod 5 % cream   Commonly known as: ALDARA   Apply to affected area at bedtime, and wash off after 6 to 10 hours      levofloxacin 500 MG tablet   Commonly known as: LEVAQUIN   Take 1 tablet (500 mg total) by mouth daily.      methocarbamol 750 MG tablet   Commonly known as: ROBAXIN   Take 1 tablet (750 mg total) by mouth 4 (four) times daily as  needed. For muscle spasms      NASONEX 50 MCG/ACT nasal spray   Generic drug: mometasone   Place 1 spray into Veronica nose Once daily as needed. For allergies and congestion      nystatin cream   Commonly known as: MYCOSTATIN   Apply topically 2 (two) times daily.      predniSONE 20 MG tablet   Commonly known as: DELTASONE   Take 2 tablets (40 mg total) by mouth daily with breakfast. Take 2 tablets 1/7.  Take 1 1/2 tablets on 1/8  Take 1 tablet on 1/9  Take 1/2 tablet on 1/10.  Then stop.      promethazine 25 MG tablet   Commonly known as: PHENERGAN   Take 1 tablet (25 mg total) by mouth every 6 (six) hours as needed.      raltegravir 400 MG tablet   Commonly known as: ISENTRESS   Take 400 mg by mouth daily.      ritonavir 100 MG capsule   Commonly known as: NORVIR   Take 100 mg by mouth 2 (two) times daily.      Tamsulosin HCl 0.4 MG Caps   Commonly known as: FLOMAX   Take 1 capsule (0.4 mg total) by mouth 2 (two) times daily.      traMADol 50 MG tablet   Commonly known as: ULTRAM   Take 100 mg by mouth every 6 (six) hours as needed.      verapamil 240 MG CR tablet   Commonly known as: CALAN-SR   Take 1 tablet (240 mg total) by mouth daily.      VITAMIN B-12 IJ   Inject as directed every 30 (thirty) days.      Vitamin D (Ergocalciferol) 50000 UNITS Caps   Commonly known as: DRISDOL   Take 50,000 Units by mouth every 7 (seven) days. Take on mondays         Follow-up Information    Follow up with  [facility], MD. Schedule an appointment as soon as possible for a visit in 1 week.   Contact information:   [address] [place] [address] [phone]           Veronica results of significant diagnostics from this hospitalization (including imaging, microbiology, ancillary and laboratory) are listed below for reference.    Significant Diagnostic Studies: Dg Chest 2 View  10/25/2012  *RADIOLOGY REPORT*  Clinical Data: Shortness of breath and weakness.  CHEST - 2 VIEW  Comparison: 05/05/2012.  Findings: Poor inspiration.  Grossly normal sized heart.  Minimal bibasilar atelectasis.  Otherwise, clear lungs.  Interval right humeral head prosthesis.  Previously noted changes of avascular necrosis involving Veronica left humeral head.  IMPRESSION: Poor inspiration with minimal bibasilar atelectasis.   Original Report Authenticated By: Beckie Salts, M.D.      Labs: Basic Metabolic Panel:  Lab 10/27/12 2952 10/26/12 0530 10/25/12 2012  NA 137 134* 134*  K 3.8 4.2 2.6*  CL 104 102 96  CO2 21 22 25   GLUCOSE 122* 156* 115*  BUN 6 7 12   CREATININE 0.53 0.53 0.68  CALCIUM 8.5 8.3* 9.6  MG -- -- 1.4*  PHOS -- -- --   Liver Function Tests:  Lab 10/26/12 0530  AST 48*  ALT 37*  ALKPHOS 211*  BILITOT 0.4  PROT 7.4  ALBUMIN 3.4*   CBC:  Lab 10/27/12 0454 10/26/12 0530 10/25/12 2012  WBC 8.4 2.7* 5.4  NEUTROABS 5.9 -- 2.2  HGB 10.9* 11.7* 14.6  HCT 33.6* 35.2* 44.2  MCV 89.1 88.4 87.0  PLT 186 168 201   CBG:  Lab 10/27/12 0740 10/26/12 2227 10/26/12 1639 10/26/12 1132  GLUCAP 111* 113* 139* 156*       Signed:  Conley Canal [phone]  [facility] 10/27/2012, 11:38 AM    Attending: The [name] was seen and examined. [name] was discussed with PA, Ms. Elyn Peers. Note was amended and annotated. [name]'s lungs were clear at Veronica time of discharge. [name] was alert and oriented. Her serum sodium and potassium improved at Veronica time of discharge. Veronica  [name] would benefit from an outpatient evaluation by gastroenterologist, but this will be deferred to her primary care providers. [name] was restarted on her PPI. [name] was discharged on a prednisone taper and Levaquin for continued treatment of acute bronchitis.

## 2012-10-27 NOTE — Progress Notes (Signed)
Discharge instructions given to pt. With teach back given to RN, pt. Placed in W/C and taken to car. Pt's home medications given pt.

## 2012-10-27 NOTE — Progress Notes (Signed)
Pt. Off oxygen x 10 minutes, oxygen reading 100% without oxygen.

## 2012-11-05 ENCOUNTER — Encounter: Payer: Self-pay | Admitting: Infectious Diseases

## 2012-11-05 ENCOUNTER — Ambulatory Visit (INDEPENDENT_AMBULATORY_CARE_PROVIDER_SITE_OTHER): Payer: Medicare Other | Admitting: Infectious Diseases

## 2012-11-05 ENCOUNTER — Other Ambulatory Visit: Payer: Self-pay | Admitting: Infectious Diseases

## 2012-11-05 VITALS — BP 156/124 | HR 116 | Temp 97.2°F | Ht 64.0 in | Wt 175.0 lb

## 2012-11-05 DIAGNOSIS — B20 Human immunodeficiency virus [HIV] disease: Secondary | ICD-10-CM

## 2012-11-05 DIAGNOSIS — A630 Anogenital (venereal) warts: Secondary | ICD-10-CM

## 2012-11-05 DIAGNOSIS — D649 Anemia, unspecified: Secondary | ICD-10-CM

## 2012-11-05 DIAGNOSIS — F17200 Nicotine dependence, unspecified, uncomplicated: Secondary | ICD-10-CM

## 2012-11-05 DIAGNOSIS — R8789 Other abnormal findings in specimens from female genital organs: Secondary | ICD-10-CM

## 2012-11-05 DIAGNOSIS — Z720 Tobacco use: Secondary | ICD-10-CM

## 2012-11-05 DIAGNOSIS — M25569 Pain in unspecified knee: Secondary | ICD-10-CM

## 2012-11-05 DIAGNOSIS — I10 Essential (primary) hypertension: Secondary | ICD-10-CM

## 2012-11-05 DIAGNOSIS — Z23 Encounter for immunization: Secondary | ICD-10-CM

## 2012-11-05 DIAGNOSIS — Z21 Asymptomatic human immunodeficiency virus [HIV] infection status: Secondary | ICD-10-CM

## 2012-11-05 LAB — LIPID PANEL
HDL: 74 mg/dL (ref >39)
LDL Cholesterol: 134 mg/dL — ABNORMAL HIGH (ref 0–99)
Total CHOL/HDL Ratio: 3.2 Ratio
Triglycerides: 152 mg/dL — ABNORMAL HIGH (ref <150)
VLDL: 30 mg/dL (ref 0–40)

## 2012-11-05 LAB — CBC WITH DIFFERENTIAL/PLATELET
Eosinophils Absolute: 0.1 10*3/uL (ref 0.0–0.7)
Hemoglobin: 12.5 g/dL (ref 12.0–15.0)
Lymphs Abs: 3.7 10*3/uL (ref 0.7–4.0)
MCH: 28.7 pg (ref 26.0–34.0)
Monocytes Relative: 8 % (ref 3–12)
Neutro Abs: 4.7 10*3/uL (ref 1.7–7.7)
Neutrophils Relative %: 51 % (ref 43–77)
RBC: 4.35 MIL/uL (ref 3.87–5.11)

## 2012-11-05 LAB — RPR

## 2012-11-05 LAB — COMPREHENSIVE METABOLIC PANEL
Albumin: 4.2 g/dL (ref 3.5–5.2)
CO2: 23 mEq/L (ref 19–32)
Chloride: 101 mEq/L (ref 96–112)
Glucose, Bld: 84 mg/dL (ref 70–99)
Potassium: 4.2 mEq/L (ref 3.5–5.3)
Sodium: 136 mEq/L (ref 135–145)
Total Protein: 7.2 g/dL (ref 6.0–8.3)

## 2012-11-05 MED ORDER — CYANOCOBALAMIN 1000 MCG/ML IJ SOLN
1000.0000 ug | Freq: Once | INTRAMUSCULAR | Status: AC
  Administered 2012-11-05: 1000 ug via INTRAMUSCULAR

## 2012-11-05 NOTE — Assessment & Plan Note (Signed)
Taking TZV/DRVr/ISN. Appears to be doing well. Given condoms. Will get flu shot. B12 shot. See her back in 4- months. Get labs to day.

## 2012-11-05 NOTE — Assessment & Plan Note (Signed)
States she will have repeat in April

## 2012-11-05 NOTE — Assessment & Plan Note (Signed)
i encouraged her to quit smoking

## 2012-11-05 NOTE — Assessment & Plan Note (Signed)
Spoke to her about HRA, she wishes to defer.

## 2012-11-05 NOTE — Assessment & Plan Note (Signed)
In process of getting R TKR.

## 2012-11-05 NOTE — Addendum Note (Signed)
Addended by: Mariea Clonts D on: 11/05/2012 02:30 PM   Modules accepted: Orders

## 2012-11-05 NOTE — Addendum Note (Signed)
Addended by: Wendall Mola A on: 11/05/2012 10:51 AM   Modules accepted: Orders

## 2012-11-05 NOTE — Progress Notes (Signed)
  Subjective:    Patient ID: [name], female    DOB: 11-25-1960, 52 y.o.   MRN: [id number]  HPI 52 yo F with long hx of HIV+, bipolar, abn pap, anal warts. Hospitalized 1-4 to 1-6 with flu like illness at [facility]. Flu PCR was (-). CXR (10-25-12):Poor inspiration with minimal bibasilar atelectasis. Was treated with flouroquinolone/levaquin and prednisone and d/c to home.  Still feels like she is rasping in her throat. Saw her knee doctor and was told that she needed R TKR ("bone to bone").  No longer seeing Dr Lodema Hong.  Has "not seen a psychiatrist since I don't know when".     Review of Systems  Constitutional: Positive for chills. Negative for fever and unexpected weight change.  Respiratory: Positive for cough. Negative for shortness of breath.   Gastrointestinal: Negative for diarrhea and constipation.  Genitourinary: Negative for difficulty urinating.       Objective:   Physical Exam  Constitutional: She appears well-developed and well-nourished.  HENT:  Mouth/Throat: No oropharyngeal exudate.  Eyes: EOM are normal.  Neck: Neck supple.  Cardiovascular: Normal rate, regular rhythm and normal heart sounds.   Pulmonary/Chest: Effort normal and breath sounds normal.  Abdominal: Soft. Bowel sounds are normal. There is no tenderness.  Musculoskeletal: Normal range of motion. She exhibits no tenderness.  Lymphadenopathy:    She has no cervical adenopathy.          Assessment & Plan:

## 2012-11-05 NOTE — Assessment & Plan Note (Signed)
In process of getting new PCP. Will refill her anti-htn meds while waiting. Available for "surgical clearance" as needed as well.

## 2012-11-06 LAB — HIV-1 RNA QUANT-NO REFLEX-BLD: HIV 1 RNA Quant: <20 copies/mL (ref <20)

## 2012-11-06 LAB — T-HELPER CELL (CD4) - (RCID CLINIC ONLY): CD4 % Helper T Cell: 27 % — ABNORMAL LOW (ref 33–55)

## 2012-11-18 ENCOUNTER — Telehealth: Payer: Self-pay | Admitting: Family Medicine

## 2012-11-18 ENCOUNTER — Other Ambulatory Visit: Payer: Self-pay | Admitting: Family Medicine

## 2012-11-18 DIAGNOSIS — N644 Mastodynia: Secondary | ICD-10-CM

## 2012-11-19 NOTE — Telephone Encounter (Signed)
noted 

## 2012-11-19 NOTE — Telephone Encounter (Signed)
Noted  

## 2012-12-03 ENCOUNTER — Inpatient Hospital Stay (HOSPITAL_COMMUNITY): Admission: RE | Admit: 2012-12-03 | Payer: Medicare Other | Source: Ambulatory Visit

## 2012-12-15 ENCOUNTER — Telehealth: Payer: Self-pay | Admitting: *Deleted

## 2012-12-15 DIAGNOSIS — R110 Nausea: Secondary | ICD-10-CM

## 2012-12-15 DIAGNOSIS — I10 Essential (primary) hypertension: Secondary | ICD-10-CM

## 2012-12-15 MED ORDER — CLONIDINE HCL 0.2 MG PO TABS: 0.1000 mg | ORAL_TABLET | Freq: Every day | ORAL | Status: DC

## 2012-12-15 MED ORDER — PROMETHAZINE HCL 25 MG PO TABS: 25.0000 mg | ORAL_TABLET | Freq: Four times a day (QID) | ORAL | Status: DC | PRN

## 2012-12-15 MED ORDER — VERAPAMIL HCL ER 240 MG PO TBCR: 240.0000 mg | EXTENDED_RELEASE_TABLET | Freq: Every day | ORAL | Status: DC

## 2012-12-15 NOTE — Telephone Encounter (Signed)
Patient called and advised she has a new PCP and is waiting to be seen she asked if we could call in one last month of her BP meds for her as she has been out for 2 days so far. She wants it called to [facility] if we can. She advised she has spoken with Dr Ninetta Lights about this and understands that he wants her to have a PCP which is why she got one but she has had a hard time getting in to see him.

## 2012-12-19 ENCOUNTER — Other Ambulatory Visit: Payer: Self-pay | Admitting: *Deleted

## 2012-12-19 DIAGNOSIS — B20 Human immunodeficiency virus [HIV] disease: Secondary | ICD-10-CM

## 2012-12-19 MED ORDER — DARUNAVIR ETHANOLATE 800 MG PO TABS: 800.0000 mg | ORAL_TABLET | Freq: Every day | ORAL | Status: DC

## 2013-01-22 ENCOUNTER — Encounter: Payer: Self-pay | Admitting: *Deleted

## 2013-02-03 ENCOUNTER — Other Ambulatory Visit: Payer: Self-pay | Admitting: Family Medicine

## 2013-03-17 ENCOUNTER — Ambulatory Visit: Payer: Medicare Other | Admitting: Urology

## 2013-03-18 ENCOUNTER — Other Ambulatory Visit: Payer: Self-pay | Admitting: *Deleted

## 2013-03-18 DIAGNOSIS — B20 Human immunodeficiency virus [HIV] disease: Secondary | ICD-10-CM

## 2013-03-18 MED ORDER — RITONAVIR 100 MG PO CAPS: 100.0000 mg | ORAL_CAPSULE | Freq: Every day | ORAL | Status: DC

## 2013-03-18 MED ORDER — RALTEGRAVIR POTASSIUM 400 MG PO TABS: 400.0000 mg | ORAL_TABLET | Freq: Every day | ORAL | Status: DC

## 2013-03-18 MED ORDER — EMTRICITABINE-TENOFOVIR DF 200-300 MG PO TABS: 1.0000 | ORAL_TABLET | Freq: Every day | ORAL | Status: DC

## 2013-03-23 ENCOUNTER — Other Ambulatory Visit: Payer: Medicare Other

## 2013-04-06 ENCOUNTER — Telehealth: Payer: Self-pay | Admitting: Licensed Clinical Social Worker

## 2013-04-06 ENCOUNTER — Other Ambulatory Visit: Payer: Self-pay | Admitting: *Deleted

## 2013-04-06 ENCOUNTER — Encounter: Payer: Self-pay | Admitting: Infectious Diseases

## 2013-04-06 ENCOUNTER — Ambulatory Visit (INDEPENDENT_AMBULATORY_CARE_PROVIDER_SITE_OTHER): Payer: Medicare Other | Admitting: Infectious Diseases

## 2013-04-06 VITALS — BP 130/91 | HR 111 | Temp 97.7°F

## 2013-04-06 DIAGNOSIS — A630 Anogenital (venereal) warts: Secondary | ICD-10-CM

## 2013-04-06 DIAGNOSIS — E785 Hyperlipidemia, unspecified: Secondary | ICD-10-CM

## 2013-04-06 DIAGNOSIS — R8789 Other abnormal findings in specimens from female genital organs: Secondary | ICD-10-CM

## 2013-04-06 DIAGNOSIS — B20 Human immunodeficiency virus [HIV] disease: Secondary | ICD-10-CM

## 2013-04-06 DIAGNOSIS — R5381 Other malaise: Secondary | ICD-10-CM

## 2013-04-06 LAB — CBC
HCT: 38.2 % (ref 36.0–46.0)
MCH: 28.8 pg (ref 26.0–34.0)
MCV: 87.2 fL (ref 78.0–100.0)
Platelets: 273 10*3/uL (ref 150–400)
RDW: 17.1 % — ABNORMAL HIGH (ref 11.5–15.5)

## 2013-04-06 LAB — COMPREHENSIVE METABOLIC PANEL
ALT: 43 U/L — ABNORMAL HIGH (ref 0–35)
AST: 144 U/L — ABNORMAL HIGH (ref 0–37)
BUN: 6 mg/dL (ref 6–23)
Creat: 0.52 mg/dL (ref 0.50–1.10)
Total Bilirubin: 0.4 mg/dL (ref 0.3–1.2)

## 2013-04-06 MED ORDER — IMIQUIMOD 5 % EX CREA: TOPICAL_CREAM | CUTANEOUS | Status: DC

## 2013-04-06 MED ORDER — CYANOCOBALAMIN 1000 MCG/ML IJ SOLN
1000.0000 ug | Freq: Once | INTRAMUSCULAR | Status: AC
  Administered 2013-04-06: 1000 ug via INTRAMUSCULAR

## 2013-04-06 NOTE — Assessment & Plan Note (Signed)
Will try to get her into HRA clinic. Refill aldara

## 2013-04-06 NOTE — Assessment & Plan Note (Signed)
She appears to be doing well on a complicated regimen. Will cont her meds, recheck her labs today. Give her condoms. See her back in 6 months.

## 2013-04-06 NOTE — Addendum Note (Signed)
Addended by: Mariea Clonts D on: 04/06/2013 10:57 AM   Modules accepted: Orders

## 2013-04-06 NOTE — Assessment & Plan Note (Signed)
States she was started on "lipitor for her blood pressure",  will have her call clinic with dose of rx.

## 2013-04-06 NOTE — Progress Notes (Signed)
  Subjective:    Patient ID: [name], female    DOB: [birth year], 52 y.o.   MRN: [id number]  HPI 52 yo F with long hx of HIV+, bipolar, abn pap, anal warts.  Was previous eval and felt to need R TKR. Now she is having pain in her L knee. She has since had conservative mgmt (injections in both knees). She is having difficulty with LLE spasms as well. She feels that now she is ready for surgery. Has new PCP- Dr Mikeal Hawthorne. Has gluteal boil, puts "cream on it" and it goes away.  Has recurrent anal wart.   HIV 1 RNA Quant (copies/mL)  Date Value  11/05/2012 <20   07/23/2012 <20   03/13/2012 2075*     CD4 T Cell Abs (cmm)  Date Value  11/05/2012 1060   09/26/2011 390*  11/15/2010 810    Lab Results  Component Value Date   CHOL 238* 11/05/2012   HDL 74 11/05/2012   LDLCALC 134* 11/05/2012   TRIG 152* 11/05/2012   CHOLHDL 3.2 11/05/2012    NEEDS PAP  Review of Systems  Constitutional: Negative for appetite change and unexpected weight change.  Gastrointestinal: Positive for constipation. Negative for diarrhea.  Genitourinary: Positive for frequency.  Musculoskeletal: Positive for arthralgias.       Objective:   Physical Exam  Constitutional: She appears well-developed and well-nourished.  HENT:  Mouth/Throat: No oropharyngeal exudate.  Eyes: EOM are normal. Pupils are equal, round, and reactive to light.  Neck: Neck supple.  Cardiovascular: Normal rate, regular rhythm and normal heart sounds.   Pulmonary/Chest: Effort normal and breath sounds normal.  Abdominal: Soft. Bowel sounds are normal. There is no tenderness. There is no rebound.  Musculoskeletal: She exhibits no edema and no tenderness.  Lymphadenopathy:    She has no cervical adenopathy.          Assessment & Plan:

## 2013-04-06 NOTE — Addendum Note (Signed)
Addended by: Wendall Mola A on: 04/06/2013 10:36 AM   Modules accepted: Orders

## 2013-04-06 NOTE — Telephone Encounter (Signed)
Patient called stating that she was here today and was supposed to have something called in for constipation and urinary frequency and incontinence. Please advise

## 2013-04-06 NOTE — Assessment & Plan Note (Signed)
Will have her repeat PAP and mammo.

## 2013-04-07 LAB — HIV-1 RNA QUANT-NO REFLEX-BLD: HIV 1 RNA Quant: <20 copies/mL (ref <20)

## 2013-04-07 LAB — T-HELPER CELL (CD4) - (RCID CLINIC ONLY)
CD4 % Helper T Cell: 28 % — ABNORMAL LOW (ref 33–55)
CD4 T Cell Abs: 790 uL (ref 400–2700)

## 2013-04-07 NOTE — Telephone Encounter (Signed)
Pt already taking metamucil otc, I will not be writing rx for urinary frequency.

## 2013-04-07 NOTE — Telephone Encounter (Signed)
Ok I will let the patient know 

## 2013-05-08 ENCOUNTER — Encounter (HOSPITAL_COMMUNITY): Payer: Self-pay | Admitting: *Deleted

## 2013-05-08 ENCOUNTER — Emergency Department (HOSPITAL_COMMUNITY): Payer: Medicare Other

## 2013-05-08 ENCOUNTER — Emergency Department (HOSPITAL_COMMUNITY)
Admission: EM | Admit: 2013-05-08 | Discharge: 2013-05-08 | Disposition: A | Discharge status: Home / Self Care | Payer: Medicare Other | Attending: Emergency Medicine | Admitting: Emergency Medicine

## 2013-05-08 DIAGNOSIS — J45909 Unspecified asthma, uncomplicated: Secondary | ICD-10-CM | POA: Insufficient documentation

## 2013-05-08 DIAGNOSIS — R51 Headache: Secondary | ICD-10-CM | POA: Insufficient documentation

## 2013-05-08 DIAGNOSIS — Z8719 Personal history of other diseases of the digestive system: Secondary | ICD-10-CM | POA: Insufficient documentation

## 2013-05-08 DIAGNOSIS — G8929 Other chronic pain: Secondary | ICD-10-CM | POA: Insufficient documentation

## 2013-05-08 DIAGNOSIS — Z8669 Personal history of other diseases of the nervous system and sense organs: Secondary | ICD-10-CM | POA: Insufficient documentation

## 2013-05-08 DIAGNOSIS — IMO0002 Reserved for concepts with insufficient information to code with codable children: Secondary | ICD-10-CM | POA: Insufficient documentation

## 2013-05-08 DIAGNOSIS — Z21 Asymptomatic human immunodeficiency virus [HIV] infection status: Secondary | ICD-10-CM | POA: Insufficient documentation

## 2013-05-08 DIAGNOSIS — R05 Cough: Secondary | ICD-10-CM | POA: Insufficient documentation

## 2013-05-08 DIAGNOSIS — R112 Nausea with vomiting, unspecified: Secondary | ICD-10-CM

## 2013-05-08 DIAGNOSIS — J3489 Other specified disorders of nose and nasal sinuses: Secondary | ICD-10-CM | POA: Insufficient documentation

## 2013-05-08 DIAGNOSIS — Z8679 Personal history of other diseases of the circulatory system: Secondary | ICD-10-CM | POA: Insufficient documentation

## 2013-05-08 DIAGNOSIS — R059 Cough, unspecified: Secondary | ICD-10-CM | POA: Insufficient documentation

## 2013-05-08 DIAGNOSIS — J069 Acute upper respiratory infection, unspecified: Secondary | ICD-10-CM

## 2013-05-08 DIAGNOSIS — R0982 Postnasal drip: Secondary | ICD-10-CM | POA: Insufficient documentation

## 2013-05-08 DIAGNOSIS — R109 Unspecified abdominal pain: Secondary | ICD-10-CM | POA: Insufficient documentation

## 2013-05-08 DIAGNOSIS — R197 Diarrhea, unspecified: Secondary | ICD-10-CM | POA: Insufficient documentation

## 2013-05-08 DIAGNOSIS — F3289 Other specified depressive episodes: Secondary | ICD-10-CM | POA: Insufficient documentation

## 2013-05-08 DIAGNOSIS — Z8709 Personal history of other diseases of the respiratory system: Secondary | ICD-10-CM | POA: Insufficient documentation

## 2013-05-08 DIAGNOSIS — Z8739 Personal history of other diseases of the musculoskeletal system and connective tissue: Secondary | ICD-10-CM | POA: Insufficient documentation

## 2013-05-08 DIAGNOSIS — F17200 Nicotine dependence, unspecified, uncomplicated: Secondary | ICD-10-CM | POA: Insufficient documentation

## 2013-05-08 DIAGNOSIS — I10 Essential (primary) hypertension: Secondary | ICD-10-CM | POA: Insufficient documentation

## 2013-05-08 DIAGNOSIS — F329 Major depressive disorder, single episode, unspecified: Secondary | ICD-10-CM | POA: Insufficient documentation

## 2013-05-08 DIAGNOSIS — F319 Bipolar disorder, unspecified: Secondary | ICD-10-CM | POA: Insufficient documentation

## 2013-05-08 DIAGNOSIS — Z79899 Other long term (current) drug therapy: Secondary | ICD-10-CM | POA: Insufficient documentation

## 2013-05-08 DIAGNOSIS — F411 Generalized anxiety disorder: Secondary | ICD-10-CM | POA: Insufficient documentation

## 2013-05-08 DIAGNOSIS — Z8619 Personal history of other infectious and parasitic diseases: Secondary | ICD-10-CM | POA: Insufficient documentation

## 2013-05-08 DIAGNOSIS — Z8701 Personal history of pneumonia (recurrent): Secondary | ICD-10-CM | POA: Insufficient documentation

## 2013-05-08 HISTORY — DX: Pain in unspecified knee: M25.569

## 2013-05-08 HISTORY — DX: Other chronic pain: G89.29

## 2013-05-08 LAB — LACTIC ACID, PLASMA
Lactic Acid, Venous: 2.7 mmol/L — ABNORMAL HIGH (ref 0.5–2.2)
Lactic Acid, Venous: 2.5 mmol/L — ABNORMAL HIGH (ref 0.5–2.2)

## 2013-05-08 LAB — CBC WITH DIFFERENTIAL/PLATELET
Basophils Relative: 0 % (ref 0–1)
Eosinophils Relative: 2 % (ref 0–5)
Hemoglobin: 12.4 g/dL (ref 12.0–15.0)
Lymphs Abs: 3.9 10*3/uL (ref 0.7–4.0)
MCH: 30.8 pg (ref 26.0–34.0)
MCV: 92.1 fL (ref 78.0–100.0)
Monocytes Absolute: 0.4 10*3/uL (ref 0.1–1.0)
Monocytes Relative: 6 % (ref 3–12)
RBC: 4.03 MIL/uL (ref 3.87–5.11)
WBC: 6.8 10*3/uL (ref 4.0–10.5)

## 2013-05-08 LAB — URINALYSIS W MICROSCOPIC + REFLEX CULTURE
Bilirubin Urine: NEGATIVE
Glucose, UA: NEGATIVE mg/dL
Hgb urine dipstick: NEGATIVE
Ketones, ur: NEGATIVE mg/dL
Protein, ur: NEGATIVE mg/dL
Urobilinogen, UA: 0.2 mg/dL (ref 0.0–1.0)

## 2013-05-08 LAB — COMPREHENSIVE METABOLIC PANEL
AST: 35 U/L (ref 0–37)
Albumin: 3.4 g/dL — ABNORMAL LOW (ref 3.5–5.2)
Alkaline Phosphatase: 150 U/L — ABNORMAL HIGH (ref 39–117)
CO2: 31 mEq/L (ref 19–32)
Chloride: 98 mEq/L (ref 96–112)
Creatinine, Ser: 0.54 mg/dL (ref 0.50–1.10)
GFR calc non Af Amer: >90 mL/min (ref >90)
Potassium: 3.8 mEq/L (ref 3.5–5.1)
Total Bilirubin: 0.2 mg/dL — ABNORMAL LOW (ref 0.3–1.2)

## 2013-05-08 MED ORDER — IOHEXOL 300 MG/ML  SOLN
50.0000 mL | Freq: Once | INTRAMUSCULAR | Status: AC | PRN
  Administered 2013-05-08: 50 mL via ORAL

## 2013-05-08 MED ORDER — ONDANSETRON HCL 4 MG/2ML IJ SOLN
4.0000 mg | INTRAMUSCULAR | Status: AC | PRN
  Administered 2013-05-08 (×2): 4 mg via INTRAVENOUS
  Filled 2013-05-08 (×2): qty 2

## 2013-05-08 MED ORDER — SODIUM CHLORIDE 0.9 % IV BOLUS (SEPSIS)
500.0000 mL | Freq: Once | INTRAVENOUS | Status: AC
  Administered 2013-05-08: 500 mL via INTRAVENOUS

## 2013-05-08 MED ORDER — SODIUM CHLORIDE 0.9 % IV SOLN
INTRAVENOUS | Status: DC
  Administered 2013-05-08: 15:00:00 via INTRAVENOUS

## 2013-05-08 MED ORDER — DIAZEPAM 5 MG PO TABS
5.0000 mg | ORAL_TABLET | Freq: Once | ORAL | Status: AC
  Administered 2013-05-08: 5 mg via ORAL
  Filled 2013-05-08: qty 1

## 2013-05-08 MED ORDER — MORPHINE SULFATE 4 MG/ML IJ SOLN
4.0000 mg | INTRAMUSCULAR | Status: AC | PRN
  Administered 2013-05-08 (×2): 4 mg via INTRAVENOUS
  Filled 2013-05-08 (×2): qty 1

## 2013-05-08 MED ORDER — ONDANSETRON HCL 4 MG PO TABS: 4.0000 mg | ORAL_TABLET | Freq: Three times a day (TID) | ORAL | Status: DC | PRN

## 2013-05-08 MED ORDER — IOHEXOL 300 MG/ML  SOLN
100.0000 mL | Freq: Once | INTRAMUSCULAR | Status: AC | PRN
  Administered 2013-05-08: 100 mL via INTRAVENOUS

## 2013-05-08 MED ORDER — OXYCODONE-ACETAMINOPHEN 5-325 MG PO TABS: 1.0000 | ORAL_TABLET | Freq: Once | ORAL | Status: DC

## 2013-05-08 NOTE — ED Notes (Addendum)
Began having nasal drainage, nausea, vomiting and diarrhea about 1 week ago.  Took Immodium AD and it eased, but now it is back.  Generalized abdominal cramping.  She has also been experiencing frequent urination. Pt. Is HIV positive and wishes for staff to be discrete in discussing around her aide.

## 2013-05-08 NOTE — ED Provider Notes (Signed)
History    CSN: [id number] Arrival date & time 05/08/13  1249  First MD Initiated Contact with Patient 05/08/13 1418     Chief Complaint  Patient presents with  . Abdominal Pain  . Emesis  . Diarrhea  . Headache  . Sore Throat   HPI Pt was seen at 1435.   Per pt, c/o gradual onset and persistence of constant left sided abd "pain" for the past 1 week. Describes the abd pain as "cramping." Has been associated with multiple intermittent episodes of N/V/D. Describes the stools as "watery." States she has taken imodium with improvement in the diarrhea.  States the N/V only occurs "after I cough."  Pt also c/o gradual onset and persistence of constant runny/stuffy nose, sinus congestion, post-nasal drip, sore throat and cough for the past 2 weeks. States she has been eval by her Allergy MD and her PMD for same. States they rx her "abx" and "changed around my nasal sprays" without change in her symptoms. Denies fevers, no back pain, no rash, no CP/SOB, no black or blood in stools or emesis.       Past Medical History  Diagnosis Date  . Baker's cyst   . Pulmonary lesion 1/03    on RUL onn CT   . Hemorrhoids   . Zoster 2001  . Family history of diabetes mellitus   . Tobacco user   . Other abnormal Papanicolaou smear of cervix and cervical HPV(795.09)   . Herpes zoster   . Hemorrhoids   . Baker's cyst   . Pancreatitis   . HPV (human papilloma virus) infection   . HIV disease     take Prezista,Truvada,Norvir,and Isentress daily  . Bipolar disorder     With hx of psychotic features.  . Articular cartilage disorder of knee   . Bursitis of shoulder   . Rotator cuff arthropathy   . Hypertension     takes Clonidine and Verapamil daily  . Emphysema   . Asthma     Albuterol resue inhaler and QVAR daily  . Shortness of breath     with exertion and stress  . Pneumonia     hx of;last time 1 1/74yrs ago  . Headache(784.0)   . Dizziness   . Arthritis   . Joint pain   . Joint swelling    . Chronic back pain     buldging disc  . Bruises easily   . Gastric ulcer   . GERD (gastroesophageal reflux disease)     takes Nexium daily  . Chronic constipation   . Urinary frequency   . Urinary incontinence   . Nocturia   . History of blood transfusion at age 89  . Early cataracts, bilateral   . Anxiety   . Depression     takes Xanax daily and Depakote bid  . History of shingles   . 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 Tobacco abuse 10/26/2012  . HIV (human immunodeficiency virus infection)   . Chronic back pain   . Chronic knee pain    Past Surgical History  Procedure Laterality Date  . Right breast lumpectomy    . Total abdominal hysterectomy w/ bilateral salpingoophorectomy  at age 23  . Breast surgery  as a teenager    rt breast biopsy  . Cholecystectomy  10+yrs ago  . Knee arthroscopy      bilateral  . Appendectomy      with hysterectomy  . Finger surgery  rt middle finger with pin in it  . Epidural injections    . Esophagogastroduodenoscopy    . Shoulder hemi-arthroplasty  05/06/2012    Procedure: SHOULDER HEMI-ARTHROPLASTY;  Surgeon: Eulas Post, MD;  Location: [facility] OR;  Service: Orthopedics;  Laterality: Right;  . Abdominal hysterectomy    . Joint replacement     Family History  Problem Relation Age of Onset  . Diabetes      Family History  . Hypertension      Family history   . Hypertension Mother   . Diabetes Mother   . Diabetes Father   . Cancer Sister 69    x2 (liver)  . Diabetes Brother    History  Substance Use Topics  . Smoking status: Current Every Day Smoker -- 1.00 packs/day for 20 years    Types: Cigarettes  . Smokeless tobacco: Never Used  . Alcohol Use: 0.5 oz/week    1 drink(s) per week     Comment: brandy    Review of Systems ROS: Statement: All systems negative except as marked or noted in the HPI; Constitutional: Negative for fever and chills. ; ; Eyes: Negative for eye pain,  redness and discharge. ; ; ENMT: Negative for ear pain, hoarseness, +nasal congestion, sinus pressure, rhinorrhea, post-nasal drip and sore throat. ; ; Cardiovascular: Negative for chest pain, palpitations, diaphoresis, dyspnea and peripheral edema. ; ; Respiratory: +cough with post-tussive emesis. Negative for wheezing and stridor. ; ; Gastrointestinal: +N/V/D, abd pain. Negative for blood in stool, hematemesis, jaundice and rectal bleeding. . ; ; Genitourinary: Negative for dysuria, flank pain and hematuria. ; ; Musculoskeletal: Negative for back pain and neck pain. Negative for swelling and trauma.; ; Skin: Negative for pruritus, rash, abrasions, blisters, bruising and skin lesion.; ; Neuro: Negative for headache, lightheadedness and neck stiffness. Negative for weakness, altered level of consciousness , altered mental status, extremity weakness, paresthesias, involuntary movement, seizure and syncope.       Allergies  Aspirin and Sulfonamide derivatives  Home Medications   Current Outpatient Rx  Name  Route  Sig  Dispense  Refill  . albuterol (PROVENTIL HFA;VENTOLIN HFA) 108 (90 BASE) MCG/ACT inhaler   Inhalation   Inhale 2 puffs into the lungs every 4 (four) hours as needed. 2 puffs every 4-6 hours as needed for shortness of breath         . ALPRAZolam (XANAX) 1 MG tablet   Oral   Take 1 tablet (1 mg total) by mouth 2 (two) times daily. Take 1 tablet by mouth two times a day as needed. For anxiety   60 tablet   0   . Azelastine HCl 0.15 % SOLN   Nasal   Place 1 spray into the nose daily.         . beclomethasone (QVAR) 40 MCG/ACT inhaler   Inhalation   Inhale 2 puffs into the lungs 2 (two) times daily. 2 puffs two times a day   1 Inhaler   3   . cetirizine (ZYRTEC) 10 MG tablet   Oral   Take 10 mg by mouth daily.         . Cyanocobalamin (VITAMIN B-12 IJ)   Injection   Inject as directed every 30 (thirty) days.         . Darunavir Ethanolate (PREZISTA) 800 MG  tablet   Oral   Take 1 tablet (800 mg total) by mouth daily with breakfast.   30 tablet   6   . diazepam (  VALIUM) 5 MG tablet   Oral   Take 5 mg by mouth every 6 (six) hours as needed. For muscle spasms/anxiety         . diclofenac (VOLTAREN) 75 MG EC tablet   Oral   Take 75 mg by mouth 2 (two) times daily.         Marland Kitchen emtricitabine-tenofovir (TRUVADA) 200-300 MG per tablet   Oral   Take 1 tablet by mouth daily.   90 tablet   4   . esomeprazole (NEXIUM) 40 MG capsule   Oral   Take 1 capsule (40 mg total) by mouth daily before breakfast. Take one capsule by mouth once a day   90 capsule   1   . HYDROcodone-acetaminophen (NORCO) 7.5-325 MG per tablet   Oral   Take 1 tablet by mouth Every 4 hours as needed. *take one tablet every 4 to 6 hours as needed for pain*         . imiquimod (ALDARA) 5 % cream      Apply to affected area at bedtime, and wash off after 6 to 10 hours   24 each   0   . loperamide (IMODIUM) 2 MG capsule   Oral   Take 2 mg by mouth 4 (four) times daily as needed for diarrhea or loose stools.         . methocarbamol (ROBAXIN) 750 MG tablet   Oral   Take 1 tablet (750 mg total) by mouth 4 (four) times daily as needed. For muscle spasms   75 tablet   1   . oxyCODONE-acetaminophen (PERCOCET) 7.5-325 MG per tablet   Oral   Take 1 tablet by mouth every 8 (eight) hours as needed.         . potassium chloride (K-DUR) 10 MEQ tablet   Oral   Take 10 mEq by mouth daily.         . predniSONE (DELTASONE) 20 MG tablet   Oral   Take 20 mg by mouth daily with breakfast.         . promethazine (PHENERGAN) 25 MG tablet   Oral   Take 1 tablet (25 mg total) by mouth every 6 (six) hours as needed.   30 tablet   1   . raltegravir (ISENTRESS) 400 MG tablet   Oral   Take 1 tablet (400 mg total) by mouth daily.   90 tablet   4   . ritonavir (NORVIR) 100 MG capsule   Oral   Take 1 capsule (100 mg total) by mouth daily.   90 capsule   4   .  traMADol (ULTRAM) 50 MG tablet   Oral   Take 100 mg by mouth every 6 (six) hours as needed.         . verapamil (CALAN-SR) 240 MG CR tablet   Oral   Take 1 tablet (240 mg total) by mouth daily.   30 tablet   1   . Vitamin D, Ergocalciferol, (DRISDOL) 50000 UNITS CAPS   Oral   Take 50,000 Units by mouth every 7 (seven) days. Take on mondays         . zolpidem (AMBIEN) 5 MG tablet   Oral   Take 5 mg by mouth at bedtime.          BP 123/86  Pulse 95  Temp(Src) 98 F (36.7 C) (Oral)  Resp 20  Ht 5\' 4"  (1.626 m)  Wt 175 lb (79.379 kg)  BMI  30.02 kg/m2  SpO2 98% BP 123/86  Pulse 95  Temp(Src) 98 F (36.7 C) (Oral)  Resp 20  Ht 5\' 4"  (1.626 m)  Wt 175 lb (79.379 kg)  BMI 30.02 kg/m2  SpO2 98%  Physical Exam 1440: Physical examination:  Nursing notes reviewed; Vital signs and O2 SAT reviewed;  Constitutional: Well developed, Well nourished, Well hydrated, In no acute distress; Head:  Normocephalic, atraumatic; Eyes: EOMI, PERRL, No scleral icterus; ENMT: TM's clear bilat. +edemetous nasal turbinates bilat with clear rhinorrhea. Mouth and pharynx without lesions. No tonsillar exudates. No intra-oral edema. No submandibular or sublingual edema. No hoarse voice, no drooling, no stridor. No pain with manipulation of larynx. Mouth and pharynx normal, Mucous membranes moist; Neck: Supple, Full range of motion, No lymphadenopathy; Cardiovascular: Regular rate and rhythm, No murmur, rub, or gallop; Respiratory: Breath sounds clear & equal bilaterally, No rales, rhonchi, wheezes.  Speaking full sentences with ease, Normal respiratory effort/excursion; Chest: Nontender, Movement normal; Abdomen: Soft, +LUQ and LLQ tenderness to palp. No rebound or guarding. Nondistended, Normal bowel sounds; Genitourinary: No CVA tenderness; Extremities: Pulses normal, No tenderness, No edema, No calf edema or asymmetry.; Neuro: AA&Ox3, Major CN grossly intact.  Speech clear. No gross focal motor or  sensory deficits in extremities.; Skin: Color normal, Warm, Dry.   ED Course  Procedures  1445:  EPIC chart reviewed: 10/2012: CD4 count 1060, HIV RNA quant <20.   1915:  Pt requesting her usual pain meds and muscle relaxant for her chronic knee pain. CT with mildly dilated hepatic ducts since 2008; LFT's normal today as well as alk phos trending downward from recent previous.  Lactic acid mildly elevated, but trending down after IVF; doubt sepsis at this time. Pt has tol PO well while in the ED without N/V.  No stooling while in the ED.  Abd benign, VSS, afebrile. Wants to go home now. Will tx symptomatically at this time. Dx and testing d/w pt and family.  Questions answered.  Verb understanding, agreeable to d/c home with outpt f/u.   MDM  MDM Reviewed: previous chart, nursing note and vitals Reviewed previous: labs Interpretation: labs, x-ray and CT scan   Results for orders placed during the hospital encounter of 05/08/13  RAPID STREP SCREEN      Result Value Range   Streptococcus, Group A Screen (Direct) NEGATIVE  NEGATIVE  URINALYSIS W MICROSCOPIC + REFLEX CULTURE      Result Value Range   Color, Urine YELLOW  YELLOW   APPearance CLEAR  CLEAR   Specific Gravity, Urine <1.005 (*) 1.005 - 1.030   pH 6.0  5.0 - 8.0   Glucose, UA NEGATIVE  NEGATIVE mg/dL   Hgb urine dipstick NEGATIVE  NEGATIVE   Bilirubin Urine NEGATIVE  NEGATIVE   Ketones, ur NEGATIVE  NEGATIVE mg/dL   Protein, ur NEGATIVE  NEGATIVE mg/dL   Urobilinogen, UA 0.2  0.0 - 1.0 mg/dL   Nitrite NEGATIVE  NEGATIVE   Leukocytes, UA NEGATIVE  NEGATIVE   WBC, UA 0-2  <3 WBC/hpf   Bacteria, UA FEW (*) RARE   Squamous Epithelial / LPF FEW (*) RARE  CBC WITH DIFFERENTIAL      Result Value Range   WBC 6.8  4.0 - 10.5 K/uL   RBC 4.03  3.87 - 5.11 MIL/uL   Hemoglobin 12.4  12.0 - 15.0 g/dL   HCT 16.1  09.6 - 04.5 %   MCV 92.1  78.0 - 100.0 fL   MCH 30.8  26.0 -  34.0 pg   MCHC 33.4  30.0 - 36.0 g/dL   RDW 16.1  09.6  - 04.5 %   Platelets 216  150 - 400 K/uL   Neutrophils Relative % 36 (*) 43 - 77 %   Lymphocytes Relative 56 (*) 12 - 46 %   Monocytes Relative 6  3 - 12 %   Eosinophils Relative 2  0 - 5 %   Basophils Relative 0  0 - 1 %   Neutro Abs 2.4  1.7 - 7.7 K/uL   Lymphs Abs 3.9  0.7 - 4.0 K/uL   Monocytes Absolute 0.4  0.1 - 1.0 K/uL   Eosinophils Absolute 0.1  0.0 - 0.7 K/uL   Basophils Absolute 0.0  0.0 - 0.1 K/uL   WBC Morphology ATYPICAL LYMPHOCYTES     Smear Review LARGE PLATELETS PRESENT    COMPREHENSIVE METABOLIC PANEL      Result Value Range   Sodium 139  135 - 145 mEq/L   Potassium 3.8  3.5 - 5.1 mEq/L   Chloride 98  96 - 112 mEq/L   CO2 31  19 - 32 mEq/L   Glucose, Bld 102 (*) 70 - 99 mg/dL   BUN 9  6 - 23 mg/dL   Creatinine, Ser 4.09  0.50 - 1.10 mg/dL   Calcium 8.7  8.4 - 81.1 mg/dL   Total Protein 7.3  6.0 - 8.3 g/dL   Albumin 3.4 (*) 3.5 - 5.2 g/dL   AST 35  0 - 37 U/L   ALT 27  0 - 35 U/L   Alkaline Phosphatase 150 (*) 39 - 117 U/L   Total Bilirubin 0.2 (*) 0.3 - 1.2 mg/dL   GFR calc non Af Amer >90  >90 mL/min   GFR calc Af Amer >90  >90 mL/min  LIPASE, BLOOD      Result Value Range   Lipase 20  11 - 59 U/L  LACTIC ACID, PLASMA      Result Value Range   Lactic Acid, Venous 2.7 (*) 0.5 - 2.2 mmol/L  LACTIC ACID, PLASMA      Result Value Range   Lactic Acid, Venous 2.5 (*) 0.5 - 2.2 mmol/L   Dg Chest 2 View 05/08/2013   *RADIOLOGY REPORT*  Clinical Data: Abdominal pain, emesis, and diarrhea.  CHEST - 2 VIEW  Comparison: 10/25/2012.  Findings: Cardiac size upper limits normal.  Clear lung fields. Normal mediastinal contours.  Right shoulder replacement.  Left shoulder avascular necrosis.  Improved aeration from priors.  IMPRESSION: No active cardiopulmonary disease.   Original Report Authenticated By: Davonna Belling, M.D.   Ct Abdomen Pelvis W Contrast 05/08/2013   *RADIOLOGY REPORT*  Clinical Data: Abdominal pain, nausea/vomiting, history of pancreatitis and HIV.   Prior cholecystectomy, hysterectomy, and appendectomy.  CT ABDOMEN AND PELVIS WITH CONTRAST  Technique:  Multidetector CT imaging of the abdomen and pelvis was performed following the standard protocol during bolus administration of intravenous contrast.  Contrast: 100 ml Omnipaque-300 IV  Comparison: 06/27/2007  Findings: Lung bases are essentially clear.  Tiny hiatal hernia.  No focal hepatic lesion is seen.  Spleen and adrenal glands are unremarkable.  Numerous parenchymal pancreatic calcifications, likely reflecting prior/chronic pancreatitis.  No peripancreatic inflammatory changes.  Status post cholecystectomy.  Mild intrahepatic and extrahepatic ductal dilatation, increased from 2008.  Common duct measures 11 mm.  Kidneys are within normal limits.  No hydronephrosis.  No evidence of bowel obstruction.  Prior appendectomy. Transverse/descending colon is underdistended and without  associated inflammatory changes.  Atherosclerotic calcifications of the abdominal aorta and branch vessels.  No abdominal ascites.  No suspicious abdominopelvic lymphadenopathy.  Small mesenteric nodes which do not meet pathologic CT size criteria.  Status post hysterectomy.  No adnexal masses.  Bladder is within normal limits.  Visualized osseous structures are within normal limits.  IMPRESSION: No evidence of bowel obstruction.  Sequela of prior/chronic pancreatitis.  No peripancreatic inflammatory changes by CT.  Mild intrahepatic/extrahepatic ductal dilatation, progressed from 2008.  Common duct measures up to 11 mm.  In the setting of abnormal LFTs, consider ERCP or MRCP as clinically warranted.   Original Report Authenticated By: Charline Bills, M.D.    Results for BRANDON, SCARBROUGH (MRN [id number]) as of 05/08/2013 18:00  Ref. Range 04/06/2013 10:57 05/08/2013 13:00  Alkaline Phosphatase Latest Range: 39-117 U/L 199 (H) 150 (H)     Laray Anger, DO 05/11/13 [phone]

## 2013-05-10 LAB — CULTURE, GROUP A STREP

## 2013-05-29 ENCOUNTER — Emergency Department (HOSPITAL_COMMUNITY): Payer: Medicare Other

## 2013-05-29 ENCOUNTER — Inpatient Hospital Stay (HOSPITAL_COMMUNITY)
Admission: EM | Admit: 2013-05-29 | Discharge: 2013-05-31 | DRG: 974 | Disposition: A | Discharge status: Home / Self Care | Payer: Medicare Other | Attending: Family Medicine | Admitting: Family Medicine

## 2013-05-29 ENCOUNTER — Encounter (HOSPITAL_COMMUNITY): Payer: Self-pay | Admitting: *Deleted

## 2013-05-29 DIAGNOSIS — J189 Pneumonia, unspecified organism: Secondary | ICD-10-CM

## 2013-05-29 DIAGNOSIS — I959 Hypotension, unspecified: Secondary | ICD-10-CM

## 2013-05-29 DIAGNOSIS — F1210 Cannabis abuse, uncomplicated: Secondary | ICD-10-CM | POA: Diagnosis present

## 2013-05-29 DIAGNOSIS — Z833 Family history of diabetes mellitus: Secondary | ICD-10-CM

## 2013-05-29 DIAGNOSIS — Z90710 Acquired absence of both cervix and uterus: Secondary | ICD-10-CM

## 2013-05-29 DIAGNOSIS — M170 Bilateral primary osteoarthritis of knee: Secondary | ICD-10-CM | POA: Diagnosis present

## 2013-05-29 DIAGNOSIS — J9600 Acute respiratory failure, unspecified whether with hypoxia or hypercapnia: Secondary | ICD-10-CM | POA: Diagnosis present

## 2013-05-29 DIAGNOSIS — J9601 Acute respiratory failure with hypoxia: Secondary | ICD-10-CM

## 2013-05-29 DIAGNOSIS — I10 Essential (primary) hypertension: Secondary | ICD-10-CM | POA: Diagnosis present

## 2013-05-29 DIAGNOSIS — B20 Human immunodeficiency virus [HIV] disease: Principal | ICD-10-CM

## 2013-05-29 DIAGNOSIS — K219 Gastro-esophageal reflux disease without esophagitis: Secondary | ICD-10-CM | POA: Diagnosis present

## 2013-05-29 DIAGNOSIS — M1710 Unilateral primary osteoarthritis, unspecified knee: Secondary | ICD-10-CM | POA: Diagnosis present

## 2013-05-29 DIAGNOSIS — F17200 Nicotine dependence, unspecified, uncomplicated: Secondary | ICD-10-CM

## 2013-05-29 DIAGNOSIS — A419 Sepsis, unspecified organism: Secondary | ICD-10-CM | POA: Diagnosis present

## 2013-05-29 DIAGNOSIS — R262 Difficulty in walking, not elsewhere classified: Secondary | ICD-10-CM | POA: Diagnosis present

## 2013-05-29 DIAGNOSIS — Z720 Tobacco use: Secondary | ICD-10-CM

## 2013-05-29 DIAGNOSIS — F319 Bipolar disorder, unspecified: Secondary | ICD-10-CM | POA: Diagnosis present

## 2013-05-29 DIAGNOSIS — E860 Dehydration: Secondary | ICD-10-CM

## 2013-05-29 DIAGNOSIS — Z8249 Family history of ischemic heart disease and other diseases of the circulatory system: Secondary | ICD-10-CM

## 2013-05-29 DIAGNOSIS — R6520 Severe sepsis without septic shock: Secondary | ICD-10-CM | POA: Diagnosis present

## 2013-05-29 DIAGNOSIS — Z79899 Other long term (current) drug therapy: Secondary | ICD-10-CM

## 2013-05-29 DIAGNOSIS — J45909 Unspecified asthma, uncomplicated: Secondary | ICD-10-CM | POA: Diagnosis present

## 2013-05-29 DIAGNOSIS — E669 Obesity, unspecified: Secondary | ICD-10-CM | POA: Diagnosis present

## 2013-05-29 DIAGNOSIS — E872 Acidosis, unspecified: Secondary | ICD-10-CM | POA: Diagnosis present

## 2013-05-29 DIAGNOSIS — F411 Generalized anxiety disorder: Secondary | ICD-10-CM | POA: Diagnosis present

## 2013-05-29 DIAGNOSIS — G8929 Other chronic pain: Secondary | ICD-10-CM | POA: Diagnosis present

## 2013-05-29 LAB — LIPASE, BLOOD: Lipase: 13 U/L (ref 11–59)

## 2013-05-29 LAB — CBC WITH DIFFERENTIAL/PLATELET
Basophils Relative: 0 % (ref 0–1)
Eosinophils Relative: 0 % (ref 0–5)
Hemoglobin: 11.4 g/dL — ABNORMAL LOW (ref 12.0–15.0)
Lymphocytes Relative: 45 % (ref 12–46)
Monocytes Absolute: 0.6 10*3/uL (ref 0.1–1.0)
Monocytes Relative: 11 % (ref 3–12)
Neutrophils Relative %: 44 % (ref 43–77)
Platelets: 200 10*3/uL (ref 150–400)
RBC: 3.8 MIL/uL — ABNORMAL LOW (ref 3.87–5.11)
WBC: 5.7 10*3/uL (ref 4.0–10.5)

## 2013-05-29 LAB — TROPONIN I: Troponin I: <0.3 ng/mL (ref <0.30)

## 2013-05-29 LAB — HEPATIC FUNCTION PANEL
Alkaline Phosphatase: 152 U/L — ABNORMAL HIGH (ref 39–117)
Bilirubin, Direct: 0.1 mg/dL (ref 0.0–0.3)
Indirect Bilirubin: 0.1 mg/dL — ABNORMAL LOW (ref 0.3–0.9)
Total Bilirubin: 0.2 mg/dL — ABNORMAL LOW (ref 0.3–1.2)

## 2013-05-29 LAB — URINALYSIS, ROUTINE W REFLEX MICROSCOPIC
Bilirubin Urine: NEGATIVE
Ketones, ur: NEGATIVE mg/dL
Nitrite: NEGATIVE
Protein, ur: NEGATIVE mg/dL
Urobilinogen, UA: 0.2 mg/dL (ref 0.0–1.0)
pH: 5.5 (ref 5.0–8.0)

## 2013-05-29 LAB — URINE MICROSCOPIC-ADD ON

## 2013-05-29 LAB — BASIC METABOLIC PANEL
CO2: 20 mEq/L (ref 19–32)
Chloride: 100 mEq/L (ref 96–112)
Creatinine, Ser: 0.68 mg/dL (ref 0.50–1.10)
GFR calc Af Amer: >90 mL/min (ref >90)
Potassium: 4.3 mEq/L (ref 3.5–5.1)
Sodium: 137 mEq/L (ref 135–145)

## 2013-05-29 LAB — MRSA PCR SCREENING: MRSA by PCR: NEGATIVE

## 2013-05-29 LAB — PROCALCITONIN: Procalcitonin: <0.1 ng/mL

## 2013-05-29 MED ORDER — VANCOMYCIN HCL IN DEXTROSE 750-5 MG/150ML-% IV SOLN
750.0000 mg | Freq: Three times a day (TID) | INTRAVENOUS | Status: DC
  Filled 2013-05-29 (×8): qty 150

## 2013-05-29 MED ORDER — IOHEXOL 300 MG/ML  SOLN
100.0000 mL | Freq: Once | INTRAMUSCULAR | Status: AC | PRN
  Administered 2013-05-29: 100 mL via INTRAVENOUS

## 2013-05-29 MED ORDER — PIPERACILLIN-TAZOBACTAM 3.375 G IVPB
3.3750 g | Freq: Three times a day (TID) | INTRAVENOUS | Status: DC
  Filled 2013-05-29 (×8): qty 50

## 2013-05-29 MED ORDER — VANCOMYCIN HCL IN DEXTROSE 1-5 GM/200ML-% IV SOLN
1000.0000 mg | Freq: Once | INTRAVENOUS | Status: AC
  Administered 2013-05-29: 1000 mg via INTRAVENOUS
  Filled 2013-05-29: qty 200

## 2013-05-29 MED ORDER — SODIUM CHLORIDE 0.9 % IV SOLN
INTRAVENOUS | Status: AC
  Administered 2013-05-29 (×2): via INTRAVENOUS

## 2013-05-29 MED ORDER — ONDANSETRON HCL 4 MG/2ML IJ SOLN
4.0000 mg | Freq: Once | INTRAMUSCULAR | Status: AC
  Administered 2013-05-29: 4 mg via INTRAVENOUS
  Filled 2013-05-29: qty 2

## 2013-05-29 MED ORDER — PIPERACILLIN-TAZOBACTAM 3.375 G IVPB 30 MIN
3.3750 g | Freq: Once | INTRAVENOUS | Status: AC
  Administered 2013-05-29: 3.375 g via INTRAVENOUS
  Filled 2013-05-29: qty 50

## 2013-05-29 MED ORDER — SODIUM CHLORIDE 0.9 % IV SOLN
1000.0000 mL | Freq: Once | INTRAVENOUS | Status: AC
  Administered 2013-05-29: 1000 mL via INTRAVENOUS

## 2013-05-29 MED ORDER — SODIUM CHLORIDE 0.9 % IV BOLUS (SEPSIS)
2000.0000 mL | Freq: Once | INTRAVENOUS | Status: AC
  Administered 2013-05-29: 2000 mL via INTRAVENOUS

## 2013-05-29 MED ORDER — SODIUM CHLORIDE 0.9 % IV SOLN: INTRAVENOUS | Status: DC

## 2013-05-29 MED ORDER — IOHEXOL 300 MG/ML  SOLN
50.0000 mL | Freq: Once | INTRAMUSCULAR | Status: AC | PRN
  Administered 2013-05-29: 50 mL via ORAL

## 2013-05-29 NOTE — ED Provider Notes (Signed)
CSN: [id number]     Arrival date & time 05/29/13  1258 History  This chart was scribed for Shelda Jakes, MD by Bennett Scrape, ED Scribe. This patient was seen in room APA05/APA05 and the patient's care was started at 1:22 PM.   CC: Hypotension  The history is provided by the patient. No language interpreter was used.    HPI Comments: [name] is a 52 y.o. female brought in by ambulance, who presents to the Emergency Department complaining of hypotension noted while at the dentist office. BP is 88/52 in the ED. She had an appointment today for left upper dental pain that she believes was triggered by sinus pressure. She denies prior episodes of hypotension. She is currently on HTN medications and reports a HTN medication addition of Listal 2 months ago. She is also on Verapamil which she takes in the morning and reports that she has had her BP checked since then in a normal range.  She states that took one hydrocodone earlier but is unsure if she took any HTN medications today. She reports several recent symptoms: nausea, mild amount of emesis, abdominal cramps and diarrhea yesterday, fever and chills the past 4 days and myalgias, dizziness, blurred vision and HA today. She denies CP, new back pain and new SOB.  PCP is Dr. Mikeal Hawthorne  Pt has an aide that helps Mon-Fri.  Past Medical History  Diagnosis Date  . Baker's cyst   . Pulmonary lesion 1/03    on RUL onn CT   . Hemorrhoids   . Zoster 2001  . Family history of diabetes mellitus   . Tobacco user   . Other abnormal Papanicolaou smear of cervix and cervical HPV(795.09)   . Herpes zoster   . Hemorrhoids   . Baker's cyst   . Pancreatitis   . HPV (human papilloma virus) infection   . HIV disease     take Prezista,Truvada,Norvir,and Isentress daily  . Bipolar disorder     With hx of psychotic features.  . Articular cartilage disorder of knee   . Bursitis of shoulder   . Rotator cuff arthropathy   . Hypertension    takes Clonidine and Verapamil daily  . Emphysema   . Asthma     Albuterol resue inhaler and QVAR daily  . Shortness of breath     with exertion and stress  . Pneumonia     hx of;last time 1 1/75yrs ago  . Headache(784.0)   . Dizziness   . Arthritis   . Joint pain   . Joint swelling   . Chronic back pain     buldging disc  . Bruises easily   . Gastric ulcer   . GERD (gastroesophageal reflux disease)     takes Nexium daily  . Chronic constipation   . Urinary frequency   . Urinary incontinence   . Nocturia   . History of blood transfusion at age 74  . Early cataracts, bilateral   . Anxiety   . Depression     takes Xanax daily and Depakote bid  . History of shingles   . 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 Tobacco abuse 10/26/2012  . HIV (human immunodeficiency virus infection)   . Chronic back pain   . Chronic knee pain    Past Surgical History  Procedure Laterality Date  . Right breast lumpectomy    . Total abdominal hysterectomy w/ bilateral salpingoophorectomy  at age 73  . Breast surgery  as a teenager    rt breast biopsy  . Cholecystectomy  10+yrs ago  . Knee arthroscopy      bilateral  . Appendectomy      with hysterectomy  . Finger surgery      rt middle finger with pin in it  . Epidural injections    . Esophagogastroduodenoscopy    . Shoulder hemi-arthroplasty  05/06/2012    Procedure: SHOULDER HEMI-ARTHROPLASTY;  Surgeon: Eulas Post, MD;  Location: [facility] OR;  Service: Orthopedics;  Laterality: Right;  . Abdominal hysterectomy    . Joint replacement     Family History  Problem Relation Age of Onset  . Diabetes      Family History  . Hypertension      Family history   . Hypertension Mother   . Diabetes Mother   . Diabetes Father   . Cancer Sister 74    x2 (liver)  . Diabetes Brother    History  Substance Use Topics  . Smoking status: Current Every Day Smoker -- 1.00 packs/day for 20 years    Types:  Cigarettes  . Smokeless tobacco: Never Used  . Alcohol Use: 0.5 oz/week    1 drink(s) per week     Comment: brandy   No OB history provided.  Review of Systems  Constitutional: Positive for fever and chills (x4 days).  HENT: Positive for neck pain, dental problem and sinus pressure. Negative for congestion, sore throat and rhinorrhea.   Eyes: Positive for visual disturbance (blurry).  Respiratory: Positive for shortness of breath (ongoing issue). Negative for cough.   Cardiovascular: Negative for chest pain and leg swelling.  Gastrointestinal: Positive for nausea (yesterday), vomiting (yesterday), abdominal pain (cramps) and diarrhea. Negative for blood in stool.  Genitourinary: Negative for dysuria.  Musculoskeletal: Positive for myalgias (today), back pain (chronic) and arthralgias (chronic bilateral knee pain).  Skin: Negative for rash.  Neurological: Positive for dizziness and headaches.  Hematological: Does not bruise/bleed easily.  Psychiatric/Behavioral: Negative for confusion.  All other systems reviewed and are negative.    Allergies  Aspirin and Sulfonamide derivatives  Home Medications   Current Outpatient Rx  Name  Route  Sig  Dispense  Refill  . albuterol (PROVENTIL HFA;VENTOLIN HFA) 108 (90 BASE) MCG/ACT inhaler   Inhalation   Inhale 2 puffs into the lungs every 4 (four) hours as needed.          . ALPRAZolam (XANAX) 1 MG tablet   Oral   Take 1 mg by mouth 2 (two) times daily as needed for anxiety.         . Azelastine HCl 0.15 % SOLN   Nasal   Place 1 spray into the nose daily.         . beclomethasone (QVAR) 40 MCG/ACT inhaler   Inhalation   Inhale 2 puffs into the lungs 2 (two) times daily. 2 puffs two times a day   1 Inhaler   3   . cetirizine (ZYRTEC) 10 MG tablet   Oral   Take 10 mg by mouth daily.         . Cyanocobalamin (VITAMIN B-12 IJ)   Injection   Inject as directed every 30 (thirty) days.         . Darunavir Ethanolate  (PREZISTA) 800 MG tablet   Oral   Take 1 tablet (800 mg total) by mouth daily with breakfast.   30 tablet   6   . diclofenac (VOLTAREN) 75 MG EC tablet  Oral   Take 75 mg by mouth 2 (two) times daily.         . diphenoxylate-atropine (LOMOTIL) 2.5-0.025 MG per tablet   Oral   Take 1 tablet by mouth 4 (four) times daily as needed for diarrhea or loose stools.         Marland Kitchen emtricitabine-tenofovir (TRUVADA) 200-300 MG per tablet   Oral   Take 1 tablet by mouth daily.   90 tablet   4   . esomeprazole (NEXIUM) 40 MG capsule   Oral   Take 1 capsule (40 mg total) by mouth daily before breakfast. Take one capsule by mouth once a day   90 capsule   1   . HYDROcodone-acetaminophen (NORCO) 7.5-325 MG per tablet   Oral   Take 1 tablet by mouth Every 4 hours as needed for pain.          Marland Kitchen imiquimod (ALDARA) 5 % cream      Apply to affected area at bedtime, and wash off after 6 to 10 hours   24 each   0   . loperamide (IMODIUM) 2 MG capsule   Oral   Take 2 mg by mouth 4 (four) times daily as needed for diarrhea or loose stools.         . methocarbamol (ROBAXIN) 750 MG tablet   Oral   Take 1 tablet (750 mg total) by mouth 4 (four) times daily as needed. For muscle spasms   75 tablet   1   . ondansetron (ZOFRAN) 4 MG tablet   Oral   Take 1 tablet (4 mg total) by mouth every 8 (eight) hours as needed for nausea.   6 tablet   0   . oxyCODONE-acetaminophen (PERCOCET) 7.5-325 MG per tablet   Oral   Take 1 tablet by mouth every 8 (eight) hours as needed for pain.          . potassium chloride (K-DUR) 10 MEQ tablet   Oral   Take 10 mEq by mouth daily.         . predniSONE (DELTASONE) 20 MG tablet   Oral   Take 20 mg by mouth daily with breakfast.         . promethazine (PHENERGAN) 25 MG tablet   Oral   Take 25 mg by mouth every 6 (six) hours as needed for nausea.         . raltegravir (ISENTRESS) 400 MG tablet   Oral   Take 1 tablet (400 mg total) by  mouth daily.   90 tablet   4   . ritonavir (NORVIR) 100 MG capsule   Oral   Take 1 capsule (100 mg total) by mouth daily.   90 capsule   4   . verapamil (CALAN-SR) 240 MG CR tablet   Oral   Take 1 tablet (240 mg total) by mouth daily.   30 tablet   1   . Vitamin D, Ergocalciferol, (DRISDOL) 50000 UNITS CAPS   Oral   Take 50,000 Units by mouth every 7 (seven) days. Take on mondays         . zolpidem (AMBIEN) 5 MG tablet   Oral   Take 5 mg by mouth at bedtime as needed for sleep.           Triage Vitals: BP 88/52  Pulse 80  Temp(Src) 98.5 F (36.9 C) (Oral)  Ht 5\' 4"  (1.626 m)  Wt 175 lb (79.379 kg)  BMI 30.02 kg/m2  SpO2 88%  Physical Exam  Nursing note and vitals reviewed. Constitutional: She is oriented to person, place, and time. She appears well-developed and well-nourished. No distress.  HENT:  Head: Normocephalic and atraumatic.  Dry MM  Eyes: Conjunctivae and EOM are normal. Pupils are equal, round, and reactive to light.  Sclera are clear  Neck: Neck supple. No tracheal deviation present.  Cardiovascular: Normal rate and regular rhythm.   No murmur heard. Pulses:      Dorsalis pedis pulses are 2+ on the right side, and 2+ on the left side.  Pulmonary/Chest: Effort normal and breath sounds normal. No respiratory distress. She has no wheezes.  Abdominal: Soft. Bowel sounds are normal. She exhibits no distension. There is tenderness (mild amount of suprapubic tenderness).  Musculoskeletal: Normal range of motion. She exhibits no edema (no ankle swelling).  Lymphadenopathy:    She has no cervical adenopathy.  Neurological: She is alert and oriented to person, place, and time. No cranial nerve deficit.  Pt able to move both sets of fingers and toes  Skin: Skin is warm and dry. No rash noted.  Psychiatric: She has a normal mood and affect. Her behavior is normal.    ED Course   Procedures (including critical care time)  Medications  0.9 %  sodium  chloride infusion (not administered)  piperacillin-tazobactam (ZOSYN) IVPB 3.375 g (not administered)  vancomycin (VANCOCIN) IVPB 750 mg/150 ml premix (not administered)  0.9 %  sodium chloride infusion (not administered)  0.9 %  sodium chloride infusion (0 mLs Intravenous Stopped 05/29/13 1503)  sodium chloride 0.9 % bolus 2,000 mL (0 mLs Intravenous Stopped 05/29/13 1623)  ondansetron (ZOFRAN) injection 4 mg (4 mg Intravenous Given 05/29/13 1439)  piperacillin-tazobactam (ZOSYN) IVPB 3.375 g (3.375 g Intravenous New Bag/Given 05/29/13 1632)  vancomycin (VANCOCIN) IVPB 1000 mg/200 mL premix (0 mg Intravenous Stopped 05/29/13 1737)  iohexol (OMNIPAQUE) 300 MG/ML solution 50 mL (50 mLs Oral Contrast Given 05/29/13 1640)  ondansetron (ZOFRAN) injection 4 mg (4 mg Intravenous Given 05/29/13 1732)  iohexol (OMNIPAQUE) 300 MG/ML solution 100 mL (100 mLs Intravenous Contrast Given 05/29/13 1757)    DIAGNOSTIC STUDIES: Oxygen Saturation is 96% on 2L Miranda, normal by my interpretation.    COORDINATION OF CARE: 1:36 PM-Discussed treatment plan which includes medications, CXR, CBC panel, BMP and UA with pt at bedside and pt agreed to plan.   Labs Reviewed  CBC WITH DIFFERENTIAL - Abnormal; Notable for the following:    RBC 3.80 (*)    Hemoglobin 11.4 (*)    HCT 34.8 (*)    All other components within normal limits  BASIC METABOLIC PANEL - Abnormal; Notable for the following:    Glucose, Bld 101 (*)    Calcium 8.1 (*)    All other components within normal limits  HEPATIC FUNCTION PANEL - Abnormal; Notable for the following:    Albumin 2.9 (*)    Alkaline Phosphatase 152 (*)    Total Bilirubin 0.2 (*)    Indirect Bilirubin 0.1 (*)    All other components within normal limits  URINALYSIS, ROUTINE W REFLEX MICROSCOPIC - Abnormal; Notable for the following:    Hgb urine dipstick SMALL (*)    All other components within normal limits  LACTIC ACID, PLASMA - Abnormal; Notable for the following:    Lactic Acid,  Venous 5.2 (*)    All other components within normal limits  URINE MICROSCOPIC-ADD ON - Abnormal; Notable for the following:    Squamous Epithelial / LPF  FEW (*)    Bacteria, UA FEW (*)    All other components within normal limits  CULTURE, BLOOD (ROUTINE X 2)  CULTURE, BLOOD (ROUTINE X 2)  URINE CULTURE  LIPASE, BLOOD  PRO B NATRIURETIC PEPTIDE  TROPONIN I  PROCALCITONIN   Results for orders placed during the hospital encounter of 05/29/13  CBC WITH DIFFERENTIAL      Result Value Range   WBC 5.7  4.0 - 10.5 K/uL   RBC 3.80 (*) 3.87 - 5.11 MIL/uL   Hemoglobin 11.4 (*) 12.0 - 15.0 g/dL   HCT 16.1 (*) 09.6 - 04.5 %   MCV 91.6  78.0 - 100.0 fL   MCH 30.0  26.0 - 34.0 pg   MCHC 32.8  30.0 - 36.0 g/dL   RDW 40.9  81.1 - 91.4 %   Platelets 200  150 - 400 K/uL   Neutrophils Relative % 44  43 - 77 %   Lymphocytes Relative 45  12 - 46 %   Monocytes Relative 11  3 - 12 %   Eosinophils Relative 0  0 - 5 %   Basophils Relative 0  0 - 1 %   Neutro Abs 2.5  1.7 - 7.7 K/uL   Lymphs Abs 2.6  0.7 - 4.0 K/uL   Monocytes Absolute 0.6  0.1 - 1.0 K/uL   Eosinophils Absolute 0.0  0.0 - 0.7 K/uL   Basophils Absolute 0.0  0.0 - 0.1 K/uL   WBC Morphology ATYPICAL LYMPHOCYTES     Smear Review LARGE PLATELETS PRESENT    BASIC METABOLIC PANEL      Result Value Range   Sodium 137  135 - 145 mEq/L   Potassium 4.3  3.5 - 5.1 mEq/L   Chloride 100  96 - 112 mEq/L   CO2 20  19 - 32 mEq/L   Glucose, Bld 101 (*) 70 - 99 mg/dL   BUN 6  6 - 23 mg/dL   Creatinine, Ser 7.82  0.50 - 1.10 mg/dL   Calcium 8.1 (*) 8.4 - 10.5 mg/dL   GFR calc non Af Amer >90  >90 mL/min   GFR calc Af Amer >90  >90 mL/min  HEPATIC FUNCTION PANEL      Result Value Range   Total Protein 6.6  6.0 - 8.3 g/dL   Albumin 2.9 (*) 3.5 - 5.2 g/dL   AST 23  0 - 37 U/L   ALT 10  0 - 35 U/L   Alkaline Phosphatase 152 (*) 39 - 117 U/L   Total Bilirubin 0.2 (*) 0.3 - 1.2 mg/dL   Bilirubin, Direct 0.1  0.0 - 0.3 mg/dL   Indirect  Bilirubin 0.1 (*) 0.3 - 0.9 mg/dL  LIPASE, BLOOD      Result Value Range   Lipase 13  11 - 59 U/L  URINALYSIS, ROUTINE W REFLEX MICROSCOPIC      Result Value Range   Color, Urine YELLOW  YELLOW   APPearance CLEAR  CLEAR   Specific Gravity, Urine 1.010  1.005 - 1.030   pH 5.5  5.0 - 8.0   Glucose, UA NEGATIVE  NEGATIVE mg/dL   Hgb urine dipstick SMALL (*) NEGATIVE   Bilirubin Urine NEGATIVE  NEGATIVE   Ketones, ur NEGATIVE  NEGATIVE mg/dL   Protein, ur NEGATIVE  NEGATIVE mg/dL   Urobilinogen, UA 0.2  0.0 - 1.0 mg/dL   Nitrite NEGATIVE  NEGATIVE   Leukocytes, UA NEGATIVE  NEGATIVE  PRO B NATRIURETIC PEPTIDE  Result Value Range   Pro B Natriuretic peptide (BNP) 52.9  0 - 125 pg/mL  TROPONIN I      Result Value Range   Troponin I <0.30  <0.30 ng/mL  LACTIC ACID, PLASMA      Result Value Range   Lactic Acid, Venous 5.2 (*) 0.5 - 2.2 mmol/L  PROCALCITONIN      Result Value Range   Procalcitonin <0.10    URINE MICROSCOPIC-ADD ON      Result Value Range   Squamous Epithelial / LPF FEW (*) RARE   WBC, UA 0-2  <3 WBC/hpf   RBC / HPF 0-2  <3 RBC/hpf   Bacteria, UA FEW (*) RARE    Date: 05/29/2013  Rate: 81  Rhythm: normal sinus rhythm  QRS Axis: normal  Intervals: normal  ST/T Wave abnormalities: normal  Conduction Disutrbances:none  Narrative Interpretation:   Old EKG Reviewed: none available    Ct Abdomen Pelvis W Contrast  05/29/2013   *RADIOLOGY REPORT*  Clinical Data: Suprapubic pain.  CT ABDOMEN AND PELVIS WITH CONTRAST  Technique:  Multidetector CT imaging of the abdomen and pelvis was performed following the standard protocol during bolus administration of intravenous contrast.  Contrast: 50mL OMNIPAQUE IOHEXOL 300 MG/ML  SOLN, OMNIPAQUE IOHEXOL 300 MG/ML  SOLN  Comparison: None.  Findings: Moderate sized hiatal hernia.  Subsegmental atelectasis or scar in the lingula.  Post cholecystectomy  Diffuse hepatic steatosis.  Spleen, adrenal glands are within normal  limits.  Chronic changes of the kidneys are stable.  Prominent pancreatic calcifications and pancreatic atrophy are present compatible with stigmata of chronic pancreatitis.  No obvious acute inflammatory changes.  Advanced bilateral femoral head AVN.  Bladder is distended.  No obvious bowel wall thickening to suggest an inflammatory process.  No free fluid.  No abnormal retroperitoneal adenopathy.  IMPRESSION: No acute intra-abdominal process.  Chronic change.   Original Report Authenticated By: Jolaine Click, M.D.   Dg Chest Port 1 View  05/29/2013   *RADIOLOGY REPORT*  Clinical Data: 52 year old female with shortness of breath, hypertension.  PORTABLE CHEST - 1 VIEW  Comparison: 05/08/2013 and earlier.  Findings: Portable AP upright view at 1416 hours.  Increased pulmonary vascularity.  Increased retrocardiac opacity.  No pneumothorax or definite effusion.  Stable cardiac size and mediastinal contours.  Visualized tracheal air column is within normal limits.  Stable visible proximal right humerus hardware.  IMPRESSION: Increased pulmonary vascular congestion and new left greater than right lower lobe collapse or consolidation.   Original Report Authenticated By: Erskine Speed, M.D.   1. Hypotension   2. Sepsis   3. CAP (community acquired pneumonia)   4. HIV (human immunodeficiency virus infection)       CRITICAL CARE Performed by: Shelda Jakes. Total critical care time: 30 Critical care time was exclusive of separately billable procedures and treating other patients. Critical care was necessary to treat or prevent imminent or life-threatening deterioration. Critical care was time spent personally by me on the following activities: development of treatment plan with patient and/or surrogate as well as nursing, discussions with consultants, evaluation of patient's response to treatment, examination of patient, obtaining history from patient or surrogate, ordering and performing treatments and  interventions, ordering and review of laboratory studies, ordering and review of radiographic studies, pulse oximetry and re-evaluation of patient's condition.  MDM  Patient with HIV patient presented with hypotension clinical picture consistent with sepsis. No fever no significant leukocytosis but lactic acid was elevated. Patient responded extremely  well to 2 L of normal saline fluid bring her blood pressure up above 90 and maintaining that they're currently it is 103 systolic. Patient's mentation has been fine. Patient did have an oxygen requirement on arrival 88% on room as been on 2 L since her oxygenation is currently 97-99% on 2 L. Patient was treated with broad-spectrum antibiotics for the sepsis chest x-ray raises bilateral consolidation concerns PCP is a possibility however patient is allergic to sulfur meds so Septra not given. Patient will be admitted to the ICU here under hospitalist team 1. Patient as stated has improved significantly with the treatments that have been provided in the emergency department. Patient was treated with Zosyn and vancomycin. Patient is on antivirals for HIV.  I personally performed the services described in this documentation, which was scribed in my presence. The recorded information has been reviewed and is accurate.       Shelda Jakes, MD 05/29/13 [phone]

## 2013-05-29 NOTE — ED Notes (Signed)
Attempted placement of foley, but patient unable to tolerate dropping legs apart d/t knee pain.  Also, during insertion attempt, was unable to pass cathter once in urethral opening.

## 2013-05-29 NOTE — Progress Notes (Addendum)
ANTIBIOTIC CONSULT NOTE - INITIAL  Pharmacy Consult for Vancomycin & Zosyn Indication: Sepsis  Allergies  Allergen Reactions  . Aspirin Nausea And Vomiting  . Sulfonamide Derivatives Hives    Patient Measurements: Height: 5\' 4"  (162.6 cm) Weight: 175 lb (79.379 kg) IBW/kg (Calculated) : 54.7   Vital Signs: Temp: 98.5 F (36.9 C) (08/08 1301) Temp src: Oral (08/08 1301) BP: 93/58 mmHg (08/08 1537) Pulse Rate: 88 (08/08 1537) Intake/Output from previous day:   Intake/Output from this shift:    Labs:  Recent Labs  05/29/13 1442  WBC 5.7  HGB 11.4*  PLT 200  CREATININE 0.68   Estimated Creatinine Clearance: 85.8 ml/min (by C-G formula based on Cr of 0.68). No results found for this basename: VANCOTROUGH, Leodis Binet, VANCORANDOM, GENTTROUGH, GENTPEAK, GENTRANDOM, TOBRATROUGH, TOBRAPEAK, TOBRARND, AMIKACINPEAK, AMIKACINTROU, AMIKACIN,  in the last 72 hours   Microbiology: Recent Results (from the past 720 hour(s))  RAPID STREP SCREEN     Status: None   Collection Time    05/08/13  3:20 PM      Result Value Range Status   Streptococcus, Group A Screen (Direct) NEGATIVE  NEGATIVE Final   Comment: (NOTE)     A Rapid Antigen test may result negative if the antigen level in the     sample is below the detection level of this test. The FDA has not     cleared this test as a stand-alone test therefore the rapid antigen     negative result has reflexed to a Group A Strep culture.  CULTURE, GROUP A STREP     Status: None   Collection Time    05/08/13  3:20 PM      Result Value Range Status   Specimen Description THROAT   Final   Special Requests NONE   Final   Culture No Beta Hemolytic Streptococci Isolated   Final   Report Status 05/10/2013 FINAL   Final    Medical History: Past Medical History  Diagnosis Date  . Baker's cyst   . Pulmonary lesion 1/03    on RUL onn CT   . Hemorrhoids   . Zoster 2001  . Family history of diabetes mellitus   . Tobacco user   .  Other abnormal Papanicolaou smear of cervix and cervical HPV(795.09)   . Herpes zoster   . Hemorrhoids   . Baker's cyst   . Pancreatitis   . HPV (human papilloma virus) infection   . HIV disease     take Prezista,Truvada,Norvir,and Isentress daily  . Bipolar disorder     With hx of psychotic features.  . Articular cartilage disorder of knee   . Bursitis of shoulder   . Rotator cuff arthropathy   . Hypertension     takes Clonidine and Verapamil daily  . Emphysema   . Asthma     Albuterol resue inhaler and QVAR daily  . Shortness of breath     with exertion and stress  . Pneumonia     hx of;last time 1 1/83yrs ago  . Headache(784.0)   . Dizziness   . Arthritis   . Joint pain   . Joint swelling   . Chronic back pain     buldging disc  . Bruises easily   . Gastric ulcer   . GERD (gastroesophageal reflux disease)     takes Nexium daily  . Chronic constipation   . Urinary frequency   . Urinary incontinence   . Nocturia   . History of blood  transfusion at age 54  . Early cataracts, bilateral   . Anxiety   . Depression     takes Xanax daily and Depakote bid  . History of shingles   . 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 Tobacco abuse 10/26/2012  . HIV (human immunodeficiency virus infection)   . Chronic back pain   . Chronic knee pain     Medications:  Scheduled:    Assessment: Vancomycin 1 GM and Zosyn 3.375 GM, first doses in ED for sepsis CrCl 85.8 ml/min  Goal of Therapy:  Vancomycin trough level 15-20 mcg/ml  Plan:  Zosyn 3.375 GM IV every 8 hours, infuse each dose over 4 hours, after ED dose Vancomycin 750 mg IV every 8 hours, after ED 1 GM dose Vancomycin trough at steady state Monitor renal function Labs per protocol  Josephine Igo 05/29/2013,4:04 PM

## 2013-05-29 NOTE — ED Notes (Signed)
Vomited up clear lt. Orange fluid.

## 2013-05-29 NOTE — Progress Notes (Signed)
Patient aided to the toilet.

## 2013-05-29 NOTE — H&P (Signed)
[facility] History and Physical  [name]  UXL:[id number]  DOB: 1961-05-28   DOA: 05/29/2013   PCP:   Lonia Blood, MD   Chief Complaint:  Feeling weak and dizzy since today  HPI: [name] is a 52 y.o. female.   Obese African American lady with multiple medical problems including,  HIV disease, CD4 count greater than 1000 a few months ago, also hypertension and chronic pain, has been feeling sick for the past few days. She had an episode of diarrhea from Tuesday night to Wednesday. She's been having sinus pressure and sore throat for about a week. She has been episodically feeling feverish with chills. Went to her dentist office today, but on arrival felt weak lightheaded with blurred vision. He checked her and found that her blood pressure is very low and sent her to the emergency room.  In the emergency room as well as hypotension, she was found to have an elevated lactic acid and abnormal chest x-ray.  She was afebrile and had no white count. Subsequent CT scan of the abdomen did not confirm the abnormality seen on chest x-ray.  She improved dramatically with a 2 L saline bolus in the emergency room  She is a chronic smoker She reports she's been taking her medications exactly as prescribed Rewiew of Systems:   All systems negative except as marked bold or noted in the HPI;  Constitutional:    malaise, fever and chills. ;  Eyes:   eye pain, redness and discharge. ;  ENMT:   ear pain, hoarseness, nasal congestion, . ;  Cardiovascular:    chest pain, palpitations, diaphoresis, dyspnea and peripheral edema.  Respiratory:   cough, hemoptysis, wheezing and stridor. ;  Gastrointestinal:  nausea, , constipation, abdominal pain, melena, blood in stool, hematemesis, jaundice and rectal bleeding. unusual weight loss..   Genitourinary:    frequency, dysuria, incontinence,flank pain and hematuria; Musculoskeletal:   back pain and neck pain.  swelling and trauma.;  Skin: .   pruritus, rash, abrasions, bruising and skin lesion.; ulcerations Neuro:    headache, lightheadedness andtremity weakness, burning feet, involuntary movement, seizure and syncope.  Psych:    anxiety, depression, insomnia, tearfulness, panic attacks, hallucinations, paranoia, suicidal or homicidal ideation    Past Medical History  Diagnosis Date  . Baker's cyst   . Pulmonary lesion 1/03    on RUL onn CT   . Hemorrhoids   . Zoster 2001  . Family history of diabetes mellitus   . Tobacco user   . Other abnormal Papanicolaou smear of cervix and cervical HPV(795.09)   . Herpes zoster   . Hemorrhoids   . Baker's cyst   . Pancreatitis   . HPV (human papilloma virus) infection   . HIV disease     take Prezista,Truvada,Norvir,and Isentress daily  . Bipolar disorder     With hx of psychotic features.  . Articular cartilage disorder of knee   . Bursitis of shoulder   . Rotator cuff arthropathy   . Hypertension     takes Clonidine and Verapamil daily  . Emphysema   . Asthma     Albuterol resue inhaler and QVAR daily  . Shortness of breath     with exertion and stress  . Pneumonia     hx of;last time 1 1/40yrs ago  . Headache(784.0)   . Dizziness   . Arthritis   . Joint pain   . Joint swelling   . Chronic back pain  buldging disc  . Bruises easily   . Gastric ulcer   . GERD (gastroesophageal reflux disease)     takes Nexium daily  . Chronic constipation   . Urinary frequency   . Urinary incontinence   . Nocturia   . History of blood transfusion at age 52  . Early cataracts, bilateral   . Anxiety   . Depression     takes Xanax daily and Depakote bid  . History of shingles   . 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 Tobacco abuse 10/26/2012  . HIV (human immunodeficiency virus infection)   . Chronic back pain   . Chronic knee pain     Past Surgical History  Procedure Laterality Date  . Right breast lumpectomy    . Total  abdominal hysterectomy w/ bilateral salpingoophorectomy  at age 29  . Breast surgery  as a teenager    rt breast biopsy  . Cholecystectomy  10+yrs ago  . Knee arthroscopy      bilateral  . Appendectomy      with hysterectomy  . Finger surgery      rt middle finger with pin in it  . Epidural injections    . Esophagogastroduodenoscopy    . Shoulder hemi-arthroplasty  05/06/2012    Procedure: SHOULDER HEMI-ARTHROPLASTY;  Surgeon: Eulas Post, MD;  Location: [facility] OR;  Service: Orthopedics;  Laterality: Right;  . Abdominal hysterectomy    . Joint replacement      Medications:  HOME MEDS: Prior to Admission medications   Medication Sig Start Date End Date Taking? Authorizing Provider  albuterol (PROVENTIL HFA;VENTOLIN HFA) 108 (90 BASE) MCG/ACT inhaler Inhale 2 puffs into the lungs every 4 (four) hours as needed.  12/31/11  Yes Kerri Perches, MD  ALPRAZolam Prudy Feeler) 1 MG tablet Take 1 mg by mouth 2 (two) times daily as needed for anxiety.   Yes Historical Provider, MD  Azelastine HCl 0.15 % SOLN Place 1 spray into the nose daily.   Yes Historical Provider, MD  beclomethasone (QVAR) 40 MCG/ACT inhaler Inhale 2 puffs into the lungs 2 (two) times daily. 2 puffs two times a day 12/31/11  Yes Kerri Perches, MD  cetirizine (ZYRTEC) 10 MG tablet Take 10 mg by mouth daily.   Yes Historical Provider, MD  Cyanocobalamin (VITAMIN B-12 IJ) Inject as directed every 30 (thirty) days.   Yes Historical Provider, MD  Darunavir Ethanolate (PREZISTA) 800 MG tablet Take 1 tablet (800 mg total) by mouth daily with breakfast. 12/19/12  Yes Ginnie Smart, MD  diclofenac (VOLTAREN) 75 MG EC tablet Take 75 mg by mouth 2 (two) times daily.   Yes Historical Provider, MD  diphenoxylate-atropine (LOMOTIL) 2.5-0.025 MG per tablet Take 1 tablet by mouth 4 (four) times daily as needed for diarrhea or loose stools.   Yes Historical Provider, MD  emtricitabine-tenofovir (TRUVADA) 200-300 MG per tablet Take 1  tablet by mouth daily. 03/18/13  Yes Ginnie Smart, MD  esomeprazole (NEXIUM) 40 MG capsule Take 1 capsule (40 mg total) by mouth daily before breakfast. Take one capsule by mouth once a day 05/02/11  Yes Kerri Perches, MD  HYDROcodone-acetaminophen (NORCO) 7.5-325 MG per tablet Take 1 tablet by mouth Every 4 hours as needed for pain.  07/14/12  Yes Historical Provider, MD  imiquimod Mathis Dad) 5 % cream Apply to affected area at bedtime, and wash off after 6 to 10 hours 04/06/13 04/06/14 Yes Ginnie Smart, MD  loperamide (IMODIUM)  2 MG capsule Take 2 mg by mouth 4 (four) times daily as needed for diarrhea or loose stools.   Yes Historical Provider, MD  methocarbamol (ROBAXIN) 750 MG tablet Take 1 tablet (750 mg total) by mouth 4 (four) times daily as needed. For muscle spasms 05/08/12  Yes Eulas Post, MD  ondansetron (ZOFRAN) 4 MG tablet Take 1 tablet (4 mg total) by mouth every 8 (eight) hours as needed for nausea. 05/08/13  Yes Laray Anger, DO  oxyCODONE-acetaminophen (PERCOCET) 7.5-325 MG per tablet Take 1 tablet by mouth every 8 (eight) hours as needed for pain.  03/11/13  Yes Historical Provider, MD  potassium chloride (K-DUR) 10 MEQ tablet Take 10 mEq by mouth daily.   Yes Historical Provider, MD  predniSONE (DELTASONE) 20 MG tablet Take 20 mg by mouth daily with breakfast. 10/27/12  Yes Tora Kindred York, PA-C  promethazine (PHENERGAN) 25 MG tablet Take 25 mg by mouth every 6 (six) hours as needed for nausea.   Yes Historical Provider, MD  raltegravir (ISENTRESS) 400 MG tablet Take 1 tablet (400 mg total) by mouth daily. 03/18/13  Yes Ginnie Smart, MD  ritonavir (NORVIR) 100 MG capsule Take 1 capsule (100 mg total) by mouth daily. 03/18/13  Yes Ginnie Smart, MD  verapamil (CALAN-SR) 240 MG CR tablet Take 1 tablet (240 mg total) by mouth daily. 12/15/12  Yes Ginnie Smart, MD  Vitamin D, Ergocalciferol, (DRISDOL) 50000 UNITS CAPS Take 50,000 Units by mouth every 7 (seven)  days. Take on mondays   Yes Historical Provider, MD  zolpidem (AMBIEN) 5 MG tablet Take 5 mg by mouth at bedtime as needed for sleep.    Yes Historical Provider, MD     Allergies:  Allergies  Allergen Reactions  . Aspirin Nausea And Vomiting  . Sulfonamide Derivatives Hives    Social History:   reports that she has been smoking Cigarettes.  She has a 20 pack-year smoking history. She has never used smokeless tobacco. She reports that she drinks about 0.5 ounces of alcohol per week. She reports that she uses illicit drugs (Marijuana).  Family History: Family History  Problem Relation Age of Onset  . Diabetes      Family History  . Hypertension      Family history   . Hypertension Mother   . Diabetes Mother   . Diabetes Father   . Cancer Sister 58    x2 (liver)  . Diabetes Brother      Physical Exam: Filed Vitals:   05/29/13 1840 05/29/13 1900 05/29/13 1953 05/29/13 2110  BP: 98/69 103/74 101/68 101/73  Pulse: 78 78 79 81  Temp:   98.9 F (37.2 C) 97.9 F (36.6 C)  TempSrc:   Oral Oral  Resp: 16 15 22    Height:    5\' 4"  (1.626 m)  Weight:    85.9 kg (189 lb 6 oz)  SpO2: 97% 97% 98% 98%   Blood pressure 101/73, pulse 81, temperature 97.9 F (36.6 C), temperature source Oral, resp. rate 22, height 5\' 4"  (1.626 m), weight 85.9 kg (189 lb 6 oz), SpO2 98.00%. Body mass index is 32.49 kg/(m^2).   GEN:  Pleasant middle-aged African American lady lying bed in no acute distress; cooperative with exam, but somewhat lethargic PSYCH:  alert and oriented x4; neither anxious nor depressed; affect is appropriate. HEENT: Mucous membranes pink and anicteric; PERRLA; EOM intact; tender submandibular glands, no thyromegaly or carotid bruit; no JVD; Breasts:: Not examined CHEST  WALL: No tenderness CHEST: Normal respiration, clear to auscultation bilaterally HEART: Regular rate and rhythm; no murmurs rubs or gallops BACK: Bilateral CVA tenderness right greater than left ABDOMEN:  Obese, soft non-tender; no masses, no organomegaly, normal abdominal bowel sounds; small pannus; no intertriginous candida. Rectal Exam: Not done EXTREMITIES: Arthritic deformities of both knees; no edema; no ulcerations. Genitalia: not examined PULSES: 2+ and symmetric SKIN: Normal hydration no rash or ulceration CNS: Cranial nerves 2-12 grossly intact no focal lateralizing neurologic deficit   Labs on Admission:  Basic Metabolic Panel:  Recent Labs Lab 05/29/13 1442  NA 137  K 4.3  CL 100  CO2 20  GLUCOSE 101*  BUN 6  CREATININE 0.68  CALCIUM 8.1*   Liver Function Tests:  Recent Labs Lab 05/29/13 1451  AST 23  ALT 10  ALKPHOS 152*  BILITOT 0.2*  PROT 6.6  ALBUMIN 2.9*    Recent Labs Lab 05/29/13 1451  LIPASE 13   No results found for this basename: AMMONIA,  in the last 168 hours CBC:  Recent Labs Lab 05/29/13 1442  WBC 5.7  NEUTROABS 2.5  HGB 11.4*  HCT 34.8*  MCV 91.6  PLT 200   Cardiac Enzymes:  Recent Labs Lab 05/29/13 1451  TROPONINI <0.30   BNP: No components found with this basename: POCBNP,  D-dimer: No components found with this basename: D-DIMER,  CBG: No results found for this basename: GLUCAP,  in the last 168 hours  Radiological Exams on Admission: Ct Abdomen Pelvis W Contrast  05/29/2013   *RADIOLOGY REPORT*  Clinical Data: Suprapubic pain.  CT ABDOMEN AND PELVIS WITH CONTRAST  Technique:  Multidetector CT imaging of the abdomen and pelvis was performed following the standard protocol during bolus administration of intravenous contrast.  Contrast: 50mL OMNIPAQUE IOHEXOL 300 MG/ML  SOLN, OMNIPAQUE IOHEXOL 300 MG/ML  SOLN  Comparison: None.  Findings: Moderate sized hiatal hernia.  Subsegmental atelectasis or scar in the lingula.  Post cholecystectomy  Diffuse hepatic steatosis.  Spleen, adrenal glands are within normal limits.  Chronic changes of the kidneys are stable.  Prominent pancreatic calcifications and pancreatic  atrophy are present compatible with stigmata of chronic pancreatitis.  No obvious acute inflammatory changes.  Advanced bilateral femoral head AVN.  Bladder is distended.  No obvious bowel wall thickening to suggest an inflammatory process.  No free fluid.  No abnormal retroperitoneal adenopathy.  IMPRESSION: No acute intra-abdominal process.  Chronic change.   Original Report Authenticated By: Jolaine Click, M.D.   Dg Chest Port 1 View  05/29/2013   *RADIOLOGY REPORT*  Clinical Data: 52 year old female with shortness of breath, hypertension.  PORTABLE CHEST - 1 VIEW  Comparison: 05/08/2013 and earlier.  Findings: Portable AP upright view at 1416 hours.  Increased pulmonary vascularity.  Increased retrocardiac opacity.  No pneumothorax or definite effusion.  Stable cardiac size and mediastinal contours.  Visualized tracheal air column is within normal limits.  Stable visible proximal right humerus hardware.  IMPRESSION: Increased pulmonary vascular congestion and new left greater than right lower lobe collapse or consolidation.   Original Report Authenticated By: Erskine Speed, M.D.      Assessment/Plan    Active Problems:   Human immunodeficiency virus (HIV) disease   Obesity, unspecified   Difficulty in walking(719.7)   Tobacco abuse   Hypotension, unspecified, probably secondary to dehydration from recent illness   Dehydration   Primary lactic acidemia   Osteoarthritis of both knees   PLAN: Will discontinue antibiotic Hydrate vigorously; recheck  lactic acid after hydration Monitor for development of overt signs of infection Continue anti-HIV meds  Other plans as per orders.  Code Status:  Family Communication:  Plans discuss with patient  Disposition Plan:   Likely home in 2 or 3 days  Critical care time: 60 minutes.   Linnae Rasool Nocturnist [facility] Pager [phone]   05/29/2013, 9:37 PM

## 2013-05-29 NOTE — Progress Notes (Signed)
Late Entry: 2110 Patient assisted with move from stretcher to inpatient bed. -at the patient's request  Patient is alert and answers questions appropriately. Patient reports being cold and provided an extra blanket.  Patient attached to the cardiac, BP, and SaO2 monitors on arrival.  CBG bathed completed at this time as well. No breakdown noted to the posterior of the patient.

## 2013-05-29 NOTE — ED Notes (Signed)
Went to dentist today and found to be hypotensive w/dizziness, sats at 88% on RA.  CBG 177.  Had had diarrhea and abdominal cramping since yesterday.

## 2013-05-30 ENCOUNTER — Encounter (HOSPITAL_COMMUNITY): Payer: Self-pay | Admitting: Internal Medicine

## 2013-05-30 DIAGNOSIS — E872 Acidosis, unspecified: Secondary | ICD-10-CM | POA: Diagnosis present

## 2013-05-30 DIAGNOSIS — J189 Pneumonia, unspecified organism: Secondary | ICD-10-CM

## 2013-05-30 DIAGNOSIS — E860 Dehydration: Secondary | ICD-10-CM | POA: Diagnosis present

## 2013-05-30 DIAGNOSIS — J9601 Acute respiratory failure with hypoxia: Secondary | ICD-10-CM

## 2013-05-30 DIAGNOSIS — J9600 Acute respiratory failure, unspecified whether with hypoxia or hypercapnia: Secondary | ICD-10-CM

## 2013-05-30 DIAGNOSIS — I959 Hypotension, unspecified: Secondary | ICD-10-CM | POA: Diagnosis present

## 2013-05-30 DIAGNOSIS — M170 Bilateral primary osteoarthritis of knee: Secondary | ICD-10-CM | POA: Diagnosis present

## 2013-05-30 LAB — COMPREHENSIVE METABOLIC PANEL
ALT: 9 U/L (ref 0–35)
Albumin: 2.6 g/dL — ABNORMAL LOW (ref 3.5–5.2)
Alkaline Phosphatase: 135 U/L — ABNORMAL HIGH (ref 39–117)
BUN: <3 mg/dL — ABNORMAL LOW (ref 6–23)
Chloride: 104 mEq/L (ref 96–112)
GFR calc Af Amer: >90 mL/min (ref >90)
Glucose, Bld: 76 mg/dL (ref 70–99)
Potassium: 4.2 mEq/L (ref 3.5–5.1)
Sodium: 138 mEq/L (ref 135–145)
Total Bilirubin: 0.4 mg/dL (ref 0.3–1.2)
Total Protein: 5.9 g/dL — ABNORMAL LOW (ref 6.0–8.3)

## 2013-05-30 LAB — CBC
HCT: 31.6 % — ABNORMAL LOW (ref 36.0–46.0)
Hemoglobin: 10.5 g/dL — ABNORMAL LOW (ref 12.0–15.0)
MCHC: 33.2 g/dL (ref 30.0–36.0)
WBC: 5.3 10*3/uL (ref 4.0–10.5)

## 2013-05-30 LAB — LACTIC ACID, PLASMA: Lactic Acid, Venous: 3 mmol/L — ABNORMAL HIGH (ref 0.5–2.2)

## 2013-05-30 MED ORDER — RALTEGRAVIR POTASSIUM 400 MG PO TABS
400.0000 mg | ORAL_TABLET | Freq: Every day | ORAL | Status: DC
  Administered 2013-05-30 – 2013-05-31 (×2): 400 mg via ORAL
  Filled 2013-05-30 (×3): qty 1

## 2013-05-30 MED ORDER — EMTRICITABINE-TENOFOVIR DF 200-300 MG PO TABS
1.0000 | ORAL_TABLET | Freq: Every day | ORAL | Status: DC
  Administered 2013-05-30 – 2013-05-31 (×2): 1 via ORAL
  Filled 2013-05-30 (×3): qty 1

## 2013-05-30 MED ORDER — TRAZODONE HCL 50 MG PO TABS
50.0000 mg | ORAL_TABLET | Freq: Every evening | ORAL | Status: DC | PRN
Start: 2013-05-30 — End: 2013-05-31
  Administered 2013-05-30: 50 mg via ORAL
  Filled 2013-05-30: qty 1

## 2013-05-30 MED ORDER — ONDANSETRON HCL 4 MG/2ML IJ SOLN
4.0000 mg | INTRAMUSCULAR | Status: DC | PRN
  Administered 2013-05-30 – 2013-05-31 (×4): 4 mg via INTRAVENOUS
  Filled 2013-05-30 (×4): qty 2

## 2013-05-30 MED ORDER — ACETAMINOPHEN 325 MG PO TABS
650.0000 mg | ORAL_TABLET | ORAL | Status: DC | PRN
  Administered 2013-05-30: 650 mg via ORAL
  Filled 2013-05-30: qty 2

## 2013-05-30 MED ORDER — OXYCODONE HCL 5 MG PO TABS
5.0000 mg | ORAL_TABLET | ORAL | Status: DC | PRN
  Administered 2013-05-30 – 2013-05-31 (×4): 5 mg via ORAL
  Filled 2013-05-30 (×4): qty 1

## 2013-05-30 MED ORDER — DICLOFENAC SODIUM 75 MG PO TBEC
75.0000 mg | DELAYED_RELEASE_TABLET | Freq: Two times a day (BID) | ORAL | Status: DC
  Administered 2013-05-30 – 2013-05-31 (×4): 75 mg via ORAL
  Filled 2013-05-30 (×4): qty 1

## 2013-05-30 MED ORDER — ALPRAZOLAM 0.5 MG PO TABS
1.0000 mg | ORAL_TABLET | Freq: Two times a day (BID) | ORAL | Status: DC | PRN
  Administered 2013-05-30: 1 mg via ORAL
  Filled 2013-05-30: qty 2

## 2013-05-30 MED ORDER — DARUNAVIR ETHANOLATE 800 MG PO TABS
800.0000 mg | ORAL_TABLET | Freq: Every day | ORAL | Status: DC
  Filled 2013-05-30 (×3): qty 1

## 2013-05-30 MED ORDER — FLEET ENEMA 7-19 GM/118ML RE ENEM: 1.0000 | ENEMA | Freq: Once | RECTAL | Status: AC | PRN

## 2013-05-30 MED ORDER — PANTOPRAZOLE SODIUM 40 MG PO TBEC
80.0000 mg | DELAYED_RELEASE_TABLET | Freq: Two times a day (BID) | ORAL | Status: DC
  Administered 2013-05-30 – 2013-05-31 (×4): 80 mg via ORAL
  Filled 2013-05-30 (×4): qty 2

## 2013-05-30 MED ORDER — POTASSIUM CHLORIDE IN NACL 20-0.9 MEQ/L-% IV SOLN
INTRAVENOUS | Status: DC
  Administered 2013-05-30 (×2): via INTRAVENOUS

## 2013-05-30 MED ORDER — ENOXAPARIN SODIUM 40 MG/0.4ML ~~LOC~~ SOLN
40.0000 mg | SUBCUTANEOUS | Status: DC
  Administered 2013-05-30 – 2013-05-31 (×2): 40 mg via SUBCUTANEOUS
  Filled 2013-05-30 (×2): qty 0.4

## 2013-05-30 MED ORDER — DARUNAVIR ETHANOLATE 800 MG PO TABS
800.0000 mg | ORAL_TABLET | Freq: Every day | ORAL | Status: DC
  Administered 2013-05-30 – 2013-05-31 (×2): 800 mg via ORAL
  Filled 2013-05-30 (×3): qty 1

## 2013-05-30 MED ORDER — RITONAVIR 100 MG PO CAPS
100.0000 mg | ORAL_CAPSULE | Freq: Every day | ORAL | Status: DC
  Administered 2013-05-30 – 2013-05-31 (×2): 100 mg via ORAL
  Filled 2013-05-30 (×3): qty 1

## 2013-05-30 MED ORDER — PREDNISONE 20 MG PO TABS
20.0000 mg | ORAL_TABLET | Freq: Every day | ORAL | Status: DC
  Administered 2013-05-30 – 2013-05-31 (×2): 20 mg via ORAL
  Filled 2013-05-30 (×2): qty 1

## 2013-05-30 MED ORDER — HYDROMORPHONE HCL PF 1 MG/ML IJ SOLN: 0.5000 mg | INTRAMUSCULAR | Status: DC | PRN

## 2013-05-30 MED ORDER — ALBUTEROL SULFATE HFA 108 (90 BASE) MCG/ACT IN AERS
2.0000 | INHALATION_SPRAY | RESPIRATORY_TRACT | Status: DC | PRN
  Administered 2013-05-31: 2 via RESPIRATORY_TRACT
  Filled 2013-05-30: qty 6.7

## 2013-05-30 MED ORDER — AZITHROMYCIN 250 MG PO TABS
500.0000 mg | ORAL_TABLET | Freq: Every day | ORAL | Status: AC
  Administered 2013-05-30: 500 mg via ORAL
  Filled 2013-05-30: qty 2

## 2013-05-30 MED ORDER — DEXTROSE 5 % IV SOLN
1.0000 g | INTRAVENOUS | Status: DC
  Administered 2013-05-30: 1 g via INTRAVENOUS
  Filled 2013-05-30 (×3): qty 10

## 2013-05-30 MED ORDER — AZITHROMYCIN 250 MG PO TABS
250.0000 mg | ORAL_TABLET | Freq: Every day | ORAL | Status: DC
  Administered 2013-05-31: 250 mg via ORAL
  Filled 2013-05-30: qty 1

## 2013-05-30 NOTE — Progress Notes (Signed)
[facility] PROGRESS NOTE  CHEVY VIRGO ZOX:[id number] DOB: 1961-03-18 DOA: 05/29/2013 PCP: Lonia Blood, MD Infectious disease specialist: Dr. Ninetta Lights  Assessment/Plan: 1. Suspected community acquired pneumonia: Appears to be improving. We will change to Rocephin/Zithromax. History and imaging does not suggest PCP. Last CD4 count 796/2014. At that time viral load was undetectable. 2. Acute hypoxic respiratory failure: Appears resolved at this point. Presumably secondary to pneumonia. 3. Hypotension, possible sepsis on admission: Resolved with aggressive IV fluids. Possibly complicated by diarrhea and volume loss at home. 4. Nausea, vomiting, diarrhea: Issues appear to be clinically resolving. CT of the abdomen and pelvis unremarkable. Exam benign. Monitor clinically. 5. HIV: Appears stable. Continue chronic regimen. 6. Bipolar disorder, anxiety, depression: Stable. 7. Chronic pain 8. Marijuana use 9. Cigarette smoker: Recommend cessation.   Empiric antibiotics for suspected community acquired pneumonia  Wean oxygen  Monitor blood pressure, saline locked  Continue chronic medications  Transfer to medical bed  Pending studies:   TSH  Hemoglobin A1c  Urine culture   Blood cultures  Code Status: Full code DVT prophylaxis: Lovenox Family Communication: None present Disposition Plan:  Home, likely 1-2 days  Brendia Sacks, MD  [facility]  Pager [phone] If 7PM-7AM, please contact night-coverage at [email or web address], password [facility] 05/30/2013, 11:40 AM  LOS: 1 day   Clinical Summary: 52 year old woman with history of HIV and multiple other medical problems presented to the emergency department with multiple complaints including fever, chills, diarrhea, sinus pressure, sore throat, lightheadedness, abdominal cramps, blurred vision, dental pain, shortness of breath. In the emergency department was noted to be hypotensive with lactate 5.2. Negative urinalysis, CBC  unremarkable. Chest x-ray suggested right lower lobe collapse or consolidation. CT of the abdomen and pelvis was unremarkable. She was treated 2 L of normal saline with dramatic improvement. ED impression was hypotension, sepsis, community acquired pneumonia with hypoxia, HIV. Admitting physician's impression: Hypertension unspecified. Dehydration. Lactic acidosis.  Consultants:    Procedures:    Antibiotics:  Zosyn 8/8 >>8/8  Vancomycin 8/8 >>  8/8  Ceftriaxone 8/9 >>   Zithromax 8/9 >>  HPI/Subjective: Overall she feels somewhat better today. She was able the breakfast. Some nausea but no vomiting. Minimal abdominal pain. No trouble breathing. No chest discomfort.  Objective: Filed Vitals:   05/30/13 0800 05/30/13 0805 05/30/13 0900 05/30/13 1000  BP: 112/76  108/82 118/83  Pulse: 81 84    Temp: 98.4 F (36.9 C)     TempSrc: Oral     Resp: 15 11 18 10   Height:      Weight:      SpO2: 100% 99%      Intake/Output Summary (Last 24 hours) at 05/30/13 1140 Last data filed at 05/30/13 1000  Gross per 24 hour  Intake 2243.75 ml  Output      0 ml  Net 2243.75 ml     Filed Weights   05/29/13 1301 05/29/13 2110  Weight: 79.379 kg (175 lb) 85.9 kg (189 lb 6 oz)    Exam:   Afebrile, vital signs stable.  General: Appears calm and comfortable. Well-appearing.  Cardiovascular: Regular rate and rhythm. No murmur, rub, gallop.  Telemetry: Sinus rhythm.  Respiratory: Clear to auscultation bilaterally. No wheezes, rales, rhonchi. Normal respiratory effort.  Abdomen: Soft, nontender, nondistended.  Musculoskeletal: Grossly normal tone. No lower extremity edema.  Psychiatric: Grossly normal mood and affect. Speech fluent and appropriate.  Data Reviewed:  As documented above in summary  Complete metabolic panel unremarkable with exception of elevated alkaline phosphatase.  This is chronic by chart review.  Lactic acid now normal. CBC unremarkable.   Scheduled  Meds: . Darunavir Ethanolate  800 mg Oral Q breakfast  . diclofenac  75 mg Oral BID  . emtricitabine-tenofovir  1 tablet Oral Daily  . enoxaparin (LOVENOX) injection  40 mg Subcutaneous Q24H  . pantoprazole  80 mg Oral BID AC  . predniSONE  20 mg Oral Q breakfast  . raltegravir  400 mg Oral Daily  . ritonavir  100 mg Oral Q breakfast   Continuous Infusions: . 0.9 % NaCl with KCl 20 mEq / L 150 mL/hr at 05/30/13 1000    Principal Problem:   CAP (community acquired pneumonia) Active Problems:   Human immunodeficiency virus (HIV) disease   Obesity, unspecified   Difficulty in walking(719.7)   Tobacco abuse   Hypotension, unspecified   Dehydration   Primary lactic acidemia   Osteoarthritis of both knees   Acute respiratory failure with hypoxia   Time spent 25  minutes

## 2013-05-31 LAB — HEMOGLOBIN A1C
Hgb A1c MFr Bld: 5 % (ref <5.7)
Mean Plasma Glucose: 97 mg/dL (ref <117)

## 2013-05-31 LAB — URINE CULTURE

## 2013-05-31 MED ORDER — AZITHROMYCIN 250 MG PO TABS: 250.0000 mg | ORAL_TABLET | Freq: Every day | ORAL | Status: DC

## 2013-05-31 MED ORDER — CEFUROXIME AXETIL 500 MG PO TABS: 500.0000 mg | ORAL_TABLET | Freq: Two times a day (BID) | ORAL | Status: DC

## 2013-05-31 MED ORDER — FLUTICASONE PROPIONATE HFA 44 MCG/ACT IN AERO
2.0000 | INHALATION_SPRAY | Freq: Two times a day (BID) | RESPIRATORY_TRACT | Status: DC
  Administered 2013-05-31: 2 via RESPIRATORY_TRACT
  Filled 2013-05-31: qty 10.6

## 2013-05-31 MED ORDER — ALBUTEROL SULFATE (5 MG/ML) 0.5% IN NEBU: 2.5000 mg | INHALATION_SOLUTION | RESPIRATORY_TRACT | Status: DC | PRN

## 2013-05-31 MED ORDER — CEFUROXIME AXETIL 250 MG PO TABS
500.0000 mg | ORAL_TABLET | Freq: Two times a day (BID) | ORAL | Status: DC
  Administered 2013-05-31: 500 mg via ORAL
  Filled 2013-05-31: qty 2

## 2013-05-31 NOTE — Progress Notes (Signed)
[facility] PROGRESS NOTE  [name] ZOX:[id number] DOB: May 24, 1961 DOA: 05/29/2013 PCP: Lonia Blood, MD Infectious disease specialist: Dr. Ninetta Lights  Assessment/Plan: 1. Suspected community acquired pneumonia: Appears to be improving. Tension oral antibiotics. History and imaging does not suggest PCP. Last CD4 count 790 in 03/2013. At that time viral load was undetectable. 2. Acute hypoxic respiratory failure: Appears resolved at this point. Presumably secondary to pneumonia. 3. Hypotension, possible sepsis on admission: Resolved with aggressive IV fluids. Possibly complicated by diarrhea and volume loss at home. 4. Nausea, vomiting, diarrhea: Appears resolved. CT of the abdomen and pelvis unremarkable. Exam benign. No further evaluation suggested. 5. HIV: Appears stable. Continue chronic regimen. 6. Bipolar disorder, anxiety, depression: Stable. 7. Chronic pain 8. Marijuana use 9. Cigarette smoker: Recommend cessation.   Change to oral antibiotics   Home today  Pending studies:   Hemoglobin A1c  Blood cultures  Code Status: Full code DVT prophylaxis: Lovenox Family Communication: None present Disposition Plan:  Home  Brendia Sacks, MD  [facility]  Pager [phone] If 7PM-7AM, please contact night-coverage at [email or web address], password [facility] 05/31/2013, 9:41 AM  LOS: 2 days   Clinical Summary: 52 year old woman with history of HIV and multiple other medical problems presented to the emergency department with multiple complaints including fever, chills, diarrhea, sinus pressure, sore throat, lightheadedness, abdominal cramps, blurred vision, dental pain, shortness of breath. In the emergency department was noted to be hypotensive with lactate 5.2. Negative urinalysis, CBC unremarkable. Chest x-ray suggested right lower lobe collapse or consolidation. CT of the abdomen and pelvis was unremarkable. She was treated 2 L of normal saline with dramatic improvement. ED  impression was hypotension, sepsis, community acquired pneumonia with hypoxia, HIV. Admitting physician's impression: Hypertension unspecified. Dehydration. Lactic acidosis.  Consultants:    Procedures:    Antibiotics:  Zosyn 8/8 >>8/8  Vancomycin 8/8 >>  8/8  Ceftriaxone 8/9 >> 8/9  Ceftin 8/10 >> 8/14  Zithromax 8/9 >> 8/13  HPI/Subjective: No issues overnight per RN. Breathing fine. Tolerating food, no vomiting. Feels better.  Objective: Filed Vitals:   05/31/13 0500 05/31/13 0600 05/31/13 0744 05/31/13 0817  BP:  134/89  136/91  Pulse:      Temp:  97.8 F (36.6 C)    TempSrc:  Oral    Resp:  18  18  Height:      Weight: 88.1 kg (194 lb 3.6 oz)     SpO2:  97% 98% 98%    Intake/Output Summary (Last 24 hours) at 05/31/13 0941 Last data filed at 05/31/13 0100  Gross per 24 hour  Intake 1617.5 ml  Output      0 ml  Net 1617.5 ml     Filed Weights   05/29/13 1301 05/29/13 2110 05/31/13 0500  Weight: 79.379 kg (175 lb) 85.9 kg (189 lb 6 oz) 88.1 kg (194 lb 3.6 oz)    Exam:   Afebrile, vital signs stable.  General: Appears calm and comfortable.  Respiratory: Clear to auscultation bilaterally. No wheezes, rales, rhonchi. Normal respiratory effort.  Cardiovascular: Regular rate and rhythm. No murmur, rub, gallop.  Abdomen: Soft, nontender, nondistended.  Psychiatric: Grossly normal mood and affect. Speech fluent and appropriate.  Data Reviewed:  1 stool charted.  Urine culture insignificant growth  TSH normal  Scheduled Meds: . azithromycin  250 mg Oral Daily  . cefTRIAXone (ROCEPHIN)  IV  1 g Intravenous Q24H  . Darunavir Ethanolate  800 mg Oral Q breakfast  . diclofenac  75 mg Oral BID  .  emtricitabine-tenofovir  1 tablet Oral Daily  . enoxaparin (LOVENOX) injection  40 mg Subcutaneous Q24H  . fluticasone  2 puff Inhalation BID  . pantoprazole  80 mg Oral BID AC  . predniSONE  20 mg Oral Q breakfast  . raltegravir  400 mg Oral Daily   . ritonavir  100 mg Oral Q breakfast   Continuous Infusions:    Principal Problem:   CAP (community acquired pneumonia) Active Problems:   Human immunodeficiency virus (HIV) disease   Obesity, unspecified   Difficulty in walking(719.7)   Tobacco abuse   Hypotension, unspecified   Dehydration   Primary lactic acidemia   Osteoarthritis of both knees   Acute respiratory failure with hypoxia

## 2013-05-31 NOTE — Progress Notes (Signed)
Pt discharged home per MD order. All discharge instructions and medications gone over with pt using teach back method. All questions and concerns answered. Pt discharged home via wheelchair with personal belongings.

## 2013-05-31 NOTE — Discharge Summary (Signed)
Physician Discharge Summary  [name]:[id number] DOB: 18-Oct-1961 DOA: 05/29/2013  PCP: Veronica Blood, MD  Admit date: 05/29/2013 Discharge date: 05/31/2013  Recommendations for Outpatient Follow-up:  1. Followup for resolution of suspected community acquired pneumonia 2. Could consider alternative agent to verapamil, see discussion below   Pending studies:  Hemoglobin A1c  [name] cultures  Follow-up Information   Follow up with [facility], MD In 2 weeks.   Contact information:   [address] [place] [address] [phone]      Discharge Diagnoses:  1. Suspected community acquired pneumonia 2. Acute hypoxic respiratory failure 3. Hypotension, possible sepsis on admission 4. Nausea, vomiting, diarrhea 5. HIV  Discharge Condition: Improved Disposition: Home  Diet recommendation: Regular  Filed Weights   05/29/13 1301 05/29/13 2110 05/31/13 0500  Weight: 79.379 kg (175 lb) 85.9 kg (189 lb 6 oz) 88.1 kg (194 lb 3.6 oz)    History of present illness:  52 year old woman with history of HIV and multiple other medical problems presented to the emergency department with multiple complaints including fever, chills, diarrhea, sinus pressure, sore throat, lightheadedness, abdominal cramps, blurred vision, dental pain, shortness of breath. In the emergency department was noted to be hypotensive with lactate 5.2. Negative urinalysis, CBC unremarkable. Chest x-ray suggested right lower lobe collapse or consolidation. CT of the abdomen and pelvis was unremarkable. She was treated 2 L of normal saline with dramatic improvement. ED impression was hypotension, sepsis, community acquired pneumonia with hypoxia, HIV. Admitting physician's impression: Hypertension unspecified. Dehydration. Lactic acidosis.  Hospital Course:  [name] was admitted for further evaluation and treatment of hypotension, possible sepsis and possible pneumonia. After aggressive hydration she  rapidly improved with resolution of lactic acidosis and no recurrence of hypotension. Vomiting and diarrhea resolved and she has been tolerating a diet. Etiology of hypotension and acute illness this likely secondary to volume depletion although cannot exclude pneumonia and early sepsis. Furthermore HIV medications can potentiate verapamil, however in chart review she has been on this medication for some time so that is felt to be less likely. She will complete a course of empiric antibiotics for suspected pneumonia and is stable for discharge today. Individual issues as below.  1. Suspected community acquired pneumonia: Appears to be improving. Change to oral antibiotics. History and imaging does not suggest PCP. Last CD4 count 790 in 03/2013. At that time viral load was undetectable. 2. Acute hypoxic respiratory failure: Appears resolved at this point. Presumably secondary to pneumonia. 3. Hypotension, possible sepsis on admission: Resolved with aggressive IV fluids. Possibly complicated by diarrhea and volume loss at home. 4. Nausea, vomiting, diarrhea: Appears resolved. CT of the abdomen and pelvis unremarkable. Exam benign. No further evaluation suggested. 5. HIV: Appears stable. Continue chronic regimen. 6. Bipolar disorder, anxiety, depression: Stable. 7. Chronic pain 8. Marijuana use 9. Cigarette smoker: Recommend cessation.  Consultants: None Procedures: None  Antibiotics:  Zosyn 8/8 >>8/8  Vancomycin 8/8 >> 8/8  Ceftriaxone 8/9 >> 8/9  Ceftin 8/10 >> 8/14  Zithromax 8/9 >> 8/13  Discharge Instructions  Discharge Orders   Future Appointments Provider Department Dept Phone   10/07/2013 9:45 AM Veronica Smart, MD [facility] [phone]   Future Orders Complete By Expires     Activity as tolerated - No restrictions  As directed     Diet general  As directed     Discharge instructions  As directed     Comments:  Be sure to finish  antibiotics for suspected pneumonia. Call your physician or seek immediate medical attention for lightheadedness, shortness of breath, recurrent vomiting or diarrhea, or worsening of condition.        Medication List         albuterol 108 (90 BASE) MCG/ACT inhaler  Commonly known as:  PROVENTIL HFA;VENTOLIN HFA  Inhale 2 puffs into the lungs every 4 (four) hours as needed.     ALPRAZolam 1 MG tablet  Commonly known as:  XANAX  Take 1 mg by mouth 2 (two) times daily as needed for anxiety.     Azelastine HCl 0.15 % Soln  Place 1 spray into the nose daily.     azithromycin 250 MG tablet  Commonly known as:  ZITHROMAX  Take 1 tablet (250 mg total) by mouth daily.     beclomethasone 40 MCG/ACT inhaler  Commonly known as:  QVAR  Inhale 2 puffs into the lungs 2 (two) times daily. 2 puffs two times a day     cefUROXime 500 MG tablet  Commonly known as:  CEFTIN  Take 1 tablet (500 mg total) by mouth 2 (two) times daily with a meal.     cetirizine 10 MG tablet  Commonly known as:  ZYRTEC  Take 10 mg by mouth daily.     Darunavir Ethanolate 800 MG tablet  Commonly known as:  PREZISTA  Take 1 tablet (800 mg total) by mouth daily with breakfast.     diclofenac 75 MG EC tablet  Commonly known as:  VOLTAREN  Take 75 mg by mouth 2 (two) times daily.     diphenoxylate-atropine 2.5-0.025 MG per tablet  Commonly known as:  LOMOTIL  Take 1 tablet by mouth 4 (four) times daily as needed for diarrhea or loose stools.     emtricitabine-tenofovir 200-300 MG per tablet  Commonly known as:  TRUVADA  Take 1 tablet by mouth daily.     esomeprazole 40 MG capsule  Commonly known as:  NEXIUM  Take 1 capsule (40 mg total) by mouth daily before breakfast. Take one capsule by mouth once a day     HYDROcodone-acetaminophen 7.5-325 MG per tablet  Commonly known as:  NORCO  Take 1 tablet by mouth Every 4 hours as needed for pain.     imiquimod 5 % cream  Commonly known as:  ALDARA  Apply to  affected area at bedtime, and wash off after 6 to 10 hours     loperamide 2 MG capsule  Commonly known as:  IMODIUM  Take 2 mg by mouth 4 (four) times daily as needed for diarrhea or loose stools.     methocarbamol 750 MG tablet  Commonly known as:  ROBAXIN  Take 1 tablet (750 mg total) by mouth 4 (four) times daily as needed. For muscle spasms     ondansetron 4 MG tablet  Commonly known as:  ZOFRAN  Take 1 tablet (4 mg total) by mouth every 8 (eight) hours as needed for nausea.     oxyCODONE-acetaminophen 7.5-325 MG per tablet  Commonly known as:  PERCOCET  Take 1 tablet by mouth every 8 (eight) hours as needed for pain.     potassium chloride 10 MEQ tablet  Commonly known as:  K-DUR  Take 10 mEq by mouth daily.     predniSONE 20 MG tablet  Commonly known as:  DELTASONE  Take 20 mg by mouth daily with breakfast.     promethazine 25 MG tablet  Commonly known as:  PHENERGAN  Take 25 mg by mouth every 6 (six) hours as needed for nausea.     raltegravir 400 MG tablet  Commonly known as:  ISENTRESS  Take 1 tablet (400 mg total) by mouth daily.     ritonavir 100 MG capsule  Commonly known as:  NORVIR  Take 1 capsule (100 mg total) by mouth daily.     verapamil 240 MG CR tablet  Commonly known as:  CALAN-SR  Take 1 tablet (240 mg total) by mouth daily.     VITAMIN B-12 IJ  Inject as directed every 30 (thirty) days.     Vitamin D (Ergocalciferol) 50000 UNITS Caps capsule  Commonly known as:  DRISDOL  Take 50,000 Units by mouth every 7 (seven) days. Take on mondays     zolpidem 5 MG tablet  Commonly known as:  AMBIEN  Take 5 mg by mouth at bedtime as needed for sleep.       Allergies  Allergen Reactions  . Aspirin Nausea And Vomiting  . Sulfonamide Derivatives Hives    The results of significant diagnostics from this hospitalization (including imaging, microbiology, ancillary and laboratory) are listed below for reference.    Significant Diagnostic Studies: Ct  Abdomen Pelvis W Contrast  05/29/2013   *RADIOLOGY REPORT*  Clinical Data: Suprapubic pain.  CT ABDOMEN AND PELVIS WITH CONTRAST  Technique:  Multidetector CT imaging of the abdomen and pelvis was performed following the standard protocol during bolus administration of intravenous contrast.  Contrast: 50mL OMNIPAQUE IOHEXOL 300 MG/ML  SOLN, OMNIPAQUE IOHEXOL 300 MG/ML  SOLN  Comparison: None.  Findings: Moderate sized hiatal hernia.  Subsegmental atelectasis or scar in the lingula.  Post cholecystectomy  Diffuse hepatic steatosis.  Spleen, adrenal glands are within normal limits.  Chronic changes of the kidneys are stable.  Prominent pancreatic calcifications and pancreatic atrophy are present compatible with stigmata of chronic pancreatitis.  No obvious acute inflammatory changes.  Advanced bilateral femoral head AVN.  Bladder is distended.  No obvious bowel wall thickening to suggest an inflammatory process.  No free fluid.  No abnormal retroperitoneal adenopathy.  IMPRESSION: No acute intra-abdominal process.  Chronic change.   Original Report Authenticated By: Jolaine Click, M.D.   Dg Chest Port 1 View  05/29/2013   *RADIOLOGY REPORT*  Clinical Data: 52 year old female with shortness of breath, hypertension.  PORTABLE CHEST - 1 VIEW  Comparison: 05/08/2013 and earlier.  Findings: Portable AP upright view at 1416 hours.  Increased pulmonary vascularity.  Increased retrocardiac opacity.  No pneumothorax or definite effusion.  Stable cardiac size and mediastinal contours.  Visualized tracheal air column is within normal limits.  Stable visible proximal right humerus hardware.  IMPRESSION: Increased pulmonary vascular congestion and new left greater than right lower lobe collapse or consolidation.   Original Report Authenticated By: Erskine Speed, M.D.    Microbiology: Recent Results (from the past 240 hour(s))  CULTURE, [name] (ROUTINE X 2)     Status: None   Collection Time    05/29/13  2:53 PM       Result Value Range Status   Specimen Description [name] LEFT ANTECUBITAL   Final   Special Requests BOTTLES DRAWN AEROBIC ONLY 5CC   Final   Culture NO GROWTH 2 DAYS   Final   Report Status PENDING   Incomplete  CULTURE, [name] (ROUTINE X 2)     Status: None   Collection Time    05/29/13  4:06 PM  Result Value Range Status   Specimen Description [name] LEFT HAND   Final   Special Requests BOTTLES DRAWN AEROBIC AND ANAEROBIC 6CC   Final   Culture NO GROWTH 2 DAYS   Final   Report Status PENDING   Incomplete  URINE CULTURE     Status: None   Collection Time    05/29/13  4:34 PM      Result Value Range Status   Specimen Description URINE, CLEAN CATCH   Final   Special Requests NONE   Final   Culture  Setup Time     Final   Value: 05/30/2013 00:18     Performed at [facility] Count     Final   Value: 5,000 COLONIES/ML     Performed at [facility]   Culture     Final   Value: INSIGNIFICANT GROWTH     Performed at [facility]   Report Status 05/31/2013 FINAL   Final  MRSA PCR SCREENING     Status: None   Collection Time    05/29/13  9:10 PM      Result Value Range Status   MRSA by PCR NEGATIVE  NEGATIVE Final   Comment:            The GeneXpert MRSA Assay (FDA     approved for NASAL specimens     only), is one component of a     comprehensive MRSA colonization     surveillance program. It is not     intended to diagnose MRSA     infection nor to guide or     monitor treatment for     MRSA infections.     Labs: Basic Metabolic Panel:  Recent Labs Lab 05/29/13 1442 05/30/13 0534  NA 137 138  K 4.3 4.2  CL 100 104  CO2 20 23  GLUCOSE 101* 76  BUN 6 <3*  CREATININE 0.68 0.46*  CALCIUM 8.1* 7.6*  MG  --  1.2*   Liver Function Tests:  Recent Labs Lab 05/29/13 1451 05/30/13 0534  AST 23 22  ALT 10 9  ALKPHOS 152* 135*  BILITOT 0.2* 0.4  PROT 6.6 5.9*  ALBUMIN 2.9* 2.6*    Recent Labs Lab 05/29/13 1451  LIPASE 13    CBC:  Recent Labs Lab 05/29/13 1442 05/30/13 0534  WBC 5.7 5.3  NEUTROABS 2.5  --   HGB 11.4* 10.5*  HCT 34.8* 31.6*  MCV 91.6 90.8  PLT 200 156   Cardiac Enzymes:  Recent Labs Lab 05/29/13 1451  TROPONINI <0.30     Recent Labs  05/29/13 1451  PROBNP 52.9   Principal Problem:   CAP (community acquired pneumonia) Active Problems:   Human immunodeficiency virus (HIV) disease   Obesity, unspecified   Difficulty in walking(719.7)   Tobacco abuse   Hypotension, unspecified   Dehydration   Primary lactic acidemia   Osteoarthritis of both knees   Acute respiratory failure with hypoxia   Time coordinating discharge: 25 minutes  Signed:  Brendia Sacks, MD [facility] 05/31/2013, 9:56 AM

## 2013-06-01 NOTE — Progress Notes (Signed)
UR Chart Review Completed  

## 2013-06-03 LAB — CULTURE, BLOOD (ROUTINE X 2)

## 2013-06-25 ENCOUNTER — Other Ambulatory Visit: Payer: Self-pay | Admitting: Orthopedic Surgery

## 2013-06-25 ENCOUNTER — Inpatient Hospital Stay (HOSPITAL_COMMUNITY)
Admission: RE | Admit: 2013-06-25 | Discharge: 2013-06-25 | Disposition: A | Discharge status: Home / Self Care | Payer: Medicare Other | Source: Ambulatory Visit

## 2013-06-25 NOTE — Progress Notes (Signed)
Spoke to [place] and requested that orders be placed in computer

## 2013-06-25 NOTE — Progress Notes (Signed)
Called patient and she stated that she forgot about appointment.

## 2013-06-26 ENCOUNTER — Encounter (HOSPITAL_COMMUNITY): Payer: Self-pay | Admitting: Vascular Surgery

## 2013-06-26 ENCOUNTER — Encounter (HOSPITAL_COMMUNITY): Payer: Self-pay | Admitting: Pharmacy Technician

## 2013-06-26 ENCOUNTER — Encounter (HOSPITAL_COMMUNITY): Payer: Self-pay

## 2013-06-26 ENCOUNTER — Encounter (HOSPITAL_COMMUNITY)
Admission: RE | Admit: 2013-06-26 | Discharge: 2013-06-26 | Disposition: A | Discharge status: Home / Self Care | Payer: Medicare Other | Source: Ambulatory Visit | Attending: Orthopedic Surgery | Admitting: Orthopedic Surgery

## 2013-06-26 ENCOUNTER — Encounter (HOSPITAL_COMMUNITY)
Admission: RE | Admit: 2013-06-26 | Discharge: 2013-06-26 | Disposition: A | Discharge status: Home / Self Care | Payer: Medicare Other | Source: Ambulatory Visit | Attending: Anesthesiology | Admitting: Anesthesiology

## 2013-06-26 DIAGNOSIS — Z01812 Encounter for preprocedural laboratory examination: Secondary | ICD-10-CM | POA: Insufficient documentation

## 2013-06-26 DIAGNOSIS — Z01818 Encounter for other preprocedural examination: Secondary | ICD-10-CM | POA: Insufficient documentation

## 2013-06-26 LAB — BASIC METABOLIC PANEL
Calcium: 8.8 mg/dL (ref 8.4–10.5)
Chloride: 95 mEq/L — ABNORMAL LOW (ref 96–112)
Creatinine, Ser: 0.53 mg/dL (ref 0.50–1.10)
GFR calc Af Amer: >90 mL/min (ref >90)

## 2013-06-26 LAB — CBC
HCT: 38.6 % (ref 36.0–46.0)
Platelets: 188 10*3/uL (ref 150–400)
RDW: 14.6 % (ref 11.5–15.5)
WBC: 8.3 10*3/uL (ref 4.0–10.5)

## 2013-06-26 NOTE — Progress Notes (Signed)
Pt C/O SOB, chest tightness, toothache, pot-nasal drip and "not feeling well." I asked pt for permission to escort her to the emergency room. Pt refused treatment stating, " I don't want to go to the Emergency Room." [place], [place] ( anesthesia) notified regarding pt chest x ray and EKG. Pt assessed by Revonda Standard, DR. Dion Saucier was notified regarding pt condition and surgery will be rescheduled. Revonda Standard, PA advised to repeat chest x ray and obtain labs ( CBC, BMP only) for further evaluation. Type and screen, anticoags and PCR specimens were not obtained as ordered; will get once pt rescheduled for surgery.

## 2013-06-26 NOTE — Pre-Procedure Instructions (Signed)
Juleah L Bayless  06/26/2013   Your procedure is scheduled on: Tuesday, June 30, 2013  Report to [facility] Short Stay Center at 8:00 AM.  Call this number if you have problems the morning of surgery: [phone]   Remember:   Do not eat food or drink liquids after midnight.   Take these medicines the morning of surgery with A SIP OF WATER: azithromycin (ZITHROMAX) 250 MG tablet, cefUROXime (CEFTIN) 500 MG tablet, Darunavir Ethanolate (PREZISTA) 800 MG tablet ,raltegravir (ISENTRESS) 400 MG tablet,  emtricitabine-tenofovir (TRUVADA) 200-300 MG per tablet, ritonavir (NORVIR) 100 MG capsule  esomeprazole (NEXIUM) 40 MG capsule, predniSONE (DELTASONE) 20 MG tablet, cetirizine (ZYRTEC) 10 MG tablet, beclomethasone (QVAR) 40 MCG/ACT inhaler, Azelastine HCl 0.15 % SOLN, if needed: HYDROcodone-acetaminophen (NORCO) 7.5-325 MG per tablet for pain, ALPRAZolam (XANAX) 1 MG tablet for anxiety, ondansetron (ZOFRAN) 4 MG tablet for nausea, albuterol (PROVENTIL HFA;VENTOLIN HFA) 108 (90 BASE) MCG/ACT as needed ( bring in on day of surgery) Stop taking Aspirin and herbal medications. Do not take any NSAIDs ie: Ibuprofen, Advil, Naproxen or any medication containing Aspirin ( diclofenac (VOLTAREN) 75 MG EC tablet)  Do not wear jewelry, make-up or nail polish.  Do not wear lotions, powders, or perfumes. You may wear deodorant.  Do not shave 48 hours prior to surgery.  Do not bring valuables to the hospital.  [facility] is not responsible for any belongings or valuables.  Contacts, dentures or bridgework may not be worn into surgery.  Leave suitcase in the car. After surgery it may be brought to your room.  For patients admitted to the hospital, checkout time is 11:00 AM the day of discharge.   Patients discharged the day of surgery will not be allowed to drive home.  Name and phone number of your driver:   Special Instructions: Shower using CHG 2 nights before surgery and the night before surgery.  If you  shower the day of surgery use CHG.  Use special wash - you have one bottle of CHG for all showers.  You should use approximately 1/3 of the bottle for each shower.   Please read over the following fact sheets that you were given: Pain Booklet, Coughing and Deep Breathing, Blood Transfusion Information, Total Joint Packet, MRSA Information and Surgical Site Infection Prevention

## 2013-06-26 NOTE — Progress Notes (Signed)
Anesthesia PAT Evaluation:  Patient is a 52 year old female scheduled for left TKA on 06/30/13 by Dr. Dion Saucier.  History includes HIV, recent admission for CAP with hypoxia 05/29/13, smoking, pancreatitis, HPV, Bipolar disorder, emphysema, asthma, gastric ulcer, GERD, anxiety, depression, chronic back pain, transfusion age 26, HTN, obesity, hysterectomy, right breast lumpectomy, right shoulder hemi-arthroplasty 04/2012.  PCP is Dr. Earlie Lou.  She is not followed by cardiology, but was evaluated preoperatively by cardiologist Dr. Eden Emms in 03/2012 for EKG showing possible anteroseptal infarct.  He cleared her following an echo that showed normal LV systolic function with EF 55-60%.  He felt septal changes were related to lead placement.  Patient reports that she continues to feel poorly.  She is having to use her albuterol ~ 2X/day which is increased for her.  She doesn't really feel completely recovered from her PNA, and in fact called her PCP office a couple of days a ago for further input but hasn't heard back yet.  She complains of nausea.  She also reports that she has a left tooth that hurts.  She was suppose to see a dentist on 05/29/13 but was instead admitted to the hospital as outlined above.  The tooth continues to hurt. The left side of her face also aches.  (It does not appear swollen).  She has some drainage but is unsure if it is coming from her tooth or sinuses.  She doesn't appear in acute distress, but also appears to not feel well. Vitals are currently stable.  She is afebrile.  She had a course wheeze at her right base that cleared with deep breaths, otherwise lungs were clear.  She did sound to have a slight upper airway wheeze however without labored breathing/tachynpea.  Heart RRR, rate ~ 100 bpm.  No murmur noted. I spoke with Apolinar Junes, Dr. Shelba Flake PA regarding above.  Plan to go ahead and get a repeat CXR today and basic labs. He will contact Dr. Maxwell Caul office today regarding arranging  follow-up and will then update Ms. Ruggieri. In the meantime, I have instructed her to go the Urgent Care of the ED if her symptoms worsen.  Surgery will be postponed until patient's acute issues are further addressed.  Patient is aware.  EKG on 05/30/13 showed NSR, possible LAE, anteroseptal infarct (age undetermined).  She has had numerous EKGs since 2013 that intermittently show these changes so overall I think her EKGs have been stable.  Echo on 04/11/12 showed: - Left ventricle: The cavity size was normal. There was mild concentric hypertrophy. Systolic function was normal. The estimated ejection fraction was in the range of 55% to 60%. Wall motion was normal; there were no regional wall motion abnormalities. - Atrial septum: No defect or patent foramen ovale was identified.  CXR on 06/26/13 showed: Stable lingular scarring and mild bibasilar volume loss. No new worrisome focal or acute abnormality suggested.   Labs today noted.  WBC 8.3.  Velna Ochs [facility] Short Stay Center/Anesthesiology Phone [phone] 06/26/2013 11:50 AM

## 2013-06-30 ENCOUNTER — Inpatient Hospital Stay (HOSPITAL_COMMUNITY): Admission: RE | Admit: 2013-06-30 | Payer: Medicare Other | Source: Ambulatory Visit | Admitting: Orthopedic Surgery

## 2013-06-30 ENCOUNTER — Encounter (HOSPITAL_COMMUNITY): Admission: RE | Payer: Self-pay | Source: Ambulatory Visit

## 2013-06-30 SURGERY — ARTHROPLASTY, KNEE, TOTAL
Anesthesia: General | Laterality: Left

## 2013-07-16 ENCOUNTER — Emergency Department (HOSPITAL_COMMUNITY): Payer: Medicare Other

## 2013-07-16 ENCOUNTER — Emergency Department (HOSPITAL_COMMUNITY)
Admission: EM | Admit: 2013-07-16 | Discharge: 2013-07-16 | Disposition: A | Discharge status: Home / Self Care | Payer: Medicare Other | Attending: Emergency Medicine | Admitting: Emergency Medicine

## 2013-07-16 ENCOUNTER — Encounter (HOSPITAL_COMMUNITY): Payer: Self-pay

## 2013-07-16 ENCOUNTER — Other Ambulatory Visit: Payer: Self-pay

## 2013-07-16 DIAGNOSIS — F17200 Nicotine dependence, unspecified, uncomplicated: Secondary | ICD-10-CM | POA: Insufficient documentation

## 2013-07-16 DIAGNOSIS — I10 Essential (primary) hypertension: Secondary | ICD-10-CM | POA: Insufficient documentation

## 2013-07-16 DIAGNOSIS — F411 Generalized anxiety disorder: Secondary | ICD-10-CM | POA: Insufficient documentation

## 2013-07-16 DIAGNOSIS — Z87448 Personal history of other diseases of urinary system: Secondary | ICD-10-CM | POA: Insufficient documentation

## 2013-07-16 DIAGNOSIS — J40 Bronchitis, not specified as acute or chronic: Secondary | ICD-10-CM

## 2013-07-16 DIAGNOSIS — Z21 Asymptomatic human immunodeficiency virus [HIV] infection status: Secondary | ICD-10-CM | POA: Insufficient documentation

## 2013-07-16 DIAGNOSIS — Z8701 Personal history of pneumonia (recurrent): Secondary | ICD-10-CM | POA: Insufficient documentation

## 2013-07-16 DIAGNOSIS — M129 Arthropathy, unspecified: Secondary | ICD-10-CM | POA: Insufficient documentation

## 2013-07-16 DIAGNOSIS — J45909 Unspecified asthma, uncomplicated: Secondary | ICD-10-CM

## 2013-07-16 DIAGNOSIS — K219 Gastro-esophageal reflux disease without esophagitis: Secondary | ICD-10-CM | POA: Insufficient documentation

## 2013-07-16 DIAGNOSIS — F319 Bipolar disorder, unspecified: Secondary | ICD-10-CM | POA: Insufficient documentation

## 2013-07-16 DIAGNOSIS — J45901 Unspecified asthma with (acute) exacerbation: Secondary | ICD-10-CM | POA: Insufficient documentation

## 2013-07-16 DIAGNOSIS — J438 Other emphysema: Secondary | ICD-10-CM | POA: Insufficient documentation

## 2013-07-16 DIAGNOSIS — IMO0002 Reserved for concepts with insufficient information to code with codable children: Secondary | ICD-10-CM | POA: Insufficient documentation

## 2013-07-16 DIAGNOSIS — Z8619 Personal history of other infectious and parasitic diseases: Secondary | ICD-10-CM | POA: Insufficient documentation

## 2013-07-16 DIAGNOSIS — G8929 Other chronic pain: Secondary | ICD-10-CM | POA: Insufficient documentation

## 2013-07-16 DIAGNOSIS — Z79899 Other long term (current) drug therapy: Secondary | ICD-10-CM | POA: Insufficient documentation

## 2013-07-16 LAB — BASIC METABOLIC PANEL
BUN: 10 mg/dL (ref 6–23)
Chloride: 97 mEq/L (ref 96–112)
Glucose, Bld: 171 mg/dL — ABNORMAL HIGH (ref 70–99)
Potassium: 4 mEq/L (ref 3.5–5.1)

## 2013-07-16 LAB — CBC WITH DIFFERENTIAL/PLATELET
Hemoglobin: 12.9 g/dL (ref 12.0–15.0)
Lymphocytes Relative: 52 % — ABNORMAL HIGH (ref 12–46)
Lymphs Abs: 3.9 10*3/uL (ref 0.7–4.0)
Monocytes Relative: 7 % (ref 3–12)
Neutro Abs: 3.1 10*3/uL (ref 1.7–7.7)
Neutrophils Relative %: 41 % — ABNORMAL LOW (ref 43–77)
RBC: 4.37 MIL/uL (ref 3.87–5.11)

## 2013-07-16 MED ORDER — AZITHROMYCIN 250 MG PO TABS: ORAL_TABLET | ORAL | Status: DC

## 2013-07-16 MED ORDER — IPRATROPIUM BROMIDE 0.02 % IN SOLN
0.5000 mg | Freq: Once | RESPIRATORY_TRACT | Status: AC
  Administered 2013-07-16: 0.5 mg via RESPIRATORY_TRACT
  Filled 2013-07-16: qty 2.5

## 2013-07-16 MED ORDER — METHOCARBAMOL 500 MG PO TABS
750.0000 mg | ORAL_TABLET | Freq: Once | ORAL | Status: AC
  Administered 2013-07-16: 750 mg via ORAL
  Filled 2013-07-16: qty 2

## 2013-07-16 MED ORDER — ALBUTEROL SULFATE (5 MG/ML) 0.5% IN NEBU
2.5000 mg | INHALATION_SOLUTION | Freq: Once | RESPIRATORY_TRACT | Status: AC
  Administered 2013-07-16: 2.5 mg via RESPIRATORY_TRACT
  Filled 2013-07-16: qty 0.5

## 2013-07-16 MED ORDER — HYDROCODONE-HOMATROPINE 5-1.5 MG/5ML PO SYRP: 5.0000 mL | ORAL_SOLUTION | Freq: Four times a day (QID) | ORAL | Status: DC | PRN

## 2013-07-16 NOTE — ED Notes (Signed)
Pt notified RN of left leg and hip pain with muscle spasms.  Pt reports has history of sciatic pain. EDP notified of pt's new complaint.

## 2013-07-16 NOTE — ED Notes (Signed)
MD at bedside.  Dr Adriana Simas at bedside examining said pt and discussing plan of care.

## 2013-07-16 NOTE — ED Provider Notes (Signed)
CSN: [id number]     Arrival date & time 07/16/13  1023 History  This chart was scribed for Donnetta Hutching, MD by Quintella Reichert, ED scribe.  This patient was seen in room APA14/APA14 and the patient's care was started at 11:49 AM.  Chief Complaint  Patient presents with  . Cough  . Shortness of Breath    The history is provided by the patient. No language interpreter was used.    HPI Comments: [name] is a 52 y.o. female with h/o HIV, recent admission for community-acquired pneumonia with hypoxia (05/29/13), emphysema, asthma, and hypertension who presents to the Emergency Department complaining of constant moderate SOB that began this morning with associated cough, chest congestion, and sore throat.  Pt states she did not take her Qvar treatment this morning.  She normally uses breathing treatment at home every 4 hours.  She is using steroids.  PCP is Dr. Mikeal Hawthorne   Past Medical History  Diagnosis Date  . Baker's cyst   . Pulmonary lesion 1/03    on RUL onn CT   . Hemorrhoids   . Zoster 2001  . Family history of diabetes mellitus   . Tobacco user   . Other abnormal Papanicolaou smear of cervix and cervical HPV(795.09)   . Herpes zoster   . Hemorrhoids   . Baker's cyst   . Pancreatitis   . HPV (human papilloma virus) infection   . HIV disease     take Prezista,Truvada,Norvir,and Isentress daily  . Bipolar disorder     With hx of psychotic features.  . Articular cartilage disorder of knee   . Bursitis of shoulder   . Rotator cuff arthropathy   . Hypertension     takes Clonidine and Verapamil daily  . Emphysema   . Asthma     Albuterol resue inhaler and QVAR daily  . Shortness of breath     with exertion and stress  . Pneumonia     hx of;last time 1 1/69yrs ago  . Headache(784.0)   . Dizziness   . Arthritis   . Joint pain   . Joint swelling   . Chronic back pain     buldging disc  . Bruises easily   . Gastric ulcer   . GERD (gastroesophageal reflux disease)      takes Nexium daily  . Chronic constipation   . Urinary frequency   . Urinary incontinence   . Nocturia   . History of blood transfusion at age 30  . Early cataracts, bilateral   . Anxiety   . Depression     takes Xanax daily and Depakote bid  . History of shingles   . 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 Tobacco abuse 10/26/2012  . HIV (human immunodeficiency virus infection)   . Chronic back pain   . Chronic knee pain     Past Surgical History  Procedure Laterality Date  . Right breast lumpectomy    . Total abdominal hysterectomy w/ bilateral salpingoophorectomy  at age 44  . Breast surgery  as a teenager    rt breast biopsy  . Cholecystectomy  10+yrs ago  . Knee arthroscopy      bilateral  . Appendectomy      with hysterectomy  . Finger surgery      rt middle finger with pin in it  . Epidural injections    . Esophagogastroduodenoscopy    . Shoulder hemi-arthroplasty  05/06/2012    Procedure: SHOULDER  HEMI-ARTHROPLASTY;  Surgeon: Eulas Post, MD;  Location: [facility] OR;  Service: Orthopedics;  Laterality: Right;  . Abdominal hysterectomy    . Joint replacement    . Fracture surgery      Right finger third digit    Family History  Problem Relation Age of Onset  . Diabetes      Family History  . Hypertension      Family history   . Hypertension Mother   . Diabetes Mother   . Diabetes Father   . Cancer Sister 47    x2 (liver)  . Diabetes Brother     History  Substance Use Topics  . Smoking status: Current Every Day Smoker -- 1.00 packs/day for 20 years    Types: Cigarettes  . Smokeless tobacco: Never Used  . Alcohol Use: 0.5 oz/week    1 drink(s) per week     Comment: brandy occ    OB History   Grav Para Term Preterm Abortions TAB SAB Ect Mult Living                  Review of Systems A complete 10 system review of systems was obtained and all systems are negative except as noted in the HPI and PMH.     Allergies  Aspirin and Sulfonamide derivatives  Home Medications   Current Outpatient Rx  Name  Route  Sig  Dispense  Refill  . albuterol (PROVENTIL HFA;VENTOLIN HFA) 108 (90 BASE) MCG/ACT inhaler   Inhalation   Inhale 2 puffs into the lungs every 4 (four) hours as needed.          . ALPRAZolam (XANAX) 1 MG tablet   Oral   Take 1 mg by mouth 2 (two) times daily as needed for anxiety.         . Azelastine HCl 0.15 % SOLN   Nasal   Place 1 spray into the nose daily.         . beclomethasone (QVAR) 40 MCG/ACT inhaler   Inhalation   Inhale 2 puffs into the lungs 2 (two) times daily. 2 puffs two times a day   1 Inhaler   3   . cetirizine (ZYRTEC) 10 MG tablet   Oral   Take 10 mg by mouth daily.         . Cyanocobalamin (VITAMIN B-12 IJ)   Injection   Inject as directed every 30 (thirty) days.         . Darunavir Ethanolate (PREZISTA) 800 MG tablet   Oral   Take 1 tablet (800 mg total) by mouth daily with breakfast.   30 tablet   6   . diclofenac (VOLTAREN) 75 MG EC tablet   Oral   Take 75 mg by mouth 2 (two) times daily.         . diphenoxylate-atropine (LOMOTIL) 2.5-0.025 MG per tablet   Oral   Take 1 tablet by mouth 4 (four) times daily as needed for diarrhea or loose stools.         Marland Kitchen emtricitabine-tenofovir (TRUVADA) 200-300 MG per tablet   Oral   Take 1 tablet by mouth daily.   90 tablet   4   . esomeprazole (NEXIUM) 40 MG capsule   Oral   Take 40 mg by mouth daily before breakfast.         . HYDROcodone-acetaminophen (NORCO) 7.5-325 MG per tablet   Oral   Take 1 tablet by mouth Every 4 hours as needed for pain.          Marland Kitchen  imiquimod (ALDARA) 5 % cream      Apply to affected area at bedtime, and wash off after 6 to 10 hours   24 each   0   . loperamide (IMODIUM) 2 MG capsule   Oral   Take 2 mg by mouth 4 (four) times daily as needed for diarrhea or loose stools.         . methocarbamol (ROBAXIN) 750 MG tablet   Oral    Take 1 tablet (750 mg total) by mouth 4 (four) times daily as needed. For muscle spasms   75 tablet   1   . ondansetron (ZOFRAN) 4 MG tablet   Oral   Take 1 tablet (4 mg total) by mouth every 8 (eight) hours as needed for nausea.   6 tablet   0   . oxyCODONE-acetaminophen (PERCOCET) 7.5-325 MG per tablet   Oral   Take 1 tablet by mouth every 8 (eight) hours as needed for pain.          . potassium chloride (K-DUR) 10 MEQ tablet   Oral   Take 10 mEq by mouth daily.         . predniSONE (DELTASONE) 20 MG tablet   Oral   Take 20 mg by mouth daily with breakfast.         . promethazine (PHENERGAN) 25 MG tablet   Oral   Take 25 mg by mouth every 6 (six) hours as needed for nausea.         . raltegravir (ISENTRESS) 400 MG tablet   Oral   Take 1 tablet (400 mg total) by mouth daily.   90 tablet   4   . ritonavir (NORVIR) 100 MG capsule   Oral   Take 1 capsule (100 mg total) by mouth daily.   90 capsule   4   . verapamil (CALAN-SR) 240 MG CR tablet   Oral   Take 1 tablet (240 mg total) by mouth daily.   30 tablet   1   . Vitamin D, Ergocalciferol, (DRISDOL) 50000 UNITS CAPS   Oral   Take 50,000 Units by mouth every 7 (seven) days. Take on mondays         . zolpidem (AMBIEN) 5 MG tablet   Oral   Take 5 mg by mouth at bedtime as needed for sleep.           BP 124/98  Pulse 125  Temp(Src) 98.8 F (37.1 C) (Oral)  Resp 22  Ht 5\' 4"  (1.626 m)  Wt 179 lb (81.194 kg)  BMI 30.71 kg/m2  SpO2 95%  Physical Exam  Nursing note and vitals reviewed. Constitutional: She is oriented to person, place, and time. She appears well-developed and well-nourished.  HENT:  Head: Normocephalic and atraumatic.  Eyes: Conjunctivae and EOM are normal. Pupils are equal, round, and reactive to light.  Neck: Normal range of motion. Neck supple.  Cardiovascular: Normal rate, regular rhythm and normal heart sounds.   No murmur heard. Pulmonary/Chest: Effort normal. No  respiratory distress. She has wheezes. She has no rales.  Minimal expiratory wheezes  Abdominal: Soft. Bowel sounds are normal.  Musculoskeletal: Normal range of motion.  Neurological: She is alert and oriented to person, place, and time.  Skin: Skin is warm and dry.  Psychiatric: She has a normal mood and affect.    ED Course  Procedures (including critical care time)  DIAGNOSTIC STUDIES: Oxygen Saturation is 95% on room air, adequate by my interpretation.  COORDINATION OF CARE: 11:52 AM-Discussed treatment plan which includes breathing treatment and CXR with pt at bedside and pt agreed to plan.    Results for orders placed during the hospital encounter of 07/16/13  CBC WITH DIFFERENTIAL      Result Value Range   WBC 7.6  4.0 - 10.5 K/uL   RBC 4.37  3.87 - 5.11 MIL/uL   Hemoglobin 12.9  12.0 - 15.0 g/dL   HCT 40.9  81.1 - 91.4 %   MCV 90.6  78.0 - 100.0 fL   MCH 29.5  26.0 - 34.0 pg   MCHC 32.6  30.0 - 36.0 g/dL   RDW 78.2  95.6 - 21.3 %   Platelets 220  150 - 400 K/uL   Neutrophils Relative % 41 (*) 43 - 77 %   Neutro Abs 3.1  1.7 - 7.7 K/uL   Lymphocytes Relative 52 (*) 12 - 46 %   Lymphs Abs 3.9  0.7 - 4.0 K/uL   Monocytes Relative 7  3 - 12 %   Monocytes Absolute 0.5  0.1 - 1.0 K/uL   Eosinophils Relative 1  0 - 5 %   Eosinophils Absolute 0.1  0.0 - 0.7 K/uL   Basophils Relative 0  0 - 1 %   Basophils Absolute 0.0  0.0 - 0.1 K/uL  BASIC METABOLIC PANEL      Result Value Range   Sodium 140  135 - 145 mEq/L   Potassium 4.0  3.5 - 5.1 mEq/L   Chloride 97  96 - 112 mEq/L   CO2 26  19 - 32 mEq/L   Glucose, Bld 171 (*) 70 - 99 mg/dL   BUN 10  6 - 23 mg/dL   Creatinine, Ser 0.86  0.50 - 1.10 mg/dL   Calcium 9.1  8.4 - 57.8 mg/dL   GFR calc non Af Amer >90  >90 mL/min   GFR calc Af Amer >90  >90 mL/min    Dg Chest 2 View  07/16/2013   CLINICAL DATA:  Cough, shortness of breath  EXAM: CHEST  2 VIEW  COMPARISON:  06/26/2013  FINDINGS: Cardiomediastinal silhouette  is stable. Right shoulder prosthesis again noted. Stable atelectasis or scarring left base. There is streaky right base atelectasis or infiltrate. No pulmonary edema. Bony thorax is stable.  IMPRESSION: Stable atelectasis or scarring left base. There is streaky right base atelectasis or infiltrate. No pulmonary edema.   Electronically Signed   By: Natasha Mead   On: 07/16/2013 11:22    Dg Chest 2 View  06/26/2013   *RADIOLOGY REPORT*  Clinical Data: Shortness of breath.  Premature atrial tachycardia. Preoperative exam.  Diabetes.  HIV.  Hypertension.  Emphysema and asthma.  20 pack year history of smoking  CHEST - 2 VIEW  Comparison: 05/09/2013 and CT 2012  Findings: Heart and mediastinal contours are stable with heart size upper normal.  The lung fields are notable for stable scarring in the region of the lingula.  Mild bibasilar volume loss is seen with the lungs otherwise clear.  No focal infiltrates or signs of congestive failure seen and no pleural fluid is noted.  No significant peribronchial cuffing is identified.  Bony structures are notable for right shoulder arthroplasty and degenerative changes of the left shoulder girdle.  IMPRESSION: Stable lingular scarring and mild bibasilar volume loss.  No new worrisome focal or acute abnormality suggested   Original Report Authenticated By: Rhodia Albright, M.D.      MDM  No  diagnosis found. Patient is in no acute distress. Chest x-ray shows no obvious pneumonia.  Breathing treatment given.   She is immunocompromised secondary to HIV. Rx Zithromax, Hycodan cough syrup     I personally performed the services described in this documentation, which was scribed in my presence. The recorded information has been reviewed and is accurate.    Donnetta Hutching, MD 07/16/13 1345

## 2013-07-16 NOTE — ED Notes (Signed)
Pt reports was here a couple of weeks ago and diagnosed with URI.  PT says took antibiotics but isn't any better.  C/o abd pain that is worse with labored breathing and coughing.

## 2013-08-07 ENCOUNTER — Other Ambulatory Visit (HOSPITAL_COMMUNITY): Payer: Self-pay | Admitting: Internal Medicine

## 2013-08-07 DIAGNOSIS — Z139 Encounter for screening, unspecified: Secondary | ICD-10-CM

## 2013-08-13 ENCOUNTER — Ambulatory Visit (HOSPITAL_COMMUNITY): Payer: Medicare Other

## 2013-08-13 ENCOUNTER — Telehealth: Payer: Self-pay | Admitting: *Deleted

## 2013-08-13 NOTE — Telephone Encounter (Signed)
Offered patient an appointment for the HRA clinic 11/14, patient wants to think about it a little more, states that the cream seems to be working right now.  Patient will consider this over the weekend, will call back Monday 10/27 with her decision. Andree Coss, RN

## 2013-08-20 ENCOUNTER — Ambulatory Visit (HOSPITAL_COMMUNITY): Payer: Medicare Other

## 2013-08-26 ENCOUNTER — Telehealth: Payer: Self-pay | Admitting: *Deleted

## 2013-08-26 NOTE — Telephone Encounter (Signed)
Called patient regarding scheduling an HRA appt, and she declined. She said she has too much going on right now. [name]

## 2013-09-23 ENCOUNTER — Other Ambulatory Visit: Payer: Self-pay | Admitting: *Deleted

## 2013-09-23 DIAGNOSIS — B20 Human immunodeficiency virus [HIV] disease: Secondary | ICD-10-CM

## 2013-09-23 MED ORDER — DARUNAVIR ETHANOLATE 800 MG PO TABS: 800.0000 mg | ORAL_TABLET | Freq: Every day | ORAL | Status: DC

## 2013-10-05 ENCOUNTER — Telehealth: Payer: Self-pay | Admitting: *Deleted

## 2013-10-05 NOTE — Telephone Encounter (Signed)
Patient has been receiving Isentress 400mg  #30/month for more than 6 months.   Patient's pharmacy called today to inquire why it is only once daily instead of twice daily.  Please advise if the pharmacy should fill #60/month for the patient to begin taking Isentress BID or if she should stop medication at this point - patient has not had labs drawn in 6+ months, has follow up Wednesday 12/17.   Andree Coss, RN

## 2013-10-07 ENCOUNTER — Ambulatory Visit (INDEPENDENT_AMBULATORY_CARE_PROVIDER_SITE_OTHER): Payer: Medicare Other | Admitting: Infectious Diseases

## 2013-10-07 ENCOUNTER — Encounter: Payer: Self-pay | Admitting: Infectious Diseases

## 2013-10-07 VITALS — BP 142/98 | HR 118 | Temp 97.8°F

## 2013-10-07 DIAGNOSIS — R8789 Other abnormal findings in specimens from female genital organs: Secondary | ICD-10-CM

## 2013-10-07 DIAGNOSIS — M25561 Pain in right knee: Secondary | ICD-10-CM

## 2013-10-07 DIAGNOSIS — M25569 Pain in unspecified knee: Secondary | ICD-10-CM

## 2013-10-07 DIAGNOSIS — Z113 Encounter for screening for infections with a predominantly sexual mode of transmission: Secondary | ICD-10-CM

## 2013-10-07 DIAGNOSIS — B20 Human immunodeficiency virus [HIV] disease: Secondary | ICD-10-CM

## 2013-10-07 DIAGNOSIS — F319 Bipolar disorder, unspecified: Secondary | ICD-10-CM

## 2013-10-07 LAB — CBC
HCT: 38.5 % (ref 36.0–46.0)
Hemoglobin: 13.4 g/dL (ref 12.0–15.0)
MCH: 31.6 pg (ref 26.0–34.0)
MCHC: 34.8 g/dL (ref 30.0–36.0)
RDW: 16 % — ABNORMAL HIGH (ref 11.5–15.5)

## 2013-10-07 LAB — COMPREHENSIVE METABOLIC PANEL
AST: 21 U/L (ref 0–37)
Alkaline Phosphatase: 111 U/L (ref 39–117)
BUN: 9 mg/dL (ref 6–23)
Glucose, Bld: 85 mg/dL (ref 70–99)
Sodium: 139 mEq/L (ref 135–145)
Total Bilirubin: 0.4 mg/dL (ref 0.3–1.2)

## 2013-10-07 NOTE — Assessment & Plan Note (Addendum)
Will get her set up for PAP, mammo. Recheck her labs today. Give pt condoms. She refuses vax today (needs flu and Hep B series restart). rtc 6 months. Re-iterate to pt to take ISN BID.

## 2013-10-07 NOTE — Assessment & Plan Note (Signed)
She will f/u with her orthopedist.

## 2013-10-07 NOTE — Assessment & Plan Note (Signed)
Will try to make sure she has f/u at mental health.

## 2013-10-07 NOTE — Assessment & Plan Note (Signed)
Will refer her for f/u appt.

## 2013-10-07 NOTE — Progress Notes (Signed)
   Subjective:    Patient ID: [name], female    DOB: Sep 29, 1961, 52 y.o.   MRN: [id number]  HPI 52 yo F with HIV+, R TKR, bipolar. On DRVr/TRV/ISN. States she has been sick for nearly 1 month. Was going to have L TKR but was having cough and was delayed. Still some cough and wheeze. PCP has been out of office. No fevers, was having some chills and night sweats with her illness, better now. Takes prn xanax for sleep. Throat has been sore. Hoarse voice. Having sinus drainage as well. Has been using nasal inhaler. Nasal lavage. Doing better.   HIV 1 RNA Quant (copies/mL)  Date Value  04/06/2013 <20   11/05/2012 <20   07/23/2012 <20      CD4 T Cell Abs (cmm)  Date Value  04/06/2013 790   11/05/2012 1060   09/26/2011 390*    Review of Systems  Constitutional: Negative for fever, chills and unexpected weight change.  HENT: Positive for sore throat.   Respiratory: Positive for cough.   Gastrointestinal: Negative for diarrhea and constipation.  Genitourinary: Negative for difficulty urinating.  Musculoskeletal: Positive for arthralgias.       Objective:   Physical Exam  Constitutional: She appears well-developed and well-nourished.  HENT:  Mouth/Throat: No oropharyngeal exudate.  Eyes: EOM are normal. Pupils are equal, round, and reactive to light.  Cardiovascular: Normal rate, regular rhythm and normal heart sounds.   Pulmonary/Chest: Effort normal. She has wheezes.  Abdominal: Soft. Bowel sounds are normal. There is no tenderness.  Musculoskeletal: She exhibits no edema.  Lymphadenopathy:    She has no cervical adenopathy.  Psychiatric: Her speech is rapid and/or pressured.          Assessment & Plan:

## 2013-10-07 NOTE — Telephone Encounter (Signed)
fixed

## 2013-10-07 NOTE — Addendum Note (Signed)
Addended by: Ebrahim Deremer C on: 10/07/2013 11:41 AM   Modules accepted: Orders

## 2013-10-08 LAB — T-HELPER CELL (CD4) - (RCID CLINIC ONLY)
CD4 % Helper T Cell: 14 % — ABNORMAL LOW (ref 33–55)
CD4 T Cell Abs: 220 /uL — ABNORMAL LOW (ref 400–2700)

## 2013-10-08 LAB — HIV-1 RNA ULTRAQUANT REFLEX TO GENTYP+: HIV-1 RNA Quant, Log: <1.3 {Log} (ref <1.30)

## 2013-10-20 ENCOUNTER — Telehealth: Payer: Self-pay | Admitting: *Deleted

## 2013-10-20 NOTE — Telephone Encounter (Signed)
Received a release of medical records request from [facility] for this patient and faxed requested information to [phone] today.

## 2013-11-12 ENCOUNTER — Institutional Professional Consult (permissible substitution): Payer: Medicare Other | Admitting: Internal Medicine

## 2013-11-17 ENCOUNTER — Institutional Professional Consult (permissible substitution): Payer: Medicare Other | Admitting: Emergency Medicine

## 2013-11-30 ENCOUNTER — Encounter: Payer: Self-pay | Admitting: Family Medicine

## 2013-11-30 ENCOUNTER — Encounter: Payer: Medicare Other | Admitting: Family Medicine

## 2013-12-02 ENCOUNTER — Institutional Professional Consult (permissible substitution): Payer: Medicare Other | Admitting: Emergency Medicine

## 2013-12-08 ENCOUNTER — Institutional Professional Consult (permissible substitution): Payer: Medicare Other | Admitting: Internal Medicine

## 2013-12-15 ENCOUNTER — Institutional Professional Consult (permissible substitution): Payer: Medicare Other | Admitting: Internal Medicine

## 2013-12-17 ENCOUNTER — Institutional Professional Consult (permissible substitution): Payer: Medicare Other | Admitting: Internal Medicine

## 2013-12-23 ENCOUNTER — Institutional Professional Consult (permissible substitution): Payer: Medicare Other | Admitting: Internal Medicine

## 2014-01-04 ENCOUNTER — Institutional Professional Consult (permissible substitution): Payer: Medicare Other | Admitting: Internal Medicine

## 2014-01-07 ENCOUNTER — Institutional Professional Consult (permissible substitution): Payer: Medicare Other | Admitting: Internal Medicine

## 2014-01-08 ENCOUNTER — Institutional Professional Consult (permissible substitution): Payer: Medicare Other | Admitting: Internal Medicine

## 2014-01-12 ENCOUNTER — Encounter: Payer: Self-pay | Admitting: Internal Medicine

## 2014-01-12 ENCOUNTER — Ambulatory Visit (INDEPENDENT_AMBULATORY_CARE_PROVIDER_SITE_OTHER): Payer: Medicare Other | Admitting: Internal Medicine

## 2014-01-12 VITALS — BP 122/90 | HR 83 | Temp 97.7°F | Ht 64.0 in | Wt 184.0 lb

## 2014-01-12 DIAGNOSIS — J45909 Unspecified asthma, uncomplicated: Secondary | ICD-10-CM

## 2014-01-12 DIAGNOSIS — F17200 Nicotine dependence, unspecified, uncomplicated: Secondary | ICD-10-CM

## 2014-01-12 NOTE — Patient Instructions (Signed)
The key is to stop smoking completely before smoking completely stops you - it is not too late and it's the most important aspect of your care  You are cleared for surgery but ideally need to stop smoking x 2 weeks prior to surgery and wean off prednisone afterwards as tolerated

## 2014-01-12 NOTE — Progress Notes (Signed)
   Subjective:    Patient ID: [name], female    DOB: February 08, 1961 MRN: [id number]  HPI  36 yobf smoker with dx of chronic asthma followed by Dr Ishmael Holter referred 01/12/2014 to pulmonary clinic for preop eval for L knee TKR by Dr Noemi Chapel.   01/12/2014 1st [facility] office visit/ Melvyn Novas / still active smoker  Chief Complaint  Patient presents with  . Pulmonary Consult    Referred per Dr. Ishmael Holter. Pt needing pulmonary clearance for TKR - left knee.  Pt c/o SOB and cough x 1 wk- non prod.   avg neb once daily in summer but otherwise rarely uses  qvar 2 bid and prednisone ? per ortho at 20 mg per day Min day > noct cough, worse first thing in am.  No obvious other patterns in day to day or daytime variabilty or assoc  cp or chest tightness, subjective wheeze overt sinus or hb symptoms. No unusual exp hx or h/o childhood pna/ asthma or knowledge of premature birth.  Sleeping ok without nocturnal  or early am exacerbation  of respiratory  c/o's or need for noct saba. Also denies any obvious fluctuation of symptoms with weather or environmental changes or other aggravating or alleviating factors except as outlined above   Current Medications, Allergies, Complete Past Medical History, Past Surgical History, Family History, and Social History were reviewed in [employer] record.            Review of Systems  Constitutional: Negative for fever, chills and unexpected weight change.  HENT: Positive for sore throat. Negative for congestion, dental problem, ear pain, nosebleeds, postnasal drip, rhinorrhea, sinus pressure, sneezing, trouble swallowing and voice change.   Eyes: Negative for visual disturbance.  Respiratory: Positive for cough and shortness of breath. Negative for choking.   Cardiovascular: Negative for chest pain and leg swelling.  Gastrointestinal: Negative for vomiting, abdominal pain and diarrhea.  Genitourinary: Negative for difficulty urinating.   Musculoskeletal: Negative for arthralgias.  Skin: Negative for rash.  Neurological: Positive for headaches. Negative for tremors and syncope.  Hematological: Does not bruise/bleed easily.       Objective:   Physical Exam  amb wf nad  Wt Readings from Last 3 Encounters:  01/12/14 184 lb (83.462 kg)  07/16/13 179 lb (81.194 kg)  06/26/13 179 lb 3.7 oz (81.3 kg)     HEENT: nl dentition, turbinates, and orophanx. Nl external ear canals without cough reflex   NECK :  without JVD/Nodes/TM/ nl carotid upstrokes bilaterally   LUNGS: no acc muscle use, clear to A and P bilaterally without cough on insp or exp maneuvers   CV:  RRR  no s3 or murmur or increase in P2, no edema   ABD:  soft and nontender with nl excursion in the supine position. No bruits or organomegaly, bowel sounds nl  MS:  warm without deformities, calf tenderness, cyanosis or clubbing  SKIN: warm and dry without lesions    NEURO:  alert, approp, no deficits     cxr 07/16/14  Stable atelectasis or scarring left base. There is streaky right  base atelectasis or infiltrate. No pulmonary edema.        Assessment & Plan:

## 2014-01-13 NOTE — Assessment & Plan Note (Signed)
>   3 m discussion  I reviewed the Flethcher curve with patient that basically indicates  if you quit smoking when your best day FEV1 is still well preserved (as hers is)  it is highly unlikely you will progress to severe disease and informed the patient there was no medication on the market that has proven to change the curve or the likelihood of progression.  Therefore stopping smoking and maintaining abstinence is the most important aspect of care, not choice of inhalers or for that matter, doctors.    In addition, needs to quit anyway ideally x 2 weeks prior to elective surgery so should have good focused motivation to do so at this point.

## 2014-01-13 NOTE — Assessment & Plan Note (Signed)
Spirometry wnl 01/12/14     Effort was erratic but no evidence of airflow obst so her asthma is well controlled at present despite smoking (see sep a/p) and she does not have copd  Would make every effort to taper off prednisone post op but I see no contraindication to knee surgery at this point.

## 2014-01-28 ENCOUNTER — Other Ambulatory Visit: Payer: Self-pay | Admitting: Orthopedic Surgery

## 2014-02-10 ENCOUNTER — Other Ambulatory Visit: Payer: Self-pay | Admitting: Orthopedic Surgery

## 2014-03-05 ENCOUNTER — Other Ambulatory Visit (HOSPITAL_COMMUNITY): Payer: Medicare Other

## 2014-03-08 ENCOUNTER — Encounter (HOSPITAL_COMMUNITY): Payer: Self-pay

## 2014-03-08 ENCOUNTER — Encounter (HOSPITAL_COMMUNITY)
Admission: RE | Admit: 2014-03-08 | Discharge: 2014-03-08 | Disposition: A | Discharge status: Home / Self Care | Payer: Medicare Other | Source: Ambulatory Visit | Attending: Orthopedic Surgery | Admitting: Orthopedic Surgery

## 2014-03-08 DIAGNOSIS — Z01818 Encounter for other preprocedural examination: Secondary | ICD-10-CM | POA: Insufficient documentation

## 2014-03-08 DIAGNOSIS — Z01812 Encounter for preprocedural laboratory examination: Secondary | ICD-10-CM | POA: Insufficient documentation

## 2014-03-08 HISTORY — DX: Adverse effect of unspecified anesthetic, initial encounter: T41.45XA

## 2014-03-08 HISTORY — DX: Other complications of anesthesia, initial encounter: T88.59XA

## 2014-03-08 LAB — TYPE AND SCREEN
ABO/RH(D): A POS
ANTIBODY SCREEN: NEGATIVE

## 2014-03-08 LAB — COMPREHENSIVE METABOLIC PANEL
ALK PHOS: 135 U/L — AB (ref 39–117)
ALT: 44 U/L — AB (ref 0–35)
AST: 80 U/L — AB (ref 0–37)
Albumin: 3.3 g/dL — ABNORMAL LOW (ref 3.5–5.2)
BUN: 10 mg/dL (ref 6–23)
CO2: 26 meq/L (ref 19–32)
Calcium: 8.3 mg/dL — ABNORMAL LOW (ref 8.4–10.5)
Chloride: 92 mEq/L — ABNORMAL LOW (ref 96–112)
Creatinine, Ser: 0.93 mg/dL (ref 0.50–1.10)
GFR calc non Af Amer: 70 mL/min — ABNORMAL LOW (ref >90)
GFR, EST AFRICAN AMERICAN: 81 mL/min — AB (ref >90)
GLUCOSE: 96 mg/dL (ref 70–99)
POTASSIUM: 3.8 meq/L (ref 3.7–5.3)
SODIUM: 136 meq/L — AB (ref 137–147)
Total Bilirubin: 1.3 mg/dL — ABNORMAL HIGH (ref 0.3–1.2)
Total Protein: 7.1 g/dL (ref 6.0–8.3)

## 2014-03-08 LAB — CBC
HCT: 38.2 % (ref 36.0–46.0)
HEMOGLOBIN: 12.8 g/dL (ref 12.0–15.0)
MCH: 30.2 pg (ref 26.0–34.0)
MCHC: 33.5 g/dL (ref 30.0–36.0)
MCV: 90.1 fL (ref 78.0–100.0)
PLATELETS: 181 10*3/uL (ref 150–400)
RBC: 4.24 MIL/uL (ref 3.87–5.11)
RDW: 15.8 % — ABNORMAL HIGH (ref 11.5–15.5)
WBC: 8.1 10*3/uL (ref 4.0–10.5)

## 2014-03-08 LAB — PROTIME-INR
INR: 0.89 (ref 0.00–1.49)
Prothrombin Time: 11.9 seconds (ref 11.6–15.2)

## 2014-03-08 LAB — APTT: APTT: 28 s (ref 24–37)

## 2014-03-08 LAB — SURGICAL PCR SCREEN
MRSA, PCR: NEGATIVE
Staphylococcus aureus: NEGATIVE

## 2014-03-08 NOTE — Pre-Procedure Instructions (Signed)
[name]  03/08/2014   Your procedure is scheduled on:  Tuesday 03/16/14  Report to [facility] Admitting at 530 AM.  Call this number if you have problems the morning of surgery: [phone]   Remember:   Do not eat food or drink liquids after midnight.   Take these medicines the morning of surgery with A SIP OF WATER: XANAX, PREZISTA, ALBUTEROL, QVAR, CLONIDINE, TRUVADA, NEXIUM, OXYCODONE IF NEEDED, DITROPAN, POTASSIUM, ISENTRESS, NORVIR   Do not wear jewelry, make-up or nail polish.  Do not wear lotions, powders, or perfumes. You may wear deodorant.  Do not shave 48 hours prior to surgery. Men may shave face and neck.  Do not bring valuables to the hospital.  [facility] is not responsible                  for any belongings or valuables.               Contacts, dentures or bridgework may not be worn into surgery.  Leave suitcase in the car. After surgery it may be brought to your room.  For patients admitted to the hospital, discharge time is determined by your                treatment team.               Patients discharged the day of surgery will not be allowed to drive  home.  Name and phone number of your driver:   Special Instructions: [place] - Preparing for Surgery  Before surgery, you can play an important role.  Because skin is not sterile, your skin needs to be as free of germs as possible.  You can reduce the number of germs on you skin by washing with CHG (chlorahexidine gluconate) soap before surgery.  CHG is an antiseptic cleaner which kills germs and bonds with the skin to continue killing germs even after washing.  Please DO NOT use if you have an allergy to CHG or antibacterial soaps.  If your skin becomes reddened/irritated stop using the CHG and inform your nurse when you arrive at Short Stay.  Do not shave (including legs and underarms) for at least 48 hours prior to the first CHG shower.  You may shave your face.  Please follow these  instructions carefully:   1.  Shower with CHG Soap the night before surgery and the morning of Surgery.  2.  If you choose to wash your hair, wash your hair first as usual with your normal shampoo.  3.  After you shampoo, rinse your hair and body thoroughly to remove the Shampoo.  4.  Use CHG as you would any other liquid soap. You can apply chg directly to the skin and wash gently with scrungie or a clean washcloth.  5.  Apply the CHG Soap to your body ONLY FROM THE NECK DOWN.  Do not use on open wounds or open sores.  Avoid contact with your eyes, ears, mouth and genitals (private parts).  Wash genitals (private parts with your normal soap.  6.  Wash thoroughly, paying special attention to the area where your surgery will be performed.  7.  Thoroughly rinse your body with warm water from the neck down.  8.  DO NOT shower/wash with your normal soap after using and rinsing off the CHG Soap.  9.  Pat yourself dry with a clean towel.  10.  Wear clean pajamas.            11.  Place clean sheets on your bed the night of your first shower and do not sleep with pets.  Day of Surgery  Do not apply any lotions/deodorants the morning of surgery.  Please wear clean clothes to the hospital/surgery center.  Special Instructions: Incentive Spirometry - Practice and bring it with you on the day of surgery.   Please read over the following fact sheets that you were given: Pain Booklet, Coughing and Deep Breathing, Blood Transfusion Information, Total Joint Packet, MRSA Information and Surgical Site Infection Prevention

## 2014-03-09 NOTE — Progress Notes (Signed)
Anesthesia Chart Review PAT Evaluation: Patient is a 53 year old female scheduled for left TKA on 03/16/14 by Dr. Mardelle Matte. Surgery was initially scheduled for 06/30/13 but was postponed because she presented to PAT feeling poorly with left tooth and left facial pain and slight wheezing with increased use of albuterol, so she was referred for further evaluation.   History includes HIV, admission for CAP with hypoxia 05/29/13, smoking, pancreatitis (evidence of chronic pancreatitis and diffuse hepatic steatosis on 05/29/13 CT), HPV, Bipolar disorder, emphysema, asthma, gastric ulcer, GERD, anxiety, depression, chronic back pain, transfusion age 39, HTN, obesity, hysterectomy, cholecystectomy, right breast lumpectomy, right shoulder hemi-arthroplasty 04/2012. PCP is Dr. Gala Romney. ID is Dr. Johnnye Sima. Pulmonologist is Dr. Melvyn Novas who saw her for a preoperative evaluation on 01/13/14.  Her spirometry was WNL.  He did not see any pulmonary contraindication for knee surgery at that time, but felt ideally it would be best for her to stop smoking two weeks prior to surgery. She is not followed by cardiology, but was evaluated preoperatively by cardiologist Dr. Johnsie Cancel in 03/2012 for EKG showing possible anteroseptal infarct. He cleared her following an echo that showed normal LV systolic function with EF 55-60%. He felt septal changes were related to lead placement.   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 HR had increased when compared to prior 05/30/13 EKG. She has had numerous EKGs since 2013 that intermittently show anterior infarct, so overall I think her EKGs have been stable.   Echo on 04/11/12 showed:  - Left ventricle: The cavity size was normal. There was mild concentric hypertrophy. Systolic function was normal. The estimated ejection fraction was in the range of 55% to 60%. Wall motion was normal; there were no regional wall motion abnormalities. - Atrial  septum: No defect or patent foramen ovale was identified.  CXR on 03/08/14 showed: No acute abnormality seen.    Preoperative labs noted.  PLT 181K, PT/PTT WNL. ALP 135, AST 80/ALT 44, total bilirubin 1.3.  LFTs are newly elevated since 10/07/13, but previously elevated on 04/06/13 (ALP 199, AST 144, ALT 43).  (CT on 05/08/13 showed sequela of prior/chronic pancreatitis, mild intra/extra hepatic ductal dilations, progressed from 2008, common bile duct 11 mm, consider ERCP or MRCP as clinically warranted.  AST/ALT were normal by then and ALP down to 150.  There was no acute intra-abdominal process on repeat CT on 05/29/13).  She is on anti-viral medications darunavir ethanolate, emtiricitabine-tenofovir, raltegravir, and ritonavir which all can have adverse effects of hepatoxicity. She also has hepatic steatosis on abdominal CT 05/2013. I did forward her CMET results to Dr. Johnnye Sima since he prescribes her anti-viral medication, although I don't know that he necessarily had to make any adjustments last year when her LFTs were transiently up.  I also reviewed labs with anesthesiologist Dr. Linna Caprice.  Will plan to repeat HFP on the day of surgery to ensure stability.  If no significant increase and otherwise no acute changes then it is anticipated that she can proceed as planned.    [facility]/Anesthesiology Phone [phone] 03/09/2014 3:19 PM

## 2014-03-15 MED ORDER — CEFAZOLIN SODIUM-DEXTROSE 2-3 GM-% IV SOLR
2.0000 g | INTRAVENOUS | Status: AC
  Administered 2014-03-16: 2 g via INTRAVENOUS
  Filled 2014-03-15: qty 50

## 2014-03-16 ENCOUNTER — Encounter (HOSPITAL_COMMUNITY): Payer: Medicare Other | Admitting: Vascular Surgery

## 2014-03-16 ENCOUNTER — Encounter (HOSPITAL_COMMUNITY): Payer: Self-pay | Admitting: Orthopedic Surgery

## 2014-03-16 ENCOUNTER — Inpatient Hospital Stay (HOSPITAL_COMMUNITY): Payer: Medicare Other

## 2014-03-16 ENCOUNTER — Encounter (HOSPITAL_COMMUNITY)
Admission: RE | Disposition: A | Discharge status: Skilled Nursing Facility | Payer: Self-pay | Source: Ambulatory Visit | Attending: Orthopedic Surgery

## 2014-03-16 ENCOUNTER — Inpatient Hospital Stay (HOSPITAL_COMMUNITY)
Admission: RE | Admit: 2014-03-16 | Discharge: 2014-03-22 | DRG: 469 | Disposition: A | Discharge status: Skilled Nursing Facility | Payer: Medicare Other | Source: Ambulatory Visit | Attending: Orthopedic Surgery | Admitting: Orthopedic Surgery

## 2014-03-16 ENCOUNTER — Inpatient Hospital Stay (HOSPITAL_COMMUNITY): Payer: Medicare Other | Admitting: Anesthesiology

## 2014-03-16 DIAGNOSIS — G8929 Other chronic pain: Secondary | ICD-10-CM | POA: Diagnosis present

## 2014-03-16 DIAGNOSIS — F17200 Nicotine dependence, unspecified, uncomplicated: Secondary | ICD-10-CM | POA: Diagnosis present

## 2014-03-16 DIAGNOSIS — Z8542 Personal history of malignant neoplasm of other parts of uterus: Secondary | ICD-10-CM | POA: Diagnosis not present

## 2014-03-16 DIAGNOSIS — F10231 Alcohol dependence with withdrawal delirium: Secondary | ICD-10-CM | POA: Diagnosis not present

## 2014-03-16 DIAGNOSIS — M25569 Pain in unspecified knee: Secondary | ICD-10-CM | POA: Diagnosis present

## 2014-03-16 DIAGNOSIS — M549 Dorsalgia, unspecified: Secondary | ICD-10-CM | POA: Diagnosis present

## 2014-03-16 DIAGNOSIS — B20 Human immunodeficiency virus [HIV] disease: Secondary | ICD-10-CM | POA: Diagnosis present

## 2014-03-16 DIAGNOSIS — F10931 Alcohol use, unspecified with withdrawal delirium: Secondary | ICD-10-CM | POA: Diagnosis not present

## 2014-03-16 DIAGNOSIS — K219 Gastro-esophageal reflux disease without esophagitis: Secondary | ICD-10-CM | POA: Diagnosis present

## 2014-03-16 DIAGNOSIS — F1020 Alcohol dependence, uncomplicated: Secondary | ICD-10-CM | POA: Diagnosis present

## 2014-03-16 DIAGNOSIS — M1712 Unilateral primary osteoarthritis, left knee: Secondary | ICD-10-CM | POA: Diagnosis present

## 2014-03-16 DIAGNOSIS — I10 Essential (primary) hypertension: Secondary | ICD-10-CM | POA: Diagnosis present

## 2014-03-16 DIAGNOSIS — Z79899 Other long term (current) drug therapy: Secondary | ICD-10-CM

## 2014-03-16 DIAGNOSIS — F319 Bipolar disorder, unspecified: Secondary | ICD-10-CM | POA: Diagnosis present

## 2014-03-16 DIAGNOSIS — M179 Osteoarthritis of knee, unspecified: Secondary | ICD-10-CM | POA: Diagnosis present

## 2014-03-16 DIAGNOSIS — H269 Unspecified cataract: Secondary | ICD-10-CM | POA: Diagnosis present

## 2014-03-16 DIAGNOSIS — F411 Generalized anxiety disorder: Secondary | ICD-10-CM | POA: Diagnosis present

## 2014-03-16 DIAGNOSIS — J438 Other emphysema: Secondary | ICD-10-CM | POA: Diagnosis present

## 2014-03-16 DIAGNOSIS — D62 Acute posthemorrhagic anemia: Secondary | ICD-10-CM | POA: Diagnosis not present

## 2014-03-16 DIAGNOSIS — M1710 Unilateral primary osteoarthritis, unspecified knee: Principal | ICD-10-CM | POA: Diagnosis present

## 2014-03-16 HISTORY — DX: Unilateral primary osteoarthritis, left knee: M17.12

## 2014-03-16 HISTORY — DX: Alcohol dependence with withdrawal delirium: F10.231

## 2014-03-16 HISTORY — DX: Malignant neoplasm of uterus, part unspecified: C55

## 2014-03-16 HISTORY — PX: TOTAL KNEE ARTHROPLASTY: SHX125

## 2014-03-16 LAB — HEPATIC FUNCTION PANEL
ALT: 56 U/L — ABNORMAL HIGH (ref 0–35)
AST: 110 U/L — AB (ref 0–37)
Albumin: 3.1 g/dL — ABNORMAL LOW (ref 3.5–5.2)
Alkaline Phosphatase: 138 U/L — ABNORMAL HIGH (ref 39–117)
BILIRUBIN DIRECT: 0.3 mg/dL (ref 0.0–0.3)
Indirect Bilirubin: 0.3 mg/dL (ref 0.3–0.9)
Total Bilirubin: 0.6 mg/dL (ref 0.3–1.2)
Total Protein: 6.9 g/dL (ref 6.0–8.3)

## 2014-03-16 SURGERY — ARTHROPLASTY, KNEE, TOTAL
Anesthesia: Regional | Site: Knee | Laterality: Left

## 2014-03-16 MED ORDER — BUPIVACAINE-EPINEPHRINE (PF) 0.5% -1:200000 IJ SOLN
INTRAMUSCULAR | Status: DC | PRN
  Administered 2014-03-16: 30 mL

## 2014-03-16 MED ORDER — RIVAROXABAN 10 MG PO TABS: 10.0000 mg | ORAL_TABLET | Freq: Every day | ORAL | Status: DC

## 2014-03-16 MED ORDER — OXYCODONE HCL 5 MG/5ML PO SOLN: 5.0000 mg | Freq: Once | ORAL | Status: AC | PRN

## 2014-03-16 MED ORDER — METOCLOPRAMIDE HCL 10 MG PO TABS: 5.0000 mg | ORAL_TABLET | Freq: Three times a day (TID) | ORAL | Status: DC | PRN

## 2014-03-16 MED ORDER — LABETALOL HCL 5 MG/ML IV SOLN
5.0000 mg | Freq: Once | INTRAVENOUS | Status: AC
  Administered 2014-03-16: 5 mg via INTRAVENOUS

## 2014-03-16 MED ORDER — ONDANSETRON HCL 4 MG PO TABS
4.0000 mg | ORAL_TABLET | Freq: Four times a day (QID) | ORAL | Status: DC | PRN
  Administered 2014-03-18: 4 mg via ORAL

## 2014-03-16 MED ORDER — GLYCOPYRROLATE 0.2 MG/ML IJ SOLN
INTRAMUSCULAR | Status: AC
  Filled 2014-03-16: qty 3

## 2014-03-16 MED ORDER — NEOSTIGMINE METHYLSULFATE 10 MG/10ML IV SOLN
INTRAVENOUS | Status: DC | PRN
  Administered 2014-03-16: 4 mg via INTRAVENOUS

## 2014-03-16 MED ORDER — MAGNESIUM CITRATE PO SOLN: 1.0000 | Freq: Once | ORAL | Status: AC | PRN

## 2014-03-16 MED ORDER — METOCLOPRAMIDE HCL 5 MG/ML IJ SOLN: 5.0000 mg | Freq: Three times a day (TID) | INTRAMUSCULAR | Status: DC | PRN

## 2014-03-16 MED ORDER — FOLIC ACID 1 MG PO TABS
1.0000 mg | ORAL_TABLET | Freq: Every day | ORAL | Status: DC
  Administered 2014-03-16 – 2014-03-19 (×4): 1 mg via ORAL
  Filled 2014-03-16 (×4): qty 1

## 2014-03-16 MED ORDER — FLUTICASONE PROPIONATE HFA 44 MCG/ACT IN AERO
2.0000 | INHALATION_SPRAY | Freq: Two times a day (BID) | RESPIRATORY_TRACT | Status: DC
  Administered 2014-03-16 – 2014-03-22 (×11): 2 via RESPIRATORY_TRACT
  Filled 2014-03-16 (×2): qty 10.6

## 2014-03-16 MED ORDER — ENOXAPARIN SODIUM 30 MG/0.3ML ~~LOC~~ SOLN: 30.0000 mg | Freq: Two times a day (BID) | SUBCUTANEOUS | Status: DC

## 2014-03-16 MED ORDER — PHENOL 1.4 % MT LIQD
1.0000 | OROMUCOSAL | Status: DC | PRN
  Administered 2014-03-16: 1 via OROMUCOSAL
  Filled 2014-03-16: qty 177

## 2014-03-16 MED ORDER — LACTATED RINGERS IV SOLN
INTRAVENOUS | Status: DC | PRN
  Administered 2014-03-16 (×2): via INTRAVENOUS

## 2014-03-16 MED ORDER — CEFAZOLIN SODIUM-DEXTROSE 2-3 GM-% IV SOLR: 2.0000 g | INTRAVENOUS | Status: DC

## 2014-03-16 MED ORDER — OXYCODONE HCL 5 MG PO TABS
ORAL_TABLET | ORAL | Status: AC
  Filled 2014-03-16: qty 1

## 2014-03-16 MED ORDER — SENNA-DOCUSATE SODIUM 8.6-50 MG PO TABS: 2.0000 | ORAL_TABLET | Freq: Every day | ORAL | Status: DC

## 2014-03-16 MED ORDER — SODIUM CHLORIDE 0.9 % IR SOLN
Status: DC | PRN
  Administered 2014-03-16: 1000 mL

## 2014-03-16 MED ORDER — GUAIFENESIN 100 MG/5ML PO SYRP
200.0000 mg | ORAL_SOLUTION | ORAL | Status: DC | PRN
  Administered 2014-03-16: 200 mg via ORAL
  Filled 2014-03-16: qty 10

## 2014-03-16 MED ORDER — LIDOCAINE HCL (CARDIAC) 20 MG/ML IV SOLN
INTRAVENOUS | Status: AC
  Filled 2014-03-16: qty 5

## 2014-03-16 MED ORDER — POTASSIUM CHLORIDE IN NACL 20-0.45 MEQ/L-% IV SOLN
INTRAVENOUS | Status: DC
  Administered 2014-03-16: 12:00:00 via INTRAVENOUS
  Filled 2014-03-16 (×13): qty 1000

## 2014-03-16 MED ORDER — FENTANYL CITRATE 0.05 MG/ML IJ SOLN
INTRAMUSCULAR | Status: DC | PRN
  Administered 2014-03-16 (×2): 50 ug via INTRAVENOUS
  Administered 2014-03-16: 100 ug via INTRAVENOUS
  Administered 2014-03-16 (×4): 50 ug via INTRAVENOUS

## 2014-03-16 MED ORDER — LORATADINE 10 MG PO TABS
10.0000 mg | ORAL_TABLET | Freq: Every day | ORAL | Status: DC
  Administered 2014-03-16 – 2014-03-21 (×6): 10 mg via ORAL
  Filled 2014-03-16 (×8): qty 1

## 2014-03-16 MED ORDER — KETOROLAC TROMETHAMINE 15 MG/ML IJ SOLN
7.5000 mg | Freq: Four times a day (QID) | INTRAMUSCULAR | Status: AC
  Administered 2014-03-16 – 2014-03-17 (×4): 7.5 mg via INTRAVENOUS
  Filled 2014-03-16 (×3): qty 1

## 2014-03-16 MED ORDER — ONDANSETRON HCL 4 MG PO TABS
4.0000 mg | ORAL_TABLET | Freq: Three times a day (TID) | ORAL | Status: DC | PRN
  Administered 2014-03-18: 4 mg via ORAL
  Filled 2014-03-16 (×2): qty 1

## 2014-03-16 MED ORDER — ENOXAPARIN SODIUM 30 MG/0.3ML ~~LOC~~ SOLN
30.0000 mg | Freq: Two times a day (BID) | SUBCUTANEOUS | Status: DC
  Administered 2014-03-17 – 2014-03-22 (×11): 30 mg via SUBCUTANEOUS
  Filled 2014-03-16 (×15): qty 0.3

## 2014-03-16 MED ORDER — ONDANSETRON HCL 4 MG/2ML IJ SOLN
4.0000 mg | Freq: Four times a day (QID) | INTRAMUSCULAR | Status: DC | PRN
Start: 2014-03-16 — End: 2014-03-22

## 2014-03-16 MED ORDER — HYDROMORPHONE HCL 2 MG PO TABS: 2.0000 mg | ORAL_TABLET | ORAL | Status: DC | PRN

## 2014-03-16 MED ORDER — PROMETHAZINE HCL 25 MG PO TABS: 25.0000 mg | ORAL_TABLET | Freq: Four times a day (QID) | ORAL | Status: DC | PRN

## 2014-03-16 MED ORDER — AZELASTINE HCL 0.15 % NA SOLN: 1.0000 | Freq: Every day | NASAL | Status: DC | PRN

## 2014-03-16 MED ORDER — PANTOPRAZOLE SODIUM 40 MG PO TBEC
80.0000 mg | DELAYED_RELEASE_TABLET | Freq: Every day | ORAL | Status: DC
  Administered 2014-03-17 – 2014-03-22 (×6): 80 mg via ORAL
  Filled 2014-03-16 (×3): qty 2

## 2014-03-16 MED ORDER — PHENAZOPYRIDINE HCL 200 MG PO TABS
200.0000 mg | ORAL_TABLET | Freq: Every day | ORAL | Status: DC | PRN
  Filled 2014-03-16: qty 1

## 2014-03-16 MED ORDER — BISACODYL 5 MG PO TBEC: 5.0000 mg | DELAYED_RELEASE_TABLET | Freq: Every day | ORAL | Status: DC | PRN

## 2014-03-16 MED ORDER — METHOCARBAMOL 750 MG PO TABS: 750.0000 mg | ORAL_TABLET | Freq: Four times a day (QID) | ORAL | Status: DC | PRN

## 2014-03-16 MED ORDER — ROCURONIUM BROMIDE 100 MG/10ML IV SOLN
INTRAVENOUS | Status: DC | PRN
  Administered 2014-03-16: 50 mg via INTRAVENOUS

## 2014-03-16 MED ORDER — LABETALOL HCL 5 MG/ML IV SOLN
INTRAVENOUS | Status: AC
  Filled 2014-03-16: qty 4

## 2014-03-16 MED ORDER — MIDAZOLAM HCL 2 MG/2ML IJ SOLN
INTRAMUSCULAR | Status: AC
Start: 2014-03-16 — End: 2014-03-16
  Filled 2014-03-16: qty 2

## 2014-03-16 MED ORDER — METHOCARBAMOL 500 MG PO TABS
500.0000 mg | ORAL_TABLET | Freq: Four times a day (QID) | ORAL | Status: DC | PRN
  Administered 2014-03-16 – 2014-03-22 (×11): 500 mg via ORAL
  Filled 2014-03-16 (×11): qty 1

## 2014-03-16 MED ORDER — GLYCOPYRROLATE 0.2 MG/ML IJ SOLN
INTRAMUSCULAR | Status: DC | PRN
  Administered 2014-03-16: 0.6 mg via INTRAVENOUS

## 2014-03-16 MED ORDER — VERAPAMIL HCL ER 240 MG PO TBCR
240.0000 mg | EXTENDED_RELEASE_TABLET | Freq: Every day | ORAL | Status: DC
  Administered 2014-03-16 – 2014-03-22 (×7): 240 mg via ORAL
  Filled 2014-03-16 (×7): qty 1

## 2014-03-16 MED ORDER — ONDANSETRON HCL 4 MG/2ML IJ SOLN: 4.0000 mg | Freq: Four times a day (QID) | INTRAMUSCULAR | Status: DC | PRN

## 2014-03-16 MED ORDER — FENTANYL CITRATE 0.05 MG/ML IJ SOLN
INTRAMUSCULAR | Status: AC
  Filled 2014-03-16: qty 5

## 2014-03-16 MED ORDER — OXYCODONE HCL 5 MG PO TABS
5.0000 mg | ORAL_TABLET | ORAL | Status: DC | PRN
  Administered 2014-03-17 – 2014-03-19 (×8): 10 mg via ORAL
  Filled 2014-03-16 (×8): qty 2

## 2014-03-16 MED ORDER — DIPHENOXYLATE-ATROPINE 2.5-0.025 MG PO TABS: 1.0000 | ORAL_TABLET | Freq: Four times a day (QID) | ORAL | Status: DC | PRN

## 2014-03-16 MED ORDER — OXYCODONE-ACETAMINOPHEN 10-325 MG PO TABS: 1.0000 | ORAL_TABLET | Freq: Four times a day (QID) | ORAL | Status: DC | PRN

## 2014-03-16 MED ORDER — RIVAROXABAN 10 MG PO TABS
10.0000 mg | ORAL_TABLET | Freq: Every day | ORAL | Status: DC
  Filled 2014-03-16: qty 1

## 2014-03-16 MED ORDER — KETOROLAC TROMETHAMINE 15 MG/ML IJ SOLN
INTRAMUSCULAR | Status: AC
  Filled 2014-03-16: qty 1

## 2014-03-16 MED ORDER — RITONAVIR 100 MG PO CAPS
100.0000 mg | ORAL_CAPSULE | Freq: Every day | ORAL | Status: DC
  Administered 2014-03-17 – 2014-03-22 (×6): 100 mg via ORAL
  Filled 2014-03-16 (×8): qty 1

## 2014-03-16 MED ORDER — PROPOFOL 10 MG/ML IV BOLUS
INTRAVENOUS | Status: AC
  Filled 2014-03-16: qty 20

## 2014-03-16 MED ORDER — ARTIFICIAL TEARS OP OINT
TOPICAL_OINTMENT | OPHTHALMIC | Status: AC
  Filled 2014-03-16: qty 3.5

## 2014-03-16 MED ORDER — DOCUSATE SODIUM 100 MG PO CAPS
100.0000 mg | ORAL_CAPSULE | Freq: Two times a day (BID) | ORAL | Status: DC
  Administered 2014-03-16 – 2014-03-22 (×13): 100 mg via ORAL
  Filled 2014-03-16 (×13): qty 1

## 2014-03-16 MED ORDER — FLUTICASONE PROPIONATE 50 MCG/ACT NA SUSP
1.0000 | Freq: Every day | NASAL | Status: DC | PRN
  Administered 2014-03-17 – 2014-03-20 (×4): 1 via NASAL
  Filled 2014-03-16 (×2): qty 16

## 2014-03-16 MED ORDER — ACETAMINOPHEN 325 MG PO TABS: 650.0000 mg | ORAL_TABLET | Freq: Four times a day (QID) | ORAL | Status: DC | PRN

## 2014-03-16 MED ORDER — ONDANSETRON HCL 4 MG/2ML IJ SOLN
INTRAMUSCULAR | Status: AC
  Filled 2014-03-16: qty 2

## 2014-03-16 MED ORDER — ROCURONIUM BROMIDE 50 MG/5ML IV SOLN
INTRAVENOUS | Status: AC
  Filled 2014-03-16: qty 1

## 2014-03-16 MED ORDER — MIDAZOLAM HCL 5 MG/5ML IJ SOLN
INTRAMUSCULAR | Status: DC | PRN
Start: 2014-03-16 — End: 2014-03-16
  Administered 2014-03-16: 2 mg via INTRAVENOUS

## 2014-03-16 MED ORDER — ONDANSETRON HCL 4 MG/2ML IJ SOLN
INTRAMUSCULAR | Status: DC | PRN
  Administered 2014-03-16: 4 mg via INTRAVENOUS

## 2014-03-16 MED ORDER — DEXAMETHASONE SODIUM PHOSPHATE 10 MG/ML IJ SOLN
10.0000 mg | Freq: Three times a day (TID) | INTRAMUSCULAR | Status: AC
  Administered 2014-03-16: 10 mg via INTRAVENOUS
  Filled 2014-03-16 (×3): qty 1

## 2014-03-16 MED ORDER — NEOSTIGMINE METHYLSULFATE 10 MG/10ML IV SOLN
INTRAVENOUS | Status: AC
  Filled 2014-03-16: qty 1

## 2014-03-16 MED ORDER — MENTHOL 3 MG MT LOZG: 1.0000 | LOZENGE | OROMUCOSAL | Status: DC | PRN

## 2014-03-16 MED ORDER — POTASSIUM CHLORIDE ER 10 MEQ PO TBCR
10.0000 meq | EXTENDED_RELEASE_TABLET | Freq: Every day | ORAL | Status: DC
  Administered 2014-03-17 – 2014-03-22 (×6): 10 meq via ORAL
  Filled 2014-03-16 (×7): qty 1

## 2014-03-16 MED ORDER — ACETAMINOPHEN 650 MG RE SUPP: 650.0000 mg | Freq: Four times a day (QID) | RECTAL | Status: DC | PRN

## 2014-03-16 MED ORDER — METHOCARBAMOL 1000 MG/10ML IJ SOLN: 500.0000 mg | Freq: Four times a day (QID) | INTRAVENOUS | Status: DC | PRN

## 2014-03-16 MED ORDER — PROPOFOL 10 MG/ML IV BOLUS
INTRAVENOUS | Status: DC | PRN
  Administered 2014-03-16: 200 mg via INTRAVENOUS

## 2014-03-16 MED ORDER — OXYCODONE HCL 5 MG PO TABS
5.0000 mg | ORAL_TABLET | Freq: Once | ORAL | Status: AC | PRN
  Administered 2014-03-16: 5 mg via ORAL

## 2014-03-16 MED ORDER — DEXAMETHASONE 6 MG PO TABS
10.0000 mg | ORAL_TABLET | Freq: Three times a day (TID) | ORAL | Status: AC
  Administered 2014-03-16 – 2014-03-17 (×2): 10 mg via ORAL
  Filled 2014-03-16 (×3): qty 1

## 2014-03-16 MED ORDER — ALBUTEROL SULFATE (2.5 MG/3ML) 0.083% IN NEBU: 2.5000 mg | INHALATION_SOLUTION | RESPIRATORY_TRACT | Status: DC | PRN

## 2014-03-16 MED ORDER — CLONIDINE HCL 0.1 MG PO TABS
0.1000 mg | ORAL_TABLET | Freq: Every day | ORAL | Status: DC
  Administered 2014-03-17 – 2014-03-21 (×5): 0.1 mg via ORAL
  Filled 2014-03-16 (×7): qty 1

## 2014-03-16 MED ORDER — ALPRAZOLAM 0.5 MG PO TABS
1.0000 mg | ORAL_TABLET | Freq: Two times a day (BID) | ORAL | Status: DC | PRN
  Administered 2014-03-16 – 2014-03-21 (×7): 1 mg via ORAL
  Filled 2014-03-16 (×8): qty 2

## 2014-03-16 MED ORDER — HYDROMORPHONE HCL PF 1 MG/ML IJ SOLN
1.0000 mg | INTRAMUSCULAR | Status: DC | PRN
  Administered 2014-03-16 – 2014-03-18 (×7): 1 mg via INTRAVENOUS
  Filled 2014-03-16 (×7): qty 1

## 2014-03-16 MED ORDER — NAPHAZOLINE-PHENIRAMINE 0.025-0.3 % OP SOLN
1.0000 [drp] | Freq: Four times a day (QID) | OPHTHALMIC | Status: DC | PRN
  Filled 2014-03-16: qty 15

## 2014-03-16 MED ORDER — BISACODYL 10 MG RE SUPP
10.0000 mg | Freq: Every day | RECTAL | Status: DC | PRN
Start: 2014-03-16 — End: 2014-03-22

## 2014-03-16 MED ORDER — DEXTROSE 5 % IV SOLN
INTRAVENOUS | Status: DC | PRN
  Administered 2014-03-16: 08:00:00 via INTRAVENOUS

## 2014-03-16 MED ORDER — EMTRICITABINE-TENOFOVIR DF 200-300 MG PO TABS
1.0000 | ORAL_TABLET | Freq: Every day | ORAL | Status: DC
  Administered 2014-03-17 – 2014-03-22 (×6): 1 via ORAL
  Filled 2014-03-16 (×6): qty 1

## 2014-03-16 MED ORDER — ALBUTEROL SULFATE (2.5 MG/3ML) 0.083% IN NEBU: 2.5000 mg | INHALATION_SOLUTION | Freq: Four times a day (QID) | RESPIRATORY_TRACT | Status: DC | PRN

## 2014-03-16 MED ORDER — DIPHENHYDRAMINE HCL 12.5 MG/5ML PO ELIX
12.5000 mg | ORAL_SOLUTION | ORAL | Status: DC | PRN
Start: 2014-03-16 — End: 2014-03-22
  Administered 2014-03-18: 25 mg via ORAL
  Filled 2014-03-16: qty 10

## 2014-03-16 MED ORDER — ZOLPIDEM TARTRATE 5 MG PO TABS
5.0000 mg | ORAL_TABLET | Freq: Every evening | ORAL | Status: DC | PRN
  Administered 2014-03-17 – 2014-03-19 (×2): 5 mg via ORAL
  Filled 2014-03-16 (×2): qty 1

## 2014-03-16 MED ORDER — NICOTINE 21 MG/24HR TD PT24
21.0000 mg | MEDICATED_PATCH | Freq: Every day | TRANSDERMAL | Status: DC
  Administered 2014-03-16 – 2014-03-22 (×8): 21 mg via TRANSDERMAL
  Filled 2014-03-16 (×8): qty 1

## 2014-03-16 MED ORDER — CYANOCOBALAMIN 1000 MCG/ML IJ SOLN
1000.0000 ug | INTRAMUSCULAR | Status: DC
  Filled 2014-03-16: qty 1

## 2014-03-16 MED ORDER — LIDOCAINE HCL (CARDIAC) 20 MG/ML IV SOLN
INTRAVENOUS | Status: DC | PRN
  Administered 2014-03-16: 90 mg via INTRAVENOUS

## 2014-03-16 MED ORDER — DIAZEPAM 5 MG PO TABS
5.0000 mg | ORAL_TABLET | Freq: Two times a day (BID) | ORAL | Status: DC | PRN
  Administered 2014-03-18 – 2014-03-21 (×6): 5 mg via ORAL
  Filled 2014-03-16 (×6): qty 1

## 2014-03-16 MED ORDER — ALUM & MAG HYDROXIDE-SIMETH 200-200-20 MG/5ML PO SUSP: 30.0000 mL | ORAL | Status: DC | PRN

## 2014-03-16 MED ORDER — VITAMIN D (ERGOCALCIFEROL) 1.25 MG (50000 UNIT) PO CAPS
50000.0000 [IU] | ORAL_CAPSULE | ORAL | Status: DC
  Administered 2014-03-16: 50000 [IU] via ORAL
  Filled 2014-03-16: qty 1

## 2014-03-16 MED ORDER — HYDROMORPHONE HCL PF 1 MG/ML IJ SOLN
INTRAMUSCULAR | Status: AC
  Filled 2014-03-16: qty 1

## 2014-03-16 MED ORDER — PREDNISONE 20 MG PO TABS
20.0000 mg | ORAL_TABLET | Freq: Every day | ORAL | Status: DC
  Administered 2014-03-17 – 2014-03-22 (×6): 20 mg via ORAL
  Filled 2014-03-16 (×7): qty 1

## 2014-03-16 MED ORDER — RALTEGRAVIR POTASSIUM 400 MG PO TABS
400.0000 mg | ORAL_TABLET | Freq: Two times a day (BID) | ORAL | Status: DC
  Administered 2014-03-16 – 2014-03-22 (×12): 400 mg via ORAL
  Filled 2014-03-16 (×14): qty 1

## 2014-03-16 MED ORDER — CEFAZOLIN SODIUM-DEXTROSE 2-3 GM-% IV SOLR
2.0000 g | Freq: Four times a day (QID) | INTRAVENOUS | Status: AC
  Administered 2014-03-16 (×2): 2 g via INTRAVENOUS
  Filled 2014-03-16 (×2): qty 50

## 2014-03-16 MED ORDER — SENNA 8.6 MG PO TABS
1.0000 | ORAL_TABLET | Freq: Two times a day (BID) | ORAL | Status: DC
  Administered 2014-03-16 – 2014-03-22 (×13): 8.6 mg via ORAL
  Filled 2014-03-16 (×14): qty 1

## 2014-03-16 MED ORDER — POLYETHYLENE GLYCOL 3350 17 G PO PACK: 17.0000 g | PACK | Freq: Every day | ORAL | Status: DC | PRN

## 2014-03-16 MED ORDER — DARUNAVIR ETHANOLATE 800 MG PO TABS
800.0000 mg | ORAL_TABLET | Freq: Every day | ORAL | Status: DC
  Administered 2014-03-17 – 2014-03-22 (×6): 800 mg via ORAL
  Filled 2014-03-16 (×8): qty 1

## 2014-03-16 MED ORDER — HYDROMORPHONE HCL PF 1 MG/ML IJ SOLN
0.2500 mg | INTRAMUSCULAR | Status: DC | PRN
  Administered 2014-03-16: 0.25 mg via INTRAVENOUS

## 2014-03-16 MED ORDER — OXYBUTYNIN CHLORIDE ER 10 MG PO TB24
10.0000 mg | ORAL_TABLET | Freq: Two times a day (BID) | ORAL | Status: DC
  Administered 2014-03-16 – 2014-03-22 (×13): 10 mg via ORAL
  Filled 2014-03-16 (×14): qty 1

## 2014-03-16 MED ORDER — METHOCARBAMOL 500 MG PO TABS
ORAL_TABLET | ORAL | Status: AC
  Filled 2014-03-16: qty 1

## 2014-03-16 SURGICAL SUPPLY — 62 items
APL SKNCLS STERI-STRIP NONHPOA (GAUZE/BANDAGES/DRESSINGS) ×1
BANDAGE ELASTIC 6 VELCRO ST LF (GAUZE/BANDAGES/DRESSINGS) ×6 IMPLANT
BANDAGE ESMARK 6X9 LF (GAUZE/BANDAGES/DRESSINGS) ×1 IMPLANT
BENZOIN TINCTURE PRP APPL 2/3 (GAUZE/BANDAGES/DRESSINGS) ×3 IMPLANT
BLADE SAG 18X100X1.27 (BLADE) ×3 IMPLANT
BLADE SAW RECIP 87.9 MT (BLADE) ×3 IMPLANT
BLADE SAW SGTL 13X75X1.27 (BLADE) ×3 IMPLANT
BNDG CMPR 9X6 STRL LF SNTH (GAUZE/BANDAGES/DRESSINGS) ×1
BNDG ESMARK 6X9 LF (GAUZE/BANDAGES/DRESSINGS) ×3
BOOTCOVER CLEANROOM LRG (PROTECTIVE WEAR) ×6 IMPLANT
BOWL SMART MIX CTS (DISPOSABLE) ×3 IMPLANT
CAPT FB KNEE W/COCR XLINK ×3 IMPLANT
CEMENT HV SMART SET (Cement) ×6 IMPLANT
CLOSURE STERI-STRIP 1/2X4 (GAUZE/BANDAGES/DRESSINGS) ×2
CLSR STERI-STRIP ANTIMIC 1/2X4 (GAUZE/BANDAGES/DRESSINGS) ×4 IMPLANT
COVER SURGICAL LIGHT HANDLE (MISCELLANEOUS) ×3 IMPLANT
CUFF TOURNIQUET SINGLE 34IN LL (TOURNIQUET CUFF) ×3 IMPLANT
DRAPE EXTREMITY T 121X128X90 (DRAPE) ×3 IMPLANT
DRAPE PROXIMA HALF (DRAPES) ×6 IMPLANT
DRAPE U-SHAPE 47X51 STRL (DRAPES) ×3 IMPLANT
DRSG PAD ABDOMINAL 8X10 ST (GAUZE/BANDAGES/DRESSINGS) ×6 IMPLANT
DURAPREP 26ML APPLICATOR (WOUND CARE) ×3 IMPLANT
ELECT CAUTERY BLADE 6.4 (BLADE) ×3 IMPLANT
ELECT REM PT RETURN 9FT ADLT (ELECTROSURGICAL) ×3
ELECTRODE REM PT RTRN 9FT ADLT (ELECTROSURGICAL) ×1 IMPLANT
FACESHIELD WRAPAROUND (MASK) ×3 IMPLANT
GLOVE BIOGEL PI ORTHO PRO SZ8 (GLOVE) ×4
GLOVE ORTHO TXT STRL SZ7.5 (GLOVE) ×3 IMPLANT
GLOVE PI ORTHO PRO STRL SZ8 (GLOVE) ×2 IMPLANT
GLOVE SURG ORTHO 8.0 STRL STRW (GLOVE) ×3 IMPLANT
GOWN STRL REUS W/ TWL XL LVL3 (GOWN DISPOSABLE) ×1 IMPLANT
GOWN STRL REUS W/TWL 2XL LVL3 (GOWN DISPOSABLE) ×3 IMPLANT
GOWN STRL REUS W/TWL XL LVL3 (GOWN DISPOSABLE) ×3
HANDPIECE INTERPULSE COAX TIP (DISPOSABLE) ×3
HOOD PEEL AWAY FACE SHEILD DIS (HOOD) ×6 IMPLANT
IMMOBILIZER KNEE 22 (SOFTGOODS) ×3 IMPLANT
IMMOBILIZER KNEE 22 UNIV (SOFTGOODS) ×3 IMPLANT
KIT BASIN OR (CUSTOM PROCEDURE TRAY) ×3 IMPLANT
KIT ROOM TURNOVER OR (KITS) ×3 IMPLANT
MANIFOLD NEPTUNE II (INSTRUMENTS) ×3 IMPLANT
NS IRRIG 1000ML POUR BTL (IV SOLUTION) ×3 IMPLANT
PACK TOTAL JOINT (CUSTOM PROCEDURE TRAY) ×3 IMPLANT
PAD ABD 8X10 STRL (GAUZE/BANDAGES/DRESSINGS) ×3 IMPLANT
PAD ARMBOARD 7.5X6 YLW CONV (MISCELLANEOUS) ×6 IMPLANT
PAD CAST 4YDX4 CTTN HI CHSV (CAST SUPPLIES) ×1 IMPLANT
PADDING CAST COTTON 4X4 STRL (CAST SUPPLIES) ×3
PADDING CAST COTTON 6X4 STRL (CAST SUPPLIES) ×3 IMPLANT
SET HNDPC FAN SPRY TIP SCT (DISPOSABLE) ×1 IMPLANT
SPONGE GAUZE 4X4 12PLY (GAUZE/BANDAGES/DRESSINGS) ×3 IMPLANT
STAPLER VISISTAT 35W (STAPLE) ×3 IMPLANT
SUCTION FRAZIER TIP 10 FR DISP (SUCTIONS) ×3 IMPLANT
SUT MNCRL AB 4-0 PS2 18 (SUTURE) IMPLANT
SUT VIC AB 0 CT1 27 (SUTURE) ×6
SUT VIC AB 0 CT1 27XBRD ANBCTR (SUTURE) ×2 IMPLANT
SUT VIC AB 2-0 CT1 27 (SUTURE) ×3
SUT VIC AB 2-0 CT1 TAPERPNT 27 (SUTURE) ×1 IMPLANT
SUT VIC AB 3-0 SH 8-18 (SUTURE) ×6 IMPLANT
SYR 30ML LL (SYRINGE) ×3 IMPLANT
TOWEL OR 17X24 6PK STRL BLUE (TOWEL DISPOSABLE) ×3 IMPLANT
TOWEL OR 17X26 10 PK STRL BLUE (TOWEL DISPOSABLE) ×3 IMPLANT
TRAY FOLEY CATH 16FRSI W/METER (SET/KITS/TRAYS/PACK) IMPLANT
WATER STERILE IRR 1000ML POUR (IV SOLUTION) ×6 IMPLANT

## 2014-03-16 NOTE — Transfer of Care (Signed)
Immediate Anesthesia Transfer of Care Note  Patient: [name]  Procedure(s) Performed: Procedure(s): LEFT TOTAL KNEE ARTHROPLASTY (Left)  Patient Location: PACU  Anesthesia Type:General  Level of Consciousness: awake, sedated and patient cooperative  Airway & Oxygen Therapy: Patient Spontanous Breathing and Patient connected to nasal cannula oxygen  Post-op Assessment: Report given to PACU RN and Post -op Vital signs reviewed and stable  Post vital signs: Reviewed and stable  Complications: No apparent anesthesia complications

## 2014-03-16 NOTE — Anesthesia Preprocedure Evaluation (Addendum)
Anesthesia Evaluation  Patient identified by MRN, date of birth, ID band Patient awake    Reviewed: Allergy & Precautions, H&P , NPO status , Patient's Chart, lab work & pertinent test results, reviewed documented beta blocker date and time   Airway Mallampati: II TM Distance: <3 FB Neck ROM: full  Mouth opening: Limited Mouth Opening  Dental  (+) Teeth Intact, Dental Advisory Given   Pulmonary shortness of breath, asthma , COPD COPD inhaler, Current Smoker,  Dailly nebs and inhalers for asthma, Still smoking 2 ppd cigs         Cardiovascular hypertension, Pt. on medications     Neuro/Psych  Headaches, Anxiety Depression Bipolar Disorder    GI/Hepatic PUD, GERD-  Controlled and Medicated,  Endo/Other    Renal/GU      Musculoskeletal   Abdominal   Peds  Hematology Elevated LFTs   Anesthesia Other Findings Teeth worn nearly in half; chip on one of back upper right  Reproductive/Obstetrics                          Anesthesia Physical Anesthesia Plan  ASA: III  Anesthesia Plan: General and Regional   Post-op Pain Management: MAC Combined w/ Regional for Post-op pain   Induction: Intravenous  Airway Management Planned:   Additional Equipment:   Intra-op Plan:   Post-operative Plan: Extubation in OR  Informed Consent: I have reviewed the patients History and Physical, chart, labs and discussed the procedure including the risks, benefits and alternatives for the proposed anesthesia with the patient or authorized representative who has indicated his/her understanding and acceptance.     Plan Discussed with: CRNA, Anesthesiologist and Surgeon  Anesthesia Plan Comments:         Anesthesia Quick Evaluation

## 2014-03-16 NOTE — Progress Notes (Signed)
Utilization review completed.  

## 2014-03-16 NOTE — Progress Notes (Signed)
The medicines that are not filled in , pt. Is unsure when she took last .

## 2014-03-16 NOTE — H&P (Signed)
PREOPERATIVE H&P  Chief Complaint: djd left knee  HPI: [name] is a 53 y.o. female who presents for preoperative history and physical with a diagnosis of djd left knee. Symptoms are rated as moderate to severe, and have been worsening.  This is significantly impairing activities of daily living.  She has elected for surgical management. Failed injections, NSAIDS, cane, XR with end stage DJD.  Past Medical History  Diagnosis Date  . Baker's cyst   . Pulmonary lesion 1/03    on RUL onn CT   . Hemorrhoids   . Zoster 2001  . Family history of diabetes mellitus   . Tobacco user   . Other abnormal Papanicolaou smear of cervix and cervical HPV(795.09)   . Herpes zoster   . Hemorrhoids   . Baker's cyst   . Pancreatitis   . HPV (human papilloma virus) infection   . HIV disease     take Prezista,Truvada,Norvir,and Isentress daily  . Bipolar disorder     With hx of psychotic features.  . Articular cartilage disorder of knee   . Bursitis of shoulder   . Rotator cuff arthropathy   . Hypertension     takes Clonidine and Verapamil daily  . Emphysema   . Asthma     Albuterol resue inhaler and QVAR daily  . Shortness of breath     with exertion and stress  . Headache(784.0)   . Dizziness   . Arthritis   . Joint pain   . Joint swelling   . Chronic back pain     buldging disc  . Bruises easily   . Gastric ulcer   . GERD (gastroesophageal reflux disease)     takes Nexium daily  . Chronic constipation   . Urinary frequency   . Urinary incontinence   . Nocturia   . History of blood transfusion at age 53  . Early cataracts, bilateral   . Anxiety   . Depression     takes Xanax daily and Depakote bid  . History of shingles   . 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 Tobacco abuse 10/26/2012  . HIV (human immunodeficiency virus infection)   . Chronic back pain   . Chronic knee pain   . Complication of anesthesia     AGGITATED  .  Pneumonia     hx of;last time 1 1/89yrs ago   Past Surgical History  Procedure Laterality Date  . Right breast lumpectomy    . Total abdominal hysterectomy w/ bilateral salpingoophorectomy  at age 33  . Breast surgery  as a teenager    rt breast biopsy  . Cholecystectomy  10+yrs ago  . Knee arthroscopy      bilateral  . Appendectomy      with hysterectomy  . Finger surgery      rt middle finger with pin in it  . Epidural injections    . Esophagogastroduodenoscopy    . Shoulder hemi-arthroplasty  05/06/2012    Procedure: SHOULDER HEMI-ARTHROPLASTY;  Surgeon: Johnny Bridge, MD;  Location: [facility];  Service: Orthopedics;  Laterality: Right;  . Abdominal hysterectomy    . Joint replacement    . Fracture surgery      Right finger third digit   History   Social History  . Marital Status: Widowed    Spouse Name: N/A    Number of Children: N/A  . Years of Education: N/A   Occupational History  . Unemployed  Social History Main Topics  . Smoking status: Current Every Day Smoker -- 1.00 packs/day for 32 years    Types: Cigarettes  . Smokeless tobacco: Never Used  . Alcohol Use: 0.5 oz/week    1 drink(s) per week     Comment: brandy occ  . Drug Use: Yes    Special: Marijuana     Comment: APRIL  . Sexual Activity: Yes    Partners: Male    Birth Control/ Protection: Surgical     Comment: pt. given condoms   Other Topics Concern  . Not on file   Social History Narrative  . No narrative on file   Family History  Problem Relation Age of Onset  . Diabetes      Family History  . Hypertension      Family history   . Hypertension Mother   . Diabetes Mother   . Diabetes Father   . Cancer Sister 64    x2 (liver)  . Diabetes Brother   . Heart failure Mother    Allergies  Allergen Reactions  . Aspirin Nausea And Vomiting  . Sulfonamide Derivatives Hives   Prior to Admission medications   Medication Sig Start Date End Date Taking? Authorizing Provider  albuterol  (PROVENTIL HFA;VENTOLIN HFA) 108 (90 BASE) MCG/ACT inhaler Inhale 2 puffs into the lungs every 4 (four) hours as needed for shortness of breath.  12/31/11  Yes Fayrene Helper, MD  albuterol (PROVENTIL) (2.5 MG/3ML) 0.083% nebulizer solution Take 2.5 mg by nebulization every 6 (six) hours as needed for wheezing or shortness of breath.   Yes Historical Provider, MD  ALPRAZolam Duanne Moron) 1 MG tablet Take 1 mg by mouth 2 (two) times daily as needed for anxiety.   Yes Historical Provider, MD  Azelastine HCl 0.15 % SOLN Place 1 spray into the nose daily as needed (for allergies).    Yes Historical Provider, MD  beclomethasone (QVAR) 40 MCG/ACT inhaler Inhale 2 puffs into the lungs 2 (two) times daily.   Yes Historical Provider, MD  cetirizine (ZYRTEC) 10 MG tablet Take 10 mg by mouth at bedtime.    Yes Historical Provider, MD  cloNIDine (CATAPRES) 0.2 MG tablet Take 0.1 mg by mouth at bedtime.   Yes Historical Provider, MD  cyanocobalamin (,VITAMIN B-12,) 1000 MCG/ML injection Inject 1,000 mcg into the muscle every 30 (thirty) days.   Yes Historical Provider, MD  Darunavir Ethanolate (PREZISTA) 800 MG tablet Take 800 mg by mouth daily with breakfast.   Yes Historical Provider, MD  diazepam (VALIUM) 5 MG tablet Take 5 mg by mouth every 12 (twelve) hours as needed for muscle spasms.   Yes Historical Provider, MD  diphenoxylate-atropine (LOMOTIL) 2.5-0.025 MG per tablet Take 1 tablet by mouth 4 (four) times daily as needed for diarrhea or loose stools.   Yes Historical Provider, MD  emtricitabine-tenofovir (TRUVADA) 200-300 MG per tablet Take 1 tablet by mouth daily.   Yes Historical Provider, MD  esomeprazole (NEXIUM) 40 MG capsule Take 40 mg by mouth daily before breakfast. 05/02/11  Yes Fayrene Helper, MD  methocarbamol (ROBAXIN) 750 MG tablet Take 750 mg by mouth every 6 (six) hours as needed for muscle spasms.   Yes Historical Provider, MD  naphazoline-pheniramine (NAPHCON-A) 0.025-0.3 % ophthalmic  solution Place 1 drop into both eyes 4 (four) times daily as needed for irritation.   Yes Historical Provider, MD  nicotine (NICODERM CQ - DOSED IN MG/24 HOURS) 21 mg/24hr patch Place 21 mg onto the skin daily.  Yes Historical Provider, MD  ondansetron (ZOFRAN) 4 MG tablet Take 4 mg by mouth every 8 (eight) hours as needed for nausea or vomiting.   Yes Historical Provider, MD  oxybutynin (DITROPAN-XL) 10 MG 24 hr tablet Take 10 mg by mouth 2 (two) times daily.   Yes Historical Provider, MD  oxyCODONE-acetaminophen (PERCOCET) 7.5-325 MG per tablet Take 1 tablet by mouth every 8 (eight) hours as needed for pain.  03/11/13  Yes Historical Provider, MD  potassium chloride (K-DUR) 10 MEQ tablet Take 10 mEq by mouth daily.   Yes Historical Provider, MD  predniSONE (DELTASONE) 20 MG tablet Take 20 mg by mouth daily with breakfast. 10/27/12  Yes Bobby Rumpf York, PA-C  promethazine (PHENERGAN) 25 MG tablet Take 25 mg by mouth every 6 (six) hours as needed for nausea.   Yes Historical Provider, MD  raltegravir (ISENTRESS) 400 MG tablet Take 400 mg by mouth 2 (two) times daily. 03/18/13  Yes Campbell Riches, MD  ritonavir (NORVIR) 100 MG capsule Take 100 mg by mouth daily with breakfast.   Yes Historical Provider, MD  verapamil (CALAN-SR) 240 MG CR tablet Take 240 mg by mouth daily.   Yes Historical Provider, MD  Vitamin D, Ergocalciferol, (DRISDOL) 50000 UNITS CAPS Take 50,000 Units by mouth every 7 (seven) days. Take on mondays   Yes Historical Provider, MD  zolpidem (AMBIEN) 5 MG tablet Take 5 mg by mouth at bedtime as needed for sleep.    Yes Historical Provider, MD  folic acid (FOLVITE) 1 MG tablet Take 1 mg by mouth daily.    Historical Provider, MD  Phenazopyridine HCl (AZO TABS PO) Take 2 tablets by mouth daily as needed (urinary symptoms).    Historical Provider, MD     Positive ROS: All other systems have been reviewed and were otherwise negative with the exception of those mentioned in the HPI and  as above.  Physical Exam: General: Alert, no acute distress Cardiovascular: No pedal edema Respiratory: No cyanosis, no use of accessory musculature GI: No organomegaly, abdomen is soft and non-tender Skin: No lesions in the area of chief complaint Neurologic: Sensation intact distally Psychiatric: Patient is competent for consent with normal mood and affect Lymphatic: No axillary or cervical lymphadenopathy  MUSCULOSKELETAL: left knee with painful crepitance rom 5-125 degrees.  Mod effusion.  Assessment: djd left knee with risk factors as above.  Plan: Plan for Procedure(s): LEFT TOTAL KNEE ARTHROPLASTY  The risks benefits and alternatives were discussed with the patient including but not limited to the risks of nonoperative treatment, versus surgical intervention including infection, bleeding, nerve injury,  blood clots, cardiopulmonary complications, morbidity, mortality, among others, and they were willing to proceed.   Johnny Bridge, MD Cell [phone]   03/16/2014 7:13 AM

## 2014-03-16 NOTE — Discharge Instructions (Signed)
Diet: As you were doing prior to hospitalization  ° °Shower:  May shower but keep the wounds dry, use an occlusive plastic wrap, NO SOAKING IN TUB.  If the bandage gets wet, change with a clean dry gauze. ° °Dressing:  You may change your dressing 3-5 days after surgery.  Then change the dressing daily with sterile gauze dressing.   ° °There are sticky tapes (steri-strips) on your wounds and all the stitches are absorbable.  Leave the steri-strips in place when changing your dressings, they will peel off with time, usually 2-3 weeks. ° °Activity:  Increase activity slowly as tolerated, but follow the weight bearing instructions below.  No lifting or driving for 6 weeks. ° °Weight Bearing:   As tolerated.   ° °To prevent constipation: you may use a stool softener such as - ° °Colace (over the counter) 100 mg by mouth twice a day  °Drink plenty of fluids (prune juice may be helpful) and high fiber foods °Miralax (over the counter) for constipation as needed.   ° °Itching:  If you experience itching with your medications, try taking only a single pain pill, or even half a pain pill at a time.  You may take up to 10 pain pills per day, and you can also use benadryl over the counter for itching or also to help with sleep.  ° °Precautions:  If you experience chest pain or shortness of breath - call 911 immediately for transfer to the hospital emergency department!! ° °If you develop a fever greater that 101 F, purulent drainage from wound, increased redness or drainage from wound, or calf pain -- Call the office at [phone]                                                °Follow- Up Appointment:  Please call for an appointment to be seen in 2 weeks [place] - [phone] ° ° ° ° ° °

## 2014-03-16 NOTE — Evaluation (Signed)
Agree with SPT.    Madilyne Tadlock, PT [phone]  

## 2014-03-16 NOTE — Progress Notes (Signed)
BP 155-160/ 112-128; NSR in the 80's. Dr Marcie Bal notified by CRNA. New order for IV Labetelol. Will cont to monitor closely.

## 2014-03-16 NOTE — Anesthesia Procedure Notes (Addendum)
Anesthesia Regional Block:  Femoral nerve block  Pre-Anesthetic Checklist: ,, timeout performed, Correct Patient, Correct Site, Correct Laterality, Correct Procedure,, site marked, risks and benefits discussed, Surgical consent,  Pre-op evaluation,  At surgeon's request and post-op pain management  Laterality: Left  Prep: chloraprep       Needles:  Injection technique: Single-shot  Needle Type: Echogenic Stimulator Needle     Needle Length: 9cm 9 cm Needle Gauge: 21 and 21 G    Additional Needles:  Procedures: nerve stimulator Femoral nerve block  Nerve Stimulator or Paresthesia:  Response: Quadriceps muscle contraction, 0.45 mA,   Additional Responses:   Narrative:  Start time: 03/16/2014 7:04 AM End time: 03/16/2014 7:17 AM Injection made incrementally with aspirations every 5 mL.  Performed by: Personally  Anesthesiologist: Dr Marcie Bal  Additional Notes: Functioning IV was confirmed and monitors were applied.  A 46mm 21ga Arrow echogenic stimulator needle was used. Sterile prep and drape,hand hygiene and sterile gloves were used.  Negative aspiration and negative test dose prior to incremental administration of local anesthetic. The patient tolerated the procedure well.     Procedure Name: Intubation Date/Time: 03/16/2014 7:52 AM Performed by: Terrill Mohr Pre-anesthesia Checklist: Patient identified, Emergency Drugs available, Suction available and Patient being monitored Patient Re-evaluated:Patient Re-evaluated prior to inductionOxygen Delivery Method: Circle system utilized Preoxygenation: Pre-oxygenation with 100% oxygen Intubation Type: IV induction Ventilation: Mask ventilation without difficulty LMA Size: 4.0 Laryngoscope Size: Mac and 3 Grade View: Grade I Tube size: 7.5 mm Number of attempts: 1 (OETT after attempting LMA; LMA removed immediately b/c  did not get good excursions, had leak.  Mouth terribly dry despite lubricant.  Dark red liq on  cuff.) Airway Equipment and Method: Stylet Placement Confirmation: ETT inserted through vocal cords under direct vision,  breath sounds checked- equal and bilateral and positive ETCO2 Secured at: 21 (cm at teeth) cm Tube secured with: Tape Dental Injury: Teeth and Oropharynx as per pre-operative assessment

## 2014-03-16 NOTE — Evaluation (Signed)
Physical Therapy Evaluation Patient Details Name: [name] MRN: [id number] DOB: 04-27-1961 Today's Date: 03/16/2014   History of Present Illness  53 y.o. Female S/p L TKA on 03/16/14, hx of HIV, bipolar disorder, chronic back pain, HTN, GERD  Clinical Impression  Pt limited during evaluation and treatment today due to pain and agitation.  Pt initially attempted to move bil. LE and push through UE to assist with bed mobility, but stopped helping quickly due to pain and required max-A to move LEs and power up into sitting.  Pt sat at EOB with bil. UE support and min-A at her back to resist posterior lean.  Pt reports that she wants to wait a few days before talking about her d/c plans.  She is unsure of what help she will have at home at this point and prefers to go to SNF for rehab before returning home.  Recommend continued PT services to improve mobility, activity tolerance, and strength.  Will continue to follow.   Follow Up Recommendations SNF    Equipment Recommendations  None recommended by PT (Pt has RW and 3in1 at home)    Recommendations for Other Services       Precautions / Restrictions Precautions Precautions: Knee;Fall Precaution Booklet Issued: Yes (comment) Precaution Comments: Pt not receptive to discussion of precautions today.  Pt agitated. Required Braces or Orthoses: Knee Immobilizer - Left Knee Immobilizer - Left: On when out of bed or walking;Discontinue once straight leg raise with < 10 degree lag Restrictions Weight Bearing Restrictions: Yes LLE Weight Bearing: Weight bearing as tolerated      Mobility  Bed Mobility Overal bed mobility: Needs Assistance Bed Mobility: Supine to Sit     Supine to sit: Max assist;HOB elevated     General bed mobility comments: Pt required max-A to move bil. LE over EOB and max-A to power up from supine.  Bed pad used for max-A to slide pt's hips to EOB.  Pt agitated and reluctant to assist with bed  mobility.  Transfers                 General transfer comment: Pt declined OOB attempts.  Ambulation/Gait                Stairs            Wheelchair Mobility    Modified Rankin (Stroke Patients Only)       Balance Overall balance assessment: Needs assistance Sitting-balance support: Bilateral upper extremity supported;Feet unsupported Sitting balance-Leahy Scale: Poor Sitting balance - Comments: Pt declined scooting to EOB to allow feet to touch the floor.  Pt maintained bil. UE support on bed for sitting balance with min-A  for posterior lean.                                     Pertinent Vitals/Pain Pt reports 10/10 pain, nurse notified.    Home Living Family/patient expects to be discharged to:: Inpatient rehab Living Arrangements: Spouse/significant other (fiancee) Available Help at Discharge: Friend(s);Other (Comment) (Fiancee works, pt is unsure of other help at this point) Type of Home: House Home Access: Stairs to enter Entrance Stairs-Rails: None Entrance Stairs-Number of Steps: 1 Home Layout: One level Home Equipment: Environmental consultant - 2 wheels;Bedside commode;Shower seat;Grab bars - toilet Additional Comments: Pt agitated and evasive when responding to questions about home environment.  She believes that it is too early to ask  questions, she wants to wait a few days to get her thoughts together.    Prior Function Level of Independence: Independent               Hand Dominance        Extremity/Trunk Assessment   Upper Extremity Assessment: Defer to OT evaluation           Lower Extremity Assessment: LLE deficits/detail   LLE Deficits / Details: increased weakness and pain s/p L TKA     Communication   Communication: No difficulties  Cognition Arousal/Alertness: Lethargic Behavior During Therapy: Agitated Overall Cognitive Status: No family/caregiver present to determine baseline cognitive functioning                       General Comments      Exercises Total Joint Exercises Ankle Circles/Pumps: AROM;Both;5 reps;Supine      Assessment/Plan    PT Assessment Patient needs continued PT services  PT Diagnosis Abnormality of gait;Acute pain   PT Problem List Decreased strength;Decreased range of motion;Decreased activity tolerance;Decreased balance;Decreased mobility;Decreased knowledge of use of DME;Decreased knowledge of precautions;Decreased safety awareness;Pain  PT Treatment Interventions DME instruction;Gait training;Stair training;Functional mobility training;Therapeutic activities;Therapeutic exercise;Balance training;Patient/family education   PT Goals (Current goals can be found in the Care Plan section) Acute Rehab PT Goals Patient Stated Goal: Be able to walk before returning home PT Goal Formulation: With patient Time For Goal Achievement: 03/26/14 Potential to Achieve Goals: Good    Frequency 7X/week   Barriers to discharge Other (comment) (Pt unsure of help available to her if d/c to home) Pt agitated, states that she wants to go to CIR for a few days before returning home.    Co-evaluation               End of Session Equipment Utilized During Treatment: Left knee immobilizer Activity Tolerance: Patient limited by pain;Treatment limited secondary to agitation Patient left: in bed;with call bell/phone within reach;with nursing/sitter in room Nurse Communication: Mobility status;Patient requests pain meds         Time: 1343-1419 PT Time Calculation (min): 36 min   Charges:   PT Evaluation $Initial PT Evaluation Tier I: 1 Procedure PT Treatments $Therapeutic Activity: 23-37 mins   PT G Codes:          [name], SPT 03/16/2014, 3:12 PM

## 2014-03-16 NOTE — Anesthesia Postprocedure Evaluation (Signed)
Anesthesia Post Note  Patient: [name]  Procedure(s) Performed: Procedure(s) (LRB): LEFT TOTAL KNEE ARTHROPLASTY (Left)  Anesthesia type: General  Patient location: PACU  Post pain: Pain level controlled and Adequate analgesia  Post assessment: Post-op Vital signs reviewed, Patient's Cardiovascular Status Stable, Respiratory Function Stable, Patent Airway and Pain level controlled  Last Vitals:  Filed Vitals:   03/16/14 1043  BP:   Pulse: 80  Temp:   Resp: 16    Post vital signs: Reviewed and stable  Level of consciousness: awake, alert  and oriented  Complications: No apparent anesthesia complications

## 2014-03-16 NOTE — Op Note (Signed)
DATE OF SURGERY:  03/16/2014 TIME: 9:57 AM  PATIENT NAME:  [name]   AGE: 53 y.o.    PRE-OPERATIVE DIAGNOSIS:  djd left knee  POST-OPERATIVE DIAGNOSIS:  Same  PROCEDURE:  Procedure(s): LEFT TOTAL KNEE ARTHROPLASTY   SURGEON:  Johnny Bridge, MD   ASSISTANT:  Joya Gaskins, OPA-C, present and scrubbed throughout the case, critical for assistance with exposure, retraction, instrumentation, and closure.   OPERATIVE IMPLANTS: Depuy PFC Sigma, Posterior Stabilized.  Femur size 2.5, Tibia size 2.5, Patella size 35 3-peg oval button, with a 10 mm polyethylene insert.   PREOPERATIVE INDICATIONS:  [name] is a 53 y.o. 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   The risks, benefits, and alternatives were discussed at length including but not limited to the risks of infection, bleeding, nerve injury, stiffness, blood clots, the need for revision surgery, cardiopulmonary complications, among others, and they were willing to proceed.   OPERATIVE DESCRIPTION:  The patient was brought to the operative room and placed in a supine position.  General anesthesia was administered.  IV antibiotics were given in the form of 2 g of Ancef.  The lower extremity was prepped and draped in the usual sterile fashion.  Time out was performed.  The leg was elevated and exsanguinated and the tourniquet was inflated.  Anterior quadriceps tendon splitting approach was performed.  The patella was everted and osteophytes were removed.  The anterior horn of the medial and lateral meniscus was removed.   The distal femur was opened with the drill and the intramedullary distal femoral cutting jig was utilized, set at 5 degrees  resecting 10 mm off the distal femur.  Care was taken to protect the collateral ligaments. The lateral condyle was hypoplastic, and did have grade 4 chondral loss both on the tibia and femur, and the distal femoral resection was minimal on the lateral side.  Then the extramedullary tibial cutting jig was utilized making the appropriate cut using the anterior tibial crest as a reference building in appropriate posterior slope.   She had a fairly flat proximal tibia, and I placed the slope at approximately 16. Care was taken during the cut to protect the medial and collateral ligaments.  The proximal tibia was removed along with the posterior horns of the menisci.  The PCL was sacrificed.    The extensor gap was measured and was approximately 38mm.    The distal femoral sizing jig was applied, taking care to avoid notching.  Then the 4-in-1 cutting jig was applied and the anterior and posterior femur was cut, along with the chamfer cuts.  All posterior osteophytes were removed.  The flexion gap was then measured and was symmetric with the extension gap, although it was slightly tight, but the spacer block was able to provide satisfactory fit..  I completed the distal femoral preparation using the appropriate jig to prepare the box.  The patella was then measured, and cut with the saw.   it measured 24 mm before the resection, and 14 mm afterwards.   The proximal tibia was sized and prepared accordingly with the reamer and the punch, and then all components were trialed with the 10mm poly insert.   the tibial component was aligned with the medial border of the tibial tubercle,  And I suspect that slight increased external rotation may have made placement of the polyethylene slightly easier, but overall I was satisfied with the correction of her valgus deformity, and alignment and tracking of the patella.  The knee was found to have excellent balance and full motion.    The above named components were  then cemented into place and all excess cement was removed.  The real polyethylene implant was placed.  The knee was easily taken through a range of motion and the patella tracked well and the knee irrigated copiously and the parapatellar and subcutaneous tissue closed with vicryl, and monocryl with steri strips for the skin.  The wounds were injected with marcaine, and dressed with sterile gauze and the tourniquet released and the patient was awakened and returned to the PACU in stable and satisfactory condition.  There were no complications.  Total tourniquet time was 99 minutes.

## 2014-03-16 NOTE — Plan of Care (Signed)
Problem: Consults Goal: Diagnosis- Total Joint Replacement Primary Total Knee Left     

## 2014-03-17 LAB — CBC
HCT: 32.3 % — ABNORMAL LOW (ref 36.0–46.0)
Hemoglobin: 10.7 g/dL — ABNORMAL LOW (ref 12.0–15.0)
MCH: 30.9 pg (ref 26.0–34.0)
MCHC: 33.1 g/dL (ref 30.0–36.0)
MCV: 93.4 fL (ref 78.0–100.0)
Platelets: 169 10*3/uL (ref 150–400)
RBC: 3.46 MIL/uL — ABNORMAL LOW (ref 3.87–5.11)
RDW: 15.8 % — AB (ref 11.5–15.5)
WBC: 7.5 10*3/uL (ref 4.0–10.5)

## 2014-03-17 LAB — BASIC METABOLIC PANEL
BUN: 7 mg/dL (ref 6–23)
CO2: 20 mEq/L (ref 19–32)
Calcium: 8.3 mg/dL — ABNORMAL LOW (ref 8.4–10.5)
Chloride: 97 mEq/L (ref 96–112)
Creatinine, Ser: 0.54 mg/dL (ref 0.50–1.10)
GFR calc non Af Amer: >90 mL/min (ref >90)
Glucose, Bld: 137 mg/dL — ABNORMAL HIGH (ref 70–99)
Potassium: 4.6 mEq/L (ref 3.7–5.3)
SODIUM: 138 meq/L (ref 137–147)

## 2014-03-17 NOTE — Care Management Note (Signed)
CARE MANAGEMENT NOTE 03/17/2014  Patient:  [name], [name]   Account Number:  [id number]  Date Initiated:  03/17/2014  Documentation initiated by:  Ricki Miller  Subjective/Objective Assessment:   53 yr old female s/p left total knee arthroplasty.     Action/Plan:   Patient is for shorterm rehab at [facility]. Social worker notified.   Anticipated DC Date:  03/18/2014   Anticipated DC Plan:  SKILLED NURSING FACILITY  In-house referral  Clinical Social Worker      DC Planning Services  CM consult      [facility] Choice  NA   Choice offered to / List presented to:             Status of service:  Completed, signed off Medicare Important Message given?  NA - LOS <3 / Initial given by admissions (If response is "NO", the following Medicare IM given date fields will be blank) Date Medicare IM given:   Date Additional Medicare IM given:    Discharge Disposition:  [place]  Per UR Regulation:  Reviewed for med. necessity/level of care/duration of stay

## 2014-03-17 NOTE — Progress Notes (Signed)
Chaplain responded to spiritual consult. Pt stated she is concerned about finding 24 hr help for her recovery at home. She said she does not want to to a facility because she has "bills to take care of." Pt expressed various other worries about the trustworthiness of her home aid, paying her bills, and getting her car's title. Chaplain provided empathic listening and prayer for these stressors. Pt expressed thanks for visit.

## 2014-03-17 NOTE — Evaluation (Signed)
Occupational Therapy Evaluation Patient Details Name: [name] MRN: [id number] DOB: 07/24/1961 Today's Date: 03/17/2014    History of Present Illness 53 y.o. Female S/p L TKA on 03/16/14, hx of HIV, bipolar disorder, chronic back pain, HTN, GERD   Clinical Impression   Pt admitted with the above diagnoses and presents with below problem list. Pt will benefit from continued acute OT to address the below listed deficits and maximize independence with basic ADLs prior to d/c. Limited performance this session due to 9.5/10 pain with pt declining to come EOB. Cues needed for technique with bed mobility. Pt agitated this session and had difficulty attending to conversation. Educated on safety, bed mobility and positioning. Recommending SNF. Pt stated she is not ready to decide on d/c plan and is concerned about cost.     Follow Up Recommendations  SNF    Equipment Recommendations  None recommended by OT    Recommendations for Other Services       Precautions / Restrictions Precautions Precautions: Knee;Fall Precaution Booklet Issued: Yes (comment) Required Braces or Orthoses: Knee Immobilizer - Left Knee Immobilizer - Left: Discontinue once straight leg raise with < 10 degree lag Restrictions Weight Bearing Restrictions: Yes LLE Weight Bearing: Weight bearing as tolerated      Mobility Bed Mobility Overal bed mobility: Needs Assistance Bed Mobility: Rolling Rolling: Supervision   Supine to sit: Min assist     General bed mobility comments: scooted up in bed with cues for technique and lowering HOB. Pt laying on left side upon arrival, educated pt on laying on back with cushion under L foot.  Transfers Overall transfer level: Needs assistance Equipment used: Rolling walker (2 wheeled) Transfers: Sit to/from Stand Sit to Stand: Mod assist;Min assist         General transfer comment: Pt declined to come EOB due to 9.5/10 pain and having "just seen PT"    Balance  Overall balance assessment: Needs assistance Sitting-balance support: Single extremity supported Sitting balance-Leahy Scale: Poor     Standing balance support: Bilateral upper extremity supported Standing balance-Leahy Scale: Poor                              ADL Overall ADL's : Needs assistance/impaired Eating/Feeding: Set up;Sitting   Grooming: Set up;Sitting   Upper Body Bathing: Min guard;Sitting   Lower Body Bathing: With adaptive equipment;Sit to/from stand;Minimal assistance   Upper Body Dressing : Set up;Sitting   Lower Body Dressing: Minimal assistance;With adaptive equipment;Sit to/from stand   Toilet Transfer: Min guard;Ambulation;BSC;RW   Toileting- Clothing Manipulation and Hygiene: Minimal assistance;Sit to/from stand   Tub/ Shower Transfer: Min guard;Ambulation;3 in 1;Rolling walker   Functional mobility during ADLs: Min guard;Rolling walker General ADL Comments: Pt limited this session by pain 9.5/10. Pt upset that home aide had not arrived to bring her toiletries from home, provided pt with basic toiletries. Pt agitated  during session and not receptive to AE discussion this session. Focused on safety, some bed mobility, and recommendations for d/c planning.      Vision                     Perception     Praxis      Pertinent Vitals/Pain 9.5/10. Nursing notified.     Hand Dominance     Extremity/Trunk Assessment Upper Extremity Assessment Upper Extremity Assessment: Generalized weakness   Lower Extremity Assessment Lower Extremity Assessment: Defer to PT  evaluation       Communication Communication Communication: No difficulties   Cognition Arousal/Alertness: Awake/alert Behavior During Therapy: Agitated;Flat affect Overall Cognitive Status: No family/caregiver present to determine baseline cognitive functioning                     General Comments       Exercises      Shoulder Instructions      Home  Living Family/patient expects to be discharged to:: Private residence Living Arrangements: Spouse/significant other;Other (Comment) (has an aide) Available Help at Discharge: Friend(s);Other (Comment) Type of Home: House Home Access: Stairs to enter Entrance Stairs-Number of Steps: 1 Entrance Stairs-Rails: None Home Layout: One level         Biochemist, clinical: Standard     Home Equipment: Environmental consultant - 2 wheels;Bedside commode;Shower seat;Grab bars - toilet   Additional Comments: agitated throughout session difficult to assess availablity of care post d/c      Prior Functioning/Environment Level of Independence: Independent             OT Diagnosis: Generalized weakness;Cognitive deficits;Acute pain   OT Problem List: Decreased cognition;Decreased strength;Decreased range of motion;Decreased activity tolerance;Impaired balance (sitting and/or standing);Decreased safety awareness;Decreased knowledge of use of DME or AE;Decreased knowledge of precautions;Pain   OT Treatment/Interventions: Self-care/ADL training;Therapeutic exercise;DME and/or AE instruction;Therapeutic activities;Patient/family education;Balance training    OT Goals(Current goals can be found in the care plan section) Acute Rehab OT Goals Patient Stated Goal: "get back to feeling like a normal person again" OT Goal Formulation: With patient Time For Goal Achievement: 03/24/14 Potential to Achieve Goals: Good ADL Goals Pt Will Perform Lower Body Bathing: with supervision;with adaptive equipment;sit to/from stand Pt Will Perform Lower Body Dressing: with supervision;with adaptive equipment;sit to/from stand Pt Will Transfer to Toilet: with supervision;ambulating (3n1 over toilet) Pt Will Perform Toileting - Clothing Manipulation and hygiene: with supervision;sit to/from stand Pt Will Perform Tub/Shower Transfer: with supervision;ambulating;3 in 1;rolling walker Additional ADL Goal #1: Pt will complete bed mobility  at supervision level while observing knee precautions to prepare for OOB ADLs.   OT Frequency: Min 2X/week   Barriers to D/C: Decreased caregiver support  pt presents with probable baseline cognitive deficits especially with safety awareness, problem-solving and attention.       Co-evaluation              End of Session Nurse Communication: Patient requests pain meds  Activity Tolerance: Patient limited by pain Patient left: in bed;with call bell/phone within reach   Time: 1349-1412 OT Time Calculation (min): 23 min Charges:  OT General Charges $OT Visit: 1 Procedure OT Evaluation $Initial OT Evaluation Tier I: 1 Procedure G-Codes:    Hortencia Pilar 06-Apr-2014, 2:35 PM

## 2014-03-17 NOTE — Progress Notes (Signed)
Patient became irate and demanding this afternoon that she be allowed to go outside to see visitors.  RN spoke with patient stating that it was not something that was typically allowed, and that the MD would have to be made aware.  Spoke with Dr. Mardelle Matte regarding this matter, he was agreeable to let her go outside, and if she wanted to smoke that would be ok, but if she wanted to use any type of IV/recreational drugs that she would need to submit to urine drug test upon return as this has been an issue in the past with prior patients.  Patient understood and agreed to such.  Patient downstairs to go outside with visitor as well as Metallurgist, student nurse reported patient smoked 2 cigarettes while outside.  Patient returned to room, nursing will continue to monitor.

## 2014-03-17 NOTE — Progress Notes (Signed)
Physical Therapy Treatment Patient Details Name: [name] MRN: [id number] DOB: Mar 10, 1961 Today's Date: 03/17/2014    History of Present Illness 53 y.o. Female S/p L TKA on 03/16/14, hx of HIV, bipolar disorder, chronic back pain, HTN, GERD    PT Comments    Pt able to attempt amb today with max encouragement to do so.  Discussed with pt D/C plans and pt resistant initially and goes back and forth on home vs SNF.  Pt mentions an aide at home, but states she's not sure she can trust her aide and that her aide has her things and won't bring them to her.  Pt also stating her MD told her she could stay here for at least 4 days to figure out what she wants to do at D/C.  At this time SNF is safest option for pt and most beneficial.    Follow Up Recommendations  SNF     Equipment Recommendations  None recommended by PT    Recommendations for Other Services       Precautions / Restrictions Precautions Precautions: Knee;Fall Precaution Booklet Issued: Yes (comment) Required Braces or Orthoses: Knee Immobilizer - Left Knee Immobilizer - Left: Discontinue once straight leg raise with < 10 degree lag Restrictions Weight Bearing Restrictions: Yes LLE Weight Bearing: Weight bearing as tolerated    Mobility  Bed Mobility Overal bed mobility: Needs Assistance Bed Mobility: Supine to Sit     Supine to sit: Min assist     General bed mobility comments: cues for sequencing and A for bringing L LE OOB.    Transfers Overall transfer level: Needs assistance Equipment used: Rolling walker (2 wheeled) Transfers: Sit to/from Stand Sit to Stand: Mod assist;Min assist         General transfer comment: pt initially Eleanor to stand from bed telling PT to "pull" her up, but when standing from recliner pt able to complete with MinA.    Ambulation/Gait Ambulation/Gait assistance: Min assist Ambulation Distance (Feet): 4 Feet Assistive device: Rolling walker (2 wheeled) Gait  Pattern/deviations: Step-to pattern;Decreased step length - right;Decreased stance time - left;Trunk flexed     General Gait Details: Max cueing for safe use of RW, upright posture, and encouragement.  pt initially refusing to attempt amb, but eventually agreeable.     Stairs            Wheelchair Mobility    Modified Rankin (Stroke Patients Only)       Balance Overall balance assessment: Needs assistance Sitting-balance support: Single extremity supported Sitting balance-Leahy Scale: Poor     Standing balance support: Bilateral upper extremity supported Standing balance-Leahy Scale: Poor                      Cognition Arousal/Alertness: Awake/alert Behavior During Therapy: Flat affect Overall Cognitive Status: No family/caregiver present to determine baseline cognitive functioning                      Exercises Total Joint Exercises Long Arc QuadSinclair Ship;Left;5 reps Knee Flexion: AAROM;Left;5 reps Goniometric ROM: ~ 12 - 85    General Comments        Pertinent Vitals/Pain Premedicated.      Home Living                      Prior Function            PT Goals (current goals can now be found in the  care plan section) Acute Rehab PT Goals Patient Stated Goal: Be able to walk before returning home Time For Goal Achievement: 03/26/14 Potential to Achieve Goals: Good Progress towards PT goals: Progressing toward goals    Frequency  7X/week    PT Plan Current plan remains appropriate    Co-evaluation             End of Session Equipment Utilized During Treatment: Gait belt;Left knee immobilizer Activity Tolerance: Patient limited by pain;Patient limited by fatigue Patient left: in chair;with call bell/phone within reach     Time: 1057-1126 PT Time Calculation (min): 29 min  Charges:  $Gait Training: 8-22 mins $Therapeutic Exercise: 8-22 mins                    G CodesCatarina Hartshorn,  [place] [phone] 03/17/2014, 2:12 PM

## 2014-03-17 NOTE — Progress Notes (Signed)
Patient ID: [name], female   DOB: [birth year], 53 y.o.   MRN: [id number]     Subjective:  Patient reports pain as mild to moderate.  Patient states that she has been able to get up and go to the rest room on her own.  She also states that she is having to go very frequently.  Objective:   VITALS:   Filed Vitals:   03/16/14 2133 03/17/14 0000 03/17/14 0400 03/17/14 0504  BP: 112/81   119/84  Pulse: 97   90  Temp: 98.1 F (36.7 C)   97.4 F (36.3 C)  TempSrc: Oral   Oral  Resp: 16 17 18 16   Height:      Weight:      SpO2: 99% 98% 9% 92%    ABD soft Sensation intact distally Dorsiflexion/Plantar flexion intact Incision: dressing C/D/I and no drainage No ROM of the knee checked  Foot, ankle, and toes normal  Lab Results  Component Value Date   WBC 7.5 03/17/2014   HGB 10.7* 03/17/2014   HCT 32.3* 03/17/2014   MCV 93.4 03/17/2014   PLT 169 03/17/2014     Assessment/Plan: 1 Day Post-Op   Principal Problem:   Osteoarthritis of left knee Active Problems:   Knee osteoarthritis   Advance diet Up with therapy WBAT Dry dressing PRN Ok to saline lock IV per nursing request Plan for DC tomorrow or Friday    Joya Gaskins 03/17/2014, 7:44 AM  Discussed and agree with above.  Marchia Bond, MD Cell [phone]

## 2014-03-17 NOTE — Plan of Care (Signed)
Problem: Consults Goal: Diagnosis- Total Joint Replacement Primary Total Knee Left     

## 2014-03-18 ENCOUNTER — Encounter (HOSPITAL_COMMUNITY): Payer: Self-pay | Admitting: Orthopedic Surgery

## 2014-03-18 LAB — CBC
HEMATOCRIT: 31.3 % — AB (ref 36.0–46.0)
HEMOGLOBIN: 10 g/dL — AB (ref 12.0–15.0)
MCH: 30.5 pg (ref 26.0–34.0)
MCHC: 31.9 g/dL (ref 30.0–36.0)
MCV: 95.4 fL (ref 78.0–100.0)
Platelets: 185 10*3/uL (ref 150–400)
RBC: 3.28 MIL/uL — ABNORMAL LOW (ref 3.87–5.11)
RDW: 16 % — AB (ref 11.5–15.5)
WBC: 10.6 10*3/uL — ABNORMAL HIGH (ref 4.0–10.5)

## 2014-03-18 NOTE — Progress Notes (Addendum)
Patient has bed offers if she is agreeable to a ST- SNF. CSW stressed the importance of refraining from smoking at the facility. Patient reported, "I can smoke when I want to".  This, unfortunately would be a big barrier to the patient remaining at a facility.  All facilities have a non-smoking policy.   Rhea Pink, MSW, [place]

## 2014-03-18 NOTE — Progress Notes (Signed)
Patient continues to be experiencing visual and auditory hallucinations. Disoriented x 2, oriented to self only.

## 2014-03-18 NOTE — Clinical Social Work Psychosocial (Signed)
Clinical Social Work Department  BRIEF PSYCHOSOCIAL ASSESSMENT  Patient: [name]  Account Number: [id number]  Admit date: 03/16/14 Clinical Social Worker Rhea Pink, MSW Date/Time: 03/18/2014 10:30 AM Referred by: Physician Date Referred: [facility] Referred for   SNF Placement   Other Referral:  Interview type: Patient at bedsdie Other interview type: PSYCHOSOCIAL DATA  Living Status:at a private residence with her boyfriend Admitted from facility:  Level of care:  Primary support name: Chauncey Mann Primary support relationship to patient:  Degree of support available:  Fair  CURRENT CONCERNS  Current Concerns   Post-Acute Placement  And smoking. Patient asked several times if she could go out to smoke.   Other Concerns:  SOCIAL WORK ASSESSMENT / PLAN  CSW met with pt at bedside to offer support and discuss SNF placement. Patient had tangential thoughts, flight of ideas with pressured speech. Patient appeared to be slightly manic. Patient was not able to express clearly  her thoughts about SNF placement. At one point she reported that she wants to go home and smoke but then said her boyfriend travels a bit and cannot help her at home. CSW was able to get patient to agree to being faxed out for placement. On several occasions, pt reported, Dr. Mardelle Matte said I could stay here four more days. CSW encouraged patient to think about her discharge plan. CSW will follow up with patient.  re: PT recommendation for SNF.   Pt lives with her boyfriend  CSW explained placement process and answered questions.   Pt reports no  preference at this time   CSW completed FL2 and initiated SNF search.     Assessment/plan status: Information/Referral to [employer]  Other assessment/ plan:  Information/referral to community resources:  SNF     PATIENT'S/FAMILY'S RESPONSE TO PLAN OF CARE:  It is unclear if patient Pis agreeable to ST SNF. CSW will follow up with patient in the am.    Rhea Pink, MSW, [place]

## 2014-03-18 NOTE — Progress Notes (Signed)
Physical Therapy Treatment Patient Details Name: CORRYN MADEWELL MRN: [id number] DOB: 1961-06-20 Today's Date: 03/18/2014    History of Present Illness 53 y.o. Female S/p L TKA on 03/16/14, hx of HIV, bipolar disorder, chronic back pain, HTN, GERD    PT Comments    Patient continues to be self-limting and concerned with family situation and going out to smoke. Patient unable to confirm safe DC plan and assistance so will continue to recommend SNF and patient in agreement  Follow Up Recommendations  SNF     Equipment Recommendations  None recommended by PT    Recommendations for Other Services       Precautions / Restrictions Precautions Precautions: Knee;Fall Precaution Comments: Pt not receptive to discussion of precautions today.  Pt agitated. Required Braces or Orthoses: Knee Immobilizer - Left Knee Immobilizer - Left: Discontinue once straight leg raise with < 10 degree lag Restrictions Weight Bearing Restrictions: Yes LLE Weight Bearing: Weight bearing as tolerated    Mobility  Bed Mobility                  Transfers Overall transfer level: Needs assistance Equipment used: Rolling walker (2 wheeled)   Sit to Stand: Min assist         General transfer comment: Min A for control and balance. Cues for best technique  Ambulation/Gait Ambulation/Gait assistance: Min assist Ambulation Distance (Feet): 15 Feet Assistive device: Rolling walker (2 wheeled) Gait Pattern/deviations: Step-to pattern;Decreased step length - right;Decreased step length - left     General Gait Details: Max cueing for safe use of RW, upright posture, and encouragement.  Patient attempting to sit in chair halfway through ambulation but encouraged and agreed to make it to recliner   Stairs            Wheelchair Mobility    Modified Rankin (Stroke Patients Only)       Balance                                    Cognition Arousal/Alertness:  Awake/alert Behavior During Therapy: Agitated;Flat affect Overall Cognitive Status: No family/caregiver present to determine baseline cognitive functioning                      Exercises Total Joint Exercises Quad Sets: AROM;Left;10 reps Heel Slides: AAROM;Left;10 reps Hip ABduction/ADduction: AAROM;Left;10 reps Straight Leg Raises: AAROM;Left;10 reps    General Comments        Pertinent Vitals/Pain Patient did not rate    Home Living                      Prior Function            PT Goals (current goals can now be found in the care plan section) Progress towards PT goals: Progressing toward goals    Frequency  7X/week    PT Plan Current plan remains appropriate    Co-evaluation             End of Session Equipment Utilized During Treatment: Gait belt;Left knee immobilizer Activity Tolerance: Patient limited by pain;Patient limited by fatigue Patient left: in chair;with call bell/phone within reach;with chair alarm set     Time: 6962-9528 PT Time Calculation (min): 19 min  Charges:  $Gait Training: 8-22 mins  G Codes:      Tonia Brooms Jaleigha Deane 03/18/2014, 12:05 PM 03/18/2014 [place] PTA [phone] pager [phone] office

## 2014-03-18 NOTE — Progress Notes (Signed)
Patient found smoking in room, smoking products confiscated from patient secondary to safety issue of fire and hospital policy of non smoking facility. ( Lighter and pack of [place] cigarettes placed in patients chart drawer outside of room.

## 2014-03-18 NOTE — Clinical Social Work Placement (Addendum)
Clinical Social Work Department  CLINICAL SOCIAL WORK PLACEMENT NOTE  Patient:Lajoya L Mullinax Account [id number]  Admit date: 5.26.15  Clinical Social Worker: Rhea Pink LCSWA Date/time: 03/18/2014 10:30 AM  Clinical Social Work is seeking post-discharge placement for this patient at the following level of care: SKILLED NURSING (*CSW will update this form in Epic as items are completed)  03/18/2014 Patient/family provided with [place] Department of Clinical Social Work's list of facilities offering this level of care within the geographic area requested by the patient (or if unable, by the patient's family).  03/18/2014 Patient/family informed of their freedom to choose among providers that offer the needed level of care, that participate in Medicare, Medicaid or managed care program needed by the patient, have an available bed and are willing to accept the patient.  03/18/2014 Patient/family informed of [facility]' ownership interest in [facility], as well as of the fact that they are under no obligation to receive care at this facility.  PASARR submitted to EDS on 03/18/2014 PASARR number received from EDS on 03/22/2014 FL2 transmitted to all facilities in geographic area requested by pt/family on 03/18/2014  FL2 transmitted to all facilities within larger geographic area on  Patient informed that his/her managed care company has contracts with or will negotiate with certain facilities, including the following:  Patient/family informed of bed offers received: 03/19/14 Patient chooses bed at  [facility] Physician recommends and patient chooses bed at  Patient to be transferred to on 03/22/2014 Patient to be transferred to facility by private vehicle The following physician request were entered in Epic:  Additional Comments:

## 2014-03-18 NOTE — Progress Notes (Signed)
     Subjective:  Patient reports pain as mild.  Difficulty with PT, she is not sure if she has the resources to be cared for at home.  Objective:   VITALS:   Filed Vitals:   03/17/14 1500 03/17/14 2110 03/17/14 2116 03/18/14 0522  BP: 122/84 136/90  128/92  Pulse: 102  98 97  Temp: 98.1 F (36.7 C)  97.5 F (36.4 C) 97.4 F (36.3 C)  TempSrc:      Resp: 18  18 18   Height:      Weight:      SpO2: 95%  100% 100%    Neurologically intact Dorsiflexion/Plantar flexion intact Incision: dressing C/D/I and no drainage   Lab Results  Component Value Date   WBC 10.6* 03/18/2014   HGB 10.0* 03/18/2014   HCT 31.3* 03/18/2014   MCV 95.4 03/18/2014   PLT 185 03/18/2014     Assessment/Plan: 2 Days Post-Op   Principal Problem:   Osteoarthritis of left knee Active Problems:   Knee osteoarthritis   Advance diet Up with therapy Discharge home with home health planned, but May need SNF depending on PT and home resources.   Johnny Bridge 03/18/2014, 7:47 AM

## 2014-03-18 NOTE — Progress Notes (Signed)
Patient demanding to be taken downstairs to smoke. Patient asked to call family to come an visit so they may take her off campus to smoke. Patient sitting in chair at bedside with cigarettes and lighter in lap. Patient reminded that hospital policy forbids smoking anywhere.

## 2014-03-19 ENCOUNTER — Other Ambulatory Visit: Payer: Self-pay | Admitting: Licensed Clinical Social Worker

## 2014-03-19 LAB — CBC
HCT: 29.5 % — ABNORMAL LOW (ref 36.0–46.0)
Hemoglobin: 9.7 g/dL — ABNORMAL LOW (ref 12.0–15.0)
MCH: 31 pg (ref 26.0–34.0)
MCHC: 32.9 g/dL (ref 30.0–36.0)
MCV: 94.2 fL (ref 78.0–100.0)
Platelets: 163 10*3/uL (ref 150–400)
RBC: 3.13 MIL/uL — AB (ref 3.87–5.11)
RDW: 16 % — ABNORMAL HIGH (ref 11.5–15.5)
WBC: 9.5 10*3/uL (ref 4.0–10.5)

## 2014-03-19 MED ORDER — LORAZEPAM 1 MG PO TABS: 1.0000 mg | ORAL_TABLET | Freq: Four times a day (QID) | ORAL | Status: DC | PRN

## 2014-03-19 MED ORDER — LORAZEPAM 2 MG/ML IJ SOLN: 1.0000 mg | Freq: Four times a day (QID) | INTRAMUSCULAR | Status: DC | PRN

## 2014-03-19 MED ORDER — THIAMINE HCL 100 MG/ML IJ SOLN
100.0000 mg | Freq: Every day | INTRAMUSCULAR | Status: DC
  Filled 2014-03-19: qty 1

## 2014-03-19 MED ORDER — ADULT MULTIVITAMIN W/MINERALS CH
1.0000 | ORAL_TABLET | Freq: Every day | ORAL | Status: DC
  Administered 2014-03-19 – 2014-03-22 (×4): 1 via ORAL
  Filled 2014-03-19 (×4): qty 1

## 2014-03-19 MED ORDER — LORAZEPAM 2 MG/ML IJ SOLN: 0.0000 mg | Freq: Two times a day (BID) | INTRAMUSCULAR | Status: DC

## 2014-03-19 MED ORDER — VITAMIN B-1 100 MG PO TABS
100.0000 mg | ORAL_TABLET | Freq: Every day | ORAL | Status: DC
  Administered 2014-03-19 – 2014-03-22 (×4): 100 mg via ORAL
  Filled 2014-03-19 (×4): qty 1

## 2014-03-19 MED ORDER — LORAZEPAM 1 MG PO TABS
0.0000 mg | ORAL_TABLET | Freq: Two times a day (BID) | ORAL | Status: DC
  Administered 2014-03-22: 0 mg via ORAL

## 2014-03-19 MED ORDER — FOLIC ACID 1 MG PO TABS
1.0000 mg | ORAL_TABLET | Freq: Every day | ORAL | Status: DC
  Administered 2014-03-20 – 2014-03-22 (×3): 1 mg via ORAL
  Filled 2014-03-19 (×4): qty 1

## 2014-03-19 MED ORDER — LORAZEPAM 2 MG/ML IJ SOLN: 0.0000 mg | Freq: Four times a day (QID) | INTRAMUSCULAR | Status: DC

## 2014-03-19 MED ORDER — LORAZEPAM 1 MG PO TABS
0.0000 mg | ORAL_TABLET | Freq: Four times a day (QID) | ORAL | Status: AC
  Administered 2014-03-19: 2 mg via ORAL
  Filled 2014-03-19: qty 2

## 2014-03-19 MED ORDER — RITONAVIR 100 MG PO CAPS: 100.0000 mg | ORAL_CAPSULE | Freq: Every day | ORAL | Status: DC

## 2014-03-19 MED ORDER — HYDROCODONE-ACETAMINOPHEN 5-325 MG PO TABS
1.0000 | ORAL_TABLET | ORAL | Status: DC | PRN
  Administered 2014-03-19 – 2014-03-20 (×4): 2 via ORAL
  Administered 2014-03-21: 1 via ORAL
  Administered 2014-03-21 – 2014-03-22 (×3): 2 via ORAL
  Filled 2014-03-19 (×8): qty 2

## 2014-03-19 MED ORDER — EMTRICITABINE-TENOFOVIR DF 200-300 MG PO TABS: 1.0000 | ORAL_TABLET | Freq: Every day | ORAL | Status: DC

## 2014-03-19 NOTE — Progress Notes (Signed)
Physical Therapy Treatment Patient Details Name: [name] MRN: [id number] DOB: 02/10/1961 Today's Date: 03/19/2014    History of Present Illness 53 y.o. Female S/p L TKA on 03/16/14, hx of HIV, bipolar disorder, chronic back pain, HTN, GERD    PT Comments    Pt needs max encouragement for participation and maintaining attention to task.  Pt eventually agreeable to OOB when encouraged to stand up and look for her e-cigarette.  Pt needs consistent cueing for safety and staying on task.  Attempted to talk to pt about D/C and pt becomes upset and starts talking about her sister's boyfriend, having a cigarette, and wanting something to drink.  Difficult to have D/C discussion with pt unable to maintain topic.  Still feel SNF is safest D/C option.  Will continue to follow.    Follow Up Recommendations  SNF     Equipment Recommendations  None recommended by PT    Recommendations for Other Services       Precautions / Restrictions Precautions Precautions: Knee;Fall Required Braces or Orthoses: Knee Immobilizer - Left Knee Immobilizer - Left: Discontinue once straight leg raise with < 10 degree lag Restrictions Weight Bearing Restrictions: Yes LLE Weight Bearing: Weight bearing as tolerated    Mobility  Bed Mobility Overal bed mobility: Needs Assistance Bed Mobility: Supine to Sit;Sit to Supine     Supine to sit: Min assist;HOB elevated Sit to supine: Min assist;HOB elevated   General bed mobility comments: pt needs A with L LE in/out of bed.  pt easily distracted and requires re-directing to task.    Transfers Overall transfer level: Needs assistance Equipment used: Rolling walker (2 wheeled) Transfers: Sit to/from Stand Sit to Stand: Min assist         General transfer comment: cues for use of UEs and getting closer to bed prior to sitting.  pt needs cues for safety with bathroom transfer.    Ambulation/Gait Ambulation/Gait assistance: Min guard Ambulation  Distance (Feet): 15 Feet (x2) Assistive device: Rolling walker (2 wheeled) Gait Pattern/deviations: Step-to pattern;Decreased step length - right;Decreased stance time - left;Trunk flexed     General Gait Details: Max cueing for safe use of RW, upright posture and attending to task.  pt very distractable during mobility.     Stairs            Wheelchair Mobility    Modified Rankin (Stroke Patients Only)       Balance Overall balance assessment: Needs assistance         Standing balance support: Single extremity supported Standing balance-Leahy Scale: Poor                      Cognition Arousal/Alertness: Awake/alert Behavior During Therapy: Restless;Flat affect Overall Cognitive Status: No family/caregiver present to determine baseline cognitive functioning                      Exercises      General Comments        Pertinent Vitals/Pain When asked about pain pt begins talking about going outside for a smoke.      Home Living                      Prior Function            PT Goals (current goals can now be found in the care plan section) Acute Rehab PT Goals Time For Goal Achievement: 03/26/14 Potential to Achieve  Goals: Good Progress towards PT goals: Progressing toward goals    Frequency  7X/week    PT Plan Current plan remains appropriate    Co-evaluation             End of Session Equipment Utilized During Treatment: Gait belt;Left knee immobilizer Activity Tolerance:  (Limited by attention and participation.  ) Patient left: in bed;with call bell/phone within reach;with bed alarm set     Time: 7588-3254 PT Time Calculation (min): 24 min  Charges:  $Gait Training: 8-22 mins $Therapeutic Activity: 8-22 mins                    G CodesCatarina Hartshorn, [place] 03/19/2014, 12:18 PM

## 2014-03-19 NOTE — Progress Notes (Signed)
Patient exhibiting unsafe behaviors all day, standing up without walker, getting up out of bed without assistance, calling out into the hallway.  Patient confused intermittently through out day as to where she was and what she was doing in the hospital.  Patient still with e-cigarette in room, refusing to cease its use.  Patient stated that she "would rather fall on the floor than not be able to smoke."  Patient repeatedly asking for real cigarettes, even though she was reminded multiple times smoking not allowed in hospital.  Patient back and forth as to whether she was going to go to a SNF or go home.  Spoke with patient's significant other, and he said he did not feel as she was safe enough to go home, as she did not have enough support to help, and that he would talk to her again about going to SNF as opposed to home.  Therapy also recommending SNF.  Spoke with Dr. Mardelle Matte regarding patient not being able to be d/c today due to inability to obtain PASSR number, and that patient was insisting she would be going home when her ride came, even though no one had expressed that they would be picking her up.  He said that if she was refusing SNF, and was unsafe to go home, she would have to sign out AMA.  Nursing staff was made aware that patient drinks and smokes on a daily basis, as was reported to the social worker per the significant other.  Spoke with Carlynn Spry, PA, whom spoke with Dr. Mardelle Matte regarding this situation, new order for the CIWA protocol.  Oxycodone d/c, and patient started on Vicodin PRN for pain.  Ativan administered per CIWA protocol, nursing will continue to monitor.

## 2014-03-19 NOTE — Progress Notes (Signed)
CSW spoke with patient's significant other about patient's disposition. Patient will likely be here through the weekend since PASSR system down. CSW expressed to patient's significant other that he will need to bring her HIV medications to [facility]. Patient's significant other stated that this would not be a problem. Patient's significant other reported that patient is currently in a manic state and may be withdrawing from ETOH and cigarettes. Patient's significant other also reported that the patient was being managed by a psychiatrist for her bipolar and was doing well. However, she stopped seeing the psychiatrist and her behaviors returned. CSW made RN aware of this information. CSW will continue to follow.  Rhea Pink, MSW, [place]

## 2014-03-19 NOTE — Progress Notes (Signed)
Patient ID: [name], female   DOB: 1961-05-26, 53 y.o.   MRN: [id number]     Subjective:  Patient reports pain as mild.  Patient sleeping but did wake to follow direction and answer questions.  Denies CP or SOB  Objective:   VITALS:   Filed Vitals:   03/18/14 1130 03/18/14 1300 03/18/14 2007 03/19/14 0528  BP:  132/94 131/107 133/94  Pulse:  96 93 121  Temp:  98.1 F (36.7 C) 97.6 F (36.4 C) 98 F (36.7 C)  TempSrc:   Oral Oral  Resp:  18 18 16   Height:      Weight:      SpO2: 98% 98% 99% 93%    ABD soft Sensation intact distally Dorsiflexion/Plantar flexion intact Incision: dressing C/D/I and no drainage Ankle and foot function good   Lab Results  Component Value Date   WBC 10.6* 03/18/2014   HGB 10.0* 03/18/2014   HCT 31.3* 03/18/2014   MCV 95.4 03/18/2014   PLT 185 03/18/2014     Assessment/Plan: 3 Days Post-Op   Principal Problem:   Osteoarthritis of left knee Active Problems:   Knee osteoarthritis   Advance diet Up with therapy WBAT Dry dressing PRN Plan for SNF if patient wants if not DC home   Joya Gaskins 03/19/2014, 7:31 AM  Discussed and agree with above.  Marchia Bond, MD Cell [phone]

## 2014-03-19 NOTE — Progress Notes (Signed)
Patient constantly asking and looking for her cigarettes. Staff looked all over room so that patient could see that her cigarettes were not in the room. Patient needed to be reminded that she could not smoke in the room. Patient still insisted that she needed her cigarettes. Patient has vapor cigarette in room. Seems to calm the patient for awhile but she gets restless and starts asking for her cigarettes again. Replaced dressing on patient's knee due to her picking and removing the ace wrap and the webril dressing. Placed mepilex dressing on patient's knee. Will continue to monitor.

## 2014-03-19 NOTE — Progress Notes (Signed)
CSW awaiting PASSR. [facility] System is down. CSW attempted to contact PA to inform him of this.  Rhea Pink, MSW, [place]

## 2014-03-19 NOTE — Discharge Summary (Addendum)
Physician Discharge Summary  Patient ID: [name] MRN: [id number] DOB/AGE: 01/24/1961 53 y.o.  Admit date: 03/16/2014 Discharge date: 03/22/2014  Admission Diagnoses:  Osteoarthritis of left knee  Discharge Diagnoses:  Principal Problem:   Osteoarthritis of left knee Active Problems:   Knee osteoarthritis   Alcohol withdrawal delirium   Past Medical History  Diagnosis Date  . Baker's cyst   . Pulmonary lesion 1/03    on RUL onn CT   . Hemorrhoids   . Zoster 2001  . Family history of diabetes mellitus   . Tobacco user   . Other abnormal Papanicolaou smear of cervix and cervical HPV(795.09)   . Herpes zoster   . Hemorrhoids   . Baker's cyst   . Pancreatitis   . HIV disease     take Prezista,Truvada,Norvir,and Isentress daily  . Bipolar disorder     With hx of psychotic features.  . Articular cartilage disorder of knee   . Bursitis of shoulder   . Rotator cuff arthropathy   . Hypertension     takes Clonidine and Verapamil daily  . Emphysema   . Asthma     Albuterol resue inhaler and QVAR daily  . Shortness of breath     with exertion and stress  . Dizziness   . Joint pain   . Joint swelling   . Chronic back pain     buldging disc  . Bruises easily   . Gastric ulcer   . GERD (gastroesophageal reflux disease)     takes Nexium daily  . Chronic constipation   . Urinary frequency   . Urinary incontinence   . Nocturia   . History of blood transfusion at age 87  . Early cataracts, bilateral   . Anxiety   . Depression     takes Xanax daily and Depakote bid  . History of shingles   . 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 Tobacco abuse 10/26/2012  . Chronic back pain   . Chronic knee pain   . Complication of anesthesia     AGGITATED  . Uterine cancer     "had some kind of cancer in my teens; had to have TAH to remove it"  . Headache(784.0)     "often here lately; weather changing"  . Pneumonia     "several  times"  . Arthritis     "all over" (03/16/2014)  . Osteoarthritis of left knee 03/16/2014  . Alcohol withdrawal delirium 03/22/2014    Surgeries: Procedure(s): LEFT TOTAL KNEE ARTHROPLASTY on 03/16/2014   Consultants (if any):    Discharged Condition: Improved  Hospital Course: [name] is an 53 y.o. female who was admitted 03/16/2014 with a diagnosis of Osteoarthritis of left knee and went to the operating room on 03/16/2014 and underwent the above named procedures.    She was given perioperative antibiotics:      Anti-infectives   Start     Dose/Rate Route Frequency Ordered Stop   03/17/14 1000  emtricitabine-tenofovir (TRUVADA) 200-300 MG per tablet 1 tablet     1 tablet Oral Daily 03/16/14 1226     03/17/14 0800  ritonavir (NORVIR) capsule 100 mg     100 mg Oral Daily with breakfast 03/16/14 1226     03/17/14 0800  Darunavir Ethanolate (PREZISTA) tablet 800 mg     800 mg Oral Daily with breakfast 03/16/14 1226     03/16/14 1300  raltegravir (ISENTRESS) tablet 400 mg  400 mg Oral 2 times daily 03/16/14 1226     03/16/14 1230  ceFAZolin (ANCEF) IVPB 2 g/50 mL premix     2 g 100 mL/hr over 30 Minutes Intravenous Every 6 hours 03/16/14 1226 03/16/14 1842   03/16/14 0600  ceFAZolin (ANCEF) IVPB 2 g/50 mL premix  Status:  Discontinued     2 g 100 mL/hr over 30 Minutes Intravenous On call to O.R. 03/16/14 0600 03/16/14 1145   03/16/14 0600  ceFAZolin (ANCEF) IVPB 2 g/50 mL premix     2 g 100 mL/hr over 30 Minutes Intravenous On call to O.R. 03/15/14 1254 03/16/14 0755    .  She was given sequential compression devices, early ambulation, and lovenox for DVT prophylaxis.  She benefited maximally from the hospital stay and there were no complications.  She did have significant behavioral problems during her hospital stay, as well as episodes of delirium, which resolved with CIWA protocol and Ativan administration.  She has effectively demanded to be discharged home, and does  not want to go to skilled nursing facility.  Recent vital signs:  Filed Vitals:   03/22/14 0440  BP: 157/96  Pulse: 96  Temp: 97.6 F (36.4 C)  Resp: 18    Recent laboratory studies:  Lab Results  Component Value Date   HGB 9.7* 03/19/2014   HGB 10.0* 03/18/2014   HGB 10.7* 03/17/2014   Lab Results  Component Value Date   WBC 9.5 03/19/2014   PLT 163 03/19/2014   Lab Results  Component Value Date   INR 0.89 03/08/2014   Lab Results  Component Value Date   NA 138 03/17/2014   K 4.6 03/17/2014   CL 97 03/17/2014   CO2 20 03/17/2014   BUN 7 03/17/2014   CREATININE 0.54 03/17/2014   GLUCOSE 137* 03/17/2014    Discharge Medications:     Medication List    STOP taking these medications       oxyCODONE-acetaminophen 7.5-325 MG per tablet  Commonly known as:  PERCOCET  Replaced by:  oxyCODONE-acetaminophen 10-325 MG per tablet      TAKE these medications       albuterol (2.5 MG/3ML) 0.083% nebulizer solution  Commonly known as:  PROVENTIL  Take 2.5 mg by nebulization every 6 (six) hours as needed for wheezing or shortness of breath.     albuterol 108 (90 BASE) MCG/ACT inhaler  Commonly known as:  PROVENTIL HFA;VENTOLIN HFA  Inhale 2 puffs into the lungs every 4 (four) hours as needed for shortness of breath.     ALPRAZolam 1 MG tablet  Commonly known as:  XANAX  Take 1 mg by mouth 2 (two) times daily as needed for anxiety.     Azelastine HCl 0.15 % Soln  Place 1 spray into the nose daily as needed (for allergies).     AZO TABS PO  Take 2 tablets by mouth daily as needed (urinary symptoms).     beclomethasone 40 MCG/ACT inhaler  Commonly known as:  QVAR  Inhale 2 puffs into the lungs 2 (two) times daily.     bisacodyl 5 MG EC tablet  Commonly known as:  DULCOLAX  Take 1 tablet (5 mg total) by mouth daily as needed for moderate constipation.     cetirizine 10 MG tablet  Commonly known as:  ZYRTEC  Take 10 mg by mouth at bedtime.     cloNIDine 0.2 MG tablet   Commonly known as:  CATAPRES  Take 0.1 mg by  mouth at bedtime.     cyanocobalamin 1000 MCG/ML injection  Commonly known as:  (VITAMIN B-12)  Inject 1,000 mcg into the muscle every 30 (thirty) days.     diazepam 5 MG tablet  Commonly known as:  VALIUM  Take 5 mg by mouth every 12 (twelve) hours as needed for muscle spasms.     diphenoxylate-atropine 2.5-0.025 MG per tablet  Commonly known as:  LOMOTIL  Take 1 tablet by mouth 4 (four) times daily as needed for diarrhea or loose stools.     enoxaparin 30 MG/0.3ML injection  Commonly known as:  LOVENOX  Inject 0.3 mLs (30 mg total) into the skin every 12 (twelve) hours.     esomeprazole 40 MG capsule  Commonly known as:  NEXIUM  Take 40 mg by mouth daily before breakfast.     folic acid 1 MG tablet  Commonly known as:  FOLVITE  Take 1 mg by mouth daily.     HYDROmorphone 2 MG tablet  Commonly known as:  DILAUDID  Take 1 tablet (2 mg total) by mouth every 4 (four) hours as needed for severe pain.     methocarbamol 750 MG tablet  Commonly known as:  ROBAXIN  Take 1 tablet (750 mg total) by mouth every 6 (six) hours as needed for muscle spasms.     naphazoline-pheniramine 0.025-0.3 % ophthalmic solution  Commonly known as:  NAPHCON-A  Place 1 drop into both eyes 4 (four) times daily as needed for irritation.     nicotine 21 mg/24hr patch  Commonly known as:  NICODERM CQ - dosed in mg/24 hours  Place 21 mg onto the skin daily.     ondansetron 4 MG tablet  Commonly known as:  ZOFRAN  Take 4 mg by mouth every 8 (eight) hours as needed for nausea or vomiting.     oxybutynin 10 MG 24 hr tablet  Commonly known as:  DITROPAN-XL  Take 10 mg by mouth 2 (two) times daily.     oxyCODONE-acetaminophen 10-325 MG per tablet  Commonly known as:  PERCOCET  Take 1-2 tablets by mouth every 6 (six) hours as needed for pain. MAXIMUM TOTAL ACETAMINOPHEN DOSE IS 4000 MG PER DAY     potassium chloride 10 MEQ tablet  Commonly known as:   K-DUR  Take 10 mEq by mouth daily.     predniSONE 20 MG tablet  Commonly known as:  DELTASONE  Take 20 mg by mouth daily with breakfast.     PREZISTA 800 MG tablet  Generic drug:  Darunavir Ethanolate  Take 800 mg by mouth daily with breakfast.     promethazine 25 MG tablet  Commonly known as:  PHENERGAN  Take 1 tablet (25 mg total) by mouth every 6 (six) hours as needed for nausea or vomiting.     promethazine 25 MG tablet  Commonly known as:  PHENERGAN  Take 25 mg by mouth every 6 (six) hours as needed for nausea.     raltegravir 400 MG tablet  Commonly known as:  ISENTRESS  Take 400 mg by mouth 2 (two) times daily.     sennosides-docusate sodium 8.6-50 MG tablet  Commonly known as:  SENOKOT-S  Take 2 tablets by mouth daily.     verapamil 240 MG CR tablet  Commonly known as:  CALAN-SR  Take 240 mg by mouth daily.     Vitamin D (Ergocalciferol) 50000 UNITS Caps capsule  Commonly known as:  DRISDOL  Take 50,000 Units by mouth  every 7 (seven) days. Take on mondays     zolpidem 5 MG tablet  Commonly known as:  AMBIEN  Take 5 mg by mouth at bedtime as needed for sleep.      ASK your doctor about these medications       emtricitabine-tenofovir 200-300 MG per tablet  Commonly known as:  TRUVADA  Take 1 tablet by mouth daily.  Ask about: Which instructions should I use?     ritonavir 100 MG capsule  Commonly known as:  NORVIR  Take 1 capsule (100 mg total) by mouth daily with breakfast.  Ask about: Which instructions should I use?        Diagnostic Studies: Dg Chest 2 View  03/08/2014   CLINICAL DATA:  Preoperative evaluation for knee replacement  EXAM: CHEST  2 VIEW  COMPARISON:  07/16/2013  FINDINGS: The cardiac shadow is stable. Postoperative changes are noted in the right shoulder. Mild bibasilar scarring is seen. No focal confluent infiltrate is noted.  IMPRESSION: No acute abnormality seen.   Electronically Signed   By: Inez Catalina M.D.   On: 03/08/2014 10:44    Dg Knee Left Port  03/16/2014   CLINICAL DATA:  Left knee arthroplasty  EXAM: PORTABLE LEFT KNEE - 1-2 VIEW  COMPARISON:  09/24/2010  FINDINGS: Two views show total knee arthroplasty. Components are grossly well positioned. No radiographically detectable complication. Fluid and air are present in the joint. No drain is visible.  IMPRESSION: Total knee arthroplasty.  No unexpected finding.   Electronically Signed   By: Nelson Chimes M.D.   On: 03/16/2014 10:53    Disposition: SNF  Discharge Instructions   Weight bearing as tolerated    Complete by:  As directed            Follow-up Information   Follow up with Johnny Bridge, MD. Schedule an appointment as soon as possible for a visit in 2 weeks.   Specialty:  Orthopedic Surgery   Contact information:   [address] [place] [address] [phone]        Signed: Johnny Bridge 03/22/2014, 7:41 AM

## 2014-03-20 NOTE — Progress Notes (Signed)
Pt continues to try to get out of bed by herself. Instructed several times to call for assistance. Bed alarm and room monitor on.

## 2014-03-20 NOTE — Progress Notes (Signed)
Physical Therapy Treatment Patient Details Name: [name] MRN: [id number] DOB: 05/30/1961 Today's Date: 03/20/2014    History of Present Illness 53 y.o. Female S/p L TKA on 03/16/14, hx of HIV, bipolar disorder, chronic back pain, HTN, GERD    PT Comments    Patient more appropriate conversation and speech this afternoon. Patient agreeable to sit up in recliner. Did not want to go out to room. Patient did have electronic cigarette but was not perseverating on the use of it or lack of being able to smoke this session.   Follow Up Recommendations  SNF     Equipment Recommendations  None recommended by PT    Recommendations for Other Services       Precautions / Restrictions Precautions Precautions: Knee;Fall Required Braces or Orthoses: Knee Immobilizer - Left Knee Immobilizer - Left: Discontinue once straight leg raise with < 10 degree lag Restrictions LLE Weight Bearing: Weight bearing as tolerated    Mobility  Bed Mobility Overal bed mobility: Modified Independent                Transfers Overall transfer level: Needs assistance Equipment used: Rolling walker (2 wheeled) Transfers: Sit to/from Stand Sit to Stand: Min guard         General transfer comment: Cues for safe hand placement  Ambulation/Gait Ambulation/Gait assistance: Min guard Ambulation Distance (Feet): 15 Feet Assistive device: Rolling walker (2 wheeled)       General Gait Details: patient more focused with ambulation this session but only agreeable to walk to door than back to recliner. Did not want to walk out in hallway. Ambulated without KI, no buckling noted   Stairs            Wheelchair Mobility    Modified Rankin (Stroke Patients Only)       Balance                                    Cognition Arousal/Alertness: Awake/alert Behavior During Therapy: WFL for tasks assessed/performed Overall Cognitive Status: Within Functional Limits for tasks  assessed                      Exercises Total Joint Exercises Quad Sets: AROM;Left;10 reps Heel Slides: AAROM;Left;10 reps Hip ABduction/ADduction: AAROM;Left;10 reps Straight Leg Raises: AAROM;Left;10 reps Long Arc Quad: AAROM;Left;10 reps    General Comments        Pertinent Vitals/Pain no apparent distress     Home Living                      Prior Function            PT Goals (current goals can now be found in the care plan section) Progress towards PT goals: Progressing toward goals    Frequency  7X/week    PT Plan Current plan remains appropriate    Co-evaluation             End of Session Equipment Utilized During Treatment: Gait belt   Patient left: in chair;with call bell/phone within reach     Time: 1320-1336 PT Time Calculation (min): 16 min  Charges:  $Gait Training: 8-22 mins                    G Codes:      Tonia Brooms [name] 03/20/2014, 2:38 PM 03/20/2014 Tonia Brooms [name] PTA  [id number] pager [phone] office

## 2014-03-20 NOTE — Progress Notes (Signed)
SPORTS MEDICINE AND JOINT REPLACEMENT  Lara Mulch, MD   Carlynn Spry, PA-C [place], [place], [place]  [address]                             [phone]   PROGRESS NOTE  Subjective:  negative for Chest Pain  negative for Shortness of Breath  negative for Nausea/Vomiting   negative for Calf Pain  negative for Bowel Movement   Tolerating Diet: yes         Patient reports pain as 4 on 0-10 scale.    Objective: Vital signs in last 24 hours:   Patient Vitals for the past 24 hrs:  BP Temp Temp src Pulse Resp SpO2  03/20/14 0841 - - - - - 94 %  03/20/14 0545 135/80 mmHg 98.6 F (37 C) Oral 90 18 95 %  03/20/14 0400 - - - - 20 95 %  03/20/14 0000 - - - - 18 94 %  03/19/14 2100 - - - 95 20 92 %  03/19/14 2044 124/78 mmHg 99 F (37.2 C) Oral 88 18 97 %  03/19/14 2000 - - - - 18 96 %  03/19/14 1850 115/68 mmHg - - 85 - -  03/19/14 1104 134/96 mmHg - - - - -  03/19/14 1004 - - - - - 95 %    @flow {1959:LAST@   Intake/Output from previous day:   05/29 0701 - 05/30 0700 In: 840 [P.O.:840] Out: -    Intake/Output this shift:       Intake/Output     05/29 0701 - 05/30 0700 05/30 0701 - 05/31 0700   P.O. 840    Total Intake(mL/kg) 840 (10.8)    Net +840          Urine Occurrence 4 x       LABORATORY DATA:  Recent Labs  03/17/14 0559 03/18/14 0600 03/19/14 0855  WBC 7.5 10.6* 9.5  HGB 10.7* 10.0* 9.7*  HCT 32.3* 31.3* 29.5*  PLT 169 185 163    Recent Labs  03/17/14 0559  NA 138  K 4.6  CL 97  CO2 20  BUN 7  CREATININE 0.54  GLUCOSE 137*  CALCIUM 8.3*   Lab Results  Component Value Date   INR 0.89 03/08/2014   INR 0.91 05/05/2012   INR 0.97 11/17/2009    Examination:  General appearance: alert, cooperative and no distress Extremities: Homans sign is negative, no sign of DVT  Wound Exam: clean, dry, intact   Drainage:  None: wound tissue dry  Motor Exam: EHL and FHL Intact  Sensory Exam: Deep Peroneal normal   Assessment:     4 Days Post-Op  Procedure(s) (LRB): LEFT TOTAL KNEE ARTHROPLASTY (Left)  ADDITIONAL DIAGNOSIS:  Principal Problem:   Osteoarthritis of left knee Active Problems:   Knee osteoarthritis  Acute Blood Loss Anemia   Plan: Physical Therapy as ordered Non Weight Bearing (NWB)    DISCHARGE PLAN: Skilled Nursing Facility/Rehab social worker aware           Carlynn Spry 03/20/2014, 9:02 AM

## 2014-03-21 MED ORDER — HYDROMORPHONE HCL 2 MG PO TABS
2.0000 mg | ORAL_TABLET | ORAL | Status: DC | PRN
  Administered 2014-03-21 (×2): 2 mg via ORAL
  Filled 2014-03-21 (×2): qty 1

## 2014-03-21 NOTE — Progress Notes (Signed)
SPORTS MEDICINE AND JOINT REPLACEMENT  Lara Mulch, MD   Carlynn Spry, PA-C [place], [place], [place]  [address]                             [phone]   PROGRESS NOTE  Subjective:  negative for Chest Pain  negative for Shortness of Breath  negative for Nausea/Vomiting   negative for Calf Pain  negative for Bowel Movement   Tolerating Diet: yes         Patient reports pain as 8 on 0-10 scale.    Objective: Vital signs in last 24 hours:   Patient Vitals for the past 24 hrs:  BP Temp Temp src Pulse Resp SpO2  03/21/14 0602 166/102 mmHg 98.1 F (36.7 C) Oral 80 18 98 %  03/21/14 0332 - - - - 18 99 %  03/21/14 0000 - - - - 18 99 %  03/20/14 2200 - - - 89 18 99 %  03/20/14 2033 157/99 mmHg 98.2 F (36.8 C) Oral 77 16 97 %  03/20/14 2000 - - - - 16 97 %  03/20/14 1605 - - - - 16 97 %  03/20/14 1300 151/88 mmHg 98.2 F (36.8 C) - 91 18 96 %  03/20/14 1200 - - - - 18 94 %    @flow {1959:LAST@   Intake/Output from previous day:   05/30 0701 - 05/31 0700 In: 1080 [P.O.:1080] Out: -    Intake/Output this shift:   05/31 0701 - 05/31 1900 In: 240 [P.O.:240] Out: -    Intake/Output     05/30 0701 - 05/31 0700 05/31 0701 - 06/01 0700   P.O. 1080 240   Total Intake(mL/kg) 1080 (13.8) 240 (3.1)   Net +1080 +240        Urine Occurrence 9 x    Stool Occurrence 1 x       LABORATORY DATA:  Recent Labs  03/17/14 0559 03/18/14 0600 03/19/14 0855  WBC 7.5 10.6* 9.5  HGB 10.7* 10.0* 9.7*  HCT 32.3* 31.3* 29.5*  PLT 169 185 163    Recent Labs  03/17/14 0559  NA 138  K 4.6  CL 97  CO2 20  BUN 7  CREATININE 0.54  GLUCOSE 137*  CALCIUM 8.3*   Lab Results  Component Value Date   INR 0.89 03/08/2014   INR 0.91 05/05/2012   INR 0.97 11/17/2009    Examination:  General appearance: alert, cooperative and no distress Extremities: Homans sign is negative, no sign of DVT  Wound Exam: clean, dry, intact   Drainage:  None: wound tissue  dry  Motor Exam: EHL and FHL Intact  Sensory Exam: Deep Peroneal normal   Assessment:    5 Days Post-Op  Procedure(s) (LRB): LEFT TOTAL KNEE ARTHROPLASTY (Left)  ADDITIONAL DIAGNOSIS:  Principal Problem:   Osteoarthritis of left knee Active Problems:   Knee osteoarthritis  Acute Blood Loss Anemia   Plan: Physical Therapy as ordered Weight Bearing as Tolerated (WBAT)    DISCHARGE PLAN: Home vs SNF  Will try some dilaudid PO for pain control prn    Pt adament about going home and not snf, states she has a nurse and family help,  Will let Dr. Mardelle Matte and Erlene Quan make that call in the morning         Carlynn Spry 03/21/2014, 10:17 AM

## 2014-03-21 NOTE — Progress Notes (Signed)
Physical Therapy Treatment Patient Details Name: [name] MRN: [id number] DOB: 1961/02/13 Today's Date: 03/21/2014    History of Present Illness 53 y.o. Female S/p L TKA on 03/16/14, hx of HIV, bipolar disorder, chronic back pain, HTN, GERD    PT Comments    Pt progressing towards physical therapy goals. Ambulates up to 25 feet with min guard for safety and frequent cues for technique. She agreed with PT that she would greatly benefit from further rehab at [facility] before returning home. Patient will continue to benefit from skilled physical therapy services to further improve independence with functional mobility. Will continue to follow until d/c.   Follow Up Recommendations  SNF     Equipment Recommendations  None recommended by PT    Recommendations for Other Services       Precautions / Restrictions Precautions Precautions: Knee;Fall Required Braces or Orthoses: Knee Immobilizer - Left Knee Immobilizer - Left: Discontinue once straight leg raise with < 10 degree lag Restrictions Weight Bearing Restrictions: Yes LLE Weight Bearing: Weight bearing as tolerated    Mobility  Bed Mobility Overal bed mobility: Needs Assistance Bed Mobility: Supine to Sit     Supine to sit: HOB elevated;Min assist     General bed mobility comments: Min assist with HOB elevated for supine<>sit to assist with trunk into sitting position. Use of rail. able to control LLE off of bed.  Transfers Overall transfer level: Needs assistance Equipment used: Rolling walker (2 wheeled) Transfers: Sit to/from Stand Sit to Stand: Min assist         General transfer comment: Min assist for power up boost. Cues for hand placement and technique. Pt much improved on second attempt with cues to bring trunk forward. Requires extra time and relies heavily on RW, requiring block to steady RW.  Ambulation/Gait Ambulation/Gait assistance: Min guard Ambulation Distance (Feet): 25 Feet Assistive  device: Rolling walker (2 wheeled) Gait Pattern/deviations: Step-to pattern;Decreased step length - right;Decreased stance time - left;Antalgic;Trunk flexed Gait velocity: decreased   General Gait Details: Cues for sequencing and close guard for safety. Frequent verbal cues for upright posture and walker placement. Pt required one standing rest break and leaned heavily on end of bed after PT instructed not to due so for safety reasons.    Stairs            Wheelchair Mobility    Modified Rankin (Stroke Patients Only)       Balance                                    Cognition Arousal/Alertness: Awake/alert Behavior During Therapy: WFL for tasks assessed/performed Overall Cognitive Status: Within Functional Limits for tasks assessed                      Exercises Total Joint Exercises Ankle Circles/Pumps: AROM;Both;10 reps;Supine Quad Sets: AROM;Left;10 reps;Supine Gluteal Sets: Strengthening;Both;10 reps;Seated    General Comments General comments (skin integrity, edema, etc.): Reviewed knee precautions and safe positioning. Educated on exercises as pt states she does not remember any that she was previously taught. discussed d/c planning and she agrees SNF would be best before d/c to home.      Pertinent Vitals/Pain Pt reports pain as "high" no numerical value given Patient repositioned in chair for comfort.     Home Living  Prior Function            PT Goals (current goals can now be found in the care plan section) Acute Rehab PT Goals PT Goal Formulation: With patient Time For Goal Achievement: 03/26/14 Potential to Achieve Goals: Good Progress towards PT goals: Progressing toward goals    Frequency  7X/week    PT Plan Current plan remains appropriate    Co-evaluation             End of Session Equipment Utilized During Treatment: Gait belt;Left knee immobilizer Activity Tolerance: Patient  tolerated treatment well (Limited by attention and participation.  ) Patient left: with call bell/phone within reach;in chair;with chair alarm set     Time: 2353-6144 PT Time Calculation (min): 25 min  Charges:  $Gait Training: 8-22 mins $Therapeutic Activity: 8-22 mins                    G CodesCamille Bal [place], [place]  Ellouise Newer 03/21/2014, 4:21 PM

## 2014-03-22 ENCOUNTER — Encounter (HOSPITAL_COMMUNITY): Payer: Self-pay | Admitting: Orthopedic Surgery

## 2014-03-22 ENCOUNTER — Inpatient Hospital Stay
Admission: RE | Admit: 2014-03-22 | Discharge: 2014-04-14 | Disposition: A | Discharge status: Home / Self Care | Payer: Medicare Other | Source: Ambulatory Visit | Attending: Internal Medicine | Admitting: Internal Medicine

## 2014-03-22 DIAGNOSIS — F10931 Alcohol use, unspecified with withdrawal delirium: Secondary | ICD-10-CM

## 2014-03-22 DIAGNOSIS — F10231 Alcohol dependence with withdrawal delirium: Secondary | ICD-10-CM

## 2014-03-22 DIAGNOSIS — R609 Edema, unspecified: Principal | ICD-10-CM

## 2014-03-22 DIAGNOSIS — M79605 Pain in left leg: Secondary | ICD-10-CM

## 2014-03-22 HISTORY — DX: Alcohol dependence with withdrawal delirium: F10.231

## 2014-03-22 HISTORY — DX: Alcohol use, unspecified with withdrawal delirium: F10.931

## 2014-03-22 NOTE — Progress Notes (Signed)
03/22/14  1525  Called report to nurse at [facility] at [phone]

## 2014-03-22 NOTE — H&P (Deleted)
03/22/14 1525 Called report to nurse at [facility] rehab at [phone]

## 2014-03-22 NOTE — Progress Notes (Signed)
     Subjective:  Patient reports pain as mild.  Patient wants to go home, does not want to go to skilled nursing, and says she has adequate help at home now.   Objective:   VITALS:   Filed Vitals:   03/21/14 1949 03/21/14 2000 03/22/14 0000 03/22/14 0440  BP: 172/104   157/96  Pulse: 92   96  Temp: 97.6 F (36.4 C)   97.6 F (36.4 C)  TempSrc: Oral   Oral  Resp: 18 18 18 18   Height:      Weight:      SpO2: 100% 100% 100% 98%   All surgical dressings are clean, EHL and FHL are firing.  Lab Results  Component Value Date   WBC 9.5 03/19/2014   HGB 9.7* 03/19/2014   HCT 29.5* 03/19/2014   MCV 94.2 03/19/2014   PLT 163 03/19/2014     Assessment/Plan: 6 Days Post-Op   Principal Problem:   Osteoarthritis of left knee Active Problems:   Knee osteoarthritis    she did have an episode of what sounds like alcohol withdrawal, treated successfully with CIWA protocol. Her mental status is now normal, and we are to plan to discharge home per her wishes. A plan to see her back in 2 weeks.   Johnny Bridge 03/22/2014, 7:39 AM

## 2014-03-22 NOTE — Care Management Note (Signed)
CARE MANAGEMENT NOTE 03/22/2014  Patient:  KIERSTIN, JANUARY   Account Number:  [id number]  Date Initiated:  03/17/2014  Documentation initiated by:  Ricki Miller  Subjective/Objective Assessment:   53 yr old female s/p left total knee arthroplasty.     Action/Plan:   Patient is for shorterm rehab at [facility]. Social worker notified.   Anticipated DC Date:  03/18/2014   Anticipated DC Plan:  SKILLED NURSING FACILITY  In-house referral  Clinical Social Worker      DC Planning Services  CM consult      [facility] Choice  NA   Choice offered to / List presented to:             Status of service:  Completed, signed off Medicare Important Message given?  NA - LOS <3 / Initial given by admissions (If response is "NO", the following Medicare IM given date fields will be blank) Date Medicare IM given:   Date Additional Medicare IM given:    Discharge Disposition:  [place]  Per UR Regulation:  Reviewed for med. necessity/level of care/duration of stay  If discussed at [place] of Stay Meetings, dates discussed:    Comments:  03/22/14 Ricki Miller, RN BSN Case Manager Case Manager spoke with patient concerning discharge plan. She had been setup with [facility], but therapist and family state she needs SNF. Case manager informed patient that social worker will further discuss discharge plan to [facility].

## 2014-03-22 NOTE — Progress Notes (Signed)
Clinical social worker assisted with patient discharge to skilled nursing facility, [facility].  CSW addressed all family questions and concerns. CSW copied chart and added all important documents. Patient transported by private vehicle. Clinical Social Worker will sign off for now as social work intervention is no longer needed.   Rhea Pink, MSW, [place]

## 2014-03-22 NOTE — Progress Notes (Signed)
Agree with SPT.    Kimbery Harwood, PT [phone]  

## 2014-03-22 NOTE — Progress Notes (Signed)
Physical Therapy Treatment Patient Details Name: [name] MRN: [id number] DOB: Feb 26, 1961 Today's Date: 03/22/2014    History of Present Illness 53 y.o. Female S/p L TKA on 03/16/14, hx of HIV, bipolar disorder, chronic back pain, HTN, GERD    PT Comments    Pt performed bed mobility, sit to/from stand transfers, and ambulation with supervision-min guard assist today for VC for safety and body mechanics.  Pt declined to ambulate further than 25 ft with RW and continues to rely heavily on bil. UE on her RW.  Pt was able to recall the three exercises she was given in PT yesterday with tactile cueing to initiate first quad set.  Will continue to follow.     Follow Up Recommendations  SNF     Equipment Recommendations  None recommended by PT    Recommendations for Other Services       Precautions / Restrictions Precautions Precautions: Knee;Fall Required Braces or Orthoses: Knee Immobilizer - Left Knee Immobilizer - Left: Discontinue once straight leg raise with < 10 degree lag Restrictions Weight Bearing Restrictions: Yes LLE Weight Bearing: Weight bearing as tolerated    Mobility  Bed Mobility Overal bed mobility: Needs Assistance Bed Mobility: Supine to Sit     Supine to sit: HOB elevated;Supervision     General bed mobility comments: Pt performed supine to sit transfer with no physical assist and mod VC to stay focused on task and move LE over EOB.  Transfers Overall transfer level: Needs assistance Equipment used: Rolling walker (2 wheeled) Transfers: Sit to/from Stand Sit to Stand: Min guard         General transfer comment: Pt able to stand from sitting on EOB with VC to lean forward before powering up to standing.  Pt reminded to push on EOB with UEs rather than pulling on RW.  Ambulation/Gait Ambulation/Gait assistance: Min guard Ambulation Distance (Feet): 25 Feet Assistive device: Rolling walker (2 wheeled) Gait Pattern/deviations: Step-to  pattern;Decreased step length - left;Decreased stance time - left;Trunk flexed Gait velocity: decreased Gait velocity interpretation: Below normal speed for age/gender General Gait Details: VC provided for pt to keep RW near her rather than pushing it out in front of her. Pt declined ambulation beyond 25 ft.    Stairs            Wheelchair Mobility    Modified Rankin (Stroke Patients Only)       Balance Overall balance assessment: Needs assistance Sitting-balance support: Single extremity supported;Feet unsupported Sitting balance-Leahy Scale: Fair     Standing balance support: Bilateral upper extremity supported;During functional activity Standing balance-Leahy Scale: Poor Standing balance comment: Pt maintained heavy bil. UE support throughout standing activities.                    Cognition Arousal/Alertness: Awake/alert Behavior During Therapy: WFL for tasks assessed/performed Overall Cognitive Status: Within Functional Limits for tasks assessed                      Exercises Total Joint Exercises Ankle Circles/Pumps: AROM;Both;Other reps (comment);Seated (1 rep, pt declined additional reps) Quad Sets: AROM;Left;Other reps (comment);Seated (1 rep, pt declined additional reps) Gluteal Sets: Strengthening;Both;Other reps (comment);Seated (1 rep, pt declined additional reps)    General Comments General comments (skin integrity, edema, etc.): With tactile cueing for quad sets, pt was able to recall all 3 exercises from 5/31.      Pertinent Vitals/Pain Pt reported increased pain upon PT arrival.  Nurse  notified.    Home Living                      Prior Function            PT Goals (current goals can now be found in the care plan section) Acute Rehab PT Goals Patient Stated Goal: to return home PT Goal Formulation: With patient Time For Goal Achievement: 03/26/14 Potential to Achieve Goals: Good Progress towards PT goals:  Progressing toward goals    Frequency  7X/week    PT Plan Current plan remains appropriate    Co-evaluation             End of Session Equipment Utilized During Treatment: Gait belt;Left knee immobilizer Activity Tolerance: Patient tolerated treatment well Patient left: in chair;with call bell/phone within reach;with nursing/sitter in room;with chair alarm set     Time: 5400-8676 PT Time Calculation (min): 26 min  Charges:                       G Codes:      Rayyan Burley, SPT 03/22/2014, 9:41 AM

## 2014-03-23 ENCOUNTER — Other Ambulatory Visit: Payer: Self-pay | Admitting: *Deleted

## 2014-03-23 LAB — GLUCOSE, CAPILLARY: Glucose-Capillary: 95 mg/dL (ref 70–99)

## 2014-03-23 MED ORDER — DIAZEPAM 5 MG PO TABS: 5.0000 mg | ORAL_TABLET | Freq: Two times a day (BID) | ORAL | Status: DC | PRN

## 2014-03-23 MED ORDER — ALPRAZOLAM 1 MG PO TABS: ORAL_TABLET | ORAL | Status: DC

## 2014-03-23 MED ORDER — OXYCODONE-ACETAMINOPHEN 10-325 MG PO TABS: ORAL_TABLET | ORAL | Status: DC

## 2014-03-23 MED ORDER — HYDROMORPHONE HCL 2 MG PO TABS: ORAL_TABLET | ORAL | Status: DC

## 2014-03-23 MED ORDER — DIPHENOXYLATE-ATROPINE 2.5-0.025 MG PO TABS: 1.0000 | ORAL_TABLET | Freq: Four times a day (QID) | ORAL | Status: DC | PRN

## 2014-03-23 MED ORDER — HYDROCODONE-ACETAMINOPHEN 5-325 MG PO TABS: ORAL_TABLET | ORAL | Status: DC

## 2014-03-23 MED ORDER — ZOLPIDEM TARTRATE 5 MG PO TABS: ORAL_TABLET | ORAL | Status: DC

## 2014-03-23 NOTE — Telephone Encounter (Signed)
[facility] 

## 2014-03-24 ENCOUNTER — Non-Acute Institutional Stay (SKILLED_NURSING_FACILITY): Payer: Medicare Other | Admitting: Internal Medicine

## 2014-03-24 ENCOUNTER — Other Ambulatory Visit: Payer: Medicare Other

## 2014-03-24 DIAGNOSIS — R32 Unspecified urinary incontinence: Secondary | ICD-10-CM

## 2014-03-24 DIAGNOSIS — B20 Human immunodeficiency virus [HIV] disease: Secondary | ICD-10-CM

## 2014-03-24 DIAGNOSIS — J441 Chronic obstructive pulmonary disease with (acute) exacerbation: Secondary | ICD-10-CM

## 2014-03-24 DIAGNOSIS — Z96652 Presence of left artificial knee joint: Secondary | ICD-10-CM

## 2014-03-24 DIAGNOSIS — Z96659 Presence of unspecified artificial knee joint: Secondary | ICD-10-CM

## 2014-03-24 LAB — GLUCOSE, CAPILLARY
GLUCOSE-CAPILLARY: 153 mg/dL — AB (ref 70–99)
Glucose-Capillary: 94 mg/dL (ref 70–99)

## 2014-03-24 NOTE — Progress Notes (Signed)
Patient ID: [name], female   DOB: 1960-12-17, 53 y.o.   MRN: [id number]  Facility; [facility] Chief complaint; admission to SNF post admit to [facility] from 5/26 to 6/1  History; this is a 53 year old medically complex woman who underwent an elective left total knee replacement. This was for severe osteoarthritis of the left knee. Her postoperative course seems to have been dominated by alcohol withdrawal delirium although I have none of these details at the moment. He would appear that she also has COPD/asthma and is an active smoker.  Past Medical History  Diagnosis Date  . Baker's cyst   . Pulmonary lesion 1/03    on RUL onn CT   . Hemorrhoids   . Zoster 2001  . Family history of diabetes mellitus   . Tobacco user   . Other abnormal Papanicolaou smear of cervix and cervical HPV(795.09)   . Herpes zoster   . Hemorrhoids   . Baker's cyst   . Pancreatitis   . HIV disease     take Prezista,Truvada,Norvir,and Isentress daily  . Bipolar disorder     With hx of psychotic features.  . Articular cartilage disorder of knee   . Bursitis of shoulder   . Rotator cuff arthropathy   . Hypertension     takes Clonidine and Verapamil daily  . Emphysema   . Asthma     Albuterol resue inhaler and QVAR daily  . Shortness of breath     with exertion and stress  . Dizziness   . Joint pain   . Joint swelling   . Chronic back pain     buldging disc  . Bruises easily   . Gastric ulcer   . GERD (gastroesophageal reflux disease)     takes Nexium daily  . Chronic constipation   . Urinary frequency   . Urinary incontinence   . Nocturia   . History of blood transfusion at age 64  . Early cataracts, bilateral   . Anxiety   . Depression     takes Xanax daily and Depakote bid  . History of shingles   . 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 Tobacco abuse 10/26/2012  . Chronic back pain   . Chronic knee pain   . Complication of anesthesia      AGGITATED  . Uterine cancer     "had some kind of cancer in my teens; had to have TAH to remove it"  . Headache(784.0)     "often here lately; weather changing"  . Pneumonia     "several times"  . Arthritis     "all over" (03/16/2014)  . Osteoarthritis of left knee 03/16/2014  . Alcohol withdrawal delirium 03/22/2014   Past Surgical History  Procedure Laterality Date  . Breast lumpectomy Right   . Breast biopsy Right ~ 1980  . Cholecystectomy  10+yrs ago  . Knee arthroscopy Bilateral   . Finger surgery Right     rt middle finger with pin in it  . Epidural injections    . Esophagogastroduodenoscopy    . Shoulder hemi-arthroplasty  05/06/2012    Procedure: SHOULDER HEMI-ARTHROPLASTY;  Surgeon: Johnny Bridge, MD;  Location: [facility];  Service: Orthopedics;  Laterality: Right;  . Joint replacement    . Fracture surgery      Right finger third digit  . Total knee arthroplasty Left 03/16/2014  . Appendectomy  1981    with hysterectomy  . Total abdominal hysterectomy  1981  w/BSO  . Total knee arthroplasty Left 03/16/2014    Procedure: LEFT TOTAL KNEE ARTHROPLASTY;  Surgeon: Johnny Bridge, MD;  Location: [facility];  Service: Orthopedics;  Laterality: Left;   Current Outpatient Prescriptions on File Prior to Visit  Medication Sig Dispense Refill  . albuterol (PROVENTIL HFA;VENTOLIN HFA) 108 (90 BASE) MCG/ACT inhaler Inhale 2 puffs into the lungs every 4 (four) hours as needed for shortness of breath.       Marland Kitchen albuterol (PROVENTIL) (2.5 MG/3ML) 0.083% nebulizer solution Take 2.5 mg by nebulization every 6 (six) hours as needed for wheezing or shortness of breath.      . ALPRAZolam (XANAX) 1 MG tablet Take one tablet by mouth twice daily as needed for anxiety  60 tablet  5  . Azelastine HCl 0.15 % SOLN Place 1 spray into the nose daily as needed (for allergies).       . beclomethasone (QVAR) 40 MCG/ACT inhaler Inhale 2 puffs into the lungs 2 (two) times daily.      . bisacodyl (DULCOLAX) 5 MG  EC tablet Take 1 tablet (5 mg total) by mouth daily as needed for moderate constipation.  30 tablet  0  . cetirizine (ZYRTEC) 10 MG tablet Take 10 mg by mouth at bedtime.       . cloNIDine (CATAPRES) 0.2 MG tablet Take 0.1 mg by mouth at bedtime.      . cyanocobalamin (,VITAMIN B-12,) 1000 MCG/ML injection Inject 1,000 mcg into the muscle every 30 (thirty) days.      . Darunavir Ethanolate (PREZISTA) 800 MG tablet Take 800 mg by mouth daily with breakfast.      . diazepam (VALIUM) 5 MG tablet Take 1 tablet (5 mg total) by mouth every 12 (twelve) hours as needed for muscle spasms.  60 tablet  0  . diphenoxylate-atropine (LOMOTIL) 2.5-0.025 MG per tablet Take 1 tablet by mouth 4 (four) times daily as needed for diarrhea or loose stools.  120 tablet  0  . emtricitabine-tenofovir (TRUVADA) 200-300 MG per tablet Take 1 tablet by mouth daily.  30 tablet  1  . enoxaparin (LOVENOX) 30 MG/0.3ML injection Inject 0.3 mLs (30 mg total) into the skin every 12 (twelve) hours.  42 Syringe  0  . esomeprazole (NEXIUM) 40 MG capsule Take 40 mg by mouth daily before breakfast.      . folic acid (FOLVITE) 1 MG tablet Take 1 mg by mouth daily.      Marland Kitchen HYDROmorphone (DILAUDID) 2 MG tablet Take one tablet by mouth every 4 hours as needed for severe pain  180 tablet  0  . methocarbamol (ROBAXIN) 750 MG tablet Take 1 tablet (750 mg total) by mouth every 6 (six) hours as needed for muscle spasms.  75 tablet  1  . naphazoline-pheniramine (NAPHCON-A) 0.025-0.3 % ophthalmic solution Place 1 drop into both eyes 4 (four) times daily as needed for irritation.      . nicotine (NICODERM CQ - DOSED IN MG/24 HOURS) 21 mg/24hr patch Place 21 mg onto the skin daily.      . ondansetron (ZOFRAN) 4 MG tablet Take 4 mg by mouth every 8 (eight) hours as needed for nausea or vomiting.      Marland Kitchen oxybutynin (DITROPAN-XL) 10 MG 24 hr tablet Take 10 mg by mouth 2 (two) times daily.      Marland Kitchen oxyCODONE-acetaminophen (PERCOCET) 10-325 MG per tablet Take  one or two tablets by mouth every 6 hours as needed for pain  120 tablet  0  . Phenazopyridine HCl (AZO TABS PO) Take 2 tablets by mouth daily as needed (urinary symptoms).      . potassium chloride (K-DUR) 10 MEQ tablet Take 10 mEq by mouth daily.      . predniSONE (DELTASONE) 20 MG tablet Take 20 mg by mouth daily with breakfast.      . promethazine (PHENERGAN) 25 MG tablet Take 25 mg by mouth every 6 (six) hours as needed for nausea.      . promethazine (PHENERGAN) 25 MG tablet Take 1 tablet (25 mg total) by mouth every 6 (six) hours as needed for nausea or vomiting.  30 tablet  0  . raltegravir (ISENTRESS) 400 MG tablet Take 400 mg by mouth 2 (two) times daily.      . ritonavir (NORVIR) 100 MG capsule Take 1 capsule (100 mg total) by mouth daily with breakfast.  30 capsule  1  . sennosides-docusate sodium (SENOKOT-S) 8.6-50 MG tablet Take 2 tablets by mouth daily.  30 tablet  1  . verapamil (CALAN-SR) 240 MG CR tablet Take 240 mg by mouth daily.      . Vitamin D, Ergocalciferol, (DRISDOL) 50000 UNITS CAPS Take 50,000 Units by mouth every 7 (seven) days. Take on mondays      . zolpidem (AMBIEN) 5 MG tablet Take one tablet by mouth at bedtime as needed for sleep  30 tablet  5  . [DISCONTINUED] QUEtiapine (SEROQUEL) 100 MG tablet Take 1 tablet (100 mg total) by mouth at bedtime. Take one tablet by mouth at bedtime          Social; agent lives in her own home in [place]. Lives with another person. As far as I am aware she was independent .  reports that she has been smoking Cigarettes.  She has a 64 pack-year smoking history. She has never used smokeless tobacco. She reports that she drinks about 5.4 ounces of alcohol per week. She reports that she uses illicit drugs (Marijuana).  family history includes Cancer (age of onset: 4) in her sister; Diabetes in her brother, father, mother, and another family member; Heart failure in her mother; Hypertension in her mother and another family  member.  Review of systems Respiratory; loose nonproductive cough. States she uses nebulizers on most days at home prior to the surgery [albuterol] Cardiac no chest pain GI no abdominal pain no diarrhea GU reports chronic urinary incontinence. She has virtually no sensation of having to void and doesn't realize she is voiding mental she feels Musculoskeletal; complains of left knee and leg pain  Physical examinatioP-111, RR 20 O2 sat 96% on room air Gen. patient is not in acute distress. Respiratory; shallow air entry bilaterally with prolonged expiratory phase. She is not tachypneic. No accessory muscle use. mild clubbing Cardiac; no complaints of chest pain heart sounds are normal Abdomen no tenderness Extremities left knee incision is well approximated there is no evidence of infection and very little swelling Gait I did not attempt to ambulate her apparently this has been difficult so far  Impression/plan #1 status post elective left total knee replacement. The actual incision site looks fine #2 COPD she has nebulizers when necessary I am going to suggest using those routinely perhaps 4 times a day while awake for the next few days #3 on a nicotine patch. The patient does not want this I suspect she will smoke #4 HIV on the usual extensive list of medications. #5 hypertension on clonidine and verapamil. #6 has a right upper  lobe pulmonary lesion on CT not sure of the exact workup here  This is a medically complex patient who underwent an elective left total knee replacement. She is on Lovenox for DVT prophylaxis which probably should continue for 2 weeks. Most concerning thing here is her pulmonary status I would like to use the nebulizers routinely for the next few days and then we can back to the current orders of every 6 when necessary. Even before the surgery she tells me she used this on a fairly frequent basis. She does have chronic urinary incontinence. The exact etiology of this  is unclear. Finally she is on prednisone 20 mg daily I don't know anything about this need to look through [place] blanket to see if there is a reason for this other than her current pulmonary status

## 2014-03-25 LAB — GLUCOSE, CAPILLARY: GLUCOSE-CAPILLARY: 106 mg/dL — AB (ref 70–99)

## 2014-03-26 LAB — GLUCOSE, CAPILLARY: GLUCOSE-CAPILLARY: 106 mg/dL — AB (ref 70–99)

## 2014-03-27 LAB — GLUCOSE, CAPILLARY: Glucose-Capillary: 92 mg/dL (ref 70–99)

## 2014-03-28 LAB — GLUCOSE, CAPILLARY: Glucose-Capillary: 104 mg/dL — ABNORMAL HIGH (ref 70–99)

## 2014-03-30 ENCOUNTER — Non-Acute Institutional Stay (SKILLED_NURSING_FACILITY): Payer: Medicare Other | Admitting: Internal Medicine

## 2014-03-30 ENCOUNTER — Encounter: Payer: Self-pay | Admitting: Internal Medicine

## 2014-03-30 DIAGNOSIS — I10 Essential (primary) hypertension: Secondary | ICD-10-CM

## 2014-03-30 DIAGNOSIS — J42 Unspecified chronic bronchitis: Secondary | ICD-10-CM

## 2014-03-30 DIAGNOSIS — Z96659 Presence of unspecified artificial knee joint: Secondary | ICD-10-CM

## 2014-03-30 DIAGNOSIS — F411 Generalized anxiety disorder: Secondary | ICD-10-CM

## 2014-03-30 DIAGNOSIS — F419 Anxiety disorder, unspecified: Secondary | ICD-10-CM

## 2014-03-30 DIAGNOSIS — B20 Human immunodeficiency virus [HIV] disease: Secondary | ICD-10-CM

## 2014-03-30 LAB — GLUCOSE, CAPILLARY: Glucose-Capillary: 113 mg/dL — ABNORMAL HIGH (ref 70–99)

## 2014-03-30 NOTE — Progress Notes (Signed)
Patient ID: [name], female   DOB: Apr 17, 1961, 53 y.o.   MRN: [id number]   This is an acute visit.  Level of care skilled.  Facility [facility].  Chief complaint-acute visit secondary to possible discharge.  History of present illness.  Patient is a 53 year old female with a medically complex history who underwent an elective left total knee replacement secondary to severe osteoarthritis.  Apparently postop course was complicated by alcohol withdrawal delirium-she also has a history of COPD asthma and continues to be an active smoker.  She has been here for rehabilitation but apparently there's been an instance of patient's smoking in the facility and she has been slated  for discharge.  Currently patient has no acute complaints although she appears somewhat anxious and upset wanting family to take her home.  Her vital signs have been stable-.  Previous medical history.  Quite extensive and significant for history of HIV-bipolar disorder-emphysema-asthma-chronic back pain-GERD-anxiety and depression-avascular necrosis secondary to drugs antiretrovirals-tobacco abuse.  .  Medications have been reviewed per MAR.  Family medical social history reviewed per admission note on 03/24/2014-has her own home apparently [place] lives apparently with another person I believe her boyfriend-apparently previously been independent.  Continues to smoke cigarettes with the 64-pack-year smoking history-apparently also drank 5+ ounces of alcohol per week and has used illicit drugs marijuana in the past family history reviewed includes cancer onset 71 her sister-a brother with diabetes-as well as diabetes in a brother father mother and another family member.  Mother had a history of heart failure and hypertension.  Review of systems.  Somewhat limited since patient is somewhat anxious and upset.  Gen. no complaints of fever or chills.  Respiratory does not complain of shortness breath or  cough does have a fairly extensive history here it appears of asthma emphysema. she is on nebulizers.  Cardiac is not complaining of chest pain has some mild left lower extremity edema status post surgery but according to nursing staff this has improved  Muscle skeletal does complain of left knee pain she is on significant medications including Dilaudid and Percocet.  Neurologic does not really complain of any headache or dizziness.  Psych has a fairly extensive history including depression it appears anxiety and bipolar disorder-as well as substance abuse history.  Physical exam.  Temperature 98.1 pulse 70 respirations 20 blood pressure 120/68-again I did get a pulse 100 during exam patient was somewhat upset that time.  In general this is a somewhat obese middle-aged female in no distress although anxious sitting in her wheelchair.  Her skin is warm and dry surgical site left knee Steri-Strips are in place there is crusting -- there is some postop edema and tenderness but this appears typical postop presentation I do not see a sign of infection  Oropharynx is clear mucous membranes moist.  Chest has shallow air entry prolonged recovery phase however I do not really hear any significant congestion-there is no labored breathing.  Heart is regular rate and rhythm without murmur gallop or it is borderline tachycardic at 100-she has trace lower extremity edema and again some postop edema at the surgical site on left knee per nursing this has improved.  Abdomen is soft nontender with positive bowel sounds.  Muscle skeletal is ambulating in a wheelchair upper extremity strength appears to be intact with good strength-surgical site left knee as noted above.  Neurologic is grossly intact her speech is clear no lateralizing findings.  Psych she is alert and oriented although somewhat upset  and anxious.    Labs.  03/18/2014.  Hemoglobin 10.0.  03/19/2014-WBC 9.5 platelets  163.  03/17/2014.  Sodium 138 potassium 4.6 BUN 7 creatinine 0.54.  Assessment and plan.  Possible discharge status post rehabilitation for left knee replacement.--- Patient is desiring to go home however her family is quite concerned feeling there would not be able to take care of her adequately at home she does apparently have a step into her house-family has had an extensive discussion with the patient as well as with the facility staff about patient staying- -Clinically she appears stable although certainly would benefit from continued therapy N. Monitor of her medical issues including COPD hypertension .  At this point will make discharge tentative.  Addendum-apparently patient family and facility have determined that patient will stay in the facility for now-will defer discharge-Will update laboratories including a CBC and BMP.   in regards to her knee replacement this appears stable apparently she will be seeing the orthopedic surgeon tomorrow she continues on Dilaudid and oxycodone for pain management apparently she asked for this fairly frequently-she is on Lovenox for anticoagulation.  In regards to her emphysema she is on when necessary albuterol nebulizers as well as inhaler-this appears relatively stable she had been given them routinely recently secondary to Dr. Janalyn Rouse concern of increased congestion which appears to have improved. Also continues on prednisone--  Hypertension-this appears relatively stable on clonidine -- verapamil recent blood pressures 120/60 8S 135/76-122/75.  I do not somewhat elevated pulse today but she is quite upset it appears her baseline pulses are more in the 70-80 range with occasional pulse is just above 100 I suspect this is because of anxiety.  History of depression bipolar disorder--anxiety-apparently patient does have agitationat times will continue to monitor-she continues on Xanax twice a day as needed for anxiety that she also has Ambien at  night her sleep.  She's also has Valium as needed this is listed for muscle spasms.  History of HIV-continues onRaltegravir-ritonavir-darunavir---emtricitabine-tenofovir-- is followed by infectious disease.  I do note she is on potassium  will check a metabolic panel tomorrow as well as update her CBC.  Clinically she appears stable again would benefit from continued therapy and family apparently has assured staff that they will cooperate with preventing the patient from smoking in the facility.  [id number] note more than 35 minutes spent assessing patient-discussing her status with nursing staff-and coordinating plan of care for numerous diagnoses-of note greater than 50% of time spent coordinating her care including discussion of possible discharge

## 2014-03-31 LAB — GLUCOSE, CAPILLARY: Glucose-Capillary: 113 mg/dL — ABNORMAL HIGH (ref 70–99)

## 2014-04-01 LAB — GLUCOSE, CAPILLARY: Glucose-Capillary: 106 mg/dL — ABNORMAL HIGH (ref 70–99)

## 2014-04-02 LAB — GLUCOSE, CAPILLARY: Glucose-Capillary: 89 mg/dL (ref 70–99)

## 2014-04-03 LAB — GLUCOSE, CAPILLARY: Glucose-Capillary: 136 mg/dL — ABNORMAL HIGH (ref 70–99)

## 2014-04-04 LAB — GLUCOSE, CAPILLARY
Glucose-Capillary: 117 mg/dL — ABNORMAL HIGH (ref 70–99)
Glucose-Capillary: 231 mg/dL — ABNORMAL HIGH (ref 70–99)

## 2014-04-05 LAB — GLUCOSE, CAPILLARY: Glucose-Capillary: 94 mg/dL (ref 70–99)

## 2014-04-06 LAB — GLUCOSE, CAPILLARY: Glucose-Capillary: 110 mg/dL — ABNORMAL HIGH (ref 70–99)

## 2014-04-07 ENCOUNTER — Ambulatory Visit: Payer: Medicare Other | Admitting: Infectious Diseases

## 2014-04-07 LAB — GLUCOSE, CAPILLARY: GLUCOSE-CAPILLARY: 99 mg/dL (ref 70–99)

## 2014-04-08 LAB — GLUCOSE, CAPILLARY: GLUCOSE-CAPILLARY: 118 mg/dL — AB (ref 70–99)

## 2014-04-09 LAB — GLUCOSE, CAPILLARY: Glucose-Capillary: 114 mg/dL — ABNORMAL HIGH (ref 70–99)

## 2014-04-10 LAB — GLUCOSE, CAPILLARY: Glucose-Capillary: 93 mg/dL (ref 70–99)

## 2014-04-11 LAB — GLUCOSE, CAPILLARY: Glucose-Capillary: 102 mg/dL — ABNORMAL HIGH (ref 70–99)

## 2014-04-12 LAB — GLUCOSE, CAPILLARY: Glucose-Capillary: 114 mg/dL — ABNORMAL HIGH (ref 70–99)

## 2014-04-13 ENCOUNTER — Encounter: Payer: Self-pay | Admitting: Internal Medicine

## 2014-04-13 ENCOUNTER — Ambulatory Visit (HOSPITAL_COMMUNITY)
Admit: 2014-04-13 | Discharge: 2014-04-13 | Disposition: A | Discharge status: Home / Self Care | Payer: Medicare Other | Attending: Internal Medicine | Admitting: Internal Medicine

## 2014-04-13 ENCOUNTER — Non-Acute Institutional Stay (SKILLED_NURSING_FACILITY): Payer: Medicare Other | Admitting: Internal Medicine

## 2014-04-13 DIAGNOSIS — J410 Simple chronic bronchitis: Secondary | ICD-10-CM

## 2014-04-13 DIAGNOSIS — J449 Chronic obstructive pulmonary disease, unspecified: Secondary | ICD-10-CM | POA: Insufficient documentation

## 2014-04-13 DIAGNOSIS — R609 Edema, unspecified: Secondary | ICD-10-CM

## 2014-04-13 DIAGNOSIS — I10 Essential (primary) hypertension: Secondary | ICD-10-CM

## 2014-04-13 LAB — GLUCOSE, CAPILLARY: GLUCOSE-CAPILLARY: 117 mg/dL — AB (ref 70–99)

## 2014-04-13 NOTE — Progress Notes (Signed)
Patient ID: [name], female   DOB: 1961-07-22, 53 y.o.   MRN: [id number]   This is an acute visit.  Level care skilled.  Facility [facility].  Chief complaint-acute visit secondary to left leg edema.  History of present illness.  Patient is a 53 year old female who underwent an elective left total knee replacement secondary to severe osteoarthritis.  She has an extensive medical history including history of HIV this is followed by infectious disease as well as bipolar disorder anxiety hypertension as well as significant pain issues on numerous pain medications including Dilaudid and Percocet.  She has been doing relatively well however it was noted by nursing staff today she appears to have some increased edema of her left leg.  She has completed a course of Lovenox for DVT prophylaxis.  She is complaining of some mild discomfort here this does not appear to be in acute pain.  No complaints of chest pain or shortness of breath vital signs are stable.  Family medical social history as been reviewed per admission note on 03/24/2014   medications have been reviewed per [facility]  Review of systems.  In general no complaints of fever or chills.  Respiratory- is not complaining of shortness of breath or cough.  She does have a history of COPD  Cardiac-does have lower extremity edema left unilateral-does not complaining of chest pain.  GI no complaints of nausea vomiting diarrhea or constipation.  Neurologic no complaints of syncopal-type feelings dizziness or headache.  Psych does have a fairly extensive history as noted above.   physical exam.  Temperature is 98.3 pulse 100 respirations 20 blood pressure 124/90.--Weight is 180.6 this appears relatively stable gain of about 3 pounds over the past 3 weeks  In general this is a middle-aged female in no distress lying comfortably in bed she had been off her wheelchair earlier today and is here later in the day in her wheelchair as  well.  Her skin is warm and dry surgical site left knee there is well healed crusting there is no drainage or bleeding Steri-Strips are in place she does not appear to have increased erythema slight warmth..  Heart is borderline tachycardic without murmur gallop or rub it is regular as noted she does have 2+ lower extremy edema on the left with a reduced pedal pulse it is mildly tender to palpation but not acutely so.-  She has trace edema on the right lower extremity.  Abdomen is soft nontender with positive bowel sounds.  Muscle skeletal does move all extremities x4 with edema on the left as noted above.  Neurologic appears grossly intact no lateralizing findings  Labs.  12/11/2013.  WBC 10.5 hemoglobin 10.2 platelets 275.  Sodium 141 potassium 3.5 BUN 13 creatinine 0.68.  Alk-phos 127-AST 71-ALT 87.  Assessment and plan.  #1-left leg edema-with a history of recent left knee replacement-will order a venous Doppler to rule out DVT clinically she appears stable but she does have significant edema here.  Also will order a CBC metabolic panel to make sure her hemoglobin is stable one would also be concerned about possibly some blood loss here creating the edema  Lovenox as been DC'd-she does have an allergy to aspirin  Monitor her vital signs every shift with pulse ox x72 hours.   #2-history COPD-at this point this appears clinically stable on current medications including when necessary albuterol nebulizers--QVAR inhaleras well as prednisone  -#3-hypertension this appears stable recent blood pressures 124/90-113/89-continue current medications including Verapamil  and  clonidine  -  .  [id number]  .

## 2014-04-14 ENCOUNTER — Other Ambulatory Visit (HOSPITAL_COMMUNITY): Payer: Medicare Other

## 2014-04-14 ENCOUNTER — Ambulatory Visit (HOSPITAL_COMMUNITY): Payer: Medicare Other

## 2014-04-14 LAB — GLUCOSE, CAPILLARY: GLUCOSE-CAPILLARY: 120 mg/dL — AB (ref 70–99)

## 2014-04-16 NOTE — OR Nursing (Signed)
Addendum to scope page 

## 2014-05-20 ENCOUNTER — Other Ambulatory Visit: Payer: Self-pay | Admitting: *Deleted

## 2014-05-20 ENCOUNTER — Other Ambulatory Visit (HOSPITAL_BASED_OUTPATIENT_CLINIC_OR_DEPARTMENT_OTHER): Payer: Self-pay | Admitting: Internal Medicine

## 2014-05-20 MED ORDER — ZOLPIDEM TARTRATE 5 MG PO TABS: ORAL_TABLET | ORAL | Status: DC

## 2014-05-20 NOTE — Telephone Encounter (Signed)
rx faxed to [facility] @ [phone]. 

## 2014-05-24 ENCOUNTER — Ambulatory Visit (HOSPITAL_COMMUNITY)
Admission: RE | Admit: 2014-05-24 | Discharge: 2014-05-24 | Disposition: A | Discharge status: Home / Self Care | Payer: Medicare Other | Source: Ambulatory Visit | Attending: Orthopedic Surgery | Admitting: Orthopedic Surgery

## 2014-05-24 ENCOUNTER — Other Ambulatory Visit: Payer: Self-pay | Admitting: *Deleted

## 2014-05-24 DIAGNOSIS — IMO0001 Reserved for inherently not codable concepts without codable children: Secondary | ICD-10-CM | POA: Diagnosis not present

## 2014-05-24 DIAGNOSIS — R262 Difficulty in walking, not elsewhere classified: Secondary | ICD-10-CM | POA: Diagnosis present

## 2014-05-24 DIAGNOSIS — R29898 Other symptoms and signs involving the musculoskeletal system: Secondary | ICD-10-CM | POA: Insufficient documentation

## 2014-05-24 DIAGNOSIS — Z96659 Presence of unspecified artificial knee joint: Secondary | ICD-10-CM

## 2014-05-24 MED ORDER — ALPRAZOLAM 1 MG PO TABS: ORAL_TABLET | ORAL | Status: DC

## 2014-05-24 NOTE — Telephone Encounter (Signed)
Rx not sent to [facility]. Faxed the pharmacy to send Rx to Patient's Primary [facility]. Patient has been discharged from facility.

## 2014-05-24 NOTE — Evaluation (Signed)
Physical Therapy Evaluation  Patient Details  Name: [name] MRN: [id number] Date of Birth: Sep 29, 1961  Today's Date: 05/24/2014 Time: 0850-0930 PT Time Calculation (min): 40 min Charge:  evaluation             Visit#: 1 of 12  Re-eval: 06/23/14 Assessment Diagnosis: Lt TKR Surgical Date: 03/16/04 Next MD Visit: 06/02/2014 Prior Therapy: SNF, HH  Authorization: [facility] MEDICare    Past Medical History:  Past Medical History  Diagnosis Date  . Baker's cyst   . Pulmonary lesion 1/03    on RUL onn CT   . Hemorrhoids   . Zoster 2001  . Family history of diabetes mellitus   . Tobacco user   . Other abnormal Papanicolaou smear of cervix and cervical HPV(795.09)   . Herpes zoster   . Hemorrhoids   . Baker's cyst   . Pancreatitis   . HIV disease     take Prezista,Truvada,Norvir,and Isentress daily  . Bipolar disorder     With hx of psychotic features.  . Articular cartilage disorder of knee   . Bursitis of shoulder   . Rotator cuff arthropathy   . Hypertension     takes Clonidine and Verapamil daily  . Emphysema   . Asthma     Albuterol resue inhaler and QVAR daily  . Shortness of breath     with exertion and stress  . Dizziness   . Joint pain   . Joint swelling   . Chronic back pain     buldging disc  . Bruises easily   . Gastric ulcer   . GERD (gastroesophageal reflux disease)     takes Nexium daily  . Chronic constipation   . Urinary frequency   . Urinary incontinence   . Nocturia   . History of blood transfusion at age 53  . Early cataracts, bilateral   . Anxiety   . Depression     takes Xanax daily and Depakote bid  . History of shingles   . 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 Tobacco abuse 10/26/2012  . Chronic back pain   . Chronic knee pain   . Complication of anesthesia     AGGITATED  . Uterine cancer     "had some kind of cancer in my teens; had to have TAH to remove it"  . Headache(784.0)      "often here lately; weather changing"  . Pneumonia     "several times"  . Arthritis     "all over" (03/16/2014)  . Osteoarthritis of left knee 03/16/2014  . Alcohol withdrawal delirium 03/22/2014   Past Surgical History:  Past Surgical History  Procedure Laterality Date  . Breast lumpectomy Right   . Breast biopsy Right ~ 1980  . Cholecystectomy  10+yrs ago  . Knee arthroscopy Bilateral   . Finger surgery Right     rt middle finger with pin in it  . Epidural injections    . Esophagogastroduodenoscopy    . Shoulder hemi-arthroplasty  05/06/2012    Procedure: SHOULDER HEMI-ARTHROPLASTY;  Surgeon: Johnny Bridge, MD;  Location: [place];  Service: Orthopedics;  Laterality: Right;  . Joint replacement    . Fracture surgery      Right finger third digit  . Total knee arthroplasty Left 03/16/2014  . Appendectomy  1981    with hysterectomy  . Total abdominal hysterectomy  1981    w/BSO  . Total knee arthroplasty Left 03/16/2014    Procedure:  LEFT TOTAL KNEE ARTHROPLASTY;  Surgeon: Johnny Bridge, MD;  Location: [place];  Service: Orthopedics;  Laterality: Left;    Subjective Symptoms/Limitations Symptoms: [name] has had B knee pain for years she opted to have a TKR on her Lt knee 5/262015.  She was discharged to a SNF on 03/22/2014 and remained there until 04/13/2014.  She recieved [facility] until 05/14/14 and is now being referred to out-patient therapy.  She has a HH aide M-F 9-4.  She currently states she is not walking because it hurts too bad.  She currenly is in a wheelchair.  How long can you sit comfortably?: Able to sit for 20 minutes due to LBP How long can you stand comfortably?: Able to stand less than five minutes  How long can you walk comfortably?: Walks with a walker less than five minutes she states that she walks on her tip toes.   Patient Stated Goals: I need to walk; do normal stuff like go into the kitchen and make a sandwich. Pain Assessment Currently in Pain?: Yes Pain  Score: 10-Worst pain ever (lowest pain is a 5/10 with medication and LE elevated ) Pain Location: Knee Pain Orientation: Left Pain Type: Chronic pain Pain Onset: More than a month ago Pain Frequency: Constant Pain Relieving Factors: lying, meds, ice  Effect of Pain on Daily Activities: increases   Precautions/Restrictions  Precautions Precautions: Fall  Balance Screening Balance Screen Has the patient fallen in the past 6 months: Yes How many times?: 4 Has the patient had a decrease in activity level because of a fear of falling? : Yes Is the patient reluctant to leave their home because of a fear of falling? : Yes  Prior Functioning  Prior Function Vocation: On disability Leisure: Hobbies-no   Sensation/Coordination/Flexibility/Functional Tests Functional Tests Functional Tests: foto 1  Assessment LLE AROM (degrees) Left Knee Extension: 10 Left Knee Flexion: 110 LLE Strength Left Hip Flexion: 2-/5 Left Hip Extension: 2-/5 Left Hip ABduction: 2-/5 Left Knee Flexion: 2+/5 Left Knee Extension: 3+/5 Left Ankle Dorsiflexion: 3+/5  Exercise/Treatments Mobility/Balance  Transfers Transfers: Licensed conveyancer Transfers: 6: Modified independent (Device/Increase time)      Seated Long Arc Quad: 5 reps Supine Quad Sets: 10 reps Heel Slides: 5 reps Bridges: 5 reps Straight Leg Raises: Limitations Straight Leg Raises Limitations: bend knee raise x 10 Other Supine Knee Exercises: abduction/adduction x10    Prone  Hamstring Curl: 5 reps    Physical Therapy Assessment and Plan PT Assessment and Plan Clinical Impression Statement: Pt is a 53 yo female who has had a LT TKR in May.  The patient has not progressed very far functionally coming into the department in a wheelchair and stating that she can not walk.  The patient has had SNF and [facility] therapy following her acute stay.  Exam demonstrates hyper reaction to pain, decreased strength, decrased  balance.  [name] witll benefit from a trial of out-patient therapy to attempt to improve her functional ability.  [name] will need to be pushed both emotionally and physcially if she is to make any gains.  Pt will benefit from skilled therapeutic intervention in order to improve on the following deficits: Decreased balance;Decreased activity tolerance;Decreased strength;Pain;Difficulty walking;Decreased range of motion;Decreased mobility Rehab Potential: Fair PT Frequency: Min 3X/week PT Duration: 4 weeks PT Treatment/Interventions: Gait training;Therapeutic activities;Therapeutic exercise;Balance training;Patient/family education PT Plan: begin WB actrivity including heel toe gt with walker.      Goals Home Exercise Program Pt/caregiver  will Perform Home Exercise Program: For increased strengthening PT Goal: Perform Home Exercise Program - Progress: Goal set today PT Short Term Goals Time to Complete Short Term Goals: 2 weeks PT Short Term Goal 1: Pt pain to be no higher than a 7/10 80% of the day PT Short Term Goal 2: Pt to be able to walik heel toe with the walker inside her home  PT Short Term Goal 3: Pt  to be ale to stand for five minutes without difficulty to be able to complete selfgrooming while standing PT Short Term Goal 4: Pt strength increased 1/2 grade to allow the above to occur PT Long Term Goals Time to Complete Long Term Goals: 4 weeks PT Long Term Goal 1: I in advance HEP  PT Long Term Goal 2: Pain to be no greater than a 5/10 80% of the day. Long Term Goal 3: Pt to be walking inside with a cane with a normal gait pattern.  Long Term Goal 4: Pt to be able to come sit to stand with ease. PT Long Term Goal 5: Pt to be able to stand for ten minutes in order to make a sandwich without difficulty   Problem List Patient Active Problem List   Diagnosis Date Noted  . Left leg weakness 05/24/2014  . Edema 04/13/2014  . COPD (chronic obstructive pulmonary disease)  04/13/2014  . S/P knee replacement 03/30/2014  . Alcohol withdrawal delirium 03/22/2014  . Osteoarthritis of left knee 03/16/2014  . Knee osteoarthritis 03/16/2014  . Hypotension, unspecified 05/30/2013  . Dehydration 05/30/2013  . Primary lactic acidemia 05/30/2013  . Osteoarthritis of both knees 05/30/2013  . Acute respiratory failure with hypoxia 05/30/2013  . Other and unspecified hyperlipidemia 04/06/2013  . Anemia 10/27/2012  . Acute bronchitis 10/26/2012  . Leukopenia 10/26/2012  . Smoker 10/26/2012  . Hyperglycemia, drug-induced 10/26/2012  . Muscle weakness (generalized) 08/14/2012  . Unstable shoulder hemiarthroplasty 08/14/2012  . Anal warts 07/08/2012  . Pruritus 07/08/2012  . Urinary incontinence 07/01/2012  . 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 Abnormal EKG 03/28/2012  . Vulvovaginitis due to Candida 12/31/2011  . Shoulder pain, bilateral 11/14/2011  . Chest pain 09/26/2011  . Dysuria 07/27/2011  . Functional gait disorder 07/04/2011  . Abnormal thyroid blood test 07/04/2011  . Elevated LFTs 07/03/2011  . Anxiety 07/03/2011  . Bipolar 1 disorder 07/03/2011  . Drug induced acute pancreatitis 07/02/2011  . Bilateral knee pain 07/02/2011  . Avascular necrosis of femur head 07/02/2011  . Chronic pancreatitis 07/02/2011  . HTN (hypertension) 07/02/2011  . GERD (gastroesophageal reflux disease) 07/02/2011  . Pain in joint, lower leg 05/01/2011  . Stiffness of joint, not elsewhere classified, lower leg 05/01/2011  . Difficulty in walking(719.7) 05/01/2011  . Bronchitis, acute 03/29/2011  . ANKLE SPRAIN 09/26/2010  . SINUSITIS, ACUTE 03/06/2010  . CONSTIPATION, INTERMITTENT 12/19/2009  . ATYPICAL MYCOBACTERIAL INFECTION 11/16/2009  . Obesity, unspecified 10/11/2009  . IMPAIRED GLUCOSE TOLERANCE 08/11/2009  . FATIGUE 08/11/2009  . OTHER CHRONIC SINUSITIS 06/30/2009  . BACK PAIN WITH RADICULOPATHY 05/16/2009  .  PHARYNGITIS 04/04/2009  . ALLERGIC RHINITIS DUE TO OTHER ALLERGEN 02/21/2009  . GYNECOMASTIA 02/21/2009  . HEADACHE 02/21/2009  . URINARY INCONTINENCE 02/21/2009  . DEPRESSION 01/10/2009  . ACUTE CYSTITIS 10/05/2008  . NECK PAIN, LEFT 06/22/2008  . NAUSEA 06/22/2008  . HIP PAIN, LEFT 03/03/2007  . ABDOMINAL PAIN 03/03/2007  . ABNORMAL PAP SMEAR, LGSIL 12/03/2006  . HERPES ZOSTER 08/31/2006  . Unspecified essential  hypertension 08/31/2006  . HEMORRHOIDS 08/31/2006  . ASTHMA 08/31/2006  . GERD 08/31/2006  . BAKER'S CYST 08/31/2006  . PANCREATITIS, HX OF 02/19/2001  . Human immunodeficiency virus (HIV) disease 10/23/1999    PT Plan of Care PT Home Exercise Plan: given  GP Functional Assessment Tool Used: foto  Functional Limitation: Mobility: Walking and moving around Mobility: Walking and Moving Around Current Status (Z8588): At least 80 percent but less than 100 percent impaired, limited or restricted Mobility: Walking and Moving Around Goal Status [phone]): At least 60 percent but less than 80 percent impaired, limited or restricted  RUSSELL,CINDY 05/24/2014, 10:17 AM  Physician Documentation Your signature is required to indicate approval of the treatment plan as stated above.  Please sign and either send electronically or make a copy of this report for your files and return this physician signed original.   Please mark one 1.__approve of plan  2. ___approve of plan with the following conditions.   ______________________________                                                          _____________________ Physician Signature                                                                                                             Date

## 2014-06-03 ENCOUNTER — Other Ambulatory Visit: Payer: Self-pay | Admitting: *Deleted

## 2014-06-03 ENCOUNTER — Inpatient Hospital Stay (HOSPITAL_COMMUNITY): Admission: RE | Admit: 2014-06-03 | Payer: Medicare Other | Source: Ambulatory Visit

## 2014-06-04 ENCOUNTER — Ambulatory Visit (HOSPITAL_COMMUNITY): Admission: RE | Admit: 2014-06-04 | Payer: Medicare Other | Source: Ambulatory Visit | Admitting: Physical Therapy

## 2014-06-04 ENCOUNTER — Other Ambulatory Visit: Payer: Self-pay | Admitting: *Deleted

## 2014-06-04 ENCOUNTER — Other Ambulatory Visit: Payer: Self-pay | Admitting: Licensed Clinical Social Worker

## 2014-06-04 DIAGNOSIS — B20 Human immunodeficiency virus [HIV] disease: Secondary | ICD-10-CM

## 2014-06-04 MED ORDER — RALTEGRAVIR POTASSIUM 400 MG PO TABS: 400.0000 mg | ORAL_TABLET | Freq: Two times a day (BID) | ORAL | Status: DC

## 2014-06-04 NOTE — Telephone Encounter (Signed)
Isentress rx already refilled earlier today.  Pt receiving 3 out of 4 HIV rxes (Isentress/Norvir/Truvada) at [facility] and the fourth one(Prezista) at [facility].  Asked pt call [facility] to consolidate HIV regimen at one pharmacy for increased safety.  Will send refills for all medications to one pharmacy when pt replies.

## 2014-06-07 ENCOUNTER — Inpatient Hospital Stay (HOSPITAL_COMMUNITY): Admission: RE | Admit: 2014-06-07 | Payer: Medicare Other | Source: Ambulatory Visit

## 2014-06-09 ENCOUNTER — Ambulatory Visit (HOSPITAL_COMMUNITY): Payer: Medicare Other | Admitting: Physical Therapy

## 2014-06-09 ENCOUNTER — Telehealth (HOSPITAL_COMMUNITY): Payer: Self-pay | Admitting: Physical Therapy

## 2014-06-11 ENCOUNTER — Ambulatory Visit (HOSPITAL_COMMUNITY): Payer: Medicare Other

## 2014-06-14 ENCOUNTER — Ambulatory Visit (HOSPITAL_COMMUNITY)
Admission: RE | Admit: 2014-06-14 | Discharge: 2014-06-14 | Disposition: A | Discharge status: Home / Self Care | Payer: Medicare Other | Source: Ambulatory Visit | Attending: Orthopedic Surgery | Admitting: Orthopedic Surgery

## 2014-06-14 DIAGNOSIS — R29898 Other symptoms and signs involving the musculoskeletal system: Secondary | ICD-10-CM

## 2014-06-14 DIAGNOSIS — IMO0001 Reserved for inherently not codable concepts without codable children: Secondary | ICD-10-CM | POA: Diagnosis not present

## 2014-06-14 NOTE — Progress Notes (Signed)
Physical Therapy Treatment Patient Details  Name: [name] MRN: [id number] Date of Birth: 1961-04-25  Today's Date: 06/14/2014 Time: 1022-1117 PT Time Calculation (min): 55 min Charge: Gait 1022-1044, TE 3559-7416, LAG 5364-6803  Visit#: 2 of 12  Re-eval: 06/23/14 Assessment Diagnosis: Lt TKR Surgical Date: 03/16/04 Prior Therapy: SNF, HH  Authorization: [employer] MEDICare  Authorization Time Period:    Authorization Visit#:   of     Subjective: Symptoms/Limitations Symptoms: Pt entered dept ;limping with increased UE weight bearimg over RW, current pain scale 8/10 Lt knee Pain Assessment Currently in Pain?: Yes Pain Score: 8  Pain Location: Knee Pain Orientation: Left  Precautions/Restrictions  Precautions Precautions: Fall  Exercise/Treatments Standing Heel Raises: 10 reps;Limitations Heel Raises Limitations: Toe raises Rocker Board: 1 minute Gait Training: Gait training with RW x 226 feet with 3 rest breaks 1) 56', 2) 135'' 3) 35' Other Standing Knee Exercises: weight shifting   Modalities Modalities: Cryotherapy Cryotherapy Number Minutes Cryotherapy: 10 Minutes Cryotherapy Location: Knee Type of Cryotherapy: Ice pack  Physical Therapy Assessment and Plan PT Assessment and Plan Clinical Impression Statement: Pt educated on proper height with RW to reduce shoulder elevation and increased weight through UE.  RW height adjusted appropriately prior gait training.  Verbal cueing and demonstration to improve overall gait mechanics with cueing for heel to toe, equalized stance phase and stride length.  Weight shifting acitvities complete to improve weight distrubtion with gait.  Ended session with ice and elevation for pain and edema control.   PT Plan: Continue with current POC for improved gait mechanicns and weight bearing activtieis.      Goals PT Short Term Goals PT Short Term Goal 1: Pt pain to be no higher than a 7/10 80% of the day PT Short Term Goal 1  - Progress: Progressing toward goal PT Short Term Goal 2: Pt to be able to walik heel toe with the walker inside her home  PT Short Term Goal 2 - Progress: Progressing toward goal PT Short Term Goal 3: Pt  to be ale to stand for five minutes without difficulty to be able to complete selfgrooming while standing PT Short Term Goal 3 - Progress: Progressing toward goal PT Short Term Goal 4: Pt strength increased 1/2 grade to allow the above to occur PT Short Term Goal 4 - Progress: Progressing toward goal PT Long Term Goals PT Long Term Goal 1: I in advance HEP  PT Long Term Goal 2: Pain to be no greater than a 5/10 80% of the day. Long Term Goal 3: Pt to be walking inside with a cane with a normal gait pattern.  Long Term Goal 4: Pt to be able to come sit to stand with ease. PT Long Term Goal 5: Pt to be able to stand for ten minutes in order to make a sandwich without difficulty   Problem List Patient Active Problem List   Diagnosis Date Noted  . Left leg weakness 05/24/2014  . Edema 04/13/2014  . COPD (chronic obstructive pulmonary disease) 04/13/2014  . S/P knee replacement 03/30/2014  . Alcohol withdrawal delirium 03/22/2014  . Osteoarthritis of left knee 03/16/2014  . Knee osteoarthritis 03/16/2014  . Hypotension, unspecified 05/30/2013  . Dehydration 05/30/2013  . Primary lactic acidemia 05/30/2013  . Osteoarthritis of both knees 05/30/2013  . Acute respiratory failure with hypoxia 05/30/2013  . Other and unspecified hyperlipidemia 04/06/2013  . Anemia 10/27/2012  . Acute bronchitis 10/26/2012  . Leukopenia 10/26/2012  . Smoker 10/26/2012  .  Hyperglycemia, drug-induced 10/26/2012  . Muscle weakness (generalized) 08/14/2012  . Unstable shoulder hemiarthroplasty 08/14/2012  . Anal warts 07/08/2012  . Pruritus 07/08/2012  . Urinary incontinence 07/01/2012  . 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 Abnormal EKG 03/28/2012  .  Vulvovaginitis due to Candida 12/31/2011  . Shoulder pain, bilateral 11/14/2011  . Chest pain 09/26/2011  . Dysuria 07/27/2011  . Functional gait disorder 07/04/2011  . Abnormal thyroid blood test 07/04/2011  . Elevated LFTs 07/03/2011  . Anxiety 07/03/2011  . Bipolar 1 disorder 07/03/2011  . Drug induced acute pancreatitis 07/02/2011  . Bilateral knee pain 07/02/2011  . Avascular necrosis of femur head 07/02/2011  . Chronic pancreatitis 07/02/2011  . HTN (hypertension) 07/02/2011  . GERD (gastroesophageal reflux disease) 07/02/2011  . Pain in joint, lower leg 05/01/2011  . Stiffness of joint, not elsewhere classified, lower leg 05/01/2011  . Difficulty in walking(719.7) 05/01/2011  . Bronchitis, acute 03/29/2011  . ANKLE SPRAIN 09/26/2010  . SINUSITIS, ACUTE 03/06/2010  . CONSTIPATION, INTERMITTENT 12/19/2009  . ATYPICAL MYCOBACTERIAL INFECTION 11/16/2009  . Obesity, unspecified 10/11/2009  . IMPAIRED GLUCOSE TOLERANCE 08/11/2009  . FATIGUE 08/11/2009  . OTHER CHRONIC SINUSITIS 06/30/2009  . BACK PAIN WITH RADICULOPATHY 05/16/2009  . PHARYNGITIS 04/04/2009  . ALLERGIC RHINITIS DUE TO OTHER ALLERGEN 02/21/2009  . GYNECOMASTIA 02/21/2009  . HEADACHE 02/21/2009  . URINARY INCONTINENCE 02/21/2009  . DEPRESSION 01/10/2009  . ACUTE CYSTITIS 10/05/2008  . NECK PAIN, LEFT 06/22/2008  . NAUSEA 06/22/2008  . HIP PAIN, LEFT 03/03/2007  . ABDOMINAL PAIN 03/03/2007  . ABNORMAL PAP SMEAR, LGSIL 12/03/2006  . HERPES ZOSTER 08/31/2006  . Unspecified essential hypertension 08/31/2006  . HEMORRHOIDS 08/31/2006  . ASTHMA 08/31/2006  . GERD 08/31/2006  . BAKER'S CYST 08/31/2006  . PANCREATITIS, HX OF 02/19/2001  . Human immunodeficiency virus (HIV) disease 10/23/1999    PT - End of Session Equipment Utilized During Treatment: Gait belt Activity Tolerance: Patient limited by pain;Patient tolerated treatment well General Behavior During Therapy: [facility] for tasks  assessed/performed  GP    Aldona Lento 06/14/2014, 1:42 PM

## 2014-06-16 ENCOUNTER — Ambulatory Visit (HOSPITAL_COMMUNITY)
Admission: RE | Admit: 2014-06-16 | Discharge: 2014-06-16 | Disposition: A | Discharge status: Home / Self Care | Payer: Medicare Other | Source: Ambulatory Visit | Attending: Orthopedic Surgery | Admitting: Orthopedic Surgery

## 2014-06-16 DIAGNOSIS — R29898 Other symptoms and signs involving the musculoskeletal system: Secondary | ICD-10-CM

## 2014-06-16 DIAGNOSIS — IMO0001 Reserved for inherently not codable concepts without codable children: Secondary | ICD-10-CM | POA: Diagnosis not present

## 2014-06-16 NOTE — Progress Notes (Signed)
Physical Therapy Treatment Patient Details  Name: [name] MRN: [id number] Date of Birth: 1961-07-26  Today's Date: 06/16/2014 Time: 1023-1058 PT Time Calculation (min): 35 min Charge:  Gt training [phone]; there ex [phone] Visit#: 3 of 12  Re-eval: 06/23/14    Authorization: [employer] MED[employer]       Subjective: Symptoms/Limitations Symptoms: Pt comes into the department with a step to gait pattern as well as wheezing.  Pain Assessment Currently in Pain?: Yes Pain Score: 5  Pain Location: Knee Pain Orientation: Left Pain Type: Chronic pain   Exercise/Treatments Standing Heel Raises: 10 reps Knee Flexion: Left;Limitations Knee Flexion Limitations: 3# Gait Training: for step through gt with walker  Seated Long Arc Quad: 10 reps;Weights Long Arc Quad Weight: 3 lbs. Supine Quad Sets: 10 reps Heel Slides: 5 reps Terminal Knee Extension: 10 reps Bridges: 10 reps        Physical Therapy Assessment and Plan PT Assessment and Plan Clinical Impression Statement: Pt SOB O2 taken and was at 90 HR 120; pt used inhaler and O2 went up to 96, however, after standing activity pt O2 taken and O2 had decreased to 86.  Pt complained of being dizzy; BP was taken 125/85.  Pt completed limited session; she was urged  to contact her general MD concerning her high HR and low O2 level. PT Plan: Continue to work on gt, balance and strength.         Problem List Patient Active Problem List   Diagnosis Date Noted  . Left leg weakness 05/24/2014  . Edema 04/13/2014  . COPD (chronic obstructive pulmonary disease) 04/13/2014  . S/P knee replacement 03/30/2014  . Alcohol withdrawal delirium 03/22/2014  . Osteoarthritis of left knee 03/16/2014  . Knee osteoarthritis 03/16/2014  . Hypotension, unspecified 05/30/2013  . Dehydration 05/30/2013  . Primary lactic acidemia 05/30/2013  . Osteoarthritis of both knees 05/30/2013  . Acute respiratory failure with hypoxia 05/30/2013  .  Other and unspecified hyperlipidemia 04/06/2013  . Anemia 10/27/2012  . Acute bronchitis 10/26/2012  . Leukopenia 10/26/2012  . Smoker 10/26/2012  . Hyperglycemia, drug-induced 10/26/2012  . Muscle weakness (generalized) 08/14/2012  . Unstable shoulder hemiarthroplasty 08/14/2012  . Anal warts 07/08/2012  . Pruritus 07/08/2012  . Urinary incontinence 07/01/2012  . 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 Abnormal EKG 03/28/2012  . Vulvovaginitis due to Candida 12/31/2011  . Shoulder pain, bilateral 11/14/2011  . Chest pain 09/26/2011  . Dysuria 07/27/2011  . Functional gait disorder 07/04/2011  . Abnormal thyroid blood test 07/04/2011  . Elevated LFTs 07/03/2011  . Anxiety 07/03/2011  . Bipolar 1 disorder 07/03/2011  . Drug induced acute pancreatitis 07/02/2011  . Bilateral knee pain 07/02/2011  . Avascular necrosis of femur head 07/02/2011  . Chronic pancreatitis 07/02/2011  . HTN (hypertension) 07/02/2011  . GERD (gastroesophageal reflux disease) 07/02/2011  . Pain in joint, lower leg 05/01/2011  . Stiffness of joint, not elsewhere classified, lower leg 05/01/2011  . Difficulty in walking(719.7) 05/01/2011  . Bronchitis, acute 03/29/2011  . ANKLE SPRAIN 09/26/2010  . SINUSITIS, ACUTE 03/06/2010  . CONSTIPATION, INTERMITTENT 12/19/2009  . ATYPICAL MYCOBACTERIAL INFECTION 11/16/2009  . Obesity, unspecified 10/11/2009  . IMPAIRED GLUCOSE TOLERANCE 08/11/2009  . FATIGUE 08/11/2009  . OTHER CHRONIC SINUSITIS 06/30/2009  . BACK PAIN WITH RADICULOPATHY 05/16/2009  . PHARYNGITIS 04/04/2009  . ALLERGIC RHINITIS DUE TO OTHER ALLERGEN 02/21/2009  . GYNECOMASTIA 02/21/2009  . HEADACHE 02/21/2009  . URINARY INCONTINENCE 02/21/2009  . DEPRESSION 01/10/2009  .  ACUTE CYSTITIS 10/05/2008  . NECK PAIN, LEFT 06/22/2008  . NAUSEA 06/22/2008  . HIP PAIN, LEFT 03/03/2007  . ABDOMINAL PAIN 03/03/2007  . ABNORMAL PAP SMEAR, LGSIL 12/03/2006  .  HERPES ZOSTER 08/31/2006  . Unspecified essential hypertension 08/31/2006  . HEMORRHOIDS 08/31/2006  . ASTHMA 08/31/2006  . GERD 08/31/2006  . BAKER'S CYST 08/31/2006  . PANCREATITIS, HX OF 02/19/2001  . Human immunodeficiency virus (HIV) disease 10/23/1999       GP    Jedi Catalfamo,CINDY 06/16/2014, 1:10 PM

## 2014-06-18 ENCOUNTER — Ambulatory Visit (HOSPITAL_COMMUNITY): Payer: Medicare Other | Admitting: Physical Therapy

## 2014-06-18 ENCOUNTER — Ambulatory Visit (HOSPITAL_COMMUNITY)
Admission: RE | Admit: 2014-06-18 | Discharge: 2014-06-18 | Disposition: A | Discharge status: Home / Self Care | Payer: Medicare Other | Source: Ambulatory Visit | Attending: Orthopedic Surgery | Admitting: Orthopedic Surgery

## 2014-06-18 ENCOUNTER — Other Ambulatory Visit: Payer: Self-pay | Admitting: Licensed Clinical Social Worker

## 2014-06-18 DIAGNOSIS — IMO0001 Reserved for inherently not codable concepts without codable children: Secondary | ICD-10-CM | POA: Diagnosis not present

## 2014-06-18 DIAGNOSIS — R29898 Other symptoms and signs involving the musculoskeletal system: Secondary | ICD-10-CM

## 2014-06-18 MED ORDER — RITONAVIR 100 MG PO CAPS: 100.0000 mg | ORAL_CAPSULE | Freq: Every day | ORAL | Status: DC

## 2014-06-18 MED ORDER — EMTRICITABINE-TENOFOVIR DF 200-300 MG PO TABS: 1.0000 | ORAL_TABLET | Freq: Every day | ORAL | Status: DC

## 2014-06-18 NOTE — Progress Notes (Signed)
Physical Therapy Treatment Patient Details  Name: [name] MRN: [id number] Date of Birth: 01/28/1961  Today's Date: 06/18/2014 Time: 1020-1110 PT Time Calculation (min): 50 min Charge: TE 1020-1100, Ice 1100-1110   Visit#: 4 of 12  Re-eval: 06/23/14 Assessment Diagnosis: Lt TKR Surgical Date: 03/16/04 Prior Therapy: SNF, HH  Authorization: [facility] MEDICare  Authorization Time Period:    Authorization Visit#:   of     Subjective: Symptoms/Limitations Symptoms: Pt entered dept stateing increased pain today, reports she has taken her medicine for BR and muscle relaxor.  Has not made apt with MD yet. Pain Assessment Currently in Pain?: Yes Pain Score: 9  Pain Location: Knee Pain Orientation: Left  Precautions/Restrictions  Precautions Precautions: Fall  Exercise/Treatments Standing Heel Raises: 15 reps;Limitations Heel Raises Limitations: Toe raises Knee Flexion: Left;Limitations Knee Flexion Limitations: 3# Functional Squat: 10 reps;Limitations Functional Squat Limitations: 3D hip excursion Gait Training: for step through gt with walker x 246ft Seated Long Arc Quad: 15 reps;Weights Long Arc Quad Weight: 3 lbs.  Modalities Modalities: Cryotherapy Cryotherapy Number Minutes Cryotherapy: 10 Minutes Cryotherapy Location: Knee Type of Cryotherapy: Ice pack  Physical Therapy Assessment and Plan PT Assessment and Plan Clinical Impression Statement: O2 sat fluctuates whike standing from 75-95%, able to return above 90% quickly with seated rest breaks. BP 140/91.  Pt stated she has taken BP meds and muscle relaxor prior therapy.  Pt c/o dizziness while standing as well.  Rest breaks taken between each standing exercise.  Pt encouraged to contact her general MD concerning her vital issues.  Session focus on improving gait mechanics for step thru pattern and improving stance phase, began 3D hip excursion to improving weight distribution and gluteal strengthening.  Ice  applied at end of session for pain control.  Pt reported pain reduced at end of session with improved gait mechanics following cueing.   PT Plan: Continue to work on gt, balance and strength.     Goals PT Short Term Goals PT Short Term Goal 1: Pt pain to be no higher than a 7/10 80% of the day PT Short Term Goal 2: Pt to be able to walik heel toe with the walker inside her home  PT Short Term Goal 2 - Progress: Progressing toward goal PT Short Term Goal 3: Pt  to be ale to stand for five minutes without difficulty to be able to complete selfgrooming while standing PT Short Term Goal 3 - Progress: Progressing toward goal PT Short Term Goal 4: Pt strength increased 1/2 grade to allow the above to occur PT Short Term Goal 4 - Progress: Progressing toward goal PT Long Term Goals PT Long Term Goal 1: I in advance HEP  PT Long Term Goal 2: Pain to be no greater than a 5/10 80% of the day. Long Term Goal 3: Pt to be walking inside with a cane with a normal gait pattern.  Long Term Goal 3 Progress: Progressing toward goal Long Term Goal 4: Pt to be able to come sit to stand with ease. Long Term Goal 4 Progress: Progressing toward goal PT Long Term Goal 5: Pt to be able to stand for ten minutes in order to make a sandwich without difficulty   Problem List Patient Active Problem List   Diagnosis Date Noted  . Left leg weakness 05/24/2014  . Edema 04/13/2014  . COPD (chronic obstructive pulmonary disease) 04/13/2014  . S/P knee replacement 03/30/2014  . Alcohol withdrawal delirium 03/22/2014  . Osteoarthritis of left knee 03/16/2014  .  Knee osteoarthritis 03/16/2014  . Hypotension, unspecified 05/30/2013  . Dehydration 05/30/2013  . Primary lactic acidemia 05/30/2013  . Osteoarthritis of both knees 05/30/2013  . Acute respiratory failure with hypoxia 05/30/2013  . Other and unspecified hyperlipidemia 04/06/2013  . Anemia 10/27/2012  . Acute bronchitis 10/26/2012  . Leukopenia 10/26/2012   . Smoker 10/26/2012  . Hyperglycemia, drug-induced 10/26/2012  . Muscle weakness (generalized) 08/14/2012  . Unstable shoulder hemiarthroplasty 08/14/2012  . Anal warts 07/08/2012  . Pruritus 07/08/2012  . Urinary incontinence 07/01/2012  . 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 Abnormal EKG 03/28/2012  . Vulvovaginitis due to Candida 12/31/2011  . Shoulder pain, bilateral 11/14/2011  . Chest pain 09/26/2011  . Dysuria 07/27/2011  . Functional gait disorder 07/04/2011  . Abnormal thyroid blood test 07/04/2011  . Elevated LFTs 07/03/2011  . Anxiety 07/03/2011  . Bipolar 1 disorder 07/03/2011  . Drug induced acute pancreatitis 07/02/2011  . Bilateral knee pain 07/02/2011  . Avascular necrosis of femur head 07/02/2011  . Chronic pancreatitis 07/02/2011  . HTN (hypertension) 07/02/2011  . GERD (gastroesophageal reflux disease) 07/02/2011  . Pain in joint, lower leg 05/01/2011  . Stiffness of joint, not elsewhere classified, lower leg 05/01/2011  . Difficulty in walking(719.7) 05/01/2011  . Bronchitis, acute 03/29/2011  . ANKLE SPRAIN 09/26/2010  . SINUSITIS, ACUTE 03/06/2010  . CONSTIPATION, INTERMITTENT 12/19/2009  . ATYPICAL MYCOBACTERIAL INFECTION 11/16/2009  . Obesity, unspecified 10/11/2009  . IMPAIRED GLUCOSE TOLERANCE 08/11/2009  . FATIGUE 08/11/2009  . OTHER CHRONIC SINUSITIS 06/30/2009  . BACK PAIN WITH RADICULOPATHY 05/16/2009  . PHARYNGITIS 04/04/2009  . ALLERGIC RHINITIS DUE TO OTHER ALLERGEN 02/21/2009  . GYNECOMASTIA 02/21/2009  . HEADACHE 02/21/2009  . URINARY INCONTINENCE 02/21/2009  . DEPRESSION 01/10/2009  . ACUTE CYSTITIS 10/05/2008  . NECK PAIN, LEFT 06/22/2008  . NAUSEA 06/22/2008  . HIP PAIN, LEFT 03/03/2007  . ABDOMINAL PAIN 03/03/2007  . ABNORMAL PAP SMEAR, LGSIL 12/03/2006  . HERPES ZOSTER 08/31/2006  . Unspecified essential hypertension 08/31/2006  . HEMORRHOIDS 08/31/2006  . ASTHMA 08/31/2006  .  GERD 08/31/2006  . BAKER'S CYST 08/31/2006  . PANCREATITIS, HX OF 02/19/2001  . Human immunodeficiency virus (HIV) disease 10/23/1999    PT - End of Session Equipment Utilized During Treatment: Gait belt Activity Tolerance: Patient limited by pain;Patient tolerated treatment well General Behavior During Therapy: [facility] for tasks assessed/performed  GP    Aldona Lento 06/18/2014, 12:18 PM

## 2014-06-21 ENCOUNTER — Ambulatory Visit (HOSPITAL_COMMUNITY)
Admission: RE | Admit: 2014-06-21 | Payer: Medicare Other | Source: Ambulatory Visit | Attending: Orthopedic Surgery | Admitting: Orthopedic Surgery

## 2014-06-22 ENCOUNTER — Other Ambulatory Visit: Payer: Medicare Other

## 2014-06-22 DIAGNOSIS — B20 Human immunodeficiency virus [HIV] disease: Secondary | ICD-10-CM

## 2014-06-22 DIAGNOSIS — Z79899 Other long term (current) drug therapy: Secondary | ICD-10-CM

## 2014-06-22 DIAGNOSIS — Z113 Encounter for screening for infections with a predominantly sexual mode of transmission: Secondary | ICD-10-CM

## 2014-06-22 LAB — CBC WITH DIFFERENTIAL/PLATELET
BASOS ABS: 0 10*3/uL (ref 0.0–0.1)
Basophils Relative: 0 % (ref 0–1)
Eosinophils Absolute: 0.2 10*3/uL (ref 0.0–0.7)
Eosinophils Relative: 2 % (ref 0–5)
HCT: 40.9 % (ref 36.0–46.0)
HEMOGLOBIN: 13.1 g/dL (ref 12.0–15.0)
Lymphocytes Relative: 62 % — ABNORMAL HIGH (ref 12–46)
Lymphs Abs: 5.8 10*3/uL — ABNORMAL HIGH (ref 0.7–4.0)
MCH: 26.6 pg (ref 26.0–34.0)
MCHC: 32 g/dL (ref 30.0–36.0)
MCV: 83.1 fL (ref 78.0–100.0)
MONOS PCT: 8 % (ref 3–12)
Monocytes Absolute: 0.7 10*3/uL (ref 0.1–1.0)
NEUTROS ABS: 2.6 10*3/uL (ref 1.7–7.7)
Neutrophils Relative %: 28 % — ABNORMAL LOW (ref 43–77)
PLATELETS: 281 10*3/uL (ref 150–400)
RBC: 4.92 MIL/uL (ref 3.87–5.11)
RDW: 17.6 % — ABNORMAL HIGH (ref 11.5–15.5)
WBC: 9.3 10*3/uL (ref 4.0–10.5)

## 2014-06-22 LAB — LIPID PANEL
CHOL/HDL RATIO: 2.4 ratio
Cholesterol: 148 mg/dL (ref 0–200)
HDL: 62 mg/dL (ref >39)
LDL Cholesterol: 64 mg/dL (ref 0–99)
Triglycerides: 111 mg/dL (ref <150)
VLDL: 22 mg/dL (ref 0–40)

## 2014-06-22 LAB — COMPLETE METABOLIC PANEL WITH GFR
ALT: 9 U/L (ref 0–35)
AST: 21 U/L (ref 0–37)
Albumin: 3.6 g/dL (ref 3.5–5.2)
Alkaline Phosphatase: 145 U/L — ABNORMAL HIGH (ref 39–117)
BILIRUBIN TOTAL: 0.4 mg/dL (ref 0.2–1.2)
BUN: 6 mg/dL (ref 6–23)
CALCIUM: 9 mg/dL (ref 8.4–10.5)
CHLORIDE: 97 meq/L (ref 96–112)
CO2: 24 meq/L (ref 19–32)
CREATININE: 0.63 mg/dL (ref 0.50–1.10)
GFR, Est African American: >89 mL/min
GLUCOSE: 117 mg/dL — AB (ref 70–99)
Potassium: 3.9 mEq/L (ref 3.5–5.3)
Sodium: 136 mEq/L (ref 135–145)
Total Protein: 6.9 g/dL (ref 6.0–8.3)

## 2014-06-22 LAB — RPR

## 2014-06-23 ENCOUNTER — Telehealth (HOSPITAL_COMMUNITY): Payer: Self-pay

## 2014-06-23 ENCOUNTER — Ambulatory Visit (HOSPITAL_COMMUNITY): Payer: Medicare Other | Admitting: Physical Therapy

## 2014-06-23 LAB — T-HELPER CELL (CD4) - (RCID CLINIC ONLY)
CD4 % Helper T Cell: 22 % — ABNORMAL LOW (ref 33–55)
CD4 T Cell Abs: 1330 /uL (ref 400–2700)

## 2014-06-24 LAB — HIV-1 RNA QUANT-NO REFLEX-BLD
HIV 1 RNA Quant: 37 copies/mL — ABNORMAL HIGH (ref <20)
HIV-1 RNA Quant, Log: 1.57 {Log} — ABNORMAL HIGH (ref <1.30)

## 2014-06-25 ENCOUNTER — Ambulatory Visit (HOSPITAL_COMMUNITY)
Admission: RE | Admit: 2014-06-25 | Payer: Medicare Other | Source: Ambulatory Visit | Attending: Orthopedic Surgery | Admitting: Orthopedic Surgery

## 2014-06-29 ENCOUNTER — Other Ambulatory Visit: Payer: Self-pay | Admitting: Licensed Clinical Social Worker

## 2014-06-29 MED ORDER — EMTRICITABINE-TENOFOVIR DF 200-300 MG PO TABS: 1.0000 | ORAL_TABLET | Freq: Every day | ORAL | Status: DC

## 2014-06-29 MED ORDER — RITONAVIR 100 MG PO CAPS: 100.0000 mg | ORAL_CAPSULE | Freq: Every day | ORAL | Status: DC

## 2014-06-30 ENCOUNTER — Ambulatory Visit (HOSPITAL_COMMUNITY): Payer: Medicare Other | Admitting: Physical Therapy

## 2014-07-02 ENCOUNTER — Inpatient Hospital Stay (HOSPITAL_COMMUNITY): Admission: RE | Admit: 2014-07-02 | Payer: Medicare Other | Source: Ambulatory Visit

## 2014-07-02 ENCOUNTER — Telehealth (HOSPITAL_COMMUNITY): Payer: Self-pay | Admitting: Internal Medicine

## 2014-07-05 ENCOUNTER — Ambulatory Visit (HOSPITAL_COMMUNITY): Payer: Medicare Other

## 2014-07-07 ENCOUNTER — Ambulatory Visit (HOSPITAL_COMMUNITY): Payer: Medicare Other | Attending: Orthopedic Surgery | Admitting: Physical Therapy

## 2014-07-07 DIAGNOSIS — IMO0001 Reserved for inherently not codable concepts without codable children: Secondary | ICD-10-CM | POA: Insufficient documentation

## 2014-07-08 ENCOUNTER — Ambulatory Visit (HOSPITAL_COMMUNITY): Payer: Medicare Other | Admitting: Physical Therapy

## 2014-07-09 ENCOUNTER — Ambulatory Visit (HOSPITAL_COMMUNITY)
Admission: RE | Admit: 2014-07-09 | Payer: Medicare Other | Source: Ambulatory Visit | Attending: Orthopedic Surgery | Admitting: Orthopedic Surgery

## 2014-07-12 ENCOUNTER — Ambulatory Visit (HOSPITAL_COMMUNITY): Payer: Medicare Other | Admitting: Physical Therapy

## 2014-07-12 ENCOUNTER — Telehealth (HOSPITAL_COMMUNITY): Payer: Self-pay | Admitting: Physical Therapy

## 2014-07-14 ENCOUNTER — Ambulatory Visit (HOSPITAL_COMMUNITY): Payer: Medicare Other

## 2014-07-16 ENCOUNTER — Ambulatory Visit (HOSPITAL_COMMUNITY): Payer: Medicare Other

## 2014-07-19 ENCOUNTER — Ambulatory Visit (HOSPITAL_COMMUNITY): Payer: Medicare Other | Admitting: Physical Therapy

## 2014-07-20 ENCOUNTER — Encounter: Payer: Self-pay | Admitting: *Deleted

## 2014-07-21 ENCOUNTER — Ambulatory Visit (HOSPITAL_COMMUNITY): Payer: Medicare Other | Admitting: Physical Therapy

## 2014-07-21 ENCOUNTER — Ambulatory Visit (INDEPENDENT_AMBULATORY_CARE_PROVIDER_SITE_OTHER): Payer: Medicare Other | Admitting: Obstetrics and Gynecology

## 2014-07-21 ENCOUNTER — Encounter: Payer: Self-pay | Admitting: Obstetrics and Gynecology

## 2014-07-21 VITALS — BP 120/70 | Ht 64.0 in | Wt 171.0 lb

## 2014-07-21 DIAGNOSIS — L293 Anogenital pruritus, unspecified: Secondary | ICD-10-CM

## 2014-07-21 DIAGNOSIS — N9489 Other specified conditions associated with female genital organs and menstrual cycle: Secondary | ICD-10-CM

## 2014-07-21 DIAGNOSIS — N362 Urethral caruncle: Secondary | ICD-10-CM

## 2014-07-21 MED ORDER — FLUCONAZOLE 150 MG PO TABS: 150.0000 mg | ORAL_TABLET | Freq: Once | ORAL | Status: DC

## 2014-07-21 NOTE — Progress Notes (Signed)
[facility] Visit  Patient name: [name] MRN [id number]  Date of birth: 1961-07-14  CC & HPI:  [name] is a 52 y.o. female presenting today for Pt here for vaginal itching, pressure when voiding, and urge to go. Pt states that she has had these symptoms for a week, and the pressure on and off. She states that she was 9 in 1982 when she had a hysterectomy at [facility] because of [place], she doesn't know what type it was. She states that they didn't leave her ovaries. She states that she has had 3 female surgeries. She denies any other associated symptoms. She states that she had a total knee replacement not too long ago. She states that she is sexually active and her and her partner use condoms. Generalized vulvar itching   ROS:  +Vaginal itching + Urgency No other complaints  Pertinent History Reviewed:   Reviewed: Significant for  Medical         Past Medical History  Diagnosis Date  . Baker's cyst   . Pulmonary lesion 1/03    on RUL onn CT   . Hemorrhoids   . Zoster 2001  . Family history of diabetes mellitus   . Tobacco user   . Other abnormal Papanicolaou smear of cervix and cervical HPV(795.09)   . Herpes zoster   . Hemorrhoids   . Baker's cyst   . Pancreatitis   . HIV disease     take Prezista,Truvada,Norvir,and Isentress daily  . Bipolar disorder     With hx of psychotic features.  . Articular cartilage disorder of knee   . Bursitis of shoulder   . Rotator cuff arthropathy   . Hypertension     takes Clonidine and Verapamil daily  . Emphysema   . Asthma     Albuterol resue inhaler and QVAR daily  . Shortness of breath     with exertion and stress  . Dizziness   . Joint pain   . Joint swelling   . Chronic back pain     buldging disc  . Bruises easily   . Gastric ulcer   . GERD (gastroesophageal reflux disease)     takes Nexium daily  . Chronic constipation   . Urinary frequency   . Urinary incontinence   . Nocturia   .  History of blood transfusion at age 89  . Early cataracts, bilateral   . Anxiety   . Depression     takes Xanax daily and Depakote bid  . History of shingles   . 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 Tobacco abuse 10/26/2012  . Chronic back pain   . Chronic knee pain   . Complication of anesthesia     AGGITATED  . Uterine cancer     "had some kind of cancer in my teens; had to have TAH to remove it"  . Headache(784.0)     "often here lately; weather changing"  . Pneumonia     "several times"  . Arthritis     "all over" (03/16/2014)  . Osteoarthritis of left knee 03/16/2014  . Alcohol withdrawal delirium 03/22/2014                              Surgical Hx:    Past Surgical History  Procedure Laterality Date  . Breast lumpectomy Right   . Breast biopsy Right ~ 1980  .  Cholecystectomy  10+yrs ago  . Knee arthroscopy Bilateral   . Finger surgery Right     rt middle finger with pin in it  . Epidural injections    . Esophagogastroduodenoscopy    . Shoulder hemi-arthroplasty  05/06/2012    Procedure: SHOULDER HEMI-ARTHROPLASTY;  Surgeon: Johnny Bridge, MD;  Location: [facility];  Service: Orthopedics;  Laterality: Right;  . Joint replacement    . Fracture surgery      Right finger third digit  . Total knee arthroplasty Left 03/16/2014  . Appendectomy  1981    with hysterectomy  . Total abdominal hysterectomy  1981    w/BSO  . Total knee arthroplasty Left 03/16/2014    Procedure: LEFT TOTAL KNEE ARTHROPLASTY;  Surgeon: Johnny Bridge, MD;  Location: [facility];  Service: Orthopedics;  Laterality: Left;   Medications: Reviewed & Updated - see associated section                      Current outpatient prescriptions:albuterol (PROVENTIL HFA;VENTOLIN HFA) 108 (90 BASE) MCG/ACT inhaler, Inhale 2 puffs into the lungs every 4 (four) hours as needed for shortness of breath. , Disp: , Rfl: ;  albuterol (PROVENTIL) (2.5 MG/3ML) 0.083% nebulizer solution, Take  2.5 mg by nebulization every 6 (six) hours as needed for wheezing or shortness of breath., Disp: , Rfl:  ALPRAZolam (XANAX) 1 MG tablet, Take one tablet by mouth twice daily as needed for anxiety, Disp: 60 tablet, Rfl: 5;  beclomethasone (QVAR) 40 MCG/ACT inhaler, Inhale 2 puffs into the lungs 2 (two) times daily., Disp: , Rfl: ;  bisacodyl (DULCOLAX) 5 MG EC tablet, Take 1 tablet (5 mg total) by mouth daily as needed for moderate constipation., Disp: 30 tablet, Rfl: 0;  cetirizine (ZYRTEC) 10 MG tablet, Take 10 mg by mouth at bedtime. , Disp: , Rfl:  cloNIDine (CATAPRES) 0.2 MG tablet, Take 0.1 mg by mouth at bedtime., Disp: , Rfl: ;  cyanocobalamin (,VITAMIN B-12,) 1000 MCG/ML injection, Inject 1,000 mcg into the muscle every 30 (thirty) days., Disp: , Rfl: ;  Darunavir Ethanolate (PREZISTA) 800 MG tablet, Take 800 mg by mouth daily with breakfast., Disp: , Rfl:  diphenoxylate-atropine (LOMOTIL) 2.5-0.025 MG per tablet, Take 1 tablet by mouth 4 (four) times daily as needed for diarrhea or loose stools., Disp: 120 tablet, Rfl: 0;  emtricitabine-tenofovir (TRUVADA) 200-300 MG per tablet, Take 1 tablet by mouth daily., Disp: 30 tablet, Rfl: 3;  esomeprazole (NEXIUM) 40 MG capsule, Take 40 mg by mouth daily before breakfast., Disp: , Rfl:  folic acid (FOLVITE) 1 MG tablet, Take 1 mg by mouth daily., Disp: , Rfl: ;  methocarbamol (ROBAXIN) 750 MG tablet, Take 1 tablet (750 mg total) by mouth every 6 (six) hours as needed for muscle spasms., Disp: 75 tablet, Rfl: 1;  naphazoline-pheniramine (NAPHCON-A) 0.025-0.3 % ophthalmic solution, Place 1 drop into both eyes 4 (four) times daily as needed for irritation., Disp: , Rfl:  ondansetron (ZOFRAN) 4 MG tablet, Take 4 mg by mouth every 8 (eight) hours as needed for nausea or vomiting., Disp: , Rfl: ;  oxybutynin (DITROPAN-XL) 10 MG 24 hr tablet, Take 10 mg by mouth 2 (two) times daily., Disp: , Rfl: ;  oxyCODONE-acetaminophen (PERCOCET) 10-325 MG per tablet, Take one or  two tablets by mouth every 6 hours as needed for pain, Disp: 120 tablet, Rfl: 0 potassium chloride (K-DUR) 10 MEQ tablet, Take 10 mEq by mouth daily., Disp: , Rfl: ;  promethazine (PHENERGAN)  25 MG tablet, Take 25 mg by mouth every 6 (six) hours as needed for nausea., Disp: , Rfl: ;  raltegravir (ISENTRESS) 400 MG tablet, Take 1 tablet (400 mg total) by mouth 2 (two) times daily., Disp: 60 tablet, Rfl: 0;  ritonavir (NORVIR) 100 MG capsule, Take 1 capsule (100 mg total) by mouth daily with breakfast., Disp: 30 capsule, Rfl: 3 verapamil (CALAN-SR) 240 MG CR tablet, Take 240 mg by mouth daily., Disp: , Rfl: ;  Vitamin D, Ergocalciferol, (DRISDOL) 50000 UNITS CAPS, Take 50,000 Units by mouth every 7 (seven) days. Take on mondays, Disp: , Rfl: ;  zolpidem (AMBIEN) 5 MG tablet, Take one tablet by mouth at bedtime as needed for sleep, Disp: 30 tablet, Rfl: 0;  Azelastine HCl 0.15 % SOLN, Place 1 spray into the nose daily as needed (for allergies). , Disp: , Rfl:  Phenazopyridine HCl (AZO TABS PO), Take 2 tablets by mouth daily as needed (urinary symptoms)., Disp: , Rfl: ;  [DISCONTINUED] QUEtiapine (SEROQUEL) 100 MG tablet, Take 1 tablet (100 mg total) by mouth at bedtime. Take one tablet by mouth at bedtime, Disp: , Rfl:    Social History: Reviewed -  reports that she has been smoking Cigarettes.  She has a 64 pack-year smoking history. She has never used smokeless tobacco.  Objective Findings:  Vitals: Blood pressure 120/70, height 5\' 4"  (1.626 m), weight 171 lb (77.565 kg).  Physical Examination:  Pelvic - normal external genitalia, vulva, vagina, cervix, uterus and adnexa,  VULVA: normal appearing vulva with no masses, tenderness or lesions,  VAGINA: normal appearing vagina with normal color and discharge, no lesions,  CERVIX: normal appearing cervix without discharge or lesions,  UTERUS: uterus is normal size, shape, consistency and nontender,  ADNEXA: normal adnexa in size, nontender and no  masses Musculoskeletal - joint tenderness in the left leg due to total knee replacement. Using Walker with difficulty GU: urethral caruncle present       efg otherwise normal    Bimanual no masses, bowel present     Assessment & Plan:   A:  1. Normal gyn exam s.p hyst  At early age (19) for ?cancer?. 2. STD check done  Results by phone 3. Vulvar pruritis without etiology 4. Urethral caruncle   P:  1. Empiric tx diflucan.      This chart was scribed for Jonnie Kind, MD by Steva Colder, ED Scribe. The patient was seen in room 2 at 12:17 PM.

## 2014-07-21 NOTE — Progress Notes (Signed)
Patient ID: [name], female   DOB: Mar 16, 1961, 53 y.o.   MRN: [id number] Pt here for vaginal itching, pressure when voiding, and urge to go. Pt states that she has had these symptoms for a week, and the pressure on and off.

## 2014-07-21 NOTE — Patient Instructions (Addendum)
Diflucan Rx sent to pharmacy. 

## 2014-07-22 ENCOUNTER — Ambulatory Visit (HOSPITAL_COMMUNITY): Payer: Medicare Other | Admitting: Physical Therapy

## 2014-07-22 LAB — GC/CHLAMYDIA PROBE AMP
CT PROBE, AMP APTIMA: NEGATIVE
GC Probe RNA: NEGATIVE

## 2014-07-27 ENCOUNTER — Ambulatory Visit: Payer: Medicare Other | Admitting: Infectious Diseases

## 2014-07-29 ENCOUNTER — Ambulatory Visit (HOSPITAL_COMMUNITY)
Admission: RE | Admit: 2014-07-29 | Discharge: 2014-07-29 | Disposition: A | Discharge status: Home / Self Care | Payer: Medicare Other | Source: Ambulatory Visit | Attending: Orthopedic Surgery | Admitting: Orthopedic Surgery

## 2014-07-29 DIAGNOSIS — I10 Essential (primary) hypertension: Secondary | ICD-10-CM | POA: Diagnosis not present

## 2014-07-29 DIAGNOSIS — Z96652 Presence of left artificial knee joint: Secondary | ICD-10-CM | POA: Diagnosis not present

## 2014-07-29 DIAGNOSIS — Z96659 Presence of unspecified artificial knee joint: Secondary | ICD-10-CM

## 2014-07-29 DIAGNOSIS — K219 Gastro-esophageal reflux disease without esophagitis: Secondary | ICD-10-CM | POA: Insufficient documentation

## 2014-07-29 DIAGNOSIS — Z471 Aftercare following joint replacement surgery: Secondary | ICD-10-CM | POA: Diagnosis not present

## 2014-07-29 DIAGNOSIS — R2689 Other abnormalities of gait and mobility: Secondary | ICD-10-CM

## 2014-07-29 DIAGNOSIS — R29898 Other symptoms and signs involving the musculoskeletal system: Secondary | ICD-10-CM | POA: Diagnosis present

## 2014-07-29 DIAGNOSIS — E785 Hyperlipidemia, unspecified: Secondary | ICD-10-CM | POA: Diagnosis not present

## 2014-07-29 DIAGNOSIS — M170 Bilateral primary osteoarthritis of knee: Secondary | ICD-10-CM | POA: Insufficient documentation

## 2014-07-29 NOTE — Evaluation (Signed)
Physical Therapy Evaluation  Patient Details  Name: [name] MRN: [id number] Date of Birth: 23-Dec-1960  Today's Date: 07/29/2014 Time: 1110-1145 PT Time Calculation (min): 35 min Charge:  Evaluation 1110-1145             Visit#: 1 of 12  Re-eval: 08/28/14 Assessment Diagnosis: Lt TKR Surgical Date: 03/16/04 Prior Therapy: SNF, HH  Authorization: [facility] MEDIC[employer]    Past Medical History:  Past Medical History  Diagnosis Date  . Baker's cyst   . Pulmonary lesion 1/03    on RUL onn CT   . Hemorrhoids   . Zoster 2001  . Family history of diabetes mellitus   . Tobacco user   . Other abnormal Papanicolaou smear of cervix and cervical HPV(795.09)   . Herpes zoster   . Hemorrhoids   . Baker's cyst   . Pancreatitis   . HIV disease     take Prezista,Truvada,Norvir,and Isentress daily  . Bipolar disorder     With hx of psychotic features.  . Articular cartilage disorder of knee   . Bursitis of shoulder   . Rotator cuff arthropathy   . Hypertension     takes Clonidine and Verapamil daily  . Emphysema   . Asthma     Albuterol resue inhaler and QVAR daily  . Shortness of breath     with exertion and stress  . Dizziness   . Joint pain   . Joint swelling   . Chronic back pain     buldging disc  . Bruises easily   . Gastric ulcer   . GERD (gastroesophageal reflux disease)     takes Nexium daily  . Chronic constipation   . Urinary frequency   . Urinary incontinence   . Nocturia   . History of blood transfusion at age 55  . Early cataracts, bilateral   . Anxiety   . Depression     takes Xanax daily and Depakote bid  . History of shingles   . 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 Tobacco abuse 10/26/2012  . Chronic back pain   . Chronic knee pain   . Complication of anesthesia     AGGITATED  . Uterine cancer     "had some kind of cancer in my teens; had to have TAH to remove it"  . Headache(784.0)     "often here lately;  weather changing"  . Pneumonia     "several times"  . Arthritis     "all over" (03/16/2014)  . Osteoarthritis of left knee 03/16/2014  . Alcohol withdrawal delirium 03/22/2014   Past Surgical History:  Past Surgical History  Procedure Laterality Date  . Breast lumpectomy Right   . Breast biopsy Right ~ 1980  . Cholecystectomy  10+yrs ago  . Knee arthroscopy Bilateral   . Finger surgery Right     rt middle finger with pin in it  . Epidural injections    . Esophagogastroduodenoscopy    . Shoulder hemi-arthroplasty  05/06/2012    Procedure: SHOULDER HEMI-ARTHROPLASTY;  Surgeon: Johnny Bridge, MD;  Location: [facility];  Service: Orthopedics;  Laterality: Right;  . Joint replacement    . Fracture surgery      Right finger third digit  . Total knee arthroplasty Left 03/16/2014  . Appendectomy  1981    with hysterectomy  . Total abdominal hysterectomy  1981    w/BSO  . Total knee arthroplasty Left 03/16/2014    Procedure: LEFT TOTAL KNEE ARTHROPLASTY;  Surgeon: Johnny Bridge, MD;  Location: [facility];  Service: Orthopedics;  Laterality: Left;    Subjective Symptoms/Limitations Symptoms: [name] states that she was having difficulty with her breathing and she had to wait until she got her medication to a point where she was able to breathe better.  She states she has been trying to walk and do some exercises at home but states that she spends most of her day in the bed at home.  Pertinent History: [name] has had B knee pain for years she opted to have a TKR on her Lt knee 5/262015.  She was discharged to a SNF on 03/22/2014 and remained there until 04/13/2014.  She recieved [facility] until 05/14/14 and was referred for OP therapy in August at which time she was not walking and was in a wheelchair secondary to pain. She attended 4 visits and did not return to threapu. She is  now being referred to out-patient therapy a second time to try to increase her functional ablitity. How long can you sit  comfortably?: Pt is limited in sitting due to back pain she states that she only sits in her car going places.  She is mainly in her bed and in a recliner.  How long can you stand comfortably?: less than five minutes  How long can you walk comfortably?: Pt is walking with a walker less than five minutes  Patient Stated Goals: I need to walk; do normal stuff like go into the kitchen and make a sandwich. Pain Assessment Currently in Pain?: Yes Pain Score: 8  Pain Location: Knee Pain Orientation: Left Pain Type: Chronic pain Pain Onset: More than a month ago Pain Frequency: Constant  Precautions/Restrictions  Precautions Precautions: Fall  Balance Screening Balance Screen Has the patient fallen in the past 6 months: Yes How many times?: 5 Has the patient had a decrease in activity level because of a fear of falling? : Yes Is the patient reluctant to leave their home because of a fear of falling? : Yes    Sensation/Coordination/Flexibility/Functional Tests Functional Tests Functional Tests: foto 18 was 1 on 05/24/2014  Assessment LLE AROM (degrees) Left Knee Extension: 7 (was 10 ) Left Knee Flexion: 120 (was 110) LLE Strength Left Hip Flexion: 2/5 (was 2-/5) Left Hip Extension: 2-/5 Left Hip ABduction: 2-/5 Left Knee Flexion: 3/5 (was 2+/5) Left Knee Extension:  (4-/5) Left Ankle Dorsiflexion: 3+/5  Exercise/Treatments  Supine Bridges: 10 reps Straight Leg Raises: 5 reps Other Supine Knee Exercises: abduction x 10   Prone  Hamstring Curl: 10 reps Hip Extension: AAROM    Physical Therapy Assessment and Plan PT Assessment and Plan Clinical Impression Statement: Pt is a 53 yo female who had a Lt TKR in May.  She had been referred to therapy in late August but was not very successful due to pt SOB and O2 dropping with any activity.  The patient quit coming to therapy and was placed on new medication by her primary physician that has helped her oxygen rate.  The patient is  very anxious to get better but states she spends most of her time in bed.  Therapist and patient discussed that wanting to get better and spending most the day in bed to not complement one another and the importance of her only going to bed at night to sleep.  Evaluation demonstrates decreased activity tolerance, decrease strength and decreased balance.  Ms Mckiver will benefit from skilled PT to address these issues and maximize  the patientts functioning ability.   Pt will benefit from skilled therapeutic intervention in order to improve on the following deficits: Decreased balance;Decreased activity tolerance;Decreased strength;Pain;Difficulty walking;Decreased mobility Rehab Potential: Fair PT Frequency: Min 3X/week PT Duration: 4 weeks PT Treatment/Interventions: Gait training;Therapeutic activities;Therapeutic exercise;Balance training;Patient/family education PT Plan: Continue to work on gt, balance and strength.     Goals Home Exercise Program Pt/caregiver will Perform Home Exercise Program: For increased strengthening PT Short Term Goals Time to Complete Short Term Goals: 2 weeks PT Short Term Goal 1: Pt pain to be no higher than a 7/10 80% of the day PT Short Term Goal 2: Pt to be able to walik heel toe with the walker inside her home for ten minutes  PT Short Term Goal 3: Pt  to be ale to stand for five minutes without difficulty to be able to complete selfgrooming while standing PT Short Term Goal 4: Pt strength increased 1/2 grade to allow the above to occur PT Short Term Goal 5: Pt to be out of the bed at least 50 % of the day  PT Long Term Goals Time to Complete Long Term Goals: 4 weeks PT Long Term Goal 1: I in advance HEP  PT Long Term Goal 2: Pain to be no greater than a 5/10 80% of the day. Long Term Goal 3: Pt to be walking inside with a cane with a normal gait pattern.  Long Term Goal 4: Pt to be able to come sit to stand with ease. PT Long Term Goal 5: Pt to be able to  stand for ten minutes in order to make a sandwich without difficulty  Additional PT Long Term Goals?: Yes PT Long Term Goal 6: Pt to be able to walk for 15 minutes with walker to improve quailtiy of life  PT Long Term Goal 7: Pt to be in bed only at nighttime to sleep   Problem List Patient Active Problem List   Diagnosis Date Noted  . Poor balance 07/29/2014  . Left leg weakness 05/24/2014  . Edema 04/13/2014  . COPD (chronic obstructive pulmonary disease) 04/13/2014  . S/P knee replacement 03/30/2014  . Alcohol withdrawal delirium 03/22/2014  . Osteoarthritis of left knee 03/16/2014  . Knee osteoarthritis 03/16/2014  . Hypotension, unspecified 05/30/2013  . Dehydration 05/30/2013  . Primary lactic acidemia 05/30/2013  . Osteoarthritis of both knees 05/30/2013  . Acute respiratory failure with hypoxia 05/30/2013  . Other and unspecified hyperlipidemia 04/06/2013  . Anemia 10/27/2012  . Acute bronchitis 10/26/2012  . Leukopenia 10/26/2012  . Smoker 10/26/2012  . Hyperglycemia, drug-induced 10/26/2012  . Muscle weakness (generalized) 08/14/2012  . Unstable shoulder hemiarthroplasty 08/14/2012  . Anal warts 07/08/2012  . Pruritus 07/08/2012  . Urinary incontinence 07/01/2012  . 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 Abnormal EKG 03/28/2012  . Vulvovaginitis due to Candida 12/31/2011  . Shoulder pain, bilateral 11/14/2011  . Chest pain 09/26/2011  . Dysuria 07/27/2011  . Functional gait disorder 07/04/2011  . Abnormal thyroid blood test 07/04/2011  . Elevated LFTs 07/03/2011  . Anxiety 07/03/2011  . Bipolar 1 disorder 07/03/2011  . Drug induced acute pancreatitis 07/02/2011  . Bilateral knee pain 07/02/2011  . Avascular necrosis of femur head 07/02/2011  . Chronic pancreatitis 07/02/2011  . HTN (hypertension) 07/02/2011  . GERD (gastroesophageal reflux disease) 07/02/2011  . Pain in joint, lower leg 05/01/2011  . Stiffness of  joint, not elsewhere classified, lower leg 05/01/2011  . Difficulty in walking(719.7)  05/01/2011  . Bronchitis, acute 03/29/2011  . ANKLE SPRAIN 09/26/2010  . SINUSITIS, ACUTE 03/06/2010  . CONSTIPATION, INTERMITTENT 12/19/2009  . ATYPICAL MYCOBACTERIAL INFECTION 11/16/2009  . Obesity, unspecified 10/11/2009  . IMPAIRED GLUCOSE TOLERANCE 08/11/2009  . FATIGUE 08/11/2009  . OTHER CHRONIC SINUSITIS 06/30/2009  . BACK PAIN WITH RADICULOPATHY 05/16/2009  . PHARYNGITIS 04/04/2009  . ALLERGIC RHINITIS DUE TO OTHER ALLERGEN 02/21/2009  . GYNECOMASTIA 02/21/2009  . HEADACHE 02/21/2009  . URINARY INCONTINENCE 02/21/2009  . DEPRESSION 01/10/2009  . ACUTE CYSTITIS 10/05/2008  . NECK PAIN, LEFT 06/22/2008  . NAUSEA 06/22/2008  . HIP PAIN, LEFT 03/03/2007  . ABDOMINAL PAIN 03/03/2007  . ABNORMAL PAP SMEAR, LGSIL 12/03/2006  . HERPES ZOSTER 08/31/2006  . Unspecified essential hypertension 08/31/2006  . HEMORRHOIDS 08/31/2006  . ASTHMA 08/31/2006  . GERD 08/31/2006  . BAKER'S CYST 08/31/2006  . PANCREATITIS, HX OF 02/19/2001  . Human immunodeficiency virus (HIV) disease 10/23/1999    PT Plan of Care PT Home Exercise Plan: given  GP Functional Assessment Tool Used: foto  Functional Limitation: Mobility: Walking and moving around Mobility: Walking and Moving Around Current Status (D7824): At least 80 percent but less than 100 percent impaired, limited or restricted Mobility: Walking and Moving Around Goal Status [phone]): At least 40 percent but less than 60 percent impaired, limited or restricted  Marwan Lipe,CINDY 07/29/2014, 3:05 PM  Physician Documentation Your signature is required to indicate approval of the treatment plan as stated above.  Please sign and either send electronically or make a copy of this report for your files and return this physician signed original.   Please mark one 1.__approve of plan  2. ___approve of plan with the following  conditions.   ______________________________                                                          _____________________ Physician Signature                                                                                                             Date

## 2014-08-03 ENCOUNTER — Inpatient Hospital Stay (HOSPITAL_COMMUNITY): Admission: RE | Admit: 2014-08-03 | Payer: Medicare Other | Source: Ambulatory Visit | Admitting: Physical Therapy

## 2014-08-05 ENCOUNTER — Ambulatory Visit (HOSPITAL_COMMUNITY)
Admission: RE | Admit: 2014-08-05 | Payer: Medicare Other | Source: Ambulatory Visit | Attending: Orthopedic Surgery | Admitting: Orthopedic Surgery

## 2014-08-09 ENCOUNTER — Telehealth: Payer: Self-pay | Admitting: *Deleted

## 2014-08-09 NOTE — Telephone Encounter (Signed)
Pt informed of negative GC/CHL results from 07/21/2014. Pt states continues to have a lot of vaginal pressure, call transferred to front staff for an appt to be made.

## 2014-08-10 ENCOUNTER — Ambulatory Visit (HOSPITAL_COMMUNITY): Payer: Medicare Other | Admitting: Physical Therapy

## 2014-08-12 ENCOUNTER — Inpatient Hospital Stay (HOSPITAL_COMMUNITY): Admission: RE | Admit: 2014-08-12 | Payer: Medicare Other | Source: Ambulatory Visit | Admitting: Physical Therapy

## 2014-08-17 ENCOUNTER — Ambulatory Visit (INDEPENDENT_AMBULATORY_CARE_PROVIDER_SITE_OTHER): Payer: Medicare Other | Admitting: Infectious Diseases

## 2014-08-17 ENCOUNTER — Encounter: Payer: Self-pay | Admitting: Infectious Diseases

## 2014-08-17 VITALS — BP 112/80 | HR 106 | Temp 97.7°F | Ht 64.0 in | Wt 169.0 lb

## 2014-08-17 DIAGNOSIS — B20 Human immunodeficiency virus [HIV] disease: Secondary | ICD-10-CM

## 2014-08-17 DIAGNOSIS — R112 Nausea with vomiting, unspecified: Secondary | ICD-10-CM | POA: Insufficient documentation

## 2014-08-17 MED ORDER — FLUCONAZOLE 100 MG PO TABS: 100.0000 mg | ORAL_TABLET | Freq: Every day | ORAL | Status: DC

## 2014-08-17 NOTE — Assessment & Plan Note (Signed)
She appears to be doing well. Her CD4 makes OI seem unlikely. She could have thrush, unlikely from the recent addition of spiriva (non-steroid agent), none seen in oropharynx.

## 2014-08-17 NOTE — Progress Notes (Signed)
   Subjective:    Patient ID: [name], female    DOB: 04/10/1961, 53 y.o.   MRN: [id number]  HPI 53 yo F with HIV+, R TKR + L TKR (03-16-14), bipolar. On DRVr/TRV/ISN. Has had n/v x1. Feeling dizzy, esp when rising. Has had f/c (didn't take temp). No diarrhea, has been constipated. Has taken phenergan, zofran with incomplete resolution. No sick exposures. No new rx except spiriva. Had sensation of food sticking in her throat, she can swallow liquids without difficulty. This has since resolved.  L knee is improving.  Still smoking.   HIV 1 RNA Quant (copies/mL)  Date Value  06/22/2014 37*  10/07/2013 <20   04/06/2013 <20      CD4 T Cell Abs (/uL)  Date Value  06/22/2014 1330   10/07/2013 220*  04/06/2013 790     Review of Systems  Constitutional: Positive for fever, chills and appetite change.  Gastrointestinal: Positive for nausea, vomiting and constipation. Negative for diarrhea.  Genitourinary: Positive for urgency. Negative for dysuria.       Objective:   Physical Exam  Constitutional: She appears well-developed and well-nourished.  HENT:  Mouth/Throat: No oropharyngeal exudate.  Eyes: EOM are normal. Pupils are equal, round, and reactive to light.  Neck: Neck supple.  Cardiovascular: Normal rate, regular rhythm and normal heart sounds.   Pulmonary/Chest: Effort normal and breath sounds normal.  Abdominal: Soft. Bowel sounds are normal. She exhibits no distension. There is no tenderness. There is no rebound and no guarding.  Musculoskeletal: She exhibits no edema.  Lymphadenopathy:    She has no cervical adenopathy.  she has some mild chest/upper abd pain on exam which she attributes to coughing.         Assessment & Plan:

## 2014-08-17 NOTE — Assessment & Plan Note (Addendum)
Will have her seen by GI if not improved with course of diflucan. . Mostly due to concern of her feeling of food sticking in her throat. She has anti-emetics, offered to refill. Will ask her to call back if she is not improved after diflucan.

## 2014-08-18 ENCOUNTER — Telehealth (HOSPITAL_COMMUNITY): Payer: Self-pay

## 2014-08-18 ENCOUNTER — Ambulatory Visit (HOSPITAL_COMMUNITY)
Admission: RE | Admit: 2014-08-18 | Payer: Medicare Other | Source: Ambulatory Visit | Attending: Orthopedic Surgery | Admitting: Orthopedic Surgery

## 2014-08-18 NOTE — Telephone Encounter (Signed)
On 10/27 pt had left a message asking that we confirm her 10/28 appt.  I called the morning of 10/28 to let her know the time of the appointment but there was no answer and unable to leave a message

## 2014-08-19 ENCOUNTER — Other Ambulatory Visit: Payer: Self-pay | Admitting: *Deleted

## 2014-08-19 DIAGNOSIS — B20 Human immunodeficiency virus [HIV] disease: Secondary | ICD-10-CM

## 2014-08-19 MED ORDER — RALTEGRAVIR POTASSIUM 400 MG PO TABS: 400.0000 mg | ORAL_TABLET | Freq: Two times a day (BID) | ORAL | Status: DC

## 2014-08-20 ENCOUNTER — Ambulatory Visit (HOSPITAL_COMMUNITY): Payer: Medicare Other | Admitting: Physical Therapy

## 2014-08-24 ENCOUNTER — Ambulatory Visit (HOSPITAL_COMMUNITY): Payer: Medicare Other | Attending: Orthopedic Surgery | Admitting: Physical Therapy

## 2014-08-24 DIAGNOSIS — Z96652 Presence of left artificial knee joint: Secondary | ICD-10-CM | POA: Insufficient documentation

## 2014-08-24 DIAGNOSIS — M170 Bilateral primary osteoarthritis of knee: Secondary | ICD-10-CM | POA: Insufficient documentation

## 2014-08-24 DIAGNOSIS — I10 Essential (primary) hypertension: Secondary | ICD-10-CM | POA: Insufficient documentation

## 2014-08-24 DIAGNOSIS — Z471 Aftercare following joint replacement surgery: Secondary | ICD-10-CM | POA: Insufficient documentation

## 2014-08-24 DIAGNOSIS — E785 Hyperlipidemia, unspecified: Secondary | ICD-10-CM | POA: Insufficient documentation

## 2014-08-24 DIAGNOSIS — K219 Gastro-esophageal reflux disease without esophagitis: Secondary | ICD-10-CM | POA: Insufficient documentation

## 2014-08-25 ENCOUNTER — Telehealth: Payer: Self-pay | Admitting: *Deleted

## 2014-08-25 NOTE — Telephone Encounter (Signed)
Feels a little better, though still a little tight in her throat.  She was able to hold down [place] stromboli, but threw up half of her food on other days.  She has had stomach issues (pepto bismol helped some).  Patient would like to pursue GI evaluation as suggested by Dr. Johnnye Sima.  Patient has [facility], is active with her PCP Dr. Jonelle Sidle.  Forwarded notes to Dr. Jonelle Sidle for this referral. Landis Gandy, RN

## 2014-08-26 ENCOUNTER — Ambulatory Visit (HOSPITAL_COMMUNITY): Payer: Medicare Other | Admitting: Physical Therapy

## 2014-09-06 ENCOUNTER — Encounter: Payer: Self-pay | Admitting: Internal Medicine

## 2014-09-07 ENCOUNTER — Encounter: Payer: Self-pay | Admitting: Internal Medicine

## 2014-09-14 NOTE — Addendum Note (Signed)
Encounter addended by: Leeroy Cha, PT on: 09/14/2014  4:03 PM<BR>     Documentation filed: Clinical Notes

## 2014-09-14 NOTE — Therapy (Signed)
  Patient Details  Name: Carrell Palmatier MRN: [id number] Date of Birth: [birth year]  Encounter Date: 07/29/2014 PHYSICAL THERAPY DISCHARGE SUMMARY  Visits from Start of Care:1 Current functional level related to goals / functional outcomes: same   Remaining deficits: sane   Education / Equipment: One time treatment pt did not return Plan: Patient agrees to discharge.  Patient goals were not met. Patient is being discharged due to not returning since the last visit.  ?????      RUSSELL,CINDY 09/14/2014, 4:01 PM

## 2014-09-22 ENCOUNTER — Other Ambulatory Visit: Payer: Self-pay

## 2014-09-22 ENCOUNTER — Ambulatory Visit (INDEPENDENT_AMBULATORY_CARE_PROVIDER_SITE_OTHER): Payer: Medicare Other | Admitting: Gastroenterology

## 2014-09-22 ENCOUNTER — Encounter: Payer: Self-pay | Admitting: Gastroenterology

## 2014-09-22 ENCOUNTER — Telehealth: Payer: Self-pay | Admitting: Internal Medicine

## 2014-09-22 ENCOUNTER — Other Ambulatory Visit: Payer: Self-pay | Admitting: Gastroenterology

## 2014-09-22 VITALS — BP 105/80 | HR 96 | Temp 97.6°F | Ht 65.0 in | Wt 164.4 lb

## 2014-09-22 DIAGNOSIS — R112 Nausea with vomiting, unspecified: Secondary | ICD-10-CM

## 2014-09-22 DIAGNOSIS — R1013 Epigastric pain: Secondary | ICD-10-CM | POA: Insufficient documentation

## 2014-09-22 DIAGNOSIS — R1310 Dysphagia, unspecified: Secondary | ICD-10-CM

## 2014-09-22 DIAGNOSIS — R1319 Other dysphagia: Secondary | ICD-10-CM | POA: Insufficient documentation

## 2014-09-22 DIAGNOSIS — K219 Gastro-esophageal reflux disease without esophagitis: Secondary | ICD-10-CM

## 2014-09-22 DIAGNOSIS — R1314 Dysphagia, pharyngoesophageal phase: Secondary | ICD-10-CM

## 2014-09-22 DIAGNOSIS — R1030 Lower abdominal pain, unspecified: Secondary | ICD-10-CM

## 2014-09-22 DIAGNOSIS — K860 Alcohol-induced chronic pancreatitis: Secondary | ICD-10-CM

## 2014-09-22 DIAGNOSIS — K5900 Constipation, unspecified: Secondary | ICD-10-CM

## 2014-09-22 MED ORDER — LUBIPROSTONE 8 MCG PO CAPS: 8.0000 ug | ORAL_CAPSULE | Freq: Two times a day (BID) | ORAL | Status: DC

## 2014-09-22 NOTE — Telephone Encounter (Signed)
Done

## 2014-09-22 NOTE — Patient Instructions (Signed)
1. Upper endoscopy with Dr. Gala Romney. See separate instructions. 2. Please have you labs done.  3. Return to the office in 3 months to follow up on chronic pancreatitis and discuss colonoscopy.

## 2014-09-22 NOTE — Assessment & Plan Note (Signed)
53 year old female with history of HIV who presents for further evaluation of refractory GERD symptoms, intermittent vomiting, epigastric pain, dysphagia. Recent short empirical trial of Diflucan without any improvement of her symptoms. On Nexium for years. Consider gastritis, peptic ulcer disease, opportunistic infection, esophageal stricture. Recommend EGD with possible dilation in the near future. Plan for deep sedation given history of polypharmacy and frequent alcohol use.  I have discussed the risks, alternatives, benefits with regards to but not limited to the risk of reaction to medication, bleeding, infection, perforation and the patient is agreeable to proceed. Written consent to be obtained.

## 2014-09-22 NOTE — Assessment & Plan Note (Addendum)
Epigastric pain may be related to poorly controlled reflux, gastritis, peptic ulcer disease. Pancreatic etiology not excluded given CT evidence of chronic pancreatitis. Continues to drink alcohol regularly. Also noted to have CBD dilation and may need to have MRCP. Check labs including lipase, CBC,CMET. EGD as planned. Return to office in 3 months for chronic pancreatitis and consider initial colonoscopy (patient not interested in pursuing at this time).

## 2014-09-22 NOTE — Telephone Encounter (Signed)
Magda Paganini, were you going to send in rx?

## 2014-09-22 NOTE — Telephone Encounter (Signed)
Patient was seen today and called this afternoon checking on her prescription for constipation if it has been called into [place]. I told her from looking at her chart I didn't see it and the nurse wasn't back from lunch yet and I would send her a message to follow up on that. Please advise. [phone]

## 2014-09-22 NOTE — Telephone Encounter (Signed)
Noted. RX sent to [facility] And requested delivery.

## 2014-09-22 NOTE — Progress Notes (Signed)
Primary Care Physician:  Barbette Merino, MD Referring MD: Dr. Bobby Rumpf Primary Gastroenterologist:  Garfield Cornea, MD   Chief Complaint  Patient presents with  . Gastrophageal Reflux  . Nausea  . Emesis    x 2 weeks    HPI:  [name] is a 53 y.o. female here for further evaluation of N/V, difficultly swallowing. Referred by Dr. Johnnye Sima, infectious disease. Her PCP is Dr. Jonelle Sidle.  Patient reports 6-8 week history of difficulty swallowing, nausea/vomiting, GERD. She's been on Nexium for years. History of HIV for nearly a decade. Last HIV RNA 37 copies per mL on 06/22/2014. CD4 T Cell Ab 1330/CD4%helper T cell 22.  Initially felt like couldn't swallowing solids or liquids. Then noticed food sticking in lower chest associated with vomiting up food. Last Friday started abx for sinus issues. She was given Fluconazole 100mg  daily for 7 days in 07/2014 by Dr. Johnnye Sima for possibly candida esophagitis.   Heartburn poorly managed. Still has to take TUMS for heartburn. At least 10 pound weight loss. Appetite has been poor. Last hospitalization with knee replacement in 02/2014. BM have been difficult. Dulcolax didn't work well. Amitiza 90mcg daily now with some improvement, provided samples by PCP. Takes lomotil for "stomach upset" as needed. Given h/o PUD years ago while living in [place]. ?EGD about 10 years ago. No prior colonoscopy. H/o pancreatitis, hospitalized couple of times. CT abdomen and pelvis with contrast in July 2014 showed sequelae of prior/chronic pancreatitis, mild intra-and extra hepatic ductal dilation progressed from 2008 with common bile duct measuring up to 11 mm. Patient denies any evaluation of this. Patient reports a black stool. Heart noted to increase to 120 with minimal exertion. Patient asymptomatic.   Current Outpatient Prescriptions  Medication Sig Dispense Refill  . albuterol (PROVENTIL HFA;VENTOLIN HFA) 108 (90 BASE) MCG/ACT inhaler Inhale 2 puffs into the lungs every 4  (four) hours as needed for shortness of breath.     Marland Kitchen albuterol (PROVENTIL) (2.5 MG/3ML) 0.083% nebulizer solution Take 2.5 mg by nebulization every 6 (six) hours as needed for wheezing or shortness of breath.    . ALPRAZolam (XANAX) 1 MG tablet Take one tablet by mouth twice daily as needed for anxiety 60 tablet 5  . Azelastine HCl 0.15 % SOLN Place 1 spray into the nose daily as needed (for allergies).     . beclomethasone (QVAR) 40 MCG/ACT inhaler Inhale 2 puffs into the lungs 2 (two) times daily.    . bisacodyl (DULCOLAX) 5 MG EC tablet Take 1 tablet (5 mg total) by mouth daily as needed for moderate constipation. 30 tablet 0  . cetirizine (ZYRTEC) 10 MG tablet Take 10 mg by mouth at bedtime.     . cloNIDine (CATAPRES) 0.2 MG tablet Take 0.1 mg by mouth at bedtime.    . cyanocobalamin (,VITAMIN B-12,) 1000 MCG/ML injection Inject 1,000 mcg into the muscle every 30 (thirty) days.    . Darunavir Ethanolate (PREZISTA) 800 MG tablet Take 800 mg by mouth daily with breakfast.    . diazepam (VALIUM) 5 MG tablet     . diphenoxylate-atropine (LOMOTIL) 2.5-0.025 MG per tablet Take 1 tablet by mouth 4 (four) times daily as needed for diarrhea or loose stools. 120 tablet 0  . emtricitabine-tenofovir (TRUVADA) 200-300 MG per tablet Take 1 tablet by mouth daily. 30 tablet 3  . esomeprazole (NEXIUM) 40 MG capsule Take 40 mg by mouth daily before breakfast.    . fluconazole (DIFLUCAN) 100 MG tablet Take 1 tablet (100 mg  total) by mouth daily. 7 tablet 0  . folic acid (FOLVITE) 1 MG tablet Take 1 mg by mouth daily.    . methocarbamol (ROBAXIN) 750 MG tablet Take 1 tablet (750 mg total) by mouth every 6 (six) hours as needed for muscle spasms. 75 tablet 1  . ondansetron (ZOFRAN) 4 MG tablet Take 4 mg by mouth every 8 (eight) hours as needed for nausea or vomiting.    Marland Kitchen oxybutynin (DITROPAN-XL) 10 MG 24 hr tablet Take 10 mg by mouth 2 (two) times daily.    Marland Kitchen oxyCODONE-acetaminophen (PERCOCET) 7.5-325 MG per  tablet 1 tablet 4 (four) times daily as needed.    . Phenazopyridine HCl (AZO TABS PO) Take 2 tablets by mouth daily as needed (urinary symptoms).    . potassium chloride (K-DUR) 10 MEQ tablet Take 10 mEq by mouth daily.    . promethazine (PHENERGAN) 25 MG tablet Take 25 mg by mouth every 6 (six) hours as needed for nausea.    . raltegravir (ISENTRESS) 400 MG tablet Take 1 tablet (400 mg total) by mouth 2 (two) times daily. 60 tablet 5  . ritonavir (NORVIR) 100 MG capsule Take 1 capsule (100 mg total) by mouth daily with breakfast. 30 capsule 3  . tiotropium (SPIRIVA) 18 MCG inhalation capsule Place 18 mcg into inhaler and inhale daily.    . traMADol (ULTRAM) 50 MG tablet     . verapamil (CALAN-SR) 240 MG CR tablet Take 240 mg by mouth daily.    . Vitamin D, Ergocalciferol, (DRISDOL) 50000 UNITS CAPS Take 50,000 Units by mouth every 7 (seven) days. Take on mondays    . zolpidem (AMBIEN) 5 MG tablet Take one tablet by mouth at bedtime as needed for sleep 30 tablet 0  . naphazoline-pheniramine (NAPHCON-A) 0.025-0.3 % ophthalmic solution Place 1 drop into both eyes 4 (four) times daily as needed for irritation.    . [DISCONTINUED] QUEtiapine (SEROQUEL) 100 MG tablet Take 1 tablet (100 mg total) by mouth at bedtime. Take one tablet by mouth at bedtime     No current facility-administered medications for this visit.    Allergies as of 09/22/2014 - Review Complete 09/22/2014  Allergen Reaction Noted  . Aspirin Nausea And Vomiting 07/02/2011  . Sulfonamide derivatives Hives     Past Medical History  Diagnosis Date  . Baker's cyst   . Pulmonary lesion 1/03    on RUL onn CT   . Hemorrhoids   . Zoster 2001  . Family history of diabetes mellitus   . Tobacco user   . Other abnormal Papanicolaou smear of cervix and cervical HPV(795.09)   . Herpes zoster   . Hemorrhoids   . Baker's cyst   . Pancreatitis   . HIV disease     take Prezista,Truvada,Norvir,and Isentress daily  . Bipolar disorder      With hx of psychotic features.  . Articular cartilage disorder of knee   . Bursitis of shoulder   . Rotator cuff arthropathy   . Hypertension     takes Clonidine and Verapamil daily  . Emphysema   . Asthma     Albuterol resue inhaler and QVAR daily  . Shortness of breath     with exertion and stress  . Dizziness   . Joint pain   . Joint swelling   . Chronic back pain     buldging disc  . Bruises easily   . Gastric ulcer   . GERD (gastroesophageal reflux disease)  takes Nexium daily  . Chronic constipation   . Urinary frequency   . Urinary incontinence   . Nocturia   . History of blood transfusion at age 59  . Early cataracts, bilateral   . Anxiety   . Depression     takes Xanax daily and Depakote bid  . History of shingles   . 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 Tobacco abuse 10/26/2012  . Chronic back pain   . Chronic knee pain   . Complication of anesthesia     AGGITATED  . Uterine cancer     "had some kind of cancer in my teens; had to have TAH to remove it"  . Headache(784.0)     "often here lately; weather changing"  . Pneumonia     "several times"  . Arthritis     "all over" (03/16/2014)  . Osteoarthritis of left knee 03/16/2014  . Alcohol withdrawal delirium 03/22/2014    Past Surgical History  Procedure Laterality Date  . Breast lumpectomy Right   . Breast biopsy Right ~ 1980  . Cholecystectomy  10+yrs ago  . Knee arthroscopy Bilateral   . Finger surgery Right     rt middle finger with pin in it  . Epidural injections    . Esophagogastroduodenoscopy    . Shoulder hemi-arthroplasty  05/06/2012    Procedure: SHOULDER HEMI-ARTHROPLASTY;  Surgeon: Johnny Bridge, MD;  Location: [facility];  Service: Orthopedics;  Laterality: Right;  . Joint replacement    . Fracture surgery      Right finger third digit  . Appendectomy  1981    with hysterectomy  . Total abdominal hysterectomy  1981    w/BSO  . Total knee  arthroplasty Left 03/16/2014    Procedure: LEFT TOTAL KNEE ARTHROPLASTY;  Surgeon: Johnny Bridge, MD;  Location: [facility];  Service: Orthopedics;  Laterality: Left;    Family History  Problem Relation Age of Onset  . Diabetes      Family History  . Hypertension      Family history   . Hypertension Mother   . Diabetes Mother   . Heart failure Mother   . Diabetes Father   . Cancer Sister 53    x2 (liver)  . Diabetes Brother   . Colon cancer Neg Hx   . Pancreatic disease Neg Hx     History   Social History  . Marital Status: Widowed    Spouse Name: N/A    Number of Children: N/A  . Years of Education: N/A   Occupational History  . Unemployed    Social History Main Topics  . Smoking status: Current Every Day Smoker -- 1.00 packs/day for 32 years    Types: Cigarettes  . Smokeless tobacco: Never Used  . Alcohol Use: 5.4 oz/week    9 Shots of liquor per week     Comment: 03/16/2014 "I drink brandy; 2-3 shots/day; a few days/wk"  . Drug Use: No     Comment: 03/16/2014 "last marijuana was 02/10/2014"  . Sexual Activity:    Partners: Male    Patent examiner Protection: Surgical   Other Topics Concern  . Not on file   Social History Narrative      ROS:  General: Negative for fever, chills, fatigue, weakness.see hpi Eyes: Negative for vision changes.  ENT: Negative for hoarseness, difficulty swallowing , nasal congestion. CV: Negative for chest pain, angina, palpitations, dyspnea on exertion, peripheral edema.  Respiratory: Negative for  dyspnea at rest, dyspnea on exertion, cough, sputum, wheezing.  GI: See history of present illness. GU:  Negative for dysuria, hematuria, urinary incontinence, urinary frequency, nocturnal urination.  MS: +joint pain, low back pain.  Derm: Negative for rash or itching.  Neuro: Negative for weakness, abnormal sensation, seizure, frequent headaches, memory loss, confusion.  Psych: Negative for anxiety, depression, suicidal ideation,  hallucinations.  Endo: see hpi Heme: Negative for bruising or bleeding. Allergy: Negative for rash or hives.    Physical Examination:  BP 105/80 mmHg  Temp(Src) 97.6 F (36.4 C) (Oral)  Ht 5\' 5"  (1.651 m)  Wt 164 lb 6.4 oz (74.571 kg)  BMI 27.36 kg/m2   General: Well-nourished, well-developed in no acute distress. Wants with walker.  Head: Normocephalic, atraumatic.   Eyes: Conjunctiva pink, no icterus. Mouth: Oropharyngeal mucosa moist and pink , no lesions erythema or exudate. Neck: Supple without thyromegaly, masses, or lymphadenopathy.  Lungs: Clear to auscultation bilaterally.  Heart: Regular rate and rhythm, no murmurs rubs or gallops.  Abdomen: Bowel sounds are normal, mild to moderate diffuse tenderness more pronounced in upper abdomen, nondistended, no hepatosplenomegaly or masses, no abdominal bruits or    hernia , no rebound or guarding.   Rectal: not performed Extremities: No lower extremity edema. No clubbing or deformities.  Neuro: Alert and oriented x 4 , grossly normal neurologically.  Skin: Warm and dry, no rash or jaundice.   Psych: Alert and cooperative, normal mood and affect.  Labs: Lab Results  Component Value Date   WBC 9.3 06/22/2014   HGB 13.1 06/22/2014   HCT 40.9 06/22/2014   MCV 83.1 06/22/2014   PLT 281 06/22/2014   Lab Results  Component Value Date   CREATININE 0.63 06/22/2014   BUN 6 06/22/2014   NA 136 06/22/2014   K 3.9 06/22/2014   CL 97 06/22/2014   CO2 24 06/22/2014   Lab Results  Component Value Date   ALT 9 06/22/2014   AST 21 06/22/2014   ALKPHOS 145* 06/22/2014   BILITOT 0.4 06/22/2014      Imaging Studies: No results found.

## 2014-09-22 NOTE — Telephone Encounter (Signed)
Patient said that she wants this prescription to be delivered [facility])

## 2014-09-22 NOTE — Progress Notes (Signed)
cc'ed to pcp and referring doctors

## 2014-09-22 NOTE — Assessment & Plan Note (Addendum)
Increase amitiza 26mcg to BID with food to increase efficacy.Constipation may be contributory to her lower abdominal discomfort.

## 2014-09-23 ENCOUNTER — Ambulatory Visit: Payer: Medicare Other | Admitting: Gastroenterology

## 2014-09-27 ENCOUNTER — Encounter (HOSPITAL_COMMUNITY)
Admission: RE | Admit: 2014-09-27 | Discharge: 2014-09-27 | Disposition: A | Discharge status: Home / Self Care | Payer: Medicare Other | Source: Ambulatory Visit | Attending: Internal Medicine | Admitting: Internal Medicine

## 2014-09-27 NOTE — Patient Instructions (Signed)
[name]  09/27/2014   Your procedure is scheduled on:  09/30/14  Report to [facility] at 07:30 AM.  Call this number if you have problems the morning of surgery: [phone]   Remember:   Do not eat food or drink liquids after midnight.   Take these medicines the morning of surgery with A SIP OF WATER: Nexium, Tramadol, Verapamil, Prezista, Truvada, Isentress and Norvir. You may take your Alprazolam, Diazepam, Methocarbamol, Ondansetron and Oxycodone if needed. Also, use your Albuterol, Qvar and Spiriva inhalers before coming.   Do not wear jewelry, make-up or nail polish.  Do not wear lotions, powders, or perfumes. You may wear deodorant.  Do not shave 48 hours prior to surgery. Men may shave face and neck.  Do not bring valuables to the hospital.  [facility] is not responsible for any belongings or valuables.               Contacts, dentures or bridgework may not be worn into surgery.  Leave suitcase in the car. After surgery it may be brought to your room.  For patients admitted to the hospital, discharge time is determined by your treatment team.               Patients discharged the day of surgery will not be allowed to drive home.   Please read over the following fact sheets that you were given: Anesthesia Post-op Instructions and Care and Recovery After Surgery    Esophagogastroduodenoscopy Esophagogastroduodenoscopy (EGD) is a procedure to examine the lining of the esophagus, stomach, and first part of the small intestine (duodenum). A long, flexible, lighted tube with a camera attached (endoscope) is inserted down the throat to view these organs. This procedure is done to detect problems or abnormalities, such as inflammation, bleeding, ulcers, or growths, in order to treat them. The procedure lasts about 5-20 minutes. It is usually an outpatient procedure, but it may need to be performed in emergency cases in the hospital. LET YOUR CAREGIVER KNOW ABOUT:   Allergies  to food or medicine.  All medicines you are taking, including vitamins, herbs, eyedrops, and over-the-counter medicines and creams.  Use of steroids (by mouth or creams).  Previous problems you or members of your family have had with the use of anesthetics.  Any blood disorders you have.  Previous surgeries you have had.  Other health problems you have.  Possibility of pregnancy, if this applies. RISKS AND COMPLICATIONS  Generally, EGD is a safe procedure. However, as with any procedure, complications can occur. Possible complications include:  Infection.  Bleeding.  Tearing (perforation) of the esophagus, stomach, or duodenum.  Difficulty breathing or not being able to breath.  Excessive sweating.  Spasms of the larynx.  Slowed heartbeat.  Low blood pressure. BEFORE THE PROCEDURE  Do not eat or drink anything for 6-8 hours before the procedure or as directed by your caregiver.  Ask your caregiver about changing or stopping your regular medicines.  If you wear dentures, be prepared to remove them before the procedure.  Arrange for someone to drive you home after the procedure. PROCEDURE   A vein will be accessed to give medicines and fluids. A medicine to relax you (sedative) and a pain reliever will be given through that access into the vein.  A numbing medicine (local anesthetic) may be sprayed on your throat for comfort and to stop you from gagging or coughing.  A mouth guard may be placed in your mouth to protect your  teeth and to keep you from biting on the endoscope.  You will be asked to lie on your left side.  The endoscope is inserted down your throat and into the esophagus, stomach, and duodenum.  Air is put through the endoscope to allow your caregiver to view the lining of your esophagus clearly.  The esophagus, stomach, and duodenum is then examined. During the exam, your caregiver may:  Remove tissue to be examined under a microscope (biopsy)  for inflammation, infection, or other medical problems.  Remove growths.  Remove objects (foreign bodies) that are stuck.  Treat any bleeding with medicines or other devices that stop tissues from bleeding (hot cautery, clipping devices).  Widen (dilate) or stretch narrowed areas of the esophagus and stomach.  The endoscope will then be withdrawn. AFTER THE PROCEDURE  You will be taken to a recovery area to be monitored. You will be able to go home once you are stable and alert.  Do not eat or drink anything until the local anesthetic and numbing medicines have worn off. You may choke.  It is normal to feel bloated, have pain with swallowing, or have a sore throat for a short time. This will wear off.  Your caregiver should be able to discuss his or her findings with you. It will take longer to discuss the test results if any biopsies were taken. Document Released: 02/08/2005 Document Revised: 02/22/2014 Document Reviewed: 09/10/2012 [facility] Patient Information 2015 [place], [place]. This information is not intended to replace advice given to you by your health care provider. Make sure you discuss any questions you have with your health care provider.    Esophageal Dilatation The esophagus is the long, narrow tube which carries food and liquid from the mouth to the stomach. Esophageal dilatation is the technique used to stretch a blocked or narrowed portion of the esophagus. This procedure is used when a part of the esophagus has become so narrow that it becomes difficult, painful or even impossible to swallow. This is generally an uncomplicated form of treatment. When this is not successful, chest surgery may be required. This is a much more extensive form of treatment with a longer recovery time. CAUSES  Some of the more common causes of blockage or strictures of the esophagus are:  Narrowing from longstanding inflammation (soreness and redness) of the lower esophagus. This comes from  the constant exposure of the lower esophagus to the acid which bubbles up from the stomach. Over time this causes scarring and narrowing of the lower esophagus.  Hiatal hernia in which a small part of the stomach bulges (herniates) up through the diaphragm. This can cause a gradual narrowing of the end of the esophagus.  Schatzki ring is a narrow ring of benign (non-cancerous) fibrous tissue which constricts the lower esophagus. The reason for this is not known.  Scleroderma is a connective tissue disorder that affects the esophagus and makes swallowing difficult.  Achalasia is an absence of nerves to the lower esophagus and to the esophageal sphincter. This is the circular muscle between the stomach and esophagus that relaxes to allow food into the stomach. After swallowing, it contracts to keep food in the stomach. This absence of nerves may be congenital (present since birth). This can cause irregular spasms of the lower esophageal muscle. This spasm does not open up to allow food and fluid through. The result is a persistent blockage with subsequent slow trickling of the esophageal contents into the stomach.  Strictures may develop from  swallowing materials which damage the esophagus. Some examples are strong acids or alkalis such as lye.  Growths such as benign (non-cancerous) and malignant (cancerous) tumors can block the esophagus.  Hereditary (present since birth) causes. DIAGNOSIS  Your caregiver often suspects this problem by taking a medical history. They will also do a physical exam. They can then prove their suspicions using X-rays and endoscopy. Endoscopy is an exam in which a tube like a small, flexible telescope is used to look at your esophagus.  TREATMENT There are different stretching (dilating) techniques that can be used. Simple bougie dilatation may be done in the office. This usually takes only a couple minutes. A numbing (anesthetic) spray of the throat is used. Endoscopy,  when done, is done in an endoscopy suite under mild sedation. When fluoroscopy is used, the procedure is performed in X-ray. Other techniques require a little longer time. Recovery is usually quick. There is no waiting time to begin eating and drinking to test success of the treatment. Following are some of the methods used. Narrowing of the esophagus is treated by making it bigger. Commonly this is a mechanical problem which can be treated with stretching. This can be done in different ways. Your caregiver will discuss these with you. Some of the means used are:  A series of graduated (increasing thickness) flexible dilators can be used. These are weighted tubes passed through the esophagus into the stomach. The tubes used become progressively larger until the desired stretched size is reached. Graduated dilators are a simple and quick way of opening the esophagus. No visualization is required.  Another method is the use of endoscopy to place a flexible wire across the stricture. The endoscope is removed and the wire left in place. A dilator with a hole through it from end to end is guided down the esophagus and across the stricture. One or more of these dilators are passed over the wire. At the end of the exam, the wire is removed. This type of treatment may be performed in the X-ray department under fluoroscopy. An advantage of this procedure is the examiner is visualizing the end opening in the esophagus.  Stretching of the esophagus may be done using balloons. Deflated balloons are placed through the endoscope and across the stricture. This type of balloon dilatation is often done at the time of endoscopy or fluoroscopy. Flexible endoscopy allows the examiner to directly view the stricture. A balloon is inserted in the deflated form into the area of narrowing. It is then inflated with air to a certain pressure that is preset for a given circumference. When inflated, it becomes sausage shaped, stretched,  and makes the stricture larger.  Achalasia requires a longer, larger balloon-type dilator. This is frequently done under X-ray control. In this situation, the spastic muscle fibers in the lower esophagus are stretched. All of the above procedures make the passage of food and water into the stomach easier. They also make it easier for stomach contents to reflux back into the esophagus. Special medications may be used following the procedure to help prevent further stricturing. Proton-pump inhibitor medications are good at decreasing the amount of acid in the stomach juice. When stomach juice refluxes into the esophagus, the juice is no longer as acidic and is less likely to burn or scar the esophagus. RISKS AND COMPLICATIONS Esophageal dilatation is usually performed effectively and without problems. Some complications that can occur are:  A small amount of bleeding almost always happens where the stretching takes  place. If this is too excessive it may require more aggressive treatment.  An uncommon complication is perforation (making a hole) of the esophagus. The esophagus is thin. It is easy to make a hole in it. If this happens, an operation may be necessary to repair this.  A small, undetected perforation could lead to an infection in the chest. This can be very serious. HOME CARE INSTRUCTIONS   If you received sedation for your procedure, do not drive, make important decisions, or perform any activities requiring your full coordination. Do not drink alcohol, take sedatives, or use any mind altering chemicals unless instructed by your caregiver.  You may use throat lozenges or warm salt water gargles if you have throat discomfort.  You can begin eating and drinking normally on return home unless instructed otherwise. Do not purposely try to force large chunks of food down to test the benefits of your procedure.  Mild discomfort can be eased with sips of ice water.  Medications for discomfort  may or may not be needed. SEEK IMMEDIATE MEDICAL CARE IF:   You begin vomiting up blood.  You develop black, tarry stools.  You develop chills or an unexplained temperature of over 101F (38.3C)  You develop chest or abdominal pain.  You develop shortness of breath, or feel light-headed or faint.  Your swallowing is becoming more painful, difficult, or you are unable to swallow. MAKE SURE YOU:   Understand these instructions.  Will watch your condition.  Will get help right away if you are not doing well or get worse. Document Released: 11/29/2005 Document Revised: 02/22/2014 Document Reviewed: 01/16/2006 [facility] Patient Information 2015 [place], [place]. This information is not intended to replace advice given to you by your health care provider. Make sure you discuss any questions you have with your health care provider.    PATIENT INSTRUCTIONS POST-ANESTHESIA  IMMEDIATELY FOLLOWING SURGERY:  Do not drive or operate machinery for the first twenty four hours after surgery.  Do not make any important decisions for twenty four hours after surgery or while taking narcotic pain medications or sedatives.  If you develop intractable nausea and vomiting or a severe headache please notify your doctor immediately.  FOLLOW-UP:  Please make an appointment with your surgeon as instructed. You do not need to follow up with anesthesia unless specifically instructed to do so.  WOUND CARE INSTRUCTIONS (if applicable):  Keep a dry clean dressing on the anesthesia/puncture wound site if there is drainage.  Once the wound has quit draining you may leave it open to air.  Generally you should leave the bandage intact for twenty four hours unless there is drainage.  If the epidural site drains for more than 36-48 hours please call the anesthesia department.  QUESTIONS?:  Please feel free to call your physician or the hospital operator if you have any questions, and they will be happy to assist you.

## 2014-09-28 ENCOUNTER — Other Ambulatory Visit: Payer: Self-pay

## 2014-09-28 ENCOUNTER — Encounter (HOSPITAL_COMMUNITY)
Admission: RE | Admit: 2014-09-28 | Discharge: 2014-09-28 | Disposition: A | Discharge status: Home / Self Care | Payer: Medicare Other | Source: Ambulatory Visit | Attending: Internal Medicine | Admitting: Internal Medicine

## 2014-09-28 ENCOUNTER — Encounter (HOSPITAL_COMMUNITY): Payer: Self-pay

## 2014-09-28 DIAGNOSIS — F17210 Nicotine dependence, cigarettes, uncomplicated: Secondary | ICD-10-CM | POA: Diagnosis not present

## 2014-09-28 DIAGNOSIS — J439 Emphysema, unspecified: Secondary | ICD-10-CM | POA: Diagnosis not present

## 2014-09-28 DIAGNOSIS — K229 Disease of esophagus, unspecified: Secondary | ICD-10-CM | POA: Diagnosis not present

## 2014-09-28 DIAGNOSIS — K861 Other chronic pancreatitis: Secondary | ICD-10-CM | POA: Diagnosis not present

## 2014-09-28 DIAGNOSIS — Z21 Asymptomatic human immunodeficiency virus [HIV] infection status: Secondary | ICD-10-CM | POA: Diagnosis not present

## 2014-09-28 DIAGNOSIS — K2950 Unspecified chronic gastritis without bleeding: Secondary | ICD-10-CM | POA: Diagnosis not present

## 2014-09-28 DIAGNOSIS — M1990 Unspecified osteoarthritis, unspecified site: Secondary | ICD-10-CM | POA: Diagnosis not present

## 2014-09-28 DIAGNOSIS — J45909 Unspecified asthma, uncomplicated: Secondary | ICD-10-CM | POA: Diagnosis not present

## 2014-09-28 DIAGNOSIS — I10 Essential (primary) hypertension: Secondary | ICD-10-CM | POA: Diagnosis not present

## 2014-09-28 DIAGNOSIS — R1319 Other dysphagia: Secondary | ICD-10-CM | POA: Diagnosis present

## 2014-09-28 DIAGNOSIS — K210 Gastro-esophageal reflux disease with esophagitis: Secondary | ICD-10-CM | POA: Diagnosis not present

## 2014-09-28 DIAGNOSIS — R1013 Epigastric pain: Secondary | ICD-10-CM | POA: Diagnosis not present

## 2014-09-28 DIAGNOSIS — F329 Major depressive disorder, single episode, unspecified: Secondary | ICD-10-CM | POA: Diagnosis not present

## 2014-09-28 DIAGNOSIS — K449 Diaphragmatic hernia without obstruction or gangrene: Secondary | ICD-10-CM | POA: Diagnosis not present

## 2014-09-28 DIAGNOSIS — F419 Anxiety disorder, unspecified: Secondary | ICD-10-CM | POA: Diagnosis not present

## 2014-09-28 LAB — CBC WITH DIFFERENTIAL/PLATELET
BASOS PCT: 0 % (ref 0–1)
BASOS PCT: 0 % (ref 0–1)
Basophils Absolute: 0 10*3/uL (ref 0.0–0.1)
Basophils Absolute: 0 10*3/uL (ref 0.0–0.1)
EOS ABS: 0.2 10*3/uL (ref 0.0–0.7)
EOS PCT: 2 % (ref 0–5)
Eosinophils Absolute: 0.1 10*3/uL (ref 0.0–0.7)
Eosinophils Relative: 1 % (ref 0–5)
HCT: 34.8 % — ABNORMAL LOW (ref 36.0–46.0)
HCT: 36.4 % (ref 36.0–46.0)
Hemoglobin: 11.2 g/dL — ABNORMAL LOW (ref 12.0–15.0)
Hemoglobin: 12 g/dL (ref 12.0–15.0)
LYMPHS ABS: 3.9 10*3/uL (ref 0.7–4.0)
LYMPHS PCT: 43 % (ref 12–46)
Lymphocytes Relative: 50 % — ABNORMAL HIGH (ref 12–46)
Lymphs Abs: 4.3 10*3/uL — ABNORMAL HIGH (ref 0.7–4.0)
MCH: 28.9 pg (ref 26.0–34.0)
MCH: 29.6 pg (ref 26.0–34.0)
MCHC: 32.2 g/dL (ref 30.0–36.0)
MCHC: 33 g/dL (ref 30.0–36.0)
MCV: 89.9 fL (ref 78.0–100.0)
MCV: 89.7 fL (ref 78.0–100.0)
MONO ABS: 0.4 10*3/uL (ref 0.1–1.0)
MPV: 11.1 fL (ref 9.4–12.4)
Monocytes Absolute: 0.7 10*3/uL (ref 0.1–1.0)
Monocytes Relative: 7 % (ref 3–12)
Monocytes Relative: 6 % (ref 3–12)
NEUTROS ABS: 4.9 10*3/uL (ref 1.7–7.7)
NEUTROS PCT: 42 % — AB (ref 43–77)
Neutro Abs: 3.3 10*3/uL (ref 1.7–7.7)
Neutrophils Relative %: 49 % (ref 43–77)
PLATELETS: 250 10*3/uL (ref 150–400)
Platelets: 246 10*3/uL (ref 150–400)
RBC: 3.87 MIL/uL (ref 3.87–5.11)
RBC: 4.06 MIL/uL (ref 3.87–5.11)
RDW: 13.7 % (ref 11.5–15.5)
RDW: 13.2 % (ref 11.5–15.5)
WBC: 10.1 10*3/uL (ref 4.0–10.5)
WBC: 7.8 10*3/uL (ref 4.0–10.5)

## 2014-09-28 LAB — COMPREHENSIVE METABOLIC PANEL
ALK PHOS: 169 U/L — AB (ref 39–117)
ALT: 24 U/L (ref 0–35)
AST: 72 U/L — ABNORMAL HIGH (ref 0–37)
Albumin: 2.8 g/dL — ABNORMAL LOW (ref 3.5–5.2)
BUN: 8 mg/dL (ref 6–23)
CALCIUM: 8.2 mg/dL — AB (ref 8.4–10.5)
CHLORIDE: 100 meq/L (ref 96–112)
CO2: 24 mEq/L (ref 19–32)
Creat: 0.67 mg/dL (ref 0.50–1.10)
Glucose, Bld: 94 mg/dL (ref 70–99)
POTASSIUM: 3.8 meq/L (ref 3.5–5.3)
Sodium: 136 mEq/L (ref 135–145)
Total Bilirubin: 0.9 mg/dL (ref 0.2–1.2)
Total Protein: 6 g/dL (ref 6.0–8.3)

## 2014-09-28 LAB — BASIC METABOLIC PANEL
Anion gap: 14 (ref 5–15)
BUN: 4 mg/dL — ABNORMAL LOW (ref 6–23)
CALCIUM: 8.8 mg/dL (ref 8.4–10.5)
CO2: 26 mEq/L (ref 19–32)
CREATININE: 0.52 mg/dL (ref 0.50–1.10)
Chloride: 100 mEq/L (ref 96–112)
GLUCOSE: 95 mg/dL (ref 70–99)
Potassium: 4.4 mEq/L (ref 3.7–5.3)
Sodium: 140 mEq/L (ref 137–147)

## 2014-09-28 LAB — LIPASE: Lipase: <10 U/L (ref 0–75)

## 2014-09-28 NOTE — Pre-Procedure Instructions (Signed)
Patient given information to sign up for my chart at home. 

## 2014-09-30 ENCOUNTER — Ambulatory Visit (HOSPITAL_COMMUNITY)
Admission: RE | Admit: 2014-09-30 | Discharge: 2014-09-30 | Disposition: A | Discharge status: Home / Self Care | Payer: Medicare Other | Source: Ambulatory Visit | Attending: Internal Medicine | Admitting: Internal Medicine

## 2014-09-30 ENCOUNTER — Ambulatory Visit (HOSPITAL_COMMUNITY): Payer: Medicare Other | Admitting: Anesthesiology

## 2014-09-30 ENCOUNTER — Encounter (HOSPITAL_COMMUNITY): Payer: Self-pay

## 2014-09-30 ENCOUNTER — Encounter (HOSPITAL_COMMUNITY)
Admission: RE | Disposition: A | Discharge status: Home / Self Care | Payer: Self-pay | Source: Ambulatory Visit | Attending: Internal Medicine

## 2014-09-30 DIAGNOSIS — F419 Anxiety disorder, unspecified: Secondary | ICD-10-CM | POA: Insufficient documentation

## 2014-09-30 DIAGNOSIS — K210 Gastro-esophageal reflux disease with esophagitis: Secondary | ICD-10-CM | POA: Insufficient documentation

## 2014-09-30 DIAGNOSIS — F329 Major depressive disorder, single episode, unspecified: Secondary | ICD-10-CM | POA: Insufficient documentation

## 2014-09-30 DIAGNOSIS — Z21 Asymptomatic human immunodeficiency virus [HIV] infection status: Secondary | ICD-10-CM | POA: Insufficient documentation

## 2014-09-30 DIAGNOSIS — K229 Disease of esophagus, unspecified: Secondary | ICD-10-CM | POA: Insufficient documentation

## 2014-09-30 DIAGNOSIS — M1990 Unspecified osteoarthritis, unspecified site: Secondary | ICD-10-CM | POA: Insufficient documentation

## 2014-09-30 DIAGNOSIS — K209 Esophagitis, unspecified without bleeding: Secondary | ICD-10-CM | POA: Insufficient documentation

## 2014-09-30 DIAGNOSIS — F17210 Nicotine dependence, cigarettes, uncomplicated: Secondary | ICD-10-CM | POA: Insufficient documentation

## 2014-09-30 DIAGNOSIS — K2970 Gastritis, unspecified, without bleeding: Secondary | ICD-10-CM | POA: Diagnosis not present

## 2014-09-30 DIAGNOSIS — K2990 Gastroduodenitis, unspecified, without bleeding: Secondary | ICD-10-CM

## 2014-09-30 DIAGNOSIS — K2950 Unspecified chronic gastritis without bleeding: Secondary | ICD-10-CM | POA: Diagnosis not present

## 2014-09-30 DIAGNOSIS — K861 Other chronic pancreatitis: Secondary | ICD-10-CM | POA: Insufficient documentation

## 2014-09-30 DIAGNOSIS — I10 Essential (primary) hypertension: Secondary | ICD-10-CM | POA: Insufficient documentation

## 2014-09-30 DIAGNOSIS — J45909 Unspecified asthma, uncomplicated: Secondary | ICD-10-CM | POA: Insufficient documentation

## 2014-09-30 DIAGNOSIS — J439 Emphysema, unspecified: Secondary | ICD-10-CM | POA: Insufficient documentation

## 2014-09-30 DIAGNOSIS — R1013 Epigastric pain: Secondary | ICD-10-CM | POA: Insufficient documentation

## 2014-09-30 DIAGNOSIS — K449 Diaphragmatic hernia without obstruction or gangrene: Secondary | ICD-10-CM | POA: Insufficient documentation

## 2014-09-30 DIAGNOSIS — R1319 Other dysphagia: Secondary | ICD-10-CM | POA: Insufficient documentation

## 2014-09-30 HISTORY — PX: ESOPHAGOGASTRODUODENOSCOPY (EGD) WITH PROPOFOL: SHX5813

## 2014-09-30 HISTORY — PX: BIOPSY: SHX5522

## 2014-09-30 SURGERY — ESOPHAGOGASTRODUODENOSCOPY (EGD) WITH PROPOFOL
Anesthesia: Monitor Anesthesia Care

## 2014-09-30 MED ORDER — PROPOFOL INFUSION 10 MG/ML OPTIME
INTRAVENOUS | Status: DC | PRN
  Administered 2014-09-30: 50 ug/kg/min via INTRAVENOUS

## 2014-09-30 MED ORDER — FENTANYL CITRATE 0.05 MG/ML IJ SOLN
25.0000 ug | INTRAMUSCULAR | Status: AC
  Administered 2014-09-30 (×2): 25 ug via INTRAVENOUS

## 2014-09-30 MED ORDER — GLYCOPYRROLATE 0.2 MG/ML IJ SOLN
0.2000 mg | Freq: Once | INTRAMUSCULAR | Status: AC
  Administered 2014-09-30: 0.2 mg via INTRAVENOUS

## 2014-09-30 MED ORDER — GLYCOPYRROLATE 0.2 MG/ML IJ SOLN
INTRAMUSCULAR | Status: AC
  Filled 2014-09-30: qty 1

## 2014-09-30 MED ORDER — PROPOFOL 10 MG/ML IV BOLUS
INTRAVENOUS | Status: AC
  Filled 2014-09-30: qty 20

## 2014-09-30 MED ORDER — ALBUTEROL SULFATE (2.5 MG/3ML) 0.083% IN NEBU
INHALATION_SOLUTION | RESPIRATORY_TRACT | Status: AC
Start: 2014-09-30 — End: 2014-09-30
  Filled 2014-09-30: qty 3

## 2014-09-30 MED ORDER — STERILE WATER FOR IRRIGATION IR SOLN
Status: DC | PRN
  Administered 2014-09-30: 1000 mL

## 2014-09-30 MED ORDER — MIDAZOLAM HCL 5 MG/5ML IJ SOLN
INTRAMUSCULAR | Status: DC | PRN
  Administered 2014-09-30 (×2): 1 mg via INTRAVENOUS

## 2014-09-30 MED ORDER — MIDAZOLAM HCL 2 MG/2ML IJ SOLN
INTRAMUSCULAR | Status: AC
  Filled 2014-09-30: qty 2

## 2014-09-30 MED ORDER — SODIUM CHLORIDE 0.9 % IN NEBU
INHALATION_SOLUTION | RESPIRATORY_TRACT | Status: AC
  Filled 2014-09-30: qty 3

## 2014-09-30 MED ORDER — LIDOCAINE VISCOUS 2 % MT SOLN
15.0000 mL | Freq: Once | OROMUCOSAL | Status: AC
  Administered 2014-09-30: 15 mL via OROMUCOSAL
  Filled 2014-09-30: qty 15

## 2014-09-30 MED ORDER — MIDAZOLAM HCL 2 MG/2ML IJ SOLN
1.0000 mg | INTRAMUSCULAR | Status: DC | PRN
  Administered 2014-09-30: 2 mg via INTRAVENOUS

## 2014-09-30 MED ORDER — ONDANSETRON HCL 4 MG/2ML IJ SOLN
INTRAMUSCULAR | Status: AC
  Filled 2014-09-30: qty 2

## 2014-09-30 MED ORDER — LIDOCAINE VISCOUS 2 % MT SOLN
OROMUCOSAL | Status: AC
  Filled 2014-09-30: qty 15

## 2014-09-30 MED ORDER — FENTANYL CITRATE 0.05 MG/ML IJ SOLN: 25.0000 ug | INTRAMUSCULAR | Status: DC | PRN

## 2014-09-30 MED ORDER — FENTANYL CITRATE 0.05 MG/ML IJ SOLN
INTRAMUSCULAR | Status: AC
  Filled 2014-09-30: qty 2

## 2014-09-30 MED ORDER — ONDANSETRON HCL 4 MG/2ML IJ SOLN: 4.0000 mg | Freq: Once | INTRAMUSCULAR | Status: DC | PRN

## 2014-09-30 MED ORDER — ALBUTEROL SULFATE (2.5 MG/3ML) 0.083% IN NEBU
2.5000 mg | INHALATION_SOLUTION | Freq: Once | RESPIRATORY_TRACT | Status: AC
  Administered 2014-09-30: 2.5 mg via RESPIRATORY_TRACT

## 2014-09-30 MED ORDER — LACTATED RINGERS IV SOLN
INTRAVENOUS | Status: DC
  Administered 2014-09-30: 08:00:00 via INTRAVENOUS

## 2014-09-30 MED ORDER — ONDANSETRON HCL 4 MG/2ML IJ SOLN
4.0000 mg | Freq: Once | INTRAMUSCULAR | Status: AC
  Administered 2014-09-30: 4 mg via INTRAVENOUS

## 2014-09-30 SURGICAL SUPPLY — 11 items
BLOCK BITE 60FR ADLT L/F BLUE (MISCELLANEOUS) ×4 IMPLANT
FLOOR PAD 36X40 (MISCELLANEOUS) ×4
FORCEPS BIOP RAD 4 LRG CAP 4 (CUTTING FORCEPS) ×4 IMPLANT
FORMALIN 10 PREFIL 20ML (MISCELLANEOUS) ×8 IMPLANT
KIT CLEAN ENDO COMPLIANCE (KITS) ×4 IMPLANT
MANIFOLD NEPTUNE II (INSTRUMENTS) ×4 IMPLANT
PAD FLOOR 36X40 (MISCELLANEOUS) ×2 IMPLANT
SYR 50ML LL SCALE MARK (SYRINGE) ×4 IMPLANT
TUBING ENDO SMARTCAP PENTAX (MISCELLANEOUS) ×4 IMPLANT
TUBING IRRIGATION ENDOGATOR (MISCELLANEOUS) ×4 IMPLANT
WATER STERILE IRR 1000ML POUR (IV SOLUTION) ×4 IMPLANT

## 2014-09-30 NOTE — Anesthesia Preprocedure Evaluation (Signed)
Anesthesia Evaluation  Patient identified by MRN, date of birth, ID band Patient awake    Reviewed: Allergy & Precautions, H&P , NPO status , Patient's Chart, lab work & pertinent test results, reviewed documented beta blocker date and time   Airway Mallampati: II  TM Distance: <3 FB Neck ROM: full  Mouth opening: Limited Mouth Opening  Dental  (+) Dental Advisory Given, Poor Dentition   Pulmonary shortness of breath, asthma , COPD (emphysema) COPD inhaler, Current Smoker,  Dailly nebs and inhalers for asthma, Still smoking 2 ppd cigs breath sounds clear to auscultation        Cardiovascular hypertension, Pt. on medications Rhythm:Regular Rate:Tachycardia     Neuro/Psych  Headaches, PSYCHIATRIC DISORDERS Anxiety Depression Bipolar Disorder    GI/Hepatic PUD, GERD-  Controlled and Medicated,(+)     substance abuse (DT's 03/2014)  alcohol use,   Endo/Other    Renal/GU      Musculoskeletal  (+) Arthritis -,   Abdominal   Peds  Hematology Elevated LFTs   Anesthesia Other Findings Teeth worn nearly in half; chip on one of back upper right  Reproductive/Obstetrics                             Anesthesia Physical Anesthesia Plan  ASA: IV  Anesthesia Plan: MAC   Post-op Pain Management:    Induction: Intravenous  Airway Management Planned: Simple Face Mask  Additional Equipment:   Intra-op Plan:   Post-operative Plan:   Informed Consent: I have reviewed the patients History and Physical, chart, labs and discussed the procedure including the risks, benefits and alternatives for the proposed anesthesia with the patient or authorized representative who has indicated his/her understanding and acceptance.     Plan Discussed with:   Anesthesia Plan Comments: (Albuterol neb in preop.)        Anesthesia Quick Evaluation

## 2014-09-30 NOTE — Op Note (Signed)
[facility] [address] [place], [address]   ENDOSCOPY PROCEDURE REPORT  PATIENT: Minnette, Merida  MR#: [id number] BIRTHDATE: 1961/08/31 , 53  yrs. old GENDER: female ENDOSCOPIST: R.  Garfield Cornea, MD FACP FACG REFERRED BY:  Bobby Rumpf, M.D. PROCEDURE DATE:  October 18, 2014 PROCEDURE:  EGD w/ biopsy INDICATIONS:  esophageal dysphagia.  Dyspepsia, epigastric pain. HIV infection. MEDICATIONS: deep sedation per Dr.  [facility] ASA CLASS:      Class III  CONSENT: The risks, benefits, limitations, alternatives and imponderables have been discussed.  The potential for biopsy, esophogeal dilation, etc. have also been reviewed.  Questions have been answered.  All parties agreeable.  Please see the history and physical in the medical record for more information.  DESCRIPTION OF PROCEDURE: After the risks benefits and alternatives of the procedure were thoroughly explained, informed consent was obtained.  The    endoscope was introduced through the mouth and advanced to the second portion of the duodenum , limited by Without limitations. The instrument was slowly withdrawn as the mucosa was fully examined.    Abnormal esophageal mucosa with multiple approximately 3 mm whitish pustular lesions throughout the tubular esophagus.  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  No plaques.  No ulcers.  Tubular esophagus patent throughout its course.  Stomach empty.  5 cm hiatal hernia.  Gastric mucosa beefy red diffusely.  No ulcer or infiltrating process seen.  Patent pylorus.  Normal first and second portion of the duodenum.  Biopsies of the abnormal appearing gastric and esophageal mucosa taken.  When biopsies of the pustular esophageal lesions taken, apparent pus was seen to be expressed out of these lesions when the jaws of the biopsy forceps close down on them.  Retroflexed views revealed as previously described.      The scope was then withdrawn from the patient and the procedure completed.  COMPLICATIONS: There were no immediate complications.    No esophageal dilation performed.Marland Kitchen  ENDOSCOPIC IMPRESSION: Esophageal pseudodiverticula present. Multiple pustular esophageal lesions?"status post biopsy.  Patent esophagus.  Large hiatal hernia. Abnormal gastric mucosa was uncertain significance?"status post gastric biopsy.  RECOMMENDATIONS: Follow-up on pathology.  REPEAT EXAM:  eSigned:  R. Garfield Cornea, MD Rosalita Chessman [facility] 10-18-14 10:13 AM    CC:  CPT CODES: ICD CODES:  The ICD and CPT codes recommended by this software are interpretations from the data that the clinical staff has captured with the software.  The verification of the translation of this report to the ICD and CPT codes and modifiers is the sole responsibility of the health care institution and practicing physician where this report was generated.  [place]. will not be held responsible for the validity of the ICD and CPT codes included on this report.  AMA assumes no liability for data contained or not contained herein. CPT is a Designer, television/film set of the [employer].  PATIENT NAME:  Saliyah, Gillin MR#: [id number]

## 2014-09-30 NOTE — Interval H&P Note (Signed)
History and Physical Interval Note:  09/30/2014 9:17 AM  [name]  has presented today for surgery, with the diagnosis of GERD/dysphagia/weight loss/epigastric pain/nausea and vomiting  The various methods of treatment have been discussed with the patient and family. After consideration of risks, benefits and other options for treatment, the patient has consented to  Procedure(s): ESOPHAGOGASTRODUODENOSCOPY (EGD) WITH PROPOFOL (N/A) SAVORY DILATION (N/A) MALONEY DILATION (N/A) as a surgical intervention .  The patient's history has been reviewed, patient examined, no change in status, stable for surgery.  I have reviewed the patient's chart and labs.  Questions were answered to the patient's satisfaction.     Manus Rudd  Sedated(Versed 2 mg already given) .The risks, benefits, limitations, alternatives and imponderables have been reviewed with the patient. Potential for esophageal dilation, biopsy, etc. have also been reviewed.  Questions have been answered. All parties agreeable.

## 2014-09-30 NOTE — H&P (View-Only) (Signed)
Primary Care Physician:  Barbette Merino, MD Referring MD: Dr. Bobby Rumpf Primary Gastroenterologist:  Garfield Cornea, MD   Chief Complaint  Patient presents with  . Gastrophageal Reflux  . Nausea  . Emesis    x 2 weeks    HPI:  [name] is a 53 y.o. female here for further evaluation of N/V, difficultly swallowing. Referred by Dr. Johnnye Sima, infectious disease. Her PCP is Dr. Jonelle Sidle.  Patient reports 6-8 week history of difficulty swallowing, nausea/vomiting, GERD. She's been on Nexium for years. History of HIV for nearly a decade. Last HIV RNA 37 copies per mL on 06/22/2014. CD4 T Cell Ab 1330/CD4%helper T cell 22.  Initially felt like couldn't swallowing solids or liquids. Then noticed food sticking in lower chest associated with vomiting up food. Last Friday started abx for sinus issues. She was given Fluconazole 100mg  daily for 7 days in 07/2014 by Dr. Johnnye Sima for possibly candida esophagitis.   Heartburn poorly managed. Still has to take TUMS for heartburn. At least 10 pound weight loss. Appetite has been poor. Last hospitalization with knee replacement in 02/2014. BM have been difficult. Dulcolax didn't work well. Amitiza 33mcg daily now with some improvement, provided samples by PCP. Takes lomotil for "stomach upset" as needed. Given h/o PUD years ago while living in [place]. ?EGD about 10 years ago. No prior colonoscopy. H/o pancreatitis, hospitalized couple of times. CT abdomen and pelvis with contrast in July 2014 showed sequelae of prior/chronic pancreatitis, mild intra-and extra hepatic ductal dilation progressed from 2008 with common bile duct measuring up to 11 mm. Patient denies any evaluation of this. Patient reports a black stool. Heart noted to increase to 120 with minimal exertion. Patient asymptomatic.   Current Outpatient Prescriptions  Medication Sig Dispense Refill  . albuterol (PROVENTIL HFA;VENTOLIN HFA) 108 (90 BASE) MCG/ACT inhaler Inhale 2 puffs into the lungs every 4  (four) hours as needed for shortness of breath.     Marland Kitchen albuterol (PROVENTIL) (2.5 MG/3ML) 0.083% nebulizer solution Take 2.5 mg by nebulization every 6 (six) hours as needed for wheezing or shortness of breath.    . ALPRAZolam (XANAX) 1 MG tablet Take one tablet by mouth twice daily as needed for anxiety 60 tablet 5  . Azelastine HCl 0.15 % SOLN Place 1 spray into the nose daily as needed (for allergies).     . beclomethasone (QVAR) 40 MCG/ACT inhaler Inhale 2 puffs into the lungs 2 (two) times daily.    . bisacodyl (DULCOLAX) 5 MG EC tablet Take 1 tablet (5 mg total) by mouth daily as needed for moderate constipation. 30 tablet 0  . cetirizine (ZYRTEC) 10 MG tablet Take 10 mg by mouth at bedtime.     . cloNIDine (CATAPRES) 0.2 MG tablet Take 0.1 mg by mouth at bedtime.    . cyanocobalamin (,VITAMIN B-12,) 1000 MCG/ML injection Inject 1,000 mcg into the muscle every 30 (thirty) days.    . Darunavir Ethanolate (PREZISTA) 800 MG tablet Take 800 mg by mouth daily with breakfast.    . diazepam (VALIUM) 5 MG tablet     . diphenoxylate-atropine (LOMOTIL) 2.5-0.025 MG per tablet Take 1 tablet by mouth 4 (four) times daily as needed for diarrhea or loose stools. 120 tablet 0  . emtricitabine-tenofovir (TRUVADA) 200-300 MG per tablet Take 1 tablet by mouth daily. 30 tablet 3  . esomeprazole (NEXIUM) 40 MG capsule Take 40 mg by mouth daily before breakfast.    . fluconazole (DIFLUCAN) 100 MG tablet Take 1 tablet (100 mg  total) by mouth daily. 7 tablet 0  . folic acid (FOLVITE) 1 MG tablet Take 1 mg by mouth daily.    . methocarbamol (ROBAXIN) 750 MG tablet Take 1 tablet (750 mg total) by mouth every 6 (six) hours as needed for muscle spasms. 75 tablet 1  . ondansetron (ZOFRAN) 4 MG tablet Take 4 mg by mouth every 8 (eight) hours as needed for nausea or vomiting.    Marland Kitchen oxybutynin (DITROPAN-XL) 10 MG 24 hr tablet Take 10 mg by mouth 2 (two) times daily.    Marland Kitchen oxyCODONE-acetaminophen (PERCOCET) 7.5-325 MG per  tablet 1 tablet 4 (four) times daily as needed.    . Phenazopyridine HCl (AZO TABS PO) Take 2 tablets by mouth daily as needed (urinary symptoms).    . potassium chloride (K-DUR) 10 MEQ tablet Take 10 mEq by mouth daily.    . promethazine (PHENERGAN) 25 MG tablet Take 25 mg by mouth every 6 (six) hours as needed for nausea.    . raltegravir (ISENTRESS) 400 MG tablet Take 1 tablet (400 mg total) by mouth 2 (two) times daily. 60 tablet 5  . ritonavir (NORVIR) 100 MG capsule Take 1 capsule (100 mg total) by mouth daily with breakfast. 30 capsule 3  . tiotropium (SPIRIVA) 18 MCG inhalation capsule Place 18 mcg into inhaler and inhale daily.    . traMADol (ULTRAM) 50 MG tablet     . verapamil (CALAN-SR) 240 MG CR tablet Take 240 mg by mouth daily.    . Vitamin D, Ergocalciferol, (DRISDOL) 50000 UNITS CAPS Take 50,000 Units by mouth every 7 (seven) days. Take on mondays    . zolpidem (AMBIEN) 5 MG tablet Take one tablet by mouth at bedtime as needed for sleep 30 tablet 0  . naphazoline-pheniramine (NAPHCON-A) 0.025-0.3 % ophthalmic solution Place 1 drop into both eyes 4 (four) times daily as needed for irritation.    . [DISCONTINUED] QUEtiapine (SEROQUEL) 100 MG tablet Take 1 tablet (100 mg total) by mouth at bedtime. Take one tablet by mouth at bedtime     No current facility-administered medications for this visit.    Allergies as of 09/22/2014 - Review Complete 09/22/2014  Allergen Reaction Noted  . Aspirin Nausea And Vomiting 07/02/2011  . Sulfonamide derivatives Hives     Past Medical History  Diagnosis Date  . Baker's cyst   . Pulmonary lesion 1/03    on RUL onn CT   . Hemorrhoids   . Zoster 2001  . Family history of diabetes mellitus   . Tobacco user   . Other abnormal Papanicolaou smear of cervix and cervical HPV(795.09)   . Herpes zoster   . Hemorrhoids   . Baker's cyst   . Pancreatitis   . HIV disease     take Prezista,Truvada,Norvir,and Isentress daily  . Bipolar disorder      With hx of psychotic features.  . Articular cartilage disorder of knee   . Bursitis of shoulder   . Rotator cuff arthropathy   . Hypertension     takes Clonidine and Verapamil daily  . Emphysema   . Asthma     Albuterol resue inhaler and QVAR daily  . Shortness of breath     with exertion and stress  . Dizziness   . Joint pain   . Joint swelling   . Chronic back pain     buldging disc  . Bruises easily   . Gastric ulcer   . GERD (gastroesophageal reflux disease)  takes Nexium daily  . Chronic constipation   . Urinary frequency   . Urinary incontinence   . Nocturia   . History of blood transfusion at age 89  . Early cataracts, bilateral   . Anxiety   . Depression     takes Xanax daily and Depakote bid  . History of shingles   . 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 Tobacco abuse 10/26/2012  . Chronic back pain   . Chronic knee pain   . Complication of anesthesia     AGGITATED  . Uterine cancer     "had some kind of cancer in my teens; had to have TAH to remove it"  . Headache(784.0)     "often here lately; weather changing"  . Pneumonia     "several times"  . Arthritis     "all over" (03/16/2014)  . Osteoarthritis of left knee 03/16/2014  . Alcohol withdrawal delirium 03/22/2014    Past Surgical History  Procedure Laterality Date  . Breast lumpectomy Right   . Breast biopsy Right ~ 1980  . Cholecystectomy  10+yrs ago  . Knee arthroscopy Bilateral   . Finger surgery Right     rt middle finger with pin in it  . Epidural injections    . Esophagogastroduodenoscopy    . Shoulder hemi-arthroplasty  05/06/2012    Procedure: SHOULDER HEMI-ARTHROPLASTY;  Surgeon: Johnny Bridge, MD;  Location: [facility];  Service: Orthopedics;  Laterality: Right;  . Joint replacement    . Fracture surgery      Right finger third digit  . Appendectomy  1981    with hysterectomy  . Total abdominal hysterectomy  1981    w/BSO  . Total knee  arthroplasty Left 03/16/2014    Procedure: LEFT TOTAL KNEE ARTHROPLASTY;  Surgeon: Johnny Bridge, MD;  Location: [facility];  Service: Orthopedics;  Laterality: Left;    Family History  Problem Relation Age of Onset  . Diabetes      Family History  . Hypertension      Family history   . Hypertension Mother   . Diabetes Mother   . Heart failure Mother   . Diabetes Father   . Cancer Sister 80    x2 (liver)  . Diabetes Brother   . Colon cancer Neg Hx   . Pancreatic disease Neg Hx     History   Social History  . Marital Status: Widowed    Spouse Name: N/A    Number of Children: N/A  . Years of Education: N/A   Occupational History  . Unemployed    Social History Main Topics  . Smoking status: Current Every Day Smoker -- 1.00 packs/day for 32 years    Types: Cigarettes  . Smokeless tobacco: Never Used  . Alcohol Use: 5.4 oz/week    9 Shots of liquor per week     Comment: 03/16/2014 "I drink brandy; 2-3 shots/day; a few days/wk"  . Drug Use: No     Comment: 03/16/2014 "last marijuana was 02/10/2014"  . Sexual Activity:    Partners: Male    Patent examiner Protection: Surgical   Other Topics Concern  . Not on file   Social History Narrative      ROS:  General: Negative for fever, chills, fatigue, weakness.see hpi Eyes: Negative for vision changes.  ENT: Negative for hoarseness, difficulty swallowing , nasal congestion. CV: Negative for chest pain, angina, palpitations, dyspnea on exertion, peripheral edema.  Respiratory: Negative for  dyspnea at rest, dyspnea on exertion, cough, sputum, wheezing.  GI: See history of present illness. GU:  Negative for dysuria, hematuria, urinary incontinence, urinary frequency, nocturnal urination.  MS: +joint pain, low back pain.  Derm: Negative for rash or itching.  Neuro: Negative for weakness, abnormal sensation, seizure, frequent headaches, memory loss, confusion.  Psych: Negative for anxiety, depression, suicidal ideation,  hallucinations.  Endo: see hpi Heme: Negative for bruising or bleeding. Allergy: Negative for rash or hives.    Physical Examination:  BP 105/80 mmHg  Temp(Src) 97.6 F (36.4 C) (Oral)  Ht 5\' 5"  (1.651 m)  Wt 164 lb 6.4 oz (74.571 kg)  BMI 27.36 kg/m2   General: Well-nourished, well-developed in no acute distress. Wants with walker.  Head: Normocephalic, atraumatic.   Eyes: Conjunctiva pink, no icterus. Mouth: Oropharyngeal mucosa moist and pink , no lesions erythema or exudate. Neck: Supple without thyromegaly, masses, or lymphadenopathy.  Lungs: Clear to auscultation bilaterally.  Heart: Regular rate and rhythm, no murmurs rubs or gallops.  Abdomen: Bowel sounds are normal, mild to moderate diffuse tenderness more pronounced in upper abdomen, nondistended, no hepatosplenomegaly or masses, no abdominal bruits or    hernia , no rebound or guarding.   Rectal: not performed Extremities: No lower extremity edema. No clubbing or deformities.  Neuro: Alert and oriented x 4 , grossly normal neurologically.  Skin: Warm and dry, no rash or jaundice.   Psych: Alert and cooperative, normal mood and affect.  Labs: Lab Results  Component Value Date   WBC 9.3 06/22/2014   HGB 13.1 06/22/2014   HCT 40.9 06/22/2014   MCV 83.1 06/22/2014   PLT 281 06/22/2014   Lab Results  Component Value Date   CREATININE 0.63 06/22/2014   BUN 6 06/22/2014   NA 136 06/22/2014   K 3.9 06/22/2014   CL 97 06/22/2014   CO2 24 06/22/2014   Lab Results  Component Value Date   ALT 9 06/22/2014   AST 21 06/22/2014   ALKPHOS 145* 06/22/2014   BILITOT 0.4 06/22/2014      Imaging Studies: No results found.

## 2014-09-30 NOTE — Op Note (Signed)
Patient came in to pre operative area answering questions alert to person place and procedure being performed, but very weak . Patient barely could keep eyes open.  Patient states she fell recently but could not give a date as to when she fell.  Proceeded to prepare for procedure, Dr. Patsey Berthold spoke with patient after his assessment ordered to give albuterol treatment. Ordered to give  Pre procedure medications. Patient still very lethargic, responds to name . Vital signs within normal limits.

## 2014-09-30 NOTE — Transfer of Care (Signed)
Immediate Anesthesia Transfer of Care Note  Patient: [name]  Procedure(s) Performed: Procedure(s): ESOPHAGOGASTRODUODENOSCOPY (EGD) WITH PROPOFOL (N/A) GASTRIC AND ESOPHAGEAL BIOPSY  Patient Location: PACU  Anesthesia Type:MAC  Level of Consciousness: awake and patient cooperative  Airway & Oxygen Therapy: Patient Spontanous Breathing and Patient connected to face mask oxygen  Post-op Assessment: Report given to PACU RN, Post -op Vital signs reviewed and stable and Patient moving all extremities  Post vital signs: Reviewed and stable  Complications: No apparent anesthesia complications

## 2014-09-30 NOTE — Anesthesia Postprocedure Evaluation (Signed)
  Anesthesia Post-op Note  Patient: [name], [name]  Procedure(s) Performed: Procedure(s): ESOPHAGOGASTRODUODENOSCOPY (EGD) WITH PROPOFOL (N/A) GASTRIC AND ESOPHAGEAL BIOPSY  Patient Location: PACU  Anesthesia Type:MAC  Level of Consciousness: awake, alert  and patient cooperative  Airway and Oxygen Therapy: Patient Spontanous Breathing  Post-op Pain: none  Post-op Assessment: Post-op Vital signs reviewed, Patient's Cardiovascular Status Stable, Respiratory Function Stable, Patent Airway and Pain level controlled  Post-op Vital Signs: Reviewed and stable  Last Vitals:  Filed Vitals:   09/30/14 1015  BP: 123/90  Pulse: 109  Temp:   Resp: 18    Complications: No apparent anesthesia complications

## 2014-09-30 NOTE — Discharge Instructions (Signed)

## 2014-10-01 ENCOUNTER — Encounter (HOSPITAL_COMMUNITY): Payer: Self-pay | Admitting: Internal Medicine

## 2014-10-04 ENCOUNTER — Other Ambulatory Visit: Payer: Self-pay | Admitting: *Deleted

## 2014-10-07 ENCOUNTER — Encounter: Payer: Self-pay | Admitting: Internal Medicine

## 2014-10-07 ENCOUNTER — Telehealth: Payer: Self-pay

## 2014-10-07 NOTE — Telephone Encounter (Signed)
Per RMR- he spoke with Dr.Hatcher, ok to rx diflucan 100mg  qd x 2 weeks no refills. I tried to call the pt. She was sleeping. Left a message with a family member to return call.

## 2014-10-07 NOTE — Telephone Encounter (Signed)
-----   Message from Daneil Dolin, MD sent at 10/07/2014  7:29 AM EST ----- Hello Dr Johnnye Sima:  PAS stains came back positive for candida (endoscopically, didn't look like this at all).  Researched drug interactions and spoke with Nicki Reaper in our pharmacy.  Due to potential interaction with ritonavir (prolongation of QT), it was recommended to dose down away from weight based recommendations for Diflucan.  Specifically,  go with 100 mg daily x 14 days wo load.  This would be about half of weight based dosing.  Unless you have other recommendations, I am going to prescribe this regimen for this lady later today. Thanks for your attention.

## 2014-10-11 ENCOUNTER — Ambulatory Visit: Payer: Medicare Other | Admitting: Infectious Diseases

## 2014-10-12 ENCOUNTER — Emergency Department (HOSPITAL_COMMUNITY): Payer: Medicare Other

## 2014-10-12 ENCOUNTER — Inpatient Hospital Stay (HOSPITAL_COMMUNITY)
Admission: EM | Admit: 2014-10-12 | Discharge: 2014-11-05 | DRG: 974 | Disposition: A | Discharge status: Skilled Nursing Facility | Payer: Medicare Other | Attending: Internal Medicine | Admitting: Internal Medicine

## 2014-10-12 ENCOUNTER — Encounter (HOSPITAL_COMMUNITY): Payer: Self-pay

## 2014-10-12 DIAGNOSIS — B3781 Candidal esophagitis: Secondary | ICD-10-CM | POA: Diagnosis present

## 2014-10-12 DIAGNOSIS — Z79891 Long term (current) use of opiate analgesic: Secondary | ICD-10-CM

## 2014-10-12 DIAGNOSIS — R159 Full incontinence of feces: Secondary | ICD-10-CM | POA: Diagnosis not present

## 2014-10-12 DIAGNOSIS — R0902 Hypoxemia: Secondary | ICD-10-CM

## 2014-10-12 DIAGNOSIS — B20 Human immunodeficiency virus [HIV] disease: Secondary | ICD-10-CM | POA: Diagnosis present

## 2014-10-12 DIAGNOSIS — N179 Acute kidney failure, unspecified: Secondary | ICD-10-CM | POA: Diagnosis not present

## 2014-10-12 DIAGNOSIS — H269 Unspecified cataract: Secondary | ICD-10-CM | POA: Diagnosis present

## 2014-10-12 DIAGNOSIS — J45909 Unspecified asthma, uncomplicated: Secondary | ICD-10-CM | POA: Diagnosis present

## 2014-10-12 DIAGNOSIS — F419 Anxiety disorder, unspecified: Secondary | ICD-10-CM | POA: Diagnosis not present

## 2014-10-12 DIAGNOSIS — J9600 Acute respiratory failure, unspecified whether with hypoxia or hypercapnia: Secondary | ICD-10-CM

## 2014-10-12 DIAGNOSIS — D638 Anemia in other chronic diseases classified elsewhere: Secondary | ICD-10-CM | POA: Diagnosis not present

## 2014-10-12 DIAGNOSIS — B9620 Unspecified Escherichia coli [E. coli] as the cause of diseases classified elsewhere: Secondary | ICD-10-CM | POA: Diagnosis present

## 2014-10-12 DIAGNOSIS — B3749 Other urogenital candidiasis: Secondary | ICD-10-CM | POA: Diagnosis not present

## 2014-10-12 DIAGNOSIS — E872 Acidosis: Secondary | ICD-10-CM | POA: Diagnosis present

## 2014-10-12 DIAGNOSIS — A419 Sepsis, unspecified organism: Principal | ICD-10-CM | POA: Diagnosis present

## 2014-10-12 DIAGNOSIS — J690 Pneumonitis due to inhalation of food and vomit: Secondary | ICD-10-CM | POA: Diagnosis not present

## 2014-10-12 DIAGNOSIS — Z1624 Resistance to multiple antibiotics: Secondary | ICD-10-CM | POA: Diagnosis present

## 2014-10-12 DIAGNOSIS — Z4659 Encounter for fitting and adjustment of other gastrointestinal appliance and device: Secondary | ICD-10-CM

## 2014-10-12 DIAGNOSIS — R29898 Other symptoms and signs involving the musculoskeletal system: Secondary | ICD-10-CM

## 2014-10-12 DIAGNOSIS — F32A Depression, unspecified: Secondary | ICD-10-CM | POA: Diagnosis present

## 2014-10-12 DIAGNOSIS — R6520 Severe sepsis without septic shock: Secondary | ICD-10-CM | POA: Diagnosis present

## 2014-10-12 DIAGNOSIS — J9690 Respiratory failure, unspecified, unspecified whether with hypoxia or hypercapnia: Secondary | ICD-10-CM

## 2014-10-12 DIAGNOSIS — R4182 Altered mental status, unspecified: Secondary | ICD-10-CM

## 2014-10-12 DIAGNOSIS — F17210 Nicotine dependence, cigarettes, uncomplicated: Secondary | ICD-10-CM | POA: Diagnosis present

## 2014-10-12 DIAGNOSIS — Z8542 Personal history of malignant neoplasm of other parts of uterus: Secondary | ICD-10-CM

## 2014-10-12 DIAGNOSIS — Z79899 Other long term (current) drug therapy: Secondary | ICD-10-CM | POA: Diagnosis not present

## 2014-10-12 DIAGNOSIS — D649 Anemia, unspecified: Secondary | ICD-10-CM | POA: Diagnosis present

## 2014-10-12 DIAGNOSIS — K861 Other chronic pancreatitis: Secondary | ICD-10-CM | POA: Diagnosis present

## 2014-10-12 DIAGNOSIS — E875 Hyperkalemia: Secondary | ICD-10-CM

## 2014-10-12 DIAGNOSIS — K219 Gastro-esophageal reflux disease without esophagitis: Secondary | ICD-10-CM | POA: Diagnosis present

## 2014-10-12 DIAGNOSIS — R140 Abdominal distension (gaseous): Secondary | ICD-10-CM

## 2014-10-12 DIAGNOSIS — R410 Disorientation, unspecified: Secondary | ICD-10-CM

## 2014-10-12 DIAGNOSIS — E8770 Fluid overload, unspecified: Secondary | ICD-10-CM | POA: Diagnosis not present

## 2014-10-12 DIAGNOSIS — E876 Hypokalemia: Secondary | ICD-10-CM | POA: Diagnosis not present

## 2014-10-12 DIAGNOSIS — F319 Bipolar disorder, unspecified: Secondary | ICD-10-CM | POA: Diagnosis not present

## 2014-10-12 DIAGNOSIS — B965 Pseudomonas (aeruginosa) (mallei) (pseudomallei) as the cause of diseases classified elsewhere: Secondary | ICD-10-CM | POA: Diagnosis not present

## 2014-10-12 DIAGNOSIS — R1110 Vomiting, unspecified: Secondary | ICD-10-CM | POA: Diagnosis present

## 2014-10-12 DIAGNOSIS — R509 Fever, unspecified: Secondary | ICD-10-CM

## 2014-10-12 DIAGNOSIS — A0472 Enterocolitis due to Clostridium difficile, not specified as recurrent: Secondary | ICD-10-CM | POA: Diagnosis present

## 2014-10-12 DIAGNOSIS — N19 Unspecified kidney failure: Secondary | ICD-10-CM

## 2014-10-12 DIAGNOSIS — R32 Unspecified urinary incontinence: Secondary | ICD-10-CM | POA: Diagnosis not present

## 2014-10-12 DIAGNOSIS — M179 Osteoarthritis of knee, unspecified: Secondary | ICD-10-CM | POA: Diagnosis not present

## 2014-10-12 DIAGNOSIS — J449 Chronic obstructive pulmonary disease, unspecified: Secondary | ICD-10-CM | POA: Diagnosis present

## 2014-10-12 DIAGNOSIS — G9340 Encephalopathy, unspecified: Secondary | ICD-10-CM

## 2014-10-12 DIAGNOSIS — R042 Hemoptysis: Secondary | ICD-10-CM

## 2014-10-12 DIAGNOSIS — A498 Other bacterial infections of unspecified site: Secondary | ICD-10-CM | POA: Diagnosis present

## 2014-10-12 DIAGNOSIS — A047 Enterocolitis due to Clostridium difficile: Secondary | ICD-10-CM | POA: Diagnosis present

## 2014-10-12 DIAGNOSIS — I272 Other secondary pulmonary hypertension: Secondary | ICD-10-CM | POA: Diagnosis not present

## 2014-10-12 DIAGNOSIS — E43 Unspecified severe protein-calorie malnutrition: Secondary | ICD-10-CM | POA: Diagnosis not present

## 2014-10-12 DIAGNOSIS — Z978 Presence of other specified devices: Secondary | ICD-10-CM

## 2014-10-12 DIAGNOSIS — R339 Retention of urine, unspecified: Secondary | ICD-10-CM

## 2014-10-12 DIAGNOSIS — I10 Essential (primary) hypertension: Secondary | ICD-10-CM

## 2014-10-12 DIAGNOSIS — J9601 Acute respiratory failure with hypoxia: Secondary | ICD-10-CM | POA: Diagnosis present

## 2014-10-12 DIAGNOSIS — Z21 Asymptomatic human immunodeficiency virus [HIV] infection status: Secondary | ICD-10-CM

## 2014-10-12 DIAGNOSIS — J9811 Atelectasis: Secondary | ICD-10-CM | POA: Diagnosis present

## 2014-10-12 DIAGNOSIS — J189 Pneumonia, unspecified organism: Secondary | ICD-10-CM

## 2014-10-12 DIAGNOSIS — R0602 Shortness of breath: Secondary | ICD-10-CM

## 2014-10-12 DIAGNOSIS — K567 Ileus, unspecified: Secondary | ICD-10-CM

## 2014-10-12 DIAGNOSIS — F329 Major depressive disorder, single episode, unspecified: Secondary | ICD-10-CM | POA: Diagnosis present

## 2014-10-12 DIAGNOSIS — N390 Urinary tract infection, site not specified: Secondary | ICD-10-CM | POA: Diagnosis present

## 2014-10-12 LAB — PROTIME-INR
INR: 1.13 (ref 0.00–1.49)
Prothrombin Time: 14.6 seconds (ref 11.6–15.2)

## 2014-10-12 LAB — COMPREHENSIVE METABOLIC PANEL
ALT: 21 U/L (ref 0–35)
AST: 59 U/L — ABNORMAL HIGH (ref 0–37)
Albumin: 2.6 g/dL — ABNORMAL LOW (ref 3.5–5.2)
Alkaline Phosphatase: 135 U/L — ABNORMAL HIGH (ref 39–117)
Anion gap: 19 — ABNORMAL HIGH (ref 5–15)
BILIRUBIN TOTAL: 2.2 mg/dL — AB (ref 0.3–1.2)
BUN: 99 mg/dL — AB (ref 6–23)
CO2: 15 mmol/L — AB (ref 19–32)
CREATININE: 11.63 mg/dL — AB (ref 0.50–1.10)
Calcium: 8.9 mg/dL (ref 8.4–10.5)
Chloride: 92 mEq/L — ABNORMAL LOW (ref 96–112)
GFR calc Af Amer: 4 mL/min — ABNORMAL LOW (ref >90)
GFR, EST NON AFRICAN AMERICAN: 3 mL/min — AB (ref >90)
GLUCOSE: 74 mg/dL (ref 70–99)
Potassium: 7.9 mmol/L (ref 3.5–5.1)
Sodium: 126 mmol/L — ABNORMAL LOW (ref 135–145)
Total Protein: 8.5 g/dL — ABNORMAL HIGH (ref 6.0–8.3)

## 2014-10-12 LAB — BASIC METABOLIC PANEL
Anion gap: 17 — ABNORMAL HIGH (ref 5–15)
Anion gap: 15 (ref 5–15)
BUN: 76 mg/dL — ABNORMAL HIGH (ref 6–23)
BUN: 58 mg/dL — ABNORMAL HIGH (ref 6–23)
CALCIUM: 8.9 mg/dL (ref 8.4–10.5)
CHLORIDE: 99 meq/L (ref 96–112)
CHLORIDE: 100 meq/L (ref 96–112)
CO2: 15 mmol/L — AB (ref 19–32)
CO2: 15 mmol/L — AB (ref 19–32)
CREATININE: 3.75 mg/dL — AB (ref 0.50–1.10)
Calcium: 8.8 mg/dL (ref 8.4–10.5)
Creatinine, Ser: 6.99 mg/dL — ABNORMAL HIGH (ref 0.50–1.10)
GFR calc Af Amer: 7 mL/min — ABNORMAL LOW (ref >90)
GFR calc Af Amer: 15 mL/min — ABNORMAL LOW (ref >90)
GFR calc non Af Amer: 6 mL/min — ABNORMAL LOW (ref >90)
GFR calc non Af Amer: 13 mL/min — ABNORMAL LOW (ref >90)
GLUCOSE: 98 mg/dL (ref 70–99)
Glucose, Bld: 149 mg/dL — ABNORMAL HIGH (ref 70–99)
Potassium: 5.5 mmol/L — ABNORMAL HIGH (ref 3.5–5.1)
Potassium: 4.2 mmol/L (ref 3.5–5.1)
SODIUM: 131 mmol/L — AB (ref 135–145)
Sodium: 130 mmol/L — ABNORMAL LOW (ref 135–145)

## 2014-10-12 LAB — URINE MICROSCOPIC-ADD ON

## 2014-10-12 LAB — CBC WITH DIFFERENTIAL/PLATELET
Basophils Absolute: 0 10*3/uL (ref 0.0–0.1)
Basophils Relative: 0 % (ref 0–1)
EOS PCT: 0 % (ref 0–5)
Eosinophils Absolute: 0 10*3/uL (ref 0.0–0.7)
HEMATOCRIT: 32.3 % — AB (ref 36.0–46.0)
Hemoglobin: 10.8 g/dL — ABNORMAL LOW (ref 12.0–15.0)
LYMPHS ABS: 1.4 10*3/uL (ref 0.7–4.0)
Lymphocytes Relative: 10 % — ABNORMAL LOW (ref 12–46)
MCH: 29 pg (ref 26.0–34.0)
MCHC: 33.4 g/dL (ref 30.0–36.0)
MCV: 86.8 fL (ref 78.0–100.0)
MONO ABS: 1.5 10*3/uL — AB (ref 0.1–1.0)
Monocytes Relative: 11 % (ref 3–12)
NEUTROS ABS: 10.4 10*3/uL — AB (ref 1.7–7.7)
Neutrophils Relative %: 79 % — ABNORMAL HIGH (ref 43–77)
Platelets: 292 10*3/uL (ref 150–400)
RBC: 3.72 MIL/uL — ABNORMAL LOW (ref 3.87–5.11)
RDW: 13.3 % (ref 11.5–15.5)
WBC: 13.2 10*3/uL — ABNORMAL HIGH (ref 4.0–10.5)

## 2014-10-12 LAB — URINALYSIS, ROUTINE W REFLEX MICROSCOPIC
GLUCOSE, UA: NEGATIVE mg/dL
Leukocytes, UA: NEGATIVE
Nitrite: NEGATIVE
Protein, ur: 100 mg/dL — AB
Specific Gravity, Urine: 1.025 (ref 1.005–1.030)
Urobilinogen, UA: 1 mg/dL (ref 0.0–1.0)
pH: 6 (ref 5.0–8.0)

## 2014-10-12 LAB — TYPE AND SCREEN
ABO/RH(D): A POS
Antibody Screen: NEGATIVE

## 2014-10-12 LAB — ETHANOL: Alcohol, Ethyl (B): <5 mg/dL (ref 0–9)

## 2014-10-12 LAB — CBC
HEMATOCRIT: 33.6 % — AB (ref 36.0–46.0)
HEMOGLOBIN: 11.6 g/dL — AB (ref 12.0–15.0)
MCH: 29.6 pg (ref 26.0–34.0)
MCHC: 34.5 g/dL (ref 30.0–36.0)
MCV: 85.7 fL (ref 78.0–100.0)
Platelets: 315 10*3/uL (ref 150–400)
RBC: 3.92 MIL/uL (ref 3.87–5.11)
RDW: 13.3 % (ref 11.5–15.5)
WBC: 15.1 10*3/uL — AB (ref 4.0–10.5)

## 2014-10-12 LAB — TROPONIN I: Troponin I: 0.03 ng/mL (ref <0.031)

## 2014-10-12 LAB — LACTIC ACID, PLASMA: Lactic Acid, Venous: 1.9 mmol/L (ref 0.5–2.2)

## 2014-10-12 LAB — AMMONIA: Ammonia: 31 umol/L (ref 11–32)

## 2014-10-12 LAB — LIPASE, BLOOD: Lipase: 15 U/L (ref 11–59)

## 2014-10-12 MED ORDER — NAPHAZOLINE HCL 0.1 % OP SOLN
1.0000 [drp] | Freq: Four times a day (QID) | OPHTHALMIC | Status: DC | PRN
  Filled 2014-10-12: qty 15

## 2014-10-12 MED ORDER — ONDANSETRON HCL 4 MG/2ML IJ SOLN
4.0000 mg | Freq: Four times a day (QID) | INTRAMUSCULAR | Status: DC | PRN
  Administered 2014-10-13 – 2014-10-20 (×10): 4 mg via INTRAVENOUS
  Filled 2014-10-12 (×10): qty 2

## 2014-10-12 MED ORDER — SODIUM CHLORIDE 0.9 % IV BOLUS (SEPSIS)
250.0000 mL | Freq: Once | INTRAVENOUS | Status: AC
  Administered 2014-10-12: 250 mL via INTRAVENOUS

## 2014-10-12 MED ORDER — SODIUM BICARBONATE 8.4 % IV SOLN: INTRAVENOUS | Status: DC

## 2014-10-12 MED ORDER — DEXTROSE-NACL 5-0.9 % IV SOLN
INTRAVENOUS | Status: DC
Start: 2014-10-12 — End: 2014-10-12
  Administered 2014-10-12: 20:00:00 via INTRAVENOUS

## 2014-10-12 MED ORDER — SODIUM CHLORIDE 0.9 % IV SOLN
1.0000 g | Freq: Once | INTRAVENOUS | Status: AC
  Administered 2014-10-12: 1 g via INTRAVENOUS
  Filled 2014-10-12: qty 10

## 2014-10-12 MED ORDER — SODIUM POLYSTYRENE SULFONATE 15 GM/60ML PO SUSP
45.0000 g | Freq: Once | ORAL | Status: AC
  Administered 2014-10-12: 45 g via ORAL
  Filled 2014-10-12: qty 180

## 2014-10-12 MED ORDER — ENOXAPARIN SODIUM 30 MG/0.3ML ~~LOC~~ SOLN
30.0000 mg | SUBCUTANEOUS | Status: DC
  Administered 2014-10-12 – 2014-10-13 (×2): 30 mg via SUBCUTANEOUS
  Filled 2014-10-12 (×2): qty 0.3

## 2014-10-12 MED ORDER — ONDANSETRON HCL 4 MG PO TABS
4.0000 mg | ORAL_TABLET | Freq: Four times a day (QID) | ORAL | Status: DC | PRN
  Administered 2014-10-13 – 2014-10-18 (×3): 4 mg via ORAL
  Filled 2014-10-12 (×3): qty 1

## 2014-10-12 MED ORDER — SODIUM BICARBONATE 8.4 % IV SOLN
INTRAVENOUS | Status: AC
  Filled 2014-10-12: qty 50

## 2014-10-12 MED ORDER — SODIUM BICARBONATE 8.4 % IV SOLN
INTRAVENOUS | Status: DC
  Administered 2014-10-13: 01:00:00 via INTRAVENOUS
  Filled 2014-10-12 (×2): qty 1000

## 2014-10-12 MED ORDER — SODIUM CHLORIDE 0.9 % IV SOLN: INTRAVENOUS | Status: DC

## 2014-10-12 MED ORDER — SODIUM CHLORIDE 0.9 % IJ SOLN
3.0000 mL | Freq: Two times a day (BID) | INTRAMUSCULAR | Status: DC
  Administered 2014-10-13 – 2014-10-20 (×9): 3 mL via INTRAVENOUS

## 2014-10-12 MED ORDER — DEXTROSE-NACL 5-0.45 % IV SOLN: INTRAVENOUS | Status: DC

## 2014-10-12 MED ORDER — METOPROLOL TARTRATE 1 MG/ML IV SOLN
2.5000 mg | Freq: Once | INTRAVENOUS | Status: AC
  Administered 2014-10-13: 2.5 mg via INTRAVENOUS
  Filled 2014-10-12: qty 5

## 2014-10-12 NOTE — Telephone Encounter (Signed)
Tried to call pt- NA 

## 2014-10-12 NOTE — Progress Notes (Signed)
Name: Ziyon Cedotal MRN: [id number] DOB: [birth year]  ELECTRONIC ICU PHYSICIAN NOTE  Problem:  53 yo female with HIV presented with n and v and found to have severe ARF with hyperkalemia and a residual bladder vol in excess of 6 liters c/w obst uropathy and at risk of post obst excess diuresis    Intake/Output Summary (Last 24 hours) at 10/12/14 2053 Last data filed at 10/12/14 1700  Gross per 24 hour  Intake   1500 ml  Output   6800 ml  Net  -5300 ml     Intervention:  rec aggressive IV fluids to maintain renal perfusion - will monitor   Christinia Gully 10/12/2014, 8:52 PM

## 2014-10-12 NOTE — ED Provider Notes (Signed)
CSN: [id number]     Arrival date & time 10/12/14  1426 History   First MD Initiated Contact with Patient 10/12/14 1456     Chief Complaint  Patient presents with  . Hematemesis     (Consider location/radiation/quality/duration/timing/severity/associated sxs/prior Treatment) The history is provided by the EMS personnel and a caregiver. The history is limited by the condition of the patient.   level V caveat applies due to patient's altered mental status. Patient came in by EMS CNA sent patient in for evaluation of vomiting. Appear to be dried blood on her lips. CNA did not have a lot of other specific details. Patient was nonverbal.    Patient is from home.  Past Medical History  Diagnosis Date  . Baker's cyst   . Pulmonary lesion 1/03    on RUL onn CT   . Hemorrhoids   . Zoster 2001  . Family history of diabetes mellitus   . Tobacco user   . Other abnormal Papanicolaou smear of cervix and cervical HPV(795.09)   . Herpes zoster   . Hemorrhoids   . Baker's cyst   . Pancreatitis   . HIV disease     take Prezista,Truvada,Norvir,and Isentress daily  . Bipolar disorder     With hx of psychotic features.  . Articular cartilage disorder of knee   . Bursitis of shoulder   . Rotator cuff arthropathy   . Hypertension     takes Clonidine and Verapamil daily  . Emphysema   . Asthma     Albuterol resue inhaler and QVAR daily  . Shortness of breath     with exertion and stress  . Dizziness   . Joint pain   . Joint swelling   . Chronic back pain     buldging disc  . Bruises easily   . Gastric ulcer   . GERD (gastroesophageal reflux disease)     takes Nexium daily  . Chronic constipation   . Urinary frequency   . Urinary incontinence   . Nocturia   . History of blood transfusion at age 7  . Early cataracts, bilateral   . Anxiety   . Depression     takes Xanax daily and Depakote bid  . History of shingles   . 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 Tobacco abuse 10/26/2012  . Chronic back pain   . Chronic knee pain   . Complication of anesthesia     AGGITATED  . Uterine cancer     "had some kind of cancer in my teens; had to have TAH to remove it"  . Headache(784.0)     "often here lately; weather changing"  . Pneumonia     "several times"  . Arthritis     "all over" (03/16/2014)  . Osteoarthritis of left knee 03/16/2014  . Alcohol withdrawal delirium 03/22/2014   Past Surgical History  Procedure Laterality Date  . Breast lumpectomy Right   . Breast biopsy Right ~ 1980  . Cholecystectomy  10+yrs ago  . Knee arthroscopy Left   . Finger surgery Right     rt middle finger with pin in it  . Epidural injections    . Esophagogastroduodenoscopy    . Shoulder hemi-arthroplasty  05/06/2012    Procedure: SHOULDER HEMI-ARTHROPLASTY;  Surgeon: Johnny Bridge, MD;  Location: [place];  Service: Orthopedics;  Laterality: Right;  . Joint replacement    . Fracture surgery      Right finger third digit  .  Appendectomy  1981    with hysterectomy  . Total knee arthroplasty Left 03/16/2014    Procedure: LEFT TOTAL KNEE ARTHROPLASTY;  Surgeon: Johnny Bridge, MD;  Location: [place];  Service: Orthopedics;  Laterality: Left;  . Total abdominal hysterectomy  1981    w/BSO  . Esophagogastroduodenoscopy (egd) with propofol N/A 09/30/2014    Procedure: ESOPHAGOGASTRODUODENOSCOPY (EGD) WITH PROPOFOL;  Surgeon: Daneil Dolin, MD;  Location: [facility];  Service: Endoscopy;  Laterality: N/A;  . Esophageal biopsy  09/30/2014    Procedure: GASTRIC AND ESOPHAGEAL BIOPSY;  Surgeon: Daneil Dolin, MD;  Location: [facility];  Service: Endoscopy;;   Family History  Problem Relation Age of Onset  . Diabetes      Family History  . Hypertension      Family history   . Hypertension Mother   . Diabetes Mother   . Heart failure Mother   . Diabetes Father   . Cancer Sister 34    x2 (liver)  . Diabetes Brother   . Colon cancer Neg Hx   .  Pancreatic disease Neg Hx    History  Substance Use Topics  . Smoking status: Current Every Day Smoker -- 1.00 packs/day for 32 years    Types: Cigarettes  . Smokeless tobacco: Never Used  . Alcohol Use: 5.4 oz/week    9 Shots of liquor per week     Comment: 03/16/2014 "I drink brandy; 2-3 shots/day; a few days/wk"   OB History    No data available     Review of Systems  Unable to perform ROS  level V caveat applies due to altered mental status.    Allergies  Aspirin and Sulfonamide derivatives  Home Medications   Prior to Admission medications   Medication Sig Start Date End Date Taking? Authorizing Provider  albuterol (PROVENTIL HFA;VENTOLIN HFA) 108 (90 BASE) MCG/ACT inhaler Inhale 2 puffs into the lungs every 4 (four) hours as needed for shortness of breath.  12/31/11  Yes Fayrene Helper, MD  albuterol (PROVENTIL) (2.5 MG/3ML) 0.083% nebulizer solution Take 2.5 mg by nebulization every 6 (six) hours as needed for wheezing or shortness of breath.   Yes Historical Provider, MD  ALPRAZolam Duanne Moron) 1 MG tablet Take one tablet by mouth twice daily as needed for anxiety Patient taking differently: Take 1 mg by mouth 2 (two) times daily as needed for anxiety. Take one tablet by mouth twice daily as needed for anxiety 05/24/14  Yes Tiffany L Reed, DO  esomeprazole (NEXIUM) 40 MG capsule Take 40 mg by mouth daily before breakfast. 05/02/11  Yes Fayrene Helper, MD  lubiprostone (AMITIZA) 8 MCG capsule Take 1 capsule (8 mcg total) by mouth 2 (two) times daily with a meal. 09/22/14  Yes Mahala Menghini, PA-C  oxybutynin (DITROPAN-XL) 10 MG 24 hr tablet Take 10 mg by mouth 2 (two) times daily.   Yes Historical Provider, MD  oxyCODONE-acetaminophen (PERCOCET) 7.5-325 MG per tablet Take 1 tablet by mouth 4 (four) times daily as needed for pain (for 30 days).  09/13/14  Yes Historical Provider, MD  traMADol (ULTRAM) 50 MG tablet Take 50-100 mg by mouth every 6 (six) hours as needed for  moderate pain.   Yes Historical Provider, MD  verapamil (CALAN-SR) 240 MG CR tablet Take 240 mg by mouth daily.   Yes Historical Provider, MD  zolpidem (AMBIEN) 5 MG tablet Take one tablet by mouth at bedtime as needed for sleep Patient taking differently: Take 5 mg  by mouth at bedtime as needed for sleep. Take one tablet by mouth at bedtime as needed for sleep 05/20/14  Yes Tiffany L Reed, DO  cetirizine (ZYRTEC) 10 MG tablet Take 10 mg by mouth at bedtime.     Historical Provider, MD  cloNIDine (CATAPRES) 0.2 MG tablet Take 0.1 mg by mouth at bedtime.    Historical Provider, MD  cyanocobalamin (,VITAMIN B-12,) 1000 MCG/ML injection Inject 1,000 mcg into the muscle every 30 (thirty) days.    Historical Provider, MD  Darunavir Ethanolate (PREZISTA) 800 MG tablet Take 800 mg by mouth daily with breakfast.    Historical Provider, MD  diazepam (VALIUM) 5 MG tablet Take 5 mg by mouth daily as needed for muscle spasms (10 day course starting on 08/16/2014).  08/16/14   Historical Provider, MD  diphenoxylate-atropine (LOMOTIL) 2.5-0.025 MG per tablet Take 1 tablet by mouth 4 (four) times daily as needed for diarrhea or loose stools. 03/23/14   Blanchie Serve, MD  emtricitabine-tenofovir (TRUVADA) 200-300 MG per tablet Take 1 tablet by mouth daily. 06/29/14   Campbell Riches, MD  folic acid (FOLVITE) 1 MG tablet Take 1 mg by mouth daily.    Historical Provider, MD  levofloxacin (LEVAQUIN) 500 MG tablet Take 500 mg by mouth daily.  09/14/14   Historical Provider, MD  methocarbamol (ROBAXIN) 750 MG tablet Take 1 tablet (750 mg total) by mouth every 6 (six) hours as needed for muscle spasms. 03/16/14   Johnny Bridge, MD  ondansetron (ZOFRAN) 4 MG tablet Take 4 mg by mouth every 8 (eight) hours as needed for nausea or vomiting.    Historical Provider, MD  potassium chloride (K-DUR) 10 MEQ tablet Take 10 mEq by mouth daily.    Historical Provider, MD  promethazine (PHENERGAN) 25 MG tablet Take 25 mg by mouth every  6 (six) hours as needed for nausea.    Historical Provider, MD  QVAR 80 MCG/ACT inhaler Inhale 2 puffs into the lungs 2 (two) times daily. 09/03/14   Historical Provider, MD  raltegravir (ISENTRESS) 400 MG tablet Take 1 tablet (400 mg total) by mouth 2 (two) times daily. 08/19/14   Campbell Riches, MD  ritonavir (NORVIR) 100 MG TABS tablet Take 100 mg by mouth daily with breakfast.    Historical Provider, MD  tetrahydrozoline 0.05 % ophthalmic solution Place 1 drop into both eyes 2 (two) times daily as needed.    Historical Provider, MD  tiotropium (SPIRIVA) 18 MCG inhalation capsule Place 18 mcg into inhaler and inhale daily.    Historical Provider, MD  Vitamin D, Ergocalciferol, (DRISDOL) 50000 UNITS CAPS Take 50,000 Units by mouth every 7 (seven) days. Take on mondays    Historical Provider, MD   BP 135/103 mmHg  Pulse 121  Temp(Src) 99.3 F (37.4 C) (Core (Comment))  Resp 16  Ht 5\' 5"  (1.651 m)  Wt 145 lb 11.6 oz (66.1 kg)  BMI 24.25 kg/m2  SpO2 100% Physical Exam  Constitutional: She appears well-developed and well-nourished. She appears distressed.  HENT:  Head: Normocephalic and atraumatic.  Dried blood on the lips. Mucous membranes dry.  Eyes: Conjunctivae are normal.  Neck: Neck supple.  Cardiovascular: Regular rhythm.   Tachycardic  Pulmonary/Chest: Effort normal and breath sounds normal. No respiratory distress.  Abdominal: Soft. She exhibits distension. She exhibits no mass. There is tenderness.  Neurological: She is alert. She exhibits normal muscle tone. Coordination normal.  Awake but nonverbal. Moving all 4 extremities.  Skin: Skin is warm. No  rash noted.  Nursing note and vitals reviewed.   ED Course  Procedures (including critical care time) Labs Review Labs Reviewed  CBC - Abnormal; Notable for the following:    WBC 15.1 (*)    Hemoglobin 11.6 (*)    HCT 33.6 (*)    All other components within normal limits  COMPREHENSIVE METABOLIC PANEL - Abnormal;  Notable for the following:    Sodium 126 (*)    Potassium 7.9 (*)    Chloride 92 (*)    CO2 15 (*)    BUN 99 (*)    Creatinine, Ser 11.63 (*)    Total Protein 8.5 (*)    Albumin 2.6 (*)    AST 59 (*)    Alkaline Phosphatase 135 (*)    Total Bilirubin 2.2 (*)    GFR calc non Af Amer 3 (*)    GFR calc Af Amer 4 (*)    Anion gap 19 (*)    All other components within normal limits  URINALYSIS, ROUTINE W REFLEX MICROSCOPIC - Abnormal; Notable for the following:    Color, Urine AMBER (*)    APPearance CLOUDY (*)    Hgb urine dipstick LARGE (*)    Bilirubin Urine SMALL (*)    Ketones, ur TRACE (*)    Protein, ur 100 (*)    All other components within normal limits  URINE MICROSCOPIC-ADD ON - Abnormal; Notable for the following:    Squamous Epithelial / LPF MANY (*)    Bacteria, UA FEW (*)    All other components within normal limits  BASIC METABOLIC PANEL - Abnormal; Notable for the following:    Sodium 131 (*)    Potassium 5.5 (*)    CO2 15 (*)    BUN 76 (*)    Creatinine, Ser 6.99 (*)    GFR calc non Af Amer 6 (*)    GFR calc Af Amer 7 (*)    Anion gap 17 (*)    All other components within normal limits  CBC WITH DIFFERENTIAL - Abnormal; Notable for the following:    WBC 13.2 (*)    RBC 3.72 (*)    Hemoglobin 10.8 (*)    HCT 32.3 (*)    Neutrophils Relative % 79 (*)    Neutro Abs 10.4 (*)    Lymphocytes Relative 10 (*)    Monocytes Absolute 1.5 (*)    All other components within normal limits  CULTURE, BLOOD (ROUTINE X 2)  CULTURE, BLOOD (ROUTINE X 2)  URINE CULTURE  MRSA PCR SCREENING  LACTIC ACID, PLASMA  LIPASE, BLOOD  ETHANOL  AMMONIA  PROTIME-INR  TROPONIN I  BASIC METABOLIC PANEL  COMPREHENSIVE METABOLIC PANEL  CBC  I-STAT CHEM 8, ED  I-STAT TROPOININ, ED  TYPE AND SCREEN   Results for orders placed or performed during the hospital encounter of 10/12/14  CBC  Result Value Ref Range   WBC 15.1 (H) 4.0 - 10.5 K/uL   RBC 3.92 3.87 - 5.11 MIL/uL    Hemoglobin 11.6 (L) 12.0 - 15.0 g/dL   HCT 33.6 (L) 36.0 - 46.0 %   MCV 85.7 78.0 - 100.0 fL   MCH 29.6 26.0 - 34.0 pg   MCHC 34.5 30.0 - 36.0 g/dL   RDW 13.3 11.5 - 15.5 %   Platelets 315 150 - 400 K/uL  Comprehensive metabolic panel  Result Value Ref Range   Sodium 126 (L) 135 - 145 mmol/L   Potassium 7.9 (HH) 3.5 - 5.1  mmol/L   Chloride 92 (L) 96 - 112 mEq/L   CO2 15 (L) 19 - 32 mmol/L   Glucose, Bld 74 70 - 99 mg/dL   BUN 99 (H) 6 - 23 mg/dL   Creatinine, Ser 11.63 (H) 0.50 - 1.10 mg/dL   Calcium 8.9 8.4 - 10.5 mg/dL   Total Protein 8.5 (H) 6.0 - 8.3 g/dL   Albumin 2.6 (L) 3.5 - 5.2 g/dL   AST 59 (H) 0 - 37 U/L   ALT 21 0 - 35 U/L   Alkaline Phosphatase 135 (H) 39 - 117 U/L   Total Bilirubin 2.2 (H) 0.3 - 1.2 mg/dL   GFR calc non Af Amer 3 (L) >90 mL/min   GFR calc Af Amer 4 (L) >90 mL/min   Anion gap 19 (H) 5 - 15  Urinalysis, Routine w reflex microscopic  Result Value Ref Range   Color, Urine AMBER (A) YELLOW   APPearance CLOUDY (A) CLEAR   Specific Gravity, Urine 1.025 1.005 - 1.030   pH 6.0 5.0 - 8.0   Glucose, UA NEGATIVE NEGATIVE mg/dL   Hgb urine dipstick LARGE (A) NEGATIVE   Bilirubin Urine SMALL (A) NEGATIVE   Ketones, ur TRACE (A) NEGATIVE mg/dL   Protein, ur 100 (A) NEGATIVE mg/dL   Urobilinogen, UA 1.0 0.0 - 1.0 mg/dL   Nitrite NEGATIVE NEGATIVE   Leukocytes, UA NEGATIVE NEGATIVE  Lactic acid, plasma  Result Value Ref Range   Lactic Acid, Venous 1.9 0.5 - 2.2 mmol/L  Lipase, blood  Result Value Ref Range   Lipase 15 11 - 59 U/L  Ethanol  Result Value Ref Range   Alcohol, Ethyl (B) <5 0 - 9 mg/dL  Ammonia  Result Value Ref Range   Ammonia 31 11 - 32 umol/L  Protime-INR  Result Value Ref Range   Prothrombin Time 14.6 11.6 - 15.2 seconds   INR 1.13 0.00 - 1.49  Troponin I  Result Value Ref Range   Troponin I 0.03 <0.031 ng/mL  Urine microscopic-add on  Result Value Ref Range   Squamous Epithelial / LPF MANY (A) RARE   WBC, UA 11-20 <3 WBC/hpf    RBC / HPF 11-20 <3 RBC/hpf   Bacteria, UA FEW (A) RARE  Basic metabolic panel  Result Value Ref Range   Sodium 131 (L) 135 - 145 mmol/L   Potassium 5.5 (H) 3.5 - 5.1 mmol/L   Chloride 99 96 - 112 mEq/L   CO2 15 (L) 19 - 32 mmol/L   Glucose, Bld 98 70 - 99 mg/dL   BUN 76 (H) 6 - 23 mg/dL   Creatinine, Ser 6.99 (H) 0.50 - 1.10 mg/dL   Calcium 8.9 8.4 - 10.5 mg/dL   GFR calc non Af Amer 6 (L) >90 mL/min   GFR calc Af Amer 7 (L) >90 mL/min   Anion gap 17 (H) 5 - 15  CBC with Differential  Result Value Ref Range   WBC 13.2 (H) 4.0 - 10.5 K/uL   RBC 3.72 (L) 3.87 - 5.11 MIL/uL   Hemoglobin 10.8 (L) 12.0 - 15.0 g/dL   HCT 32.3 (L) 36.0 - 46.0 %   MCV 86.8 78.0 - 100.0 fL   MCH 29.0 26.0 - 34.0 pg   MCHC 33.4 30.0 - 36.0 g/dL   RDW 13.3 11.5 - 15.5 %   Platelets 292 150 - 400 K/uL   Neutrophils Relative % 79 (H) 43 - 77 %   Neutro Abs 10.4 (H) 1.7 - 7.7  K/uL   Lymphocytes Relative 10 (L) 12 - 46 %   Lymphs Abs 1.4 0.7 - 4.0 K/uL   Monocytes Relative 11 3 - 12 %   Monocytes Absolute 1.5 (H) 0.1 - 1.0 K/uL   Eosinophils Relative 0 0 - 5 %   Eosinophils Absolute 0.0 0.0 - 0.7 K/uL   Basophils Relative 0 0 - 1 %   Basophils Absolute 0.0 0.0 - 0.1 K/uL  Type and screen  Result Value Ref Range   ABO/RH(D) A POS    Antibody Screen NEG    Sample Expiration 10/15/2014      Imaging Review Ct Head Wo Contrast  10/12/2014   CLINICAL DATA:  Confusion, vomiting, altered mental status.  EXAM: CT HEAD WITHOUT CONTRAST  TECHNIQUE: Contiguous axial images were obtained from the base of the skull through the vertex without intravenous contrast.  COMPARISON:  Head CT 11/18/2009  FINDINGS: No acute intracranial hemorrhage. No focal mass lesion. No CT evidence of acute infarction. No midline shift or mass effect. No hydrocephalus. Basilar cisterns are patent. Paranasal sinuses and mastoid air cells are clear.  IMPRESSION: 1. No acute intracranial findings.  No change from prior.   Electronically  Signed   By: Suzy Bouchard M.D.   On: 10/12/2014 16:54   Dg Chest Port 1 View  10/12/2014   CLINICAL DATA:  Vomiting  EXAM: PORTABLE CHEST - 1 VIEW  COMPARISON:  03/08/2014  FINDINGS: Lordotic positioning. Mild bibasilar atelectasis. No definite pneumonia. Negative for heart failure or effusion.  IMPRESSION: Mild bibasilar atelectasis.   Electronically Signed   By: Franchot Gallo M.D.   On: 10/12/2014 16:36   Dg Abd Portable 1v  10/12/2014   CLINICAL DATA:  Vomiting  EXAM: PORTABLE ABDOMEN - 1 VIEW  COMPARISON:  CT abdomen pelvis 05/29/2013  FINDINGS: Image quality is suboptimal due to technique and large patient size  Nonobstructive bowel gas pattern.  No dilated bowel loops.  Advanced degenerative change in both hip joints.  IMPRESSION: Negative.   Electronically Signed   By: Franchot Gallo M.D.   On: 10/12/2014 16:37     EKG Interpretation   Date/Time:  Tuesday October 12 2014 14:33:39 EST Ventricular Rate:  123 PR Interval:  155 QRS Duration: 135 QT Interval:  315 QTC Calculation: 451 R Axis:   120 Text Interpretation:  Sinus tachycardia Nonspecific intraventricular  conduction delay Nonspecific T abnormalities, diffuse leads Suggestive of  hyperkalemia Confirmed by Shawndell Schillaci  MD, [place]ino Sardo [phone]) on 10/12/2014  3:56:54 PM      CRITICAL CARE Performed by: Fredia Sorrow Total critical care time: 60 Critical care time was exclusive of separately billable procedures and treating other patients. Critical care was necessary to treat or prevent imminent or life-threatening deterioration. Critical care was time spent personally by me on the following activities: development of treatment plan with patient and/or surrogate as well as nursing, discussions with consultants, evaluation of patient's response to treatment, examination of patient, obtaining history from patient or surrogate, ordering and performing treatments and interventions, ordering and review of laboratory studies,  ordering and review of radiographic studies, pulse oximetry and re-evaluation of patient's condition.     MDM   Final diagnoses:  Vomiting  Abdominal distension  Renal failure  Hyperkalemia  Confusion    Patient will be admitted to ICU. Discussed also with nephrology. Presentation predominantly seems to be a post renal obstructive pattern with renal failure. Patient with Foley catheter placed and got out over 4 L of urine. Patient  had significant hyperkalemia EKG was consistent with that. Patient received calcium gluconate as well as Kayexalate. With the post obstruction being resolved by the Foley patient's renal function should improve slowly potassium should improve rapidly. Patient also with altered mental status not sure what line. Did not get a good report when patient was referred in. CNA said that she brought her in for vomiting there was some dried blood around her lips. Patient mucous membranes also were not moist. Abdomen seemed to be diffusely tender but was not rigid. Review of systems and history all of limited by level V caveat due to the patient's mental status. CT head showed no evidence of any acute changes. Suspect that most symptoms have to do with the post renal failure. Patient's airway was intact. And patient able to swallow. Patient nonverbal. No fever. But persistent tachycardia consistent with sinus tach. Feel all related to the renal failure.    Fredia Sorrow, MD 10/12/14 2203

## 2014-10-12 NOTE — ED Notes (Signed)
MD at bedside. 

## 2014-10-12 NOTE — ED Notes (Signed)
Per lab, they are unable to use previously drawn blood to run labs at this time and will reattempt lab stick again

## 2014-10-12 NOTE — ED Notes (Signed)
Per EMS, pt sent here by CNA for evaluation of vomiting. Pt has what looks like dried blood on her mouth and her clothing. Pt nonverbal at present

## 2014-10-12 NOTE — H&P (Signed)
[facility] History and Physical  [name] ZJQ:[id number] DOB: 26-May-1961 DOA: 10/12/2014  Referring physician: ER PCP: Barbette Merino, MD   Chief Complaint: Nausea and vomiting  HPI: [name] is a 53 y.o. female  This is a 53 year old lady who was sent by EMS for evaluation of vomiting. The patient herself cannot give me any clear history because she is so unwell and appears to be nonverbal. Evaluation in the emergency room with blood work showed her to be in acute renal failure with hyperkalemia and metabolic acidosis. Her blood pressure remained stable, even somewhat hypertensive. She is being admitted for treatment of her acute renal failure with a creatinine of 11.63. Previously, only a couple weeks ago her creatinine was in the normal range of 0.52. From the medical records, it appears that she has HIV and bipolar disorder as diagnoses listed. She cannot really give me any further history at the present time. In the emergency room, a Foley catheter was inserted into the bladder and copious amounts of urine has been obtained, at least 6 L so far and still coming. This appears to be a post renal acute renal failure. The etiology of this is not clear.   Review of Systems:  Unable to give me any clear review of systems based on her altered mental status.  Past Medical History  Diagnosis Date  . Baker's cyst   . Pulmonary lesion 1/03    on RUL onn CT   . Hemorrhoids   . Zoster 2001  . Family history of diabetes mellitus   . Tobacco user   . Other abnormal Papanicolaou smear of cervix and cervical HPV(795.09)   . Herpes zoster   . Hemorrhoids   . Baker's cyst   . Pancreatitis   . HIV disease     take Prezista,Truvada,Norvir,and Isentress daily  . Bipolar disorder     With hx of psychotic features.  . Articular cartilage disorder of knee   . Bursitis of shoulder   . Rotator cuff arthropathy   . Hypertension     takes Clonidine and Verapamil daily  . Emphysema    . Asthma     Albuterol resue inhaler and QVAR daily  . Shortness of breath     with exertion and stress  . Dizziness   . Joint pain   . Joint swelling   . Chronic back pain     buldging disc  . Bruises easily   . Gastric ulcer   . GERD (gastroesophageal reflux disease)     takes Nexium daily  . Chronic constipation   . Urinary frequency   . Urinary incontinence   . Nocturia   . History of blood transfusion at age 49  . Early cataracts, bilateral   . Anxiety   . Depression     takes Xanax daily and Depakote bid  . History of shingles   . 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 Tobacco abuse 10/26/2012  . Chronic back pain   . Chronic knee pain   . Complication of anesthesia     AGGITATED  . Uterine cancer     "had some kind of cancer in my teens; had to have TAH to remove it"  . Headache(784.0)     "often here lately; weather changing"  . Pneumonia     "several times"  . Arthritis     "all over" (03/16/2014)  . Osteoarthritis of left knee 03/16/2014  . Alcohol withdrawal delirium 03/22/2014  Past Surgical History  Procedure Laterality Date  . Breast lumpectomy Right   . Breast biopsy Right ~ 1980  . Cholecystectomy  10+yrs ago  . Knee arthroscopy Left   . Finger surgery Right     rt middle finger with pin in it  . Epidural injections    . Esophagogastroduodenoscopy    . Shoulder hemi-arthroplasty  05/06/2012    Procedure: SHOULDER HEMI-ARTHROPLASTY;  Surgeon: Johnny Bridge, MD;  Location: [facility];  Service: Orthopedics;  Laterality: Right;  . Joint replacement    . Fracture surgery      Right finger third digit  . Appendectomy  1981    with hysterectomy  . Total knee arthroplasty Left 03/16/2014    Procedure: LEFT TOTAL KNEE ARTHROPLASTY;  Surgeon: Johnny Bridge, MD;  Location: [facility];  Service: Orthopedics;  Laterality: Left;  . Total abdominal hysterectomy  1981    w/BSO  . Esophagogastroduodenoscopy (egd) with propofol  N/A 09/30/2014    Procedure: ESOPHAGOGASTRODUODENOSCOPY (EGD) WITH PROPOFOL;  Surgeon: Daneil Dolin, MD;  Location: [facility];  Service: Endoscopy;  Laterality: N/A;  . Esophageal biopsy  09/30/2014    Procedure: GASTRIC AND ESOPHAGEAL BIOPSY;  Surgeon: Daneil Dolin, MD;  Location: [facility];  Service: Endoscopy;;   Social History:  reports that she has been smoking Cigarettes.  She has a 32 pack-year smoking history. She has never used smokeless tobacco. She reports that she drinks about 5.4 oz of alcohol per week. She reports that she does not use illicit drugs.  Allergies  Allergen Reactions  . Aspirin Nausea And Vomiting  . Sulfonamide Derivatives Hives    Family History  Problem Relation Age of Onset  . Diabetes      Family History  . Hypertension      Family history   . Hypertension Mother   . Diabetes Mother   . Heart failure Mother   . Diabetes Father   . Cancer Sister 53    x2 (liver)  . Diabetes Brother   . Colon cancer Neg Hx   . Pancreatic disease Neg Hx      Prior to Admission medications   Medication Sig Start Date End Date Taking? Authorizing Provider  albuterol (PROVENTIL HFA;VENTOLIN HFA) 108 (90 BASE) MCG/ACT inhaler Inhale 2 puffs into the lungs every 4 (four) hours as needed for shortness of breath.  12/31/11  Yes Fayrene Helper, MD  ALPRAZolam Duanne Moron) 1 MG tablet Take one tablet by mouth twice daily as needed for anxiety Patient taking differently: Take 1 mg by mouth 2 (two) times daily as needed for anxiety. Take one tablet by mouth twice daily as needed for anxiety 05/24/14  Yes Tiffany L Reed, DO  esomeprazole (NEXIUM) 40 MG capsule Take 40 mg by mouth daily before breakfast. 05/02/11  Yes Fayrene Helper, MD  lubiprostone (AMITIZA) 8 MCG capsule Take 1 capsule (8 mcg total) by mouth 2 (two) times daily with a meal. 09/22/14  Yes Mahala Menghini, PA-C  oxybutynin (DITROPAN-XL) 10 MG 24 hr tablet Take 10 mg by mouth 2 (two) times daily.   Yes Historical  Provider, MD  verapamil (CALAN-SR) 240 MG CR tablet Take 240 mg by mouth daily.   Yes Historical Provider, MD  zolpidem (AMBIEN) 5 MG tablet Take one tablet by mouth at bedtime as needed for sleep Patient taking differently: Take 5 mg by mouth at bedtime as needed for sleep. Take one tablet by mouth at bedtime as needed for  sleep 05/20/14  Yes Tiffany L Reed, DO  albuterol (PROVENTIL) (2.5 MG/3ML) 0.083% nebulizer solution Take 2.5 mg by nebulization every 6 (six) hours as needed for wheezing or shortness of breath.    Historical Provider, MD  Azelastine HCl 0.15 % SOLN Place 1 spray into the nose daily as needed (for allergies).     Historical Provider, MD  beclomethasone (QVAR) 40 MCG/ACT inhaler Inhale 2 puffs into the lungs 2 (two) times daily.    Historical Provider, MD  bisacodyl (DULCOLAX) 5 MG EC tablet Take 1 tablet (5 mg total) by mouth daily as needed for moderate constipation. 03/16/14   Johnny Bridge, MD  bismuth subsalicylate (PEPTO BISMOL) 262 MG/15ML suspension Take 30 mLs by mouth every 12 (twelve) hours as needed for indigestion.    Historical Provider, MD  calcium carbonate (TUMS - DOSED IN MG ELEMENTAL CALCIUM) 500 MG chewable tablet Chew 2 tablets by mouth daily as needed for indigestion or heartburn.    Historical Provider, MD  cetirizine (ZYRTEC) 10 MG tablet Take 10 mg by mouth at bedtime.     Historical Provider, MD  cloNIDine (CATAPRES) 0.2 MG tablet Take 0.1 mg by mouth at bedtime.    Historical Provider, MD  cyanocobalamin (,VITAMIN B-12,) 1000 MCG/ML injection Inject 1,000 mcg into the muscle every 30 (thirty) days.    Historical Provider, MD  Darunavir Ethanolate (PREZISTA) 800 MG tablet Take 800 mg by mouth daily with breakfast.    Historical Provider, MD  diazepam (VALIUM) 5 MG tablet Take 5 mg by mouth daily as needed for muscle spasms.  08/16/14   Historical Provider, MD  diphenoxylate-atropine (LOMOTIL) 2.5-0.025 MG per tablet Take 1 tablet by mouth 4 (four) times  daily as needed for diarrhea or loose stools. 03/23/14   Blanchie Serve, MD  emtricitabine-tenofovir (TRUVADA) 200-300 MG per tablet Take 1 tablet by mouth daily. 06/29/14   Campbell Riches, MD  fluconazole (DIFLUCAN) 100 MG tablet Take 1 tablet (100 mg total) by mouth daily. 08/17/14   Campbell Riches, MD  folic acid (FOLVITE) 1 MG tablet Take 1 mg by mouth daily.    Historical Provider, MD  levofloxacin (LEVAQUIN) 500 MG tablet Take 500 mg by mouth daily.  09/14/14   Historical Provider, MD  methocarbamol (ROBAXIN) 750 MG tablet Take 1 tablet (750 mg total) by mouth every 6 (six) hours as needed for muscle spasms. 03/16/14   Johnny Bridge, MD  ondansetron (ZOFRAN) 4 MG tablet Take 4 mg by mouth every 8 (eight) hours as needed for nausea or vomiting.    Historical Provider, MD  oxyCODONE-acetaminophen (PERCOCET) 7.5-325 MG per tablet Take 1 tablet by mouth 4 (four) times daily as needed for pain.  09/13/14   Historical Provider, MD  Phenazopyridine HCl (AZO TABS PO) Take 2 tablets by mouth daily as needed (urinary symptoms).    Historical Provider, MD  potassium chloride (K-DUR) 10 MEQ tablet Take 10 mEq by mouth daily.    Historical Provider, MD  promethazine (PHENERGAN) 25 MG tablet Take 25 mg by mouth every 6 (six) hours as needed for nausea.    Historical Provider, MD  QVAR 80 MCG/ACT inhaler Inhale 2 puffs into the lungs 2 (two) times daily. 09/03/14   Historical Provider, MD  raltegravir (ISENTRESS) 400 MG tablet Take 1 tablet (400 mg total) by mouth 2 (two) times daily. 08/19/14   Campbell Riches, MD  ritonavir (NORVIR) 100 MG TABS tablet Take 100 mg by mouth daily with breakfast.  Historical Provider, MD  tetrahydrozoline 0.05 % ophthalmic solution Place 1 drop into both eyes 2 (two) times daily as needed.    Historical Provider, MD  tiotropium (SPIRIVA) 18 MCG inhalation capsule Place 18 mcg into inhaler and inhale daily.    Historical Provider, MD  traMADol (ULTRAM) 50 MG tablet Take  50 mg by mouth daily.  08/05/14   Historical Provider, MD  Vitamin D, Ergocalciferol, (DRISDOL) 50000 UNITS CAPS Take 50,000 Units by mouth every 7 (seven) days. Take on mondays    Historical Provider, MD   Physical Exam: Filed Vitals:   10/12/14 1600 10/12/14 1630 10/12/14 1633 10/12/14 1645  BP:  163/124  160/118  Pulse:      Temp:   99.2 F (37.3 C)   TempSrc:   Core (Comment)   Resp:  24 16 19   SpO2: 100%  98%     Wt Readings from Last 3 Encounters:  09/28/14 70.308 kg (155 lb)  09/22/14 74.571 kg (164 lb 6.4 oz)  08/17/14 76.658 kg (169 lb)    General:  Appears very sick and dehydrated. Eyes: PERRL, normal lids, irises & conjunctiva ENT: grossly normal hearing, lips & tongue Neck: no LAD, masses or thyromegaly Cardiovascular: RRR, no m/r/g. No LE edema. Telemetry: SR, no arrhythmias  Respiratory: CTA bilaterally, no w/r/r. Normal respiratory effort. Abdomen: She is tender in the lower part of her abdomen but otherwise the abdomen is soft and does not appear to be an acute abdomen. Skin: no rash or induration seen on limited exam Musculoskeletal: grossly normal tone BUE/BLE Psychiatric: Not examined Neurologic: grossly non-focal.          Labs on Admission:  Basic Metabolic Panel:  Recent Labs Lab 10/12/14 1459  NA 126*  K 7.9*  CL 92*  CO2 15*  GLUCOSE 74  BUN 99*  CREATININE 11.63*  CALCIUM 8.9   Liver Function Tests:  Recent Labs Lab 10/12/14 1459  AST 59*  ALT 21  ALKPHOS 135*  BILITOT 2.2*  PROT 8.5*  ALBUMIN 2.6*   No results for input(s): LIPASE, AMYLASE in the last 168 hours.  Recent Labs Lab 10/12/14 1517  AMMONIA 31   CBC:  Recent Labs Lab 10/12/14 1459  WBC 15.1*  HGB 11.6*  HCT 33.6*  MCV 85.7  PLT 315   Cardiac Enzymes: No results for input(s): CKTOTAL, CKMB, CKMBINDEX, TROPONINI in the last 168 hours.  BNP (last 3 results) No results for input(s): PROBNP in the last 8760 hours. CBG: No results for input(s):  GLUCAP in the last 168 hours.  Radiological Exams on Admission: Ct Head Wo Contrast  10/12/2014   CLINICAL DATA:  Confusion, vomiting, altered mental status.  EXAM: CT HEAD WITHOUT CONTRAST  TECHNIQUE: Contiguous axial images were obtained from the base of the skull through the vertex without intravenous contrast.  COMPARISON:  Head CT 11/18/2009  FINDINGS: No acute intracranial hemorrhage. No focal mass lesion. No CT evidence of acute infarction. No midline shift or mass effect. No hydrocephalus. Basilar cisterns are patent. Paranasal sinuses and mastoid air cells are clear.  IMPRESSION: 1. No acute intracranial findings.  No change from prior.   Electronically Signed   By: Suzy Bouchard M.D.   On: 10/12/2014 16:54   Dg Chest Port 1 View  10/12/2014   CLINICAL DATA:  Vomiting  EXAM: PORTABLE CHEST - 1 VIEW  COMPARISON:  03/08/2014  FINDINGS: Lordotic positioning. Mild bibasilar atelectasis. No definite pneumonia. Negative for heart failure or effusion.  IMPRESSION:  Mild bibasilar atelectasis.   Electronically Signed   By: Franchot Gallo M.D.   On: 10/12/2014 16:36   Dg Abd Portable 1v  10/12/2014   CLINICAL DATA:  Vomiting  EXAM: PORTABLE ABDOMEN - 1 VIEW  COMPARISON:  CT abdomen pelvis 05/29/2013  FINDINGS: Image quality is suboptimal due to technique and large patient size  Nonobstructive bowel gas pattern.  No dilated bowel loops.  Advanced degenerative change in both hip joints.  IMPRESSION: Negative.   Electronically Signed   By: Franchot Gallo M.D.   On: 10/12/2014 16:37    EKG: Independently reviewed. Sinus tachycardia with possible peaked T waves.  Assessment/Plan   1. Acute renal failure, creatinine is 11.63 with a potassium of 7.9 and bicarbonate of only 15. This appears to be post renal as over 6 L of urine has been obtained in the Foley catheter. She will require intensive care unit care. We will continue with IV dextrose saline. She has already been given Kayexalate and other  measures to reduce her hyperkalemia in the emergency room. Urine will be cultured. Nephrology has been consulted. We will check renal function every 4-6 hours. 2. HIV, appears to be stable from previous notes. 3. History of bipolar disorder, unclear if this is related to the present time. 4. Hypertension, blood pressure at the present time slightly elevated.  Further recommendations will depend on patient's hospital progress.  Code Status: Full code  DVT Prophylaxis: SCDs  Family Communication: No family members present to discuss the plan.   Disposition Plan: Home when medically stable.   Time spent: 60 minutes.  Doree Albee [facility] Pager [phone].

## 2014-10-12 NOTE — ED Notes (Signed)
Attempted report x1, RN unable to take report

## 2014-10-13 ENCOUNTER — Inpatient Hospital Stay (HOSPITAL_COMMUNITY): Payer: Medicare Other

## 2014-10-13 DIAGNOSIS — G9340 Encephalopathy, unspecified: Secondary | ICD-10-CM | POA: Diagnosis present

## 2014-10-13 DIAGNOSIS — N390 Urinary tract infection, site not specified: Secondary | ICD-10-CM

## 2014-10-13 LAB — COMPREHENSIVE METABOLIC PANEL
ALK PHOS: 112 U/L (ref 39–117)
ALT: 25 U/L (ref 0–35)
AST: 82 U/L — AB (ref 0–37)
Albumin: 2.5 g/dL — ABNORMAL LOW (ref 3.5–5.2)
Anion gap: 12 (ref 5–15)
BILIRUBIN TOTAL: 1.7 mg/dL — AB (ref 0.3–1.2)
BUN: 47 mg/dL — ABNORMAL HIGH (ref 6–23)
CHLORIDE: 104 meq/L (ref 96–112)
CO2: 21 mmol/L (ref 19–32)
Calcium: 9.1 mg/dL (ref 8.4–10.5)
Creatinine, Ser: 2.18 mg/dL — ABNORMAL HIGH (ref 0.50–1.10)
GFR calc Af Amer: 29 mL/min — ABNORMAL LOW (ref >90)
GFR, EST NON AFRICAN AMERICAN: 25 mL/min — AB (ref >90)
Glucose, Bld: 129 mg/dL — ABNORMAL HIGH (ref 70–99)
Potassium: 4.4 mmol/L (ref 3.5–5.1)
Sodium: 137 mmol/L (ref 135–145)
Total Protein: 7.7 g/dL (ref 6.0–8.3)

## 2014-10-13 LAB — CBC
HCT: 34.5 % — ABNORMAL LOW (ref 36.0–46.0)
Hemoglobin: 11.6 g/dL — ABNORMAL LOW (ref 12.0–15.0)
MCH: 29.1 pg (ref 26.0–34.0)
MCHC: 33.6 g/dL (ref 30.0–36.0)
MCV: 86.5 fL (ref 78.0–100.0)
PLATELETS: 289 10*3/uL (ref 150–400)
RBC: 3.99 MIL/uL (ref 3.87–5.11)
RDW: 13.5 % (ref 11.5–15.5)
WBC: 12.1 10*3/uL — ABNORMAL HIGH (ref 4.0–10.5)

## 2014-10-13 LAB — BASIC METABOLIC PANEL
ANION GAP: 12 (ref 5–15)
BUN: 42 mg/dL — AB (ref 6–23)
CALCIUM: 8.9 mg/dL (ref 8.4–10.5)
CO2: 24 mmol/L (ref 19–32)
CREATININE: 1.31 mg/dL — AB (ref 0.50–1.10)
Chloride: 101 mEq/L (ref 96–112)
GFR calc Af Amer: 54 mL/min — ABNORMAL LOW (ref >90)
GFR calc non Af Amer: 46 mL/min — ABNORMAL LOW (ref >90)
GLUCOSE: 132 mg/dL — AB (ref 70–99)
Potassium: 3.9 mmol/L (ref 3.5–5.1)
Sodium: 137 mmol/L (ref 135–145)

## 2014-10-13 LAB — MRSA PCR SCREENING: MRSA by PCR: NEGATIVE

## 2014-10-13 LAB — BLOOD GAS, ARTERIAL
ACID-BASE EXCESS: 1.5 mmol/L (ref 0.0–2.0)
BICARBONATE: 24.9 meq/L — AB (ref 20.0–24.0)
Drawn by: 25955
FIO2: 2 %
O2 Content: 2 L/min
O2 Saturation: 96 %
PH ART: 7.469 — AB (ref 7.350–7.450)
Patient temperature: 37
TCO2: 22.6 mmol/L (ref 0–100)
pCO2 arterial: 34.7 mmHg — ABNORMAL LOW (ref 35.0–45.0)
pO2, Arterial: 80.2 mmHg (ref 80.0–100.0)

## 2014-10-13 LAB — URINE CULTURE
COLONY COUNT: NO GROWTH
CULTURE: NO GROWTH

## 2014-10-13 LAB — RAPID URINE DRUG SCREEN, HOSP PERFORMED
AMPHETAMINES: NOT DETECTED
BARBITURATES: NOT DETECTED
BENZODIAZEPINES: POSITIVE — AB
Cocaine: NOT DETECTED
Opiates: NOT DETECTED
TETRAHYDROCANNABINOL: NOT DETECTED

## 2014-10-13 LAB — AMMONIA: AMMONIA: 38 umol/L — AB (ref 11–32)

## 2014-10-13 MED ORDER — OXYCODONE-ACETAMINOPHEN 5-325 MG PO TABS: 1.0000 | ORAL_TABLET | Freq: Four times a day (QID) | ORAL | Status: DC | PRN

## 2014-10-13 MED ORDER — VANCOMYCIN HCL IN DEXTROSE 750-5 MG/150ML-% IV SOLN
INTRAVENOUS | Status: AC
Start: 2014-10-13 — End: 2014-10-13
  Filled 2014-10-13: qty 150

## 2014-10-13 MED ORDER — ALPRAZOLAM 0.5 MG PO TABS
1.0000 mg | ORAL_TABLET | Freq: Two times a day (BID) | ORAL | Status: DC | PRN
Start: 2014-10-13 — End: 2014-10-13
  Administered 2014-10-13: 1 mg via ORAL
  Filled 2014-10-13: qty 2

## 2014-10-13 MED ORDER — ACETAMINOPHEN 650 MG RE SUPP
650.0000 mg | Freq: Four times a day (QID) | RECTAL | Status: DC | PRN
  Administered 2014-10-13 – 2014-10-14 (×2): 650 mg via RECTAL
  Filled 2014-10-13 (×2): qty 1

## 2014-10-13 MED ORDER — VANCOMYCIN HCL IN DEXTROSE 750-5 MG/150ML-% IV SOLN
750.0000 mg | Freq: Two times a day (BID) | INTRAVENOUS | Status: DC
  Administered 2014-10-14 – 2014-10-17 (×7): 750 mg via INTRAVENOUS
  Filled 2014-10-13 (×9): qty 150

## 2014-10-13 MED ORDER — VANCOMYCIN HCL 10 G IV SOLR
1250.0000 mg | Freq: Once | INTRAVENOUS | Status: AC
  Administered 2014-10-13: 1250 mg via INTRAVENOUS
  Filled 2014-10-13: qty 1250

## 2014-10-13 MED ORDER — ENOXAPARIN SODIUM 40 MG/0.4ML ~~LOC~~ SOLN: 40.0000 mg | SUBCUTANEOUS | Status: DC

## 2014-10-13 MED ORDER — CLONIDINE HCL 0.1 MG PO TABS: 0.1000 mg | ORAL_TABLET | Freq: Every day | ORAL | Status: DC

## 2014-10-13 MED ORDER — DEXTROSE-NACL 5-0.45 % IV SOLN
INTRAVENOUS | Status: DC
  Administered 2014-10-13 – 2014-10-16 (×5): via INTRAVENOUS

## 2014-10-13 MED ORDER — SODIUM CHLORIDE 0.9 % IV BOLUS (SEPSIS)
1000.0000 mL | Freq: Once | INTRAVENOUS | Status: AC
  Administered 2014-10-13: 1000 mL via INTRAVENOUS

## 2014-10-13 MED ORDER — CEFTRIAXONE SODIUM IN DEXTROSE 20 MG/ML IV SOLN
1.0000 g | INTRAVENOUS | Status: DC
  Administered 2014-10-13: 1 g via INTRAVENOUS
  Filled 2014-10-13 (×6): qty 50

## 2014-10-13 MED ORDER — PIPERACILLIN-TAZOBACTAM 3.375 G IVPB
INTRAVENOUS | Status: AC
  Filled 2014-10-13: qty 100

## 2014-10-13 MED ORDER — OXYCODONE-ACETAMINOPHEN 5-325 MG PO TABS
1.0000 | ORAL_TABLET | Freq: Four times a day (QID) | ORAL | Status: DC | PRN
Start: 2014-10-13 — End: 2014-10-13
  Administered 2014-10-13: 1 via ORAL
  Filled 2014-10-13 (×2): qty 1

## 2014-10-13 MED ORDER — PIPERACILLIN-TAZOBACTAM 3.375 G IVPB
3.3750 g | Freq: Three times a day (TID) | INTRAVENOUS | Status: DC
  Administered 2014-10-13 – 2014-10-14 (×2): 3.375 g via INTRAVENOUS
  Filled 2014-10-13 (×15): qty 50

## 2014-10-13 MED ORDER — VERAPAMIL HCL ER 240 MG PO TBCR
240.0000 mg | EXTENDED_RELEASE_TABLET | Freq: Every day | ORAL | Status: DC
  Administered 2014-10-13: 240 mg via ORAL
  Filled 2014-10-13: qty 1

## 2014-10-13 NOTE — Progress Notes (Signed)
ANTIBIOTIC CONSULT NOTE - INITIAL  Pharmacy Consult for Vancomycin and Zosyn Indication: pneumonia  Allergies  Allergen Reactions  . Aspirin Nausea And Vomiting  . Sulfonamide Derivatives Hives   Patient Measurements: Height: 5\' 5"  (165.1 cm) Weight: 146 lb 2.6 oz (66.3 kg) IBW/kg (Calculated) : 57  Vital Signs: Temp: 102.3 F (39.1 C) (12/23 1729) Temp Source: Axillary (12/23 1729) BP: 111/83 mmHg (12/23 1800) Intake/Output from previous day: 12/22 0701 - 12/23 0700 In: 1602.1 [I.V.:1602.1] Out: 7800 [Urine:7800] Intake/Output from this shift: Total I/O In: -  Out: 850 [Urine:850]  Labs:  Recent Labs  10/12/14 1459 10/12/14 2045 10/12/14 2250 10/13/14 0450 10/13/14 1123  WBC 15.1* 13.2*  --  12.1*  --   HGB 11.6* 10.8*  --  11.6*  --   PLT 315 292  --  289  --   CREATININE 11.63* 6.99* 3.75* 2.18* 1.31*   Estimated Creatinine Clearance: 45.7 mL/min (by C-G formula based on Cr of 1.31). No results for input(s): VANCOTROUGH, VANCOPEAK, VANCORANDOM, GENTTROUGH, GENTPEAK, GENTRANDOM, TOBRATROUGH, TOBRAPEAK, TOBRARND, AMIKACINPEAK, AMIKACINTROU, AMIKACIN in the last 72 hours.   Microbiology: Recent Results (from the past 720 hour(s))  Culture, blood (routine x 2)     Status: None (Preliminary result)   Collection Time: 10/12/14  3:17 PM  Result Value Ref Range Status   Specimen Description BLOOD RIGHT HAND  Final   Special Requests BOTTLES DRAWN AEROBIC ONLY 5CC  Final   Culture NO GROWTH 1 DAY  Final   Report Status PENDING  Incomplete  Culture, blood (routine x 2)     Status: None (Preliminary result)   Collection Time: 10/12/14  5:00 PM  Result Value Ref Range Status   Specimen Description BLOOD RIGHT HAND  Final   Special Requests BOTTLES DRAWN AEROBIC ONLY 5CC  Final   Culture NO GROWTH 1 DAY  Final   Report Status PENDING  Incomplete  MRSA PCR Screening     Status: None   Collection Time: 10/12/14  8:00 PM  Result Value Ref Range Status   MRSA by PCR  NEGATIVE NEGATIVE Final    Comment:        The GeneXpert MRSA Assay (FDA approved for NASAL specimens only), is one component of a comprehensive MRSA colonization surveillance program. It is not intended to diagnose MRSA infection nor to guide or monitor treatment for MRSA infections.    Medical History: Past Medical History  Diagnosis Date  . Baker's cyst   . Pulmonary lesion 1/03    on RUL onn CT   . Hemorrhoids   . Zoster 2001  . Family history of diabetes mellitus   . Tobacco user   . Other abnormal Papanicolaou smear of cervix and cervical HPV(795.09)   . Herpes zoster   . Hemorrhoids   . Baker's cyst   . Pancreatitis   . HIV disease     take Prezista,Truvada,Norvir,and Isentress daily  . Bipolar disorder     With hx of psychotic features.  . Articular cartilage disorder of knee   . Bursitis of shoulder   . Rotator cuff arthropathy   . Hypertension     takes Clonidine and Verapamil daily  . Emphysema   . Asthma     Albuterol resue inhaler and QVAR daily  . Shortness of breath     with exertion and stress  . Dizziness   . Joint pain   . Joint swelling   . Chronic back pain     buldging disc  .  Bruises easily   . Gastric ulcer   . GERD (gastroesophageal reflux disease)     takes Nexium daily  . Chronic constipation   . Urinary frequency   . Urinary incontinence   . Nocturia   . History of blood transfusion at age 66  . Early cataracts, bilateral   . Anxiety   . Depression     takes Xanax daily and Depakote bid  . History of shingles   . 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 Tobacco abuse 10/26/2012  . Chronic back pain   . Chronic knee pain   . Complication of anesthesia     AGGITATED  . Uterine cancer     "had some kind of cancer in my teens; had to have TAH to remove it"  . Headache(784.0)     "often here lately; weather changing"  . Pneumonia     "several times"  . Arthritis     "all over"  (03/16/2014)  . Osteoarthritis of left knee 03/16/2014  . Alcohol withdrawal delirium 03/22/2014   Anti-infectives    Start     Dose/Rate Route Frequency Ordered Stop   10/14/14 0600  vancomycin (VANCOCIN) IVPB 750 mg/150 ml premix     750 mg150 mL/hr over 60 Minutes Intravenous Every 12 hours 10/13/14 1822     10/13/14 2200  piperacillin-tazobactam (ZOSYN) IVPB 3.375 g     3.375 g12.5 mL/hr over 240 Minutes Intravenous Every 8 hours 10/13/14 1822     10/13/14 1900  vancomycin (VANCOCIN) 1,250 mg in sodium chloride 0.9 % 250 mL IVPB     1,250 mg166.7 mL/hr over 90 Minutes Intravenous  Once 10/13/14 1822     10/13/14 1230  cefTRIAXone (ROCEPHIN) 1 g in dextrose 5 % 50 mL IVPB - Premix     1 g100 mL/hr over 30 Minutes Intravenous Every 24 hours 10/13/14 1113       Assessment: 53yo female admitted with ARF and possible sepsis from UTI.  Pt has h/o HIV.  Asked to initiate Vancomycin and Zosyn for aspiration pneumonia.  SCr is improving.  Estimated Creatinine Clearance: 45.7 mL/min (by C-G formula based on Cr of 1.31).  Goal of Therapy:  Vancomycin trough level 15-20 mcg/ml Avoid adverse effects  Plan: Zosyn 3.375gm IV q8h, each dose over 4 hrs Vancomycin 1250mg  IV now x 1 then Vancomycin 750mg  IV q12hrs Check trough at steady state Monitor labs, renal fxn, and cultures  Hart Robinsons A 10/13/2014,6:23 PM

## 2014-10-13 NOTE — Progress Notes (Signed)
[facility] PROGRESS NOTE  Vernecia Umble WFU:[id number] DOB: 22-Oct-1961 DOA: 10/12/2014 PCP: Barbette Merino, MD  Assessment/Plan: 1. Acute renal failure. Creatinine on admission was 11.63. With placement of Foley catheter she has had approximately 7.8 L of urine output since admission. This would indicate a postobstructive uropathy. Will order renal ultrasound. Will likely need to discuss further with urology once imaging results are available. Creatinine has not improved to 2.18. Continue IV fluids. Appreciate nephrology input. 2. Hyperkalemia. Related to #1. Resolved. 3. Acute encephalopathy. Possibly related to developing sepsis from urinary tract infection, versus residual encephalopathy from renal failure. Will check urine drug screen, ammonia, ABG. Check MRI brain to rule out any underlying pathologies. She was also taking Xanax and oxycodone prior to admission. This will be held all she is lethargic. 4. HIV. Recent CD4 count from 06/2014 was 1330 indicating good compliance. She is on antiretroviral therapy. We'll restart these once her mental status improves. 5. Hypertension. Patient takes clonidine, verapamil as an outpatient. Blood pressure is fairly controlled. Continue current treatments  Code Status: full code Family Communication: no family present Disposition Plan: pending hospital course   Consultants:  Nephrology  Procedures:    Antibiotics:  rocephin 12/23  HPI/Subjective: Patient is somnolent, does not answer any questions  Objective: Filed Vitals:   10/13/14 0818  BP: 128/101  Pulse:   Temp:   Resp:     Intake/Output Summary (Last 24 hours) at 10/13/14 1111 Last data filed at 10/13/14 0200  Gross per 24 hour  Intake 1602.08 ml  Output   7800 ml  Net -6197.92 ml   Filed Weights   10/12/14 2000 10/13/14 0500  Weight: 66.1 kg (145 lb 11.6 oz) 66.3 kg (146 lb 2.6 oz)    Exam:   General:  NAD, somnolent, does not answer  questions  Cardiovascular: s1, s2, tachycardic  Respiratory: cta b  Abdomen: soft, nt, nd, bs+  Musculoskeletal:  No edema b/l   Data Reviewed: Basic Metabolic Panel:  Recent Labs Lab 10/12/14 1459 10/12/14 2045 10/12/14 2250 10/13/14 0450  NA 126* 131* 130* 137  K 7.9* 5.5* 4.2 4.4  CL 92* 99 100 104  CO2 15* 15* 15* 21  GLUCOSE 74 98 149* 129*  BUN 99* 76* 58* 47*  CREATININE 11.63* 6.99* 3.75* 2.18*  CALCIUM 8.9 8.9 8.8 9.1   Liver Function Tests:  Recent Labs Lab 10/12/14 1459 10/13/14 0450  AST 59* 82*  ALT 21 25  ALKPHOS 135* 112  BILITOT 2.2* 1.7*  PROT 8.5* 7.7  ALBUMIN 2.6* 2.5*    Recent Labs Lab 10/12/14 1700  LIPASE 15    Recent Labs Lab 10/12/14 1517  AMMONIA 31   CBC:  Recent Labs Lab 10/12/14 1459 10/12/14 2045 10/13/14 0450  WBC 15.1* 13.2* 12.1*  NEUTROABS  --  10.4*  --   HGB 11.6* 10.8* 11.6*  HCT 33.6* 32.3* 34.5*  MCV 85.7 86.8 86.5  PLT 315 292 289   Cardiac Enzymes:  Recent Labs Lab 10/12/14 1700  TROPONINI 0.03   BNP (last 3 results) No results for input(s): PROBNP in the last 8760 hours. CBG: No results for input(s): GLUCAP in the last 168 hours.  Recent Results (from the past 240 hour(s))  MRSA PCR Screening     Status: None   Collection Time: 10/12/14  8:00 PM  Result Value Ref Range Status   MRSA by PCR NEGATIVE NEGATIVE Final    Comment:        The GeneXpert MRSA Assay (  FDA approved for NASAL specimens only), is one component of a comprehensive MRSA colonization surveillance program. It is not intended to diagnose MRSA infection nor to guide or monitor treatment for MRSA infections.      Studies: Ct Head Wo Contrast  10/12/2014   CLINICAL DATA:  Confusion, vomiting, altered mental status.  EXAM: CT HEAD WITHOUT CONTRAST  TECHNIQUE: Contiguous axial images were obtained from the base of the skull through the vertex without intravenous contrast.  COMPARISON:  Head CT 11/18/2009  FINDINGS: No  acute intracranial hemorrhage. No focal mass lesion. No CT evidence of acute infarction. No midline shift or mass effect. No hydrocephalus. Basilar cisterns are patent. Paranasal sinuses and mastoid air cells are clear.  IMPRESSION: 1. No acute intracranial findings.  No change from prior.   Electronically Signed   By: Suzy Bouchard M.D.   On: 10/12/2014 16:54   Dg Chest Port 1 View  10/12/2014   CLINICAL DATA:  Vomiting  EXAM: PORTABLE CHEST - 1 VIEW  COMPARISON:  03/08/2014  FINDINGS: Lordotic positioning. Mild bibasilar atelectasis. No definite pneumonia. Negative for heart failure or effusion.  IMPRESSION: Mild bibasilar atelectasis.   Electronically Signed   By: Franchot Gallo M.D.   On: 10/12/2014 16:36   Dg Abd Portable 1v  10/12/2014   CLINICAL DATA:  Vomiting  EXAM: PORTABLE ABDOMEN - 1 VIEW  COMPARISON:  CT abdomen pelvis 05/29/2013  FINDINGS: Image quality is suboptimal due to technique and large patient size  Nonobstructive bowel gas pattern.  No dilated bowel loops.  Advanced degenerative change in both hip joints.  IMPRESSION: Negative.   Electronically Signed   By: Franchot Gallo M.D.   On: 10/12/2014 16:37    Scheduled Meds: . cloNIDine  0.1 mg Oral QHS  . enoxaparin (LOVENOX) injection  30 mg Subcutaneous Q24H  . sodium chloride  3 mL Intravenous Q12H  . verapamil  240 mg Oral Daily   Continuous Infusions: . dextrose 5 % and 0.45% NaCl 135 mL/hr at 10/13/14 1038    Active Problems:   Human immunodeficiency virus (HIV) disease   Essential hypertension   Bipolar 1 disorder   Renal failure   Acute renal failure   Acute encephalopathy   UTI (urinary tract infection)    Time spent: 84mins    Vi Biddinger  [facility] Pager [phone]. If 7PM-7AM, please contact night-coverage at [email or web address], password [facility] 10/13/2014, 11:11 AM  LOS: 1 day

## 2014-10-13 NOTE — Progress Notes (Signed)
Quick Note:  Patient currently inpatient with acute renal failure. Newer labs reviewed. Hgb remains stable. Previously negative for viral hepatitis.  Recommend follow up in 12/2014 as scheduled.  Repeat CBC, CMET in 6 weeks. ______

## 2014-10-13 NOTE — Care Management Utilization Note (Signed)
UR completed 

## 2014-10-13 NOTE — Progress Notes (Signed)
Pt unwilling to keep monitoring leads and pulse oximetry on. Educated as to its importance.  No cooperation at this time.

## 2014-10-13 NOTE — Consult Note (Signed)
Reason for Consult: Acute kidney injury Referring Physician: Dr. Jeanella Flattery [name] is an 53 y.o. female.  HPI: She is a patient who has history of hypertension, HIV, bipolar disorder and was brought by EMS because of altered mental status. When patient was evaluated in emergency room she was found to have acute kidney injury associated with obstructive uropathy. Presently patient seems to be somewhat confused and gagging and throwing up. Looking at her previous chart her renal function was normal at the beginning of this month when she came for [facility] because of nausea vomiting and difficulty swallowing.  Past Medical History  Diagnosis Date  . Baker's cyst   . Pulmonary lesion 1/03    on RUL onn CT   . Hemorrhoids   . Zoster 2001  . Family history of diabetes mellitus   . Tobacco user   . Other abnormal Papanicolaou smear of cervix and cervical HPV(795.09)   . Herpes zoster   . Hemorrhoids   . Baker's cyst   . Pancreatitis   . HIV disease     take Prezista,Truvada,Norvir,and Isentress daily  . Bipolar disorder     With hx of psychotic features.  . Articular cartilage disorder of knee   . Bursitis of shoulder   . Rotator cuff arthropathy   . Hypertension     takes Clonidine and Verapamil daily  . Emphysema   . Asthma     Albuterol resue inhaler and QVAR daily  . Shortness of breath     with exertion and stress  . Dizziness   . Joint pain   . Joint swelling   . Chronic back pain     buldging disc  . Bruises easily   . Gastric ulcer   . GERD (gastroesophageal reflux disease)     takes Nexium daily  . Chronic constipation   . Urinary frequency   . Urinary incontinence   . Nocturia   . History of blood transfusion at age 11  . Early cataracts, bilateral   . Anxiety   . Depression     takes Xanax daily and Depakote bid  . History of shingles   . 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 Tobacco abuse 10/26/2012  . Chronic back  pain   . Chronic knee pain   . Complication of anesthesia     AGGITATED  . Uterine cancer     "had some kind of cancer in my teens; had to have TAH to remove it"  . Headache(784.0)     "often here lately; weather changing"  . Pneumonia     "several times"  . Arthritis     "all over" (03/16/2014)  . Osteoarthritis of left knee 03/16/2014  . Alcohol withdrawal delirium 03/22/2014    Past Surgical History  Procedure Laterality Date  . Breast lumpectomy Right   . Breast biopsy Right ~ 1980  . Cholecystectomy  10+yrs ago  . Knee arthroscopy Left   . Finger surgery Right     rt middle finger with pin in it  . Epidural injections    . Esophagogastroduodenoscopy    . Shoulder hemi-arthroplasty  05/06/2012    Procedure: SHOULDER HEMI-ARTHROPLASTY;  Surgeon: Johnny Bridge, MD;  Location: [place];  Service: Orthopedics;  Laterality: Right;  . Joint replacement    . Fracture surgery      Right finger third digit  . Appendectomy  1981    with hysterectomy  . Total knee arthroplasty Left 03/16/2014  Procedure: LEFT TOTAL KNEE ARTHROPLASTY;  Surgeon: Johnny Bridge, MD;  Location: [place];  Service: Orthopedics;  Laterality: Left;  . Total abdominal hysterectomy  1981    w/BSO  . Esophagogastroduodenoscopy (egd) with propofol N/A 09/30/2014    Procedure: ESOPHAGOGASTRODUODENOSCOPY (EGD) WITH PROPOFOL;  Surgeon: Daneil Dolin, MD;  Location: [facility];  Service: Endoscopy;  Laterality: N/A;  . Esophageal biopsy  09/30/2014    Procedure: GASTRIC AND ESOPHAGEAL BIOPSY;  Surgeon: Daneil Dolin, MD;  Location: [facility];  Service: Endoscopy;;    Family History  Problem Relation Age of Onset  . Diabetes      Family History  . Hypertension      Family history   . Hypertension Mother   . Diabetes Mother   . Heart failure Mother   . Diabetes Father   . Cancer Sister 32    x2 (liver)  . Diabetes Brother   . Colon cancer Neg Hx   . Pancreatic disease Neg Hx     Social History:  reports that  she has been smoking Cigarettes.  She has a 32 pack-year smoking history. She has never used smokeless tobacco. She reports that she drinks about 5.4 oz of alcohol per week. She reports that she does not use illicit drugs.  Allergies:  Allergies  Allergen Reactions  . Aspirin Nausea And Vomiting  . Sulfonamide Derivatives Hives    Medications: I have reviewed the patient's current medications.  Results for orders placed or performed during the hospital encounter of 10/12/14 (from the past 48 hour(s))  Urinalysis, Routine w reflex microscopic     Status: Abnormal   Collection Time: 10/12/14  2:50 PM  Result Value Ref Range   Color, Urine AMBER (A) YELLOW    Comment: BIOCHEMICALS MAY BE AFFECTED BY COLOR   APPearance CLOUDY (A) CLEAR   Specific Gravity, Urine 1.025 1.005 - 1.030   pH 6.0 5.0 - 8.0   Glucose, UA NEGATIVE NEGATIVE mg/dL   Hgb urine dipstick LARGE (A) NEGATIVE   Bilirubin Urine SMALL (A) NEGATIVE   Ketones, ur TRACE (A) NEGATIVE mg/dL   Protein, ur 100 (A) NEGATIVE mg/dL   Urobilinogen, UA 1.0 0.0 - 1.0 mg/dL   Nitrite NEGATIVE NEGATIVE   Leukocytes, UA NEGATIVE NEGATIVE  Urine microscopic-add on     Status: Abnormal   Collection Time: 10/12/14  2:50 PM  Result Value Ref Range   Squamous Epithelial / LPF MANY (A) RARE   WBC, UA 11-20 <3 WBC/hpf   RBC / HPF 11-20 <3 RBC/hpf   Bacteria, UA FEW (A) RARE  CBC     Status: Abnormal   Collection Time: 10/12/14  2:59 PM  Result Value Ref Range   WBC 15.1 (H) 4.0 - 10.5 K/uL   RBC 3.92 3.87 - 5.11 MIL/uL   Hemoglobin 11.6 (L) 12.0 - 15.0 g/dL   HCT 33.6 (L) 36.0 - 46.0 %   MCV 85.7 78.0 - 100.0 fL   MCH 29.6 26.0 - 34.0 pg   MCHC 34.5 30.0 - 36.0 g/dL   RDW 13.3 11.5 - 15.5 %   Platelets 315 150 - 400 K/uL  Comprehensive metabolic panel     Status: Abnormal   Collection Time: 10/12/14  2:59 PM  Result Value Ref Range   Sodium 126 (L) 135 - 145 mmol/L    Comment: Please note change in reference range. DELTA CHECK  NOTED    Potassium 7.9 (HH) 3.5 - 5.1 mmol/L  Comment: Please note change in reference range. DELTA CHECK NOTED RESULT REPEATED AND VERIFIED CRITICAL RESULT CALLED TO, READ BACK BY AND VERIFIED WITH: ANDERSON,S AT 1545 ON 10/12/2014 BY ISLEY,B    Chloride 92 (L) 96 - 112 mEq/L   CO2 15 (L) 19 - 32 mmol/L   Glucose, Bld 74 70 - 99 mg/dL   BUN 99 (H) 6 - 23 mg/dL   Creatinine, Ser 11.63 (H) 0.50 - 1.10 mg/dL   Calcium 8.9 8.4 - 10.5 mg/dL   Total Protein 8.5 (H) 6.0 - 8.3 g/dL   Albumin 2.6 (L) 3.5 - 5.2 g/dL   AST 59 (H) 0 - 37 U/L   ALT 21 0 - 35 U/L   Alkaline Phosphatase 135 (H) 39 - 117 U/L   Total Bilirubin 2.2 (H) 0.3 - 1.2 mg/dL   GFR calc non Af Amer 3 (L) >90 mL/min   GFR calc Af Amer 4 (L) >90 mL/min    Comment: (NOTE) The eGFR has been calculated using the CKD EPI equation. This calculation has not been validated in all clinical situations. eGFR's persistently <90 mL/min signify possible Chronic Kidney Disease.    Anion gap 19 (H) 5 - 15  Type and screen     Status: None   Collection Time: 10/12/14  3:17 PM  Result Value Ref Range   ABO/RH(D) A POS    Antibody Screen NEG    Sample Expiration 10/15/2014   Lactic acid, plasma     Status: None   Collection Time: 10/12/14  3:17 PM  Result Value Ref Range   Lactic Acid, Venous 1.9 0.5 - 2.2 mmol/L  Ammonia     Status: None   Collection Time: 10/12/14  3:17 PM  Result Value Ref Range   Ammonia 31 11 - 32 umol/L    Comment: Please note change in reference range.  Lipase, blood     Status: None   Collection Time: 10/12/14  5:00 PM  Result Value Ref Range   Lipase 15 11 - 59 U/L  Ethanol     Status: None   Collection Time: 10/12/14  5:00 PM  Result Value Ref Range   Alcohol, Ethyl (B) <5 0 - 9 mg/dL    Comment:        LOWEST DETECTABLE LIMIT FOR SERUM ALCOHOL IS 11 mg/dL FOR MEDICAL PURPOSES ONLY   Protime-INR     Status: None   Collection Time: 10/12/14  5:00 PM  Result Value Ref Range   Prothrombin  Time 14.6 11.6 - 15.2 seconds   INR 1.13 0.00 - 1.49  Troponin I     Status: None   Collection Time: 10/12/14  5:00 PM  Result Value Ref Range   Troponin I 0.03 <0.031 ng/mL    Comment:        NO INDICATION OF MYOCARDIAL INJURY. Please note change in reference range.   MRSA PCR Screening     Status: None   Collection Time: 10/12/14  8:00 PM  Result Value Ref Range   MRSA by PCR NEGATIVE NEGATIVE    Comment:        The GeneXpert MRSA Assay (FDA approved for NASAL specimens only), is one component of a comprehensive MRSA colonization surveillance program. It is not intended to diagnose MRSA infection nor to guide or monitor treatment for MRSA infections.   Basic metabolic panel     Status: Abnormal   Collection Time: 10/12/14  8:45 PM  Result Value Ref Range  Sodium 131 (L) 135 - 145 mmol/L    Comment: Please note change in reference range.   Potassium 5.5 (H) 3.5 - 5.1 mmol/L    Comment: Please note change in reference range. DELTA CHECK NOTED    Chloride 99 96 - 112 mEq/L   CO2 15 (L) 19 - 32 mmol/L   Glucose, Bld 98 70 - 99 mg/dL   BUN 76 (H) 6 - 23 mg/dL   Creatinine, Ser 6.99 (H) 0.50 - 1.10 mg/dL    Comment: DELTA CHECK NOTED   Calcium 8.9 8.4 - 10.5 mg/dL   GFR calc non Af Amer 6 (L) >90 mL/min   GFR calc Af Amer 7 (L) >90 mL/min    Comment: (NOTE) The eGFR has been calculated using the CKD EPI equation. This calculation has not been validated in all clinical situations. eGFR's persistently <90 mL/min signify possible Chronic Kidney Disease.    Anion gap 17 (H) 5 - 15  CBC with Differential     Status: Abnormal   Collection Time: 10/12/14  8:45 PM  Result Value Ref Range   WBC 13.2 (H) 4.0 - 10.5 K/uL   RBC 3.72 (L) 3.87 - 5.11 MIL/uL   Hemoglobin 10.8 (L) 12.0 - 15.0 g/dL   HCT 32.3 (L) 36.0 - 46.0 %   MCV 86.8 78.0 - 100.0 fL   MCH 29.0 26.0 - 34.0 pg   MCHC 33.4 30.0 - 36.0 g/dL   RDW 13.3 11.5 - 15.5 %   Platelets 292 150 - 400 K/uL    Neutrophils Relative % 79 (H) 43 - 77 %   Neutro Abs 10.4 (H) 1.7 - 7.7 K/uL   Lymphocytes Relative 10 (L) 12 - 46 %   Lymphs Abs 1.4 0.7 - 4.0 K/uL   Monocytes Relative 11 3 - 12 %   Monocytes Absolute 1.5 (H) 0.1 - 1.0 K/uL   Eosinophils Relative 0 0 - 5 %   Eosinophils Absolute 0.0 0.0 - 0.7 K/uL   Basophils Relative 0 0 - 1 %   Basophils Absolute 0.0 0.0 - 0.1 K/uL  Basic metabolic panel     Status: Abnormal   Collection Time: 10/12/14 10:50 PM  Result Value Ref Range   Sodium 130 (L) 135 - 145 mmol/L    Comment: Please note change in reference range.   Potassium 4.2 3.5 - 5.1 mmol/L    Comment: Please note change in reference range. DELTA CHECK NOTED    Chloride 100 96 - 112 mEq/L   CO2 15 (L) 19 - 32 mmol/L   Glucose, Bld 149 (H) 70 - 99 mg/dL   BUN 58 (H) 6 - 23 mg/dL   Creatinine, Ser 3.75 (H) 0.50 - 1.10 mg/dL    Comment: DELTA CHECK NOTED   Calcium 8.8 8.4 - 10.5 mg/dL   GFR calc non Af Amer 13 (L) >90 mL/min   GFR calc Af Amer 15 (L) >90 mL/min    Comment: (NOTE) The eGFR has been calculated using the CKD EPI equation. This calculation has not been validated in all clinical situations. eGFR's persistently <90 mL/min signify possible Chronic Kidney Disease.    Anion gap 15 5 - 15  Comprehensive metabolic panel     Status: Abnormal   Collection Time: 10/13/14  4:50 AM  Result Value Ref Range   Sodium 137 135 - 145 mmol/L    Comment: DELTA CHECK NOTED Please note change in reference range.    Potassium 4.4 3.5 - 5.1  mmol/L    Comment: Please note change in reference range.   Chloride 104 96 - 112 mEq/L   CO2 21 19 - 32 mmol/L   Glucose, Bld 129 (H) 70 - 99 mg/dL   BUN 47 (H) 6 - 23 mg/dL   Creatinine, Ser 2.18 (H) 0.50 - 1.10 mg/dL   Calcium 9.1 8.4 - 10.5 mg/dL   Total Protein 7.7 6.0 - 8.3 g/dL   Albumin 2.5 (L) 3.5 - 5.2 g/dL   AST 82 (H) 0 - 37 U/L   ALT 25 0 - 35 U/L   Alkaline Phosphatase 112 39 - 117 U/L   Total Bilirubin 1.7 (H) 0.3 - 1.2 mg/dL    GFR calc non Af Amer 25 (L) >90 mL/min   GFR calc Af Amer 29 (L) >90 mL/min    Comment: (NOTE) The eGFR has been calculated using the CKD EPI equation. This calculation has not been validated in all clinical situations. eGFR's persistently <90 mL/min signify possible Chronic Kidney Disease.    Anion gap 12 5 - 15  CBC     Status: Abnormal   Collection Time: 10/13/14  4:50 AM  Result Value Ref Range   WBC 12.1 (H) 4.0 - 10.5 K/uL   RBC 3.99 3.87 - 5.11 MIL/uL   Hemoglobin 11.6 (L) 12.0 - 15.0 g/dL   HCT 34.5 (L) 36.0 - 46.0 %   MCV 86.5 78.0 - 100.0 fL   MCH 29.1 26.0 - 34.0 pg   MCHC 33.6 30.0 - 36.0 g/dL   RDW 13.5 11.5 - 15.5 %   Platelets 289 150 - 400 K/uL    Ct Head Wo Contrast  10/12/2014   CLINICAL DATA:  Confusion, vomiting, altered mental status.  EXAM: CT HEAD WITHOUT CONTRAST  TECHNIQUE: Contiguous axial images were obtained from the base of the skull through the vertex without intravenous contrast.  COMPARISON:  Head CT 11/18/2009  FINDINGS: No acute intracranial hemorrhage. No focal mass lesion. No CT evidence of acute infarction. No midline shift or mass effect. No hydrocephalus. Basilar cisterns are patent. Paranasal sinuses and mastoid air cells are clear.  IMPRESSION: 1. No acute intracranial findings.  No change from prior.   Electronically Signed   By: Suzy Bouchard M.D.   On: 10/12/2014 16:54   Dg Chest Port 1 View  10/12/2014   CLINICAL DATA:  Vomiting  EXAM: PORTABLE CHEST - 1 VIEW  COMPARISON:  03/08/2014  FINDINGS: Lordotic positioning. Mild bibasilar atelectasis. No definite pneumonia. Negative for heart failure or effusion.  IMPRESSION: Mild bibasilar atelectasis.   Electronically Signed   By: Franchot Gallo M.D.   On: 10/12/2014 16:36   Dg Abd Portable 1v  10/12/2014   CLINICAL DATA:  Vomiting  EXAM: PORTABLE ABDOMEN - 1 VIEW  COMPARISON:  CT abdomen pelvis 05/29/2013  FINDINGS: Image quality is suboptimal due to technique and large patient size   Nonobstructive bowel gas pattern.  No dilated bowel loops.  Advanced degenerative change in both hip joints.  IMPRESSION: Negative.   Electronically Signed   By: Franchot Gallo M.D.   On: 10/12/2014 16:37    Review of Systems  Unable to perform ROS: mental acuity   Blood pressure 128/101, pulse 119, temperature 99.6 F (37.6 C), temperature source Core (Comment), resp. rate 24, height 5' 5" (1.651 m), weight 66.3 kg (146 lb 2.6 oz), SpO2 100 %. Physical Exam  Constitutional: No distress.  Neck: No JVD present.  Cardiovascular: Normal rate and regular rhythm.  Respiratory: No respiratory distress. She has no wheezes. She has no rales.  GI: There is no tenderness.  Musculoskeletal: She exhibits edema.  Neurological:  Patient awake but confused. Presently she is not able to tell me where she is in her home address.    Assessment/Plan: Problem #1 acute kidney injury: Seems to be secondary to obstructive uropathy. Her BUN and creatinine has come down significantly. Patient had about 7.8 L of urine output since she came. Etiology for his obstruction at this moment is no clear. Problem #2 hyperkalemia: Her potassium was corrected Problem #3 metabolic acidosis her CO2 has improved Problem #4 anemia Problem #5 history of GERD: Patient was seen by GI and she had upper endoscopy recently. Problem #6 bipolar disorder Problem #7 HIV Problem #8 hypertension: Her blood pressure is reasonably controlled Problem #9 confusion: At this moment etiology not clear as she doesn't so any sign of improvement associated with her renal function improvement. Plan: We'll change her IV fluid to D5 half-normal saline at 135 mL per hour We'll check her basic metabolic panel in the morning.  , S 10/13/2014, 9:35 AM

## 2014-10-13 NOTE — Telephone Encounter (Addendum)
Spoke with Dr. Roderic Palau (patient's attending) to notify about untreated candida esophagitis. Patient was admitted with acute renal failure and encephalopathy. He will consider starting Diflucan 100mg  daily for 14 days once patient stabilizes.   I will follow up next week to determine if patient was able to start treatment.

## 2014-10-13 NOTE — Telephone Encounter (Signed)
Mailed letter to pt, asking her to call the office.

## 2014-10-14 ENCOUNTER — Inpatient Hospital Stay (HOSPITAL_COMMUNITY): Payer: Medicare Other

## 2014-10-14 LAB — BASIC METABOLIC PANEL
ANION GAP: 12 (ref 5–15)
BUN: 34 mg/dL — ABNORMAL HIGH (ref 6–23)
CHLORIDE: 108 meq/L (ref 96–112)
CO2: 19 mmol/L (ref 19–32)
Calcium: 8 mg/dL — ABNORMAL LOW (ref 8.4–10.5)
Creatinine, Ser: 1.11 mg/dL — ABNORMAL HIGH (ref 0.50–1.10)
GFR calc Af Amer: 65 mL/min — ABNORMAL LOW (ref >90)
GFR calc non Af Amer: 56 mL/min — ABNORMAL LOW (ref >90)
GLUCOSE: 122 mg/dL — AB (ref 70–99)
Potassium: 3.7 mmol/L (ref 3.5–5.1)
SODIUM: 139 mmol/L (ref 135–145)

## 2014-10-14 LAB — CBC
HEMATOCRIT: 30.3 % — AB (ref 36.0–46.0)
Hemoglobin: 9.8 g/dL — ABNORMAL LOW (ref 12.0–15.0)
MCH: 29.2 pg (ref 26.0–34.0)
MCHC: 32.3 g/dL (ref 30.0–36.0)
MCV: 90.2 fL (ref 78.0–100.0)
Platelets: 215 10*3/uL (ref 150–400)
RBC: 3.36 MIL/uL — ABNORMAL LOW (ref 3.87–5.11)
RDW: 13.8 % (ref 11.5–15.5)
WBC: 21.7 10*3/uL — AB (ref 4.0–10.5)

## 2014-10-14 LAB — CRYPTOCOCCAL ANTIGEN, CSF: CRYPTO AG: NEGATIVE

## 2014-10-14 LAB — CSF CELL COUNT WITH DIFFERENTIAL
RBC COUNT CSF: 0 /mm3
Tube #: 4
WBC, CSF: 0 /mm3 (ref 0–5)

## 2014-10-14 LAB — PROTEIN, CSF: Total  Protein, CSF: 29 mg/dL (ref 15–45)

## 2014-10-14 LAB — GLUCOSE, CSF: Glucose, CSF: 73 mg/dL (ref 43–76)

## 2014-10-14 MED ORDER — SODIUM CHLORIDE 0.9 % IV SOLN
500.0000 mg | Freq: Three times a day (TID) | INTRAVENOUS | Status: DC
  Administered 2014-10-14 – 2014-10-17 (×9): 500 mg via INTRAVENOUS
  Filled 2014-10-14 (×13): qty 500

## 2014-10-14 NOTE — Progress Notes (Signed)
Notified the MD that the patient is alert, having conversation with her family, and able to answer questions I ask her appropriately.  The patient is requesting water.  New orders given and followed.  The MD stated he would come back and see the patient prior to ordering a diet.  Patient and family is aware that the patient is to lay flat x4 hour hours.

## 2014-10-14 NOTE — Care Management Note (Addendum)
    Page 1 of 2   11/05/2014     11:24:01 AM CARE MANAGEMENT NOTE 11/05/2014  Patient:  [name], [name]   Account Number:  [id number]  Date Initiated:  10/14/2014  Documentation initiated by:  Jolene Provost  Subjective/Objective Assessment:   Pt admitted from home with husband with AKI. Pt active with [facility] with an aid. Pt plans to discharge home with [facility] seriv[facility] at discharge. No CM needs identified at this time.     Action/Plan:   pt eval- rec snf   Anticipated DC Date:  11/05/2014   Anticipated DC Plan:  SKILLED NURSING FACILITY  In-house referral  Clinical Social Worker      DC Planning Services  CM consult      Choice offered to / List presented to:             Status of service:  Completed, signed off Medicare Important Message given?  YES (If response is "NO", the following Medicare IM given date fields will be blank) Date Medicare IM given:  10/29/2014 Medicare IM given by:  MAYO,HENRIETTA Date Additional Medicare IM given:  11/05/2014 Additional Medicare IM given by:  Tomi Bamberger  Discharge Disposition:  [place]  Per UR Regulation:  Reviewed for med. necessity/level of care/duration of stay  If discussed at [place] of Stay Meetings, dates discussed:   10/19/2014  10/26/2014    Comments:  ContactMady Gemma Sister [phone]  [phone]    Lewis,Samantha Niece   [phone]   Wade,Christina Niece   [phone]   Petito,Angela Niece   [phone]   Gunn,George Relative   11/05/14 [place], BSN [phone] patient is for dc today, going to [facility] SNF. CSW following.  11/02/13 1130 [place] MSN BSN CCM CSW following-pt will need [facility] SNF for rehab.     10/29/13 [place] RN MSN BSN CCM Transferred to [place].  Per PT, pt will need SNF for rehab.  10-26-14 9:30am Luz [place], RNBSN [phone] [facility]  Remains on vent. Extubated late morning - confused.  10/20/2014 [place], RN, MSN, PCCN Pt does not want to go to SNF per pt's recommendations. Pt will discharge home with HH. Romualdo Bolk of [facility], per pt's choice has been notified of referral and will obtain information from pt's chart. No further CM needs at this time.  10/18/2014 [place], RN, MSN, PCCN Pt plans to go to [facility] per PT's recommendations. CSW notified of discharge plan and will arrange for placement. No CM needs at this time.  10/14/2014 [place], RN, MSN, [facility]

## 2014-10-14 NOTE — Progress Notes (Addendum)
Subjective: Interval History: none.  Objective: Vital signs in last 24 hours: Temp:  [98.3 F (36.8 C)-102.3 F (39.1 C)] 98.3 F (36.8 C) (12/24 0400) Pulse Rate:  [112-124] 121 (12/24 0500) Resp:  [16-42] 24 (12/24 0500) BP: (99-135)/(63-115) 114/70 mmHg (12/24 0500) SpO2:  [91 %-99 %] 97 % (12/24 0500) Weight:  [66.225 kg (146 lb)-68.7 kg (151 lb 7.3 oz)] 68.7 kg (151 lb 7.3 oz) (12/24 0430) Weight change: 2.6 kg (5 lb 11.7 oz)  Intake/Output from previous day: 12/23 0701 - 12/24 0700 In: 4973.7 [I.V.:3423.7; IV Piggyback:1550] Out: 4259 [Urine:1175; Emesis/NG output:4] Intake/Output this shift:    The patient very somnolent and barely arousable. Chest is clear auscultation Heart exam regular rate and rhythm no murmur Extremities no edema  Lab Results:  Recent Labs  10/13/14 0450 10/14/14 0537  WBC 12.1* 21.7*  HGB 11.6* 9.8*  HCT 34.5* 30.3*  PLT 289 215   BMET:  Recent Labs  10/13/14 1123 10/14/14 0537  NA 137 139  K 3.9 3.7  CL 101 108  CO2 24 19  GLUCOSE 132* 122*  BUN 42* 34*  CREATININE 1.31* 1.11*  CALCIUM 8.9 8.0*   No results for input(s): PTH in the last 72 hours. [place] Studies: No results for input(s): IRON, TIBC, TRANSFERRIN, FERRITIN in the last 72 hours.  Studies/Results: Dg Chest 1 View  10/13/2014   CLINICAL DATA:  Vomiting, cough, wheezing  EXAM: CHEST - 1 VIEW  COMPARISON:  10/12/2014  FINDINGS: There is patchy left interstitial and alveolar airspace opacities. There is mild right lung interstitial thickening. There is no pleural effusion or pneumothorax. Stable cardiomediastinal silhouette. Right shoulder arthroplasty. 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  IMPRESSION: Patchy interstitial and alveolar left lung opacities and mild right lung interstitial disease. Overall appearance may reflect atypical infection given the patient's medical history versus aspiration versus atypical pulmonary edema.   Electronically Signed   By:  Kathreen Devoid   On: 10/13/2014 19:44   Ct Head Wo Contrast  10/12/2014   CLINICAL DATA:  Confusion, vomiting, altered mental status.  EXAM: CT HEAD WITHOUT CONTRAST  TECHNIQUE: Contiguous axial images were obtained from the base of the skull through the vertex without intravenous contrast.  COMPARISON:  Head CT 11/18/2009  FINDINGS: No acute intracranial hemorrhage. No focal mass lesion. No CT evidence of acute infarction. No midline shift or mass effect. No hydrocephalus. Basilar cisterns are patent. Paranasal sinuses and mastoid air cells are clear.  IMPRESSION: 1. No acute intracranial findings.  No change from prior.   Electronically Signed   By: Suzy Bouchard M.D.   On: 10/12/2014 16:54   Mr Brain Wo Contrast  10/13/2014   CLINICAL DATA:  53 year old female with HIV, altered mental status. Agitated. Initial encounter.  EXAM: MRI HEAD WITHOUT CONTRAST  TECHNIQUE: Multiplanar, multiecho pulse sequences of the brain and surrounding structures were obtained without intravenous contrast.  COMPARISON:  Head CT without contrast 10/12/2014. Brain MRI 08/16/2011.  FINDINGS: Study is intermittently degraded by motion artifact despite repeated imaging attempts.  Stable cerebral volume since 2012. Major intracranial vascular flow voids are grossly stable. No restricted diffusion to suggest acute infarction. No midline shift, mass effect, evidence of mass lesion, ventriculomegaly, extra-axial collection or acute intracranial hemorrhage. Cervicomedullary junction and pituitary are within normal limits. Allowing for motion, gray and white matter signal throughout the brain appears stable and within normal limits for age. No contrast administered.  There is fluid or mucosal thickening in the sphenoid sinuses  which is new. Other Visualized paranasal sinuses and mastoids are clear. Grossly negative orbit and scalp soft tissues. Grossly normal bone marrow signal.  IMPRESSION: Degraded by motion despite repeated imaging  attempts, but this noncontrast MRI brain appears stable compared to 2012 and within normal limits for age.   Electronically Signed   By: Lars Pinks M.D.   On: 10/13/2014 13:30   US Renal  10/13/2014   CLINICAL DATA:  Patient HIV positive. Renal failure. Urinary frequency and incontinence. History of uterine carcinoma.  EXAM: RENAL/URINARY TRACT ULTRASOUND COMPLETE  COMPARISON:  CT, 05/29/2013  FINDINGS: Right Kidney:  Length: 13.1 cm. Echogenicity within normal limits. No mass or hydronephrosis visualized.  Left Kidney:  Length: 12.0 cm. Echogenicity within normal limits. No mass or hydronephrosis visualized.  Bladder:  Nondistended and not well evaluated.  IMPRESSION: Normal renal ultrasound.  No hydronephrosis.   Electronically Signed   By: Lajean Manes M.D.   On: 10/13/2014 13:29   Dg Chest Port 1 View  10/12/2014   CLINICAL DATA:  Vomiting  EXAM: PORTABLE CHEST - 1 VIEW  COMPARISON:  03/08/2014  FINDINGS: Lordotic positioning. Mild bibasilar atelectasis. No definite pneumonia. Negative for heart failure or effusion.  IMPRESSION: Mild bibasilar atelectasis.   Electronically Signed   By: Franchot Gallo M.D.   On: 10/12/2014 16:36   Dg Abd Portable 1v  10/12/2014   CLINICAL DATA:  Vomiting  EXAM: PORTABLE ABDOMEN - 1 VIEW  COMPARISON:  CT abdomen pelvis 05/29/2013  FINDINGS: Image quality is suboptimal due to technique and large patient size  Nonobstructive bowel gas pattern.  No dilated bowel loops.  Advanced degenerative change in both hip joints.  IMPRESSION: Negative.   Electronically Signed   By: Franchot Gallo M.D.   On: 10/12/2014 16:37    I have reviewed the patient's current medications.  Assessment/Plan: Problem #1 acute kidney injury: Her BUN and creatinine is 34 and 1.1 her renal function has significantly improved. Problem #2 hyperkalemia potassium has corrected Problem #3 hypertension: Her blood pressure is reasonably controlled Problem #4 altered mental status: Etiology not  clear. Presently patient doesn't show significant improvement. Problem #5 bipolar disorder Problem #6 history of HIV Problem #7 urea tract infection: Presently patient is febrile with elevated white cell count.  Plan: Decrease IV fluid to 100 mL per hour Since her renal function improved I will sign off . Thank you We'll check her basic metabolic panel in the morning.   LOS: 2 days   Jaydee Conran S 10/14/2014,7:39 AM

## 2014-10-14 NOTE — Progress Notes (Signed)
[facility] PROGRESS NOTE  [name] EOF:[id number] DOB: October 29, 1960 DOA: 10/12/2014 PCP: Barbette Merino, MD  Assessment/Plan: 1. Acute renal failure. Creatinine on admission was 11.63. With placement of Foley catheter she had approximately 7.8 L of urine output within first 24 hours of admission. This would indicate a postobstructive uropathy. Renal ultrasound done does not show any evidence of hydronephrosis or other acute findings. Continue foley catheter for now . Creatinine has now improved to 1.11. Continue IV fluids. Appreciate nephrology input. 2. Sepsis. Patient has started to have fevers and developed a significant leukocytosis to 21K. Urine culture has shown no growth. Blood cultures have shown no growth yet. Patient remains lethargic since admission, despite improvement in her renal function. She is on broad spectrum antibiotics. Without improvement in her mental status and worsening in her overall condition, I think a lumbar puncture would be reasonable.  3. Hyperkalemia. Related to #1. Resolved. 4. Acute encephalopathy. Possibly related to developing sepsis, versus residual encephalopathy from renal failure. Urine drug screen appropriately positive for benzos. ABG, ammonia unremarkable. MRI of the brain did not show any acute findings. Xanax and oxycodone that she was taking prior to admission have been discontinued.  5. HIV. Recent CD4 count from 06/2014 was 1330 indicating good compliance. She is on antiretroviral therapy. We'll restart these once her mental status improves. 6. Hypertension. Patient takes clonidine, verapamil as an outpatient. Blood pressure meds currently on hold since she is NPO due to lethargy  Code Status: full code Family Communication: no family present Disposition Plan: pending hospital course   Consultants:  Nephrology  Procedures:    Antibiotics:  Vancomycin 12/23>>  Zosyn 12/23>>  HPI/Subjective: Patient does not provide any history,  very somnolent  Objective: Filed Vitals:   10/14/14 0801  BP:   Pulse:   Temp: 99.5 F (37.5 C)  Resp:     Intake/Output Summary (Last 24 hours) at 10/14/14 0856 Last data filed at 10/14/14 0400  Gross per 24 hour  Intake 4773.67 ml  Output    854 ml  Net 3919.67 ml   Filed Weights   10/12/14 2000 10/13/14 0500 10/14/14 0430  Weight: 66.1 kg (145 lb 11.6 oz) 66.3 kg (146 lb 2.6 oz) 68.7 kg (151 lb 7.3 oz)    Exam:   General:  NAD, somnolent, briefly opens eyes, but does not answer questions  Cardiovascular: s1, s2, tachycardic  Respiratory: cta b  Abdomen: soft, diffusely tender, nd, bs+  Musculoskeletal:  No edema b/l   Data Reviewed: Basic Metabolic Panel:  Recent Labs Lab 10/12/14 2045 10/12/14 2250 10/13/14 0450 10/13/14 1123 10/14/14 0537  NA 131* 130* 137 137 139  K 5.5* 4.2 4.4 3.9 3.7  CL 99 100 104 101 108  CO2 15* 15* 21 24 19   GLUCOSE 98 149* 129* 132* 122*  BUN 76* 58* 47* 42* 34*  CREATININE 6.99* 3.75* 2.18* 1.31* 1.11*  CALCIUM 8.9 8.8 9.1 8.9 8.0*   Liver Function Tests:  Recent Labs Lab 10/12/14 1459 10/13/14 0450  AST 59* 82*  ALT 21 25  ALKPHOS 135* 112  BILITOT 2.2* 1.7*  PROT 8.5* 7.7  ALBUMIN 2.6* 2.5*    Recent Labs Lab 10/12/14 1700  LIPASE 15    Recent Labs Lab 10/12/14 1517 10/13/14 1123  AMMONIA 31 38*   CBC:  Recent Labs Lab 10/12/14 1459 10/12/14 2045 10/13/14 0450 10/14/14 0537  WBC 15.1* 13.2* 12.1* 21.7*  NEUTROABS  --  10.4*  --   --   HGB 11.6*  10.8* 11.6* 9.8*  HCT 33.6* 32.3* 34.5* 30.3*  MCV 85.7 86.8 86.5 90.2  PLT 315 292 289 215   Cardiac Enzymes:  Recent Labs Lab 10/12/14 1700  TROPONINI 0.03   BNP (last 3 results) No results for input(s): PROBNP in the last 8760 hours. CBG: No results for input(s): GLUCAP in the last 168 hours.  Recent Results (from the past 240 hour(s))  Urine culture     Status: None   Collection Time: 10/12/14  2:50 PM  Result Value Ref Range  Status   Specimen Description URINE, CATHETERIZED  Final   Special Requests NONE  Final   Culture  Setup Time   Final    10/13/2014 00:54 Performed at [place] Performed at [facility]   Final   Culture NO GROWTH Performed at [facility]   Final   Report Status 10/13/2014 FINAL  Final  Culture, blood (routine x 2)     Status: None (Preliminary result)   Collection Time: 10/12/14  3:17 PM  Result Value Ref Range Status   Specimen Description BLOOD RIGHT HAND  Final   Special Requests BOTTLES DRAWN AEROBIC ONLY 5CC  Final   Culture NO GROWTH 1 DAY  Final   Report Status PENDING  Incomplete  Culture, blood (routine x 2)     Status: None (Preliminary result)   Collection Time: 10/12/14  5:00 PM  Result Value Ref Range Status   Specimen Description BLOOD RIGHT HAND  Final   Special Requests BOTTLES DRAWN AEROBIC ONLY 5CC  Final   Culture NO GROWTH 1 DAY  Final   Report Status PENDING  Incomplete  MRSA PCR Screening     Status: None   Collection Time: 10/12/14  8:00 PM  Result Value Ref Range Status   MRSA by PCR NEGATIVE NEGATIVE Final    Comment:        The GeneXpert MRSA Assay (FDA approved for NASAL specimens only), is one component of a comprehensive MRSA colonization surveillance program. It is not intended to diagnose MRSA infection nor to guide or monitor treatment for MRSA infections.      Studies: Dg Chest 1 View  10/13/2014   CLINICAL DATA:  Vomiting, cough, wheezing  EXAM: CHEST - 1 VIEW  COMPARISON:  10/12/2014  FINDINGS: There is patchy left interstitial and alveolar airspace opacities. There is mild right lung interstitial thickening. There is no pleural effusion or pneumothorax. Stable cardiomediastinal silhouette. Right shoulder arthroplasty. 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  IMPRESSION: Patchy interstitial and alveolar left lung opacities and mild right lung interstitial disease. Overall  appearance may reflect atypical infection given the patient's medical history versus aspiration versus atypical pulmonary edema.   Electronically Signed   By: Kathreen Devoid   On: 10/13/2014 19:44   Ct Head Wo Contrast  10/12/2014   CLINICAL DATA:  Confusion, vomiting, altered mental status.  EXAM: CT HEAD WITHOUT CONTRAST  TECHNIQUE: Contiguous axial images were obtained from the base of the skull through the vertex without intravenous contrast.  COMPARISON:  Head CT 11/18/2009  FINDINGS: No acute intracranial hemorrhage. No focal mass lesion. No CT evidence of acute infarction. No midline shift or mass effect. No hydrocephalus. Basilar cisterns are patent. Paranasal sinuses and mastoid air cells are clear.  IMPRESSION: 1. No acute intracranial findings.  No change from prior.   Electronically Signed   By: Suzy Bouchard M.D.   On: 10/12/2014 16:54  Mr Brain Wo Contrast  10/13/2014   CLINICAL DATA:  53 year old female with HIV, altered mental status. Agitated. Initial encounter.  EXAM: MRI HEAD WITHOUT CONTRAST  TECHNIQUE: Multiplanar, multiecho pulse sequences of the brain and surrounding structures were obtained without intravenous contrast.  COMPARISON:  Head CT without contrast 10/12/2014. Brain MRI 08/16/2011.  FINDINGS: Study is intermittently degraded by motion artifact despite repeated imaging attempts.  Stable cerebral volume since 2012. Major intracranial vascular flow voids are grossly stable. No restricted diffusion to suggest acute infarction. No midline shift, mass effect, evidence of mass lesion, ventriculomegaly, extra-axial collection or acute intracranial hemorrhage. Cervicomedullary junction and pituitary are within normal limits. Allowing for motion, gray and white matter signal throughout the brain appears stable and within normal limits for age. No contrast administered.  There is fluid or mucosal thickening in the sphenoid sinuses which is new. Other Visualized paranasal sinuses and  mastoids are clear. Grossly negative orbit and scalp soft tissues. Grossly normal bone marrow signal.  IMPRESSION: Degraded by motion despite repeated imaging attempts, but this noncontrast MRI brain appears stable compared to 2012 and within normal limits for age.   Electronically Signed   By: Lars Pinks M.D.   On: 10/13/2014 13:30   US Renal  10/13/2014   CLINICAL DATA:  Patient HIV positive. Renal failure. Urinary frequency and incontinence. History of uterine carcinoma.  EXAM: RENAL/URINARY TRACT ULTRASOUND COMPLETE  COMPARISON:  CT, 05/29/2013  FINDINGS: Right Kidney:  Length: 13.1 cm. Echogenicity within normal limits. No mass or hydronephrosis visualized.  Left Kidney:  Length: 12.0 cm. Echogenicity within normal limits. No mass or hydronephrosis visualized.  Bladder:  Nondistended and not well evaluated.  IMPRESSION: Normal renal ultrasound.  No hydronephrosis.   Electronically Signed   By: Lajean Manes M.D.   On: 10/13/2014 13:29   Dg Chest Port 1 View  10/12/2014   CLINICAL DATA:  Vomiting  EXAM: PORTABLE CHEST - 1 VIEW  COMPARISON:  03/08/2014  FINDINGS: Lordotic positioning. Mild bibasilar atelectasis. No definite pneumonia. Negative for heart failure or effusion.  IMPRESSION: Mild bibasilar atelectasis.   Electronically Signed   By: Franchot Gallo M.D.   On: 10/12/2014 16:36   Dg Abd Portable 1v  10/12/2014   CLINICAL DATA:  Vomiting  EXAM: PORTABLE ABDOMEN - 1 VIEW  COMPARISON:  CT abdomen pelvis 05/29/2013  FINDINGS: Image quality is suboptimal due to technique and large patient size  Nonobstructive bowel gas pattern.  No dilated bowel loops.  Advanced degenerative change in both hip joints.  IMPRESSION: Negative.   Electronically Signed   By: Franchot Gallo M.D.   On: 10/12/2014 16:37    Scheduled Meds: . cloNIDine  0.1 mg Oral QHS  . piperacillin-tazobactam (ZOSYN)  IV  3.375 g Intravenous Q8H  . sodium chloride  3 mL Intravenous Q12H  . vancomycin  750 mg Intravenous Q12H  .  verapamil  240 mg Oral Daily   Continuous Infusions: . dextrose 5 % and 0.45% NaCl 135 mL/hr at 10/13/14 2345    Active Problems:   Human immunodeficiency virus (HIV) disease   Essential hypertension   Bipolar 1 disorder   Renal failure   Acute renal failure   Acute encephalopathy   UTI (urinary tract infection)    Time spent: 74mins    Anara Cowman  [facility] Pager [phone]. If 7PM-7AM, please contact night-coverage at [email or web address], password [facility] 10/14/2014, 8:56 AM  LOS: 2 days

## 2014-10-14 NOTE — Progress Notes (Signed)
ANTIBIOTIC CONSULT NOTE - follow up  Pharmacy Consult for Vancomycin and Doripenem Indication: pneumonia  Allergies  Allergen Reactions  . Aspirin Nausea And Vomiting  . Sulfonamide Derivatives Hives   Patient Measurements: Height: 5\' 5"  (165.1 cm) Weight: 151 lb 7.3 oz (68.7 kg) IBW/kg (Calculated) : 57  Vital Signs: Temp: 99.5 F (37.5 C) (12/24 0801) Temp Source: Axillary (12/24 0801) BP: 114/70 mmHg (12/24 0500) Pulse Rate: 121 (12/24 0500) Intake/Output from previous day: 12/23 0701 - 12/24 0700 In: 4973.7 [I.V.:3423.7; IV Piggyback:1550] Out: 9735 [HGDJM:4268; Emesis/NG output:4] Intake/Output from this shift:    Labs:  Recent Labs  10/12/14 2045  10/13/14 0450 10/13/14 1123 10/14/14 0537  WBC 13.2*  --  12.1*  --  21.7*  HGB 10.8*  --  11.6*  --  9.8*  PLT 292  --  289  --  215  CREATININE 6.99*  < > 2.18* 1.31* 1.11*  < > = values in this interval not displayed. Estimated Creatinine Clearance: 58.4 mL/min (by C-G formula based on Cr of 1.11). No results for input(s): VANCOTROUGH, VANCOPEAK, VANCORANDOM, GENTTROUGH, GENTPEAK, GENTRANDOM, TOBRATROUGH, TOBRAPEAK, TOBRARND, AMIKACINPEAK, AMIKACINTROU, AMIKACIN in the last 72 hours.   Microbiology: Recent Results (from the past 720 hour(s))  Urine culture     Status: None   Collection Time: 10/12/14  2:50 PM  Result Value Ref Range Status   Specimen Description URINE, CATHETERIZED  Final   Special Requests NONE  Final   Culture  Setup Time   Final    10/13/2014 00:54 Performed at [facility] Performed at [facility]   Final   Culture NO GROWTH Performed at [facility]   Final   Report Status 10/13/2014 FINAL  Final  Culture, blood (routine x 2)     Status: None (Preliminary result)   Collection Time: 10/12/14  3:17 PM  Result Value Ref Range Status   Specimen Description BLOOD RIGHT HAND  Final   Special Requests BOTTLES DRAWN AEROBIC ONLY 5CC   Final   Culture NO GROWTH 2 DAYS  Final   Report Status PENDING  Incomplete  Culture, blood (routine x 2)     Status: None (Preliminary result)   Collection Time: 10/12/14  5:00 PM  Result Value Ref Range Status   Specimen Description BLOOD RIGHT HAND  Final   Special Requests BOTTLES DRAWN AEROBIC ONLY 5CC  Final   Culture NO GROWTH 2 DAYS  Final   Report Status PENDING  Incomplete  MRSA PCR Screening     Status: None   Collection Time: 10/12/14  8:00 PM  Result Value Ref Range Status   MRSA by PCR NEGATIVE NEGATIVE Final    Comment:        The GeneXpert MRSA Assay (FDA approved for NASAL specimens only), is one component of a comprehensive MRSA colonization surveillance program. It is not intended to diagnose MRSA infection nor to guide or monitor treatment for MRSA infections.    Medical History: Past Medical History  Diagnosis Date  . Baker's cyst   . Pulmonary lesion 1/03    on RUL onn CT   . Hemorrhoids   . Zoster 2001  . Family history of diabetes mellitus   . Tobacco user   . Other abnormal Papanicolaou smear of cervix and cervical HPV(795.09)   . Herpes zoster   . Hemorrhoids   . Baker's cyst   . Pancreatitis   . HIV disease  take Prezista,Truvada,Norvir,and Isentress daily  . Bipolar disorder     With hx of psychotic features.  . Articular cartilage disorder of knee   . Bursitis of shoulder   . Rotator cuff arthropathy   . Hypertension     takes Clonidine and Verapamil daily  . Emphysema   . Asthma     Albuterol resue inhaler and QVAR daily  . Shortness of breath     with exertion and stress  . Dizziness   . Joint pain   . Joint swelling   . Chronic back pain     buldging disc  . Bruises easily   . Gastric ulcer   . GERD (gastroesophageal reflux disease)     takes Nexium daily  . Chronic constipation   . Urinary frequency   . Urinary incontinence   . Nocturia   . History of blood transfusion at age 39  . Early cataracts, bilateral   .  Anxiety   . Depression     takes Xanax daily and Depakote bid  . History of shingles   . 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 Tobacco abuse 10/26/2012  . Chronic back pain   . Chronic knee pain   . Complication of anesthesia     AGGITATED  . Uterine cancer     "had some kind of cancer in my teens; had to have TAH to remove it"  . Headache(784.0)     "often here lately; weather changing"  . Pneumonia     "several times"  . Arthritis     "all over" (03/16/2014)  . Osteoarthritis of left knee 03/16/2014  . Alcohol withdrawal delirium 03/22/2014   Anti-infectives    Start     Dose/Rate Route Frequency Ordered Stop   10/14/14 1400  doripenem (DORIBAX) 500 mg in sodium chloride 0.9 % 100 mL IVPB     500 mg100 mL/hr over 60 Minutes Intravenous 3 times per day 10/14/14 1040     10/14/14 0600  vancomycin (VANCOCIN) IVPB 750 mg/150 ml premix     750 mg150 mL/hr over 60 Minutes Intravenous Every 12 hours 10/13/14 1822     10/13/14 2200  piperacillin-tazobactam (ZOSYN) IVPB 3.375 g  Status:  Discontinued     3.375 g12.5 mL/hr over 240 Minutes Intravenous Every 8 hours 10/13/14 1822 10/14/14 1039   10/13/14 1900  vancomycin (VANCOCIN) 1,250 mg in sodium chloride 0.9 % 250 mL IVPB     1,250 mg166.7 mL/hr over 90 Minutes Intravenous  Once 10/13/14 1822 10/13/14 2109   10/13/14 1230  cefTRIAXone (ROCEPHIN) 1 g in dextrose 5 % 50 mL IVPB - Premix  Status:  Discontinued     1 g100 mL/hr over 30 Minutes Intravenous Every 24 hours 10/13/14 1113 10/14/14 0820     Assessment: 53yo female admitted with ARF and possible sepsis from UTI.  Pt has h/o HIV.  Pt was started on Vancomycin and Zosyn for aspiration pneumonia.  SCr is improving.  Estimated Creatinine Clearance: 58.4 mL/min (by C-G formula based on Cr of 1.11).  Pt is spiking fevers and WBC is worse.  Pt has gone down for LP.  D/W Dr Roderic Palau - received orders to d/c Zosyn and switch to Doripenem.  Vancomycin  12/23 >> Zosyn 12/23 > 12/24 Doripenem 12/24 >>  Goal of Therapy:  Vancomycin trough level 15-20 mcg/ml Avoid adverse effects  Plan:  Doripenem 500mg  IV q8hrs  Continue Vancomycin 750mg  IV q12hrs  Check trough at steady state  Monitor labs, renal fxn, and cultures  Hart Robinsons A 10/14/2014,10:41 AM

## 2014-10-14 NOTE — Procedures (Signed)
Diagnostic lumbar puncture performed in radiology under fluoro.  See dictated radiology report for details.

## 2014-10-14 NOTE — Progress Notes (Signed)
Late Entry 10/14/14 approx 1000  Discussed with Dr. Roderic Palau that the is not able to sign her own consent at this time for her Puncture Procedure.  I voiced to him that her aunt is suppose to be on the way to the hospital according to the patients cousin who is an listed contact in the computer, but currently her niece Levada Dy is here now.  The patient is currently in radiology at this time.  MD stated that it was okay for the patients niece to sign for consent.  I also prior to the procedure notified Dr. Roderic Palau that the patient received Lovenox yesterday about 1648 and that the Radiologist performing the procedure wanted to be sure to notify him since their policy says they normally have to wait 24 hours after blood thinners to do this procedure. The Radiologist asked for the MD to sign the consent that he has given permission for the niece to sign or can actual sign the consent himself.  The MD did sign the consent.

## 2014-10-15 DIAGNOSIS — J690 Pneumonitis due to inhalation of food and vomit: Secondary | ICD-10-CM | POA: Diagnosis present

## 2014-10-15 DIAGNOSIS — A419 Sepsis, unspecified organism: Secondary | ICD-10-CM | POA: Diagnosis present

## 2014-10-15 LAB — BASIC METABOLIC PANEL
Anion gap: 9 (ref 5–15)
BUN: 13 mg/dL (ref 6–23)
CO2: 23 mmol/L (ref 19–32)
CREATININE: 0.49 mg/dL — AB (ref 0.50–1.10)
Calcium: 7.4 mg/dL — ABNORMAL LOW (ref 8.4–10.5)
Chloride: 106 mEq/L (ref 96–112)
GFR calc Af Amer: >90 mL/min (ref >90)
Glucose, Bld: 81 mg/dL (ref 70–99)
Potassium: 2.8 mmol/L — ABNORMAL LOW (ref 3.5–5.1)
Sodium: 138 mmol/L (ref 135–145)

## 2014-10-15 LAB — MAGNESIUM: Magnesium: 0.9 mg/dL — CL (ref 1.5–2.5)

## 2014-10-15 LAB — CBC
HCT: 27.3 % — ABNORMAL LOW (ref 36.0–46.0)
Hemoglobin: 9.1 g/dL — ABNORMAL LOW (ref 12.0–15.0)
MCH: 29.1 pg (ref 26.0–34.0)
MCHC: 33.3 g/dL (ref 30.0–36.0)
MCV: 87.2 fL (ref 78.0–100.0)
PLATELETS: 254 10*3/uL (ref 150–400)
RBC: 3.13 MIL/uL — AB (ref 3.87–5.11)
RDW: 13.8 % (ref 11.5–15.5)
WBC: 20.7 10*3/uL — ABNORMAL HIGH (ref 4.0–10.5)

## 2014-10-15 MED ORDER — EMTRICITABINE-TENOFOVIR DF 200-300 MG PO TABS
1.0000 | ORAL_TABLET | Freq: Every day | ORAL | Status: DC
  Administered 2014-10-15 – 2014-10-21 (×7): 1 via ORAL
  Filled 2014-10-15 (×12): qty 1

## 2014-10-15 MED ORDER — SODIUM CHLORIDE 0.9 % IV BOLUS (SEPSIS)
1000.0000 mL | Freq: Once | INTRAVENOUS | Status: AC
Start: 2014-10-15 — End: 2014-10-15
  Administered 2014-10-15: 1000 mL via INTRAVENOUS

## 2014-10-15 MED ORDER — RITONAVIR 100 MG PO TABS
100.0000 mg | ORAL_TABLET | Freq: Every day | ORAL | Status: DC
  Administered 2014-10-16 – 2014-10-21 (×6): 100 mg via ORAL
  Filled 2014-10-15 (×10): qty 1

## 2014-10-15 MED ORDER — DARUNAVIR ETHANOLATE 800 MG PO TABS
800.0000 mg | ORAL_TABLET | Freq: Every day | ORAL | Status: DC
  Administered 2014-10-16 – 2014-10-21 (×6): 800 mg via ORAL
  Filled 2014-10-15 (×10): qty 1

## 2014-10-15 MED ORDER — MAGNESIUM SULFATE 4 GM/100ML IV SOLN
INTRAVENOUS | Status: AC
  Filled 2014-10-15: qty 100

## 2014-10-15 MED ORDER — VERAPAMIL HCL ER 240 MG PO TBCR
240.0000 mg | EXTENDED_RELEASE_TABLET | Freq: Every day | ORAL | Status: DC
  Administered 2014-10-15 – 2014-10-17 (×3): 240 mg via ORAL
  Filled 2014-10-15 (×3): qty 1

## 2014-10-15 MED ORDER — DILTIAZEM HCL 25 MG/5ML IV SOLN
10.0000 mg | Freq: Once | INTRAVENOUS | Status: AC
  Administered 2014-10-15: 10 mg via INTRAVENOUS

## 2014-10-15 MED ORDER — MAGNESIUM SULFATE 4 GM/100ML IV SOLN
4.0000 g | Freq: Once | INTRAVENOUS | Status: AC
  Administered 2014-10-15: 4 g via INTRAVENOUS
  Filled 2014-10-15: qty 100

## 2014-10-15 MED ORDER — ACETAMINOPHEN 325 MG PO TABS
650.0000 mg | ORAL_TABLET | ORAL | Status: DC | PRN
  Administered 2014-10-15 – 2014-10-19 (×6): 650 mg via ORAL
  Filled 2014-10-15 (×6): qty 2

## 2014-10-15 MED ORDER — PNEUMOCOCCAL VAC POLYVALENT 25 MCG/0.5ML IJ INJ
0.5000 mL | INJECTION | INTRAMUSCULAR | Status: AC
  Administered 2014-10-16: 0.5 mL via INTRAMUSCULAR
  Filled 2014-10-15: qty 0.5

## 2014-10-15 MED ORDER — BECLOMETHASONE DIPROPIONATE 80 MCG/ACT IN AERS: 2.0000 | INHALATION_SPRAY | Freq: Two times a day (BID) | RESPIRATORY_TRACT | Status: DC

## 2014-10-15 MED ORDER — RALTEGRAVIR POTASSIUM 400 MG PO TABS
400.0000 mg | ORAL_TABLET | Freq: Two times a day (BID) | ORAL | Status: DC
  Administered 2014-10-15 – 2014-10-21 (×13): 400 mg via ORAL
  Filled 2014-10-15 (×21): qty 1

## 2014-10-15 MED ORDER — METOPROLOL TARTRATE 1 MG/ML IV SOLN
INTRAVENOUS | Status: AC
  Filled 2014-10-15: qty 5

## 2014-10-15 MED ORDER — POTASSIUM CHLORIDE CRYS ER 20 MEQ PO TBCR
40.0000 meq | EXTENDED_RELEASE_TABLET | ORAL | Status: DC
  Administered 2014-10-15 (×3): 40 meq via ORAL
  Filled 2014-10-15: qty 4
  Filled 2014-10-15 (×2): qty 2

## 2014-10-15 MED ORDER — METOPROLOL TARTRATE 1 MG/ML IV SOLN
2.5000 mg | Freq: Once | INTRAVENOUS | Status: AC
  Administered 2014-10-15: 2.5 mg via INTRAVENOUS

## 2014-10-15 MED ORDER — FLUTICASONE PROPIONATE HFA 44 MCG/ACT IN AERO
2.0000 | INHALATION_SPRAY | Freq: Two times a day (BID) | RESPIRATORY_TRACT | Status: DC
  Administered 2014-10-15 – 2014-10-21 (×13): 2 via RESPIRATORY_TRACT
  Filled 2014-10-15: qty 10.6

## 2014-10-15 MED ORDER — INFLUENZA VAC SPLIT QUAD 0.5 ML IM SUSY
0.5000 mL | PREFILLED_SYRINGE | INTRAMUSCULAR | Status: AC
  Administered 2014-10-16: 0.5 mL via INTRAMUSCULAR
  Filled 2014-10-15: qty 0.5

## 2014-10-15 MED ORDER — LUBIPROSTONE 8 MCG PO CAPS
8.0000 ug | ORAL_CAPSULE | Freq: Two times a day (BID) | ORAL | Status: DC
  Administered 2014-10-15 – 2014-10-17 (×4): 8 ug via ORAL
  Filled 2014-10-15 (×6): qty 1

## 2014-10-15 MED ORDER — DILTIAZEM HCL 25 MG/5ML IV SOLN
INTRAVENOUS | Status: AC
  Filled 2014-10-15: qty 5

## 2014-10-15 NOTE — Progress Notes (Signed)
DR MEMON NOTIFED OF REPEATED INCREASE IN PT'S HR UP TO 190'S BRIEFLY. ORDERS RECEIVED.

## 2014-10-15 NOTE — Progress Notes (Signed)
[facility] PROGRESS NOTE  [name] ESP:[id number] DOB: 12-01-1960 DOA: 10/12/2014 PCP: Barbette Merino, MD  Assessment/Plan: 1. Acute renal failure. Creatinine on admission was 11.63. With placement of Foley catheter she had approximately 7.8 L of urine output within first 24 hours of admission. This would indicate a postobstructive uropathy. Renal ultrasound done does not show any evidence of hydronephrosis or other acute findings. Continue foley catheter for now . Creatinine has now improved to 0.49. Continue IV fluids. Appreciate nephrology input. 2. Urinary retention. I suspect this is related to medications. This case with Dr. Leonel Ramsay on call for neurology. Agreed that this is likely medication related, but felt it would be appropriate to MRI her spine in the next 24 hours once MRI services are available. He did not feel that MRI needed to be done emergently. Case also discussed with Dr. Gaynelle Arabian, on-call for urology and recommendations were to continue Foley catheter for another 24 hours with voiding trial to be done tomorrow 3. Sepsis. Patient was having fevers and developed a significant leukocytosis. Urine culture has shown no growth. Blood cultures have shown no growth yet. Lumbar puncture was also thought to be unremarkable. She is on broad-spectrum antibiotics. Chest x-ray indicates possible aspiration pneumonia. She did have a possible aspiration event and MRI. We'll continue broad-spectrum antibiotics for now..  4. Hyperkalemia. Related to #1. Resolved. 5. Acute encephalopathy. Possibly related to developing sepsis, versus residual encephalopathy from renal failure. Urine drug screen appropriately positive for benzos. ABG, ammonia unremarkable. MRI of the brain did not show any acute findings. Xanax and oxycodone that she was taking prior to admission have been discontinued. This appears to be gradually improving 6. HIV. Recent CD4 count from 06/2014 was 1330 indicating good  compliance. She is on antiretroviral therapy. We'll restart these once her mental status improves. 7. Hypertension. Patient takes clonidine, verapamil as an outpatient. Blood pressures are currently stable. We'll restart these medications as needed.  Code Status: full code Family Communication: no family present Disposition Plan: pending hospital course   Consultants:  Nephrology  Procedures:  Lumbar puncture on 12/24  Antibiotics:  Vancomycin 12/23>>  Zosyn 12/23>>  HPI/Subjective: Patient does not remember events that lead to her hospitalization. Denies nausea or vomiting.   Objective: Filed Vitals:   10/15/14 1152  BP:   Pulse:   Temp: 98.6 F (37 C)  Resp:     Intake/Output Summary (Last 24 hours) at 10/15/14 1220 Last data filed at 10/15/14 1155  Gross per 24 hour  Intake 3971.83 ml  Output   1175 ml  Net 2796.83 ml   Filed Weights   10/13/14 0500 10/14/14 0430 10/15/14 0500  Weight: 66.3 kg (146 lb 2.6 oz) 68.7 kg (151 lb 7.3 oz) 68.3 kg (150 lb 9.2 oz)    Exam:   General:  NAD, sitting up in bed, able to carry on a conversation  Cardiovascular: s1, s2, tachycardic  Respiratory: crackles on left side  Abdomen: soft, diffusely tender, nd, bs+  Musculoskeletal:  No edema b/l   Neuro: awake, alert, strength is 3-4/5 in LE bilaterally, DTR at patella elicited. Plantars are down going. Able to stand and lift feet while ambulating  Data Reviewed: Basic Metabolic Panel:  Recent Labs Lab 10/12/14 2250 10/13/14 0450 10/13/14 1123 10/14/14 0537 10/15/14 1000  NA 130* 137 137 139 138  K 4.2 4.4 3.9 3.7 2.8*  CL 100 104 101 108 106  CO2 15* 21 24 19 23   GLUCOSE 149* 129* 132* 122* 81  BUN 58* 47* 42* 34* 13  CREATININE 3.75* 2.18* 1.31* 1.11* 0.49*  CALCIUM 8.8 9.1 8.9 8.0* 7.4*   Liver Function Tests:  Recent Labs Lab 10/12/14 1459 10/13/14 0450  AST 59* 82*  ALT 21 25  ALKPHOS 135* 112  BILITOT 2.2* 1.7*  PROT 8.5* 7.7  ALBUMIN  2.6* 2.5*    Recent Labs Lab 10/12/14 1700  LIPASE 15    Recent Labs Lab 10/12/14 1517 10/13/14 1123  AMMONIA 31 38*   CBC:  Recent Labs Lab 10/12/14 1459 10/12/14 2045 10/13/14 0450 10/14/14 0537 10/15/14 1000  WBC 15.1* 13.2* 12.1* 21.7* 20.7*  NEUTROABS  --  10.4*  --   --   --   HGB 11.6* 10.8* 11.6* 9.8* 9.1*  HCT 33.6* 32.3* 34.5* 30.3* 27.3*  MCV 85.7 86.8 86.5 90.2 87.2  PLT 315 292 289 215 254   Cardiac Enzymes:  Recent Labs Lab 10/12/14 1700  TROPONINI 0.03   BNP (last 3 results) No results for input(s): PROBNP in the last 8760 hours. CBG: No results for input(s): GLUCAP in the last 168 hours.  Recent Results (from the past 240 hour(s))  Urine culture     Status: None   Collection Time: 10/12/14  2:50 PM  Result Value Ref Range Status   Specimen Description URINE, CATHETERIZED  Final   Special Requests NONE  Final   Culture  Setup Time   Final    10/13/2014 00:54 Performed at [facility] Performed at [facility]   Final   Culture NO GROWTH Performed at [facility]   Final   Report Status 10/13/2014 FINAL  Final  Culture, blood (routine x 2)     Status: None (Preliminary result)   Collection Time: 10/12/14  3:17 PM  Result Value Ref Range Status   Specimen Description BLOOD RIGHT HAND  Final   Special Requests BOTTLES DRAWN AEROBIC ONLY 5CC  Final   Culture NO GROWTH 2 DAYS  Final   Report Status PENDING  Incomplete  Culture, blood (routine x 2)     Status: None (Preliminary result)   Collection Time: 10/12/14  5:00 PM  Result Value Ref Range Status   Specimen Description BLOOD RIGHT HAND  Final   Special Requests BOTTLES DRAWN AEROBIC ONLY 5CC  Final   Culture NO GROWTH 2 DAYS  Final   Report Status PENDING  Incomplete  MRSA PCR Screening     Status: None   Collection Time: 10/12/14  8:00 PM  Result Value Ref Range Status   MRSA by PCR NEGATIVE NEGATIVE Final    Comment:         The GeneXpert MRSA Assay (FDA approved for NASAL specimens only), is one component of a comprehensive MRSA colonization surveillance program. It is not intended to diagnose MRSA infection nor to guide or monitor treatment for MRSA infections.   CSF culture     Status: None (Preliminary result)   Collection Time: 10/14/14 10:50 AM  Result Value Ref Range Status   Specimen Description CSF  Final   Special Requests NONE  Final   Gram Stain   Final    WBC PRESENT, PREDOMINANTLY MONONUCLEAR NO ORGANISMS SEEN CYTOSPIN Performed at [facility] Performed at [facility]   Final    NO GROWTH 1 DAY Performed at [facility]    Report Status PENDING  Incomplete     Studies: Dg  Chest 1 View  10/13/2014   CLINICAL DATA:  Vomiting, cough, wheezing  EXAM: CHEST - 1 VIEW  COMPARISON:  10/12/2014  FINDINGS: There is patchy left interstitial and alveolar airspace opacities. There is mild right lung interstitial thickening. There is no pleural effusion or pneumothorax. Stable cardiomediastinal silhouette. Right shoulder arthroplasty. 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  IMPRESSION: Patchy interstitial and alveolar left lung opacities and mild right lung interstitial disease. Overall appearance may reflect atypical infection given the patient's medical history versus aspiration versus atypical pulmonary edema.   Electronically Signed   By: Kathreen Devoid   On: 10/13/2014 19:44   Mr Brain Wo Contrast  10/13/2014   CLINICAL DATA:  53 year old female with HIV, altered mental status. Agitated. Initial encounter.  EXAM: MRI HEAD WITHOUT CONTRAST  TECHNIQUE: Multiplanar, multiecho pulse sequences of the brain and surrounding structures were obtained without intravenous contrast.  COMPARISON:  Head CT without contrast 10/12/2014. Brain MRI 08/16/2011.  FINDINGS: Study is intermittently degraded by motion artifact despite repeated imaging attempts.  Stable cerebral  volume since 2012. Major intracranial vascular flow voids are grossly stable. No restricted diffusion to suggest acute infarction. No midline shift, mass effect, evidence of mass lesion, ventriculomegaly, extra-axial collection or acute intracranial hemorrhage. Cervicomedullary junction and pituitary are within normal limits. Allowing for motion, gray and white matter signal throughout the brain appears stable and within normal limits for age. No contrast administered.  There is fluid or mucosal thickening in the sphenoid sinuses which is new. Other Visualized paranasal sinuses and mastoids are clear. Grossly negative orbit and scalp soft tissues. Grossly normal bone marrow signal.  IMPRESSION: Degraded by motion despite repeated imaging attempts, but this noncontrast MRI brain appears stable compared to 2012 and within normal limits for age.   Electronically Signed   By: Lars Pinks M.D.   On: 10/13/2014 13:30   US Renal  10/13/2014   CLINICAL DATA:  Patient HIV positive. Renal failure. Urinary frequency and incontinence. History of uterine carcinoma.  EXAM: RENAL/URINARY TRACT ULTRASOUND COMPLETE  COMPARISON:  CT, 05/29/2013  FINDINGS: Right Kidney:  Length: 13.1 cm. Echogenicity within normal limits. No mass or hydronephrosis visualized.  Left Kidney:  Length: 12.0 cm. Echogenicity within normal limits. No mass or hydronephrosis visualized.  Bladder:  Nondistended and not well evaluated.  IMPRESSION: Normal renal ultrasound.  No hydronephrosis.   Electronically Signed   By: Lajean Manes M.D.   On: 10/13/2014 13:29   Dg Fluoro Guide Lumbar Puncture  10/14/2014   CLINICAL DATA:  Altered mental status with fever, encephalopathy and underlying HIV infection.  EXAM: DIAGNOSTIC LUMBAR PUNCTURE UNDER FLUOROSCOPIC GUIDANCE  FLUOROSCOPY TIME:  24 seconds  PROCEDURE: Time out procedure was performed. Because of mental status changes, informed consent from the patient cannot be obtained. The patient's niece provided  written consent. In addition, Dr. Roderic Palau requested the procedure based on medical necessity.  With the patient prone, the lower back was prepped with Betadine. 1% Lidocaine was used for local anesthesia. Lumbar puncture was performed at the left L2-3 level using a 20 gauge needle with return of clear, colorless CSF with an opening pressure of 9 cm water. 33ml of CSF were obtained for laboratory studies. The patient tolerated the procedure well and there were no apparent complications.  IMPRESSION: Diagnostic lumbar puncture performed without immediate complications.   Electronically Signed   By: Camie Patience M.D.   On: 10/14/2014 11:49    Scheduled Meds: . doripenem (DORIBAX) IV  500  mg Intravenous 3 times per day  . [START ON 10/16/2014] Influenza vac split quadrivalent PF  0.5 mL Intramuscular Tomorrow-1000  . [START ON 10/16/2014] pneumococcal 23 valent vaccine  0.5 mL Intramuscular Tomorrow-1000  . sodium chloride  3 mL Intravenous Q12H  . vancomycin  750 mg Intravenous Q12H   Continuous Infusions: . dextrose 5 % and 0.45% NaCl 100 mL/hr at 10/15/14 1100    Active Problems:   Human immunodeficiency virus (HIV) disease   Essential hypertension   Bipolar 1 disorder   Renal failure   Acute renal failure   Acute encephalopathy   UTI (urinary tract infection)    Time spent: 72mins    Hezakiah Champeau  [facility] Pager [phone]. If 7PM-7AM, please contact night-coverage at [email or web address], password [facility] 10/15/2014, 12:20 PM  LOS: 3 days

## 2014-10-15 NOTE — Progress Notes (Signed)
PT'S HEART RATE HAS DECREASED DOWN  TO 90'S TO 100'S. PT NOW EXTREMELT FATIGUED.

## 2014-10-15 NOTE — Plan of Care (Signed)
Problem: Phase I Progression Outcomes Goal: Hemodynamically stable Outcome: Not Progressing HEART RATE UP TO 190'S

## 2014-10-15 NOTE — Progress Notes (Addendum)
Pt's hr is back up to 190s. Dr Roderic Palau contacted and orders received.

## 2014-10-15 NOTE — Progress Notes (Signed)
DR MEMON NOTIFIED OF INCREASED HR UP TO 190'S. ORDER RECEIVED FOR 1 TIME DOSE OF DILTIAZEM 10MG  IV. THIS IS VERY EFFECTIVE.

## 2014-10-15 NOTE — Plan of Care (Signed)
Problem: Phase II Progression Outcomes Goal: Tolerating diet Outcome: Not Progressing DID NOT TOLERATE REGULAR DIET. TRANSITIONED BACK TO FULL LIQUIDS.

## 2014-10-16 ENCOUNTER — Inpatient Hospital Stay (HOSPITAL_COMMUNITY): Payer: Medicare Other

## 2014-10-16 DIAGNOSIS — A419 Sepsis, unspecified organism: Secondary | ICD-10-CM | POA: Diagnosis not present

## 2014-10-16 LAB — BASIC METABOLIC PANEL
ANION GAP: 5 (ref 5–15)
BUN: 5 mg/dL — ABNORMAL LOW (ref 6–23)
CO2: 22 mmol/L (ref 19–32)
Calcium: 7 mg/dL — ABNORMAL LOW (ref 8.4–10.5)
Chloride: 111 mEq/L (ref 96–112)
Creatinine, Ser: 0.48 mg/dL — ABNORMAL LOW (ref 0.50–1.10)
Glucose, Bld: 126 mg/dL — ABNORMAL HIGH (ref 70–99)
Potassium: 2.5 mmol/L — CL (ref 3.5–5.1)
SODIUM: 138 mmol/L (ref 135–145)

## 2014-10-16 LAB — HERPES SIMPLEX VIRUS(HSV) DNA BY PCR
HSV 1 DNA: NOT DETECTED
HSV 2 DNA: NOT DETECTED

## 2014-10-16 LAB — CBC
HCT: 25.3 % — ABNORMAL LOW (ref 36.0–46.0)
HEMOGLOBIN: 8.3 g/dL — AB (ref 12.0–15.0)
MCH: 28.9 pg (ref 26.0–34.0)
MCHC: 32.8 g/dL (ref 30.0–36.0)
MCV: 88.2 fL (ref 78.0–100.0)
PLATELETS: 237 10*3/uL (ref 150–400)
RBC: 2.87 MIL/uL — ABNORMAL LOW (ref 3.87–5.11)
RDW: 14.1 % (ref 11.5–15.5)
WBC: 16.1 10*3/uL — AB (ref 4.0–10.5)

## 2014-10-16 LAB — POTASSIUM: POTASSIUM: 3.2 mmol/L — AB (ref 3.5–5.1)

## 2014-10-16 LAB — MAGNESIUM: MAGNESIUM: 1.4 mg/dL — AB (ref 1.5–2.5)

## 2014-10-16 MED ORDER — POTASSIUM CHLORIDE CRYS ER 20 MEQ PO TBCR
40.0000 meq | EXTENDED_RELEASE_TABLET | ORAL | Status: AC
  Administered 2014-10-16 (×3): 40 meq via ORAL
  Filled 2014-10-16 (×3): qty 2

## 2014-10-16 MED ORDER — ALUM & MAG HYDROXIDE-SIMETH 200-200-20 MG/5ML PO SUSP
30.0000 mL | Freq: Once | ORAL | Status: AC
  Administered 2014-10-17: 30 mL via ORAL
  Filled 2014-10-16: qty 30

## 2014-10-16 MED ORDER — POTASSIUM CHLORIDE IN NACL 40-0.9 MEQ/L-% IV SOLN
INTRAVENOUS | Status: DC
Start: 2014-10-16 — End: 2014-10-17
  Administered 2014-10-16 – 2014-10-17 (×3): 100 mL/h via INTRAVENOUS

## 2014-10-16 MED ORDER — SODIUM CHLORIDE 0.9 % IV SOLN: INTRAVENOUS | Status: DC

## 2014-10-16 MED ORDER — POTASSIUM CHLORIDE 10 MEQ/100ML IV SOLN
10.0000 meq | INTRAVENOUS | Status: AC
  Administered 2014-10-16 (×3): 10 meq via INTRAVENOUS
  Filled 2014-10-16 (×3): qty 100

## 2014-10-16 MED ORDER — POTASSIUM CHLORIDE CRYS ER 20 MEQ PO TBCR
40.0000 meq | EXTENDED_RELEASE_TABLET | Freq: Once | ORAL | Status: AC
  Administered 2014-10-16: 40 meq via ORAL
  Filled 2014-10-16: qty 4

## 2014-10-16 MED ORDER — MAGNESIUM SULFATE 4 GM/100ML IV SOLN
4.0000 g | Freq: Once | INTRAVENOUS | Status: AC
  Administered 2014-10-16: 4 g via INTRAVENOUS
  Filled 2014-10-16: qty 100

## 2014-10-16 MED ORDER — LORAZEPAM 2 MG/ML IJ SOLN
2.0000 mg | Freq: Once | INTRAMUSCULAR | Status: AC
Start: 2014-10-16 — End: 2014-10-16
  Administered 2014-10-16: 2 mg via INTRAVENOUS
  Filled 2014-10-16: qty 1

## 2014-10-16 NOTE — Progress Notes (Signed)
Patient unable to void, since foley removed at 1130. Bladder scanner showed 619 cc. Dr Roderic Palau notified by phone, and order for foley given.

## 2014-10-16 NOTE — Progress Notes (Signed)
CRITICAL VALUE ALERT  Critical value received:  K+ 2.5  Date of notification:  10/16/2014  Time of notification:  0635  Critical value read back:Yes.    Nurse who received alert:  Clarene Essex, RN  MD notified (1st page):  Hal Hope, MD  Time of first page:  [phone]  MD notified (2nd page):  Time of second page:  Responding MD: Hal Hope, MD  Time MD responded:  [phone]  Orders received on the chart for Mg+ level check and K+ replacements

## 2014-10-16 NOTE — Progress Notes (Addendum)
[facility] PROGRESS NOTE  [name] YQI:[id number] DOB: February 07, 1961 DOA: 10/12/2014 PCP: Barbette Merino, MD  Summary:  This is a 53 y/o female who presented to the hospital with lethargy, nausea and vomiting. Blood work in ED revealed acute renal failure with a creatinine above 11. Foley catheter was placed in ED with significant urine output and improvement in her renal failure. 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 MRI of the T-L spine were unrevealing. She will need outpatient follow up with urology and will likely need to be discharged with foley catheter in place. She did have a possible aspiration event, leading to sepsis and pneumonia. She is on appropriate antibiotics at this time. Her lethargy has improved and was also felt to be related to medications/sepsis. MRI of the brain and lumbar puncture were unremarkable. She is slowly improving and will likely be in the hospital a few more days.    Assessment/Plan: 1. Acute renal failure. Creatinine on admission was 11.63. With placement of Foley catheter she had approximately 7.8 L of urine output within first 24 hours of admission. This would indicate a postobstructive uropathy. Renal ultrasound done did not show any evidence of hydronephrosis or other acute findings. Continue foley catheter for now . Creatinine has now improved to 0.48. Continue IV fluids. Appreciate nephrology input. 2. Urinary retention. I suspect this is related to medications. Case discussed with Dr. Leonel Ramsay on call for neurology. Agreed that this is likely medication related, but felt it would be appropriate to MRI her spine. Will order MRI L and T spine. Case also discussed with Dr. Gaynelle Arabian, on-call for urology. Will attempt voiding trial today. 3. Sepsis. Patient was having fevers and developed a significant leukocytosis. Urine culture has shown no growth.  Blood cultures have shown no growth yet. Lumbar puncture was also thought to be unremarkable. She is on broad-spectrum antibiotics. Chest x-ray indicates possible aspiration pneumonia. She did have a possible aspiration event and MRI. We'll continue broad-spectrum antibiotics for now. Leukocytosis trending down and she has been afebrile. Consider transition to po antibiotics in next 24 hours if continues to improve.  4. Hyperkalemia. Related to #1. Resolved. 5. Hypokalemia/hypomagnesemia. Likely related to diuresis from recovering renal failure. Replace. 6. Acute encephalopathy. Possibly related to developing sepsis, versus residual encephalopathy from renal failure. Urine drug screen appropriately positive for benzos. ABG, ammonia unremarkable. MRI of the brain did not show any acute findings. Xanax and oxycodone that she was taking prior to admission have been discontinued. This appears to be gradually improving 7. HIV. Recent CD4 count from 06/2014 was 1330 indicating good compliance. She is on antiretroviral therapy.  8. Hypertension. Patient takes clonidine, verapamil as an outpatient. Blood pressures are currently stable. We'll restart these medications as needed.  Code Status: full code Family Communication: no family present Disposition Plan: pending hospital course   Consultants:  Nephrology  Procedures:  Lumbar puncture on 12/24  Antibiotics:  Vancomycin 12/23>>  Zosyn 12/23>>  HPI/Subjective: Denies any nausea, vomiting. No diarrhea. No shortness of breath  Objective: Filed Vitals:   10/16/14 0900  BP: 91/63  Pulse: 109  Temp:   Resp: 15    Intake/Output Summary (Last 24 hours) at 10/16/14 1008 Last data filed at 10/16/14 0600  Gross per 24 hour  Intake   5013 ml  Output    900 ml  Net   4113 ml   Filed Weights   10/14/14 0430 10/15/14  0500 10/16/14 0500  Weight: 68.7 kg (151 lb 7.3 oz) 68.3 kg (150 lb 9.2 oz) 72.9 kg (160 lb 11.5 oz)    Exam:   General:   NAD  Cardiovascular: s1, s2, tachycardic  Respiratory: CTA B  Abdomen: soft, diffusely tender, nd, bs+  Musculoskeletal:  No edema b/l    Data Reviewed: Basic Metabolic Panel:  Recent Labs Lab 10/13/14 0450 10/13/14 1123 10/14/14 0537 10/15/14 1000 10/16/14 0556 10/16/14 0650  NA 137 137 139 138 138  --   K 4.4 3.9 3.7 2.8* 2.5*  --   CL 104 101 108 106 111  --   CO2 21 24 19 23 22   --   GLUCOSE 129* 132* 122* 81 126*  --   BUN 47* 42* 34* 13 5*  --   CREATININE 2.18* 1.31* 1.11* 0.49* 0.48*  --   CALCIUM 9.1 8.9 8.0* 7.4* 7.0*  --   MG  --   --   --  0.9*  --  1.4*   Liver Function Tests:  Recent Labs Lab 10/12/14 1459 10/13/14 0450  AST 59* 82*  ALT 21 25  ALKPHOS 135* 112  BILITOT 2.2* 1.7*  PROT 8.5* 7.7  ALBUMIN 2.6* 2.5*    Recent Labs Lab 10/12/14 1700  LIPASE 15    Recent Labs Lab 10/12/14 1517 10/13/14 1123  AMMONIA 31 38*   CBC:  Recent Labs Lab 10/12/14 2045 10/13/14 0450 10/14/14 0537 10/15/14 1000 10/16/14 0556  WBC 13.2* 12.1* 21.7* 20.7* 16.1*  NEUTROABS 10.4*  --   --   --   --   HGB 10.8* 11.6* 9.8* 9.1* 8.3*  HCT 32.3* 34.5* 30.3* 27.3* 25.3*  MCV 86.8 86.5 90.2 87.2 88.2  PLT 292 289 215 254 237   Cardiac Enzymes:  Recent Labs Lab 10/12/14 1700  TROPONINI 0.03   BNP (last 3 results) No results for input(s): PROBNP in the last 8760 hours. CBG: No results for input(s): GLUCAP in the last 168 hours.  Recent Results (from the past 240 hour(s))  Urine culture     Status: None   Collection Time: 10/12/14  2:50 PM  Result Value Ref Range Status   Specimen Description URINE, CATHETERIZED  Final   Special Requests NONE  Final   Culture  Setup Time   Final    10/13/2014 00:54 Performed at [place] Performed at [facility]   Final   Culture NO GROWTH Performed at [facility]   Final   Report Status 10/13/2014 FINAL  Final  Culture, blood (routine x 2)      Status: None (Preliminary result)   Collection Time: 10/12/14  3:17 PM  Result Value Ref Range Status   Specimen Description BLOOD RIGHT HAND  Final   Special Requests BOTTLES DRAWN AEROBIC ONLY 5CC  Final   Culture NO GROWTH 4 DAYS  Final   Report Status PENDING  Incomplete  Culture, blood (routine x 2)     Status: None (Preliminary result)   Collection Time: 10/12/14  5:00 PM  Result Value Ref Range Status   Specimen Description BLOOD RIGHT HAND  Final   Special Requests BOTTLES DRAWN AEROBIC ONLY 5CC  Final   Culture NO GROWTH 4 DAYS  Final   Report Status PENDING  Incomplete  MRSA PCR Screening     Status: None   Collection Time: 10/12/14  8:00 PM  Result Value Ref Range Status  MRSA by PCR NEGATIVE NEGATIVE Final    Comment:        The GeneXpert MRSA Assay (FDA approved for NASAL specimens only), is one component of a comprehensive MRSA colonization surveillance program. It is not intended to diagnose MRSA infection nor to guide or monitor treatment for MRSA infections.   CSF culture     Status: None (Preliminary result)   Collection Time: 10/14/14 10:50 AM  Result Value Ref Range Status   Specimen Description CSF  Final   Special Requests NONE  Final   Gram Stain   Final    WBC PRESENT, PREDOMINANTLY MONONUCLEAR NO ORGANISMS SEEN CYTOSPIN Performed at [facility] Performed at [place]   Final    NO GROWTH 1 DAY Performed at [facility]    Report Status PENDING  Incomplete     Studies: Dg Fluoro Guide Lumbar Puncture  10/14/2014   CLINICAL DATA:  Altered mental status with fever, encephalopathy and underlying HIV infection.  EXAM: DIAGNOSTIC LUMBAR PUNCTURE UNDER FLUOROSCOPIC GUIDANCE  FLUOROSCOPY TIME:  24 seconds  PROCEDURE: Time out procedure was performed. Because of mental status changes, informed consent from the patient cannot be obtained. The patient's niece provided written consent. In addition, Dr. Roderic Palau  requested the procedure based on medical necessity.  With the patient prone, the lower back was prepped with Betadine. 1% Lidocaine was used for local anesthesia. Lumbar puncture was performed at the left L2-3 level using a 20 gauge needle with return of clear, colorless CSF with an opening pressure of 9 cm water. 70ml of CSF were obtained for laboratory studies. The patient tolerated the procedure well and there were no apparent complications.  IMPRESSION: Diagnostic lumbar puncture performed without immediate complications.   Electronically Signed   By: Camie Patience M.D.   On: 10/14/2014 11:49    Scheduled Meds: . Darunavir Ethanolate  800 mg Oral Q breakfast  . doripenem (DORIBAX) IV  500 mg Intravenous 3 times per day  . emtricitabine-tenofovir  1 tablet Oral Daily  . fluticasone  2 puff Inhalation BID  . lubiprostone  8 mcg Oral BID WC  . magnesium sulfate 1 - 4 g bolus IVPB  4 g Intravenous Once  . potassium chloride  10 mEq Intravenous Q1 Hr x 3  . potassium chloride  40 mEq Oral Q3H  . raltegravir  400 mg Oral BID  . ritonavir  100 mg Oral Q breakfast  . sodium chloride  3 mL Intravenous Q12H  . vancomycin  750 mg Intravenous Q12H  . verapamil  240 mg Oral Daily   Continuous Infusions: . 0.9 % NaCl with KCl 40 mEq / L 100 mL/hr (10/16/14 0947)    Active Problems:   Human immunodeficiency virus (HIV) disease   Essential hypertension   Bipolar 1 disorder   Renal failure   Acute renal failure   Acute encephalopathy   Sepsis   Aspiration pneumonia    Time spent: 74mins    Marlei Glomski  [facility] Pager [phone]. If 7PM-7AM, please contact night-coverage at [email or web address], password [facility] 10/16/2014, 10:08 AM  LOS: 4 days

## 2014-10-17 ENCOUNTER — Inpatient Hospital Stay (HOSPITAL_COMMUNITY): Payer: Medicare Other

## 2014-10-17 LAB — CSF CULTURE

## 2014-10-17 LAB — CULTURE, BLOOD (ROUTINE X 2)
Culture: NO GROWTH
Culture: NO GROWTH

## 2014-10-17 LAB — CLOSTRIDIUM DIFFICILE BY PCR: Toxigenic C. Difficile by PCR: POSITIVE — AB

## 2014-10-17 LAB — CSF CULTURE W GRAM STAIN: Culture: NO GROWTH

## 2014-10-17 LAB — OCCULT BLOOD X 1 CARD TO LAB, STOOL: FECAL OCCULT BLD: NEGATIVE

## 2014-10-17 MED ORDER — CLINDAMYCIN HCL 150 MG PO CAPS
300.0000 mg | ORAL_CAPSULE | Freq: Three times a day (TID) | ORAL | Status: DC
  Filled 2014-10-17 (×4): qty 1

## 2014-10-17 MED ORDER — FLUCONAZOLE 100 MG PO TABS
100.0000 mg | ORAL_TABLET | Freq: Every day | ORAL | Status: DC
  Administered 2014-10-17 – 2014-10-20 (×4): 100 mg via ORAL
  Filled 2014-10-17 (×4): qty 1

## 2014-10-17 MED ORDER — PROMETHAZINE HCL 25 MG/ML IJ SOLN
12.5000 mg | Freq: Four times a day (QID) | INTRAMUSCULAR | Status: DC | PRN
Start: 2014-10-17 — End: 2014-10-21
  Administered 2014-10-17 – 2014-10-20 (×6): 12.5 mg via INTRAVENOUS
  Filled 2014-10-17 (×7): qty 1

## 2014-10-17 MED ORDER — VANCOMYCIN 50 MG/ML ORAL SOLUTION
125.0000 mg | Freq: Four times a day (QID) | ORAL | Status: DC
  Administered 2014-10-17 – 2014-10-21 (×16): 125 mg via ORAL
  Filled 2014-10-17 (×32): qty 2.5

## 2014-10-17 MED ORDER — SODIUM CHLORIDE 0.9 % IV SOLN
INTRAVENOUS | Status: DC
  Administered 2014-10-17 – 2014-10-20 (×4): via INTRAVENOUS

## 2014-10-17 NOTE — Progress Notes (Signed)
CRITICAL VALUE ALERT  Critical value received:  C-Diff + Date of notification:  10/17/2014  Time of notification:  2595  Critical value read back:Yes.    Nurse who received alert:  N.Burnell Hurta,RN  MD notified (1st page):  Samtani  Time of first page:  1735  MD notified (2nd page):  Time of second page:  Responding MD:    Time MD responded:

## 2014-10-17 NOTE — Progress Notes (Signed)
Pt has had 2 large loose foul smelling stools with persistent nausea, vomited again this evening during the shift. C-diff PCR collected. Awaiting for results.

## 2014-10-17 NOTE — Progress Notes (Signed)
ANTIBIOTIC CONSULT NOTE - INITIAL  Pharmacy Consult for Diflucan Indication: candida esophagitis  Allergies  Allergen Reactions  . Aspirin Nausea And Vomiting  . Sulfonamide Derivatives Hives    Patient Measurements: Height: 5\' 5"  (165.1 cm) Weight: 156 lb 8.4 oz (71 kg) IBW/kg (Calculated) : 57  Vital Signs: Temp: 99.6 F (37.6 C) (12/27 1100) Temp Source: Oral (12/27 1100) BP: 122/103 mmHg (12/27 1100) Pulse Rate: 112 (12/27 1100) Intake/Output from previous day: 12/26 0701 - 12/27 0700 In: 384 [P.O.:384] Out: 2300 [Urine:2300] Intake/Output from this shift:    Labs:  Recent Labs  10/15/14 1000 10/16/14 0556  WBC 20.7* 16.1*  HGB 9.1* 8.3*  PLT 254 237  CREATININE 0.49* 0.48*   Estimated Creatinine Clearance: 82.2 mL/min (by C-G formula based on Cr of 0.48). No results for input(s): VANCOTROUGH, VANCOPEAK, VANCORANDOM, GENTTROUGH, GENTPEAK, GENTRANDOM, TOBRATROUGH, TOBRAPEAK, TOBRARND, AMIKACINPEAK, AMIKACINTROU, AMIKACIN in the last 72 hours.   Microbiology: Recent Results (from the past 720 hour(s))  Urine culture     Status: None   Collection Time: 10/12/14  2:50 PM  Result Value Ref Range Status   Specimen Description URINE, CATHETERIZED  Final   Special Requests NONE  Final   Culture  Setup Time   Final    10/13/2014 00:54 Performed at [place] Performed at [facility]   Final   Culture NO GROWTH Performed at [facility]   Final   Report Status 10/13/2014 FINAL  Final  Culture, blood (routine x 2)     Status: None   Collection Time: 10/12/14  3:17 PM  Result Value Ref Range Status   Specimen Description BLOOD RIGHT HAND  Final   Special Requests BOTTLES DRAWN AEROBIC ONLY 5CC  Final   Culture NO GROWTH 5 DAYS  Final   Report Status 10/17/2014 FINAL  Final  Culture, blood (routine x 2)     Status: None   Collection Time: 10/12/14  5:00 PM  Result Value Ref Range Status   Specimen  Description BLOOD RIGHT HAND  Final   Special Requests BOTTLES DRAWN AEROBIC ONLY 5CC  Final   Culture NO GROWTH 5 DAYS  Final   Report Status 10/17/2014 FINAL  Final  MRSA PCR Screening     Status: None   Collection Time: 10/12/14  8:00 PM  Result Value Ref Range Status   MRSA by PCR NEGATIVE NEGATIVE Final    Comment:        The GeneXpert MRSA Assay (FDA approved for NASAL specimens only), is one component of a comprehensive MRSA colonization surveillance program. It is not intended to diagnose MRSA infection nor to guide or monitor treatment for MRSA infections.   CSF culture     Status: None   Collection Time: 10/14/14 10:50 AM  Result Value Ref Range Status   Specimen Description CSF  Final   Special Requests NONE  Final   Gram Stain   Final    WBC PRESENT, PREDOMINANTLY MONONUCLEAR NO ORGANISMS SEEN CYTOSPIN Performed at [facility] Performed at [place]   Final    NO GROWTH 3 DAYS Performed at [facility]    Report Status 10/17/2014 FINAL  Final    Medical History: Past Medical History  Diagnosis Date  . Baker's cyst   . Pulmonary lesion 1/03    on RUL onn CT   . Hemorrhoids   . Zoster 2001  .  Family history of diabetes mellitus   . Tobacco user   . Other abnormal Papanicolaou smear of cervix and cervical HPV(795.09)   . Herpes zoster   . Hemorrhoids   . Baker's cyst   . Pancreatitis   . HIV disease     take Prezista,Truvada,Norvir,and Isentress daily  . Bipolar disorder     With hx of psychotic features.  . Articular cartilage disorder of knee   . Bursitis of shoulder   . Rotator cuff arthropathy   . Hypertension     takes Clonidine and Verapamil daily  . Emphysema   . Asthma     Albuterol resue inhaler and QVAR daily  . Shortness of breath     with exertion and stress  . Dizziness   . Joint pain   . Joint swelling   . Chronic back pain     buldging disc  . Bruises easily   . Gastric ulcer   .  GERD (gastroesophageal reflux disease)     takes Nexium daily  . Chronic constipation   . Urinary frequency   . Urinary incontinence   . Nocturia   . History of blood transfusion at age 85  . Early cataracts, bilateral   . Anxiety   . Depression     takes Xanax daily and Depakote bid  . History of shingles   . 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 Tobacco abuse 10/26/2012  . Chronic back pain   . Chronic knee pain   . Complication of anesthesia     AGGITATED  . Uterine cancer     "had some kind of cancer in my teens; had to have TAH to remove it"  . Headache(784.0)     "often here lately; weather changing"  . Pneumonia     "several times"  . Arthritis     "all over" (03/16/2014)  . Osteoarthritis of left knee 03/16/2014  . Alcohol withdrawal delirium 03/22/2014    Medications:  Prescriptions prior to admission  Medication Sig Dispense Refill Last Dose  . albuterol (PROVENTIL HFA;VENTOLIN HFA) 108 (90 BASE) MCG/ACT inhaler Inhale 2 puffs into the lungs every 4 (four) hours as needed for shortness of breath.    Past Month at Unknown time  . albuterol (PROVENTIL) (2.5 MG/3ML) 0.083% nebulizer solution Take 2.5 mg by nebulization every 6 (six) hours as needed for wheezing or shortness of breath.   Past Month at Unknown time  . ALPRAZolam (XANAX) 1 MG tablet Take one tablet by mouth twice daily as needed for anxiety (Patient taking differently: Take 1 mg by mouth 2 (two) times daily. Take one tablet by mouth twice daily as needed for anxiety) 60 tablet 5 Past Week at Unknown time  . emtricitabine-tenofovir (TRUVADA) 200-300 MG per tablet Take 1 tablet by mouth daily. 30 tablet 3 unknown  . esomeprazole (NEXIUM) 40 MG capsule Take 40 mg by mouth daily before breakfast.   Past Week at Unknown time  . lisinopril (PRINIVIL,ZESTRIL) 20 MG tablet Take 20 mg by mouth daily.   unknown  . lubiprostone (AMITIZA) 8 MCG capsule Take 1 capsule (8 mcg total) by  mouth 2 (two) times daily with a meal. 60 capsule 11 Past Week at Unknown time  . ondansetron (ZOFRAN) 4 MG tablet Take 4 mg by mouth every 8 (eight) hours as needed for nausea.   unknown  . oxybutynin (DITROPAN-XL) 10 MG 24 hr tablet Take 10 mg by mouth 2 (two) times daily.  Past Week at Unknown time  . oxyCODONE-acetaminophen (PERCOCET) 7.5-325 MG per tablet Take 1 tablet by mouth 4 (four) times daily as needed for pain (for 30 days).    Past Week at Unknown time  . risperiDONE (RISPERDAL) 1 MG tablet Take 1 mg by mouth 2 (two) times daily.   unknown  . traMADol (ULTRAM) 50 MG tablet Take 50-100 mg by mouth every 6 (six) hours as needed for moderate pain.   Past Month  . verapamil (CALAN-SR) 240 MG CR tablet Take 240 mg by mouth daily.   Past Week at Unknown time  . zolpidem (AMBIEN) 5 MG tablet Take one tablet by mouth at bedtime as needed for sleep (Patient taking differently: Take 5 mg by mouth at bedtime. Take one tablet by mouth at bedtime as needed for sleep) 30 tablet 0 Past Month at Unknown time  . cloNIDine (CATAPRES) 0.2 MG tablet Take 0.1 mg by mouth at bedtime.   unknown  . cyanocobalamin (,VITAMIN B-12,) 1000 MCG/ML injection Inject 1,000 mcg into the muscle every 30 (thirty) days.   unknown  . Darunavir Ethanolate (PREZISTA) 800 MG tablet Take 800 mg by mouth daily with breakfast.   unknown  . diazepam (VALIUM) 5 MG tablet Take 5 mg by mouth daily as needed for muscle spasms (10 day course starting on 08/16/2014).    unknown  . diphenoxylate-atropine (LOMOTIL) 2.5-0.025 MG per tablet Take 1 tablet by mouth 4 (four) times daily as needed for diarrhea or loose stools. 120 tablet 0 unknown  . methocarbamol (ROBAXIN) 750 MG tablet Take 1 tablet (750 mg total) by mouth every 6 (six) hours as needed for muscle spasms. 75 tablet 1 unknown  . potassium chloride (K-DUR) 10 MEQ tablet Take 10 mEq by mouth daily.   unknown  . promethazine (PHENERGAN) 25 MG tablet Take 25 mg by mouth every 6  (six) hours as needed for nausea.   unknown  . QVAR 80 MCG/ACT inhaler Inhale 2 puffs into the lungs 2 (two) times daily.   unknown  . raltegravir (ISENTRESS) 400 MG tablet Take 1 tablet (400 mg total) by mouth 2 (two) times daily. 60 tablet 5 unknown  . ritonavir (NORVIR) 100 MG TABS tablet Take 100 mg by mouth daily with breakfast.   unknown  . tiotropium (SPIRIVA) 18 MCG inhalation capsule Place 18 mcg into inhaler and inhale daily.   unknown   Assessment: 30 yoF who is seen by GI for chronic pancreatitis and recent work-up for nausea/vomiting including EGD.  GI recommended Diflucan for candida esophagitis.  She also has hx HIV on HARRT regimen which has drug interactions with Diflucan. Pharmacist at [facility] was previously consulted for recommended Diflucan dose. Hx acute renal failure, however Scr has returned to patient's baseline.   Diflucan 12/27>>  Goal of Therapy:  Eradicate infection.  Plan:  Diflucan 100mg  po daily x 14 days as previously discussed with ID clinic & GI Monitor renal function  [name], Lavonia Drafts 10/17/2014,12:12 PM

## 2014-10-17 NOTE — Progress Notes (Signed)
[facility] PROGRESS NOTE  Verena Shawgo CWC:[id number] DOB: [birth year] DOA: 10/12/2014 PCP: Barbette Merino, MD  Summary:  This is a 53 y/o female who presented to the hospital with lethargy, nausea and vomiting. Blood work in ED revealed acute renal failure with a creatinine above 11. Foley catheter was placed in ED with significant urine output and improvement in her renal failure. 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 MRI of the T-L spine were unrevealing. She will need outpatient follow up with urology and will likely need to be discharged with foley catheter in place. She did have a possible aspiration event, leading to sepsis and pneumonia. She is on appropriate antibiotics at this time. Her lethargy has improved and was also felt to be related to medications/sepsis. MRI of the brain and lumbar puncture were unremarkable. She is slowly improving but then proceeded to have significant n/v.  It was noted that she was Rx recently for ? Candida esophagitis by Dr. 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 Acute renal failure. Creatinine on admission was 11.63. With placement of Foley catheter she had approximately 7.8 L of urine output within first 24 hours of admission. This would indicate a postobstructive uropathy. Renal ultrasound done did not show any evidence of hydronephrosis or other acute findings. Continue foley catheter for now . Creatinine has now improved to 0.48. Continue IV fluids. Appreciate nephrology input. 2. 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 Urinary retention. I suspect this is  related to medications. Case discussed with Dr. Leonel Ramsay on call for neurology. Agreed that this is likely medication related, but felt it would be appropriate to MRI her spine. Will order MRI L and T spine. Case also discussed with Dr. Gaynelle Arabian, on-call for urology. Failed voiding trial 12/26 so might need long term foley. 4. Sepsis. Patient was having fevers and developed a significant leukocytosis. Urine culture has shown no growth. Blood cultures have shown no growth yet. Lumbar puncture was also thought to be unremarkable. She is on broad-spectrum antibiotics. Chest x-ray indicates possible aspiration pneumonia. She did have a possible aspiration event and MRI. We'll continue broad-spectrum antibiotics for now. Leukocytosis trending down and she has been afebrile. IV abx d/c 12/27-received 5 days total Vanc and Doripenem 5. Hyperkalemia. Related to #1. Resolved. 6. Hypokalemia/hypomagnesemia. Likely related to diuresis from recovering renal failure. Replace. 7. Acute encephalopathy. Possibly related to developing sepsis, versus residual encephalopathy from renal failure. Urine drug screen appropriately positive for benzos. ABG, ammonia unremarkable. MRI of the brain did not show any acute findings. Xanax and oxycodone that she was taking prior to admission have been discontinued. This appears to be gradually improving 8. HIV. Recent CD4 count from 06/2014 was 1330 indicating good compliance. She is on antiretroviral therapy.  9. Hypertension. Patient takes clonidine, verapamil as an outpatient. Blood pressures are currently stable. We'll restart these medications as needed.  Code Status: full code Family Communication: no family present Disposition Plan: pending hospital course   Consultants:  Nephrology  Procedures:  Lumbar puncture on 12/24  Antibiotics:  Vancomycin 12/23>>12/27  Zosyn/Doripenem 12/23>>12/27  Fluconazole 12/27  HPI/Subjective:   2 episodes vomiting earlier  today Dark, not red, stomach contents Patient confused, but more oriented than  prior to RN States abd discomfort.  No reported diarrhea  Objective: Filed Vitals:   10/17/14 0900  BP: 129/92  Pulse: 117  Temp:   Resp: 30    Intake/Output Summary (Last 24 hours) at 10/17/14 1021 Last data filed at 10/17/14 0500  Gross per 24 hour  Intake    264 ml  Output   2300 ml  Net  -2036 ml   Filed Weights   10/15/14 0500 10/16/14 0500 10/17/14 0500  Weight: 68.3 kg (150 lb 9.2 oz) 72.9 kg (160 lb 11.5 oz) 71 kg (156 lb 8.4 oz)    Exam:   General:  NAD  Cardiovascular: s1, s2, tachycardic  Respiratory: CTA B  Abdomen: soft, diffusely tender, nd, bs+  Musculoskeletal:  No edema b/l    Data Reviewed: Basic Metabolic Panel:  Recent Labs Lab 10/13/14 0450 10/13/14 1123 10/14/14 0537 10/15/14 1000 10/16/14 0556 10/16/14 0650 10/16/14 1612  NA 137 137 139 138 138  --   --   K 4.4 3.9 3.7 2.8* 2.5*  --  3.2*  CL 104 101 108 106 111  --   --   CO2 21 24 19 23 22   --   --   GLUCOSE 129* 132* 122* 81 126*  --   --   BUN 47* 42* 34* 13 5*  --   --   CREATININE 2.18* 1.31* 1.11* 0.49* 0.48*  --   --   CALCIUM 9.1 8.9 8.0* 7.4* 7.0*  --   --   MG  --   --   --  0.9*  --  1.4*  --    Liver Function Tests:  Recent Labs Lab 10/12/14 1459 10/13/14 0450  AST 59* 82*  ALT 21 25  ALKPHOS 135* 112  BILITOT 2.2* 1.7*  PROT 8.5* 7.7  ALBUMIN 2.6* 2.5*    Recent Labs Lab 10/12/14 1700  LIPASE 15    Recent Labs Lab 10/12/14 1517 10/13/14 1123  AMMONIA 31 38*   CBC:  Recent Labs Lab 10/12/14 2045 10/13/14 0450 10/14/14 0537 10/15/14 1000 10/16/14 0556  WBC 13.2* 12.1* 21.7* 20.7* 16.1*  NEUTROABS 10.4*  --   --   --   --   HGB 10.8* 11.6* 9.8* 9.1* 8.3*  HCT 32.3* 34.5* 30.3* 27.3* 25.3*  MCV 86.8 86.5 90.2 87.2 88.2  PLT 292 289 215 254 237   Cardiac Enzymes:  Recent Labs Lab 10/12/14 1700  TROPONINI 0.03   BNP (last 3 results) No results for  input(s): PROBNP in the last 8760 hours. CBG: No results for input(s): GLUCAP in the last 168 hours.  Recent Results (from the past 240 hour(s))  Urine culture     Status: None   Collection Time: 10/12/14  2:50 PM  Result Value Ref Range Status   Specimen Description URINE, CATHETERIZED  Final   Special Requests NONE  Final   Culture  Setup Time   Final    10/13/2014 00:54 Performed at [place] Performed at [facility]   Final   Culture NO GROWTH Performed at [facility]   Final   Report Status 10/13/2014 FINAL  Final  Culture, blood (routine x 2)     Status: None   Collection Time: 10/12/14  3:17 PM  Result Value Ref Range Status   Specimen Description BLOOD RIGHT HAND  Final   Special Requests BOTTLES DRAWN AEROBIC ONLY 5CC  Final   Culture  NO GROWTH 5 DAYS  Final   Report Status 10/17/2014 FINAL  Final  Culture, blood (routine x 2)     Status: None   Collection Time: 10/12/14  5:00 PM  Result Value Ref Range Status   Specimen Description BLOOD RIGHT HAND  Final   Special Requests BOTTLES DRAWN AEROBIC ONLY 5CC  Final   Culture NO GROWTH 5 DAYS  Final   Report Status 10/17/2014 FINAL  Final  MRSA PCR Screening     Status: None   Collection Time: 10/12/14  8:00 PM  Result Value Ref Range Status   MRSA by PCR NEGATIVE NEGATIVE Final    Comment:        The GeneXpert MRSA Assay (FDA approved for NASAL specimens only), is one component of a comprehensive MRSA colonization surveillance program. It is not intended to diagnose MRSA infection nor to guide or monitor treatment for MRSA infections.   CSF culture     Status: None (Preliminary result)   Collection Time: 10/14/14 10:50 AM  Result Value Ref Range Status   Specimen Description CSF  Final   Special Requests NONE  Final   Gram Stain   Final    WBC PRESENT, PREDOMINANTLY MONONUCLEAR NO ORGANISMS SEEN CYTOSPIN Performed at [facility] Performed  at [place]   Final    NO GROWTH 2 DAYS Performed at [facility]    Report Status PENDING  Incomplete     Studies: Mr Thoracic Spine Wo Contrast  10/16/2014   CLINICAL DATA:  Bilateral lower extremity weakness. Altered mental status.  EXAM: MRI THORACIC SPINE WITHOUT CONTRAST  TECHNIQUE: Multiplanar, multisequence MR imaging of the thoracic spine was performed. No intravenous contrast was administered.  COMPARISON:  None.  FINDINGS: This study is degraded by patient motion. Vertebral body height and alignment are maintained. The thoracic cord is unremarkable. No obvious central canal or foraminal stenosis is identified. Multilevel disc desiccation without focal bulge or protrusion is noted. Visualized paraspinous structures are unremarkable.  IMPRESSION: Motion degraded study demonstrates only mild degenerative disease without central canal or foraminal stenosis. No finding to explain the patient's symptoms is identified.   Electronically Signed   By: Inge Rise M.D.   On: 10/16/2014 11:27   Mr Lumbar Spine Wo Contrast  10/16/2014   CLINICAL DATA:  Bilateral leg weakness.  Altered mental status.  EXAM: MRI LUMBAR SPINE WITHOUT CONTRAST  TECHNIQUE: Multiplanar, multisequence MR imaging of the lumbar spine was performed. No intravenous contrast was administered.  COMPARISON:  MRI lumbar spine 08/02/2012.  FINDINGS: The study is degraded by patient motion. Vertebral body height and alignment are maintained. Degenerative endplate signal change at L5-S1 is stable in appearance. The conus medullaris is normal in signal and position. Visualized intra-abdominal contents are unremarkable.  L1-2:  Negative.  L2-3:  Negative.  L3-4:  Negative.  L4-5: Ligamentum flavum thickening and a tiny central protrusion. Mild central canal narrowing is present. Foramina are open.  L5-S1: Epidural fat is prominent. There is a broad-based disc bulge but the central canal and foramina  appear open.  IMPRESSION: Motion degraded study demonstrates no finding to explain the patient's symptoms with mild spondylosis at L4-5 and L5-S1 not markedly changed in appearance.   Electronically Signed   By: Inge Rise M.D.   On: 10/16/2014 11:22    Scheduled Meds: . Darunavir Ethanolate  800 mg Oral Q breakfast  . doripenem (DORIBAX) IV  500 mg Intravenous 3 times  per day  . emtricitabine-tenofovir  1 tablet Oral Daily  . fluticasone  2 puff Inhalation BID  . lubiprostone  8 mcg Oral BID WC  . raltegravir  400 mg Oral BID  . ritonavir  100 mg Oral Q breakfast  . sodium chloride  3 mL Intravenous Q12H  . vancomycin  750 mg Intravenous Q12H  . verapamil  240 mg Oral Daily   Continuous Infusions: . 0.9 % NaCl with KCl 40 mEq / L 100 mL/hr (10/16/14 2251)    Active Problems:   Human immunodeficiency virus (HIV) disease   Essential hypertension   Bipolar 1 disorder   Renal failure   Acute renal failure   Acute encephalopathy   Sepsis   Aspiration pneumonia    Time spent: 56mins  Verneita Griffes, MD [facility] (P) [phone]

## 2014-10-17 NOTE — Progress Notes (Signed)
Patient vomitted x2 this am. MD made aware and witnessed the 2nd vomitting occurrence. Patient is now NPO. Will continue to monitor.

## 2014-10-18 ENCOUNTER — Other Ambulatory Visit: Payer: Self-pay | Admitting: Licensed Clinical Social Worker

## 2014-10-18 ENCOUNTER — Other Ambulatory Visit: Payer: Self-pay | Admitting: Gastroenterology

## 2014-10-18 DIAGNOSIS — R1013 Epigastric pain: Secondary | ICD-10-CM

## 2014-10-18 DIAGNOSIS — R1030 Lower abdominal pain, unspecified: Secondary | ICD-10-CM

## 2014-10-18 LAB — CBC WITH DIFFERENTIAL/PLATELET
Basophils Absolute: 0 10*3/uL (ref 0.0–0.1)
Basophils Relative: 0 % (ref 0–1)
Eosinophils Absolute: 0.2 10*3/uL (ref 0.0–0.7)
Eosinophils Relative: 1 % (ref 0–5)
HEMATOCRIT: 27.1 % — AB (ref 36.0–46.0)
Hemoglobin: 8.8 g/dL — ABNORMAL LOW (ref 12.0–15.0)
LYMPHS PCT: 21 % (ref 12–46)
Lymphs Abs: 2.7 10*3/uL (ref 0.7–4.0)
MCH: 28.9 pg (ref 26.0–34.0)
MCHC: 32.5 g/dL (ref 30.0–36.0)
MCV: 88.9 fL (ref 78.0–100.0)
MONO ABS: 1.1 10*3/uL — AB (ref 0.1–1.0)
Monocytes Relative: 8 % (ref 3–12)
Neutro Abs: 9.3 10*3/uL — ABNORMAL HIGH (ref 1.7–7.7)
Neutrophils Relative %: 70 % (ref 43–77)
Platelets: 299 10*3/uL (ref 150–400)
RBC: 3.05 MIL/uL — ABNORMAL LOW (ref 3.87–5.11)
RDW: 14.6 % (ref 11.5–15.5)
WBC: 13.3 10*3/uL — AB (ref 4.0–10.5)

## 2014-10-18 LAB — COMPREHENSIVE METABOLIC PANEL
ALT: 24 U/L (ref 0–35)
ANION GAP: 5 (ref 5–15)
AST: 34 U/L (ref 0–37)
Albumin: 1.8 g/dL — ABNORMAL LOW (ref 3.5–5.2)
Alkaline Phosphatase: 96 U/L (ref 39–117)
BUN: <5 mg/dL — ABNORMAL LOW (ref 6–23)
CHLORIDE: 110 meq/L (ref 96–112)
CO2: 25 mmol/L (ref 19–32)
Calcium: 7.7 mg/dL — ABNORMAL LOW (ref 8.4–10.5)
Creatinine, Ser: 0.55 mg/dL (ref 0.50–1.10)
GFR calc non Af Amer: >90 mL/min (ref >90)
Glucose, Bld: 94 mg/dL (ref 70–99)
POTASSIUM: 4.3 mmol/L (ref 3.5–5.1)
Sodium: 140 mmol/L (ref 135–145)
Total Bilirubin: 0.6 mg/dL (ref 0.3–1.2)
Total Protein: 5.6 g/dL — ABNORMAL LOW (ref 6.0–8.3)

## 2014-10-18 MED ORDER — VERAPAMIL HCL ER 240 MG PO TBCR
360.0000 mg | EXTENDED_RELEASE_TABLET | Freq: Every day | ORAL | Status: DC
  Administered 2014-10-18 – 2014-10-20 (×3): 360 mg via ORAL
  Filled 2014-10-18 (×3): qty 2

## 2014-10-18 NOTE — Evaluation (Signed)
Physical Therapy Evaluation Patient Details Name: [name] MRN: [id number] DOB: 1961/06/28 Today's Date: 10/18/2014   History of Present Illness  This is a 53 year old lady who was sent by EMS for evaluation of vomiting. The patient herself cannot give me any clear history because she is so unwell and appears to be nonverbal. Evaluation in the emergency room with blood work showed her to be in acute renal failure with hyperkalemia and metabolic acidosis. Her blood pressure remained stable, even somewhat hypertensive. She is being admitted for treatment of her acute renal failure with a creatinine of 11.63. Previously, only a couple weeks ago her creatinine was in the normal range of 0.52. From the medical records, it appears that she has HIV and bipolar disorder as diagnoses listed. She cannot really give me any further history at the present time.  Clinical Impression   Pt was seen for evaluation and found to be extremely weak and deconditioned.  She was lethargic but was cooperative and able to follow directions.  She frequently c/o pain in LEs although she was unable to be more specific than that.  The pain was of a cramping nature and she did display some rigidity in both LEs.  She was able to sit at edge of bed with mod assist.  She was able to stand with a walker momentarily but unable to take any steps.  She will not be able to go home at d/c and will definitely need SNF.  Pt is agreeable to this.    Follow Up Recommendations SNF    Equipment Recommendations  None recommended by PT    Recommendations for Other Services   none    Precautions / Restrictions Precautions Precautions: Fall Precaution Comments: C diff precautions, HIV + Restrictions Weight Bearing Restrictions: No      Mobility  Bed Mobility Overal bed mobility: Modified Independent             General bed mobility comments: rolling in the bed is very labored for pt and she uses bedrails extensively to  assist  Transfers Overall transfer level: Needs assistance Equipment used: Rolling walker (2 wheeled) Transfers: Sit to/from Stand Sit to Stand: Mod assist         General transfer comment: pt is only able to stand at bedside for about 15 seconds and then sits on the bed  Ambulation/Gait Ambulation/Gait assistance:  (unable to walk)              Stairs            Wheelchair Mobility    Modified Rankin (Stroke Patients Only)       Balance Overall balance assessment: No apparent balance deficits (not formally assessed)                                           Pertinent Vitals/Pain Pain Assessment: Faces Pain Location: in LEs but unable to be specific Pain Descriptors / Indicators: Cramping Pain Intervention(s): Limited activity within patient's tolerance;Repositioned;Other (comment) (alerted RN)    Home Living Family/patient expects to be discharged to:: Skilled nursing facility                      Prior Function Level of Independence: Needs assistance   Gait / Transfers Assistance Needed: pt states that she ambulates with a walker independently, it is assumed that she is independent  with transfers  ADL's / Homemaking Assistance Needed: assist with all ADLs        Hand Dominance   Dominant Hand: Right    Extremity/Trunk Assessment   Upper Extremity Assessment: Defer to OT evaluation           Lower Extremity Assessment: LLE deficits/detail   LLE Deficits / Details: s/p TKR in May 2015 with decreased knee flexion to about 70 degrees     Communication   Communication: No difficulties  Cognition Arousal/Alertness: Lethargic Behavior During Therapy: Anxious Overall Cognitive Status: No family/caregiver present to determine baseline cognitive functioning                      General Comments      Exercises        Assessment/Plan    PT Assessment Patient needs continued PT services  PT  Diagnosis Generalized weakness;Acute pain;Difficulty walking   PT Problem List Decreased strength;Decreased range of motion;Decreased activity tolerance;Decreased mobility;Pain  PT Treatment Interventions Functional mobility training;Therapeutic exercise;Gait training   PT Goals (Current goals can be found in the Care Plan section) Acute Rehab PT Goals Patient Stated Goal: none stated PT Goal Formulation: With patient Time For Goal Achievement: 11/01/14 Potential to Achieve Goals: Fair    Frequency Min 2X/week   Barriers to discharge Decreased caregiver support      Co-evaluation               End of Session Equipment Utilized During Treatment: Gait belt Activity Tolerance: Patient limited by fatigue;Patient limited by lethargy Patient left: in bed;with call bell/phone within reach;with bed alarm set Nurse Communication: Mobility status         Time: 1025-8527 PT Time Calculation (min) (ACUTE ONLY): 37 min   Charges:   PT Evaluation $Initial PT Evaluation Tier I: 1 Procedure     PT G CodesDemetrios Isaacs L 10/18/2014, 2:21 PM

## 2014-10-18 NOTE — Care Management Utilization Note (Signed)
UR complete 

## 2014-10-18 NOTE — Progress Notes (Signed)
[facility] PROGRESS NOTE  Henlee Donovan KYH:[id number] DOB: 12-06-1960 DOA: 10/12/2014 PCP: Barbette Merino, MD  Summary:  This is a 53 y/o female who presented to the hospital with lethargy, nausea and vomiting. Blood work in ED revealed acute renal failure with a creatinine above 11. Foley catheter was placed in ED with significant urine output and improvement in her renal failure. 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 MRI of the T-L spine were unrevealing. She will need outpatient follow up with urology and will likely need to be discharged with foley catheter in place. She did have a possible aspiration event, leading to sepsis and pneumonia. She is on appropriate antibiotics at this time. Her lethargy has improved and was also felt to be related to medications/sepsis. MRI of the brain and lumbar puncture were unremarkable. She is slowly improving but then proceeded to have significant n/v.  It was noted that she was Rx recently for ? Candida esophagitis by Dr. 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 She developed nausea and vomiting and then diarrhea and was found to be C. Difficile positive subsequently and was started on oral vancomycin   Assessment/Plan: 1. Acute renal failure. Creatinine on admission was 11.63. With placement of Foley catheter she had approximately 7.8 L of urine output within first 24 hours of admission. This would indicate a postobstructive uropathy. Renal ultrasound done did not show any evidence of hydronephrosis or other acute findings. Continue foley catheter for now . Creatinine has now improved. Continue IV fluids. Appreciate nephrology input. 2. C. Difficile colitis-continue vancomycin by mouth 4 times a day started on 12/27-stop  date1/10/50 3. 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 Urinary retention. I suspect this is related to medications. Case discussed with Dr. Leonel Ramsay on call for neurology. Agreed that this is likely medication related, but felt it would be appropriate to MRI her spine. Will order MRI L and T spine. Case also discussed with Dr. Gaynelle Arabian, on-call for urology. Failed voiding trial 12/26 so might need long term foley--urine culture pending from 12/27, follow 5. Sepsis. Patient was having fevers and developed a significant leukocytosis. Urine culture has shown no growth. Blood cultures have shown no growth yet. Lumbar puncture was also thought to be unremarkable.Chest x-ray indicates possible aspiration pneumonia. She did have a possible aspiration event and MRI. . IV abx d/c 12/27-received 5 days total Vanc and Doripenem.  Follow blood culture and urine culture as above 6. Hyperkalemia. Related to #1. Resolved. 7. Hypokalemia/hypomagnesemia. Likely related to diuresis from recovering renal failure. Replace. 8. Acute encephalopathy. Possibly related to developing sepsis, versus residual encephalopathy from renal failure. Urine drug screen appropriately positive for benzos. ABG, ammonia unremarkable. MRI of the brain did not show any acute findings. Xanax and oxycodone that she was taking prior to admission have been discontinued. This appears to be gradually improving--her baseline status according to her partner is that she is pretty tangential and occasionally confused 9. HIV. Recent CD4 count from 06/2014 was 1330 indicating good compliance. She is on antiretroviral therapy.  10. Hypertension. Patient takes clonidine, verapamil as an outpatient. Blood pressures are Jai normal. I have increased her home dose of 240 mg verapamil  to 360 mg  Code Status:  full code Family Communication: no family present-discussed with significant other 12/27 Disposition Plan: pending hospital course-can potentially transfer to telemetry later today   Consultants:  Nephrology  Procedures:  Lumbar puncture on 12/24  Antibiotics:  Vancomycin 12/23>>12/27  Zosyn/Doripenem 12/23>>12/27  Fluconazole 12/27  Vancomycin by mouth 12/27  HPI/Subjective:  Alert oriented pleasant Tolerating diet fairly well Still a little nauseous Stool appears to be firming up No blurred vision no double vision no other issues Remains tangential  Objective: Filed Vitals:   10/18/14 0820  BP:   Pulse:   Temp: 98.2 F (36.8 C)  Resp:     Intake/Output Summary (Last 24 hours) at 10/18/14 0947 Last data filed at 10/18/14 0500  Gross per 24 hour  Intake      0 ml  Output   1600 ml  Net  -1600 ml   Filed Weights   10/16/14 0500 10/17/14 0500 10/18/14 0500  Weight: 72.9 kg (160 lb 11.5 oz) 71 kg (156 lb 8.4 oz) 72.4 kg (159 lb 9.8 oz)    Exam:   General:  NAD  Cardiovascular: s1, s2, tachycardic  Respiratory: CTA B  Abdomen: soft, diffusely tender, nd, bs+  Musculoskeletal:  No edema b/l    Data Reviewed: Basic Metabolic Panel:  Recent Labs Lab 10/13/14 1123 10/14/14 0537 10/15/14 1000 10/16/14 0556 10/16/14 0650 10/16/14 1612 10/18/14 0539  NA 137 139 138 138  --   --  140  K 3.9 3.7 2.8* 2.5*  --  3.2* 4.3  CL 101 108 106 111  --   --  110  CO2 24 19 23 22   --   --  25  GLUCOSE 132* 122* 81 126*  --   --  94  BUN 42* 34* 13 5*  --   --  <5*  CREATININE 1.31* 1.11* 0.49* 0.48*  --   --  0.55  CALCIUM 8.9 8.0* 7.4* 7.0*  --   --  7.7*  MG  --   --  0.9*  --  1.4*  --   --    Liver Function Tests:  Recent Labs Lab 10/12/14 1459 10/13/14 0450 10/18/14 0539  AST 59* 82* 34  ALT 21 25 24   ALKPHOS 135* 112 96  BILITOT 2.2* 1.7* 0.6  PROT 8.5* 7.7 5.6*  ALBUMIN 2.6* 2.5* 1.8*    Recent Labs Lab  10/12/14 1700  LIPASE 15    Recent Labs Lab 10/12/14 1517 10/13/14 1123  AMMONIA 31 38*   CBC:  Recent Labs Lab 10/12/14 2045 10/13/14 0450 10/14/14 0537 10/15/14 1000 10/16/14 0556 10/18/14 0539  WBC 13.2* 12.1* 21.7* 20.7* 16.1* 13.3*  NEUTROABS 10.4*  --   --   --   --  9.3*  HGB 10.8* 11.6* 9.8* 9.1* 8.3* 8.8*  HCT 32.3* 34.5* 30.3* 27.3* 25.3* 27.1*  MCV 86.8 86.5 90.2 87.2 88.2 88.9  PLT 292 289 215 254 237 299   Cardiac Enzymes:  Recent Labs Lab 10/12/14 1700  TROPONINI 0.03   BNP (last 3 results) No results for input(s): PROBNP in the last 8760 hours. CBG: No results for input(s): GLUCAP in the last 168 hours.  Recent Results (from the past 240 hour(s))  Urine culture     Status: None   Collection Time: 10/12/14  2:50 PM  Result Value Ref Range Status   Specimen Description URINE, CATHETERIZED  Final   Special Requests NONE  Final   Culture  Setup Time   Final  10/13/2014 00:54 Performed at [place] Performed at [facility]   Final   Culture NO GROWTH Performed at [facility]   Final   Report Status 10/13/2014 FINAL  Final  Culture, blood (routine x 2)     Status: None   Collection Time: 10/12/14  3:17 PM  Result Value Ref Range Status   Specimen Description BLOOD RIGHT HAND  Final   Special Requests BOTTLES DRAWN AEROBIC ONLY 5CC  Final   Culture NO GROWTH 5 DAYS  Final   Report Status 10/17/2014 FINAL  Final  Culture, blood (routine x 2)     Status: None   Collection Time: 10/12/14  5:00 PM  Result Value Ref Range Status   Specimen Description BLOOD RIGHT HAND  Final   Special Requests BOTTLES DRAWN AEROBIC ONLY 5CC  Final   Culture NO GROWTH 5 DAYS  Final   Report Status 10/17/2014 FINAL  Final  MRSA PCR Screening     Status: None   Collection Time: 10/12/14  8:00 PM  Result Value Ref Range Status   MRSA by PCR NEGATIVE NEGATIVE Final    Comment:        The GeneXpert MRSA  Assay (FDA approved for NASAL specimens only), is one component of a comprehensive MRSA colonization surveillance program. It is not intended to diagnose MRSA infection nor to guide or monitor treatment for MRSA infections.   CSF culture     Status: None   Collection Time: 10/14/14 10:50 AM  Result Value Ref Range Status   Specimen Description CSF  Final   Special Requests NONE  Final   Gram Stain   Final    WBC PRESENT, PREDOMINANTLY MONONUCLEAR NO ORGANISMS SEEN CYTOSPIN Performed at [facility] Performed at [facility]   Final    NO GROWTH 3 DAYS Performed at [facility]    Report Status 10/17/2014 FINAL  Final  Clostridium Difficile by PCR     Status: Abnormal   Collection Time: 10/17/14  3:41 PM  Result Value Ref Range Status   C difficile by pcr POSITIVE (A) NEGATIVE Final    Comment: CRITICAL RESULT CALLED TO, READ BACK BY AND VERIFIED WITH: N.ROWE AT 1696 ON 10/17/14 BY S.VANHOORNE   Culture, blood (routine x 2)     Status: None (Preliminary result)   Collection Time: 10/17/14  9:09 PM  Result Value Ref Range Status   Specimen Description BLOOD LEFT HAND  Final   Special Requests BOTTLES DRAWN AEROBIC ONLY 5CC  Final   Culture PENDING  Incomplete   Report Status PENDING  Incomplete     Studies: Mr Thoracic Spine Wo Contrast  10/16/2014   CLINICAL DATA:  Bilateral lower extremity weakness. Altered mental status.  EXAM: MRI THORACIC SPINE WITHOUT CONTRAST  TECHNIQUE: Multiplanar, multisequence MR imaging of the thoracic spine was performed. No intravenous contrast was administered.  COMPARISON:  None.  FINDINGS: This study is degraded by patient motion. Vertebral body height and alignment are maintained. The thoracic cord is unremarkable. No obvious central canal or foraminal stenosis is identified. Multilevel disc desiccation without focal bulge or protrusion is noted. Visualized paraspinous structures are unremarkable.   IMPRESSION: Motion degraded study demonstrates only mild degenerative disease without central canal or foraminal stenosis. No finding to explain the patient's symptoms is identified.   Electronically Signed   By: Inge Rise M.D.   On: 10/16/2014 11:27  Mr Lumbar Spine Wo Contrast  10/16/2014   CLINICAL DATA:  Bilateral leg weakness.  Altered mental status.  EXAM: MRI LUMBAR SPINE WITHOUT CONTRAST  TECHNIQUE: Multiplanar, multisequence MR imaging of the lumbar spine was performed. No intravenous contrast was administered.  COMPARISON:  MRI lumbar spine 08/02/2012.  FINDINGS: The study is degraded by patient motion. Vertebral body height and alignment are maintained. Degenerative endplate signal change at L5-S1 is stable in appearance. The conus medullaris is normal in signal and position. Visualized intra-abdominal contents are unremarkable.  L1-2:  Negative.  L2-3:  Negative.  L3-4:  Negative.  L4-5: Ligamentum flavum thickening and a tiny central protrusion. Mild central canal narrowing is present. Foramina are open.  L5-S1: Epidural fat is prominent. There is a broad-based disc bulge but the central canal and foramina appear open.  IMPRESSION: Motion degraded study demonstrates no finding to explain the patient's symptoms with mild spondylosis at L4-5 and L5-S1 not markedly changed in appearance.   Electronically Signed   By: Inge Rise M.D.   On: 10/16/2014 11:22   Dg Chest Port 1 View  10/17/2014   CLINICAL DATA:  Fever  EXAM: PORTABLE CHEST - 1 VIEW  COMPARISON:  10/13/2014  FINDINGS: Cardiomediastinal silhouette is stable. No acute infiltrate or pulmonary edema. Again noted right shoulder prosthesis. Stable degenerative changes and remodeling left humeral head.  IMPRESSION: No active disease.   Electronically Signed   By: Lahoma Crocker M.D.   On: 10/17/2014 17:24    Scheduled Meds: . Darunavir Ethanolate  800 mg Oral Q breakfast  . emtricitabine-tenofovir  1 tablet Oral Daily  .  fluconazole  100 mg Oral Daily  . fluticasone  2 puff Inhalation BID  . raltegravir  400 mg Oral BID  . ritonavir  100 mg Oral Q breakfast  . sodium chloride  3 mL Intravenous Q12H  . vancomycin  125 mg Oral QID  . verapamil  240 mg Oral Daily   Continuous Infusions: . sodium chloride 75 mL/hr at 10/18/14 0720    Active Problems:   Human immunodeficiency virus (HIV) disease   Essential hypertension   Bipolar 1 disorder   Renal failure   Acute renal failure   Acute encephalopathy   Sepsis   Aspiration pneumonia    Time spent: 15 minutes  Verneita Griffes, MD [facility] (P) [phone]

## 2014-10-19 LAB — CBC WITH DIFFERENTIAL/PLATELET
BASOS PCT: 0 % (ref 0–1)
Basophils Absolute: 0 10*3/uL (ref 0.0–0.1)
Eosinophils Absolute: 0.1 10*3/uL (ref 0.0–0.7)
Eosinophils Relative: 1 % (ref 0–5)
HCT: 26.4 % — ABNORMAL LOW (ref 36.0–46.0)
Hemoglobin: 8.6 g/dL — ABNORMAL LOW (ref 12.0–15.0)
Lymphocytes Relative: 25 % (ref 12–46)
Lymphs Abs: 2.9 10*3/uL (ref 0.7–4.0)
MCH: 28.9 pg (ref 26.0–34.0)
MCHC: 32.6 g/dL (ref 30.0–36.0)
MCV: 88.6 fL (ref 78.0–100.0)
Monocytes Absolute: 0.9 10*3/uL (ref 0.1–1.0)
Monocytes Relative: 8 % (ref 3–12)
NEUTROS ABS: 7.6 10*3/uL (ref 1.7–7.7)
Neutrophils Relative %: 66 % (ref 43–77)
Platelets: 322 10*3/uL (ref 150–400)
RBC: 2.98 MIL/uL — ABNORMAL LOW (ref 3.87–5.11)
RDW: 14.5 % (ref 11.5–15.5)
WBC: 11.6 10*3/uL — ABNORMAL HIGH (ref 4.0–10.5)

## 2014-10-19 LAB — COMPREHENSIVE METABOLIC PANEL
ALBUMIN: 1.7 g/dL — AB (ref 3.5–5.2)
ALK PHOS: 96 U/L (ref 39–117)
ALT: 22 U/L (ref 0–35)
AST: 30 U/L (ref 0–37)
Anion gap: 7 (ref 5–15)
BILIRUBIN TOTAL: 0.6 mg/dL (ref 0.3–1.2)
BUN: 5 mg/dL — AB (ref 6–23)
CHLORIDE: 107 meq/L (ref 96–112)
CO2: 25 mmol/L (ref 19–32)
Calcium: 7.3 mg/dL — ABNORMAL LOW (ref 8.4–10.5)
Creatinine, Ser: 0.53 mg/dL (ref 0.50–1.10)
GFR calc Af Amer: >90 mL/min (ref >90)
Glucose, Bld: 87 mg/dL (ref 70–99)
POTASSIUM: 3.8 mmol/L (ref 3.5–5.1)
Sodium: 139 mmol/L (ref 135–145)
Total Protein: 5.3 g/dL — ABNORMAL LOW (ref 6.0–8.3)

## 2014-10-19 LAB — URINE CULTURE
Colony Count: NO GROWTH
Culture: NO GROWTH

## 2014-10-19 MED ORDER — ALUM & MAG HYDROXIDE-SIMETH 200-200-20 MG/5ML PO SUSP
15.0000 mL | Freq: Four times a day (QID) | ORAL | Status: DC | PRN
  Administered 2014-10-19 – 2014-10-20 (×3): 15 mL via ORAL
  Filled 2014-10-19 (×3): qty 30

## 2014-10-19 NOTE — Care Management Utilization Note (Signed)
UR complete 

## 2014-10-19 NOTE — Progress Notes (Signed)
Patient failed the voiding trial and foley cath was replaced per prior electronic order.  14 french foley cath placed patient tolerated well.  600 cc of amber urine with sediment returned.

## 2014-10-19 NOTE — Clinical Social Work Psychosocial (Signed)
Clinical Social Work Department BRIEF PSYCHOSOCIAL ASSESSMENT 10/19/2014  Patient:  [name], [name]     Account Number:  [id number]     Admit date:  10/12/2014  Clinical Social Worker:  Legrand Como  Date/Time:  10/19/2014 11:29 AM  Referred by:  CSW  Date Referred:  10/19/2014 Referred for  SNF Placement   Other Referral:   Interview type:  Patient Other interview type:    PSYCHOSOCIAL DATA Living Status:  SIGNIFICANT OTHER Admitted from facility:   Level of care:  [place] Primary support name:  Chauncey Mann Primary support relationship to patient:  PARTNER Degree of support available:   Patient reports that her boyfriend is very supportive.    CURRENT CONCERNS Current Concerns  Post-Acute Placement   Other Concerns:    SOCIAL WORK ASSESSMENT / PLAN CSW met with patient who was alert and oriented.  Patient advised that  she has a home health aid that comes Monday through Saturday through [facility].  She stated that she ambulates with a walker. Patient indicated that she receives assistance with her ADL's from her home health aid.  She reported a fair amount of family support. Patient indicated that her most supportive person is her boyfriend, Chauncey Mann.  Patient indicated that she and Mr. Berenice Primas live together.  She indicated that she does not desire to go to SNF.  Patient stated that she would prefer to have her rehab at the outpatient center.  Patient indicated that she has been in rehab at the out patient center in the past, but she stopped due to difficulty breathing.   Assessment/plan status:  Information/Referral to [employer] Other assessment/ plan:   Information/referral to community resources:    PATIENT'S/FAMILY'S RESPONSE TO PLAN OF CARE: Patient does not want to go to SNF. Patient wishes to participate in rehab on an outpatient basis.   Ambrose Pancoast, [place]

## 2014-10-19 NOTE — Evaluation (Signed)
Clinical/Bedside Swallow Evaluation Patient Details  Name: [name] MRN: [id number] DOB: 09/13/1961  Today's Date: 10/19/2014 Time: 7:38 PM - 8:00 PM    Past Medical History:  Past Medical History  Diagnosis Date  . Baker's cyst   . Pulmonary lesion 1/03    on RUL onn CT   . Hemorrhoids   . Zoster 2001  . Family history of diabetes mellitus   . Tobacco user   . Other abnormal Papanicolaou smear of cervix and cervical HPV(795.09)   . Herpes zoster   . Hemorrhoids   . Baker's cyst   . Pancreatitis   . HIV disease     take Prezista,Truvada,Norvir,and Isentress daily  . Bipolar disorder     With hx of psychotic features.  . Articular cartilage disorder of knee   . Bursitis of shoulder   . Rotator cuff arthropathy   . Hypertension     takes Clonidine and Verapamil daily  . Emphysema   . Asthma     Albuterol resue inhaler and QVAR daily  . Shortness of breath     with exertion and stress  . Dizziness   . Joint pain   . Joint swelling   . Chronic back pain     buldging disc  . Bruises easily   . Gastric ulcer   . GERD (gastroesophageal reflux disease)     takes Nexium daily  . Chronic constipation   . Urinary frequency   . Urinary incontinence   . Nocturia   . History of blood transfusion at age 26  . Early cataracts, bilateral   . Anxiety   . Depression     takes Xanax daily and Depakote bid  . History of shingles   . 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 Tobacco abuse 10/26/2012  . Chronic back pain   . Chronic knee pain   . Complication of anesthesia     AGGITATED  . Uterine cancer     "had some kind of cancer in my teens; had to have TAH to remove it"  . Headache(784.0)     "often here lately; weather changing"  . Pneumonia     "several times"  . Arthritis     "all over" (03/16/2014)  . Osteoarthritis of left knee 03/16/2014  . Alcohol withdrawal delirium 03/22/2014   Past Surgical History:  Past Surgical  History  Procedure Laterality Date  . Breast lumpectomy Right   . Breast biopsy Right ~ 1980  . Cholecystectomy  10+yrs ago  . Knee arthroscopy Left   . Finger surgery Right     rt middle finger with pin in it  . Epidural injections    . Esophagogastroduodenoscopy    . Shoulder hemi-arthroplasty  05/06/2012    Procedure: SHOULDER HEMI-ARTHROPLASTY;  Surgeon: Johnny Bridge, MD;  Location: [facility];  Service: Orthopedics;  Laterality: Right;  . Joint replacement    . Fracture surgery      Right finger third digit  . Appendectomy  1981    with hysterectomy  . Total knee arthroplasty Left 03/16/2014    Procedure: LEFT TOTAL KNEE ARTHROPLASTY;  Surgeon: Johnny Bridge, MD;  Location: [place];  Service: Orthopedics;  Laterality: Left;  . Total abdominal hysterectomy  1981    w/BSO  . Esophagogastroduodenoscopy (egd) with propofol N/A 09/30/2014    Procedure: ESOPHAGOGASTRODUODENOSCOPY (EGD) WITH PROPOFOL;  Surgeon: Daneil Dolin, MD;  Location: [facility];  Service: Endoscopy;  Laterality: N/A;  .  Esophageal biopsy  09/30/2014    Procedure: GASTRIC AND ESOPHAGEAL BIOPSY;  Surgeon: Daneil Dolin, MD;  Location: [facility];  Service: Endoscopy;;   HPI:  [name] is a 53 y/o female who presented to the hospital with lethargy, nausea and vomiting. Blood work in ED revealed acute renal failure with a creatinine above 11. Foley catheter was placed in ED with significant urine output and improvement in her renal failure. 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 MRI of the T-L spine were unrevealing. She will need outpatient follow up with urology and will likely need to be discharged with foley catheter in place. She did have a possible aspiration event, leading to sepsis and pneumonia. She is on appropriate antibiotics at this time. Her lethargy has improved and was also felt to be related to  medications/sepsis. MRI of the brain and lumbar puncture were unremarkable. She is slowly improving but then proceeded to have significant n/v. It was noted that she was Rx recently for ? Candida esophagitis by Dr. 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 Most recent chest xray showed: No acute infiltrate or pulmonary edema. Again noted right shoulder prosthesis. Stable degenerative changes and remodeling left humeral head. Chest x-ray a few days prior showed: Patchy interstitial and alveolar left lung opacities and mild right lung interstitial disease. Overall appearance may reflect atypical infection given the patient's medical history versus aspiration versus atypical pulmonary edema. Pt currently on clear liquid diet. SLP asked to evaluate swallow function at bedside.    Assessment/Recommendations/Treatment Plan    SLP Assessment Clinical Impression Statement: Ms. Ogden shows no overt signs or symptoms of aspiration at bedside, however trials were limited due to pt declining additional po. Pt denies difficulty swallowing prior to admission. Overall, swallow function appears WNL and safe to resume regular textures and thin liquids once cleared medically (on clear liquids now). Pt became increasingly confused toward the end of my visit, requesting frequent clarification and reminders about what drink she had at bedside (she requested ginger ale). No further SLP services indicated at this time. Above to RN.  Risk for Aspiration: Mild Other Related Risk Factors: Cognitive impairment, History of pneumonia  Swallow Evaluation Recommendations Diet Recommendations: Regular, Thin liquid Liquid Administration via: Cup, Straw Medication Administration: Whole meds with liquid Supervision: Patient able to self feed Postural Changes and/or Swallow Maneuvers: Seated upright 90 degrees, Out of  bed for meals, Upright 30-60 min after meal Oral Care Recommendations: Oral care BID Other Recommendations: Clarify dietary restrictions Follow up Recommendations: None  Treatment Plan Treatment Plan Recommendations: No treatment recommended at this time     Individuals Consulted Consulted and Agree with Results and Recommendations: Patient, RN  Swallowing Goals   N/A  Swallow Study Prior Functional Status   Consumed regular textures and thin liquids PTA.  General  Date of Onset: 10/12/14 HPI: Ms. Escalante is a 53 y/o female who presented to the hospital with lethargy, nausea and vomiting. Blood work in ED revealed acute renal failure with a creatinine above 11. Foley catheter was placed in ED with significant urine output and improvement in her renal failure. 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 MRI of the T-L spine were unrevealing. She will need outpatient follow up with urology and will likely need  to be discharged with foley catheter in place. She did have a possible aspiration event, leading to sepsis and pneumonia. She is on appropriate antibiotics at this time. Her lethargy has improved and was also felt to be related to medications/sepsis. MRI of the brain and lumbar puncture were unremarkable. She is slowly improving but then proceeded to have significant n/v. It was noted that she was Rx recently for ? Candida esophagitis by Dr. 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 Most recent chest xray showed: No acute infiltrate or pulmonary edema. Again noted right shoulder prosthesis. Stable degenerative changes and remodeling left humeral head. Chest x-ray a few days prior showed: Patchy interstitial and alveolar left lung opacities and mild right lung interstitial disease.  Overall appearance may reflect atypical infection given the patient's medical history versus aspiration versus atypical pulmonary edema. Pt currently on clear liquid diet. SLP asked to evaluate swallow function at bedside.  Type of Study: Bedside swallow evaluation Diet Prior to this Study: Thin liquids (clear liquids) Temperature Spikes Noted: No Respiratory Status: Room air History of Recent Intubation: No Behavior/Cognition: Alert, Cooperative, Confused Oral Cavity - Dentition: Adequate natural dentition Self-Feeding Abilities: Able to feed self Patient Positioning: Upright in bed Baseline Vocal Quality: Clear Volitional Cough: Strong Volitional Swallow: Able to elicit  Oral Motor/Sensory Function  Overall Oral Motor/Sensory Function: Appears within functional limits for tasks assessed  Consistency Results  Ice Chips Ice chips: Within functional limits Presentation: Spoon  Thin Liquid Thin Liquid: Within functional limits Presentation: Cup;Self Fed;Straw  Nectar Thick Liquid Nectar Thick Liquid: Not tested  Honey Thick Liquid Honey Thick Liquid: Not tested  Puree Puree: Within functional limits Presentation: Spoon  Solid Solid: Within functional limits Presentation: Self Fed  Thank you,  Genene Churn, Mark  [place] 10/19/2014,8:08 PM

## 2014-10-19 NOTE — Progress Notes (Signed)
PT TRANSFERRED TO ROOM 201. PT ALERT AND ORIENTED TO PERSON,PLACE ,SITUATION. FOLEY IS OUT. WE ARE WAITING FOR PT TO VOID. DIAHRE

## 2014-10-19 NOTE — Progress Notes (Addendum)
[facility] PROGRESS NOTE  [name] ZDG:[id number] DOB: 1960-11-25 DOA: 10/12/2014 PCP: Barbette Merino, MD  Summary:   53 y/o female who presented to the hospital with lethargy, nausea and vomiting. Blood work in ED revealed acute renal failure with a creatinine above 11. Foley catheter was placed in ED with significant urine output and improvement in her renal failure. 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 MRI of the T-L spine were unrevealing. She will need outpatient follow up with urology and will likely need to be discharged with foley catheter in place. She did have a possible aspiration event, leading to sepsis and pneumonia. She is on appropriate antibiotics at this time. Her lethargy has improved and was also felt to be related to medications/sepsis. MRI of the brain and lumbar puncture were unremarkable. She is slowly improving but then proceeded to have significant n/v.  It was noted that she was Rx recently for ? Candida esophagitis by Dr. 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 She developed nausea and vomiting and then diarrhea and was found to be C. Difficile positive subsequently and was started on oral vancomycin   Assessment/Plan: 1. Acute renal failure. Creatinine on admission was 11.63. With placement of Foley catheter she had approximately 7.8 L of urine output within first 24 hours of admission. This would indicate a postobstructive uropathy. Renal ultrasound done did not show any evidence of hydronephrosis or other acute findings. Continue foley catheter for now . Creatinine has now improved. Continue IV fluids. Appreciate nephrology input. 2. C. Difficile colitis-continue vancomycin by mouth 4 times a day started on 12/27-stop date1/10/50-she still does  have loose stools and had 2 this morning so we'll probably need to monitor on telemetry for another day at least 3. N/V-probable Candida esophagitis as pathology from this month showed Candida-was started on IV fluconazole 12/27 transitioned to by mouth fluconazole to complete 2 weeks-care with multiple HIV medications given risk interactions 4. Urinary retention. I suspect this is related to medications. Case discussed with Dr. Leonel Ramsay on call for neurology. Agreed that this is likely medication related, but felt it would be appropriate to MRI her spine. Will order MRI L and T spine. Case also discussed with Dr. Gaynelle Arabian, on-call for urology. Failed voiding trial 12/26 so might need long term foley--urine culture pending from 12/27, follow 5. Sepsis. Patient was having fevers and developed a significant leukocytosis. Blood cultures/urine culture/L he culture from earlier admission was negative IV abx d/c 12/27-received 5 days total Vanc and Doripenem.  6. Hyperkalemia. Related to #1. Resolved. 7. Hypokalemia/hypomagnesemia. Likely related to diuresis from recovering renal failure. Replace. 8. Acute encephalopathy. Possibly related to developing sepsis, versus residual encephalopathy from renal failure. Urine drug screen appropriately positive for benzos. ABG, ammonia unremarkable. MRI of the brain did not show any acute findings. Xanax and oxycodone that she was taking prior to admission have been discontinued. This appears to be gradually improving--her baseline status according to her partner is that she is pretty tangential and occasionally confused-she is improved greatly since admission 9. HIV. Recent CD4 count from 06/2014 was 1330 indicating good compliance. She is on antiretroviral therapy.  10. Hypertension. Patient takes clonidine, verapamil as an outpatient. Blood pressures are normal. I have increased her home dose of 240 mg verapamil to 360 mg  Code Status: full code Family  Communication: no family present-discussed  with significant other 12/27 Disposition Plan: Transfer to telemetry 12/29 potential discharge once patient decides on disposition and a safe discharge plan is in place   Consultants:  Nephrology  Procedures:  Lumbar puncture on 12/24  Antibiotics:  Vancomycin 12/23>>12/27  Zosyn/Doripenem 12/23>>12/27  Fluconazole 12/27  Vancomycin by mouth 12/27  HPI/Subjective:  More oriented Tolerating soft diet, not really hungry and does not want solid food No vaginal discharge No dysuria although still has Foley catheter 2 stools this morning which was soft No fever no chills No other somatic complaint. Is not clear as to where she was to go subsequent to discharge  Objective: Filed Vitals:   10/19/14 1000  BP:   Pulse: 102  Temp:   Resp: 21    Intake/Output Summary (Last 24 hours) at 10/19/14 1119 Last data filed at 10/19/14 1000  Gross per 24 hour  Intake   2445 ml  Output   1250 ml  Net   1195 ml   Filed Weights   10/16/14 0500 10/17/14 0500 10/18/14 0500  Weight: 72.9 kg (160 lb 11.5 oz) 71 kg (156 lb 8.4 oz) 72.4 kg (159 lb 9.8 oz)    Exam:   General:  NAD  Cardiovascular: s1, s2, tachycardic  Respiratory: CTA B  Abdomen: soft, diffusely tender, nd, bs+  Musculoskeletal:  No edema b/l    Data Reviewed: Basic Metabolic Panel:  Recent Labs Lab 10/14/14 0537 10/15/14 1000 10/16/14 0556 10/16/14 0650 10/16/14 1612 10/18/14 0539 10/19/14 0637  NA 139 138 138  --   --  140 139  K 3.7 2.8* 2.5*  --  3.2* 4.3 3.8  CL 108 106 111  --   --  110 107  CO2 19 23 22   --   --  25 25  GLUCOSE 122* 81 126*  --   --  94 87  BUN 34* 13 5*  --   --  <5* 5*  CREATININE 1.11* 0.49* 0.48*  --   --  0.55 0.53  CALCIUM 8.0* 7.4* 7.0*  --   --  7.7* 7.3*  MG  --  0.9*  --  1.4*  --   --   --    Liver Function Tests:  Recent Labs Lab 10/12/14 1459 10/13/14 0450 10/18/14 0539 10/19/14 0637  AST 59* 82* 34 30   ALT 21 25 24 22   ALKPHOS 135* 112 96 96  BILITOT 2.2* 1.7* 0.6 0.6  PROT 8.5* 7.7 5.6* 5.3*  ALBUMIN 2.6* 2.5* 1.8* 1.7*    Recent Labs Lab 10/12/14 1700  LIPASE 15    Recent Labs Lab 10/12/14 1517 10/13/14 1123  AMMONIA 31 38*   CBC:  Recent Labs Lab 10/12/14 2045  10/14/14 0537 10/15/14 1000 10/16/14 0556 10/18/14 0539 10/19/14 0637  WBC 13.2*  < > 21.7* 20.7* 16.1* 13.3* 11.6*  NEUTROABS 10.4*  --   --   --   --  9.3* 7.6  HGB 10.8*  < > 9.8* 9.1* 8.3* 8.8* 8.6*  HCT 32.3*  < > 30.3* 27.3* 25.3* 27.1* 26.4*  MCV 86.8  < > 90.2 87.2 88.2 88.9 88.6  PLT 292  < > 215 254 237 299 322  < > = values in this interval not displayed. Cardiac Enzymes:  Recent Labs Lab 10/12/14 1700  TROPONINI 0.03   BNP (last 3 results) No results for input(s): PROBNP in the last 8760 hours. CBG: No results for input(s): GLUCAP in the last 168 hours.  Recent Results (  from the past 240 hour(s))  Urine culture     Status: None   Collection Time: 10/12/14  2:50 PM  Result Value Ref Range Status   Specimen Description URINE, CATHETERIZED  Final   Special Requests NONE  Final   Culture  Setup Time   Final    10/13/2014 00:54 Performed at [place] Performed at [facility]   Final   Culture NO GROWTH Performed at [facility]   Final   Report Status 10/13/2014 FINAL  Final  Culture, blood (routine x 2)     Status: None   Collection Time: 10/12/14  3:17 PM  Result Value Ref Range Status   Specimen Description BLOOD RIGHT HAND  Final   Special Requests BOTTLES DRAWN AEROBIC ONLY 5CC  Final   Culture NO GROWTH 5 DAYS  Final   Report Status 10/17/2014 FINAL  Final  Culture, blood (routine x 2)     Status: None   Collection Time: 10/12/14  5:00 PM  Result Value Ref Range Status   Specimen Description BLOOD RIGHT HAND  Final   Special Requests BOTTLES DRAWN AEROBIC ONLY 5CC  Final   Culture NO GROWTH 5 DAYS  Final    Report Status 10/17/2014 FINAL  Final  MRSA PCR Screening     Status: None   Collection Time: 10/12/14  8:00 PM  Result Value Ref Range Status   MRSA by PCR NEGATIVE NEGATIVE Final    Comment:        The GeneXpert MRSA Assay (FDA approved for NASAL specimens only), is one component of a comprehensive MRSA colonization surveillance program. It is not intended to diagnose MRSA infection nor to guide or monitor treatment for MRSA infections.   CSF culture     Status: None   Collection Time: 10/14/14 10:50 AM  Result Value Ref Range Status   Specimen Description CSF  Final   Special Requests NONE  Final   Gram Stain   Final    WBC PRESENT, PREDOMINANTLY MONONUCLEAR NO ORGANISMS SEEN CYTOSPIN Performed at [facility] Performed at [place]   Final    NO GROWTH 3 DAYS Performed at [facility]    Report Status 10/17/2014 FINAL  Final  Clostridium Difficile by PCR     Status: Abnormal   Collection Time: 10/17/14  3:41 PM  Result Value Ref Range Status   C difficile by pcr POSITIVE (A) NEGATIVE Final    Comment: CRITICAL RESULT CALLED TO, READ BACK BY AND VERIFIED WITH: N.ROWE AT 2671 ON 10/17/14 BY S.VANHOORNE   Culture, blood (routine x 2)     Status: None (Preliminary result)   Collection Time: 10/17/14  5:01 PM  Result Value Ref Range Status   Specimen Description LEFT ANTECUBITAL  Final   Special Requests BOTTLES DRAWN AEROBIC AND ANAEROBIC 6CC  Final   Culture NO GROWTH 1 DAY  Final   Report Status PENDING  Incomplete  Culture, blood (routine x 2)     Status: None (Preliminary result)   Collection Time: 10/17/14  9:09 PM  Result Value Ref Range Status   Specimen Description BLOOD LEFT HAND  Final   Special Requests BOTTLES DRAWN AEROBIC ONLY 5CC  Final   Culture NO GROWTH 1 DAY  Final   Report Status PENDING  Incomplete     Studies: Dg Chest Port 1 View  10/17/2014   CLINICAL DATA:  Fever  EXAM: PORTABLE CHEST - 1 VIEW   COMPARISON:  10/13/2014  FINDINGS: Cardiomediastinal silhouette is stable. No acute infiltrate or pulmonary edema. Again noted right shoulder prosthesis. Stable degenerative changes and remodeling left humeral head.  IMPRESSION: No active disease.   Electronically Signed   By: Lahoma Crocker M.D.   On: 10/17/2014 17:24    Scheduled Meds: . Darunavir Ethanolate  800 mg Oral Q breakfast  . emtricitabine-tenofovir  1 tablet Oral Daily  . fluconazole  100 mg Oral Daily  . fluticasone  2 puff Inhalation BID  . raltegravir  400 mg Oral BID  . ritonavir  100 mg Oral Q breakfast  . sodium chloride  3 mL Intravenous Q12H  . vancomycin  125 mg Oral QID  . verapamil  360 mg Oral Daily   Continuous Infusions: . sodium chloride 75 mL/hr at 10/19/14 1000    Active Problems:   Human immunodeficiency virus (HIV) disease   Essential hypertension   Bipolar 1 disorder   Renal failure   Acute renal failure   Acute encephalopathy   Sepsis   Aspiration pneumonia    Time spent: 25 minutes  Verneita Griffes, MD [facility] (P) [phone]

## 2014-10-20 LAB — CBC WITH DIFFERENTIAL/PLATELET
BASOS ABS: 0 10*3/uL (ref 0.0–0.1)
BASOS PCT: 0 % (ref 0–1)
EOS ABS: 0.1 10*3/uL (ref 0.0–0.7)
EOS PCT: 1 % (ref 0–5)
HCT: 23.9 % — ABNORMAL LOW (ref 36.0–46.0)
Hemoglobin: 8 g/dL — ABNORMAL LOW (ref 12.0–15.0)
Lymphocytes Relative: 28 % (ref 12–46)
Lymphs Abs: 2.5 10*3/uL (ref 0.7–4.0)
MCH: 29.2 pg (ref 26.0–34.0)
MCHC: 33.5 g/dL (ref 30.0–36.0)
MCV: 87.2 fL (ref 78.0–100.0)
Monocytes Absolute: 0.8 10*3/uL (ref 0.1–1.0)
Monocytes Relative: 9 % (ref 3–12)
Neutro Abs: 5.5 10*3/uL (ref 1.7–7.7)
Neutrophils Relative %: 62 % (ref 43–77)
PLATELETS: 329 10*3/uL (ref 150–400)
RBC: 2.74 MIL/uL — ABNORMAL LOW (ref 3.87–5.11)
RDW: 14.5 % (ref 11.5–15.5)
WBC: 8.9 10*3/uL (ref 4.0–10.5)

## 2014-10-20 LAB — COMPREHENSIVE METABOLIC PANEL
ALBUMIN: 1.6 g/dL — AB (ref 3.5–5.2)
ALT: 19 U/L (ref 0–35)
AST: 23 U/L (ref 0–37)
Alkaline Phosphatase: 93 U/L (ref 39–117)
Anion gap: 5 (ref 5–15)
BUN: <5 mg/dL — ABNORMAL LOW (ref 6–23)
CO2: 22 mmol/L (ref 19–32)
CREATININE: 0.6 mg/dL (ref 0.50–1.10)
Calcium: 6.5 mg/dL — ABNORMAL LOW (ref 8.4–10.5)
Chloride: 109 mEq/L (ref 96–112)
GFR calc Af Amer: >90 mL/min (ref >90)
Glucose, Bld: 72 mg/dL (ref 70–99)
Potassium: 2.9 mmol/L — ABNORMAL LOW (ref 3.5–5.1)
Sodium: 136 mmol/L (ref 135–145)
TOTAL PROTEIN: 4.9 g/dL — AB (ref 6.0–8.3)
Total Bilirubin: 0.5 mg/dL (ref 0.3–1.2)

## 2014-10-20 MED ORDER — SUCRALFATE 1 G PO TABS: 1.0000 g | ORAL_TABLET | Freq: Four times a day (QID) | ORAL | Status: DC

## 2014-10-20 MED ORDER — LACTINEX PO CHEW
1.0000 | CHEWABLE_TABLET | Freq: Three times a day (TID) | ORAL | Status: DC
  Administered 2014-10-20 – 2014-10-21 (×3): 1 via ORAL
  Filled 2014-10-20 (×15): qty 1

## 2014-10-20 MED ORDER — FLUCONAZOLE 200 MG PO TABS
200.0000 mg | ORAL_TABLET | Freq: Every day | ORAL | Status: DC
  Administered 2014-10-21: 200 mg via ORAL
  Filled 2014-10-20: qty 2

## 2014-10-20 MED ORDER — FAMOTIDINE 20 MG PO TABS
20.0000 mg | ORAL_TABLET | Freq: Two times a day (BID) | ORAL | Status: DC
  Administered 2014-10-20 – 2014-10-21 (×2): 20 mg via ORAL
  Filled 2014-10-20 (×3): qty 1

## 2014-10-20 MED ORDER — KCL IN DEXTROSE-NACL 40-5-0.9 MEQ/L-%-% IV SOLN
INTRAVENOUS | Status: DC
  Administered 2014-10-20 – 2014-10-21 (×3): via INTRAVENOUS
  Filled 2014-10-20 (×18): qty 1000

## 2014-10-20 MED ORDER — POTASSIUM CHLORIDE CRYS ER 20 MEQ PO TBCR
40.0000 meq | EXTENDED_RELEASE_TABLET | Freq: Two times a day (BID) | ORAL | Status: DC
  Administered 2014-10-20 – 2014-10-21 (×2): 40 meq via ORAL
  Filled 2014-10-20 (×3): qty 2

## 2014-10-20 MED ORDER — SUCRALFATE 1 G PO TABS: 1.0000 g | ORAL_TABLET | Freq: Three times a day (TID) | ORAL | Status: DC

## 2014-10-20 MED ORDER — POTASSIUM CHLORIDE 2 MEQ/ML IV SOLN: INTRAVENOUS | Status: DC

## 2014-10-20 MED ORDER — PANTOPRAZOLE SODIUM 40 MG PO TBEC
40.0000 mg | DELAYED_RELEASE_TABLET | Freq: Every day | ORAL | Status: DC
  Administered 2014-10-20 – 2014-10-21 (×2): 40 mg via ORAL
  Filled 2014-10-20 (×2): qty 1

## 2014-10-20 MED ORDER — SODIUM CHLORIDE 0.9 % IV BOLUS (SEPSIS)
250.0000 mL | Freq: Once | INTRAVENOUS | Status: AC
  Administered 2014-10-20: 250 mL via INTRAVENOUS

## 2014-10-20 MED ORDER — FAMOTIDINE 20 MG PO TABS: 20.0000 mg | ORAL_TABLET | Freq: Every day | ORAL | Status: DC

## 2014-10-20 MED ORDER — ACETAMINOPHEN 325 MG PO TABS
650.0000 mg | ORAL_TABLET | Freq: Four times a day (QID) | ORAL | Status: DC | PRN
  Administered 2014-10-20 – 2014-10-21 (×2): 650 mg via ORAL
  Filled 2014-10-20 (×2): qty 2

## 2014-10-20 NOTE — Progress Notes (Signed)
NT notified nurse of pts BP 79/53 HR 91 O2 sat 94% RA. Pt assessed & is A&O. Pt denies lightheadedness or dizziness. Notified Dr Baltazar Najjar regarding this. Waiting to hear from dr. Abbott Pao also is c/o headache. Notified dr Baltazar Najjar regarding this too d/t no pain meds ordered. Will continue to monitor pt frequently. Bed is in lowest position and call bell is within reach.

## 2014-10-20 NOTE — Plan of Care (Signed)
Problem: Phase I Progression Outcomes Goal: Education officer, museum consult (if SNF or HD pt.) Outcome: Not Progressing Patient wants to go home with home PT

## 2014-10-20 NOTE — Progress Notes (Signed)
[facility] PROGRESS NOTE  [name] DJT:[id number] DOB: [birth year] DOA: 10/12/2014 PCP: Barbette Merino, MD  Summary:   53 y/o female who presented to the hospital with lethargy, nausea and vomiting. Blood work in ED revealed acute renal failure with a creatinine above 11. Foley catheter was placed in ED with significant urine output and improvement in her renal failure. 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 MRI of the T-L spine were unrevealing. She will need outpatient follow up with urology and will likely need to be discharged with foley catheter in place. She did have a possible aspiration event, leading to sepsis and pneumonia. She is on appropriate antibiotics at this time. Her lethargy has improved and was also felt to be related to medications/sepsis. MRI of the brain and lumbar puncture were unremarkable. She is slowly improving but then proceeded to have significant n/v.  It was noted that she was Rx recently for ? Candida esophagitis by Dr. 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 She developed nausea and vomiting and then diarrhea and was found to be C. Difficile positive subsequently and was started on oral vancomycin She has had a tumultuous couple of days with nausea and poor by mouth intake. She has not had any further vomiting however  I discussed her care with Dr. Gala Romney of gastroenterology as she started having more reflux symptoms on 12/29 and 12/30 and it was elected to start her on low-dose H2 blocker as well as Carafate. I will speak to Dr. Graylon Good of infectious disease to determine if there is any other workup that may need to be performed for her or if we can tailor her HAART medications and simplify them   Assessment/Plan: 1. Acute renal failure.  Creatinine on admission was 11.63. With placement of Foley catheter she had approximately 7.8 L of urine output within first 24 hours of admission. This would indicate a postobstructive uropathy. Renal ultrasound done did not show any evidence of hydronephrosis or other acute findings. Continue foley catheter for now . Creatinine has now improved. Continue IV fluids. Appreciate nephrology input. 2. C. Difficile colitis-continue vancomycin by mouth 4 times a day started on 12/27-stop date1/10/50-she still does have loose stools and has had 4 today so far 3. N/V-probable Candida esophagitis as pathology from this month showed Candida-was started on IV fluconazole 12/27 transitioned to by mouth fluconazole to complete 2 weeks-care with multiple HIV medications given risk interactions.  Underlying etiology also is reflux and I have started her on Protonixand Pepcid as discussed above on 12/30 with gastroenterologist + ID--it was recommended by Dr. Graylon Good of infectious disease not to use Carafate but he uses Protonix once daily and we will monitor response 4. Urinary retention. I suspect this is related to medications. Case discussed with Dr. Leonel Ramsay on call for neurology. Agreed that this is likely medication related, but felt it would be appropriate to MRI her spine. Will order MRI L and T spine. Case also discussed with Dr. Gaynelle Arabian, on-call for urology. Failed voiding trial 12/26 so might need long term foley--urine culture = from 12/27 was negative 5. Sepsis. Patient was having fevers and developed a significant leukocytosis. Blood cultures/urine culture/L he culture from earlier admission was negative IV abx d/c 12/27-received 5 days total Vanc and Doripenem.  6. Hyperkalemia. Related to #1. Resolved. 7. Hypokalemia/hypomagnesemia. Likely related to diuresis from recovering  renal failure. Replacing with IV fluid c Potassium. 8. Acute encephalopathy. Possibly related to developing sepsis, versus  residual encephalopathy from renal failure. Urine drug screen appropriately positive for benzos. ABG, ammonia unremarkable. MRI of the brain did not show any acute findings. Xanax and oxycodone that she was taking prior to admission have been discontinued. This appears to be gradually improving--her baseline status according to her partner is that she is pretty tangential and occasionally confused-she is improved greatly since admission 9. HIV. Recent CD4 count from 06/2014 was 1330 indicating good compliance. She is on antiretroviral therapy.  10. Hypertension. Patient takes clonidine, verapamil as an outpatient. Blood pressures are normal. I have increased her home dose of 240 mg verapamil to 360 mg  Code Status: full code Family Communication: no family present- Disposition Plan: Transfer to telemetry 12/29-unclear and point, possibly discharge home with significant other?   Consultants:  Nephrology  Procedures:  Lumbar puncture on 12/24  Antibiotics:  Vancomycin 12/23>>12/27  Zosyn/Doripenem 12/23>>12/27  Fluconazole 12/27  Vancomycin by mouth 12/27  HPI/Subjective:  Fair Nauseous Not really eating 4 stools today  tolerating by mouth however Mild epigastric tenderness  Objective: Filed Vitals:   10/20/14 1428  BP: 90/57  Pulse: 98  Temp: 98.9 F (37.2 C)  Resp: 20    Intake/Output Summary (Last 24 hours) at 10/20/14 1602 Last data filed at 10/20/14 1152  Gross per 24 hour  Intake   2485 ml  Output    855 ml  Net   1630 ml   Filed Weights   10/17/14 0500 10/18/14 0500 10/20/14 0550  Weight: 71 kg (156 lb 8.4 oz) 72.4 kg (159 lb 9.8 oz) 72.3 kg (159 lb 6.3 oz)    Exam:   General:  NAD  Cardiovascular: s1, s2, tachycardic  Respiratory: CTA B  Abdomen: soft, diffusely tender, nd, bs+ epigastric tenderness noted  Musculoskeletal:  No edema b/l    Data Reviewed: Basic Metabolic Panel:  Recent Labs Lab 10/15/14 1000 10/16/14 0556  10/16/14 0650 10/16/14 1612 10/18/14 0539 10/19/14 0637 10/20/14 0658  NA 138 138  --   --  140 139 136  K 2.8* 2.5*  --  3.2* 4.3 3.8 2.9*  CL 106 111  --   --  110 107 109  CO2 23 22  --   --  25 25 22   GLUCOSE 81 126*  --   --  94 87 72  BUN 13 5*  --   --  <5* 5* <5*  CREATININE 0.49* 0.48*  --   --  0.55 0.53 0.60  CALCIUM 7.4* 7.0*  --   --  7.7* 7.3* 6.5*  MG 0.9*  --  1.4*  --   --   --   --    Liver Function Tests:  Recent Labs Lab 10/18/14 0539 10/19/14 0637 10/20/14 0658  AST 34 30 23  ALT 24 22 19   ALKPHOS 96 96 93  BILITOT 0.6 0.6 0.5  PROT 5.6* 5.3* 4.9*  ALBUMIN 1.8* 1.7* 1.6*   No results for input(s): LIPASE, AMYLASE in the last 168 hours. No results for input(s): AMMONIA in the last 168 hours. CBC:  Recent Labs Lab 10/15/14 1000 10/16/14 0556 10/18/14 0539 10/19/14 0637 10/20/14 0658  WBC 20.7* 16.1* 13.3* 11.6* 8.9  NEUTROABS  --   --  9.3* 7.6 5.5  HGB 9.1* 8.3* 8.8* 8.6* 8.0*  HCT 27.3* 25.3* 27.1* 26.4* 23.9*  MCV 87.2 88.2 88.9 88.6 87.2  PLT 254  237 299 322 329   Cardiac Enzymes: No results for input(s): CKTOTAL, CKMB, CKMBINDEX, TROPONINI in the last 168 hours. BNP (last 3 results) No results for input(s): PROBNP in the last 8760 hours. CBG: No results for input(s): GLUCAP in the last 168 hours.  Recent Results (from the past 240 hour(s))  Urine culture     Status: None   Collection Time: 10/12/14  2:50 PM  Result Value Ref Range Status   Specimen Description URINE, CATHETERIZED  Final   Special Requests NONE  Final   Culture  Setup Time   Final    10/13/2014 00:54 Performed at [place] Performed at [facility]   Final   Culture NO GROWTH Performed at [facility]   Final   Report Status 10/13/2014 FINAL  Final  Culture, blood (routine x 2)     Status: None   Collection Time: 10/12/14  3:17 PM  Result Value Ref Range Status   Specimen Description BLOOD RIGHT HAND   Final   Special Requests BOTTLES DRAWN AEROBIC ONLY 5CC  Final   Culture NO GROWTH 5 DAYS  Final   Report Status 10/17/2014 FINAL  Final  Culture, blood (routine x 2)     Status: None   Collection Time: 10/12/14  5:00 PM  Result Value Ref Range Status   Specimen Description BLOOD RIGHT HAND  Final   Special Requests BOTTLES DRAWN AEROBIC ONLY 5CC  Final   Culture NO GROWTH 5 DAYS  Final   Report Status 10/17/2014 FINAL  Final  MRSA PCR Screening     Status: None   Collection Time: 10/12/14  8:00 PM  Result Value Ref Range Status   MRSA by PCR NEGATIVE NEGATIVE Final    Comment:        The GeneXpert MRSA Assay (FDA approved for NASAL specimens only), is one component of a comprehensive MRSA colonization surveillance program. It is not intended to diagnose MRSA infection nor to guide or monitor treatment for MRSA infections.   CSF culture     Status: None   Collection Time: 10/14/14 10:50 AM  Result Value Ref Range Status   Specimen Description CSF  Final   Special Requests NONE  Final   Gram Stain   Final    WBC PRESENT, PREDOMINANTLY MONONUCLEAR NO ORGANISMS SEEN CYTOSPIN Performed at [facility] Performed at [place]   Final    NO GROWTH 3 DAYS Performed at [facility]    Report Status 10/17/2014 FINAL  Final  Clostridium Difficile by PCR     Status: Abnormal   Collection Time: 10/17/14  3:41 PM  Result Value Ref Range Status   C difficile by pcr POSITIVE (A) NEGATIVE Final    Comment: CRITICAL RESULT CALLED TO, READ BACK BY AND VERIFIED WITH: N.ROWE AT 7829 ON 10/17/14 BY S.VANHOORNE   Culture, blood (routine x 2)     Status: None (Preliminary result)   Collection Time: 10/17/14  5:01 PM  Result Value Ref Range Status   Specimen Description BLOOD LEFT ANTECUBITAL  Final   Special Requests BOTTLES DRAWN AEROBIC AND ANAEROBIC 6CC  Final   Culture NO GROWTH 3 DAYS  Final   Report Status PENDING  Incomplete  Culture, Urine      Status: None   Collection Time: 10/17/14  5:33 PM  Result Value Ref Range Status   Specimen Description URINE, CATHETERIZED  Final   Special Requests NONE  Final   Colony Count NO GROWTH Performed at [facility]   Final   Culture NO GROWTH Performed at [facility]   Final   Report Status 10/19/2014 FINAL  Final  Culture, blood (routine x 2)     Status: None (Preliminary result)   Collection Time: 10/17/14  9:09 PM  Result Value Ref Range Status   Specimen Description BLOOD LEFT HAND  Final   Special Requests BOTTLES DRAWN AEROBIC ONLY 5CC  Final   Culture NO GROWTH 3 DAYS  Final   Report Status PENDING  Incomplete     Studies: No results found.  Scheduled Meds: . Darunavir Ethanolate  800 mg Oral Q breakfast  . emtricitabine-tenofovir  1 tablet Oral Daily  . famotidine  20 mg Oral BID  . fluconazole  100 mg Oral Daily  . fluticasone  2 puff Inhalation BID  . lactobacillus acidophilus & bulgar  1 tablet Oral TID WC  . raltegravir  400 mg Oral BID  . ritonavir  100 mg Oral Q breakfast  . sodium chloride  3 mL Intravenous Q12H  . sucralfate  1 g Oral 4 times per day  . vancomycin  125 mg Oral QID  . verapamil  360 mg Oral Daily   Continuous Infusions: . dextrose 5 % and 0.9 % NaCl with KCl 40 mEq/L      Active Problems:   Human immunodeficiency virus (HIV) disease   Essential hypertension   Bipolar 1 disorder   Renal failure   Acute renal failure   Acute encephalopathy   Sepsis   Aspiration pneumonia    Time spent: 25 minutes  Verneita Griffes, MD [facility] (P) [phone]

## 2014-10-20 NOTE — Progress Notes (Signed)
ANTIBIOTIC CONSULT NOTE  Pharmacy Consult for Diflucan Indication: candida esophagitis  Allergies  Allergen Reactions  . Aspirin Nausea And Vomiting  . Sulfonamide Derivatives Hives    Patient Measurements: Height: 5\' 4"  (162.6 cm) Weight: 159 lb 6.3 oz (72.3 kg) IBW/kg (Calculated) : 54.7  Vital Signs: Temp: 99 F (37.2 C) (12/30 0550) Temp Source: Oral (12/30 0550) BP: 106/65 mmHg (12/30 0550) Pulse Rate: 107 (12/30 0550) Intake/Output from previous day: 12/29 0701 - 12/30 0700 In: 1870 [P.O.:120; I.V.:1750] Out: 855 [Urine:850; Stool:5] Intake/Output from this shift: Total I/O In: 600 [P.O.:600] Out: -   Labs:  Recent Labs  10/18/14 0539 10/19/14 0637 10/20/14 0658  WBC 13.3* 11.6* 8.9  HGB 8.8* 8.6* 8.0*  PLT 299 322 329  CREATININE 0.55 0.53 0.60   Estimated Creatinine Clearance: 81 mL/min (by C-G formula based on Cr of 0.6). No results for input(s): VANCOTROUGH, VANCOPEAK, VANCORANDOM, GENTTROUGH, GENTPEAK, GENTRANDOM, TOBRATROUGH, TOBRAPEAK, TOBRARND, AMIKACINPEAK, AMIKACINTROU, AMIKACIN in the last 72 hours.   Microbiology: Recent Results (from the past 720 hour(s))  Urine culture     Status: None   Collection Time: 10/12/14  2:50 PM  Result Value Ref Range Status   Specimen Description URINE, CATHETERIZED  Final   Special Requests NONE  Final   Culture  Setup Time   Final    10/13/2014 00:54 Performed at [place] Performed at [facility]   Final   Culture NO GROWTH Performed at [facility]   Final   Report Status 10/13/2014 FINAL  Final  Culture, blood (routine x 2)     Status: None   Collection Time: 10/12/14  3:17 PM  Result Value Ref Range Status   Specimen Description BLOOD RIGHT HAND  Final   Special Requests BOTTLES DRAWN AEROBIC ONLY 5CC  Final   Culture NO GROWTH 5 DAYS  Final   Report Status 10/17/2014 FINAL  Final  Culture, blood (routine x 2)     Status: None   Collection Time: 10/12/14  5:00 PM  Result Value Ref Range Status   Specimen Description BLOOD RIGHT HAND  Final   Special Requests BOTTLES DRAWN AEROBIC ONLY 5CC  Final   Culture NO GROWTH 5 DAYS  Final   Report Status 10/17/2014 FINAL  Final  MRSA PCR Screening     Status: None   Collection Time: 10/12/14  8:00 PM  Result Value Ref Range Status   MRSA by PCR NEGATIVE NEGATIVE Final    Comment:        The GeneXpert MRSA Assay (FDA approved for NASAL specimens only), is one component of a comprehensive MRSA colonization surveillance program. It is not intended to diagnose MRSA infection nor to guide or monitor treatment for MRSA infections.   CSF culture     Status: None   Collection Time: 10/14/14 10:50 AM  Result Value Ref Range Status   Specimen Description CSF  Final   Special Requests NONE  Final   Gram Stain   Final    WBC PRESENT, PREDOMINANTLY MONONUCLEAR NO ORGANISMS SEEN CYTOSPIN Performed at [facility] Performed at [place]   Final    NO GROWTH 3 DAYS Performed at [facility]    Report Status 10/17/2014 FINAL  Final  Clostridium Difficile by PCR     Status: Abnormal   Collection Time: 10/17/14  3:41 PM  Result Value Ref Range Status   C  difficile by pcr POSITIVE (A) NEGATIVE Final    Comment: CRITICAL RESULT CALLED TO, READ BACK BY AND VERIFIED WITH: N.ROWE AT 9563 ON 10/17/14 BY S.VANHOORNE   Culture, blood (routine x 2)     Status: None (Preliminary result)   Collection Time: 10/17/14  5:01 PM  Result Value Ref Range Status   Specimen Description BLOOD LEFT ANTECUBITAL  Final   Special Requests BOTTLES DRAWN AEROBIC AND ANAEROBIC 6CC  Final   Culture NO GROWTH 3 DAYS  Final   Report Status PENDING  Incomplete  Culture, Urine     Status: None   Collection Time: 10/17/14  5:33 PM  Result Value Ref Range Status   Specimen Description URINE, CATHETERIZED  Final   Special Requests NONE  Final   Colony Count NO  GROWTH Performed at [facility]   Final   Culture NO GROWTH Performed at [facility]   Final   Report Status 10/19/2014 FINAL  Final  Culture, blood (routine x 2)     Status: None (Preliminary result)   Collection Time: 10/17/14  9:09 PM  Result Value Ref Range Status   Specimen Description BLOOD LEFT HAND  Final   Special Requests BOTTLES DRAWN AEROBIC ONLY 5CC  Final   Culture NO GROWTH 3 DAYS  Final   Report Status PENDING  Incomplete   Anti-infectives    Start     Dose/Rate Route Frequency Ordered Stop   10/17/14 1800  vancomycin (VANCOCIN) 50 mg/mL oral solution 125 mg     125 mg Oral 4 times daily 10/17/14 1736 10/31/14 1759   10/17/14 1400  clindamycin (CLEOCIN) capsule 300 mg  Status:  Discontinued     300 mg Oral 3 times per day 10/17/14 1022 10/17/14 1130   10/17/14 1200  fluconazole (DIFLUCAN) tablet 100 mg     100 mg Oral Daily 10/17/14 1152 10/31/14 0959   10/16/14 0800  ritonavir (NORVIR) tablet 100 mg     100 mg Oral Daily with breakfast 10/15/14 1237     10/16/14 0800  Darunavir Ethanolate (PREZISTA) tablet 800 mg     800 mg Oral Daily with breakfast 10/15/14 1237     10/15/14 1245  raltegravir (ISENTRESS) tablet 400 mg     400 mg Oral 2 times daily 10/15/14 1237     10/15/14 1245  emtricitabine-tenofovir (TRUVADA) 200-300 MG per tablet 1 tablet     1 tablet Oral Daily 10/15/14 1237     10/14/14 1400  doripenem (DORIBAX) 500 mg in sodium chloride 0.9 % 100 mL IVPB  Status:  Discontinued     500 mg100 mL/hr over 60 Minutes Intravenous 3 times per day 10/14/14 1040 10/17/14 1022   10/14/14 0600  vancomycin (VANCOCIN) IVPB 750 mg/150 ml premix  Status:  Discontinued     750 mg150 mL/hr over 60 Minutes Intravenous Every 12 hours 10/13/14 1822 10/17/14 1022   10/13/14 2200  piperacillin-tazobactam (ZOSYN) IVPB 3.375 g  Status:  Discontinued     3.375 g12.5 mL/hr over 240 Minutes Intravenous Every 8 hours 10/13/14 1822 10/14/14 1039   10/13/14 1900   vancomycin (VANCOCIN) 1,250 mg in sodium chloride 0.9 % 250 mL IVPB     1,250 mg166.7 mL/hr over 90 Minutes Intravenous  Once 10/13/14 1822 10/13/14 2109   10/13/14 1230  cefTRIAXone (ROCEPHIN) 1 g in dextrose 5 % 50 mL IVPB - Premix  Status:  Discontinued     1 g100 mL/hr over 30 Minutes Intravenous Every 24  hours 10/13/14 1113 10/14/14 0820     Assessment: 72 yoF who is receiving Diflucan for candida esophagitis.  She also has hx HIV on HARRT regimen which has drug interactions with Diflucan. Pharmacist at [facility] was previously consulted for recommended Diflucan dose. Hx acute renal failure, however Scr has returned to patient's baseline.   Diflucan 12/27>>  Goal of Therapy:  Eradicate infection.  Plan:  Diflucan 100mg  po daily x 14 days as previously discussed with ID clinic & GI Dose stable Sign off  Pricilla Larsson 10/20/2014,10:31 AM

## 2014-10-20 NOTE — Progress Notes (Addendum)
250 mL Bolus ordered and administered. After completed, BP 84/56 HR 109. Notified Dr Baltazar Najjar. Waiting to hear back from Dr. Abbott Pao still A&O and asymptomatic at this time. Continuing to monitor.  Dr Baltazar Najjar called nurse and ordered to watch pt and continue to monitor BP. No new orders given at this time. Will continue to monitor frequently. Bed still in lowest position and call bell is within reach.

## 2014-10-20 NOTE — Care Management (Signed)
This is a test note

## 2014-10-20 NOTE — Progress Notes (Signed)
Physical Therapy Treatment Patient Details Name: [name] MRN: [id number] DOB: Mar 21, 1961 Today's Date: 10/20/2014    History of Present Illness This is a 53 year old lady who was sent by EMS for evaluation of vomiting. The patient herself cannot give me any clear history because she is so unwell and appears to be nonverbal. Evaluation in the emergency room with blood work showed her to be in acute renal failure with hyperkalemia and metabolic acidosis. Her blood pressure remained stable, even somewhat hypertensive. She is being admitted for treatment of her acute renal failure with a creatinine of 11.63. Previously, only a couple weeks ago her creatinine was in the normal range of 0.52. From the medical records, it appears that she has HIV and bipolar disorder as diagnoses listed. She cannot really give me any further history at the present time.    PT Comments    Pt has been moved out of ICU to the medical floor.  She appears to less lethargic today and was fully awake during my visit with her.  She appears to be poorly motivated to increase her strength and mobility but was minimally cooperative with me.  Her strength is in the 3/5 range overall today and she is able to mobilize in the bed without assist although her efforts are slow and labored.  She was able to stand at the edge of bed x2 using a walker as support, min assist to assume stance.  She was able to sidestep to the head of bed 2' with walker but she refused to try to walk or sit in a chair.  She is insistent on going home at d/c.  She does have a CAP aide 8 hours/day.  If she does go home she would benefit from having a w/c.  She apparently would prefer outpatient PT instead of HHPT but I don't think that this is very realistic considering the degree of weakness with which she has to deal.  Follow Up Recommendations   (pt refuses SNF)     Equipment Recommendations  None recommended by PT    Recommendations for Other  Services  none     Precautions / Restrictions Precautions Precautions: Fall Precaution Comments: C diff precautions, HIV + Restrictions Weight Bearing Restrictions: No    Mobility  Bed Mobility Overal bed mobility: Modified Independent             General bed mobility comments: does not use bed rails as much for mobility  Transfers Overall transfer level: Needs assistance Equipment used: Rolling walker (2 wheeled) Transfers: Sit to/from Stand Sit to Stand: Min assist         General transfer comment: able to stand at EOB  for 30 seconds and on 2nd attempt was able to sidestep to the head of bed 2' with a walker  Ambulation/Gait Ambulation/Gait assistance:  (pt will not try to walk with walker-"it's a bad day")               Stairs            Wheelchair Mobility    Modified Rankin (Stroke Patients Only)       Balance Overall balance assessment: No apparent balance deficits (not formally assessed)                                  Cognition Arousal/Alertness: Lethargic Behavior During Therapy: WFL for tasks assessed/performed Overall Cognitive Status: Within Functional Limits  for tasks assessed                      Exercises General Exercises - Lower Extremity Ankle Circles/Pumps: AROM;Both;10 reps;Supine Short Arc Quad: AROM;AAROM;Both;10 reps;Supine Heel Slides: AAROM;Both;10 reps;Supine Hip ABduction/ADduction: AAROM;Both;10 reps;Supine    General Comments        Pertinent Vitals/Pain Pain Assessment: No/denies pain    Home Living                      Prior Function            PT Goals (current goals can now be found in the care plan section) Progress towards PT goals: Progressing toward goals    Frequency       PT Plan Current plan remains appropriate    Co-evaluation             End of Session Equipment Utilized During Treatment: Gait belt Activity Tolerance: Patient limited by  fatigue Patient left: in bed;with call bell/phone within reach;with bed alarm set     Time: 1525-1559 PT Time Calculation (min) (ACUTE ONLY): 34 min  Charges:  $Therapeutic Exercise: 8-22 mins $Therapeutic Activity: 8-22 mins                    G Codes:      Sable Feil 10/21/14, 4:17 PM

## 2014-10-21 ENCOUNTER — Inpatient Hospital Stay (HOSPITAL_COMMUNITY): Payer: Medicare Other

## 2014-10-21 ENCOUNTER — Other Ambulatory Visit: Payer: Self-pay | Admitting: Licensed Clinical Social Worker

## 2014-10-21 DIAGNOSIS — B20 Human immunodeficiency virus [HIV] disease: Secondary | ICD-10-CM

## 2014-10-21 DIAGNOSIS — N179 Acute kidney failure, unspecified: Secondary | ICD-10-CM

## 2014-10-21 DIAGNOSIS — J690 Pneumonitis due to inhalation of food and vomit: Secondary | ICD-10-CM

## 2014-10-21 LAB — GLUCOSE, CAPILLARY: Glucose-Capillary: 91 mg/dL (ref 70–99)

## 2014-10-21 LAB — BLOOD GAS, ARTERIAL
Acid-base deficit: 10.8 mmol/L — ABNORMAL HIGH (ref 0.0–2.0)
Acid-base deficit: 8.3 mmol/L — ABNORMAL HIGH (ref 0.0–2.0)
Acid-base deficit: 8 mmol/L — ABNORMAL HIGH (ref 0.0–2.0)
BICARBONATE: 16.4 meq/L — AB (ref 20.0–24.0)
Bicarbonate: 13.8 mEq/L — ABNORMAL LOW (ref 20.0–24.0)
Bicarbonate: 16.1 mEq/L — ABNORMAL LOW (ref 20.0–24.0)
DRAWN BY: 25788
Drawn by: 317771
Drawn by: 41977
FIO2: 1 %
FIO2: 0.4 %
O2 Content: 3 L/min
O2 SAT: 81.4 %
O2 SAT: 90.5 %
O2 SAT: 79.7 %
PCO2 ART: 26.8 mmHg — AB (ref 35.0–45.0)
PEEP/CPAP: 5 cmH2O
PEEP: 5 cmH2O
PH ART: 7.358 (ref 7.350–7.450)
PO2 ART: 54.3 mmHg — AB (ref 80.0–100.0)
PO2 ART: 50.9 mmHg — AB (ref 80.0–100.0)
Patient temperature: 37
Patient temperature: 98.6
RATE: 16 resp/min
RATE: 14 resp/min
TCO2: 13.4 mmol/L (ref 0–100)
TCO2: 15.3 mmol/L (ref 0–100)
TCO2: 17.4 mmol/L (ref 0–100)
VT: 500 mL
VT: 450 mL
pCO2 arterial: 29.5 mmHg — ABNORMAL LOW (ref 35.0–45.0)
pCO2 arterial: 30 mmHg — ABNORMAL LOW (ref 35.0–45.0)
pH, Arterial: 7.33 — ABNORMAL LOW (ref 7.350–7.450)
pH, Arterial: 7.356 (ref 7.350–7.450)
pO2, Arterial: 66.8 mmHg — ABNORMAL LOW (ref 80.0–100.0)

## 2014-10-21 LAB — MAGNESIUM
MAGNESIUM: 2.2 mg/dL (ref 1.5–2.5)
Magnesium: 1.1 mg/dL — ABNORMAL LOW (ref 1.5–2.5)

## 2014-10-21 LAB — URINALYSIS, ROUTINE W REFLEX MICROSCOPIC
Bilirubin Urine: NEGATIVE
Glucose, UA: NEGATIVE mg/dL
Ketones, ur: NEGATIVE mg/dL
Nitrite: NEGATIVE
Protein, ur: NEGATIVE mg/dL
Specific Gravity, Urine: 1.01 (ref 1.005–1.030)
Urobilinogen, UA: 0.2 mg/dL (ref 0.0–1.0)
pH: 5 (ref 5.0–8.0)

## 2014-10-21 LAB — TROPONIN I

## 2014-10-21 LAB — PROCALCITONIN: PROCALCITONIN: 0.37 ng/mL

## 2014-10-21 LAB — LACTIC ACID, PLASMA
Lactic Acid, Venous: 2 mmol/L (ref 0.5–2.2)
Lactic Acid, Venous: 2.1 mmol/L (ref 0.5–2.2)

## 2014-10-21 LAB — URINE MICROSCOPIC-ADD ON

## 2014-10-21 LAB — POTASSIUM
POTASSIUM: 3.8 mmol/L (ref 3.5–5.1)
Potassium: 3.3 mmol/L — ABNORMAL LOW (ref 3.5–5.1)

## 2014-10-21 MED ORDER — SODIUM CHLORIDE 0.9 % IV BOLUS (SEPSIS)
750.0000 mL | Freq: Once | INTRAVENOUS | Status: AC
  Administered 2014-10-21: 750 mL via INTRAVENOUS

## 2014-10-21 MED ORDER — VANCOMYCIN 50 MG/ML ORAL SOLUTION
125.0000 mg | Freq: Four times a day (QID) | ORAL | Status: DC
  Administered 2014-10-22 – 2014-10-30 (×33): 125 mg
  Filled 2014-10-21 (×37): qty 2.5

## 2014-10-21 MED ORDER — LORAZEPAM 2 MG/ML IJ SOLN
0.5000 mg | Freq: Once | INTRAMUSCULAR | Status: AC
  Administered 2014-10-21: 0.5 mg via INTRAVENOUS
  Filled 2014-10-21: qty 1

## 2014-10-21 MED ORDER — ALBUMIN HUMAN 25 % IV SOLN
INTRAVENOUS | Status: AC
  Filled 2014-10-21: qty 100

## 2014-10-21 MED ORDER — DEXTROSE 5 % IV SOLN
30.0000 ug/min | INTRAVENOUS | Status: DC
  Administered 2014-10-21: 10 ug/min via INTRAVENOUS
  Filled 2014-10-21: qty 1

## 2014-10-21 MED ORDER — PHENYLEPHRINE HCL 10 MG/ML IJ SOLN
INTRAMUSCULAR | Status: AC
  Filled 2014-10-21: qty 1

## 2014-10-21 MED ORDER — MIDAZOLAM HCL 2 MG/2ML IJ SOLN
2.0000 mg | INTRAMUSCULAR | Status: DC | PRN
  Administered 2014-10-22 – 2014-10-23 (×8): 2 mg via INTRAVENOUS
  Filled 2014-10-21 (×8): qty 2

## 2014-10-21 MED ORDER — FENTANYL CITRATE 0.05 MG/ML IJ SOLN
INTRAMUSCULAR | Status: AC
  Filled 2014-10-21: qty 50

## 2014-10-21 MED ORDER — FENTANYL BOLUS VIA INFUSION
50.0000 ug | INTRAVENOUS | Status: DC | PRN
  Administered 2014-10-24 (×4): 50 ug via INTRAVENOUS
  Filled 2014-10-21: qty 50

## 2014-10-21 MED ORDER — FUROSEMIDE 10 MG/ML IJ SOLN: 40.0000 mg | Freq: Once | INTRAMUSCULAR | Status: DC

## 2014-10-21 MED ORDER — FENTANYL CITRATE 0.05 MG/ML IJ SOLN
INTRAMUSCULAR | Status: AC
  Filled 2014-10-21: qty 2

## 2014-10-21 MED ORDER — DIAZEPAM 5 MG PO TABS: 5.0000 mg | ORAL_TABLET | Freq: Every day | ORAL | Status: DC | PRN

## 2014-10-21 MED ORDER — NYSTATIN-TRIAMCINOLONE 100000-0.1 UNIT/GM-% EX CREA
TOPICAL_CREAM | Freq: Two times a day (BID) | CUTANEOUS | Status: DC
  Administered 2014-10-21: 13:00:00 via TOPICAL
  Administered 2014-10-22: 1 via TOPICAL
  Administered 2014-10-23: 09:00:00 via TOPICAL
  Administered 2014-10-23: 1 via TOPICAL
  Administered 2014-10-24: 22:00:00 via TOPICAL
  Administered 2014-10-24: 1 via TOPICAL
  Administered 2014-10-25 – 2014-10-29 (×10): via TOPICAL
  Administered 2014-10-30: 1 via TOPICAL
  Administered 2014-10-30 – 2014-10-31 (×3): via TOPICAL
  Administered 2014-11-01: 1 via TOPICAL
  Administered 2014-11-01 – 2014-11-05 (×9): via TOPICAL
  Filled 2014-10-21: qty 30
  Filled 2014-10-21 (×3): qty 15

## 2014-10-21 MED ORDER — CHLORHEXIDINE GLUCONATE 0.12 % MT SOLN
15.0000 mL | Freq: Two times a day (BID) | OROMUCOSAL | Status: DC
  Administered 2014-10-22 – 2014-10-26 (×9): 15 mL via OROMUCOSAL
  Filled 2014-10-21 (×6): qty 15

## 2014-10-21 MED ORDER — SODIUM CHLORIDE 0.9 % IV BOLUS (SEPSIS)
1000.0000 mL | Freq: Once | INTRAVENOUS | Status: AC
  Administered 2014-10-21: 1000 mL via INTRAVENOUS

## 2014-10-21 MED ORDER — FENTANYL CITRATE 0.05 MG/ML IJ SOLN
2.0000 ug/h | INTRAMUSCULAR | Status: DC
  Administered 2014-10-21: 50 ug/h via INTRAVENOUS
  Filled 2014-10-21: qty 50

## 2014-10-21 MED ORDER — FUROSEMIDE 10 MG/ML IJ SOLN
40.0000 mg | Freq: Once | INTRAMUSCULAR | Status: AC
  Administered 2014-10-21: 40 mg via INTRAVENOUS
  Filled 2014-10-21: qty 4

## 2014-10-21 MED ORDER — MAGNESIUM SULFATE 2 GM/50ML IV SOLN
4.0000 g | Freq: Once | INTRAVENOUS | Status: AC
  Administered 2014-10-21: 4 g via INTRAVENOUS
  Filled 2014-10-21: qty 100

## 2014-10-21 MED ORDER — SUCCINYLCHOLINE CHLORIDE 20 MG/ML IJ SOLN
INTRAMUSCULAR | Status: AC
  Filled 2014-10-21: qty 1

## 2014-10-21 MED ORDER — MIDAZOLAM HCL 2 MG/2ML IJ SOLN
INTRAMUSCULAR | Status: AC
  Filled 2014-10-21: qty 2

## 2014-10-21 MED ORDER — SODIUM BICARBONATE 8.4 % IV SOLN
INTRAVENOUS | Status: DC
  Administered 2014-10-21: 23:00:00 via INTRAVENOUS
  Filled 2014-10-21 (×5): qty 150

## 2014-10-21 MED ORDER — ALBUMIN HUMAN 25 % IV SOLN
25.0000 g | Freq: Once | INTRAVENOUS | Status: AC
  Administered 2014-10-21: 25 g via INTRAVENOUS
  Filled 2014-10-21: qty 100

## 2014-10-21 MED ORDER — SODIUM CHLORIDE 0.9 % IV SOLN
Freq: Once | INTRAVENOUS | Status: AC
  Administered 2014-10-21: 13:00:00 via INTRAVENOUS

## 2014-10-21 MED ORDER — FUROSEMIDE 10 MG/ML IJ SOLN: 40.0000 mg | Freq: Two times a day (BID) | INTRAMUSCULAR | Status: DC

## 2014-10-21 MED ORDER — PIPERACILLIN-TAZOBACTAM 3.375 G IVPB
3.3750 g | Freq: Three times a day (TID) | INTRAVENOUS | Status: DC
  Administered 2014-10-21 – 2014-10-25 (×11): 3.375 g via INTRAVENOUS
  Filled 2014-10-21 (×13): qty 50

## 2014-10-21 MED ORDER — RITONAVIR 100 MG PO TABS: 100.0000 mg | ORAL_TABLET | Freq: Every day | ORAL | Status: DC

## 2014-10-21 MED ORDER — ALBUTEROL SULFATE (2.5 MG/3ML) 0.083% IN NEBU
3.0000 mL | INHALATION_SOLUTION | RESPIRATORY_TRACT | Status: DC | PRN
  Administered 2014-10-21: 3 mL via RESPIRATORY_TRACT
  Filled 2014-10-21: qty 3

## 2014-10-21 MED ORDER — IPRATROPIUM-ALBUTEROL 0.5-2.5 (3) MG/3ML IN SOLN
3.0000 mL | RESPIRATORY_TRACT | Status: DC
  Administered 2014-10-21 – 2014-10-28 (×38): 3 mL via RESPIRATORY_TRACT
  Filled 2014-10-21 (×39): qty 3

## 2014-10-21 MED ORDER — MAGNESIUM SULFATE 4 GM/100ML IV SOLN: 4.0000 g | Freq: Once | INTRAVENOUS | Status: DC

## 2014-10-21 MED ORDER — ROCURONIUM BROMIDE 50 MG/5ML IV SOLN
INTRAVENOUS | Status: AC
  Filled 2014-10-21: qty 2

## 2014-10-21 MED ORDER — TIOTROPIUM BROMIDE MONOHYDRATE 18 MCG IN CAPS
18.0000 ug | ORAL_CAPSULE | Freq: Every day | RESPIRATORY_TRACT | Status: DC
  Filled 2014-10-21: qty 5

## 2014-10-21 MED ORDER — ETOMIDATE 2 MG/ML IV SOLN
INTRAVENOUS | Status: AC
  Filled 2014-10-21: qty 20

## 2014-10-21 MED ORDER — BECLOMETHASONE DIPROPIONATE 80 MCG/ACT IN AERS: 2.0000 | INHALATION_SPRAY | Freq: Two times a day (BID) | RESPIRATORY_TRACT | Status: DC

## 2014-10-21 MED ORDER — FLUCONAZOLE IN SODIUM CHLORIDE 200-0.9 MG/100ML-% IV SOLN
200.0000 mg | INTRAVENOUS | Status: DC
  Administered 2014-10-22 – 2014-10-27 (×6): 200 mg via INTRAVENOUS
  Filled 2014-10-21 (×6): qty 100

## 2014-10-21 MED ORDER — SODIUM CHLORIDE 0.9 % IV SOLN
25.0000 ug/h | INTRAVENOUS | Status: DC
  Administered 2014-10-24: 200 ug/h via INTRAVENOUS
  Filled 2014-10-21 (×4): qty 50

## 2014-10-21 MED ORDER — TRAMADOL HCL 50 MG PO TABS
50.0000 mg | ORAL_TABLET | Freq: Once | ORAL | Status: AC
  Administered 2014-10-21: 50 mg via ORAL
  Filled 2014-10-21: qty 1

## 2014-10-21 MED ORDER — POTASSIUM CHLORIDE CRYS ER 20 MEQ PO TBCR
40.0000 meq | EXTENDED_RELEASE_TABLET | Freq: Once | ORAL | Status: AC
  Administered 2014-10-21: 40 meq via ORAL
  Filled 2014-10-21: qty 2

## 2014-10-21 MED ORDER — LIDOCAINE HCL (CARDIAC) 20 MG/ML IV SOLN
INTRAVENOUS | Status: AC
  Filled 2014-10-21: qty 5

## 2014-10-21 MED ORDER — ENOXAPARIN SODIUM 40 MG/0.4ML ~~LOC~~ SOLN
40.0000 mg | SUBCUTANEOUS | Status: DC
  Administered 2014-10-21 – 2014-11-04 (×15): 40 mg via SUBCUTANEOUS
  Filled 2014-10-21 (×17): qty 0.4

## 2014-10-21 MED ORDER — CETYLPYRIDINIUM CHLORIDE 0.05 % MT LIQD
7.0000 mL | Freq: Four times a day (QID) | OROMUCOSAL | Status: DC
  Administered 2014-10-22 – 2014-10-26 (×20): 7 mL via OROMUCOSAL

## 2014-10-21 MED ORDER — EMTRICITABINE-TENOFOVIR DF 200-300 MG PO TABS: 1.0000 | ORAL_TABLET | Freq: Every day | ORAL | Status: DC

## 2014-10-21 MED ORDER — FAMOTIDINE IN NACL 20-0.9 MG/50ML-% IV SOLN
20.0000 mg | Freq: Two times a day (BID) | INTRAVENOUS | Status: DC
  Administered 2014-10-21 – 2014-10-26 (×10): 20 mg via INTRAVENOUS
  Filled 2014-10-21 (×11): qty 50

## 2014-10-21 NOTE — Progress Notes (Signed)
Patient continued to have increased work of breathing after a breathing treatment and 0.5mg  ativan per Dr. Arlyss Queen order.  Dr. Verlon Au made aware.  Called RT, patient placed on venti mask.  Received order for 40mg  lasix and transfer patient to ICU for bipap.  Report given to [facility].  Transferred patient to [place].

## 2014-10-21 NOTE — Progress Notes (Signed)
Patient c/o shortness of breath,b/p 93/72, heart rate 77,oxygen applied at 2 liters nasal canula saturation 92 percent with oxygen,paleness noted around patient's mouth,Dr Samtani aware,orders received,and given. Will continue to monitor patient .

## 2014-10-21 NOTE — Progress Notes (Addendum)
[facility] PROGRESS NOTE  [name] ZSM:[id number] DOB: [birth year] DOA: 10/12/2014 PCP: Barbette Merino, MD  Summary:   53 y/o female who presented to the hospital with lethargy, nausea and vomiting. Blood work in ED revealed acute renal failure with a creatinine above 11. Foley catheter was placed in ED with significant urine output and improvement in her renal failure, Post obstructive uropathy ? 2/2 oxybutynin,  MRI of the T-L spine were unrevealing. She will need outpatient follow up with urology and will likely need to be discharged with foley catheter in place. She did have a possible aspiration event, leading to sepsis and pneumonia. Her lethargy has improved and was also felt to be related to medications/sepsis. MRI of the brain and lumbar puncture were unremarkable.  She is slowly improving but then proceeded to have significant n/v.   It was noted that she was Rx recently for ? Candida esophagitis by Dr. 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 She developed diarrhea and was found to be C. Difficile positive subsequently and was started on oral vancomycin She has had a tumultuous couple of days with nausea and poor by mouth intake.   I discussed her care with Dr. Gala Romney of gastroenterology as she started having more reflux symptoms on 12/29 and 12/30 and it was elected to start her on low-dose H2 blocker as well as Protonix Unfortunately 12/30 she developed hypotension 80/50's ?  WOB, Tachypneoa and acute resp distress adn was trasnferred back to SDU as had iatrogenic vol overload +/-  Id re-consulted 12/31 pm for rec's as Lactate 2.0, PCT 0.37 She looked more tachypneic c paradoxical respiration and was fighting her Bipap and decision made to Intubate patient    Assessment/Plan:  1. Possible recurring sepsis versus hypotension-?Urinary vs Pneumonia-We will hold  any further Abx for now.  If she becomes persistentyl hypotensive or febrile overnight, she will probably benefit from Cefepime and Vanc as per Dr. Crissie Figures discussion c me 10/21/14--Blood cultures/urine culture/CSF culture from earlier admission was negative IV abx d/c 12/27-received 5 days total Vanc and Doripenem. Follow blood cultures/urine cultures performed 12/31 2. Hypotension. Patient takes clonidine, verapamil as an outpatient.   Medications were  increased on 12/30 as blood pressures were uncontrolled in the 492-010 range systolic and she may have become hypotensive from this? All blood pressure meds on hold 3. Acute renal failure. Creatinine on admission was 11.63. With placement of Foley catheter she had approximately 7.8 L of urine output within first 24 hours of admission. This would indicate a postobstructive uropathy. Renal ultrasound neg hydronephrosis or other acute findings. Continue foley catheter for now . Creatinine has now improved. Continue IV fluids. Appreciate nephrology input. 4. C. Difficile colitis-continue vancomycin by mouth 4 times a day started on 12/27 5. N/V-probable Candida esophagitis as pathology from this month showed Candida-was started on IV fluconazole 12/27 transitioned to by mouth fluconazole to complete 2 weeks-care with multiple HIV medications given risk interactions.  Underlying etiology also is reflux and I have started her on Protonix and Pepcid as discussed above on 12/30 with gastroenterologist + ID Dr. Graylon Good of infectious disease  6. Urinary retention. 2/2 medications. Case discussed with Dr. Leonel Ramsay on call for neurology. Agreed that this is likely medication related, but felt it would be appropriate to MRI her spine. Will order MRI L and T spine. Case also discussed with Dr. Gaynelle Arabian, on-call for urology. Failed voiding trial 12/26 so might  need long term foley--urine culture = from 12/27 was negative 7. Hyperkalemia. Related to #1.  Resolved. 8. Hypokalemia/hypomagnesemia. Likely related to diuresis from recovering renal failure. Replacing with IV fluid c Potassium. 9. Acute encephalopathy. Possibly related to developing sepsis, versus residual encephalopathy from renal failure. Urine drug screen appropriately positive for benzos. ABG, ammonia unremarkable. MRI of the brain neg. Xanax and oxycodone that she was taking prior to admission have been discontinued. This appears to be gradually improving--her baseline status according to her partner is that she is pretty tangential and occasionally confused-she is improved greatly since admission 10. HIV. Recent CD4 count from 06/2014 was 1330 indicating good compliance. She is on antiretroviral therapy.   Code Status: full code Family Communication: no family present- Disposition Plan: Transfer to telemetry 12/29-unclear and point, possibly discharge home with significant other?   Consultants:  Nephrology  Procedures:  Lumbar puncture on 12/24  Antibiotics:  Vancomycin 12/23>>12/27  Zosyn/Doripenem 12/23>>12/27  Fluconazole 12/27  Vancomycin by mouth 12/27  Rectal vanc  HPI/Subjective:  Events noted Short of breath Tachycardic Anxious-appearing Received multiple doses of fluid medications as well as resuscitation overnight and after aggressive fluid resuscitation this afternoon started feeling short of breath with chest x-ray indicative of volume overload. Currently diuresing now and transferred back to step down  Objective: Filed Vitals:   10/21/14 1700  BP: 105/70  Pulse: 95  Temp:   Resp: 42    Intake/Output Summary (Last 24 hours) at 10/21/14 1644 Last data filed at 10/21/14 1458  Gross per 24 hour  Intake    960 ml  Output    775 ml  Net    185 ml   Filed Weights   10/20/14 0550 10/21/14 0515 10/21/14 0556  Weight: 72.3 kg (159 lb 6.3 oz) 77 kg (169 lb 12.1 oz) 76.4 kg (168 lb 6.9 oz)    Exam:   General:  NAD  Cardiovascular: s1, s2,  tachycardic  Respiratory: CTA B  Abdomen: soft, diffusely tender, nd, bs+ epigastric tenderness noted  Musculoskeletal:  No edema b/l    Data Reviewed: Basic Metabolic Panel:  Recent Labs Lab 10/15/14 1000 10/16/14 0556 10/16/14 0650  10/18/14 0539 10/19/14 0637 10/20/14 0658 10/21/14 0139 10/21/14 0807  NA 138 138  --   --  140 139 136  --   --   K 2.8* 2.5*  --   < > 4.3 3.8 2.9* 3.3* 3.8  CL 106 111  --   --  110 107 109  --   --   CO2 23 22  --   --  25 25 22   --   --   GLUCOSE 81 126*  --   --  94 87 72  --   --   BUN 13 5*  --   --  <5* 5* <5*  --   --   CREATININE 0.49* 0.48*  --   --  0.55 0.53 0.60  --   --   CALCIUM 7.4* 7.0*  --   --  7.7* 7.3* 6.5*  --   --   MG 0.9*  --  1.4*  --   --   --   --  1.1* 2.2  < > = values in this interval not displayed. Liver Function Tests:  Recent Labs Lab 10/18/14 0539 10/19/14 0637 10/20/14 0658  AST 34 30 23  ALT 24 22 19   ALKPHOS 96 96 93  BILITOT 0.6 0.6 0.5  PROT 5.6* 5.3* 4.9*  ALBUMIN 1.8* 1.7* 1.6*   No results for input(s): LIPASE, AMYLASE in the last 168 hours. No results for input(s): AMMONIA in the last 168 hours. CBC:  Recent Labs Lab 10/15/14 1000 10/16/14 0556 10/18/14 0539 10/19/14 0637 10/20/14 0658  WBC 20.7* 16.1* 13.3* 11.6* 8.9  NEUTROABS  --   --  9.3* 7.6 5.5  HGB 9.1* 8.3* 8.8* 8.6* 8.0*  HCT 27.3* 25.3* 27.1* 26.4* 23.9*  MCV 87.2 88.2 88.9 88.6 87.2  PLT 254 237 299 322 329   Cardiac Enzymes:  Recent Labs Lab 10/21/14 1545  TROPONINI <0.03   BNP (last 3 results) No results for input(s): PROBNP in the last 8760 hours. CBG: No results for input(s): GLUCAP in the last 168 hours.  Recent Results (from the past 240 hour(s))  Urine culture     Status: None   Collection Time: 10/12/14  2:50 PM  Result Value Ref Range Status   Specimen Description URINE, CATHETERIZED  Final   Special Requests NONE  Final   Culture  Setup Time   Final    10/13/2014 00:54 Performed at  [place] Performed at [facility]   Final   Culture NO GROWTH Performed at [facility]   Final   Report Status 10/13/2014 FINAL  Final  Culture, blood (routine x 2)     Status: None   Collection Time: 10/12/14  3:17 PM  Result Value Ref Range Status   Specimen Description BLOOD RIGHT HAND  Final   Special Requests BOTTLES DRAWN AEROBIC ONLY 5CC  Final   Culture NO GROWTH 5 DAYS  Final   Report Status 10/17/2014 FINAL  Final  Culture, blood (routine x 2)     Status: None   Collection Time: 10/12/14  5:00 PM  Result Value Ref Range Status   Specimen Description BLOOD RIGHT HAND  Final   Special Requests BOTTLES DRAWN AEROBIC ONLY 5CC  Final   Culture NO GROWTH 5 DAYS  Final   Report Status 10/17/2014 FINAL  Final  MRSA PCR Screening     Status: None   Collection Time: 10/12/14  8:00 PM  Result Value Ref Range Status   MRSA by PCR NEGATIVE NEGATIVE Final    Comment:        The GeneXpert MRSA Assay (FDA approved for NASAL specimens only), is one component of a comprehensive MRSA colonization surveillance program. It is not intended to diagnose MRSA infection nor to guide or monitor treatment for MRSA infections.   CSF culture     Status: None   Collection Time: 10/14/14 10:50 AM  Result Value Ref Range Status   Specimen Description CSF  Final   Special Requests NONE  Final   Gram Stain   Final    WBC PRESENT, PREDOMINANTLY MONONUCLEAR NO ORGANISMS SEEN CYTOSPIN Performed at [facility] Performed at [place]   Final    NO GROWTH 3 DAYS Performed at [facility]    Report Status 10/17/2014 FINAL  Final  Clostridium Difficile by PCR     Status: Abnormal   Collection Time: 10/17/14  3:41 PM  Result Value Ref Range Status   C difficile by pcr POSITIVE (A) NEGATIVE Final    Comment: CRITICAL RESULT CALLED TO, READ BACK BY AND VERIFIED WITH: N.ROWE AT 1732 ON 10/17/14 BY  S.VANHOORNE   Culture, blood (routine x 2)     Status: None (Preliminary  result)   Collection Time: 10/17/14  5:01 PM  Result Value Ref Range Status   Specimen Description BLOOD LEFT ANTECUBITAL  Final   Special Requests BOTTLES DRAWN AEROBIC AND ANAEROBIC 6CC  Final   Culture NO GROWTH 4 DAYS  Final   Report Status PENDING  Incomplete  Culture, Urine     Status: None   Collection Time: 10/17/14  5:33 PM  Result Value Ref Range Status   Specimen Description URINE, CATHETERIZED  Final   Special Requests NONE  Final   Colony Count NO GROWTH Performed at [facility]   Final   Culture NO GROWTH Performed at [facility]   Final   Report Status 10/19/2014 FINAL  Final  Culture, blood (routine x 2)     Status: None (Preliminary result)   Collection Time: 10/17/14  9:09 PM  Result Value Ref Range Status   Specimen Description BLOOD LEFT HAND  Final   Special Requests BOTTLES DRAWN AEROBIC ONLY 5CC  Final   Culture NO GROWTH 4 DAYS  Final   Report Status PENDING  Incomplete     Studies: Dg Chest 1 View  10/21/2014   CLINICAL DATA:  Shortness of breath.  Pneumonia.  EXAM: CHEST - 1 VIEW  COMPARISON:  10/17/2014  FINDINGS: Very low lung volumes compared to prior. There are increasing hazy opacity at the lung bases, left greater than right. Crowding of bronchovascular structures with mixed interstitial and alveolar perihilar opacities. Cardiac silhouette is obscured and not well evaluated. No pneumothorax. Stable appearance of the osseous structures.  IMPRESSION: Very low lung volumes with increased hazy bibasilar opacities, left greater than right. Development of perihilar mixed interstitial and alveolar opacities. CHF versus progressive pneumonia or aspiration.   Electronically Signed   By: Jeb Levering M.D.   On: 10/21/2014 15:39    Scheduled Meds: . Darunavir Ethanolate  800 mg Oral Q breakfast  . emtricitabine-tenofovir  1 tablet Oral Daily  . famotidine  20 mg  Oral BID  . fluconazole  200 mg Oral Daily  . fluticasone  2 puff Inhalation BID  . lactobacillus acidophilus & bulgar  1 tablet Oral TID WC  . nystatin-triamcinolone   Topical BID  . pantoprazole  40 mg Oral Daily  . potassium chloride  40 mEq Oral BID  . raltegravir  400 mg Oral BID  . ritonavir  100 mg Oral Q breakfast  . sodium chloride  3 mL Intravenous Q12H  . tiotropium  18 mcg Inhalation Daily  . vancomycin  125 mg Oral QID   Continuous Infusions: . dextrose 5 % and 0.9 % NaCl with KCl 40 mEq/L Stopped (10/21/14 1013)    Active Problems:   Human immunodeficiency virus (HIV) disease   Essential hypertension   Bipolar 1 disorder   Renal failure   Acute renal failure   Acute encephalopathy   Sepsis   Aspiration pneumonia    CRITICAL CARE Performed by: Nita Sells   Total critical care time: 45  Critical care time was exclusive of separately billable procedures and treating other patients.  Critical care was necessary to treat or prevent imminent or life-threatening deterioration.  Critical care was time spent personally by me on the following activities: development of treatment plan with patient and/or surrogate as well as nursing, discussions with consultants, evaluation of patient's response to treatment, examination of patient, obtaining history from patient or surrogate, ordering and performing treatments and interventions, ordering and review of laboratory studies, ordering and review  of radiographic studies, pulse oximetry and re-evaluation of patient's condition.   Verneita Griffes, MD [facility] [phone]  Addend  Patient acutely decompensated form Resp standpoint while on SDU  Intubated as per separate note per Dr. Jeanell Sparrow +Femoral access also placed-greatly appreciate assitance Greatly appreciate CCM input regarding care She will be transferred to MICU 2100 under care of Dr. Geralyn Flash CRX pending  Called and tried to update significant  other/surrogate Mr Chauncey Mann on both home and cell #.  Unable to do so.    Called and updated Mr. Theda Sers, next on list no response on phone  Verneita Griffes, MD [facility] [phone]

## 2014-10-21 NOTE — Progress Notes (Signed)
ANTIBIOTIC CONSULT NOTE - INITIAL  Pharmacy Consult for Fluconazole Indication: Esophageal candidiasis  Allergies  Allergen Reactions  . Aspirin Nausea And Vomiting  . Sulfonamide Derivatives Hives    Patient Measurements: Height: 5\' 4"  (162.6 cm) Weight: 168 lb 6.9 oz (76.4 kg) IBW/kg (Calculated) : 54.7 Vital Signs: Temp: 98.9 F (37.2 C) (12/31 2206) Temp Source: Oral (12/31 2206) BP: 101/76 mmHg (12/31 2200) Pulse Rate: 92 (12/31 2200) Intake/Output from previous day: 12/30 0701 - 12/31 0700 In: 1080 [P.O.:1080] Out: 350 [Urine:350] Intake/Output from this shift: Total I/O In: 58.8 [I.V.:58.8] Out: 1225 [Urine:1225]  Labs:  Recent Labs  10/19/14 0637 10/20/14 0658  WBC 11.6* 8.9  HGB 8.6* 8.0*  PLT 322 329  CREATININE 0.53 0.60   Estimated Creatinine Clearance: 83.3 mL/min (by C-G formula based on Cr of 0.6). No results for input(s): VANCOTROUGH, VANCOPEAK, VANCORANDOM, GENTTROUGH, GENTPEAK, GENTRANDOM, TOBRATROUGH, TOBRAPEAK, TOBRARND, AMIKACINPEAK, AMIKACINTROU, AMIKACIN in the last 72 hours.   Microbiology: Recent Results (from the past 720 hour(s))  Urine culture     Status: None   Collection Time: 10/12/14  2:50 PM  Result Value Ref Range Status   Specimen Description URINE, CATHETERIZED  Final   Special Requests NONE  Final   Culture  Setup Time   Final    10/13/2014 00:54 Performed at [place] Performed at [facility]   Final   Culture NO GROWTH Performed at [facility]   Final   Report Status 10/13/2014 FINAL  Final  Culture, blood (routine x 2)     Status: None   Collection Time: 10/12/14  3:17 PM  Result Value Ref Range Status   Specimen Description BLOOD RIGHT HAND  Final   Special Requests BOTTLES DRAWN AEROBIC ONLY 5CC  Final   Culture NO GROWTH 5 DAYS  Final   Report Status 10/17/2014 FINAL  Final  Culture, blood (routine x 2)     Status: None   Collection Time: 10/12/14   5:00 PM  Result Value Ref Range Status   Specimen Description BLOOD RIGHT HAND  Final   Special Requests BOTTLES DRAWN AEROBIC ONLY 5CC  Final   Culture NO GROWTH 5 DAYS  Final   Report Status 10/17/2014 FINAL  Final  MRSA PCR Screening     Status: None   Collection Time: 10/12/14  8:00 PM  Result Value Ref Range Status   MRSA by PCR NEGATIVE NEGATIVE Final    Comment:        The GeneXpert MRSA Assay (FDA approved for NASAL specimens only), is one component of a comprehensive MRSA colonization surveillance program. It is not intended to diagnose MRSA infection nor to guide or monitor treatment for MRSA infections.   CSF culture     Status: None   Collection Time: 10/14/14 10:50 AM  Result Value Ref Range Status   Specimen Description CSF  Final   Special Requests NONE  Final   Gram Stain   Final    WBC PRESENT, PREDOMINANTLY MONONUCLEAR NO ORGANISMS SEEN CYTOSPIN Performed at [facility] Performed at [place]   Final    NO GROWTH 3 DAYS Performed at [facility]    Report Status 10/17/2014 FINAL  Final  Clostridium Difficile by PCR     Status: Abnormal   Collection Time: 10/17/14  3:41 PM  Result Value Ref Range Status   C difficile by pcr POSITIVE (A) NEGATIVE  Final    Comment: CRITICAL RESULT CALLED TO, READ BACK BY AND VERIFIED WITH: N.ROWE AT 1610 ON 10/17/14 BY S.VANHOORNE   Culture, blood (routine x 2)     Status: None (Preliminary result)   Collection Time: 10/17/14  5:01 PM  Result Value Ref Range Status   Specimen Description BLOOD LEFT ANTECUBITAL  Final   Special Requests BOTTLES DRAWN AEROBIC AND ANAEROBIC 6CC  Final   Culture NO GROWTH 4 DAYS  Final   Report Status PENDING  Incomplete  Culture, Urine     Status: None   Collection Time: 10/17/14  5:33 PM  Result Value Ref Range Status   Specimen Description URINE, CATHETERIZED  Final   Special Requests NONE  Final   Colony Count NO GROWTH Performed at [facility]   Final   Culture NO GROWTH Performed at [facility]   Final   Report Status 10/19/2014 FINAL  Final  Culture, blood (routine x 2)     Status: None (Preliminary result)   Collection Time: 10/17/14  9:09 PM  Result Value Ref Range Status   Specimen Description BLOOD LEFT HAND  Final   Special Requests BOTTLES DRAWN AEROBIC ONLY 5CC  Final   Culture NO GROWTH 4 DAYS  Final   Report Status PENDING  Incomplete    Medical History: Past Medical History  Diagnosis Date  . Baker's cyst   . Pulmonary lesion 1/03    on RUL onn CT   . Hemorrhoids   . Zoster 2001  . Family history of diabetes mellitus   . Tobacco user   . Other abnormal Papanicolaou smear of cervix and cervical HPV(795.09)   . Herpes zoster   . Hemorrhoids   . Baker's cyst   . Pancreatitis   . HIV disease     take Prezista,Truvada,Norvir,and Isentress daily  . Bipolar disorder     With hx of psychotic features.  . Articular cartilage disorder of knee   . Bursitis of shoulder   . Rotator cuff arthropathy   . Hypertension     takes Clonidine and Verapamil daily  . Emphysema   . Asthma     Albuterol resue inhaler and QVAR daily  . Shortness of breath     with exertion and stress  . Dizziness   . Joint pain   . Joint swelling   . Chronic back pain     buldging disc  . Bruises easily   . Gastric ulcer   . GERD (gastroesophageal reflux disease)     takes Nexium daily  . Chronic constipation   . Urinary frequency   . Urinary incontinence   . Nocturia   . History of blood transfusion at age 53  . Early cataracts, bilateral   . Anxiety   . Depression     takes Xanax daily and Depakote bid  . History of shingles   . 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 Tobacco abuse 10/26/2012  . Chronic back pain   . Chronic knee pain   . Complication of anesthesia     AGGITATED  . Uterine cancer     "had some kind of cancer in my teens; had to have TAH to  remove it"  . Headache(784.0)     "often here lately; weather changing"  . Pneumonia     "several times"  . Arthritis     "all over" (03/16/2014)  . Osteoarthritis of left knee 03/16/2014  . Alcohol  withdrawal delirium 03/22/2014   Assessment: 53 year old female with esophageal candidiasis to receive fluconazole. Current HIV medications (darunavir, ritonavir, Truvada, Isentress) are on hold - monitor for interactions if resumed and possible need for dose adjustment. Patient received 200mg  today at 10AM. Afebrile, wbc within normal limits. SCr at baseline.   Goal of Therapy:  Clinical resolution of infection.  Plan:  Continue fluconazole 200mg  IV daily- f/up ability to change to oral.  Monitor for drug interactions and need to reduce dose.  Request for Lovenox dose for VTE ppx - 40mg  daily.   Sloan Leiter, PharmD, BCPS Clinical Pharmacist [phone] 10/21/2014,10:26 PM

## 2014-10-21 NOTE — ED Provider Notes (Addendum)
Called to ICU to assist in intubation and line placement  INTUBATION Performed by: Krystn Dermody S  Required items: required blood products, implants, devices, and special equipment available Patient identity confirmed: provided demographic data and hospital-assigned identification number Time out: Immediately prior to procedure a "time out" was called to verify the correct patient, procedure, equipment, support staff and site/side marked as required.  Indications: respiratory distress  Intubation method: Glidescope Laryngoscopy   Preoxygenation: BVM  Sedatives: 2-Etomidate Paralytic: 125Succinylcholine  Tube Size: 8-0 cuffed  Post-procedure assessment: chest rise and ETCO2 monitor Breath sounds: equal and absent over the epigastrium Tube secured with: ETT holder Chest x-Jonerik Sliker interpreted by radiologist and me.  Chest x-Paislea Hatton findings: 80 endotracheal tube in appropriate position  Patient tolerated the procedure well with no immediate complications.  CENTRAL LINE Performed by: Shaune Pollack Consent: The procedure was performed in an emergent situation. Required items: required blood products, implants, devices, and special equipment available Patient identity confirmed: arm band and provided demographic data Time out: Immediately prior to procedure a "time out" was called to verify the correct patient, procedure, equipment, support staff and site/side marked as required. Indications: vascular access Anesthesia: local infiltration Local anesthetic: lidocaine 1% with epinephrine Anesthetic total: 3 ml Patient sedated: no Preparation: skin prepped with 2% chlorhexidine Skin prep agent dried: skin prep agent completely dried prior to procedure Sterile barriers: all five maximum sterile barriers used - cap, mask, sterile gown, sterile gloves, and large sterile sheet Hand hygiene: hand hygiene performed prior to central venous catheter insertion  Location details: right  femoral  Catheter type: triple lumen Catheter size: 8 Fr Pre-procedure: landmarks identified Ultrasound guidance: yes Successful placement: yes Post-procedure: line sutured and dressing applied Assessment: blood return through all parts, free fluid flow, placement verified by x-Maniya Donovan and no pneumothorax on x-Jaleea Alesi Patient tolerance: Patient tolerated the procedure well with no immediate complications.      Shaune Pollack, MD 10/21/14 2883  Shaune Pollack, MD 10/21/14 2001

## 2014-10-21 NOTE — Progress Notes (Signed)
Report given to [facility]. 

## 2014-10-21 NOTE — Significant Event (Signed)
Pt has femoral CVL.  Unable to contact family to get consent to change to different location for CVL >> will need to f/u with family in AM.  Significant respiratory dys-synchrony on PRVC.  Changed to pressure control with adjustment in RR, PEEP, FiO2 >> better synchrony.  Will f/u ABG and adjust vent settings further.  Chesley Mires, MD [facility] Pulmonary/Critical Care 10/21/2014, 10:42 PM Pager:  [phone] After 3pm call: [phone]

## 2014-10-21 NOTE — Progress Notes (Signed)
With [facility] - Time out - 2103

## 2014-10-21 NOTE — Clinical Social Work Note (Signed)
Patient has no further CSW needs. Patient desires to be discharged to home and have services provided in her home.   CSW signing off.  Ambrose Pancoast, LCSW

## 2014-10-21 NOTE — Progress Notes (Signed)
Called list of family members and talked to [place] Mr. Theda Sers and told him about intubation and transfer to [facility]. He stated that he will try to call rest of family. I did try to call several others but did not get any body to answer. Called report to Zacarias Pontes RN that will be getting patient and gave report. With help printed all forms for transfer.

## 2014-10-21 NOTE — Progress Notes (Signed)
BP now 80/55 and c/o leg cramps. Serum K+ and Mag level drawn and resulted in K= 3.3. Mag 1.1. Dr Arnoldo Morale notified of labs and BP. Dr ordered 25% of 25 gm Albumin now and 1 run of magnesium.  Pt has had a total of 1L Bolus this shift. Will administer both meds as soon as available. Continuing to monitor pt.

## 2014-10-21 NOTE — Progress Notes (Signed)
HIV /AIDS patient  Admitted 10/12/2014 - nause and vomit due to ATN obstructive uropathy which improved after foley. Then several days later developed C Diff - on Rx. Then yeserday dropped BP. Today worse, with worsening acute resp failure and CXR change and got intubated by [facility] EDP Dr Jeanell Sparrow. All cultures repeated.   Todays labs  PULMONARY  Recent Labs Lab 10/21/14 1539  PHART 7.330*  PCO2ART 26.8*  PO2ART 54.3*  HCO3 13.8*  TCO2 13.4  O2SAT 81.4    CBC  Recent Labs Lab 10/18/14 0539 10/19/14 0637 10/20/14 0658  HGB 8.8* 8.6* 8.0*  HCT 27.1* 26.4* 23.9*  WBC 13.3* 11.6* 8.9  PLT 299 322 329    COAGULATION No results for input(s): INR in the last 168 hours.  CARDIAC   Recent Labs Lab 10/21/14 1545  TROPONINI <0.03   No results for input(s): PROBNP in the last 168 hours.   CHEMISTRY  Recent Labs Lab 10/15/14 1000 10/16/14 0556 10/16/14 0650  10/18/14 0539 10/19/14 0637 10/20/14 0658 10/21/14 0139 10/21/14 0807  NA 138 138  --   --  140 139 136  --   --   K 2.8* 2.5*  --   < > 4.3 3.8 2.9* 3.3* 3.8  CL 106 111  --   --  110 107 109  --   --   CO2 23 22  --   --  25 25 22   --   --   GLUCOSE 81 126*  --   --  94 87 72  --   --   BUN 13 5*  --   --  <5* 5* <5*  --   --   CREATININE 0.49* 0.48*  --   --  0.55 0.53 0.60  --   --   CALCIUM 7.4* 7.0*  --   --  7.7* 7.3* 6.5*  --   --   MG 0.9*  --  1.4*  --   --   --   --  1.1* 2.2  < > = values in this interval not displayed. Estimated Creatinine Clearance: 83.3 mL/min (by C-G formula based on Cr of 0.6).   LIVER  Recent Labs Lab 10/18/14 0539 10/19/14 0637 10/20/14 0658  AST 34 30 23  ALT 24 22 19   ALKPHOS 96 96 93  BILITOT 0.6 0.6 0.5  PROT 5.6* 5.3* 4.9*  ALBUMIN 1.8* 1.7* 1.6*     INFECTIOUS  Recent Labs Lab 10/21/14 0807 10/21/14 1022  LATICACIDVEN  --  2.0  PROCALCITON 0.37  --      ENDOCRINE CBG (last 3)  No results for input(s): GLUCAP in the last 72  hours.       IMAGING x48h Dg Chest 1 View  10/21/2014   CLINICAL DATA:  Shortness of breath.  Pneumonia.  EXAM: CHEST - 1 VIEW  COMPARISON:  10/17/2014  FINDINGS: Very low lung volumes compared to prior. There are increasing hazy opacity at the lung bases, left greater than right. Crowding of bronchovascular structures with mixed interstitial and alveolar perihilar opacities. Cardiac silhouette is obscured and not well evaluated. No pneumothorax. Stable appearance of the osseous structures.  IMPRESSION: Very low lung volumes with increased hazy bibasilar opacities, left greater than right. Development of perihilar mixed interstitial and alveolar opacities. CHF versus progressive pneumonia or aspiration.   Electronically Signed   By: Jeb Levering M.D.   On: 10/21/2014 15:39    A Acute resp failure - ?? Aspiration BP -  soft aftrer intubatino  P Intubagted by EDP [facility] from [place] to [facility] Continue saline  Will need ID consult on arrival   Dr. Brand Males, M.D., [facility].C.P Pulmonary and Critical Care Medicine Staff Physician [place] Pulmonary and Critical Care Pager: [phone], If no answer or between  15:00h - 7:00h: call [phone]  10/21/2014 6:55 PM

## 2014-10-21 NOTE — Progress Notes (Signed)
Pt c/o headache unrelieved by tylenol. & BP is still low 78/56. Notified Dr Arnoldo Morale, who ordered 750 mL bolus, K+ and mag lab levels, and 1x dose of Ultram 50 mg. Administering bolus. Continuing to monitor pt frequently. Bed remains in lowest position and call bell is within reach.

## 2014-10-21 NOTE — Progress Notes (Signed)
Patient was on 300 and started having trouble breathing. Transferred to ICU. DrMarland Kitchen Verlon Au came to check on patient and about 1400 was not breathing very well. Decided to intubate, then suggestion to place centeral IV line was made and all completed about 1830. Patient tolerated well, supplies had be be brought up from ED and from Centeral line cart.

## 2014-10-21 NOTE — H&P (Addendum)
PULMONARY / CRITICAL CARE MEDICINE   Name: Assia Meanor MRN: [id number] DOB: 1961-05-21    ADMISSION DATE:  10/21/2014  REFERRING MD :  Verneita Griffes  CHIEF COMPLAINT:  Short of breath  INITIAL PRESENTATION:  53 yo female smoker admitted to [facility] with sepsis, aspiration PNA, acute encephalopathy, AKI from obstructive uropathy.  This was complicated by C diff colitis.  She has hx of HIV, Bipolar disorder, HTN, Emphysema, ETOH.  She developed VDRF and transferred to [facility].  STUDIES:  12/22 CT head >> no acute abnormalities 12/23 MRI brain >> normal for age 37/23 Renal u/s >> normal 12/24 LP ([facility]) >> protein 29, glucose 73, WBC 0, HSV DNA negative, Cryptococcal Ag negative 12/26 MRI T/L spine >> mild degenerative disease, mild spondylosis L4-L5, L5-S1  SIGNIFICANT EVENTS: 12/10 Outpt EGD >> candidal esophagitis 12/22 admit for ARF with obstructive uropathy at [facility] 12/25 Tachycardia >> tx with diltiazem 12/27 Diarrhea 12/29 Speech therapy >> normal swallow eval 12/30 hypotension 12/31 VDRF, transfer to [facility]   HISTORY OF PRESENT ILLNESS:   Pt unable to provide hx.  Hx obtained from chart.  She had EGD on 09/30/14 due to  N/V and difficulty swallowing.  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  She was sent to ER on 10/12/14 with vomiting blood and acute encephalopathy.  She was also in acute renal failure with creatinine 11.6 (baseline creatinine 0.52) and with hyperkalemia and metabolic acidosis.  She had follow catheter inserted in ER at [facility], and had almost 6 liters urine outpt as soon as catheter was placed.  She was seen by nephrology on 10/13/14 and signed off 10/14/14.  She had LP done by IR on 10/14/14.  Her mental status was improving.  She was started on Abx for sepsis and possible aspiration pneumonia.  She was eventually started on  diflucan for esophageal candidiasis.  She developed diarrhea and was found to be positive for C diff.  She was started on oral vancomycin.  She continued to improve, and was transferred out of ICU on 10/19/14.  She had speech therapy swallow evaluation which was normal.  She continued to fail voiding trials, and had foley catheter replaced.  She developed hypotension and dizziness, and was given fluid bolus and albumin.  She developed progressive dyspnea on 10/21/14.  She was treated with BiPAP and lasix, and transferred back to ICU.  Her respiratory status did not improve, and she was intubated.  She was then transferred to [facility] for further treatment.   PAST MEDICAL HISTORY :   has a past medical history of Baker's cyst; Pulmonary lesion (1/03); Hemorrhoids; Zoster (2001); Family history of diabetes mellitus; Tobacco user; Other abnormal Papanicolaou smear of cervix and cervical HPV(795.09); Herpes zoster; Hemorrhoids; Baker's cyst; Pancreatitis; HIV disease; Bipolar disorder; Articular cartilage disorder of knee; Bursitis of shoulder; Rotator cuff arthropathy; Hypertension; Emphysema; Asthma; Shortness of breath; Dizziness; Joint pain; Joint swelling; Chronic back pain; Bruises easily; Gastric ulcer; GERD (gastroesophageal reflux disease); Chronic constipation; Urinary frequency; Urinary incontinence; Nocturia; History of blood transfusion (at age 56); Early cataracts, bilateral; Anxiety; Depression; History of shingles; 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    has past surgical history that includes Breast lumpectomy (Right); Breast biopsy (  Right, ~ 1980); Cholecystectomy (10+yrs ago); Knee arthroscopy (Left); Finger surgery (Right);  epidural injections; Esophagogastroduodenoscopy; Shoulder hemi-arthroplasty (05/06/2012); Joint replacement; Fracture surgery; Appendectomy (1981); Total knee arthroplasty (Left, 03/16/2014); Total abdominal hysterectomy (1981); Esophagogastroduodenoscopy (egd) with propofol (N/A, 09/30/2014); and esophageal biopsy (09/30/2014).   Prior to Admission medications   Medication Sig Start Date End Date Taking? Authorizing Provider  albuterol (PROVENTIL HFA;VENTOLIN HFA) 108 (90 BASE) MCG/ACT inhaler Inhale 2 puffs into the lungs every 4 (four) hours as needed for shortness of breath.  12/31/11  Yes Fayrene Helper, MD  albuterol (PROVENTIL) (2.5 MG/3ML) 0.083% nebulizer solution Take 2.5 mg by nebulization every 6 (six) hours as needed for wheezing or shortness of breath.   Yes Historical Provider, MD  ALPRAZolam Duanne Moron) 1 MG tablet Take one tablet by mouth twice daily as needed for anxiety Patient taking differently: Take 1 mg by mouth 2 (two) times daily. Take one tablet by mouth twice daily as needed for anxiety 05/24/14  Yes Tiffany L Reed, DO  esomeprazole (NEXIUM) 40 MG capsule Take 40 mg by mouth daily before breakfast. 05/02/11  Yes Fayrene Helper, MD  lisinopril (PRINIVIL,ZESTRIL) 20 MG tablet Take 20 mg by mouth daily.   Yes Historical Provider, MD  lubiprostone (AMITIZA) 8 MCG capsule Take 1 capsule (8 mcg total) by mouth 2 (two) times daily with a meal. 09/22/14  Yes Mahala Menghini, PA-C  ondansetron (ZOFRAN) 4 MG tablet Take 4 mg by mouth every 8 (eight) hours as needed for nausea.   Yes Historical Provider, MD  oxybutynin (DITROPAN-XL) 10 MG 24 hr tablet Take 10 mg by mouth 2 (two) times daily.   Yes Historical Provider, MD  oxyCODONE-acetaminophen (PERCOCET) 7.5-325 MG per tablet Take 1 tablet by mouth 4 (four) times daily as needed for pain (for 30 days).  09/13/14  Yes Historical Provider, MD  risperiDONE (RISPERDAL) 1 MG tablet Take 1 mg by mouth 2 (two) times daily.   Yes Historical  Provider, MD  traMADol (ULTRAM) 50 MG tablet Take 50-100 mg by mouth every 6 (six) hours as needed for moderate pain.   Yes Historical Provider, MD  verapamil (CALAN-SR) 240 MG CR tablet Take 240 mg by mouth daily.   Yes Historical Provider, MD  zolpidem (AMBIEN) 5 MG tablet Take one tablet by mouth at bedtime as needed for sleep Patient taking differently: Take 5 mg by mouth at bedtime. Take one tablet by mouth at bedtime as needed for sleep 05/20/14  Yes Tiffany L Reed, DO  cloNIDine (CATAPRES) 0.2 MG tablet Take 0.1 mg by mouth at bedtime.    Historical Provider, MD  cyanocobalamin (,VITAMIN B-12,) 1000 MCG/ML injection Inject 1,000 mcg into the muscle every 30 (thirty) days.    Historical Provider, MD  Darunavir Ethanolate (PREZISTA) 800 MG tablet Take 800 mg by mouth daily with breakfast.    Historical Provider, MD  diazepam (VALIUM) 5 MG tablet Take 5 mg by mouth daily as needed for muscle spasms (10 day course starting on 08/16/2014).  08/16/14   Historical Provider, MD  diphenoxylate-atropine (LOMOTIL) 2.5-0.025 MG per tablet Take 1 tablet by mouth 4 (four) times daily as needed for diarrhea or loose stools. 03/23/14   Blanchie Serve, MD  emtricitabine-tenofovir (TRUVADA) 200-300 MG per tablet Take 1 tablet by mouth daily. 10/21/14   Campbell Riches, MD  methocarbamol (ROBAXIN) 750 MG tablet Take 1 tablet (750 mg total) by mouth every 6 (six) hours as needed for muscle spasms. 03/16/14   Lenetta Quaker  Mardelle Matte, MD  potassium chloride (K-DUR) 10 MEQ tablet Take 10 mEq by mouth daily.    Historical Provider, MD  promethazine (PHENERGAN) 25 MG tablet Take 25 mg by mouth every 6 (six) hours as needed for nausea.    Historical Provider, MD  QVAR 80 MCG/ACT inhaler Inhale 2 puffs into the lungs 2 (two) times daily. 09/03/14   Historical Provider, MD  raltegravir (ISENTRESS) 400 MG tablet Take 1 tablet (400 mg total) by mouth 2 (two) times daily. 08/19/14   Campbell Riches, MD  ritonavir (NORVIR) 100 MG  TABS tablet Take 1 tablet (100 mg total) by mouth daily with breakfast. 10/21/14   Campbell Riches, MD  tiotropium (SPIRIVA) 18 MCG inhalation capsule Place 18 mcg into inhaler and inhale daily.    Historical Provider, MD   Allergies  Allergen Reactions  . Aspirin Nausea And Vomiting  . Sulfonamide Derivatives Hives    FAMILY HISTORY:  indicated that her mother is deceased. She indicated that her father is deceased. She indicated that only one of her two sisters is alive. She indicated that her brother is deceased. She indicated that her maternal grandmother is deceased. She indicated that her maternal grandfather is deceased. She indicated that her paternal grandmother is deceased. She indicated that her paternal grandfather is deceased.    SOCIAL HISTORY:  reports that she has been smoking Cigarettes.  She has a 32 pack-year smoking history. She has never used smokeless tobacco. She reports that she drinks about 5.4 oz of alcohol per week. She reports that she does not use illicit drugs.  REVIEW OF SYSTEMS:  Unable to obtain  SUBJECTIVE:   VITAL SIGNS: Temp:  [97.5 F (36.4 C)-98.9 F (37.2 C)] 98.9 F (37.2 C) (12/31 2206) Pulse Rate:  [77-108] 94 (12/31 1900) Resp:  [20-49] 20 (12/31 1900) BP: (78-117)/(55-90) 117/90 mmHg (12/31 1900) SpO2:  [86 %-100 %] 96 % (12/31 1900) FiO2 (%):  [50 %-100 %] 60 % (12/31 2154) Weight:  [168 lb 6.9 oz (76.4 kg)-169 lb 12.1 oz (77 kg)] 168 lb 6.9 oz (76.4 kg) (12/31 0556) HEMODYNAMICS:   VENTILATOR SETTINGS: Vent Mode:  [-] PRVC FiO2 (%):  [50 %-100 %] 60 % Set Rate:  [16 bmp] 16 bmp Vt Set:  [450 mL-500 mL] 450 mL PEEP:  [5 cmH20] 5 cmH20 Plateau Pressure:  [20 cmH20] 20 cmH20 INTAKE / OUTPUT:  Intake/Output Summary (Last 24 hours) at 10/21/14 2210 Last data filed at 10/21/14 2030  Gross per 24 hour  Intake    720 ml  Output   2100 ml  Net  -1380 ml    PHYSICAL EXAMINATION: General: ill appearing Neuro:  Follows simple  commands HEENT:  ETT in place Cardiovascular:  Regular, tachycardic Lungs:  B/l rales Abdomen:  Soft, non tender Musculoskeletal:  No edema Skin:  No rashes  LABS:  CBC  Recent Labs Lab 10/18/14 0539 10/19/14 0637 10/20/14 0658  WBC 13.3* 11.6* 8.9  HGB 8.8* 8.6* 8.0*  HCT 27.1* 26.4* 23.9*  PLT 299 322 329   Coag's No results for input(s): APTT, INR in the last 168 hours.   BMET  Recent Labs Lab 10/18/14 0539 10/19/14 0637 10/20/14 0658 10/21/14 0139 10/21/14 0807  NA 140 139 136  --   --   K 4.3 3.8 2.9* 3.3* 3.8  CL 110 107 109  --   --   CO2 25 25 22   --   --   BUN <5* 5* <5*  --   --  CREATININE 0.55 0.53 0.60  --   --   GLUCOSE 94 87 72  --   --    Electrolytes  Recent Labs Lab 10/16/14 0650 10/18/14 0539 10/19/14 0637 10/20/14 0658 10/21/14 0139 10/21/14 0807  CALCIUM  --  7.7* 7.3* 6.5*  --   --   MG 1.4*  --   --   --  1.1* 2.2   Sepsis Markers  Recent Labs Lab 10/21/14 0807 10/21/14 1022  LATICACIDVEN  --  2.0  PROCALCITON 0.37  --    ABG  Recent Labs Lab 10/21/14 1539 10/21/14 1801  PHART 7.330* 7.356  PCO2ART 26.8* 29.5*  PO2ART 54.3* 66.8*   Liver Enzymes  Recent Labs Lab 10/18/14 0539 10/19/14 0637 10/20/14 0658  AST 34 30 23  ALT 24 22 19   ALKPHOS 96 96 93  BILITOT 0.6 0.6 0.5  ALBUMIN 1.8* 1.7* 1.6*   Cardiac Enzymes  Recent Labs Lab 10/21/14 1545  TROPONINI <0.03   Glucose  Recent Labs Lab 10/21/14 2203  GLUCAP 91    Imaging Dg Chest 1 View  10/21/2014   CLINICAL DATA:  Follow-up endotracheal tube placement.  EXAM: CHEST - 1 VIEW  COMPARISON:  Prior radiograph performed earlier on the same day.  FINDINGS: Endotracheal tube in place with tip located approximately 3.2 cm above the carina.  Severe cardiomegaly again seen. Bibasilar airspace opacities are slightly worsened, favored to reflect progressive pulmonary edema. Rapidly progressive in infection could also be considered. Right pleural  effusion with probable left pleural effusion present. No pneumothorax.  Osseous structures unchanged. Right shoulder arthroplasty partially visualized.  IMPRESSION: 1. Tip of the endotracheal tube 3.2 cm above the carina. 2. Interval worsening and bilateral airspace disease, favored to reflect progressive edema, although rapidly progressive pneumonia could also be considered. 3. Bilateral pleural effusions.   Electronically Signed   By: Jeannine Boga M.D.   On: 10/21/2014 19:40   Dg Chest 1 View  10/21/2014   CLINICAL DATA:  Shortness of breath.  Pneumonia.  EXAM: CHEST - 1 VIEW  COMPARISON:  10/17/2014  FINDINGS: Very low lung volumes compared to prior. There are increasing hazy opacity at the lung bases, left greater than right. Crowding of bronchovascular structures with mixed interstitial and alveolar perihilar opacities. Cardiac silhouette is obscured and not well evaluated. No pneumothorax. Stable appearance of the osseous structures.  IMPRESSION: Very low lung volumes with increased hazy bibasilar opacities, left greater than right. Development of perihilar mixed interstitial and alveolar opacities. CHF versus progressive pneumonia or aspiration.   Electronically Signed   By: Jeb Levering M.D.   On: 10/21/2014 15:39     ASSESSMENT / PLAN:  PULMONARY ETT 12/31 >> A: Acute respiratory failure 2nd to aspiration PNA. Hx of emphysema. Continued tobacco abuse. P:   Full vent support Follow CXR, ABG Scheduled BD's Hold ICS for now D/c spiriva in setting of urinary retention  CARDIOVASCULAR Rt femoral CVL ([facility]) 12/31 >> A:  Severe sepsis. Hx of HTN. P:  Monitor hemodynamics F/u lactic acid, procalcitonin Check Echo  RENAL A:   AKI 2nd to obstructive uropathy >> improved with foley catheter insertion. Metabolic acidosis. Hypokalemia, hypomagnesemia. P:   Continue foley catheter Monitor renal fx, urine outpt F/u and replace electrolytes as needed Add HCO3 to IV  fluid  GASTROINTESTINAL A:   Esophageal candidiasis. Diarrhea due to C diff colitis. P:   NPO Pepcid for SUP Tube feeds if unable to extubate soon  HEMATOLOGIC A:   Anemia  of critical illness and chronic disease. P:  F/u CBC Transfuse for Hb < 7 Lovenox for DVT prevention  INFECTIOUS A:   Severe sepsis. C diff colitis. Aspiration PNA. Esophageal candidiasis. Possible UTI. Hx of HIV. P:   Vancomycin, Doripenem 12/24 >> 12/27 Diflucan 12/27 >> Oral Vancomycin 12/27 >> Zosyn 12/31 >> Consult ID 10/22/14 >> hold antiretroviral meds for now  Blood 12/22 >> negative Urine 12/23 >> negative CSF 12/24 >> negative C diff PCR 12/27 >> POSITIVE Urine 12/27 >> negative Blood 12/27 >>  Blood 12/31 >> Urine 12/31 >> Sputum 12/31 >>  ENDOCRINE A:   No acute issues. P:   Monitor blood sugar on BMET  NEUROLOGIC A:   Acute encephalopathy 2nd to respiratory failure, sepsis, AKI. Hx of Bipolar disorder. P:   RASS goal: -1 PAD 2 sedation protocol  No family at bedside 12/31.  Inter-disciplinary family meet or Palliative Care meeting due by 10/28/14:   CC time 85 minutes.  Chesley Mires, MD [facility] Pulmonary/Critical Care 10/21/2014, 10:25 PM Pager:  [phone] After 3pm call: [phone]

## 2014-10-22 ENCOUNTER — Inpatient Hospital Stay (HOSPITAL_COMMUNITY): Payer: Medicare Other

## 2014-10-22 DIAGNOSIS — A419 Sepsis, unspecified organism: Principal | ICD-10-CM

## 2014-10-22 DIAGNOSIS — B3781 Candidal esophagitis: Secondary | ICD-10-CM

## 2014-10-22 DIAGNOSIS — N19 Unspecified kidney failure: Secondary | ICD-10-CM

## 2014-10-22 DIAGNOSIS — D72829 Elevated white blood cell count, unspecified: Secondary | ICD-10-CM

## 2014-10-22 DIAGNOSIS — J811 Chronic pulmonary edema: Secondary | ICD-10-CM

## 2014-10-22 DIAGNOSIS — N139 Obstructive and reflux uropathy, unspecified: Secondary | ICD-10-CM

## 2014-10-22 DIAGNOSIS — I369 Nonrheumatic tricuspid valve disorder, unspecified: Secondary | ICD-10-CM

## 2014-10-22 LAB — DIFFERENTIAL
BASOS ABS: 0 10*3/uL (ref 0.0–0.1)
Basophils Relative: 0 % (ref 0–1)
Eosinophils Absolute: 0.3 10*3/uL (ref 0.0–0.7)
Eosinophils Relative: 1 % (ref 0–5)
LYMPHS PCT: 10 % — AB (ref 12–46)
Lymphs Abs: 3 10*3/uL (ref 0.7–4.0)
MONO ABS: 0.6 10*3/uL (ref 0.1–1.0)
Monocytes Relative: 2 % — ABNORMAL LOW (ref 3–12)
Neutro Abs: 26.3 10*3/uL — ABNORMAL HIGH (ref 1.7–7.7)
Neutrophils Relative %: 87 % — ABNORMAL HIGH (ref 43–77)

## 2014-10-22 LAB — COMPREHENSIVE METABOLIC PANEL
ALBUMIN: 1.8 g/dL — AB (ref 3.5–5.2)
ALK PHOS: 100 U/L (ref 39–117)
ALT: 17 U/L (ref 0–35)
AST: 27 U/L (ref 0–37)
Anion gap: 5 (ref 5–15)
BUN: <5 mg/dL — ABNORMAL LOW (ref 6–23)
CO2: 19 mmol/L (ref 19–32)
CREATININE: 0.77 mg/dL (ref 0.50–1.10)
Calcium: 7.4 mg/dL — ABNORMAL LOW (ref 8.4–10.5)
Chloride: 112 mEq/L (ref 96–112)
GFR calc Af Amer: >90 mL/min (ref >90)
Glucose, Bld: 114 mg/dL — ABNORMAL HIGH (ref 70–99)
Potassium: 4.5 mmol/L (ref 3.5–5.1)
Sodium: 136 mmol/L (ref 135–145)
Total Bilirubin: 0.4 mg/dL (ref 0.3–1.2)
Total Protein: 5.1 g/dL — ABNORMAL LOW (ref 6.0–8.3)

## 2014-10-22 LAB — CBC
HEMATOCRIT: 21.9 % — AB (ref 36.0–46.0)
Hemoglobin: 7.4 g/dL — ABNORMAL LOW (ref 12.0–15.0)
MCH: 29.5 pg (ref 26.0–34.0)
MCHC: 33.8 g/dL (ref 30.0–36.0)
MCV: 87.3 fL (ref 78.0–100.0)
PLATELETS: 360 10*3/uL (ref 150–400)
RBC: 2.51 MIL/uL — ABNORMAL LOW (ref 3.87–5.11)
RDW: 14.2 % (ref 11.5–15.5)
WBC: 30.2 10*3/uL — AB (ref 4.0–10.5)

## 2014-10-22 LAB — POCT I-STAT 3, ART BLOOD GAS (G3+)
ACID-BASE DEFICIT: 9 mmol/L — AB (ref 0.0–2.0)
BICARBONATE: 15.4 meq/L — AB (ref 20.0–24.0)
O2 Saturation: 92 %
TCO2: 16 mmol/L (ref 0–100)
pCO2 arterial: 28.9 mmHg — ABNORMAL LOW (ref 35.0–45.0)
pH, Arterial: 7.335 — ABNORMAL LOW (ref 7.350–7.450)
pO2, Arterial: 67 mmHg — ABNORMAL LOW (ref 80.0–100.0)

## 2014-10-22 LAB — GLUCOSE, CAPILLARY: GLUCOSE-CAPILLARY: 92 mg/dL (ref 70–99)

## 2014-10-22 LAB — BASIC METABOLIC PANEL
ANION GAP: 7 (ref 5–15)
CO2: 19 mmol/L (ref 19–32)
Calcium: 7.7 mg/dL — ABNORMAL LOW (ref 8.4–10.5)
Chloride: 110 mEq/L (ref 96–112)
Creatinine, Ser: 0.73 mg/dL (ref 0.50–1.10)
GFR calc Af Amer: >90 mL/min (ref >90)
Glucose, Bld: 113 mg/dL — ABNORMAL HIGH (ref 70–99)
Potassium: 4.2 mmol/L (ref 3.5–5.1)
SODIUM: 136 mmol/L (ref 135–145)

## 2014-10-22 LAB — LACTIC ACID, PLASMA: LACTIC ACID, VENOUS: 2 mmol/L (ref 0.5–2.2)

## 2014-10-22 LAB — MAGNESIUM: MAGNESIUM: 1.5 mg/dL (ref 1.5–2.5)

## 2014-10-22 LAB — PROCALCITONIN
PROCALCITONIN: 0.38 ng/mL
PROCALCITONIN: 0.6 ng/mL

## 2014-10-22 LAB — PHOSPHORUS: Phosphorus: 3.6 mg/dL (ref 2.3–4.6)

## 2014-10-22 MED ORDER — MAGNESIUM SULFATE 2 GM/50ML IV SOLN
2.0000 g | Freq: Once | INTRAVENOUS | Status: AC
  Administered 2014-10-22: 2 g via INTRAVENOUS
  Filled 2014-10-22: qty 50

## 2014-10-22 MED ORDER — DIPHENHYDRAMINE HCL 50 MG/ML IJ SOLN
12.5000 mg | Freq: Once | INTRAMUSCULAR | Status: AC
Start: 2014-10-22 — End: 2014-10-22
  Administered 2014-10-22: 12.5 mg via INTRAVENOUS
  Filled 2014-10-22: qty 1

## 2014-10-22 MED ORDER — FUROSEMIDE 10 MG/ML IJ SOLN
40.0000 mg | Freq: Once | INTRAMUSCULAR | Status: AC
Start: 2014-10-22 — End: 2014-10-22
  Administered 2014-10-22: 40 mg via INTRAVENOUS
  Filled 2014-10-22: qty 4

## 2014-10-22 NOTE — Consult Note (Signed)
[facility]  Total days of antibiotics 10        Day 6 oral vanco        Day 2 piptazo        Day 6 fluconazole       Reason for Consult: respiratory distress, leukocytosis/ hiv    Referring Physician: ramaswamy  Active Problems:   Human immunodeficiency virus (HIV) disease   Essential hypertension   Bipolar 1 disorder   Acute respiratory failure with hypoxia   Renal failure   Acute renal failure   Acute encephalopathy   Sepsis   Aspiration pneumonia    HPI: [name] is a 54 y.o. female with HIV disease, CD 4 count of 1330/VL 37, on  raltegravir-truvada-boosted darunavir regimen. Last seen in ID clinic on 10/27 with Dr. Johnnye Sima. Complained of dysphagia but no oral thrush on exam. She was referred to GI on 12/10 underwent EGD that found Esophageal pseudodiverticula present. Multiple pustular esophageal lesions-status post biopsy.large hiatal hernia. Abnormal gastric mucosa was uncertain significance. Path showed fungal hyphae c/w candidal esophagitis. On 12/17 started on fluc 148m x 14d. She went to the ED on 12/22,  For protracted vomiting.  blood work revealed acute renal failure (cr 11.63 up from BL of 0.53) with hyperkalemia (K+ 7.9) and metabolic acidosis, AG of 19. Her blood pressure remained stable, even somewhat hypertensive.  a Foley catheter was inserted into the bladder and copious amounts of urine has been obtained, 7.8 L in 24hr, thus thought this was obstructive uropathy, and her repeat BMP showed cr on 1.3, 24hrs later. The etiology somewhat unclear of why she had obstructive uropathy,but thought to be due to recently starting ditropna. Renal u/s did not reveal hydronephrosis, mri t/l spine did not show pathology.  She had received 1 dose of vanco and piptazo on admit in ED, then changed to vancomycin and doripenem due to fever, and leukocytosis of 21K, coverage for possible aspiration pneumonia. Urine, blood, csf cx were are negative. However  on 12/27 ,she started to have episodes of n/v and loose stools, cdiff pcr sent +. Oral vancomycin started on 12/27. Iv Vanco and doripene stopped, roughly 5days of broad spectrum antibiotics. She was having response to oral vancomycin with less BM and leukocytosis improving. On evening of 12/30 had episodes of hypotension, with sBP 80s, o2 sat 94%. She received 2L bolus overnight with not much improvement and throughout the day worsened with respiratory distress. CXR suggested pulmonary edema. She was intubated on 12/31 evening [facility]. She was not febrile, no diarrhea, but labs reveal wbc 30 increased from 9 ( on 12/30), LA 2, procalcitonin 0.6. Started on piptazo for empiric coverage and repeat infectious work up    Past Medical History  Diagnosis Date  . Baker's cyst   . Pulmonary lesion 1/03    on RUL onn CT   . Hemorrhoids   . Zoster 2001  . Family history of diabetes mellitus   . Tobacco user   . Other abnormal Papanicolaou smear of cervix and cervical HPV(795.09)   . Herpes zoster   . Hemorrhoids   . Baker's cyst   . Pancreatitis   . HIV disease     take Prezista,Truvada,Norvir,and Isentress daily  . Bipolar disorder     With hx of psychotic features.  . Articular cartilage disorder of knee   . Bursitis of shoulder   . Rotator cuff arthropathy   . Hypertension  takes Clonidine and Verapamil daily  . Emphysema   . Asthma     Albuterol resue inhaler and QVAR daily  . Shortness of breath     with exertion and stress  . Dizziness   . Joint pain   . Joint swelling   . Chronic back pain     buldging disc  . Bruises easily   . Gastric ulcer   . GERD (gastroesophageal reflux disease)     takes Nexium daily  . Chronic constipation   . Urinary frequency   . Urinary incontinence   . Nocturia   . History of blood transfusion at age 61  . Early cataracts, bilateral   . Anxiety   . Depression     takes Xanax daily and Depakote bid  .  History of shingles   . 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 Tobacco abuse 10/26/2012  . Chronic back pain   . Chronic knee pain   . Complication of anesthesia     AGGITATED  . Uterine cancer     "had some kind of cancer in my teens; had to have TAH to remove it"  . Headache(784.0)     "often here lately; weather changing"  . Pneumonia     "several times"  . Arthritis     "all over" (03/16/2014)  . Osteoarthritis of left knee 03/16/2014  . Alcohol withdrawal delirium 03/22/2014    Allergies:  Allergies  Allergen Reactions  . Aspirin Nausea And Vomiting  . Sulfonamide Derivatives Hives     MEDICATIONS: . antiseptic oral rinse  7 mL Mouth Rinse QID  . chlorhexidine  15 mL Mouth Rinse BID  . enoxaparin (LOVENOX) injection  40 mg Subcutaneous Q24H  . famotidine (PEPCID) IV  20 mg Intravenous Q12H  . fluconazole (DIFLUCAN) IV  200 mg Intravenous Q24H  . ipratropium-albuterol  3 mL Nebulization Q4H  . nystatin-triamcinolone   Topical BID  . piperacillin-tazobactam (ZOSYN)  IV  3.375 g Intravenous 3 times per day  . vancomycin  125 mg Per Tube QID    History  Substance Use Topics  . Smoking status: Current Every Day Smoker -- 1.00 packs/day for 32 years    Types: Cigarettes  . Smokeless tobacco: Never Used  . Alcohol Use: 5.4 oz/week    9 Shots of liquor per week     Comment: 03/16/2014 "I drink brandy; 2-3 shots/day; a few days/wk"    Family History  Problem Relation Age of Onset  . Diabetes      Family History  . Hypertension      Family history   . Hypertension Mother   . Diabetes Mother   . Heart failure Mother   . Diabetes Father   . Cancer Sister 74    x2 (liver)  . Diabetes Brother   . Colon cancer Neg Hx   . Pancreatic disease Neg Hx     Review of Systems -  Unable to obtain due to sedation and intubation  OBJECTIVE: Temp:  [98.6 F (37 C)-100 F (37.8 C)] 99.1 F (37.3 C) (01/01 0817) Pulse Rate:  [77-109]  109 (01/01 1115) Resp:  [17-49] 22 (01/01 1115) BP: (88-117)/(59-90) 92/66 mmHg (01/01 1115) SpO2:  [86 %-100 %] 95 % (01/01 1115) FiO2 (%):  [50 %-100 %] 60 % (01/01 1115) Physical Exam  Constitutional:  Opens eyes to verbal stimuli. appears well-developed and well-nourished. No distress.  HENT: OETT in place, Mouth/Throat: Oropharynx is clear and moist.  No oropharyngeal exudate.  Cardiovascular: Normal rate, regular rhythm and normal heart sounds. Exam reveals no gallop and no friction rub.  No murmur heard.  Pulmonary/Chest: Effort normal and breath sounds normal. No respiratory distress.  has no wheezes.  Abdominal: Soft. Bowel sounds are normal.  exhibits no distension. There is no tenderness.  Lymphadenopathy: no cervical adenopathy.  Neurological: alert and oriented to person, place, and time.  Skin: Skin is warm and dry. No rash noted. No erythema.    LABS: Results for orders placed or performed during the hospital encounter of 10/12/14 (from the past 48 hour(s))  Potassium     Status: Abnormal   Collection Time: 10/21/14  1:39 AM  Result Value Ref Range   Potassium 3.3 (L) 3.5 - 5.1 mmol/L    Comment: Please note change in reference range.  Magnesium     Status: Abnormal   Collection Time: 10/21/14  1:39 AM  Result Value Ref Range   Magnesium 1.1 (L) 1.5 - 2.5 mg/dL  Potassium     Status: None   Collection Time: 10/21/14  8:07 AM  Result Value Ref Range   Potassium 3.8 3.5 - 5.1 mmol/L    Comment: Please note change in reference range.  Magnesium     Status: None   Collection Time: 10/21/14  8:07 AM  Result Value Ref Range   Magnesium 2.2 1.5 - 2.5 mg/dL  Procalcitonin - Baseline     Status: None   Collection Time: 10/21/14  8:07 AM  Result Value Ref Range   Procalcitonin 0.37 ng/mL    Comment:        Interpretation: PCT (Procalcitonin) <= 0.5 ng/mL: Systemic infection (sepsis) is not likely. Local bacterial infection is possible. (NOTE)         ICU PCT  Algorithm               Non ICU PCT Algorithm    ----------------------------     ------------------------------         PCT < 0.25 ng/mL                 PCT < 0.1 ng/mL     Stopping of antibiotics            Stopping of antibiotics       strongly encouraged.               strongly encouraged.    ----------------------------     ------------------------------       PCT level decrease by               PCT < 0.25 ng/mL       >= 80% from peak PCT       OR PCT 0.25 - 0.5 ng/mL          Stopping of antibiotics                                             encouraged.     Stopping of antibiotics           encouraged.    ----------------------------     ------------------------------       PCT level decrease by              PCT >= 0.25 ng/mL       < 80% from peak PCT  AND PCT >= 0.5 ng/mL            Continuin g antibiotics                                              encouraged.       Continuing antibiotics            encouraged.    ----------------------------     ------------------------------     PCT level increase compared          PCT > 0.5 ng/mL         with peak PCT AND          PCT >= 0.5 ng/mL             Escalation of antibiotics                                          strongly encouraged.      Escalation of antibiotics        strongly encouraged.   Lactic acid, plasma     Status: None   Collection Time: 10/21/14 10:22 AM  Result Value Ref Range   Lactic Acid, Venous 2.0 0.5 - 2.2 mmol/L  Blood gas, arterial     Status: Abnormal   Collection Time: 10/21/14  3:39 PM  Result Value Ref Range   O2 Content 3.0 L/min   pH, Arterial 7.330 (L) 7.350 - 7.450   pCO2 arterial 26.8 (L) 35.0 - 45.0 mmHg   pO2, Arterial 54.3 (L) 80.0 - 100.0 mmHg   Bicarbonate 13.8 (L) 20.0 - 24.0 mEq/L   TCO2 13.4 0 - 100 mmol/L   Acid-base deficit 10.8 (H) 0.0 - 2.0 mmol/L   O2 Saturation 81.4 %   Collection site LEFT RADIAL    Drawn by [id number]    Sample type ARTERIAL    Allens test  (pass/fail) PASS PASS  Troponin I     Status: None   Collection Time: 10/21/14  3:45 PM  Result Value Ref Range   Troponin I <0.03 <0.031 ng/mL    Comment:        NO INDICATION OF MYOCARDIAL INJURY. Please note change in reference range.   Urinalysis, Routine w reflex microscopic     Status: Abnormal   Collection Time: 10/21/14  5:33 PM  Result Value Ref Range   Color, Urine STRAW (A) YELLOW   APPearance HAZY (A) CLEAR   Specific Gravity, Urine 1.010 1.005 - 1.030   pH 5.0 5.0 - 8.0   Glucose, UA NEGATIVE NEGATIVE mg/dL   Hgb urine dipstick MODERATE (A) NEGATIVE   Bilirubin Urine NEGATIVE NEGATIVE   Ketones, ur NEGATIVE NEGATIVE mg/dL   Protein, ur NEGATIVE NEGATIVE mg/dL   Urobilinogen, UA 0.2 0.0 - 1.0 mg/dL   Nitrite NEGATIVE NEGATIVE   Leukocytes, UA LARGE (A) NEGATIVE  Urine microscopic-add on     Status: Abnormal   Collection Time: 10/21/14  5:33 PM  Result Value Ref Range   Squamous Epithelial / LPF FEW (A) RARE   WBC, UA 21-50 <3 WBC/hpf   RBC / HPF 3-6 <3 RBC/hpf   Bacteria, UA MANY (A) RARE  Blood gas, arterial     Status: Abnormal   Collection Time: 10/21/14  6:01 PM  Result Value Ref Range   FIO2 1.00 %   Delivery systems VENTILATOR    Mode PRESSURE REGULATED VOLUME CONTROL    VT 500 mL   Rate 16.0 resp/min   Peep/cpap 5.0 cm H20   pH, Arterial 7.356 7.350 - 7.450   pCO2 arterial 29.5 (L) 35.0 - 45.0 mmHg   pO2, Arterial 66.8 (L) 80.0 - 100.0 mmHg   Bicarbonate 16.1 (L) 20.0 - 24.0 mEq/L   TCO2 15.3 0 - 100 mmol/L   Acid-base deficit 8.3 (H) 0.0 - 2.0 mmol/L   O2 Saturation 90.5 %   Patient temperature 37.0    Collection site LEFT RADIAL    Drawn by [id number]    Sample type ARTERIAL DRAW    Allens test (pass/fail) PASS PASS  Glucose, capillary     Status: None   Collection Time: 10/21/14 10:03 PM  Result Value Ref Range   Glucose-Capillary 91 70 - 99 mg/dL  Blood gas, arterial     Status: Abnormal   Collection Time: 10/21/14 10:28 PM  Result Value  Ref Range   FIO2 0.40 %   Delivery systems VENTILATOR    Mode PRESSURE REGULATED VOLUME CONTROL    VT 450 mL   Rate 14 resp/min   Peep/cpap 5.0 cm H20   pH, Arterial 7.358 7.350 - 7.450   pCO2 arterial 30.0 (L) 35.0 - 45.0 mmHg   pO2, Arterial 50.9 (L) 80.0 - 100.0 mmHg   Bicarbonate 16.4 (L) 20.0 - 24.0 mEq/L   TCO2 17.4 0 - 100 mmol/L   Acid-base deficit 8.0 (H) 0.0 - 2.0 mmol/L   O2 Saturation 79.7 %   Patient temperature 98.6    Collection site RIGHT RADIAL    Drawn by [phone]    Sample type ARTERIAL DRAW    Allens test (pass/fail) PASS PASS  Lactic acid, plasma     Status: None   Collection Time: 10/21/14 10:30 PM  Result Value Ref Range   Lactic Acid, Venous 2.1 0.5 - 2.2 mmol/L  Comprehensive metabolic panel     Status: Abnormal   Collection Time: 10/21/14 11:00 PM  Result Value Ref Range   Sodium 136 135 - 145 mmol/L    Comment: Please note change in reference range.   Potassium 4.5 3.5 - 5.1 mmol/L    Comment: Please note change in reference range.   Chloride 112 96 - 112 mEq/L   CO2 19 19 - 32 mmol/L   Glucose, Bld 114 (H) 70 - 99 mg/dL   BUN <5 (L) 6 - 23 mg/dL    Comment: REPEATED TO VERIFY   Creatinine, Ser 0.77 0.50 - 1.10 mg/dL   Calcium 7.4 (L) 8.4 - 10.5 mg/dL   Total Protein 5.1 (L) 6.0 - 8.3 g/dL   Albumin 1.8 (L) 3.5 - 5.2 g/dL   AST 27 0 - 37 U/L   ALT 17 0 - 35 U/L   Alkaline Phosphatase 100 39 - 117 U/L   Total Bilirubin 0.4 0.3 - 1.2 mg/dL   GFR calc non Af Amer >90 >90 mL/min   GFR calc Af Amer >90 >90 mL/min    Comment: (NOTE) The eGFR has been calculated using the CKD EPI equation. This calculation has not been validated in all clinical situations. eGFR's persistently <90 mL/min signify possible Chronic Kidney Disease.    Anion gap 5 5 - 15  I-STAT 3, arterial blood gas (G3+)     Status: Abnormal   Collection Time: 10/22/14  1:15 AM  Result Value Ref Range   pH, Arterial 7.335 (L) 7.350 - 7.450   pCO2 arterial 28.9 (L) 35.0 - 45.0 mmHg     pO2, Arterial 67.0 (L) 80.0 - 100.0 mmHg   Bicarbonate 15.4 (L) 20.0 - 24.0 mEq/L   TCO2 16 0 - 100 mmol/L   O2 Saturation 92.0 %   Acid-base deficit 9.0 (H) 0.0 - 2.0 mmol/L   Sample type ARTERIAL   Lactic acid, plasma     Status: None   Collection Time: 10/22/14  5:00 AM  Result Value Ref Range   Lactic Acid, Venous 2.0 0.5 - 2.2 mmol/L  Magnesium     Status: None   Collection Time: 10/22/14  5:00 AM  Result Value Ref Range   Magnesium 1.5 1.5 - 2.5 mg/dL  Phosphorus     Status: None   Collection Time: 10/22/14  5:00 AM  Result Value Ref Range   Phosphorus 3.6 2.3 - 4.6 mg/dL  Basic metabolic panel     Status: Abnormal   Collection Time: 10/22/14  5:00 AM  Result Value Ref Range   Sodium 136 135 - 145 mmol/L    Comment: Please note change in reference range.   Potassium 4.2 3.5 - 5.1 mmol/L    Comment: Please note change in reference range.   Chloride 110 96 - 112 mEq/L   CO2 19 19 - 32 mmol/L   Glucose, Bld 113 (H) 70 - 99 mg/dL   BUN <5 (L) 6 - 23 mg/dL    Comment: CONSISTENT WITH PREVIOUS RESULT   Creatinine, Ser 0.73 0.50 - 1.10 mg/dL   Calcium 7.7 (L) 8.4 - 10.5 mg/dL   GFR calc non Af Amer >90 >90 mL/min   GFR calc Af Amer >90 >90 mL/min    Comment: (NOTE) The eGFR has been calculated using the CKD EPI equation. This calculation has not been validated in all clinical situations. eGFR's persistently <90 mL/min signify possible Chronic Kidney Disease.    Anion gap 7 5 - 15  CBC     Status: Abnormal   Collection Time: 10/22/14  5:00 AM  Result Value Ref Range   WBC 30.2 (H) 4.0 - 10.5 K/uL   RBC 2.51 (L) 3.87 - 5.11 MIL/uL   Hemoglobin 7.4 (L) 12.0 - 15.0 g/dL   HCT 21.9 (L) 36.0 - 46.0 %   MCV 87.3 78.0 - 100.0 fL   MCH 29.5 26.0 - 34.0 pg   MCHC 33.8 30.0 - 36.0 g/dL   RDW 14.2 11.5 - 15.5 %   Platelets 360 150 - 400 K/uL  Procalcitonin     Status: None   Collection Time: 10/22/14  5:00 AM  Result Value Ref Range   Procalcitonin 0.60 ng/mL     Comment:        Interpretation: PCT > 0.5 ng/mL and <= 2 ng/mL: Systemic infection (sepsis) is possible, but other conditions are known to elevate PCT as well. (NOTE)         ICU PCT Algorithm               Non ICU PCT Algorithm    ----------------------------     ------------------------------         PCT < 0.25 ng/mL                 PCT < 0.1 ng/mL     Stopping of antibiotics            Stopping of antibiotics  strongly encouraged.               strongly encouraged.    ----------------------------     ------------------------------       PCT level decrease by               PCT < 0.25 ng/mL       >= 80% from peak PCT       OR PCT 0.25 - 0.5 ng/mL          Stopping of antibiotics                                             encouraged.     Stopping of antibiotics           encouraged.    ----------------------------     ------------------------------       PCT level decrease by              PCT >= 0.25 ng/mL       < 80% from peak PCT        AND PCT >= 0.5 ng/mL             Continuing antibiotics                                              encouraged.       Continuing antibiotics            encouraged.    ----------------------------     ------------------------------     PCT level increase compared          PCT > 0.5 ng/mL         with peak PCT AND          PCT >= 0.5 ng/mL             Escalation of antibiotics                                          strongly encouraged.      Escalation of antibiotics        strongly encouraged.   Differential     Status: None (Preliminary result)   Collection Time: 10/22/14  5:00 AM  Result Value Ref Range   Neutrophils Relative % PENDING 43 - 77 %   Neutro Abs PENDING 1.7 - 7.7 K/uL   Band Neutrophils PENDING 0 - 10 %   Lymphocytes Relative PENDING 12 - 46 %   Lymphs Abs PENDING 0.7 - 4.0 K/uL   Monocytes Relative PENDING 3 - 12 %   Monocytes Absolute PENDING 0.1 - 1.0 K/uL   Eosinophils Relative PENDING 0 - 5 %   Eosinophils  Absolute PENDING 0.0 - 0.7 K/uL   Basophils Relative PENDING 0 - 1 %   Basophils Absolute PENDING 0.0 - 0.1 K/uL   WBC Morphology PENDING    RBC Morphology PENDING    Smear Review PENDING    nRBC PENDING 0 /100 WBC   Metamyelocytes Relative PENDING %   Myelocytes PENDING %   Promyelocytes Absolute PENDING %   Blasts PENDING %    MICRO:  IMAGING:  Dg Chest 1 View  10/21/2014   CLINICAL DATA:  Follow-up endotracheal tube placement.  EXAM: CHEST - 1 VIEW  COMPARISON:  Prior radiograph performed earlier on the same day.  FINDINGS: Endotracheal tube in place with tip located approximately 3.2 cm above the carina.  Severe cardiomegaly again seen. Bibasilar airspace opacities are slightly worsened, favored to reflect progressive pulmonary edema. Rapidly progressive in infection could also be considered. Right pleural effusion with probable left pleural effusion present. No pneumothorax.  Osseous structures unchanged. Right shoulder arthroplasty partially visualized.  IMPRESSION: 1. Tip of the endotracheal tube 3.2 cm above the carina. 2. Interval worsening and bilateral airspace disease, favored to reflect progressive edema, although rapidly progressive pneumonia could also be considered. 3. Bilateral pleural effusions.   Electronically Signed   By: Jeannine Boga M.D.   On: 10/21/2014 19:40   Dg Chest 1 View  10/21/2014   CLINICAL DATA:  Shortness of breath.  Pneumonia.  EXAM: CHEST - 1 VIEW  COMPARISON:  10/17/2014  FINDINGS: Very low lung volumes compared to prior. There are increasing hazy opacity at the lung bases, left greater than right. Crowding of bronchovascular structures with mixed interstitial and alveolar perihilar opacities. Cardiac silhouette is obscured and not well evaluated. No pneumothorax. Stable appearance of the osseous structures.  IMPRESSION: Very low lung volumes with increased hazy bibasilar opacities, left greater than right. Development of perihilar mixed interstitial  and alveolar opacities. CHF versus progressive pneumonia or aspiration.   Electronically Signed   By: Jeb Levering M.D.   On: 10/21/2014 15:39   Dg Chest Port 1 View  10/22/2014   CLINICAL DATA:  Evaluate pneumonia.  EXAM: PORTABLE CHEST - 1 VIEW  COMPARISON:  10/21/2014  FINDINGS: Endotracheal tube is unchanged. Interval placement of NG tube which appears to double back on itself into the esophagus. The tip is not well visualized but likely in the upper cervical esophagus or neck region.  Bilateral airspace opacities are again noted with slight improvement on the left. No change on the right. Bilateral pleural effusions are noted, right greater than left. The right effusion has increased slightly since prior study. Mild cardiomegaly.  IMPRESSION: Bilateral airspace opacity, right greater than left. Some improvement in the left airspace disease since prior study.  Bilateral effusions, increasing on the right.  NG tube appears to double back on itself and pass superiorly in the esophagus with the tip likely in the neck.  These results were called by telephone at the time of interpretation on 10/22/2014 at 8:59 am to patient's nurse, Lenna Sciara, who verbally acknowledged these results.   Electronically Signed   By: Rolm Baptise M.D.   On: 10/22/2014 09:00   Dg Abd Portable 1v  10/22/2014   CLINICAL DATA:  Nasogastric tube placement  EXAM: PORTABLE ABDOMEN - 1 VIEW  COMPARISON:  October 22, 2014, 6:06 a.m.  FINDINGS: The bowel gas pattern is normal. Nasogastric tube is identified with distal tip in the mid stomach. Right femoral line is unchanged. 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  IMPRESSION: Nasogastric tube with distal tip in the mid stomach.   Electronically Signed   By: Abelardo Diesel M.D.   On: 10/22/2014 10:47   Dg Abd Portable 1v  10/22/2014   CLINICAL DATA:  Nasogastric tube placement  EXAM: PORTABLE ABDOMEN - 1 VIEW  COMPARISON:  CT 05/29/2013  FINDINGS: Nasogastric tube  extends to the gastric antrum. Stomach decompressed. Small bowel decompressed. Normal distribution of gas and stool  throughout the nondilated colon. Right femoral central line extending to the level of the iliac confluence. Advanced erosive changes in bilateral hips.  IMPRESSION: 1. Nasogastric tube to decompressed stomach. 2. Nonobstructed bowel gas pattern.   Electronically Signed   By: Arne Cleveland M.D.   On: 10/22/2014 09:02    Assessment/Plan:  54yo F with well controlled HIV disease, bipolar disease admitted at OSH for symptomatic obstructive uropathy and possible pneumonia. Found to have cdifficile colitis dx on 12/27. Over last 2 days had episode of hypotension where she received fluid hydration and subsequently developed respiratory distress requiring intubation, marked change from 12/27 to 12/31 on cxr c/w pulmonary edema with presumed concominant pneumonia.  1) VDRF due to pulmonary edema and possibly concominant pneumonia = respiratory virus panel pending, would send respiratory culture. For now continue on piptazo  2) c.difficile colitis = continue on oral vancomycin 116m QID. Will try to limit unnecessary antibiotics  3) candidal esophagitis = dx by EGD on 12/10, but started tx on 12/17. Continue with fluconazole 2029mdaily  4) hiv = hold HIV regimen until patient can safely swallow, no longer extubated  5) obstructive uropathy = thought to be due to ditropan, appears resolved  6) marked leukocytosis = anticipate to improve, thought to be related to concominant infection  Dr HaJohnnye Simao see over the weekend to provide [place] [facility] [phone]

## 2014-10-22 NOTE — Progress Notes (Signed)
Rec'd call from radiologist re placement of OB tube.  Tube coiled back and ending in esophagus.  Recommended removal of tube and replacement.  Og removed.  3F [place] tube placed.  Will reorder KUB for confirmation of placement.  Muzamil Harker, [facility]

## 2014-10-22 NOTE — Progress Notes (Signed)
Echocardiogram 2D Echocardiogram has been performed.  [name] 10/22/2014, 1:29 PM

## 2014-10-22 NOTE — Progress Notes (Signed)
ANTIBIOTIC CONSULT NOTE  Pharmacy Consult for Fluconazole and Zosyn Indication: Esophageal candidiasis, aspiration PNA  Allergies  Allergen Reactions  . Aspirin Nausea And Vomiting  . Sulfonamide Derivatives Hives    Patient Measurements: Height: 5\' 4"  (162.6 cm) Weight: 168 lb 6.9 oz (76.4 kg) IBW/kg (Calculated) : 54.7 Vital Signs: Temp: 99.1 F (37.3 C) (01/01 0817) Temp Source: Oral (01/01 0817) BP: 92/66 mmHg (01/01 1115) Pulse Rate: 109 (01/01 1115) Intake/Output from previous day: 12/31 0701 - 01/01 0700 In: 1419.5 [P.O.:720; I.V.:562; IV Piggyback:137.5] Out: 2505 [Urine:2505] Intake/Output from this shift: Total I/O In: 357.5 [P.O.:120; I.V.:175; IV Piggyback:62.5] Out: -   Labs:  Recent Labs  10/20/14 0658 10/21/14 2300 10/22/14 0500  WBC 8.9  --  30.2*  HGB 8.0*  --  7.4*  PLT 329  --  360  CREATININE 0.60 0.77 0.73   Estimated Creatinine Clearance: 83.3 mL/min (by C-G formula based on Cr of 0.73). No results for input(s): VANCOTROUGH, VANCOPEAK, VANCORANDOM, GENTTROUGH, GENTPEAK, GENTRANDOM, TOBRATROUGH, TOBRAPEAK, TOBRARND, AMIKACINPEAK, AMIKACINTROU, AMIKACIN in the last 72 hours.   Microbiology: Recent Results (from the past 720 hour(s))  Urine culture     Status: None   Collection Time: 10/12/14  2:50 PM  Result Value Ref Range Status   Specimen Description URINE, CATHETERIZED  Final   Special Requests NONE  Final   Culture  Setup Time   Final    10/13/2014 00:54 Performed at [place] Performed at [facility]   Final   Culture NO GROWTH Performed at [facility]   Final   Report Status 10/13/2014 FINAL  Final  Culture, blood (routine x 2)     Status: None   Collection Time: 10/12/14  3:17 PM  Result Value Ref Range Status   Specimen Description BLOOD RIGHT HAND  Final   Special Requests BOTTLES DRAWN AEROBIC ONLY 5CC  Final   Culture NO GROWTH 5 DAYS  Final   Report Status  10/17/2014 FINAL  Final  Culture, blood (routine x 2)     Status: None   Collection Time: 10/12/14  5:00 PM  Result Value Ref Range Status   Specimen Description BLOOD RIGHT HAND  Final   Special Requests BOTTLES DRAWN AEROBIC ONLY 5CC  Final   Culture NO GROWTH 5 DAYS  Final   Report Status 10/17/2014 FINAL  Final  MRSA PCR Screening     Status: None   Collection Time: 10/12/14  8:00 PM  Result Value Ref Range Status   MRSA by PCR NEGATIVE NEGATIVE Final    Comment:        The GeneXpert MRSA Assay (FDA approved for NASAL specimens only), is one component of a comprehensive MRSA colonization surveillance program. It is not intended to diagnose MRSA infection nor to guide or monitor treatment for MRSA infections.   CSF culture     Status: None   Collection Time: 10/14/14 10:50 AM  Result Value Ref Range Status   Specimen Description CSF  Final   Special Requests NONE  Final   Gram Stain   Final    WBC PRESENT, PREDOMINANTLY MONONUCLEAR NO ORGANISMS SEEN CYTOSPIN Performed at [facility] Performed at [place]   Final    NO GROWTH 3 DAYS Performed at [facility]    Report Status 10/17/2014 FINAL  Final  Clostridium Difficile by PCR     Status: Abnormal   Collection  Time: 10/17/14  3:41 PM  Result Value Ref Range Status   C difficile by pcr POSITIVE (A) NEGATIVE Final    Comment: CRITICAL RESULT CALLED TO, READ BACK BY AND VERIFIED WITH: N.ROWE AT 1732 ON 10/17/14 BY S.VANHOORNE   Culture, blood (routine x 2)     Status: None (Preliminary result)   Collection Time: 10/17/14  5:01 PM  Result Value Ref Range Status   Specimen Description BLOOD LEFT ANTECUBITAL  Final   Special Requests BOTTLES DRAWN AEROBIC AND ANAEROBIC 6CC  Final   Culture NO GROWTH 4 DAYS  Final   Report Status PENDING  Incomplete  Culture, Urine     Status: None   Collection Time: 10/17/14  5:33 PM  Result Value Ref Range Status   Specimen Description  URINE, CATHETERIZED  Final   Special Requests NONE  Final   Colony Count NO GROWTH Performed at [facility]   Final   Culture NO GROWTH Performed at [facility]   Final   Report Status 10/19/2014 FINAL  Final  Culture, blood (routine x 2)     Status: None (Preliminary result)   Collection Time: 10/17/14  9:09 PM  Result Value Ref Range Status   Specimen Description BLOOD LEFT HAND  Final   Special Requests BOTTLES DRAWN AEROBIC ONLY 5CC  Final   Culture NO GROWTH 4 DAYS  Final   Report Status PENDING  Incomplete    Assessment: 54 year old female with esophageal candidiasis to receive fluconazole. Current HIV medications (darunavir, ritonavir, Truvada, Isentress) are on hold - monitor for interactions if resumed and possible need for dose adjustment.  Also to restart Zosyn (was previously on for 1 day at [facility]) for aspiration PNA. SCr 0.73, CrCl ~41mL/min. Blood cultures negative to date.  Goal of Therapy:  Clinical resolution of infection.  Plan:  -Continue fluconazole 200mg  IV daily- f/up ability to change to oral.  -Zosyn 3.375g IV q8h EI -Monitor for drug interactions and need to reduce dose -follow c/s, clinical progression, renal function  Emauri Krygier D. Regenia Erck, PharmD, BCPS Clinical Pharmacist Pager: [phone] 10/22/2014 11:25 AM

## 2014-10-22 NOTE — H&P (Signed)
PULMONARY / CRITICAL CARE MEDICINE   Name: [name] MRN: [id number] DOB: 09-28-1961    ADMISSION DATE:  10/21/2014  REFERRING MD :  Verneita Griffes  CHIEF COMPLAINT:  Short of breath  INITIAL PRESENTATION:  She had EGD on 09/30/14 due to  N/V and difficulty swallowing.  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  She was sent to ER on 10/12/14 with vomiting blood and acute encephalopathy.  She was also in acute renal failure with creatinine 11.6 (baseline creatinine 0.52) and with hyperkalemia and metabolic acidosis.  She had follow catheter inserted in ER at [facility], and had almost 6 liters urine outpt as soon as catheter was placed.  She was seen by nephrology on 10/13/14 and signed off 10/14/14.  She had LP done by IR on 10/14/14.  Her mental status was improving.  She was started on Abx for sepsis and possible aspiration pneumonia.  She was eventually started on diflucan for esophageal candidiasis.  She developed diarrhea and was found to be positive for C diff.  She was started on oral vancomycin.  She continued to improve, and was transferred out of ICU on 10/19/14.  She had speech therapy swallow evaluation which was normal.  She continued to fail voiding trials, and had foley catheter replaced.  She developed hypotension and dizziness, and was given fluid bolus and albumin.  She developed progressive dyspnea on 10/21/14.  She was treated with BiPAP and lasix, and transferred back to ICU.  Her respiratory status did not improve, and she was intubated.  She was then transferred to [facility] for further treatment.  .  STUDIES:  12/22 CT head >> no acute abnormalities 12/23 MRI brain >> normal for age 40/23 Renal u/s >> normal 12/24 LP ([facility]) >> protein 29, glucose 73, WBC 0, HSV DNA negative, Cryptococcal Ag negative 12/26 MRI T/L spine >> mild  degenerative disease, mild spondylosis L4-L5, L5-S1  SIGNIFICANT EVENTS: 12/10 Outpt EGD >> candidal esophagitis 12/22 admit for ARF with obstructive uropathy at [facility] 12/25 Tachycardia >> tx with diltiazem 12/27 Diarrhea 12/29 Speech therapy >> normal swallow eval 12/30 hypotension 12/31 VDRF, transfer to [facility]   SUBJECTIVE/OVERNIGHT/INTERVAL HX  10/22/2014: off pressors, On fent gtt  + prn fent  + bicarb gtt -. Follows commands. On 60%, peep 12 PCV   VITAL SIGNS: Temp:  [98.6 F (37 C)-100 F (37.8 C)] 100 F (37.8 C) (01/01 1200) Pulse Rate:  [77-109] 109 (01/01 1300) Resp:  [17-49] 22 (01/01 1300) BP: (88-117)/(59-90) 100/72 mmHg (01/01 1300) SpO2:  [86 %-100 %] 94 % (01/01 1300) FiO2 (%):  [50 %-100 %] 60 % (01/01 1115) HEMODYNAMICS:   VENTILATOR SETTINGS: Vent Mode:  [-] PCV FiO2 (%):  [50 %-100 %] 60 % Set Rate:  [16 bmp] 16 bmp Vt Set:  [450 mL-500 mL] 450 mL PEEP:  [5 cmH20-12 cmH20] 12 cmH20 Plateau Pressure:  [20 cmH20-26 cmH20] 25 cmH20 INTAKE / OUTPUT:  Intake/Output Summary (Last 24 hours) at 10/22/14 1435 Last data filed at 10/22/14 1400  Gross per 24 hour  Intake 1406.96 ml  Output   2705 ml  Net -1298.04 ml    PHYSICAL EXAMINATION: General: ill appearing Neuro:  Follows simple commands HEENT:  ETT in place Cardiovascular:  Regular, tachycardic Lungs:  B/l rales Abdomen:  Soft, non tender Musculoskeletal:  No edema Skin:  No rashes  LABS:  PULMONARY  Recent Labs Lab 10/21/14 1539 10/21/14 1801 10/21/14 2228 10/22/14 0115  PHART 7.330* 7.356 7.358 7.335*  PCO2ART 26.8* 29.5* 30.0* 28.9*  PO2ART 54.3* 66.8* 50.9* 67.0*  HCO3 13.8* 16.1* 16.4* 15.4*  TCO2 13.4 15.3 17.4 16  O2SAT 81.4 90.5 79.7 92.0    CBC  Recent Labs Lab 10/19/14 0637 10/20/14 0658 10/22/14 0500  HGB 8.6* 8.0* 7.4*  HCT 26.4* 23.9* 21.9*  WBC 11.6* 8.9 30.2*  PLT 322 329 360    COAGULATION No results for input(s): INR in the last 168 hours.  CARDIAC    Recent Labs Lab 10/21/14 1545  TROPONINI <0.03   No results for input(s): PROBNP in the last 168 hours.   CHEMISTRY  Recent Labs Lab 10/16/14 0650  10/18/14 0539 10/19/14 0637 10/20/14 0658 10/21/14 0139 10/21/14 0807 10/21/14 2300 10/22/14 0500  NA  --   --  140 139 136  --   --  136 136  K  --   < > 4.3 3.8 2.9* 3.3* 3.8 4.5 4.2  CL  --   --  110 107 109  --   --  112 110  CO2  --   --  25 25 22   --   --  19 19  GLUCOSE  --   --  94 87 72  --   --  114* 113*  BUN  --   --  <5* 5* <5*  --   --  <5* <5*  CREATININE  --   --  0.55 0.53 0.60  --   --  0.77 0.73  CALCIUM  --   --  7.7* 7.3* 6.5*  --   --  7.4* 7.7*  MG 1.4*  --   --   --   --  1.1* 2.2  --  1.5  PHOS  --   --   --   --   --   --   --   --  3.6  < > = values in this interval not displayed. Estimated Creatinine Clearance: 83.3 mL/min (by C-G formula based on Cr of 0.73).   LIVER  Recent Labs Lab 10/18/14 0539 10/19/14 0637 10/20/14 0658 10/21/14 2300  AST 34 30 23 27   ALT 24 22 19 17   ALKPHOS 96 96 93 100  BILITOT 0.6 0.6 0.5 0.4  PROT 5.6* 5.3* 4.9* 5.1*  ALBUMIN 1.8* 1.7* 1.6* 1.8*     INFECTIOUS  Recent Labs Lab 10/21/14 0807 10/21/14 1022 10/21/14 2230 10/22/14 0500  LATICACIDVEN  --  2.0 2.1 2.0  PROCALCITON 0.37  --   --  0.60     ENDOCRINE CBG (last 3)   Recent Labs  10/21/14 2203  GLUCAP 91         IMAGING x48h Dg Chest 1 View  10/21/2014   CLINICAL DATA:  Follow-up endotracheal tube placement.  EXAM: CHEST - 1 VIEW  COMPARISON:  Prior radiograph performed earlier on the same day.  FINDINGS: Endotracheal tube in place with tip located approximately 3.2 cm above the carina.  Severe cardiomegaly again seen. Bibasilar airspace opacities are slightly worsened, favored to reflect progressive pulmonary edema. Rapidly progressive in infection could also be considered. Right pleural effusion with probable left pleural effusion present. No pneumothorax.  Osseous structures  unchanged. Right shoulder arthroplasty partially visualized.  IMPRESSION: 1. Tip of the endotracheal tube 3.2 cm above the carina. 2. Interval worsening and bilateral airspace disease, favored to reflect progressive edema, although rapidly progressive pneumonia could  also be considered. 3. Bilateral pleural effusions.   Electronically Signed   By: Jeannine Boga M.D.   On: 10/21/2014 19:40   Dg Chest 1 View  10/21/2014   CLINICAL DATA:  Shortness of breath.  Pneumonia.  EXAM: CHEST - 1 VIEW  COMPARISON:  10/17/2014  FINDINGS: Very low lung volumes compared to prior. There are increasing hazy opacity at the lung bases, left greater than right. Crowding of bronchovascular structures with mixed interstitial and alveolar perihilar opacities. Cardiac silhouette is obscured and not well evaluated. No pneumothorax. Stable appearance of the osseous structures.  IMPRESSION: Very low lung volumes with increased hazy bibasilar opacities, left greater than right. Development of perihilar mixed interstitial and alveolar opacities. CHF versus progressive pneumonia or aspiration.   Electronically Signed   By: Jeb Levering M.D.   On: 10/21/2014 15:39   Dg Chest Port 1 View  10/22/2014   CLINICAL DATA:  Evaluate pneumonia.  EXAM: PORTABLE CHEST - 1 VIEW  COMPARISON:  10/21/2014  FINDINGS: Endotracheal tube is unchanged. Interval placement of NG tube which appears to double back on itself into the esophagus. The tip is not well visualized but likely in the upper cervical esophagus or neck region.  Bilateral airspace opacities are again noted with slight improvement on the left. No change on the right. Bilateral pleural effusions are noted, right greater than left. The right effusion has increased slightly since prior study. Mild cardiomegaly.  IMPRESSION: Bilateral airspace opacity, right greater than left. Some improvement in the left airspace disease since prior study.  Bilateral effusions, increasing on the right.   NG tube appears to double back on itself and pass superiorly in the esophagus with the tip likely in the neck.  These results were called by telephone at the time of interpretation on 10/22/2014 at 8:59 am to patient's nurse, Lenna Sciara, who verbally acknowledged these results.   Electronically Signed   By: Rolm Baptise M.D.   On: 10/22/2014 09:00   Dg Abd Portable 1v  10/22/2014   CLINICAL DATA:  Nasogastric tube placement  EXAM: PORTABLE ABDOMEN - 1 VIEW  COMPARISON:  October 22, 2014, 6:06 a.m.  FINDINGS: The bowel gas pattern is normal. Nasogastric tube is identified with distal tip in the mid stomach. Right femoral line is unchanged. 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  IMPRESSION: Nasogastric tube with distal tip in the mid stomach.   Electronically Signed   By: Abelardo Diesel M.D.   On: 10/22/2014 10:47   Dg Abd Portable 1v  10/22/2014   CLINICAL DATA:  Nasogastric tube placement  EXAM: PORTABLE ABDOMEN - 1 VIEW  COMPARISON:  CT 05/29/2013  FINDINGS: Nasogastric tube extends to the gastric antrum. Stomach decompressed. Small bowel decompressed. Normal distribution of gas and stool throughout the nondilated colon. Right femoral central line extending to the level of the iliac confluence. Advanced erosive changes in bilateral hips.  IMPRESSION: 1. Nasogastric tube to decompressed stomach. 2. Nonobstructed bowel gas pattern.   Electronically Signed   By: Arne Cleveland M.D.   On: 10/22/2014 09:02        ASSESSMENT / PLAN:  PULMONARY ETT 12/31 >> A: Acute respiratory failure 2nd to aspiration PNA. Hx of emphysema. Continued tobacco abuse.  1./1/16 - likely ARDS    P:   Full vent support Follow CXR, ABG Scheduled BD's Hold ICS for now D/c spiriva in setting of urinary retention  CARDIOVASCULAR Rt femoral CVL ([facility] in ICU by EDP) 12/31 >> A:  Severe sepsis. Hx of HTN.   - off pressors  P:  Monitor hemodynamics F/u lactic acid, procalcitonin AWait  Echo Lasix x 1  RENAL A:   AKI 2nd to obstructive uropathy >> improved with foley catheter insertion. Metabolic acidosis non gap likely hypomagnesemia. P:   REplete MAG Continue foley catheter Monitor renal fx, urine outpt F/u and replace electrolytes as needed HCO3 to IV fluid  GASTROINTESTINAL A:   Esophageal candidiasis. Diarrhea due to C diff colitis. P:   NPO Pepcid for SUP Tube feeds start  HEMATOLOGIC A:   Anemia of critical illness and chronic disease. P:  F/u CBC Transfuse for Hb < 7 Lovenox for DVT prevention  INFECTIOUS A:   Severe sepsis. C diff colitis. Aspiration PNA. Esophageal candidiasis. Possible UTI. Hx of HIV. - normal CD4 sept 2015  P:   Vancomycin, Doripenem 12/24 >> 12/27 Diflucan 12/27 >> Oral Vancomycin 12/27 >> Zosyn 12/31 >> Consult ID 10/22/14 >> hold antiretroviral meds for now  Blood 12/22 >> negative Urine 12/23 >> negative CSF 12/24 >> negative C diff PCR 12/27 >> POSITIVE Urine 12/27 >> negative Blood 12/27 >>  Blood 12/31 >> Urine 12/31 >> Sputum 12/31 >>  ENDOCRINE A:   No acute issues. P:   Monitor blood sugar on BMET  NEUROLOGIC A:   Acute encephalopathy 2nd to respiratory failure, sepsis, AKI. Hx of Bipolar disorder.  - follows command.  Notable to come off fent gtt now  P:   RASS goal: -1 PAD 2 sedation protocol  No family at bedside 12/3 and 10/22/14  Inter-disciplinary family meet or Palliative Care meeting due by 10/28/14:    The patient is critically ill with multiple organ systems failure and requires high complexity decision making for assessment and support, frequent evaluation and titration of therapies, application of advanced monitoring technologies and extensive interpretation of multiple databases.   Critical Care Time devoted to patient care services described in this note is  45  Minutes. This time reflects time of care of this signee Dr Brand Males. This critical care time does  not reflect procedure time, or teaching time or supervisory time of PA/NP/Med student/Med Resident etc but could involve care discussion time    Dr. Brand Males, M.D., [facility].C.P Pulmonary and Critical Care Medicine Staff Physician [place] [facility] Pager: [phone], If no answer or between  15:00h - 7:00h: call [phone]  10/22/2014 2:46 PM

## 2014-10-23 ENCOUNTER — Inpatient Hospital Stay (HOSPITAL_COMMUNITY): Payer: Medicare Other

## 2014-10-23 DIAGNOSIS — A0472 Enterocolitis due to Clostridium difficile, not specified as recurrent: Secondary | ICD-10-CM | POA: Diagnosis present

## 2014-10-23 DIAGNOSIS — E43 Unspecified severe protein-calorie malnutrition: Secondary | ICD-10-CM

## 2014-10-23 DIAGNOSIS — J690 Pneumonitis due to inhalation of food and vomit: Secondary | ICD-10-CM | POA: Diagnosis present

## 2014-10-23 LAB — BASIC METABOLIC PANEL
ANION GAP: 8 (ref 5–15)
CHLORIDE: 102 meq/L (ref 96–112)
CO2: 28 mmol/L (ref 19–32)
Calcium: 7.6 mg/dL — ABNORMAL LOW (ref 8.4–10.5)
Creatinine, Ser: 0.9 mg/dL (ref 0.50–1.10)
GFR calc Af Amer: 84 mL/min — ABNORMAL LOW (ref >90)
GFR calc non Af Amer: 73 mL/min — ABNORMAL LOW (ref >90)
GLUCOSE: 104 mg/dL — AB (ref 70–99)
POTASSIUM: 3.5 mmol/L (ref 3.5–5.1)
Sodium: 138 mmol/L (ref 135–145)

## 2014-10-23 LAB — BLOOD GAS, ARTERIAL
Acid-Base Excess: 2.4 mmol/L — ABNORMAL HIGH (ref 0.0–2.0)
Bicarbonate: 26.1 mEq/L — ABNORMAL HIGH (ref 20.0–24.0)
Drawn by: 41977
FIO2: 0.6 %
O2 Saturation: 88.5 %
PCO2 ART: 39.8 mmHg (ref 35.0–45.0)
PEEP: 10 cmH2O
Patient temperature: 99.9
RATE: 16 resp/min
TCO2: 27.3 mmol/L (ref 0–100)
pH, Arterial: 7.436 (ref 7.350–7.450)
pO2, Arterial: 62 mmHg — ABNORMAL LOW (ref 80.0–100.0)

## 2014-10-23 LAB — GLUCOSE, CAPILLARY
GLUCOSE-CAPILLARY: 100 mg/dL — AB (ref 70–99)
Glucose-Capillary: 95 mg/dL (ref 70–99)
Glucose-Capillary: 112 mg/dL — ABNORMAL HIGH (ref 70–99)

## 2014-10-23 LAB — CBC WITH DIFFERENTIAL/PLATELET
Basophils Absolute: 0 10*3/uL (ref 0.0–0.1)
Basophils Relative: 0 % (ref 0–1)
EOS ABS: 0.2 10*3/uL (ref 0.0–0.7)
Eosinophils Relative: 1 % (ref 0–5)
HEMATOCRIT: 21.6 % — AB (ref 36.0–46.0)
Hemoglobin: 7.2 g/dL — ABNORMAL LOW (ref 12.0–15.0)
LYMPHS PCT: 19 % (ref 12–46)
Lymphs Abs: 4.3 10*3/uL — ABNORMAL HIGH (ref 0.7–4.0)
MCH: 28.9 pg (ref 26.0–34.0)
MCHC: 33.3 g/dL (ref 30.0–36.0)
MCV: 86.7 fL (ref 78.0–100.0)
Monocytes Absolute: 1 10*3/uL (ref 0.1–1.0)
Monocytes Relative: 4 % (ref 3–12)
Neutro Abs: 16.6 10*3/uL — ABNORMAL HIGH (ref 1.7–7.7)
Neutrophils Relative %: 76 % (ref 43–77)
Platelets: 404 10*3/uL — ABNORMAL HIGH (ref 150–400)
RBC: 2.49 MIL/uL — AB (ref 3.87–5.11)
RDW: 14.4 % (ref 11.5–15.5)
WBC: 22.2 10*3/uL — ABNORMAL HIGH (ref 4.0–10.5)

## 2014-10-23 LAB — CULTURE, BLOOD (ROUTINE X 2)
CULTURE: NO GROWTH
Culture: NO GROWTH

## 2014-10-23 LAB — PHOSPHORUS
Phosphorus: 4 mg/dL (ref 2.3–4.6)
Phosphorus: 3.9 mg/dL (ref 2.3–4.6)

## 2014-10-23 LAB — MAGNESIUM
Magnesium: 1.6 mg/dL (ref 1.5–2.5)
Magnesium: 1.6 mg/dL (ref 1.5–2.5)

## 2014-10-23 LAB — PROCALCITONIN: Procalcitonin: 0.46 ng/mL

## 2014-10-23 LAB — BRAIN NATRIURETIC PEPTIDE: B Natriuretic Peptide: 875.8 pg/mL — ABNORMAL HIGH (ref 0.0–100.0)

## 2014-10-23 MED ORDER — MAGNESIUM SULFATE 2 GM/50ML IV SOLN
2.0000 g | Freq: Once | INTRAVENOUS | Status: AC
  Administered 2014-10-23: 2 g via INTRAVENOUS
  Filled 2014-10-23: qty 50

## 2014-10-23 MED ORDER — VITAL HIGH PROTEIN PO LIQD: 1000.0000 mL | ORAL | Status: DC

## 2014-10-23 MED ORDER — VITAL AF 1.2 CAL PO LIQD
1000.0000 mL | ORAL | Status: DC
  Administered 2014-10-23 – 2014-10-26 (×4): 1000 mL
  Filled 2014-10-23 (×8): qty 1000

## 2014-10-23 MED ORDER — POTASSIUM CHLORIDE 20 MEQ/15ML (10%) PO SOLN
40.0000 meq | Freq: Two times a day (BID) | ORAL | Status: DC
  Administered 2014-10-23 – 2014-10-29 (×12): 40 meq
  Filled 2014-10-23 (×19): qty 30

## 2014-10-23 MED ORDER — POTASSIUM CHLORIDE 20 MEQ/15ML (10%) PO SOLN
40.0000 meq | Freq: Once | ORAL | Status: AC
  Administered 2014-10-23: 40 meq
  Filled 2014-10-23 (×2): qty 30

## 2014-10-23 MED ORDER — FUROSEMIDE 10 MG/ML IJ SOLN
40.0000 mg | Freq: Three times a day (TID) | INTRAMUSCULAR | Status: DC
  Administered 2014-10-23 – 2014-10-25 (×6): 40 mg via INTRAVENOUS
  Filled 2014-10-23 (×10): qty 4

## 2014-10-23 MED ORDER — PRO-STAT SUGAR FREE PO LIQD
30.0000 mL | Freq: Two times a day (BID) | ORAL | Status: AC
  Administered 2014-10-23 (×2): 30 mL
  Filled 2014-10-23 (×2): qty 30

## 2014-10-23 NOTE — Progress Notes (Signed)
INFECTIOUS DISEASE PROGRESS NOTE  ID: [name] is a 54 y.o. female with  Active Problems:   Human immunodeficiency virus (HIV) disease   Essential hypertension   Bipolar 1 disorder   Acute respiratory failure with hypoxia   Renal failure   Acute renal failure   Acute encephalopathy   Sepsis   Aspiration pneumonia  Subjective: Per nsg- no BM in 2 days.  She awakens and recognizes me.   Abtx:  Anti-infectives    Start     Dose/Rate Route Frequency Ordered Stop   10/22/14 1000  fluconazole (DIFLUCAN) IVPB 200 mg     200 mg100 mL/hr over 60 Minutes Intravenous Every 24 hours 10/21/14 2231     10/21/14 2300  piperacillin-tazobactam (ZOSYN) IVPB 3.375 g     3.375 g12.5 mL/hr over 240 Minutes Intravenous 3 times per day 10/21/14 2254     10/21/14 2218  vancomycin (VANCOCIN) 50 mg/mL oral solution 125 mg     125 mg Per Tube 4 times daily 10/21/14 2215 10/31/14 1359   10/21/14 1000  fluconazole (DIFLUCAN) tablet 200 mg  Status:  Discontinued     200 mg Oral Daily 10/20/14 1610 10/21/14 2215   10/17/14 1800  vancomycin (VANCOCIN) 50 mg/mL oral solution 125 mg  Status:  Discontinued     125 mg Oral 4 times daily 10/17/14 1736 10/21/14 2215   10/17/14 1400  clindamycin (CLEOCIN) capsule 300 mg  Status:  Discontinued     300 mg Oral 3 times per day 10/17/14 1022 10/17/14 1130   10/17/14 1200  fluconazole (DIFLUCAN) tablet 100 mg  Status:  Discontinued     100 mg Oral Daily 10/17/14 1152 10/20/14 1610   10/16/14 0800  ritonavir (NORVIR) tablet 100 mg  Status:  Discontinued     100 mg Oral Daily with breakfast 10/15/14 1237 10/21/14 2215   10/16/14 0800  Darunavir Ethanolate (PREZISTA) tablet 800 mg  Status:  Discontinued     800 mg Oral Daily with breakfast 10/15/14 1237 10/21/14 2215   10/15/14 1245  raltegravir (ISENTRESS) tablet 400 mg  Status:  Discontinued     400 mg Oral 2 times daily 10/15/14 1237 10/21/14 2215   10/15/14 1245  emtricitabine-tenofovir (TRUVADA) 200-300 MG  per tablet 1 tablet  Status:  Discontinued     1 tablet Oral Daily 10/15/14 1237 10/21/14 2215   10/14/14 1400  doripenem (DORIBAX) 500 mg in sodium chloride 0.9 % 100 mL IVPB  Status:  Discontinued     500 mg100 mL/hr over 60 Minutes Intravenous 3 times per day 10/14/14 1040 10/17/14 1022   10/14/14 0600  vancomycin (VANCOCIN) IVPB 750 mg/150 ml premix  Status:  Discontinued     750 mg150 mL/hr over 60 Minutes Intravenous Every 12 hours 10/13/14 1822 10/17/14 1022   10/13/14 2200  piperacillin-tazobactam (ZOSYN) IVPB 3.375 g  Status:  Discontinued     3.375 g12.5 mL/hr over 240 Minutes Intravenous Every 8 hours 10/13/14 1822 10/14/14 1039   10/13/14 1900  vancomycin (VANCOCIN) 1,250 mg in sodium chloride 0.9 % 250 mL IVPB     1,250 mg166.7 mL/hr over 90 Minutes Intravenous  Once 10/13/14 1822 10/13/14 2109   10/13/14 1230  cefTRIAXone (ROCEPHIN) 1 g in dextrose 5 % 50 mL IVPB - Premix  Status:  Discontinued     1 g100 mL/hr over 30 Minutes Intravenous Every 24 hours 10/13/14 1113 10/14/14 0820      Medications:  Scheduled: . antiseptic oral rinse  7  mL Mouth Rinse QID  . chlorhexidine  15 mL Mouth Rinse BID  . enoxaparin (LOVENOX) injection  40 mg Subcutaneous Q24H  . famotidine (PEPCID) IV  20 mg Intravenous Q12H  . fluconazole (DIFLUCAN) IV  200 mg Intravenous Q24H  . ipratropium-albuterol  3 mL Nebulization Q4H  . nystatin-triamcinolone   Topical BID  . piperacillin-tazobactam (ZOSYN)  IV  3.375 g Intravenous 3 times per day  . vancomycin  125 mg Per Tube QID    Objective: Vital signs in last 24 hours: Temp:  [99 F (37.2 C)-100.2 F (37.9 C)] 100.2 F (37.9 C) (01/02 0818) Pulse Rate:  [102-117] 115 (01/02 0900) Resp:  [15-35] 15 (01/02 0900) BP: (73-152)/(46-101) 97/66 mmHg (01/02 0900) SpO2:  [88 %-100 %] 94 % (01/02 0900) FiO2 (%):  [40 %-70 %] 60 % (01/02 0837)   General appearance: alert and mild distress Resp: rhonchi bilaterally Cardio: regular rate and  rhythm GI: normal findings: soft, non-tender and abnormal findings:  hypoactive bowel sounds Extremities: edema 3+ ankle  Lab Results  Recent Labs  10/22/14 0500 10/23/14 0419  WBC 30.2* 22.2*  HGB 7.4* 7.2*  HCT 21.9* 21.6*  NA 136 138  K 4.2 3.5  CL 110 102  CO2 19 28  BUN <5* <5*  CREATININE 0.73 0.90   Liver Panel  Recent Labs  10/21/14 2300  PROT 5.1*  ALBUMIN 1.8*  AST 27  ALT 17  ALKPHOS 100  BILITOT 0.4   Sedimentation Rate No results for input(s): ESRSEDRATE in the last 72 hours. C-Reactive Protein No results for input(s): CRP in the last 72 hours.  Microbiology: Recent Results (from the past 240 hour(s))  CSF culture     Status: None   Collection Time: 10/14/14 10:50 AM  Result Value Ref Range Status   Specimen Description CSF  Final   Special Requests NONE  Final   Gram Stain   Final    WBC PRESENT, PREDOMINANTLY MONONUCLEAR NO ORGANISMS SEEN CYTOSPIN Performed at [facility] Performed at [place]   Final    NO GROWTH 3 DAYS Performed at [facility]    Report Status 10/17/2014 FINAL  Final  Clostridium Difficile by PCR     Status: Abnormal   Collection Time: 10/17/14  3:41 PM  Result Value Ref Range Status   C difficile by pcr POSITIVE (A) NEGATIVE Final    Comment: CRITICAL RESULT CALLED TO, READ BACK BY AND VERIFIED WITH: N.ROWE AT 7867 ON 10/17/14 BY S.VANHOORNE   Culture, blood (routine x 2)     Status: None   Collection Time: 10/17/14  5:01 PM  Result Value Ref Range Status   Specimen Description BLOOD LEFT ANTECUBITAL  Final   Special Requests BOTTLES DRAWN AEROBIC AND ANAEROBIC 6CC  Final   Culture NO GROWTH 6 DAYS  Final   Report Status 10/23/2014 FINAL  Final  Culture, Urine     Status: None   Collection Time: 10/17/14  5:33 PM  Result Value Ref Range Status   Specimen Description URINE, CATHETERIZED  Final   Special Requests NONE  Final   Colony Count NO GROWTH Performed at [facility]   Final   Culture NO GROWTH Performed at [facility]   Final   Report Status 10/19/2014 FINAL  Final  Culture, blood (routine x 2)     Status: None   Collection Time: 10/17/14  9:09 PM  Result Value Ref Range Status  Specimen Description BLOOD LEFT HAND  Final   Special Requests BOTTLES DRAWN AEROBIC ONLY 5CC  Final   Culture NO GROWTH 6 DAYS  Final   Report Status 10/23/2014 FINAL  Final  Culture, blood (routine x 2)     Status: None (Preliminary result)   Collection Time: 10/21/14  3:45 PM  Result Value Ref Range Status   Specimen Description BLOOD RIGHT ANTECUBITAL  Final   Special Requests BOTTLES DRAWN AEROBIC AND ANAEROBIC 5CC  Final   Culture NO GROWTH 2 DAYS  Final   Report Status PENDING  Incomplete    Studies/Results: Dg Chest 1 View  10/21/2014   CLINICAL DATA:  Follow-up endotracheal tube placement.  EXAM: CHEST - 1 VIEW  COMPARISON:  Prior radiograph performed earlier on the same day.  FINDINGS: Endotracheal tube in place with tip located approximately 3.2 cm above the carina.  Severe cardiomegaly again seen. Bibasilar airspace opacities are slightly worsened, favored to reflect progressive pulmonary edema. Rapidly progressive in infection could also be considered. Right pleural effusion with probable left pleural effusion present. No pneumothorax.  Osseous structures unchanged. Right shoulder arthroplasty partially visualized.  IMPRESSION: 1. Tip of the endotracheal tube 3.2 cm above the carina. 2. Interval worsening and bilateral airspace disease, favored to reflect progressive edema, although rapidly progressive pneumonia could also be considered. 3. Bilateral pleural effusions.   Electronically Signed   By: Jeannine Boga M.D.   On: 10/21/2014 19:40   Dg Chest 1 View  10/21/2014   CLINICAL DATA:  Shortness of breath.  Pneumonia.  EXAM: CHEST - 1 VIEW  COMPARISON:  10/17/2014  FINDINGS: Very low lung volumes compared to prior. There are  increasing hazy opacity at the lung bases, left greater than right. Crowding of bronchovascular structures with mixed interstitial and alveolar perihilar opacities. Cardiac silhouette is obscured and not well evaluated. No pneumothorax. Stable appearance of the osseous structures.  IMPRESSION: Very low lung volumes with increased hazy bibasilar opacities, left greater than right. Development of perihilar mixed interstitial and alveolar opacities. CHF versus progressive pneumonia or aspiration.   Electronically Signed   By: Jeb Levering M.D.   On: 10/21/2014 15:39   Dg Chest Port 1 View  10/23/2014   CLINICAL DATA:  Acute respiratory failure  EXAM: PORTABLE CHEST - 1 VIEW  COMPARISON:  10/22/2014  FINDINGS: Endotracheal tube tip 4 cm above the carina. Nasogastric tube enters the stomach.  Bilateral airspace opacities, essentially stable on the right and increased on the left. Suspected layering right pleural effusion. Upper normal heart size. Suspected layering right pleural effusion.  Avascular necrosis, left humeral head. Prior right proximal humeral implant.  IMPRESSION: 1. Bilateral airspace opacities, increased on the left compared to the prior exam, potentially from asymmetric pulmonary edema or bilateral pneumonia. Suspected layering right pleural effusion.   Electronically Signed   By: Sherryl Barters M.D.   On: 10/23/2014 09:15   Dg Chest Port 1 View  10/22/2014   CLINICAL DATA:  Evaluate pneumonia.  EXAM: PORTABLE CHEST - 1 VIEW  COMPARISON:  10/21/2014  FINDINGS: Endotracheal tube is unchanged. Interval placement of NG tube which appears to double back on itself into the esophagus. The tip is not well visualized but likely in the upper cervical esophagus or neck region.  Bilateral airspace opacities are again noted with slight improvement on the left. No change on the right. Bilateral pleural effusions are noted, right greater than left. The right effusion has increased slightly since prior study.  Mild  cardiomegaly.  IMPRESSION: Bilateral airspace opacity, right greater than left. Some improvement in the left airspace disease since prior study.  Bilateral effusions, increasing on the right.  NG tube appears to double back on itself and pass superiorly in the esophagus with the tip likely in the neck.  These results were called by telephone at the time of interpretation on 10/22/2014 at 8:59 am to patient's nurse, Lenna Sciara, who verbally acknowledged these results.   Electronically Signed   By: Rolm Baptise M.D.   On: 10/22/2014 09:00   Dg Abd Portable 1v  10/22/2014   CLINICAL DATA:  Nasogastric tube placement  EXAM: PORTABLE ABDOMEN - 1 VIEW  COMPARISON:  October 22, 2014, 6:06 a.m.  FINDINGS: The bowel gas pattern is normal. Nasogastric tube is identified with distal tip in the mid stomach. Right femoral line is unchanged. 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  IMPRESSION: Nasogastric tube with distal tip in the mid stomach.   Electronically Signed   By: Abelardo Diesel M.D.   On: 10/22/2014 10:47   Dg Abd Portable 1v  10/22/2014   CLINICAL DATA:  Nasogastric tube placement  EXAM: PORTABLE ABDOMEN - 1 VIEW  COMPARISON:  CT 05/29/2013  FINDINGS: Nasogastric tube extends to the gastric antrum. Stomach decompressed. Small bowel decompressed. Normal distribution of gas and stool throughout the nondilated colon. Right femoral central line extending to the level of the iliac confluence. Advanced erosive changes in bilateral hips.  IMPRESSION: 1. Nasogastric tube to decompressed stomach. 2. Nonobstructed bowel gas pattern.   Electronically Signed   By: Arne Cleveland M.D.   On: 10/22/2014 09:02     Assessment/Plan: C diff ARF due to obstructive uropathy (ditropan?) VDRF (aspiration vs fluid overload) Candida esophagitis Severe protein calorie malnutrition  Total days of antibiotics: 11 Day 7 po vanco Day 3 zosyn Day 7 fluconazole  Is improving, wbc better. Cr normal She  is a/a, defer to CCM re plans for extb Plan for short course of zosyn, watch CXR for clearance now that her kidney function has improved.  She is off ART- will check HLA to see if she can go on ABC regiemen? 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

## 2014-10-23 NOTE — Progress Notes (Signed)
PULMONARY / CRITICAL CARE MEDICINE   Name: [name] MRN: [id number] DOB: 1961-09-03    ADMISSION DATE:  10/21/2014  REFERRING MD :  Verneita Griffes  CHIEF COMPLAINT:  Short of breath  INITIAL PRESENTATION:  She had EGD on 09/30/14 due to  N/V and difficulty swallowing.  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  She was sent to ER on 10/12/14 with vomiting blood and acute encephalopathy.  She was also in acute renal failure with creatinine 11.6 (baseline creatinine 0.52) and with hyperkalemia and metabolic acidosis.  She had follow catheter inserted in ER at [facility], and had almost 6 liters urine outpt as soon as catheter was placed.  She was seen by nephrology on 10/13/14 and signed off 10/14/14.  She had LP done by IR on 10/14/14.  Her mental status was improving.  She was started on Abx for sepsis and possible aspiration pneumonia.  She was eventually started on diflucan for esophageal candidiasis.  She developed diarrhea and was found to be positive for C diff.  She was started on oral vancomycin.  She continued to improve, and was transferred out of ICU on 10/19/14.  She had speech therapy swallow evaluation which was normal.  She continued to fail voiding trials, and had foley catheter replaced.  She developed hypotension and dizziness, and was given fluid bolus and albumin.  She developed progressive dyspnea on 10/21/14.  She was treated with BiPAP and lasix, and transferred back to ICU.  Her respiratory status did not improve, and she was intubated.  She was then transferred to [facility] for further treatment.  .  STUDIES:  12/22 CT head >> no acute abnormalities 12/23 MRI brain >> normal for age [age] Renal u/s >> normal 12/24 LP ([facility]) >> protein 29, glucose 73, WBC 0, HSV DNA negative, Cryptococcal Ag negative 12/26 MRI T/L spine >> mild  degenerative disease, mild spondylosis L4-L5, L5-S1  SIGNIFICANT EVENTS: 12/10 Outpt EGD >> candidal esophagitis 12/22 admit for ARF with obstructive uropathy at [facility] 12/25 Tachycardia >> tx with diltiazem 12/27 Diarrhea 12/29 Speech therapy >> normal swallow eval 12/30 hypotension 12/31 VDRF, transfer to [facility] 10/22/14:  off pressors, On fent gtt  + prn fent  + bicarb gtt -. Follows commands. On 60%, peep 12 PCV    SUBJECTIVE/OVERNIGHT/INTERVAL HX  10/23/2014: respondd to lasix but still with diffuse infiltrates,. fio2 60%, peep10. Not on pressors. On fent gtt + bicarb gtt.   VITAL SIGNS: Temp:  [99 F (37.2 C)-100.2 F (37.9 C)] 99.6 F (37.6 C) (01/02 1153) Pulse Rate:  [106-117] 108 (01/02 1300) Resp:  [13-35] 13 (01/02 1300) BP: (73-152)/(46-101) 99/62 mmHg (01/02 1300) SpO2:  [88 %-98 %] 96 % (01/02 1300) FiO2 (%):  [40 %-70 %] 60 % (01/02 1228) HEMODYNAMICS:   VENTILATOR SETTINGS: Vent Mode:  [-] PCV FiO2 (%):  [40 %-70 %] 60 % Set Rate:  [16 bmp] 16 bmp PEEP:  [10 cmH20-12 cmH20] 10 cmH20 Plateau Pressure:  [21 cmH20-24 cmH20] 21 cmH20 INTAKE / OUTPUT:  Intake/Output Summary (Last 24 hours) at 10/23/14 1330 Last data filed at 10/23/14 1300  Gross per 24 hour  Intake   2080 ml  Output   4300 ml  Net  -2220 ml    PHYSICAL EXAMINATION: General: ill appearing Neuro:  Follows simple commands on fent gtt. Awake but calm HEENT:  ETT in place Cardiovascular:  Regular, tachycardic Lungs:  B/l rales Abdomen:  Soft, non tender Musculoskeletal:  No edema Skin:  No rashes  LABS:  PULMONARY  Recent Labs Lab 10/21/14 1539 10/21/14 1801 10/21/14 2228 10/22/14 0115 10/23/14 0404  PHART 7.330* 7.356 7.358 7.335* 7.436  PCO2ART 26.8* 29.5* 30.0* 28.9* 39.8  PO2ART 54.3* 66.8* 50.9* 67.0* 62.0*  HCO3 13.8* 16.1* 16.4* 15.4* 26.1*  TCO2 13.4 15.3 17.4 16 27.3  O2SAT 81.4 90.5 79.7 92.0 88.5    CBC  Recent Labs Lab 10/20/14 0658 10/22/14 0500 10/23/14 0419   HGB 8.0* 7.4* 7.2*  HCT 23.9* 21.9* 21.6*  WBC 8.9 30.2* 22.2*  PLT 329 360 404*    COAGULATION No results for input(s): INR in the last 168 hours.  CARDIAC    Recent Labs Lab 10/21/14 1545  TROPONINI <0.03   No results for input(s): PROBNP in the last 168 hours.   CHEMISTRY  Recent Labs Lab 10/19/14 0637 10/20/14 0658 10/21/14 0139 10/21/14 0807 10/21/14 2300 10/22/14 0500 10/23/14 0419  NA 139 136  --   --  136 136 138  K 3.8 2.9* 3.3* 3.8 4.5 4.2 3.5  CL 107 109  --   --  112 110 102  CO2 25 22  --   --  19 19 28   GLUCOSE 87 72  --   --  114* 113* 104*  BUN 5* <5*  --   --  <5* <5* <5*  CREATININE 0.53 0.60  --   --  0.77 0.73 0.90  CALCIUM 7.3* 6.5*  --   --  7.4* 7.7* 7.6*  MG  --   --  1.1* 2.2  --  1.5 1.6  PHOS  --   --   --   --   --  3.6 4.0   Estimated Creatinine Clearance: 74 mL/min (by C-G formula based on Cr of 0.9).   LIVER  Recent Labs Lab 10/18/14 0539 10/19/14 0637 10/20/14 0658 10/21/14 2300  AST 34 30 23 27   ALT 24 22 19 17   ALKPHOS 96 96 93 100  BILITOT 0.6 0.6 0.5 0.4  PROT 5.6* 5.3* 4.9* 5.1*  ALBUMIN 1.8* 1.7* 1.6* 1.8*     INFECTIOUS  Recent Labs Lab 10/21/14 1022 10/21/14 1545 10/21/14 2230 10/22/14 0500 10/23/14 0420  LATICACIDVEN 2.0  --  2.1 2.0  --   PROCALCITON  --  0.38  --  0.60 0.46     ENDOCRINE CBG (last 3)   Recent Labs  10/21/14 2203 10/22/14 1608 10/23/14 1130  GLUCAP 91 92 95         IMAGING x48h Dg Chest 1 View  10/21/2014   CLINICAL DATA:  Follow-up endotracheal tube placement.  EXAM: CHEST - 1 VIEW  COMPARISON:  Prior radiograph performed earlier on the same day.  FINDINGS: Endotracheal tube in place with tip located approximately 3.2 cm above the carina.  Severe cardiomegaly again seen. Bibasilar airspace opacities are slightly worsened, favored to reflect progressive pulmonary edema. Rapidly progressive in infection could also be considered. Right pleural effusion with  probable left pleural effusion present. No pneumothorax.  Osseous structures unchanged. Right shoulder arthroplasty partially visualized.  IMPRESSION: 1. Tip of the endotracheal tube 3.2 cm above the carina. 2. Interval worsening and bilateral airspace disease, favored to reflect progressive edema, although rapidly progressive pneumonia could also be considered. 3. Bilateral pleural effusions.   Electronically Signed   By: Jeannine Boga M.D.   On: 10/21/2014 19:40   Dg Chest 1  View  10/21/2014   CLINICAL DATA:  Shortness of breath.  Pneumonia.  EXAM: CHEST - 1 VIEW  COMPARISON:  10/17/2014  FINDINGS: Very low lung volumes compared to prior. There are increasing hazy opacity at the lung bases, left greater than right. Crowding of bronchovascular structures with mixed interstitial and alveolar perihilar opacities. Cardiac silhouette is obscured and not well evaluated. No pneumothorax. Stable appearance of the osseous structures.  IMPRESSION: Very low lung volumes with increased hazy bibasilar opacities, left greater than right. Development of perihilar mixed interstitial and alveolar opacities. CHF versus progressive pneumonia or aspiration.   Electronically Signed   By: Jeb Levering M.D.   On: 10/21/2014 15:39   Dg Chest Port 1 View  10/23/2014   CLINICAL DATA:  Acute respiratory failure  EXAM: PORTABLE CHEST - 1 VIEW  COMPARISON:  10/22/2014  FINDINGS: Endotracheal tube tip 4 cm above the carina. Nasogastric tube enters the stomach.  Bilateral airspace opacities, essentially stable on the right and increased on the left. Suspected layering right pleural effusion. Upper normal heart size. Suspected layering right pleural effusion.  Avascular necrosis, left humeral head. Prior right proximal humeral implant.  IMPRESSION: 1. Bilateral airspace opacities, increased on the left compared to the prior exam, potentially from asymmetric pulmonary edema or bilateral pneumonia. Suspected layering right pleural  effusion.   Electronically Signed   By: Sherryl Barters M.D.   On: 10/23/2014 09:15   Dg Chest Port 1 View  10/22/2014   CLINICAL DATA:  Evaluate pneumonia.  EXAM: PORTABLE CHEST - 1 VIEW  COMPARISON:  10/21/2014  FINDINGS: Endotracheal tube is unchanged. Interval placement of NG tube which appears to double back on itself into the esophagus. The tip is not well visualized but likely in the upper cervical esophagus or neck region.  Bilateral airspace opacities are again noted with slight improvement on the left. No change on the right. Bilateral pleural effusions are noted, right greater than left. The right effusion has increased slightly since prior study. Mild cardiomegaly.  IMPRESSION: Bilateral airspace opacity, right greater than left. Some improvement in the left airspace disease since prior study.  Bilateral effusions, increasing on the right.  NG tube appears to double back on itself and pass superiorly in the esophagus with the tip likely in the neck.  These results were called by telephone at the time of interpretation on 10/22/2014 at 8:59 am to patient's nurse, Lenna Sciara, who verbally acknowledged these results.   Electronically Signed   By: Rolm Baptise M.D.   On: 10/22/2014 09:00   Dg Abd Portable 1v  10/22/2014   CLINICAL DATA:  Nasogastric tube placement  EXAM: PORTABLE ABDOMEN - 1 VIEW  COMPARISON:  October 22, 2014, 6:06 a.m.  FINDINGS: The bowel gas pattern is normal. Nasogastric tube is identified with distal tip in the mid stomach. Right femoral line is unchanged. 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  IMPRESSION: Nasogastric tube with distal tip in the mid stomach.   Electronically Signed   By: Abelardo Diesel M.D.   On: 10/22/2014 10:47   Dg Abd Portable 1v  10/22/2014   CLINICAL DATA:  Nasogastric tube placement  EXAM: PORTABLE ABDOMEN - 1 VIEW  COMPARISON:  CT 05/29/2013  FINDINGS: Nasogastric tube extends to the gastric antrum. Stomach decompressed. Small bowel  decompressed. Normal distribution of gas and stool throughout the nondilated colon. Right femoral central line extending to the level of the iliac confluence. Advanced erosive changes in bilateral hips.  IMPRESSION:  1. Nasogastric tube to decompressed stomach. 2. Nonobstructed bowel gas pattern.   Electronically Signed   By: Arne Cleveland M.D.   On: 10/22/2014 09:02        ASSESSMENT / PLAN:  PULMONARY ETT 12/31 >> A: Acute respiratory failure 2nd to aspiration PNA. Hx of emphysema. Continued tobacco abuse.  1./2/16 - likely ARDS  Persists    P:   startt scheduled lasix Full vent support - if unimmproved in 24h, will have to consider ARDS NET protocol Follow CXR, ABG Scheduled BD's Hold ICS for now D/c spiriva in setting of urinary retention  CARDIOVASCULAR Rt femoral CVL ([facility] in ICU by EDP) 12/31 >> A:  Severe sepsis. Hx of HTN.   - off pressors ECHO 10/22/14 - ok. REsponded to lasix 1 on 10/22/14 P:  Monitor hemodynamics Start scheduled lasix   RENAL A:   AKI 2nd to obstructive uropathy >> improved with foley catheter insertion. Metabolic acidosis non gap likely   - hypomagnesemia and low K P:   REplete MAG and K Continue foley catheter Monitor renal fx, urine outpt F/u and replace electrolytes as needed DC  HCO3  GASTROINTESTINAL A:   Esophageal candidiasis. Diarrhea due to C diff colitis. P:   NPO Pepcid for SUP Tube feeds since 10/22/14  HEMATOLOGIC A:   Anemia of critical illness and chronic disease. P:  F/u CBC Transfuse for Hb < 7 Lovenox for DVT prevention  INFECTIOUS A:   Severe sepsis. C diff colitis. Aspiration PNA. Esophageal candidiasis. Possible UTI. Hx of HIV. - normal CD4 sept 2015  P:   Vancomycin, Doripenem 12/24 >> 12/27 Diflucan 12/27 >> Oral Vancomycin 12/27 >> Zosyn 12/31 >> Consult ID 10/22/14 >> hold antiretroviral meds for now  Blood 12/22 >> negative Urine 12/23 >> negative CSF 12/24 >> negative C diff PCR  12/27 >> POSITIVE Urine 12/27 >> negative Blood 12/27 >>  Blood 12/31 >> Urine 12/31 >> Sputum 12/31 >>  ENDOCRINE A:   No acute issues. P:   Monitor blood sugar on BMET  NEUROLOGIC A:   Acute encephalopathy 2nd to respiratory failure, sepsis, AKI. Hx of Bipolar disorder.  - follows command.  Not able to come off fent gtt now  P:   RASS goal: -1 PAD 2 sedation protocol  No family at bedside 12/3 and 10/22/14 and 10/23/14  Inter-disciplinary family meet or Palliative Care meeting due by 10/28/14:     The patient is critically ill with multiple organ systems failure and requires high complexity decision making for assessment and support, frequent evaluation and titration of therapies, application of advanced monitoring technologies and extensive interpretation of multiple databases.   Critical Care Time devoted to patient care services described in this note is  30  Minutes. This time reflects time of care of this signee Dr Brand Males. This critical care time does not reflect procedure time, or teaching time or supervisory time of PA/NP/Med student/Med Resident etc but could involve care discussion time    Dr. Brand Males, M.D., [facility].C.P Pulmonary and Critical Care Medicine Staff Physician [facility] Pager: [phone], If no answer or between  15:00h - 7:00h: call [phone]  10/23/2014 1:30 PM

## 2014-10-23 NOTE — Progress Notes (Signed)
INITIAL NUTRITION ASSESSMENT  DOCUMENTATION CODES Per approved criteria  -Not Applicable   INTERVENTION: Initiate TF via OGT with Vital AF 1.2 at 25 ml/h and Prostat 30 ml BID on day 1; on day 2, increase to goal rate of 55 ml/h (1320 ml per day) to provide 1584 kcals, 99 gm protein, 1070 ml free water daily.   NUTRITION DIAGNOSIS: Inadequate oral intake related to inability to eat as evidenced by NPO/Vent status.   Goal: Pt to meet >/= 90% of their estimated nutrition needs   Monitor:  Vent status, TF initiation/tolerance, weight trend, labs  Reason for Assessment: Consult for initiation and management of enteral nutrition  54 y.o. female  Admitting Dx: <principal problem not specified>  ASSESSMENT: 54 year old female with history of HIV and Bipolar disorder. Pt had an EGD on 09/30/14 due to N/V and difficulty swallowing. 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 She was sent to ER on 10/12/14 with vomiting blood and acute encephalopathy. She was also in acute renal failure. She developed progressive dyspnea on 10/21/14. She was treated with BiPAP and lasix, and transferred back to ICU. Her respiratory status did not improve, and she was intubated.  Per nursing notes, pt's meal completion was 0% on 12/31 and 15% the few days before. Admission weight was 145 lbs on 12/22; now 23 lbs above admission weight.   Patient is currently intubated on ventilator support MV: 9 L/min Temp (24hrs), Avg:99.7 F (37.6 C), Min:99 F (37.2 C), Max:100.2 F (37.9 C)  Propofol: none  Labs: low hemoglobin, low calcium  Height: Ht Readings from Last 1 Encounters:  10/20/14 5\' 4"  (1.626 m)    Weight: Wt Readings from Last 1 Encounters:  10/21/14 168 lb 6.9 oz (76.4 kg)  10/12/14 145 lb (66.1 kg)  Ideal Body Weight: 120 lbs  % Ideal Body Weight: 140%  Wt Readings from Last 10 Encounters:   10/21/14 168 lb 6.9 oz (76.4 kg)  10/16/14 156 lb (70.761 kg)  10/16/14 160 lb (72.576 kg)  10/13/14 146 lb (66.225 kg)  09/28/14 155 lb (70.308 kg)  09/22/14 164 lb 6.4 oz (74.571 kg)  08/17/14 169 lb (76.658 kg)  07/21/14 171 lb (77.565 kg)  03/16/14 172 lb (78.019 kg)  03/08/14 172 lb 6.4 oz (78.2 kg)    Usual Body Weight: unknown  % Usual Body Weight: NA  BMI:  Body mass index is 28.9 kg/(m^2).  Estimated Nutritional Needs: Kcal: 1610 Protein: 100-115 grams Fluid: 2.3 L/day  Skin: +1 generalized edema  Diet Order: Diet NPO time specified  EDUCATION NEEDS: -No education needs identified at this time   Intake/Output Summary (Last 24 hours) at 10/23/14 0938 Last data filed at 10/23/14 0900  Gross per 24 hour  Intake 2017.5 ml  Output   4150 ml  Net -2132.5 ml    Last BM: 12/30   Labs:   Recent Labs Lab 10/21/14 0807 10/21/14 2300 10/22/14 0500 10/23/14 0419  NA  --  136 136 138  K 3.8 4.5 4.2 3.5  CL  --  112 110 102  CO2  --  19 19 28   BUN  --  <5* <5* <5*  CREATININE  --  0.77 0.73 0.90  CALCIUM  --  7.4* 7.7* 7.6*  MG 2.2  --  1.5 1.6  PHOS  --   --  3.6 4.0  GLUCOSE  --  114* 113* 104*    CBG (last  3)   Recent Labs  10/21/14 2203 10/22/14 1608  GLUCAP 91 92    Scheduled Meds: . antiseptic oral rinse  7 mL Mouth Rinse QID  . chlorhexidine  15 mL Mouth Rinse BID  . enoxaparin (LOVENOX) injection  40 mg Subcutaneous Q24H  . famotidine (PEPCID) IV  20 mg Intravenous Q12H  . fluconazole (DIFLUCAN) IV  200 mg Intravenous Q24H  . ipratropium-albuterol  3 mL Nebulization Q4H  . nystatin-triamcinolone   Topical BID  . piperacillin-tazobactam (ZOSYN)  IV  3.375 g Intravenous 3 times per day  . vancomycin  125 mg Per Tube QID    Continuous Infusions: . fentaNYL infusion INTRAVENOUS    .  sodium bicarbonate  infusion 1000 mL 50 mL/hr at 10/21/14 2313    Past Medical History  Diagnosis Date  . Baker's cyst   . Pulmonary lesion 1/03     on RUL onn CT   . Hemorrhoids   . Zoster 2001  . Family history of diabetes mellitus   . Tobacco user   . Other abnormal Papanicolaou smear of cervix and cervical HPV(795.09)   . Herpes zoster   . Hemorrhoids   . Baker's cyst   . Pancreatitis   . HIV disease     take Prezista,Truvada,Norvir,and Isentress daily  . Bipolar disorder     With hx of psychotic features.  . Articular cartilage disorder of knee   . Bursitis of shoulder   . Rotator cuff arthropathy   . Hypertension     takes Clonidine and Verapamil daily  . Emphysema   . Asthma     Albuterol resue inhaler and QVAR daily  . Shortness of breath     with exertion and stress  . Dizziness   . Joint pain   . Joint swelling   . Chronic back pain     buldging disc  . Bruises easily   . Gastric ulcer   . GERD (gastroesophageal reflux disease)     takes Nexium daily  . Chronic constipation   . Urinary frequency   . Urinary incontinence   . Nocturia   . History of blood transfusion at age 19  . Early cataracts, bilateral   . Anxiety   . Depression     takes Xanax daily and Depakote bid  . History of shingles   . 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 Tobacco abuse 10/26/2012  . Chronic back pain   . Chronic knee pain   . Complication of anesthesia     AGGITATED  . Uterine cancer     "had some kind of cancer in my teens; had to have TAH to remove it"  . Headache(784.0)     "often here lately; weather changing"  . Pneumonia     "several times"  . Arthritis     "all over" (03/16/2014)  . Osteoarthritis of left knee 03/16/2014  . Alcohol withdrawal delirium 03/22/2014    Past Surgical History  Procedure Laterality Date  . Breast lumpectomy Right   . Breast biopsy Right ~ 1980  . Cholecystectomy  10+yrs ago  . Knee arthroscopy Left   . Finger surgery Right     rt middle finger with pin in it  . Epidural injections    . Esophagogastroduodenoscopy    . Shoulder  hemi-arthroplasty  05/06/2012    Procedure: SHOULDER HEMI-ARTHROPLASTY;  Surgeon: Johnny Bridge, MD;  Location: [facility];  Service: Orthopedics;  Laterality: Right;  . Joint  replacement    . Fracture surgery      Right finger third digit  . Appendectomy  1981    with hysterectomy  . Total knee arthroplasty Left 03/16/2014    Procedure: LEFT TOTAL KNEE ARTHROPLASTY;  Surgeon: Johnny Bridge, MD;  Location: [facility];  Service: Orthopedics;  Laterality: Left;  . Total abdominal hysterectomy  1981    w/BSO  . Esophagogastroduodenoscopy (egd) with propofol N/A 09/30/2014    Procedure: ESOPHAGOGASTRODUODENOSCOPY (EGD) WITH PROPOFOL;  Surgeon: Daneil Dolin, MD;  Location: [facility];  Service: Endoscopy;  Laterality: N/A;  . Esophageal biopsy  09/30/2014    Procedure: GASTRIC AND ESOPHAGEAL BIOPSY;  Surgeon: Daneil Dolin, MD;  Location: [facility];  Service: Endoscopy;;    Pryor Ochoa RD, LDN Inpatient Clinical Dietitian Pager: [phone] After Hours Pager: [phone]

## 2014-10-24 ENCOUNTER — Inpatient Hospital Stay (HOSPITAL_COMMUNITY): Payer: Medicare Other

## 2014-10-24 DIAGNOSIS — A047 Enterocolitis due to Clostridium difficile: Secondary | ICD-10-CM

## 2014-10-24 DIAGNOSIS — B20 Human immunodeficiency virus [HIV] disease: Secondary | ICD-10-CM | POA: Diagnosis present

## 2014-10-24 DIAGNOSIS — Z21 Asymptomatic human immunodeficiency virus [HIV] infection status: Secondary | ICD-10-CM

## 2014-10-24 LAB — BLOOD GAS, ARTERIAL
Acid-Base Excess: 10.4 mmol/L — ABNORMAL HIGH (ref 0.0–2.0)
Bicarbonate: 34.8 mEq/L — ABNORMAL HIGH (ref 20.0–24.0)
Drawn by: 39866
FIO2: 0.5 %
LHR: 16 {breaths}/min
O2 Saturation: 95.6 %
PEEP/CPAP: 10 cmH2O
PRESSURE CONTROL: 15 cmH2O
Patient temperature: 99.7
TCO2: 36.3 mmol/L (ref 0–100)
pCO2 arterial: 51 mmHg — ABNORMAL HIGH (ref 35.0–45.0)
pH, Arterial: 7.451 — ABNORMAL HIGH (ref 7.350–7.450)
pO2, Arterial: 83.9 mmHg (ref 80.0–100.0)

## 2014-10-24 LAB — URINE CULTURE: Colony Count: >=100000

## 2014-10-24 LAB — CULTURE, RESPIRATORY W GRAM STAIN

## 2014-10-24 LAB — PHOSPHORUS
Phosphorus: 3.8 mg/dL (ref 2.3–4.6)
Phosphorus: 3.8 mg/dL (ref 2.3–4.6)
Phosphorus: 2.2 mg/dL — ABNORMAL LOW (ref 2.3–4.6)

## 2014-10-24 LAB — GLUCOSE, CAPILLARY
GLUCOSE-CAPILLARY: 108 mg/dL — AB (ref 70–99)
Glucose-Capillary: 121 mg/dL — ABNORMAL HIGH (ref 70–99)
Glucose-Capillary: 125 mg/dL — ABNORMAL HIGH (ref 70–99)
Glucose-Capillary: 111 mg/dL — ABNORMAL HIGH (ref 70–99)
Glucose-Capillary: 117 mg/dL — ABNORMAL HIGH (ref 70–99)
Glucose-Capillary: 123 mg/dL — ABNORMAL HIGH (ref 70–99)

## 2014-10-24 LAB — CBC WITH DIFFERENTIAL/PLATELET
Basophils Absolute: 0 10*3/uL (ref 0.0–0.1)
Basophils Relative: 0 % (ref 0–1)
EOS ABS: 0.3 10*3/uL (ref 0.0–0.7)
EOS PCT: 3 % (ref 0–5)
HCT: 21.6 % — ABNORMAL LOW (ref 36.0–46.0)
Hemoglobin: 7.1 g/dL — ABNORMAL LOW (ref 12.0–15.0)
Lymphocytes Relative: 24 % (ref 12–46)
Lymphs Abs: 2.9 10*3/uL (ref 0.7–4.0)
MCH: 28 pg (ref 26.0–34.0)
MCHC: 32.9 g/dL (ref 30.0–36.0)
MCV: 85 fL (ref 78.0–100.0)
MONOS PCT: 5 % (ref 3–12)
Monocytes Absolute: 0.6 10*3/uL (ref 0.1–1.0)
NEUTROS PCT: 68 % (ref 43–77)
Neutro Abs: 8.4 10*3/uL — ABNORMAL HIGH (ref 1.7–7.7)
PLATELETS: 374 10*3/uL (ref 150–400)
RBC: 2.54 MIL/uL — ABNORMAL LOW (ref 3.87–5.11)
RDW: 14.4 % (ref 11.5–15.5)
WBC: 12.3 10*3/uL — ABNORMAL HIGH (ref 4.0–10.5)

## 2014-10-24 LAB — BASIC METABOLIC PANEL
ANION GAP: 5 (ref 5–15)
BUN: <5 mg/dL — ABNORMAL LOW (ref 6–23)
CO2: 36 mmol/L — ABNORMAL HIGH (ref 19–32)
CREATININE: 0.93 mg/dL (ref 0.50–1.10)
Calcium: 7.7 mg/dL — ABNORMAL LOW (ref 8.4–10.5)
Chloride: 100 mEq/L (ref 96–112)
GFR, EST AFRICAN AMERICAN: 81 mL/min — AB (ref >90)
GFR, EST NON AFRICAN AMERICAN: 70 mL/min — AB (ref >90)
Glucose, Bld: 120 mg/dL — ABNORMAL HIGH (ref 70–99)
POTASSIUM: 3.6 mmol/L (ref 3.5–5.1)
Sodium: 141 mmol/L (ref 135–145)

## 2014-10-24 LAB — MAGNESIUM
Magnesium: 1.5 mg/dL (ref 1.5–2.5)
Magnesium: 1.4 mg/dL — ABNORMAL LOW (ref 1.5–2.5)
Magnesium: 2.6 mg/dL — ABNORMAL HIGH (ref 1.5–2.5)

## 2014-10-24 MED ORDER — POTASSIUM CHLORIDE 20 MEQ/15ML (10%) PO SOLN
40.0000 meq | Freq: Once | ORAL | Status: AC
  Administered 2014-10-24: 40 meq
  Filled 2014-10-24: qty 30

## 2014-10-24 MED ORDER — MAGNESIUM SULFATE 4 GM/100ML IV SOLN
4.0000 g | Freq: Once | INTRAVENOUS | Status: AC
Start: 2014-10-24 — End: 2014-10-24
  Administered 2014-10-24: 4 g via INTRAVENOUS
  Filled 2014-10-24: qty 100

## 2014-10-24 NOTE — Progress Notes (Signed)
INFECTIOUS DISEASE PROGRESS NOTE  ID: [name] is a 54 y.o. female with  Active Problems:   Human immunodeficiency virus (HIV) disease   Essential hypertension   Bipolar 1 disorder   Acute respiratory failure with hypoxia   Renal failure   Acute renal failure   Acute encephalopathy   Sepsis   Aspiration pneumonia   Pneumonia, aspiration   Clostridium difficile colitis  Subjective: On vent, awakens transiently  Abtx:  Anti-infectives    Start     Dose/Rate Route Frequency Ordered Stop   10/22/14 1000  fluconazole (DIFLUCAN) IVPB 200 mg     200 mg100 mL/hr over 60 Minutes Intravenous Every 24 hours 10/21/14 2231     10/21/14 2300  piperacillin-tazobactam (ZOSYN) IVPB 3.375 g     3.375 g12.5 mL/hr over 240 Minutes Intravenous 3 times per day 10/21/14 2254     10/21/14 2218  vancomycin (VANCOCIN) 50 mg/mL oral solution 125 mg     125 mg Per Tube 4 times daily 10/21/14 2215 10/31/14 1359   10/21/14 1000  fluconazole (DIFLUCAN) tablet 200 mg  Status:  Discontinued     200 mg Oral Daily 10/20/14 1610 10/21/14 2215   10/17/14 1800  vancomycin (VANCOCIN) 50 mg/mL oral solution 125 mg  Status:  Discontinued     125 mg Oral 4 times daily 10/17/14 1736 10/21/14 2215   10/17/14 1400  clindamycin (CLEOCIN) capsule 300 mg  Status:  Discontinued     300 mg Oral 3 times per day 10/17/14 1022 10/17/14 1130   10/17/14 1200  fluconazole (DIFLUCAN) tablet 100 mg  Status:  Discontinued     100 mg Oral Daily 10/17/14 1152 10/20/14 1610   10/16/14 0800  ritonavir (NORVIR) tablet 100 mg  Status:  Discontinued     100 mg Oral Daily with breakfast 10/15/14 1237 10/21/14 2215   10/16/14 0800  Darunavir Ethanolate (PREZISTA) tablet 800 mg  Status:  Discontinued     800 mg Oral Daily with breakfast 10/15/14 1237 10/21/14 2215   10/15/14 1245  raltegravir (ISENTRESS) tablet 400 mg  Status:  Discontinued     400 mg Oral 2 times daily 10/15/14 1237 10/21/14 2215   10/15/14 1245   emtricitabine-tenofovir (TRUVADA) 200-300 MG per tablet 1 tablet  Status:  Discontinued     1 tablet Oral Daily 10/15/14 1237 10/21/14 2215   10/14/14 1400  doripenem (DORIBAX) 500 mg in sodium chloride 0.9 % 100 mL IVPB  Status:  Discontinued     500 mg100 mL/hr over 60 Minutes Intravenous 3 times per day 10/14/14 1040 10/17/14 1022   10/14/14 0600  vancomycin (VANCOCIN) IVPB 750 mg/150 ml premix  Status:  Discontinued     750 mg150 mL/hr over 60 Minutes Intravenous Every 12 hours 10/13/14 1822 10/17/14 1022   10/13/14 2200  piperacillin-tazobactam (ZOSYN) IVPB 3.375 g  Status:  Discontinued     3.375 g12.5 mL/hr over 240 Minutes Intravenous Every 8 hours 10/13/14 1822 10/14/14 1039   10/13/14 1900  vancomycin (VANCOCIN) 1,250 mg in sodium chloride 0.9 % 250 mL IVPB     1,250 mg166.7 mL/hr over 90 Minutes Intravenous  Once 10/13/14 1822 10/13/14 2109   10/13/14 1230  cefTRIAXone (ROCEPHIN) 1 g in dextrose 5 % 50 mL IVPB - Premix  Status:  Discontinued     1 g100 mL/hr over 30 Minutes Intravenous Every 24 hours 10/13/14 1113 10/14/14 0820      Medications:  Scheduled: . antiseptic oral rinse  7 mL  Mouth Rinse QID  . chlorhexidine  15 mL Mouth Rinse BID  . enoxaparin (LOVENOX) injection  40 mg Subcutaneous Q24H  . famotidine (PEPCID) IV  20 mg Intravenous Q12H  . fluconazole (DIFLUCAN) IV  200 mg Intravenous Q24H  . furosemide  40 mg Intravenous 3 times per day  . ipratropium-albuterol  3 mL Nebulization Q4H  . nystatin-triamcinolone   Topical BID  . piperacillin-tazobactam (ZOSYN)  IV  3.375 g Intravenous 3 times per day  . potassium chloride  40 mEq Per Tube BID  . vancomycin  125 mg Per Tube QID    Objective: Vital signs in last 24 hours: Temp:  [99.1 F (37.3 C)-99.8 F (37.7 C)] 99.1 F (37.3 C) (01/03 0741) Pulse Rate:  [102-122] 116 (01/03 0900) Resp:  [12-36] 19 (01/03 0900) BP: (85-116)/(51-85) 104/68 mmHg (01/03 0900) SpO2:  [92 %-100 %] 97 % (01/03 0900) FiO2 (%):   [45 %-60 %] 45 % (01/03 0806) Weight:  [73.1 kg (161 lb 2.5 oz)] 73.1 kg (161 lb 2.5 oz) (01/03 0500)   General appearance: mild distress Resp: rhonchi bilaterally Cardio: tachycardia GI: normal findings: soft, non-tender and abnormal findings:  distended and hypoactive bowel sounds  Lab Results  Recent Labs  10/23/14 0419 10/24/14 0510  WBC 22.2* 12.3*  HGB 7.2* 7.1*  HCT 21.6* 21.6*  NA 138 141  K 3.5 3.6  CL 102 100  CO2 28 36*  BUN <5* <5*  CREATININE 0.90 0.93   Liver Panel  Recent Labs  10/21/14 2300  PROT 5.1*  ALBUMIN 1.8*  AST 27  ALT 17  ALKPHOS 100  BILITOT 0.4   Sedimentation Rate No results for input(s): ESRSEDRATE in the last 72 hours. C-Reactive Protein No results for input(s): CRP in the last 72 hours.  Microbiology: Recent Results (from the past 240 hour(s))  CSF culture     Status: None   Collection Time: 10/14/14 10:50 AM  Result Value Ref Range Status   Specimen Description CSF  Final   Special Requests NONE  Final   Gram Stain   Final    WBC PRESENT, PREDOMINANTLY MONONUCLEAR NO ORGANISMS SEEN CYTOSPIN Performed at [facility] Performed at [place]   Final    NO GROWTH 3 DAYS Performed at [facility]    Report Status 10/17/2014 FINAL  Final  Clostridium Difficile by PCR     Status: Abnormal   Collection Time: 10/17/14  3:41 PM  Result Value Ref Range Status   C difficile by pcr POSITIVE (A) NEGATIVE Final    Comment: CRITICAL RESULT CALLED TO, READ BACK BY AND VERIFIED WITH: N.ROWE AT 9191 ON 10/17/14 BY S.VANHOORNE   Culture, blood (routine x 2)     Status: None   Collection Time: 10/17/14  5:01 PM  Result Value Ref Range Status   Specimen Description BLOOD LEFT ANTECUBITAL  Final   Special Requests BOTTLES DRAWN AEROBIC AND ANAEROBIC 6CC  Final   Culture NO GROWTH 6 DAYS  Final   Report Status 10/23/2014 FINAL  Final  Culture, Urine     Status: None   Collection Time: 10/17/14   5:33 PM  Result Value Ref Range Status   Specimen Description URINE, CATHETERIZED  Final   Special Requests NONE  Final   Colony Count NO GROWTH Performed at [facility]   Final   Culture NO GROWTH Performed at [facility]   Final   Report Status 10/19/2014  FINAL  Final  Culture, blood (routine x 2)     Status: None   Collection Time: 10/17/14  9:09 PM  Result Value Ref Range Status   Specimen Description BLOOD LEFT HAND  Final   Special Requests BOTTLES DRAWN AEROBIC ONLY 5CC  Final   Culture NO GROWTH 6 DAYS  Final   Report Status 10/23/2014 FINAL  Final  Culture, blood (routine x 2)     Status: None (Preliminary result)   Collection Time: 10/21/14  3:45 PM  Result Value Ref Range Status   Specimen Description BLOOD RIGHT ANTECUBITAL  Final   Special Requests BOTTLES DRAWN AEROBIC AND ANAEROBIC 5CC  Final   Culture NO GROWTH 2 DAYS  Final   Report Status PENDING  Incomplete    Studies/Results: Dg Chest Port 1 View  10/24/2014   CLINICAL DATA:  intubated  EXAM: PORTABLE CHEST - 1 VIEW  COMPARISON:  the previous day's study  FINDINGS: Endotracheal tube and nasogastric tube are stable in position. Mild diffuse interstitial edema or infiltrates, improved since previous exam. Persistent left retrocardiac consolidation/atelectasis. Probable small layering left effusion. Right shoulder arthroplasty partially visualized  IMPRESSION: 1. Mild bilateral edema/infiltrates, improved since previous exam. 2.  Support hardware stable in position.   Electronically Signed   By: Arne Cleveland M.D.   On: 10/24/2014 08:19   Dg Chest Port 1 View  10/23/2014   CLINICAL DATA:  Acute respiratory failure  EXAM: PORTABLE CHEST - 1 VIEW  COMPARISON:  10/22/2014  FINDINGS: Endotracheal tube tip 4 cm above the carina. Nasogastric tube enters the stomach.  Bilateral airspace opacities, essentially stable on the right and increased on the left. Suspected layering right pleural effusion. Upper  normal heart size. Suspected layering right pleural effusion.  Avascular necrosis, left humeral head. Prior right proximal humeral implant.  IMPRESSION: 1. Bilateral airspace opacities, increased on the left compared to the prior exam, potentially from asymmetric pulmonary edema or bilateral pneumonia. Suspected layering right pleural effusion.   Electronically Signed   By: Sherryl Barters M.D.   On: 10/23/2014 09:15   Dg Abd Portable 1v  10/22/2014   CLINICAL DATA:  Nasogastric tube placement  EXAM: PORTABLE ABDOMEN - 1 VIEW  COMPARISON:  October 22, 2014, 6:06 a.m.  FINDINGS: The bowel gas pattern is normal. Nasogastric tube is identified with distal tip in the mid stomach. Right femoral line is unchanged. 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  IMPRESSION: Nasogastric tube with distal tip in the mid stomach.   Electronically Signed   By: Abelardo Diesel M.D.   On: 10/22/2014 10:47     Assessment/Plan: C diff ARF due to obstructive uropathy (ditropan?) VDRF (aspiration vs fluid overload) Candida esophagitis Severe protein calorie malnutrition  Total days of antibiotics: 12 Day 8 po vanco Day 4 zosyn Day 8 fluconazole  Would consider restarting her ART when she can take PO She is afebrile, her WBC is better as is her CXR.  No BM since 12-30  Continue to watch      Bobby Rumpf Infectious Diseases (pager) [phone] [email or web address] 10/24/2014, 10:04 AM  LOS: 12 days

## 2014-10-24 NOTE — Progress Notes (Signed)
**Note De-Identified  Obfuscation** ETT advanced 2cm per MD; currently 26cm @lip 

## 2014-10-24 NOTE — Progress Notes (Signed)
PULMONARY / CRITICAL CARE MEDICINE   Name: [name] MRN: [id number] DOB: [birth year]    ADMISSION DATE:  10/21/2014  REFERRING MD :  Verneita Griffes  CHIEF COMPLAINT:  Short of breath  INITIAL PRESENTATION:  She had EGD on 09/30/14 due to  N/V and difficulty swallowing.  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  She was sent to ER on 10/12/14 with vomiting blood and acute encephalopathy.  She was also in acute renal failure with creatinine 11.6 (baseline creatinine 0.52) and with hyperkalemia and metabolic acidosis.  She had follow catheter inserted in ER at [facility], and had almost 6 liters urine outpt as soon as catheter was placed.  She was seen by nephrology on 10/13/14 and signed off 10/14/14.  She had LP done by IR on 10/14/14.  Her mental status was improving.  She was started on Abx for sepsis and possible aspiration pneumonia.  She was eventually started on diflucan for esophageal candidiasis.  She developed diarrhea and was found to be positive for C diff.  She was started on oral vancomycin.  She continued to improve, and was transferred out of ICU on 10/19/14.  She had speech therapy swallow evaluation which was normal.  She continued to fail voiding trials, and had foley catheter replaced.  She developed hypotension and dizziness, and was given fluid bolus and albumin.  She developed progressive dyspnea on 10/21/14.  She was treated with BiPAP and lasix, and transferred back to ICU.  Her respiratory status did not improve, and she was intubated.  She was then transferred to [facility] for further treatment.  .  STUDIES:  12/22 CT head >> no acute abnormalities 12/23 MRI brain >> normal for age 9/23 Renal u/s >> normal 12/24 LP ([facility]) >> protein 29, glucose 73, WBC 0, HSV DNA negative, Cryptococcal Ag negative 12/26 MRI T/L spine >> mild  degenerative disease, mild spondylosis L4-L5, L5-S1  SIGNIFICANT EVENTS: 12/10 Outpt EGD >> candidal esophagitis 12/22 admit for ARF with obstructive uropathy at [facility] 12/25 Tachycardia >> tx with diltiazem 12/27 Diarrhea 12/29 Speech therapy >> normal swallow eval 12/30 hypotension 12/31 VDRF, transfer to [facility] 10/23/14:  off pressors, On fent gtt  + prn fent  + bicarb gtt -. Follows commands. On 60%, peep 12 PCV    SUBJECTIVE/OVERNIGHT/INTERVAL HX 10/24/14: off pressors and bicarb . On PCV 40% fio2, peep 12  VITAL SIGNS: Temp:  [98.6 F (37 C)-99.8 F (37.7 C)] 98.6 F (37 C) (01/03 1159) Pulse Rate:  [104-125] 116 (01/03 1200) Resp:  [12-36] 16 (01/03 1200) BP: (85-116)/(54-85) 86/64 mmHg (01/03 1200) SpO2:  [95 %-100 %] 99 % (01/03 1200) FiO2 (%):  [45 %-60 %] 45 % (01/03 1200) Weight:  [73.1 kg (161 lb 2.5 oz)] 73.1 kg (161 lb 2.5 oz) (01/03 0500) HEMODYNAMICS:   VENTILATOR SETTINGS: Vent Mode:  [-] PCV FiO2 (%):  [45 %-60 %] 45 % Set Rate:  [16 bmp] 16 bmp PEEP:  [10 cmH20] 10 cmH20 Plateau Pressure:  [19 cmH20-27 cmH20] 22 cmH20 INTAKE / OUTPUT:  Intake/Output Summary (Last 24 hours) at 10/24/14 1503 Last data filed at 10/24/14 1200  Gross per 24 hour  Intake 1395.42 ml  Output   7050 ml  Net -5654.58 ml    PHYSICAL EXAMINATION: General: ill appearing Neuro:  Follows simple commands HEENT:  ETT in place Cardiovascular:  Regular, tachycardic Lungs:  B/l  rales Abdomen:  Soft, non tender Musculoskeletal:  No edema Skin:  No rashes  LABS:  PULMONARY  Recent Labs Lab 10/21/14 1801 10/21/14 2228 10/22/14 0115 10/23/14 0404 10/24/14 0511  PHART 7.356 7.358 7.335* 7.436 7.451*  PCO2ART 29.5* 30.0* 28.9* 39.8 51.0*  PO2ART 66.8* 50.9* 67.0* 62.0* 83.9  HCO3 16.1* 16.4* 15.4* 26.1* 34.8*  TCO2 15.3 17.4 16 27.3 36.3  O2SAT 90.5 79.7 92.0 88.5 95.6    CBC  Recent Labs Lab 10/22/14 0500 10/23/14 0419 10/24/14 0510  HGB 7.4* 7.2* 7.1*  HCT 21.9*  21.6* 21.6*  WBC 30.2* 22.2* 12.3*  PLT 360 404* 374    COAGULATION No results for input(s): INR in the last 168 hours.  CARDIAC    Recent Labs Lab 10/21/14 1545  TROPONINI <0.03   No results for input(s): PROBNP in the last 168 hours.   CHEMISTRY  Recent Labs Lab 10/20/14 0658  10/21/14 2300 10/22/14 0500 10/23/14 0419 10/23/14 2240 10/24/14 0510 10/24/14 1112  NA 136  --  136 136 138  --  141  --   K 2.9*  < > 4.5 4.2 3.5  --  3.6  --   CL 109  --  112 110 102  --  100  --   CO2 22  --  19 19 28   --  36*  --   GLUCOSE 72  --  114* 113* 104*  --  120*  --   BUN <5*  --  <5* <5* <5*  --  <5*  --   CREATININE 0.60  --  0.77 0.73 0.90  --  0.93  --   CALCIUM 6.5*  --  7.4* 7.7* 7.6*  --  7.7*  --   MG  --   < >  --  1.5 1.6 1.6 1.5 1.4*  PHOS  --   --   --  3.6 4.0 3.9 3.8 3.8  < > = values in this interval not displayed. Estimated Creatinine Clearance: 70.2 mL/min (by C-G formula based on Cr of 0.93).   LIVER  Recent Labs Lab 10/18/14 0539 10/19/14 0637 10/20/14 0658 10/21/14 2300  AST 34 30 23 27   ALT 24 22 19 17   ALKPHOS 96 96 93 100  BILITOT 0.6 0.6 0.5 0.4  PROT 5.6* 5.3* 4.9* 5.1*  ALBUMIN 1.8* 1.7* 1.6* 1.8*     INFECTIOUS  Recent Labs Lab 10/21/14 1022 10/21/14 1545 10/21/14 2230 10/22/14 0500 10/23/14 0420  LATICACIDVEN 2.0  --  2.1 2.0  --   PROCALCITON  --  0.38  --  0.60 0.46     ENDOCRINE CBG (last 3)   Recent Labs  10/24/14 0405 10/24/14 0719 10/24/14 1144  GLUCAP 121* 125* 111*         IMAGING x48h Dg Chest Port 1 View  10/24/2014   CLINICAL DATA:  intubated  EXAM: PORTABLE CHEST - 1 VIEW  COMPARISON:  the previous day's study  FINDINGS: Endotracheal tube and nasogastric tube are stable in position. Mild diffuse interstitial edema or infiltrates, improved since previous exam. Persistent left retrocardiac consolidation/atelectasis. Probable small layering left effusion. Right shoulder arthroplasty partially  visualized  IMPRESSION: 1. Mild bilateral edema/infiltrates, improved since previous exam. 2.  Support hardware stable in position.   Electronically Signed   By: Arne Cleveland M.D.   On: 10/24/2014 08:19   Dg Chest Port 1 View  10/23/2014   CLINICAL DATA:  Acute respiratory failure  EXAM: PORTABLE CHEST - 1 VIEW  COMPARISON:  10/22/2014  FINDINGS: Endotracheal tube tip 4 cm above the carina. Nasogastric tube enters the stomach.  Bilateral airspace opacities, essentially stable on the right and increased on the left. Suspected layering right pleural effusion. Upper normal heart size. Suspected layering right pleural effusion.  Avascular necrosis, left humeral head. Prior right proximal humeral implant.  IMPRESSION: 1. Bilateral airspace opacities, increased on the left compared to the prior exam, potentially from asymmetric pulmonary edema or bilateral pneumonia. Suspected layering right pleural effusion.   Electronically Signed   By: Sherryl Barters M.D.   On: 10/23/2014 09:15        ASSESSMENT / PLAN:  PULMONARY ETT 12/31 >> A: Acute respiratory failure 2nd to aspiration PNA. Hx of emphysema. Continued tobacco abuse.  1./1/16 - likely ARDS  10/24/14- improved to 40% fio2/peep 12   P:   Full vent support - change from PCV to [facility] and wean peep down Follow CXR, ABG Scheduled BD's Hold ICS for now D/c spiriva in setting of urinary retention  CARDIOVASCULAR Rt femoral CVL ([facility] in ICU by EDP) 12/31 >> A:  Severe sepsis. Hx of HTN.   - off pressors x 48h as of 10/24/14. ECHO 10/22/14 - EF 65% with trace pericardial effusion P:  Monitor hemodynamics Scheduled lasix to get her dry to wean off vent  RENAL A:   AKI 2nd to obstructive uropathy (? spiriva )  >> improved with foley catheter insertion. Metabolic acidosis non gap likely   - hypomagnesemia and low K on 10/24/14  P:   REplete MAG and K Continue foley catheter Monitor renal fx, urine outpt F/u and replace electrolytes as  needed HCO3 to IV fluid  GASTROINTESTINAL A:   Esophageal candidiasis. Diarrhea due to C diff colitis. P:   NPO Pepcid for SUP Tube feeds   HEMATOLOGIC A:   Anemia of critical illness and chronic disease. P:  F/u CBC Transfuse for Hb < 7 Lovenox for DVT prevention  INFECTIOUS  Blood 12/22 >> negative Urine 12/23 >> negative CSF 12/24 >> negative C diff PCR 12/27 >> POSITIVE Urine 12/27 >> negative Blood 12/27 >>  Blood 12/31 >> Urine 12/31 >> Sputum 12/31 >> A:   Severe sepsis. C diff colitis. Aspiration PNA. Esophageal candidiasis. Possible UTI. Hx of HIV. - normal CD4 sept 2015  P:   PER ID  Total days of antibiotics: 12    Day 8 po vanco   Day 4 zosyn   Day 8 fluconazole   ENDOCRINE A:   No acute issues. P:   Monitor blood sugar on BMET  NEUROLOGIC A:   Acute encephalopathy 2nd to respiratory failure, sepsis, AKI. Hx of Bipolar disorder.  - follows command.  Nota ble to come off fent gtt now  P:   RASS goal: -1 PAD 2 sedation protocol  No family at bedside since [facility] through 10/24/14.   Inter-disciplinary family meet or Palliative Care meeting due by 10/28/14:    The patient is critically ill with multiple organ systems failure and requires high complexity decision making for assessment and support, frequent evaluation and titration of therapies, application of advanced monitoring technologies and extensive interpretation of multiple databases.   Critical Care Time devoted to patient care services described in this note is  30 Minutes. This time reflects time of care of this signee Dr Brand Males. This critical care time does not reflect procedure time, or teaching time or supervisory time of PA/NP/Med student/Med Resident etc but could involve  care discussion time    Dr. Brand Males, M.D., [facility].C.P Pulmonary and Critical Care Medicine Staff Physician [place] [facility] Pager: [phone], If no answer or between  15:00h - 7:00h: call [phone]  10/24/2014 3:03 PM

## 2014-10-25 ENCOUNTER — Inpatient Hospital Stay (HOSPITAL_COMMUNITY): Payer: Medicare Other

## 2014-10-25 DIAGNOSIS — J189 Pneumonia, unspecified organism: Secondary | ICD-10-CM

## 2014-10-25 DIAGNOSIS — I959 Hypotension, unspecified: Secondary | ICD-10-CM

## 2014-10-25 DIAGNOSIS — N170 Acute kidney failure with tubular necrosis: Secondary | ICD-10-CM

## 2014-10-25 DIAGNOSIS — J9600 Acute respiratory failure, unspecified whether with hypoxia or hypercapnia: Secondary | ICD-10-CM | POA: Diagnosis present

## 2014-10-25 DIAGNOSIS — J810 Acute pulmonary edema: Secondary | ICD-10-CM

## 2014-10-25 DIAGNOSIS — J9601 Acute respiratory failure with hypoxia: Secondary | ICD-10-CM

## 2014-10-25 DIAGNOSIS — A498 Other bacterial infections of unspecified site: Secondary | ICD-10-CM | POA: Diagnosis present

## 2014-10-25 DIAGNOSIS — B3749 Other urogenital candidiasis: Secondary | ICD-10-CM | POA: Diagnosis present

## 2014-10-25 DIAGNOSIS — G9340 Encephalopathy, unspecified: Secondary | ICD-10-CM

## 2014-10-25 DIAGNOSIS — B3781 Candidal esophagitis: Secondary | ICD-10-CM | POA: Diagnosis present

## 2014-10-25 DIAGNOSIS — Z9911 Dependence on respirator [ventilator] status: Secondary | ICD-10-CM

## 2014-10-25 DIAGNOSIS — Y95 Nosocomial condition: Secondary | ICD-10-CM

## 2014-10-25 LAB — BASIC METABOLIC PANEL
Anion gap: 8 (ref 5–15)
BUN: <5 mg/dL — ABNORMAL LOW (ref 6–23)
CALCIUM: 8.2 mg/dL — AB (ref 8.4–10.5)
CHLORIDE: 96 meq/L (ref 96–112)
CO2: 39 mmol/L — AB (ref 19–32)
Creatinine, Ser: 1.06 mg/dL (ref 0.50–1.10)
GFR calc Af Amer: 69 mL/min — ABNORMAL LOW (ref >90)
GFR calc non Af Amer: 60 mL/min — ABNORMAL LOW (ref >90)
GLUCOSE: 117 mg/dL — AB (ref 70–99)
POTASSIUM: 4.1 mmol/L (ref 3.5–5.1)
SODIUM: 143 mmol/L (ref 135–145)

## 2014-10-25 LAB — CBC WITH DIFFERENTIAL/PLATELET
Basophils Absolute: 0 10*3/uL (ref 0.0–0.1)
Basophils Relative: 0 % (ref 0–1)
Eosinophils Absolute: 0.3 10*3/uL (ref 0.0–0.7)
Eosinophils Relative: 3 % (ref 0–5)
HEMATOCRIT: 22.6 % — AB (ref 36.0–46.0)
Hemoglobin: 7.2 g/dL — ABNORMAL LOW (ref 12.0–15.0)
Lymphocytes Relative: 32 % (ref 12–46)
Lymphs Abs: 3.1 10*3/uL (ref 0.7–4.0)
MCH: 27.3 pg (ref 26.0–34.0)
MCHC: 31.9 g/dL (ref 30.0–36.0)
MCV: 85.6 fL (ref 78.0–100.0)
Monocytes Absolute: 0.7 10*3/uL (ref 0.1–1.0)
Monocytes Relative: 7 % (ref 3–12)
Neutro Abs: 5.4 10*3/uL (ref 1.7–7.7)
Neutrophils Relative %: 58 % (ref 43–77)
PLATELETS: 391 10*3/uL (ref 150–400)
RBC: 2.64 MIL/uL — ABNORMAL LOW (ref 3.87–5.11)
RDW: 14.3 % (ref 11.5–15.5)
WBC: 9.5 10*3/uL (ref 4.0–10.5)

## 2014-10-25 LAB — PHOSPHORUS
PHOSPHORUS: 3.1 mg/dL (ref 2.3–4.6)
Phosphorus: 3.2 mg/dL (ref 2.3–4.6)

## 2014-10-25 LAB — GLUCOSE, CAPILLARY
GLUCOSE-CAPILLARY: 100 mg/dL — AB (ref 70–99)
GLUCOSE-CAPILLARY: 133 mg/dL — AB (ref 70–99)
Glucose-Capillary: 107 mg/dL — ABNORMAL HIGH (ref 70–99)
Glucose-Capillary: 113 mg/dL — ABNORMAL HIGH (ref 70–99)
Glucose-Capillary: 114 mg/dL — ABNORMAL HIGH (ref 70–99)
Glucose-Capillary: 124 mg/dL — ABNORMAL HIGH (ref 70–99)

## 2014-10-25 LAB — MAGNESIUM
MAGNESIUM: 2.2 mg/dL (ref 1.5–2.5)
MAGNESIUM: 1.9 mg/dL (ref 1.5–2.5)

## 2014-10-25 MED ORDER — FUROSEMIDE 10 MG/ML IJ SOLN
40.0000 mg | Freq: Every day | INTRAMUSCULAR | Status: DC
  Filled 2014-10-25: qty 4

## 2014-10-25 MED ORDER — DEXMEDETOMIDINE HCL IN NACL 200 MCG/50ML IV SOLN
0.4000 ug/kg/h | INTRAVENOUS | Status: DC
  Administered 2014-10-25: 0.4 ug/kg/h via INTRAVENOUS
  Administered 2014-10-25: 0.2 ug/kg/h via INTRAVENOUS
  Filled 2014-10-25 (×3): qty 50

## 2014-10-25 MED ORDER — FOSFOMYCIN TROMETHAMINE 3 G PO PACK
3.0000 g | PACK | Freq: Once | ORAL | Status: AC
  Administered 2014-10-25: 3 g via ORAL
  Filled 2014-10-25: qty 3

## 2014-10-25 MED ORDER — DARUNAVIR ETHANOLATE 800 MG PO TABS
800.0000 mg | ORAL_TABLET | Freq: Every day | ORAL | Status: DC
Start: 2014-10-26 — End: 2014-11-05
  Administered 2014-10-26 – 2014-11-05 (×11): 800 mg via NASOGASTRIC
  Filled 2014-10-25 (×13): qty 1

## 2014-10-25 MED ORDER — POLYETHYLENE GLYCOL 3350 17 G PO PACK
17.0000 g | PACK | Freq: Two times a day (BID) | ORAL | Status: DC | PRN
  Filled 2014-10-25: qty 1

## 2014-10-25 MED ORDER — ELVITEG-COBIC-EMTRICIT-TENOFAF 150-150-200-10 MG PO TABS
1.0000 | ORAL_TABLET | Freq: Every day | ORAL | Status: DC
  Administered 2014-10-26 – 2014-11-05 (×11): 1 via ORAL
  Filled 2014-10-25 (×15): qty 1

## 2014-10-25 NOTE — Progress Notes (Signed)
ANTIBIOTIC CONSULT NOTE  Pharmacy Consult for Fluconazole  Indication: Esophageal candidiasis  Allergies  Allergen Reactions  . Aspirin Nausea And Vomiting  . Sulfonamide Derivatives Hives    Patient Measurements: Height: 5\' 4"  (162.6 cm) Weight: 152 lb 12.5 oz (69.3 kg) IBW/kg (Calculated) : 54.7 Vital Signs: Temp: 98 F (36.7 C) (01/04 1151) Temp Source: Oral (01/04 1151) BP: 91/61 mmHg (01/04 1300) Pulse Rate: 97 (01/04 1300) Intake/Output from previous day: 01/03 0701 - 01/04 0700 In: 2195 [I.V.:505; NG/GT:1265; IV Piggyback:425] Out: 5200 [Urine:5200] Intake/Output from this shift: Total I/O In: 582.6 [I.V.:102.6; NG/GT:330; IV Piggyback:150] Out: 1125 [Urine:1125]  Labs:  Recent Labs  10/23/14 0419 10/24/14 0510 10/25/14 0500  WBC 22.2* 12.3* 9.5  HGB 7.2* 7.1* 7.2*  PLT 404* 374 391  CREATININE 0.90 0.93 1.06   Estimated Creatinine Clearance: 60 mL/min (by C-G formula based on Cr of 1.06). No results for input(s): VANCOTROUGH, VANCOPEAK, VANCORANDOM, GENTTROUGH, GENTPEAK, GENTRANDOM, TOBRATROUGH, TOBRAPEAK, TOBRARND, AMIKACINPEAK, AMIKACINTROU, AMIKACIN in the last 72 hours.   Microbiology: Recent Results (from the past 720 hour(s))  Urine culture     Status: None   Collection Time: 10/12/14  2:50 PM  Result Value Ref Range Status   Specimen Description URINE, CATHETERIZED  Final   Special Requests NONE  Final   Culture  Setup Time   Final    10/13/2014 00:54 Performed at [place] Performed at [facility]   Final   Culture NO GROWTH Performed at [facility]   Final   Report Status 10/13/2014 FINAL  Final  Culture, blood (routine x 2)     Status: None   Collection Time: 10/12/14  3:17 PM  Result Value Ref Range Status   Specimen Description BLOOD RIGHT HAND  Final   Special Requests BOTTLES DRAWN AEROBIC ONLY 5CC  Final   Culture NO GROWTH 5 DAYS  Final   Report Status 10/17/2014 FINAL   Final  Culture, blood (routine x 2)     Status: None   Collection Time: 10/12/14  5:00 PM  Result Value Ref Range Status   Specimen Description BLOOD RIGHT HAND  Final   Special Requests BOTTLES DRAWN AEROBIC ONLY 5CC  Final   Culture NO GROWTH 5 DAYS  Final   Report Status 10/17/2014 FINAL  Final  MRSA PCR Screening     Status: None   Collection Time: 10/12/14  8:00 PM  Result Value Ref Range Status   MRSA by PCR NEGATIVE NEGATIVE Final    Comment:        The GeneXpert MRSA Assay (FDA approved for NASAL specimens only), is one component of a comprehensive MRSA colonization surveillance program. It is not intended to diagnose MRSA infection nor to guide or monitor treatment for MRSA infections.   CSF culture     Status: None   Collection Time: 10/14/14 10:50 AM  Result Value Ref Range Status   Specimen Description CSF  Final   Special Requests NONE  Final   Gram Stain   Final    WBC PRESENT, PREDOMINANTLY MONONUCLEAR NO ORGANISMS SEEN CYTOSPIN Performed at [facility] Performed at [place]   Final    NO GROWTH 3 DAYS Performed at [facility]    Report Status 10/17/2014 FINAL  Final  Clostridium Difficile by PCR     Status: Abnormal   Collection Time: 10/17/14  3:41 PM  Result Value Ref  Range Status   C difficile by pcr POSITIVE (A) NEGATIVE Final    Comment: CRITICAL RESULT CALLED TO, READ BACK BY AND VERIFIED WITH: N.ROWE AT 7124 ON 10/17/14 BY S.VANHOORNE   Culture, blood (routine x 2)     Status: None   Collection Time: 10/17/14  5:01 PM  Result Value Ref Range Status   Specimen Description BLOOD LEFT ANTECUBITAL  Final   Special Requests BOTTLES DRAWN AEROBIC AND ANAEROBIC 6CC  Final   Culture NO GROWTH 6 DAYS  Final   Report Status 10/23/2014 FINAL  Final  Culture, Urine     Status: None   Collection Time: 10/17/14  5:33 PM  Result Value Ref Range Status   Specimen Description URINE, CATHETERIZED  Final   Special  Requests NONE  Final   Colony Count NO GROWTH Performed at [facility]   Final   Culture NO GROWTH Performed at [facility]   Final   Report Status 10/19/2014 FINAL  Final  Culture, blood (routine x 2)     Status: None   Collection Time: 10/17/14  9:09 PM  Result Value Ref Range Status   Specimen Description BLOOD LEFT HAND  Final   Special Requests BOTTLES DRAWN AEROBIC ONLY 5CC  Final   Culture NO GROWTH 6 DAYS  Final   Report Status 10/23/2014 FINAL  Final  Culture, Urine     Status: None   Collection Time: 10/21/14  3:17 PM  Result Value Ref Range Status   Specimen Description URINE, CATHETERIZED  Final   Special Requests NONE  Final   Colony Count   Final    >=100,000 COLONIES/ML Performed at [facility]    Culture   Final    PSEUDOMONAS AERUGINOSA Performed at [facility]    Report Status 10/24/2014 FINAL  Final   Organism ID, Bacteria PSEUDOMONAS AERUGINOSA  Final      Susceptibility   Pseudomonas aeruginosa - MIC*    CEFEPIME 8 SENSITIVE Sensitive     CEFTAZIDIME 16 INTERMEDIATE Intermediate     CIPROFLOXACIN 1 SENSITIVE Sensitive     GENTAMICIN 4 SENSITIVE Sensitive     IMIPENEM >=16 RESISTANT Resistant     PIP/TAZO 32 SENSITIVE Sensitive     TOBRAMYCIN <=1 SENSITIVE Sensitive     * PSEUDOMONAS AERUGINOSA  Culture, blood (routine x 2)     Status: None (Preliminary result)   Collection Time: 10/21/14  3:45 PM  Result Value Ref Range Status   Specimen Description BLOOD RIGHT ANTECUBITAL  Final   Special Requests BOTTLES DRAWN AEROBIC AND ANAEROBIC 5CC  Final   Culture NO GROWTH 4 DAYS  Final   Report Status PENDING  Incomplete  Culture, respiratory (NON-Expectorated)     Status: None   Collection Time: 10/21/14 10:51 PM  Result Value Ref Range Status   Specimen Description TRACHEAL ASPIRATE  Final   Special Requests NONE  Final   Gram Stain   Final    RARE WBC PRESENT, PREDOMINANTLY MONONUCLEAR NO SQUAMOUS EPITHELIAL  CELLS SEEN NO ORGANISMS SEEN Performed at [facility]    Culture   Final    Non-Pathogenic Oropharyngeal-type Flora Isolated. Performed at [facility]    Report Status 10/24/2014 FINAL  Final    Assessment: 54 year old female with esophageal candidiasis to receive fluconazole. Current HIV medications (darunavir, ritonavir, Truvada, Isentress) are on hold - monitor for interactions if resumed and possible need for dose adjustment. SCr 1.06, CrCl 36mL/min. Blood  cultures negative to date.  Goal of Therapy:  Clinical resolution of infection.  Plan:  -Continue fluconazole 200mg  IV daily- f/up ability to change to oral.  -Monitor for drug interactions and need to reduce dose -follow c/s, clinical progression, renal function  Gloriajean Dell, PharmD Candidate   I agree with the above plan and assessment.  Sherlon Handing, PharmD, BCPS Clinical pharmacist, pager [phone] 10/25/2014  3:00 PM

## 2014-10-25 NOTE — Progress Notes (Signed)
PULMONARY / CRITICAL CARE MEDICINE   Name: [name] MRN: [id number] DOB: December 10, 1960    ADMISSION DATE:  10/21/2014  REFERRING MD :  Verneita Griffes  CHIEF COMPLAINT:  Short of breath  INITIAL PRESENTATION:  She had EGD on 09/30/14 due to  N/V and difficulty swallowing.  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  She was sent to ER on 10/12/14 with vomiting blood and acute encephalopathy.  She was also in acute renal failure with creatinine 11.6 (baseline creatinine 0.52) and with hyperkalemia and metabolic acidosis.  She had follow catheter inserted in ER at [facility], and had almost 6 liters urine outpt as soon as catheter was placed.  She was seen by nephrology on 10/13/14 and signed off 10/14/14.  She had LP done by IR on 10/14/14.  Her mental status was improving.  She was started on Abx for sepsis and possible aspiration pneumonia.  She was eventually started on diflucan for esophageal candidiasis.  She developed diarrhea and was found to be positive for C diff.  She was started on oral vancomycin.  She continued to improve, and was transferred out of ICU on 10/19/14.  She had speech therapy swallow evaluation which was normal.  She continued to fail voiding trials, and had foley catheter replaced.  She developed hypotension and dizziness, and was given fluid bolus and albumin.  She developed progressive dyspnea on 10/21/14.  She was treated with BiPAP and lasix, and transferred back to ICU.  Her respiratory status did not improve, and she was intubated.  She was then transferred to [facility] for further treatment.  STUDIES:  12/22 CT head >> no acute abnormalities 12/23 MRI brain >> normal for age 81/23 Renal u/s >> normal 12/24 LP ([facility]) >> protein 29, glucose 73, WBC 0, HSV DNA negative, Cryptococcal Ag negative 12/26 MRI T/L spine >> mild degenerative  disease, mild spondylosis L4-L5, L5-S1 10/25/14: pCXR: interstitial opacity diffusely with left lower lobe collapse/consolidation and small effusion  SIGNIFICANT EVENTS: 12/10 Outpt EGD >> candidal esophagitis 12/22 admit for ARF with obstructive uropathy at [facility] 12/25 Tachycardia >> tx with diltiazem 12/27 Diarrhea 12/29 Speech therapy >> normal swallow eval 12/30 hypotension 12/31 VDRF, transfer to [facility] 10/23/14:  off pressors, On fent gtt  + prn fent  + bicarb gtt -. Follows commands. On 60%, peep 12 PCV 10/24/14: off pressors and bicarb . On PCV 40% fio2, peep 12  SUBJECTIVE/OVERNIGHT/INTERVAL HX 10/25/14: Tmax 101.58F overnight, down to 100.74F this morning. In and out of sleep this morning.   VITAL SIGNS: Temp:  [98.6 F (37 C)-101.9 F (38.8 C)] 100.4 F (38 C) (01/04 0410) Pulse Rate:  [103-127] 110 (01/04 0600) Resp:  [13-29] 21 (01/04 0600) BP: (86-117)/(56-87) 94/62 mmHg (01/04 0600) SpO2:  [94 %-100 %] 100 % (01/04 0600) FiO2 (%):  [40 %-50 %] 40 % (01/04 0730) Weight:  [152 lb 12.5 oz (69.3 kg)] 152 lb 12.5 oz (69.3 kg) (01/04 0500) HEMODYNAMICS:   VENTILATOR SETTINGS: Vent Mode:  [-] PRVC FiO2 (%):  [40 %-50 %] 40 % Set Rate:  [12 bmp-16 bmp] 12 bmp Vt Set:  [400 mL] 400 mL PEEP:  [8 cmH20-10 cmH20] 8 cmH20 Plateau Pressure:  [15 cmH20-22 cmH20] 18 cmH20 INTAKE / OUTPUT:  Intake/Output Summary (Last 24 hours) at 10/25/14 0734 Last data filed at 10/25/14 0600  Gross per 24 hour  Intake  2195 ml  Output   5200 ml  Net  -3005 ml   PHYSICAL EXAMINATION: General: ill appearing, on vent Neuro:  Follows simple commands but appears agitated at times, RASS +1 HEENT:  ETT in place Cardiovascular:  Tachycardic Lungs:  B/l rales, coarse breath sounds Abdomen:  Soft, diffuse tenderness to palpation, +bs Musculoskeletal:  +2 pitting edema b/l feet Skin:  Dry  LABS:  PULMONARY  Recent Labs Lab 10/21/14 1801 10/21/14 2228 10/22/14 0115 10/23/14 0404 10/24/14 0511   PHART 7.356 7.358 7.335* 7.436 7.451*  PCO2ART 29.5* 30.0* 28.9* 39.8 51.0*  PO2ART 66.8* 50.9* 67.0* 62.0* 83.9  HCO3 16.1* 16.4* 15.4* 26.1* 34.8*  TCO2 15.3 17.4 16 27.3 36.3  O2SAT 90.5 79.7 92.0 88.5 95.6   CBC  Recent Labs Lab 10/23/14 0419 10/24/14 0510 10/25/14 0500  HGB 7.2* 7.1* 7.2*  HCT 21.6* 21.6* 22.6*  WBC 22.2* 12.3* 9.5  PLT 404* 374 391   CARDIAC    Recent Labs Lab 10/21/14 1545  TROPONINI <0.03   CHEMISTRY  Recent Labs Lab 10/21/14 2300  10/22/14 0500 10/23/14 0419 10/23/14 2240 10/24/14 0510 10/24/14 1112 10/24/14 2222 10/25/14 0500  NA 136  --  136 138  --  141  --   --  143  K 4.5  --  4.2 3.5  --  3.6  --   --  4.1  CL 112  --  110 102  --  100  --   --  96  CO2 19  --  19 28  --  36*  --   --  39*  GLUCOSE 114*  --  113* 104*  --  120*  --   --  117*  BUN <5*  --  <5* <5*  --  <5*  --   --  <5*  CREATININE 0.77  --  0.73 0.90  --  0.93  --   --  1.06  CALCIUM 7.4*  --  7.7* 7.6*  --  7.7*  --   --  8.2*  MG  --   --  1.5 1.6 1.6 1.5 1.4* 2.6* 2.2  PHOS  --   < > 3.6 4.0 3.9 3.8 3.8 2.2* 3.2  < > = values in this interval not displayed. Estimated Creatinine Clearance: 60 mL/min (by C-G formula based on Cr of 1.06).  LIVER  Recent Labs Lab 10/19/14 0637 10/20/14 0658 10/21/14 2300  AST 30 23 27   ALT 22 19 17   ALKPHOS 96 93 100  BILITOT 0.6 0.5 0.4  PROT 5.3* 4.9* 5.1*  ALBUMIN 1.7* 1.6* 1.8*   INFECTIOUS  Recent Labs Lab 10/21/14 1022 10/21/14 1545 10/21/14 2230 10/22/14 0500 10/23/14 0420  LATICACIDVEN 2.0  --  2.1 2.0  --   PROCALCITON  --  0.38  --  0.60 0.46   ENDOCRINE CBG (last 3)   Recent Labs  10/24/14 1937 10/25/14 0004 10/25/14 0408  GLUCAP 123* 113* 107*   IMAGING x48h Dg Chest Port 1 View  10/25/2014   CLINICAL DATA:  54 year old female with acute respiratory failure, hypoxia. Current history of HIV, renal failure. Initial encounter.  EXAM: PORTABLE CHEST - 1 VIEW  COMPARISON:  10/24/2014 and  earlier.  FINDINGS: Portable AP semi upright view at 0451 hrs. Stable endotracheal tube. Stable visualized enteric tube, side hole the level of the proximal stomach. Stable cardiac size and mediastinal contours. Stable lung volumes. Confluent left lower lobe collapse/consolidation. Small left pleural effusion. No pneumothorax. Increased interstitial markings  elsewhere. Ventilation overall has improved since 10/23/2014, stable since yesterday. No areas of worsening ventilation. Stable visualized osseous structures including right shoulder are plastic.  IMPRESSION: 1.  Stable lines and tubes. 2. Stable ventilation since yesterday, improved since 10/23/2014. Interstitial opacity diffusely with left lower lobe collapse/consolidation and small effusion.   Electronically Signed   By: Lars Pinks M.D.   On: 10/25/2014 07:30   Dg Chest Port 1 View  10/24/2014   CLINICAL DATA:  intubated  EXAM: PORTABLE CHEST - 1 VIEW  COMPARISON:  the previous day's study  FINDINGS: Endotracheal tube and nasogastric tube are stable in position. Mild diffuse interstitial edema or infiltrates, improved since previous exam. Persistent left retrocardiac consolidation/atelectasis. Probable small layering left effusion. Right shoulder arthroplasty partially visualized  IMPRESSION: 1. Mild bilateral edema/infiltrates, improved since previous exam. 2.  Support hardware stable in position.   Electronically Signed   By: Arne Cleveland M.D.   On: 10/24/2014 08:19   ASSESSMENT / PLAN:  PULMONARY ETT 12/31 >> A: Acute respiratory failure 2nd to aspiration PNA. Hx of emphysema. Continued tobacco abuse. edema  10/22/14 - likely ARDS  10/24/14- improved to 40% fio2/peep 12 10/25/14--PRVC 400/09/29/39%; ABG 7.45/51/83.9/34.8/99.7%   P:   Full vent support - changed from PCV to PRVC, PEEP 8, decrease PEEP and wean today may be able to extubate  Wean goal cpap 5 ps 5, assess rsbi Diuresing well Follow CXR with further diuresis Neurostatus may  preclude extubation Scheduled BD's Hold ICS for now Spiriva on hold Cont neg  balance  CARDIOVASCULAR Rt femoral CVL (APH in ICU by EDP) 12/31 >> A:  Severe sepsis. Hx of HTN.   - off pressors as of 10/24/14. ECHO 10/22/14 - EF 65% with trace pericardial effusion P:  Monitor hemodynamics Scheduled lasix to get her dry to wean off vent (40mg  IV tid for now) - may need to hold lasix if BP drops further  RENAL A:   AKI 2nd to obstructive uropathy (? spiriva )  >> improved with foley catheter insertion. Metabolic acidosis non gap likely Approaching maximized lasix  P:   Continue foley catheter Reduce lasix significantly Chem in am   GASTROINTESTINAL A:   Esophageal candidiasis. Diarrhea due to C diff colitis. P:   Pepcid for SUP Tube feeds, hold if extubation appears to have a chance Avoiding ppi with cdiff  HEMATOLOGIC A:   Anemia of critical illness and chronic disease. P:  F/u CBC Transfuse for Hb < 7 Lovenox for DVT prevention - follow crt  Further, note BUN low  INFECTIOUS  Blood 12/22 >> negative Urine 12/23 >> negative CSF 12/24 >> negative C diff PCR 12/27 >> POSITIVE Urine 12/27 >> negative Blood 12/27 >>No growth Blood 12/31 >>NGTD Urine 12/31 >>Pseudomonas--resistant Imipenem, zosyn sensitive Sputum 12/31 >>Non-pathogenic oropharyngeal type flora A:   Severe sepsis. C diff colitis--on po Vanc Aspiration PNA--On Zosyn Esophageal candidiasis--On fluconazole Possible UTI--Pseudomonas on cx Hx of HIV. - normal CD4 sept 2015  P:   PER ID  Total days of antibiotics: 13    Day 9 po vanco   Day 5 zosyn   Day 9 fluconazole ID to alter zosyn to cover fosfomycin  ENDOCRINE A:   No acute issues.  P:   Monitor blood sugar on BMET  NEUROLOGIC A:   Acute encephalopathy 2nd to respiratory failure, sepsis, AKI. Hx of Bipolar disorder.  - following some commands with intermittent agitation  P:   Wean off fentanyl WUA  likely cam pos,  may need  addition haldol, Risperdal?  No family at bedside since [facility] through 10/24/14.   Inter-disciplinary family meet or Palliative Care meeting due by 10/28/14:   Signed: Jerene Pitch, MD PGY-3, Internal Medicine Resident Pager: [phone]  10/25/2014,7:44 AM   STAFF NOTE: Linwood Dibbles, MD FACP have personally reviewed patient's available data, including medical history, events of note, physical examination and test results as part of my evaluation. I have discussed with resident/NP and other care providers such as pharmacist, RN and RRT. In addition, I personally evaluated patient and elicited key findings of: peep to 5 then cpap 5 ps 5 goal 1 hr, assess rsbi, lasix reduction, ABX change, upright, may need addition haldol, pcxr major improved, assess airway protection skills further The patient is critically ill with multiple organ systems failure and requires high complexity decision making for assessment and support, frequent evaluation and titration of therapies, application of advanced monitoring technologies and extensive interpretation of multiple databases.   Critical Care Time devoted to patient care services described in this note is30 Minutes. This time reflects time of care of this signee: Merrie Roof, MD FACP. This critical care time does not reflect procedure time, or teaching time or supervisory time of PA/NP/Med student/Med Resident etc but could involve care discussion time. Rest per NP/medical resident whose note is outlined above and that I agree with   Lavon Paganini. Titus Mould, MD, [place] Pgr: [place] [facility] 10/25/2014 8:56 AM

## 2014-10-25 NOTE — Progress Notes (Signed)
[facility]    Subjective: Intubated but moving extremities nodding head   Antibiotics:  Anti-infectives    Start     Dose/Rate Route Frequency Ordered Stop   10/22/14 1000  fluconazole (DIFLUCAN) IVPB 200 mg     200 mg100 mL/hr over 60 Minutes Intravenous Every 24 hours 10/21/14 2231     10/21/14 2300  piperacillin-tazobactam (ZOSYN) IVPB 3.375 g  Status:  Discontinued     3.375 g12.5 mL/hr over 240 Minutes Intravenous 3 times per day 10/21/14 2254 10/25/14 0843   10/21/14 2218  vancomycin (VANCOCIN) 50 mg/mL oral solution 125 mg     125 mg Per Tube 4 times daily 10/21/14 2215 10/31/14 1359   10/21/14 1000  fluconazole (DIFLUCAN) tablet 200 mg  Status:  Discontinued     200 mg Oral Daily 10/20/14 1610 10/21/14 2215   10/17/14 1800  vancomycin (VANCOCIN) 50 mg/mL oral solution 125 mg  Status:  Discontinued     125 mg Oral 4 times daily 10/17/14 1736 10/21/14 2215   10/17/14 1400  clindamycin (CLEOCIN) capsule 300 mg  Status:  Discontinued     300 mg Oral 3 times per day 10/17/14 1022 10/17/14 1130   10/17/14 1200  fluconazole (DIFLUCAN) tablet 100 mg  Status:  Discontinued     100 mg Oral Daily 10/17/14 1152 10/20/14 1610   10/16/14 0800  ritonavir (NORVIR) tablet 100 mg  Status:  Discontinued     100 mg Oral Daily with breakfast 10/15/14 1237 10/21/14 2215   10/16/14 0800  Darunavir Ethanolate (PREZISTA) tablet 800 mg  Status:  Discontinued     800 mg Oral Daily with breakfast 10/15/14 1237 10/21/14 2215   10/15/14 1245  raltegravir (ISENTRESS) tablet 400 mg  Status:  Discontinued     400 mg Oral 2 times daily 10/15/14 1237 10/21/14 2215   10/15/14 1245  emtricitabine-tenofovir (TRUVADA) 200-300 MG per tablet 1 tablet  Status:  Discontinued     1 tablet Oral Daily 10/15/14 1237 10/21/14 2215   10/14/14 1400  doripenem (DORIBAX) 500 mg in sodium chloride 0.9 % 100 mL IVPB  Status:  Discontinued     500 mg100 mL/hr over 60 Minutes Intravenous 3 times  per day 10/14/14 1040 10/17/14 1022   10/14/14 0600  vancomycin (VANCOCIN) IVPB 750 mg/150 ml premix  Status:  Discontinued     750 mg150 mL/hr over 60 Minutes Intravenous Every 12 hours 10/13/14 1822 10/17/14 1022   10/13/14 2200  piperacillin-tazobactam (ZOSYN) IVPB 3.375 g  Status:  Discontinued     3.375 g12.5 mL/hr over 240 Minutes Intravenous Every 8 hours 10/13/14 1822 10/14/14 1039   10/13/14 1900  vancomycin (VANCOCIN) 1,250 mg in sodium chloride 0.9 % 250 mL IVPB     1,250 mg166.7 mL/hr over 90 Minutes Intravenous  Once 10/13/14 1822 10/13/14 2109   10/13/14 1230  cefTRIAXone (ROCEPHIN) 1 g in dextrose 5 % 50 mL IVPB - Premix  Status:  Discontinued     1 g100 mL/hr over 30 Minutes Intravenous Every 24 hours 10/13/14 1113 10/14/14 0820      Medications: Scheduled Meds: . antiseptic oral rinse  7 mL Mouth Rinse QID  . chlorhexidine  15 mL Mouth Rinse BID  . enoxaparin (LOVENOX) injection  40 mg Subcutaneous Q24H  . famotidine (PEPCID) IV  20 mg Intravenous Q12H  . fluconazole (DIFLUCAN) IV  200 mg Intravenous Q24H  . furosemide  40 mg Intravenous 3 times per day  .  ipratropium-albuterol  3 mL Nebulization Q4H  . nystatin-triamcinolone   Topical BID  . potassium chloride  40 mEq Per Tube BID  . vancomycin  125 mg Per Tube QID   Continuous Infusions: . dexmedetomidine 0.4 mcg/kg/hr (10/25/14 1112)  . feeding supplement (VITAL AF 1.2 CAL) 1,000 mL (10/25/14 0800)  . fentaNYL infusion INTRAVENOUS Stopped (10/25/14 0827)   PRN Meds:.fentaNYL, midazolam, naphazoline    Objective: Weight change: -8 lb 6 oz (-3.8 kg)  Intake/Output Summary (Last 24 hours) at 10/25/14 1136 Last data filed at 10/25/14 1000  Gross per 24 hour  Intake   2104 ml  Output   4675 ml  Net  -2571 ml   Blood pressure 104/66, pulse 114, temperature 99 F (37.2 C), temperature source Oral, resp. rate 19, height 5\' 4"  (1.626 m), weight 152 lb 12.5 oz (69.3 kg), SpO2 100 %. Temp:  [98.6 F (37  C)-101.9 F (38.8 C)] 99 F (37.2 C) (01/04 0804) Pulse Rate:  [103-127] 114 (01/04 1000) Resp:  [13-25] 19 (01/04 1000) BP: (83-117)/(54-87) 104/66 mmHg (01/04 1000) SpO2:  [94 %-100 %] 100 % (01/04 1000) FiO2 (%):  [40 %-45 %] 40 % (01/04 1102) Weight:  [152 lb 12.5 oz (69.3 kg)] 152 lb 12.5 oz (69.3 kg) (01/04 0500)  Physical Exam: General: Intubated but awake and following commands in restraints HEENT: anicteric sclera, , EOMI CVS tachcardic rate ,  no murmur rubs or gallops Chest: fairly clear to auscultation bilaterally, no wheezing, rales or rhonchi Abdomen: soft and diffusely tender Extremities: no  clubbing or edema noted bilaterally Neuro: nonfocal  CBC:  CBC Latest Ref Rng 10/25/2014 10/24/2014 10/23/2014  WBC 4.0 - 10.5 K/uL 9.5 12.3(H) 22.2(H)  Hemoglobin 12.0 - 15.0 g/dL 7.2(L) 7.1(L) 7.2(L)  Hematocrit 36.0 - 46.0 % 22.6(L) 21.6(L) 21.6(L)  Platelets 150 - 400 K/uL 391 374 404(H)      BMET  Recent Labs  10/24/14 0510 10/25/14 0500  NA 141 143  K 3.6 4.1  CL 100 96  CO2 36* 39*  GLUCOSE 120* 117*  BUN <5* <5*  CREATININE 0.93 1.06  CALCIUM 7.7* 8.2*     Liver Panel  No results for input(s): PROT, ALBUMIN, AST, ALT, ALKPHOS, BILITOT, BILIDIR, IBILI in the last 72 hours.     Sedimentation Rate No results for input(s): ESRSEDRATE in the last 72 hours. C-Reactive Protein No results for input(s): CRP in the last 72 hours.  Micro Results: Recent Results (from the past 720 hour(s))  Urine culture     Status: None   Collection Time: 10/12/14  2:50 PM  Result Value Ref Range Status   Specimen Description URINE, CATHETERIZED  Final   Special Requests NONE  Final   Culture  Setup Time   Final    10/13/2014 00:54 Performed at [place] Performed at [facility]   Final   Culture NO GROWTH Performed at [facility]   Final   Report Status 10/13/2014 FINAL  Final  Culture, blood (routine x 2)      Status: None   Collection Time: 10/12/14  3:17 PM  Result Value Ref Range Status   Specimen Description BLOOD RIGHT HAND  Final   Special Requests BOTTLES DRAWN AEROBIC ONLY 5CC  Final   Culture NO GROWTH 5 DAYS  Final   Report Status 10/17/2014 FINAL  Final  Culture, blood (routine x 2)     Status: None   Collection Time:  10/12/14  5:00 PM  Result Value Ref Range Status   Specimen Description BLOOD RIGHT HAND  Final   Special Requests BOTTLES DRAWN AEROBIC ONLY 5CC  Final   Culture NO GROWTH 5 DAYS  Final   Report Status 10/17/2014 FINAL  Final  MRSA PCR Screening     Status: None   Collection Time: 10/12/14  8:00 PM  Result Value Ref Range Status   MRSA by PCR NEGATIVE NEGATIVE Final    Comment:        The GeneXpert MRSA Assay (FDA approved for NASAL specimens only), is one component of a comprehensive MRSA colonization surveillance program. It is not intended to diagnose MRSA infection nor to guide or monitor treatment for MRSA infections.   CSF culture     Status: None   Collection Time: 10/14/14 10:50 AM  Result Value Ref Range Status   Specimen Description CSF  Final   Special Requests NONE  Final   Gram Stain   Final    WBC PRESENT, PREDOMINANTLY MONONUCLEAR NO ORGANISMS SEEN CYTOSPIN Performed at [facility] Performed at [place]   Final    NO GROWTH 3 DAYS Performed at [facility]    Report Status 10/17/2014 FINAL  Final  Clostridium Difficile by PCR     Status: Abnormal   Collection Time: 10/17/14  3:41 PM  Result Value Ref Range Status   C difficile by pcr POSITIVE (A) NEGATIVE Final    Comment: CRITICAL RESULT CALLED TO, READ BACK BY AND VERIFIED WITH: N.ROWE AT 5732 ON 10/17/14 BY S.VANHOORNE   Culture, blood (routine x 2)     Status: None   Collection Time: 10/17/14  5:01 PM  Result Value Ref Range Status   Specimen Description BLOOD LEFT ANTECUBITAL  Final   Special Requests BOTTLES DRAWN AEROBIC AND  ANAEROBIC 6CC  Final   Culture NO GROWTH 6 DAYS  Final   Report Status 10/23/2014 FINAL  Final  Culture, Urine     Status: None   Collection Time: 10/17/14  5:33 PM  Result Value Ref Range Status   Specimen Description URINE, CATHETERIZED  Final   Special Requests NONE  Final   Colony Count NO GROWTH Performed at [facility]   Final   Culture NO GROWTH Performed at [facility]   Final   Report Status 10/19/2014 FINAL  Final  Culture, blood (routine x 2)     Status: None   Collection Time: 10/17/14  9:09 PM  Result Value Ref Range Status   Specimen Description BLOOD LEFT HAND  Final   Special Requests BOTTLES DRAWN AEROBIC ONLY 5CC  Final   Culture NO GROWTH 6 DAYS  Final   Report Status 10/23/2014 FINAL  Final  Culture, Urine     Status: None   Collection Time: 10/21/14  3:17 PM  Result Value Ref Range Status   Specimen Description URINE, CATHETERIZED  Final   Special Requests NONE  Final   Colony Count   Final    >=100,000 COLONIES/ML Performed at [facility]    Culture   Final    PSEUDOMONAS AERUGINOSA Performed at [facility]    Report Status 10/24/2014 FINAL  Final   Organism ID, Bacteria PSEUDOMONAS AERUGINOSA  Final      Susceptibility   Pseudomonas aeruginosa - MIC*    CEFEPIME 8 SENSITIVE Sensitive     CEFTAZIDIME 16 INTERMEDIATE Intermediate     CIPROFLOXACIN  1 SENSITIVE Sensitive     GENTAMICIN 4 SENSITIVE Sensitive     IMIPENEM >=16 RESISTANT Resistant     PIP/TAZO 32 SENSITIVE Sensitive     TOBRAMYCIN <=1 SENSITIVE Sensitive     * PSEUDOMONAS AERUGINOSA  Culture, blood (routine x 2)     Status: None (Preliminary result)   Collection Time: 10/21/14  3:45 PM  Result Value Ref Range Status   Specimen Description BLOOD RIGHT ANTECUBITAL  Final   Special Requests BOTTLES DRAWN AEROBIC AND ANAEROBIC 5CC  Final   Culture NO GROWTH 4 DAYS  Final   Report Status PENDING  Incomplete  Culture, respiratory (NON-Expectorated)      Status: None   Collection Time: 10/21/14 10:51 PM  Result Value Ref Range Status   Specimen Description TRACHEAL ASPIRATE  Final   Special Requests NONE  Final   Gram Stain   Final    RARE WBC PRESENT, PREDOMINANTLY MONONUCLEAR NO SQUAMOUS EPITHELIAL CELLS SEEN NO ORGANISMS SEEN Performed at [facility]    Culture   Final    Non-Pathogenic Oropharyngeal-type Flora Isolated. Performed at [facility]    Report Status 10/24/2014 FINAL  Final    Studies/Results: Dg Chest Port 1 View  10/25/2014   CLINICAL DATA:  54 year old female with acute respiratory failure, hypoxia. Current history of HIV, renal failure. Initial encounter.  EXAM: PORTABLE CHEST - 1 VIEW  COMPARISON:  10/24/2014 and earlier.  FINDINGS: Portable AP semi upright view at 0451 hrs. Stable endotracheal tube. Stable visualized enteric tube, side hole the level of the proximal stomach. Stable cardiac size and mediastinal contours. Stable lung volumes. Confluent left lower lobe collapse/consolidation. Small left pleural effusion. No pneumothorax. Increased interstitial markings elsewhere. Ventilation overall has improved since 10/23/2014, stable since yesterday. No areas of worsening ventilation. Stable visualized osseous structures including right shoulder are plastic.  IMPRESSION: 1.  Stable lines and tubes. 2. Stable ventilation since yesterday, improved since 10/23/2014. Interstitial opacity diffusely with left lower lobe collapse/consolidation and small effusion.   Electronically Signed   By: Lars Pinks M.D.   On: 10/25/2014 07:30   Dg Chest Port 1 View  10/24/2014   CLINICAL DATA:  intubated  EXAM: PORTABLE CHEST - 1 VIEW  COMPARISON:  the previous day's study  FINDINGS: Endotracheal tube and nasogastric tube are stable in position. Mild diffuse interstitial edema or infiltrates, improved since previous exam. Persistent left retrocardiac consolidation/atelectasis. Probable small layering left effusion. Right  shoulder arthroplasty partially visualized  IMPRESSION: 1. Mild bilateral edema/infiltrates, improved since previous exam. 2.  Support hardware stable in position.   Electronically Signed   By: Arne Cleveland M.D.   On: 10/24/2014 08:19      Assessment/Plan:  Active Problems:   Human immunodeficiency virus (HIV) disease   Essential hypertension   Bipolar 1 disorder   Acute respiratory failure with hypoxia   Renal failure   Acute renal failure   Acute encephalopathy   Sepsis   Aspiration pneumonia   Pneumonia, aspiration   Clostridium difficile colitis   HIV (human immunodeficiency virus infection)    [name] is a 54 y.o. female with  Hx of HIV, bipolar disease, admitted with AKI from structured uropathy then given antibiotics for possible healthcare associated pneumonia who then developed C. difficile colitis than hypotension currently being treated with Zosyn for possible healthcare associated pneumonia though she may have more likley pulm edema as well as oral vancomycin for C. difficile colitis and fluconazole for candidal esophagitis. Her urine  cultures have grown Pseudomonas aeruginosa she remains on the ventlator   #1 Clostridium difficile colitis: Continue oral vancomycin, avoid other on necessary antibiotics as well as proton pump inhibitors  #2 possible HCAP: I favor this as being due to pulm edema . Resp cultures also unrevealing and pt has had several days of systemic abx with zosyn  #3 Pseudomonas in urine: she has pyuria, ? Ssx. I will switch her to fosfomycin which will ONLY act at level of bladder and with no systemic drug less impact on her CDI  #4 HIV: if she has feeding tube can restart her ARVs. Could consider consolidate to [place] and Prezista (safer TNF in this regimen ) vs her old regimen of isentress, PRezista, Norvir and Truvada, Will touch base with Dr. Johnnye Sima her PCP first    LOS: 13 days   Alcide Evener 10/25/2014, 11:36 AM

## 2014-10-25 NOTE — Progress Notes (Signed)
50cc of fentanyl drip wasted with Livia Snellen RN.

## 2014-10-26 ENCOUNTER — Inpatient Hospital Stay (HOSPITAL_COMMUNITY): Payer: Medicare Other

## 2014-10-26 DIAGNOSIS — B965 Pseudomonas (aeruginosa) (mallei) (pseudomallei) as the cause of diseases classified elsewhere: Secondary | ICD-10-CM

## 2014-10-26 DIAGNOSIS — N390 Urinary tract infection, site not specified: Secondary | ICD-10-CM | POA: Diagnosis present

## 2014-10-26 DIAGNOSIS — R339 Retention of urine, unspecified: Secondary | ICD-10-CM | POA: Diagnosis present

## 2014-10-26 DIAGNOSIS — R140 Abdominal distension (gaseous): Secondary | ICD-10-CM

## 2014-10-26 DIAGNOSIS — R4182 Altered mental status, unspecified: Secondary | ICD-10-CM | POA: Diagnosis present

## 2014-10-26 DIAGNOSIS — R4020 Unspecified coma: Secondary | ICD-10-CM

## 2014-10-26 LAB — BLOOD GAS, ARTERIAL
Acid-Base Excess: 8.8 mmol/L — ABNORMAL HIGH (ref 0.0–2.0)
Bicarbonate: 32.9 mEq/L — ABNORMAL HIGH (ref 20.0–24.0)
Drawn by: 398661
FIO2: 0.4 %
MECHVT: 400 mL
O2 Saturation: 94.8 %
PATIENT TEMPERATURE: 98.2
PEEP/CPAP: 5 cmH2O
PH ART: 7.472 — AB (ref 7.350–7.450)
PO2 ART: 74.6 mmHg — AB (ref 80.0–100.0)
RATE: 12 resp/min
TCO2: 34.3 mmol/L (ref 0–100)
pCO2 arterial: 45.4 mmHg — ABNORMAL HIGH (ref 35.0–45.0)

## 2014-10-26 LAB — CBC WITH DIFFERENTIAL/PLATELET
BASOS ABS: 0 10*3/uL (ref 0.0–0.1)
Basophils Relative: 1 % (ref 0–1)
EOS PCT: 4 % (ref 0–5)
Eosinophils Absolute: 0.4 10*3/uL (ref 0.0–0.7)
HCT: 22.1 % — ABNORMAL LOW (ref 36.0–46.0)
Hemoglobin: 7 g/dL — ABNORMAL LOW (ref 12.0–15.0)
LYMPHS ABS: 3.9 10*3/uL (ref 0.7–4.0)
LYMPHS PCT: 45 % (ref 12–46)
MCH: 27.5 pg (ref 26.0–34.0)
MCHC: 31.7 g/dL (ref 30.0–36.0)
MCV: 86.7 fL (ref 78.0–100.0)
Monocytes Absolute: 0.6 10*3/uL (ref 0.1–1.0)
Monocytes Relative: 7 % (ref 3–12)
NEUTROS ABS: 3.7 10*3/uL (ref 1.7–7.7)
NEUTROS PCT: 43 % (ref 43–77)
PLATELETS: 374 10*3/uL (ref 150–400)
RBC: 2.55 MIL/uL — ABNORMAL LOW (ref 3.87–5.11)
RDW: 14.7 % (ref 11.5–15.5)
WBC: 8.6 10*3/uL (ref 4.0–10.5)

## 2014-10-26 LAB — GLUCOSE, CAPILLARY
GLUCOSE-CAPILLARY: 91 mg/dL (ref 70–99)
GLUCOSE-CAPILLARY: 90 mg/dL (ref 70–99)
GLUCOSE-CAPILLARY: 88 mg/dL (ref 70–99)
Glucose-Capillary: 100 mg/dL — ABNORMAL HIGH (ref 70–99)
Glucose-Capillary: 114 mg/dL — ABNORMAL HIGH (ref 70–99)
Glucose-Capillary: 116 mg/dL — ABNORMAL HIGH (ref 70–99)

## 2014-10-26 LAB — BASIC METABOLIC PANEL
ANION GAP: 8 (ref 5–15)
BUN: <5 mg/dL — ABNORMAL LOW (ref 6–23)
CALCIUM: 8.2 mg/dL — AB (ref 8.4–10.5)
CO2: 33 mmol/L — AB (ref 19–32)
CREATININE: 0.92 mg/dL (ref 0.50–1.10)
Chloride: 104 mEq/L (ref 96–112)
GFR calc non Af Amer: 71 mL/min — ABNORMAL LOW (ref >90)
GFR, EST AFRICAN AMERICAN: 82 mL/min — AB (ref >90)
Glucose, Bld: 118 mg/dL — ABNORMAL HIGH (ref 70–99)
Potassium: 4.1 mmol/L (ref 3.5–5.1)
SODIUM: 145 mmol/L (ref 135–145)

## 2014-10-26 LAB — CULTURE, BLOOD (ROUTINE X 2): CULTURE: NO GROWTH

## 2014-10-26 LAB — PHOSPHORUS: Phosphorus: 2.7 mg/dL (ref 2.3–4.6)

## 2014-10-26 LAB — MAGNESIUM
MAGNESIUM: 1.6 mg/dL (ref 1.5–2.5)
Magnesium: 1.6 mg/dL (ref 1.5–2.5)

## 2014-10-26 MED ORDER — CHLORHEXIDINE GLUCONATE 0.12 % MT SOLN
15.0000 mL | Freq: Two times a day (BID) | OROMUCOSAL | Status: DC
  Administered 2014-10-26 – 2014-10-27 (×3): 15 mL via OROMUCOSAL
  Filled 2014-10-26 (×2): qty 15

## 2014-10-26 MED ORDER — CETYLPYRIDINIUM CHLORIDE 0.05 % MT LIQD
7.0000 mL | Freq: Two times a day (BID) | OROMUCOSAL | Status: DC
  Administered 2014-10-27 – 2014-11-05 (×12): 7 mL via OROMUCOSAL

## 2014-10-26 MED ORDER — MAGNESIUM SULFATE 2 GM/50ML IV SOLN: 2.0000 g | Freq: Once | INTRAVENOUS | Status: DC

## 2014-10-26 MED ORDER — FUROSEMIDE 10 MG/ML IJ SOLN
20.0000 mg | Freq: Once | INTRAMUSCULAR | Status: AC
  Administered 2014-10-26: 20 mg via INTRAVENOUS
  Filled 2014-10-26: qty 2

## 2014-10-26 MED ORDER — MAGNESIUM SULFATE 2 GM/50ML IV SOLN
2.0000 g | Freq: Once | INTRAVENOUS | Status: AC
  Administered 2014-10-26: 2 g via INTRAVENOUS
  Filled 2014-10-26: qty 50

## 2014-10-26 NOTE — Progress Notes (Signed)
[facility] ADULT ICU REPLACEMENT PROTOCOL FOR AM LAB REPLACEMENT ONLY  The patient does apply for the [facility] Adult ICU Electrolyte Replacment Protocol based on the criteria listed below:   1. Is GFR >/= 40 ml/min? Yes.    Patient's GFR today is 82  2. Is urine output >/= 0.5 ml/kg/hr for the last 6 hours? Yes.   Patient's UOP is 0.6 ml/kg/hr 3. Is BUN < 60 mg/dL? Yes.    Patient's BUN today is <5 4. Abnormal electrolyte(s): Mg 1.6 5. Ordered repletion with: per protocol 6. If a panic level lab has been reported, has the CCM MD in charge been notified? No..   Physician:    Ronda Fairly A 10/26/2014 6:24 AM

## 2014-10-26 NOTE — Procedures (Signed)
Extubation Procedure Note  Patient Details:   Name: [name] DOB: 07-14-1961 MRN: [id number]   Airway Documentation:     Evaluation  O2 sats: stable throughout Complications: No apparent complications Patient did tolerate procedure well. Bilateral Breath Sounds: Rhonchi Suctioning: Airway Yes  Pt able to vocalize.  Pt extubated at this time per MD order. Pt tolerated well. No complications. Pt able to breathe around deflated cuff. VS stable. Pt was placed on 4L [place]. Pt able to follow some commands, but slightly confused, in which she will not perform IS or cough on command. RN tried as well with no success. No stridor noted. RT will continue to monitor.     Irineo Axon [facility] 10/26/2014, 11:38 AM

## 2014-10-26 NOTE — Progress Notes (Signed)
PULMONARY / CRITICAL CARE MEDICINE   Name: [name] MRN: [id number] DOB: 1961-01-16    ADMISSION DATE:  10/21/2014  REFERRING MD :  Verneita Griffes  CHIEF COMPLAINT:  Short of breath  INITIAL PRESENTATION:  EGD on 09/30/14 due to  N/V and difficulty swallowing.  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  She was sent to ER on 10/12/14 with vomiting blood and acute encephalopathy.  She was also in acute renal failure with creatinine 11.6 (baseline creatinine 0.52) and with hyperkalemia and metabolic acidosis. Foley catheter inserted in ER at [facility], and had almost 6 liters urine outpt as soon as catheter was placed.  Seen by nephrology on 10/13/14 and signed off 10/14/14.  She had LP done by IR on 10/14/14.  Her mental status was improving.  She was started on Abx for sepsis and possible aspiration pneumonia and eventually started on diflucan for esophageal candidiasis.  She developed diarrhea and was found to be positive for C diff and started on oral vancomycin.  She continued to improve, and was transferred out of ICU on 10/19/14.  She had speech therapy swallow evaluation which was normal.  She continued to fail voiding trials, and had foley catheter replaced.  She developed hypotension and dizziness, and was given fluid bolus and albumin.  She developed progressive dyspnea on 10/21/14.  She was treated with BiPAP and lasix, and transferred back to ICU.  Her respiratory status did not improve, and she was intubated.  She was then transferred to [facility] for further treatment.  STUDIES:  12/22 CT head >> no acute abnormalities 12/23 MRI brain >> normal for age 6/23 Renal u/s >> normal 12/24 LP ([facility]) >> protein 29, glucose 73, WBC 0, HSV DNA negative, Cryptococcal Ag negative 12/26 MRI T/L spine >> mild degenerative disease, mild spondylosis L4-L5,  L5-S1 10/25/14: pCXR: interstitial opacity diffusely with left lower lobe collapse/consolidation and small effusion  SIGNIFICANT EVENTS: 12/10 Outpt EGD >> candidal esophagitis 12/22 admit for ARF with obstructive uropathy at [facility] 12/25 Tachycardia >> tx with diltiazem 12/27 Diarrhea 12/29 Speech therapy >> normal swallow eval 12/30 hypotension 12/31 VDRF, transfer to [facility] 10/23/14:  off pressors, On fent gtt  + prn fent  + bicarb gtt -. Follows commands. On 60%, peep 12 PCV 10/24/14: off pressors and bicarb . On PCV 40% fio2, peep 12 10/25/14: Tmax 101.22F overnight, precedex gtt for agitation  SUBJECTIVE/OVERNIGHT/INTERVAL HX 1/5: Afebrile overnight, hypotensive this morning, more arousable but still sleepy. Off precedex  VITAL SIGNS: Temp:  [98 F (36.7 C)-99.8 F (37.7 C)] 98.2 F (36.8 C) (01/05 0408) Pulse Rate:  [73-121] 82 (01/05 0700) Resp:  [12-39] 17 (01/05 0700) BP: (73-114)/(50-72) 88/59 mmHg (01/05 0700) SpO2:  [95 %-100 %] 100 % (01/05 0700) FiO2 (%):  [40 %] 40 % (01/05 0433) Weight:  [153 lb 14.1 oz (69.8 kg)] 153 lb 14.1 oz (69.8 kg) (01/05 0458) HEMODYNAMICS:   VENTILATOR SETTINGS: Vent Mode:  [-] PRVC FiO2 (%):  [40 %] 40 % Set Rate:  [12 bmp] 12 bmp Vt Set:  [400 mL] 400 mL PEEP:  [5 cmH20-8 cmH20] 5 cmH20 Pressure Support:  [8 cmH20] 8 cmH20 Plateau Pressure:  [12 GBT51-76 cmH20] 12 cmH20 INTAKE / OUTPUT:  Intake/Output Summary (Last 24 hours) at 10/26/14 0719 Last data filed at 10/26/14 0600  Gross per 24 hour  Intake 1787.88 ml  Output  2400 ml  Net -612.12 ml   PHYSICAL EXAMINATION: General: lying in bed, arousable, on vent Neuro:  Follows simple commands, opens eyes to voice, lethargic HEENT:  ETT in place Cardiovascular:  Tachycardic Lungs:  Coarse breath sounds Abdomen:  Soft, diffuse tenderness to palpation, +bs Musculoskeletal:  Moving extremities, edematous feet Skin:  Dry  LABS:  PULMONARY  Recent Labs Lab 10/21/14 2228  10/22/14 0115 10/23/14 0404 10/24/14 0511 10/26/14 0540  PHART 7.358 7.335* 7.436 7.451* 7.472*  PCO2ART 30.0* 28.9* 39.8 51.0* 45.4*  PO2ART 50.9* 67.0* 62.0* 83.9 74.6*  HCO3 16.4* 15.4* 26.1* 34.8* 32.9*  TCO2 17.4 16 27.3 36.3 34.3  O2SAT 79.7 92.0 88.5 95.6 94.8   CBC  Recent Labs Lab 10/24/14 0510 10/25/14 0500 10/26/14 0500  HGB 7.1* 7.2* 7.0*  HCT 21.6* 22.6* 22.1*  WBC 12.3* 9.5 8.6  PLT 374 391 374   CARDIAC    Recent Labs Lab 10/21/14 1545  TROPONINI <0.03   CHEMISTRY  Recent Labs Lab 10/22/14 0500 10/23/14 0419  10/24/14 0510 10/24/14 1112 10/24/14 2222 10/25/14 0500 10/25/14 1112 10/26/14 0500  NA 136 138  --  141  --   --  143  --  145  K 4.2 3.5  --  3.6  --   --  4.1  --  4.1  CL 110 102  --  100  --   --  96  --  104  CO2 19 28  --  36*  --   --  39*  --  33*  GLUCOSE 113* 104*  --  120*  --   --  117*  --  118*  BUN <5* <5*  --  <5*  --   --  <5*  --  <5*  CREATININE 0.73 0.90  --  0.93  --   --  1.06  --  0.92  CALCIUM 7.7* 7.6*  --  7.7*  --   --  8.2*  --  8.2*  MG 1.5 1.6  < > 1.5 1.4* 2.6* 2.2 1.9 1.6  PHOS 3.6 4.0  < > 3.8 3.8 2.2* 3.2 3.1 2.7  < > = values in this interval not displayed. Estimated Creatinine Clearance: 69.3 mL/min (by C-G formula based on Cr of 0.92).  LIVER  Recent Labs Lab 10/20/14 0658 10/21/14 2300  AST 23 27  ALT 19 17  ALKPHOS 93 100  BILITOT 0.5 0.4  PROT 4.9* 5.1*  ALBUMIN 1.6* 1.8*   INFECTIOUS  Recent Labs Lab 10/21/14 1022 10/21/14 1545 10/21/14 2230 10/22/14 0500 10/23/14 0420  LATICACIDVEN 2.0  --  2.1 2.0  --   PROCALCITON  --  0.38  --  0.60 0.46   ENDOCRINE CBG (last 3)   Recent Labs  10/25/14 1913 10/25/14 2357 10/26/14 0405  GLUCAP 133* 114* 100*   IMAGING x48h Dg Chest Port 1 View  10/25/2014   CLINICAL DATA:  54 year old female with acute respiratory failure, hypoxia. Current history of HIV, renal failure. Initial encounter.  EXAM: PORTABLE CHEST - 1 VIEW   COMPARISON:  10/24/2014 and earlier.  FINDINGS: Portable AP semi upright view at 0451 hrs. Stable endotracheal tube. Stable visualized enteric tube, side hole the level of the proximal stomach. Stable cardiac size and mediastinal contours. Stable lung volumes. Confluent left lower lobe collapse/consolidation. Small left pleural effusion. No pneumothorax. Increased interstitial markings elsewhere. Ventilation overall has improved since 10/23/2014, stable since yesterday. No areas of worsening ventilation. Stable visualized osseous structures including right shoulder  are plastic.  IMPRESSION: 1.  Stable lines and tubes. 2. Stable ventilation since yesterday, improved since 10/23/2014. Interstitial opacity diffusely with left lower lobe collapse/consolidation and small effusion.   Electronically Signed   By: Lars Pinks M.D.   On: 10/25/2014 07:30   ASSESSMENT / PLAN:  PULMONARY ETT 12/31 >> A: Acute respiratory failure 2nd to aspiration PNA. Hx of emphysema. Continued tobacco abuse. edema  10/22/14 - likely ARDS  10/24/14- improved to 40% fio2/peep 12 10/25/14--PRVC 400/09/29/39%; ABG 7.45/51/83.9/34.8/99.7%   P:   Full vent support - Wean goal cpap 5 ps 5, assess rsbi and wean today may be able to extubate  Holding lasix this AM given hypotension, may need lasix infusion Follow CXR with further diuresis Neurostatus may preclude extubation Scheduled BD's Hold ICS for now Spiriva on hold Cont neg  Balance as able, see pulm edema on pcxr abg noted, reduce Mv if able  CARDIOVASCULAR Rt femoral CVL ([facility] in ICU by EDP) 12/31 >> A:  Severe sepsis. Hx of HTN.  - off pressors as of 10/24/14. ECHO 10/22/14 - EF 65% with trace pericardial effusion -hypotensive this AM 1/5 P:  pulm edema avoid pos balance Consider neck line, dc fem and cvp if not progressing  RENAL A:   AKI 2nd to obstructive uropathy (? spiriva )  >> improved with foley catheter insertion. Metabolic acidosis non gap  likely hypomag P:   Continue foley catheter Holding lasix today given hypotension, may change to drip Chem in am   GASTROINTESTINAL A:   Esophageal candidiasis. Diarrhea due to C diff colitis. P:   Pepcid for SUP Tube feeds at goal, hold if weaning well Avoiding ppi with cdiff  HEMATOLOGIC A:   Anemia of critical illness and chronic disease--Hb down to 7 today  Recent Labs Lab 10/22/14 0500 10/23/14 0419 10/24/14 0510 10/25/14 0500 10/26/14 0500  HCT 21.9* 21.6* 21.6* 22.6* 22.1*    P:  F/u CBC Transfuse for Hb < 7, may need to transfuse if continues to drop, lasix may help concetrate Lovenox   INFECTIOUS  Blood 12/22 >> negative Urine 12/23 >> negative CSF 12/24 >> negative C diff PCR 12/27 >> POSITIVE Urine 12/27 >> negative Blood 12/27 >>No growth Blood 12/31 >>NGTD Urine 12/31 >>Pseudomonas--resistant Imipenem, zosyn sensitive Sputum 12/31 >>Non-pathogenic oropharyngeal type flora A:   Severe sepsis. C diff colitis--on po Vanc Aspiration PNA--On Zosyn Esophageal candidiasis--On fluconazole Possible UTI--Pseudomonas on cx Hx of HIV. - normal CD4 sept 2015  P:   PER ID  Total days of antibiotics: 14  PO vanco 12/27>>> IV Zosyn 12/31>>1/4 Fluconazole 12/27>>> Fosfomycin 1/4  ENDOCRINE A:   No acute issues.  P:   Monitor blood sugar on BMET  NEUROLOGIC A:   Acute encephalopathy 2nd to respiratory failure, sepsis, AKI. Hx of Bipolar disorder. - following some commands but lethargic -off precedex - BP not tolerate  P:   Fentanyl off WUA   No family at bedside since [facility] through 10/26/14.   Inter-disciplinary family meet or Palliative Care meeting due by 10/28/14:   Signed: Jerene Pitch, MD PGY-3, Internal Medicine Resident Pager: [phone]  10/26/2014,7:19 AM   STAFF NOTE: Linwood Dibbles, MD FACP have personally reviewed patient's available data, including medical history, events of note, physical examination and test  results as part of my evaluation. I have discussed with resident/NP and other care providers such as pharmacist, RN and RRT. In addition, I personally evaluated patient and elicited key findings of: edema pcxr,  coarse, does follow commands, may need cvp, line, weaning, hold TF, ABX per ID The patient is critically ill with multiple organ systems failure and requires high complexity decision making for assessment and support, frequent evaluation and titration of therapies, application of advanced monitoring technologies and extensive interpretation of multiple databases.   Critical Care Time devoted to patient care services described in this note is30 Minutes. This time reflects time of care of this signee: Merrie Roof, MD FACP. This critical care time does not reflect procedure time, or teaching time or supervisory time of PA/NP/Med student/Med Resident etc but could involve care discussion time. Rest per NP/medical resident whose note is outlined above and that I agree with   Lavon Paganini. Titus Mould, MD, [place] Pgr: [facility] 10/26/2014 11:01 AM

## 2014-10-26 NOTE — Evaluation (Signed)
Clinical/Bedside Swallow Evaluation Patient Details  Name: [name] MRN: [id number] Date of Birth: January 27, 1961  Today's Date: 10/26/2014 Time: 6759-1638 SLP Time Calculation (min) (ACUTE ONLY): 16 min  Past Medical History:  Past Medical History  Diagnosis Date  . Baker's cyst   . Pulmonary lesion 1/03    on RUL onn CT   . Hemorrhoids   . Zoster 2001  . Family history of diabetes mellitus   . Tobacco user   . Other abnormal Papanicolaou smear of cervix and cervical HPV(795.09)   . Herpes zoster   . Hemorrhoids   . Baker's cyst   . Pancreatitis   . HIV disease     take Prezista,Truvada,Norvir,and Isentress daily  . Bipolar disorder     With hx of psychotic features.  . Articular cartilage disorder of knee   . Bursitis of shoulder   . Rotator cuff arthropathy   . Hypertension     takes Clonidine and Verapamil daily  . Emphysema   . Asthma     Albuterol resue inhaler and QVAR daily  . Shortness of breath     with exertion and stress  . Dizziness   . Joint pain   . Joint swelling   . Chronic back pain     buldging disc  . Bruises easily   . Gastric ulcer   . GERD (gastroesophageal reflux disease)     takes Nexium daily  . Chronic constipation   . Urinary frequency   . Urinary incontinence   . Nocturia   . History of blood transfusion at age 89  . Early cataracts, bilateral   . Anxiety   . Depression     takes Xanax daily and Depakote bid  . History of shingles   . 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 Tobacco abuse 10/26/2012  . Chronic back pain   . Chronic knee pain   . Complication of anesthesia     AGGITATED  . Uterine cancer     "had some kind of cancer in my teens; had to have TAH to remove it"  . Headache(784.0)     "often here lately; weather changing"  . Pneumonia     "several times"  . Arthritis     "all over" (03/16/2014)  . Osteoarthritis of left knee 03/16/2014  . Alcohol withdrawal delirium  03/22/2014   Past Surgical History:  Past Surgical History  Procedure Laterality Date  . Breast lumpectomy Right   . Breast biopsy Right ~ 1980  . Cholecystectomy  10+yrs ago  . Knee arthroscopy Left   . Finger surgery Right     rt middle finger with pin in it  . Epidural injections    . Esophagogastroduodenoscopy    . Shoulder hemi-arthroplasty  05/06/2012    Procedure: SHOULDER HEMI-ARTHROPLASTY;  Surgeon: Johnny Bridge, MD;  Location: [facility];  Service: Orthopedics;  Laterality: Right;  . Joint replacement    . Fracture surgery      Right finger third digit  . Appendectomy  1981    with hysterectomy  . Total knee arthroplasty Left 03/16/2014    Procedure: LEFT TOTAL KNEE ARTHROPLASTY;  Surgeon: Johnny Bridge, MD;  Location: [facility];  Service: Orthopedics;  Laterality: Left;  . Total abdominal hysterectomy  1981    w/BSO  . Esophagogastroduodenoscopy (egd) with propofol N/A 09/30/2014    Procedure: ESOPHAGOGASTRODUODENOSCOPY (EGD) WITH PROPOFOL;  Surgeon: Daneil Dolin, MD;  Location: [facility];  Service: Endoscopy;  Laterality: N/A;  . Esophageal biopsy  09/30/2014    Procedure: GASTRIC AND ESOPHAGEAL BIOPSY;  Surgeon: Daneil Dolin, MD;  Location: [facility];  Service: Endoscopy;;   HPI:  EGD on 09/30/14 due to N/V and difficulty swallowing. 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 She was sent to ER on 10/12/14 with vomiting blood and acute encephalopathy. She was also in acute renal failure with hyperkalemia and metabolic acidosis. She was started on Abx for sepsis and possible aspiration pneumonia and eventually started on diflucan for esophageal candidiasis. She developed diarrhea and was found to be positive for C diff. She continued to improve, and was transferred out of ICU on 10/19/14. She had speech therapy swallow evaluation which was normal. She developed progressive dyspnea on  10/21/14. She was treated with BiPAP and lasix, and transferred back to ICU. Her respiratory status did not improve, and she was intubated. She was then transferred to [facility] for further treatment. Pt was extubated 1/5.   Assessment / Plan / Recommendation Clinical Impression  Pt initially appeared to tolerate sips of thin liquid including challenging with 3 oz water test. However, pt soon began to show overt signs of airway compromise, including throat clearing, coughing, and expectoration of secretions. Given that previous bedside swallow evaluation was [facility], suspect an acute reversible dysphagia secondary to prolonged intubation, further exacerbated by current mentation. Recommend to maintain NPO with reassessment on next date for PO readiness.    Aspiration Risk  Severe    Diet Recommendation NPO   Medication Administration: Via alternative means    Other  Recommendations Oral Care Recommendations: Oral care Q4 per protocol   Follow Up Recommendations   (tbd)    Frequency and Duration min 3x week  1 week   Pertinent Vitals/Pain Pt c/o pain in knee, RN present and aware    SLP Swallow Goals     Swallow Study Prior Functional Status       General HPI: EGD on 09/30/14 due to N/V and difficulty swallowing. 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 She was sent to ER on 10/12/14 with vomiting blood and acute encephalopathy. She was also in acute renal failure with hyperkalemia and metabolic acidosis. She was started on Abx for sepsis and possible aspiration pneumonia and eventually started on diflucan for esophageal candidiasis. She developed diarrhea and was found to be positive for C diff. She continued to improve, and was transferred out of ICU on 10/19/14. She had speech therapy swallow evaluation which was normal. She developed progressive dyspnea on 10/21/14. She was treated with BiPAP  and lasix, and transferred back to ICU. Her respiratory status did not improve, and she was intubated. She was then transferred to [facility] for [facility] treatment. Pt was extubated 1/5. Type of Study: Bedside swallow evaluation Previous Swallow Assessment: BSE12/29 WFL Diet Prior to this Study: NPO Temperature Spikes Noted: Yes (low grade) Respiratory Status: Nasal cannula History of Recent Intubation: Yes Length of Intubations (days): 6 days Date extubated: 10/26/14 Behavior/Cognition: Alert;Cooperative;Confused Oral Cavity - Dentition: Adequate natural dentition Self-Feeding Abilities: Needs assist Patient Positioning: Upright in bed Baseline Vocal Quality: Clear;Low vocal intensity Volitional Cough: Weak    Oral/Motor/Sensory Function     Ice Chips Ice chips: Within functional limits Presentation: Spoon   Thin Liquid Thin Liquid: Impaired Presentation: Cup;Self Fed Pharyngeal  Phase Impairments: Throat Clearing - Immediate;Throat Clearing - Delayed;Cough - Immediate  Nectar Thick Nectar Thick Liquid: Not tested   Honey Thick Honey Thick Liquid: Not tested   Puree Puree: Impaired Presentation: Spoon Pharyngeal Phase Impairments: Throat Clearing - Immediate;Cough - Delayed   Solid   GO    Solid: Not tested        Germain Osgood, M.A. CCC-SLP [phone]  Germain Osgood 10/26/2014,4:39 PM

## 2014-10-26 NOTE — Telephone Encounter (Signed)
noted 

## 2014-10-26 NOTE — Progress Notes (Signed)
Dr. Elsworth Soho made aware of SBP in 70's, no additional orders in place, will continue to monitor.  Babs Bertin RN

## 2014-10-26 NOTE — Progress Notes (Signed)
[facility]    Subjective: Intubated but moving extremities nodding head, she wanted her mitts taken off   Antibiotics:  Anti-infectives    Start     Dose/Rate Route Frequency Ordered Stop   10/26/14 0800  elvitegravir-cobicistat-emtricitabine-tenofovir (GENVOYA) 150-150-200-10 MG tablet 1 tablet    Comments:  May give PER NG TUBE, MAY CRUSH thought not completely bioequivalent to GENVOYA po   1 tablet Oral Daily with breakfast 10/25/14 1501     10/26/14 0800  Darunavir Ethanolate (PREZISTA) tablet 800 mg     800 mg Per NG tube Daily with breakfast 10/25/14 1501     10/22/14 1000  fluconazole (DIFLUCAN) IVPB 200 mg     200 mg100 mL/hr over 60 Minutes Intravenous Every 24 hours 10/21/14 2231     10/21/14 2300  piperacillin-tazobactam (ZOSYN) IVPB 3.375 g  Status:  Discontinued     3.375 g12.5 mL/hr over 240 Minutes Intravenous 3 times per day 10/21/14 2254 10/25/14 0843   10/21/14 2218  vancomycin (VANCOCIN) 50 mg/mL oral solution 125 mg     125 mg Per Tube 4 times daily 10/21/14 2215 10/31/14 1359   10/21/14 1000  fluconazole (DIFLUCAN) tablet 200 mg  Status:  Discontinued     200 mg Oral Daily 10/20/14 1610 10/21/14 2215   10/17/14 1800  vancomycin (VANCOCIN) 50 mg/mL oral solution 125 mg  Status:  Discontinued     125 mg Oral 4 times daily 10/17/14 1736 10/21/14 2215   10/17/14 1400  clindamycin (CLEOCIN) capsule 300 mg  Status:  Discontinued     300 mg Oral 3 times per day 10/17/14 1022 10/17/14 1130   10/17/14 1200  fluconazole (DIFLUCAN) tablet 100 mg  Status:  Discontinued     100 mg Oral Daily 10/17/14 1152 10/20/14 1610   10/16/14 0800  ritonavir (NORVIR) tablet 100 mg  Status:  Discontinued     100 mg Oral Daily with breakfast 10/15/14 1237 10/21/14 2215   10/16/14 0800  Darunavir Ethanolate (PREZISTA) tablet 800 mg  Status:  Discontinued     800 mg Oral Daily with breakfast 10/15/14 1237 10/21/14 2215   10/15/14 1245  raltegravir (ISENTRESS)  tablet 400 mg  Status:  Discontinued     400 mg Oral 2 times daily 10/15/14 1237 10/21/14 2215   10/15/14 1245  emtricitabine-tenofovir (TRUVADA) 200-300 MG per tablet 1 tablet  Status:  Discontinued     1 tablet Oral Daily 10/15/14 1237 10/21/14 2215   10/14/14 1400  doripenem (DORIBAX) 500 mg in sodium chloride 0.9 % 100 mL IVPB  Status:  Discontinued     500 mg100 mL/hr over 60 Minutes Intravenous 3 times per day 10/14/14 1040 10/17/14 1022   10/14/14 0600  vancomycin (VANCOCIN) IVPB 750 mg/150 ml premix  Status:  Discontinued     750 mg150 mL/hr over 60 Minutes Intravenous Every 12 hours 10/13/14 1822 10/17/14 1022   10/13/14 2200  piperacillin-tazobactam (ZOSYN) IVPB 3.375 g  Status:  Discontinued     3.375 g12.5 mL/hr over 240 Minutes Intravenous Every 8 hours 10/13/14 1822 10/14/14 1039   10/13/14 1900  vancomycin (VANCOCIN) 1,250 mg in sodium chloride 0.9 % 250 mL IVPB     1,250 mg166.7 mL/hr over 90 Minutes Intravenous  Once 10/13/14 1822 10/13/14 2109   10/13/14 1230  cefTRIAXone (ROCEPHIN) 1 g in dextrose 5 % 50 mL IVPB - Premix  Status:  Discontinued     1 g100 mL/hr over 30 Minutes Intravenous  Every 24 hours 10/13/14 1113 10/14/14 0820      Medications: Scheduled Meds: . antiseptic oral rinse  7 mL Mouth Rinse QID  . chlorhexidine  15 mL Mouth Rinse BID  . darunavir  800 mg Per NG tube Q breakfast  . elvitegravir-cobicistat-emtricitabine-tenofovir  1 tablet Oral Q breakfast  . enoxaparin (LOVENOX) injection  40 mg Subcutaneous Q24H  . famotidine (PEPCID) IV  20 mg Intravenous Q12H  . fluconazole (DIFLUCAN) IV  200 mg Intravenous Q24H  . ipratropium-albuterol  3 mL Nebulization Q4H  . magnesium sulfate 1 - 4 g bolus IVPB  2 g Intravenous Once  . nystatin-triamcinolone   Topical BID  . potassium chloride  40 mEq Per Tube BID  . vancomycin  125 mg Per Tube QID   Continuous Infusions:   PRN Meds:.naphazoline, polyethylene glycol    Objective: Weight change: 1 lb 1.6 oz  (0.5 kg)  Intake/Output Summary (Last 24 hours) at 10/26/14 1255 Last data filed at 10/26/14 1200  Gross per 24 hour  Intake 1284.23 ml  Output   1570 ml  Net -285.77 ml   Blood pressure 92/62, pulse 97, temperature 99 F (37.2 C), temperature source Oral, resp. rate 14, height 5\' 4"  (1.626 m), weight 153 lb 14.1 oz (69.8 kg), SpO2 97 %. Temp:  [98.1 F (36.7 C)-99.8 F (37.7 C)] 99 F (37.2 C) (01/05 1207) Pulse Rate:  [73-123] 97 (01/05 1200) Resp:  [12-39] 14 (01/05 1200) BP: (73-105)/(50-73) 92/62 mmHg (01/05 1200) SpO2:  [95 %-100 %] 97 % (01/05 1220) FiO2 (%):  [40 %] 40 % (01/05 0836) Weight:  [153 lb 14.1 oz (69.8 kg)] 153 lb 14.1 oz (69.8 kg) (01/05 0458)  Physical Exam: General: Intubated but awake and following commands in restraints HEENT: anicteric sclera, , EOMI CVS tachcardic rate ,  no murmur rubs or gallops Chest: fairly clear to auscultation bilaterally, no wheezing, rales or rhonchi Abdomen: soft and diffusely tender Extremities: no  clubbing or edema noted bilaterally Neuro: nonfocal  CBC:  CBC Latest Ref Rng 10/26/2014 10/25/2014 10/24/2014  WBC 4.0 - 10.5 K/uL 8.6 9.5 12.3(H)  Hemoglobin 12.0 - 15.0 g/dL 7.0(L) 7.2(L) 7.1(L)  Hematocrit 36.0 - 46.0 % 22.1(L) 22.6(L) 21.6(L)  Platelets 150 - 400 K/uL 374 391 374      BMET  Recent Labs  10/25/14 0500 10/26/14 0500  NA 143 145  K 4.1 4.1  CL 96 104  CO2 39* 33*  GLUCOSE 117* 118*  BUN <5* <5*  CREATININE 1.06 0.92  CALCIUM 8.2* 8.2*     Liver Panel  No results for input(s): PROT, ALBUMIN, AST, ALT, ALKPHOS, BILITOT, BILIDIR, IBILI in the last 72 hours.     Sedimentation Rate No results for input(s): ESRSEDRATE in the last 72 hours. C-Reactive Protein No results for input(s): CRP in the last 72 hours.  Micro Results: Recent Results (from the past 720 hour(s))  Urine culture     Status: None   Collection Time: 10/12/14  2:50 PM  Result Value Ref Range Status   Specimen Description  URINE, CATHETERIZED  Final   Special Requests NONE  Final   Culture  Setup Time   Final    10/13/2014 00:54 Performed at [place] Performed at [facility]   Final   Culture NO GROWTH Performed at [facility]   Final   Report Status 10/13/2014 FINAL  Final  Culture, blood (routine x 2)  Status: None   Collection Time: 10/12/14  3:17 PM  Result Value Ref Range Status   Specimen Description BLOOD RIGHT HAND  Final   Special Requests BOTTLES DRAWN AEROBIC ONLY 5CC  Final   Culture NO GROWTH 5 DAYS  Final   Report Status 10/17/2014 FINAL  Final  Culture, blood (routine x 2)     Status: None   Collection Time: 10/12/14  5:00 PM  Result Value Ref Range Status   Specimen Description BLOOD RIGHT HAND  Final   Special Requests BOTTLES DRAWN AEROBIC ONLY 5CC  Final   Culture NO GROWTH 5 DAYS  Final   Report Status 10/17/2014 FINAL  Final  MRSA PCR Screening     Status: None   Collection Time: 10/12/14  8:00 PM  Result Value Ref Range Status   MRSA by PCR NEGATIVE NEGATIVE Final    Comment:        The GeneXpert MRSA Assay (FDA approved for NASAL specimens only), is one component of a comprehensive MRSA colonization surveillance program. It is not intended to diagnose MRSA infection nor to guide or monitor treatment for MRSA infections.   CSF culture     Status: None   Collection Time: 10/14/14 10:50 AM  Result Value Ref Range Status   Specimen Description CSF  Final   Special Requests NONE  Final   Gram Stain   Final    WBC PRESENT, PREDOMINANTLY MONONUCLEAR NO ORGANISMS SEEN CYTOSPIN Performed at [facility] Performed at [place]   Final    NO GROWTH 3 DAYS Performed at [facility]    Report Status 10/17/2014 FINAL  Final  Clostridium Difficile by PCR     Status: Abnormal   Collection Time: 10/17/14  3:41 PM  Result Value Ref Range Status   C difficile by pcr POSITIVE  (A) NEGATIVE Final    Comment: CRITICAL RESULT CALLED TO, READ BACK BY AND VERIFIED WITH: N.ROWE AT 1497 ON 10/17/14 BY S.VANHOORNE   Culture, blood (routine x 2)     Status: None   Collection Time: 10/17/14  5:01 PM  Result Value Ref Range Status   Specimen Description BLOOD LEFT ANTECUBITAL  Final   Special Requests BOTTLES DRAWN AEROBIC AND ANAEROBIC 6CC  Final   Culture NO GROWTH 6 DAYS  Final   Report Status 10/23/2014 FINAL  Final  Culture, Urine     Status: None   Collection Time: 10/17/14  5:33 PM  Result Value Ref Range Status   Specimen Description URINE, CATHETERIZED  Final   Special Requests NONE  Final   Colony Count NO GROWTH Performed at [facility]   Final   Culture NO GROWTH Performed at [facility]   Final   Report Status 10/19/2014 FINAL  Final  Culture, blood (routine x 2)     Status: None   Collection Time: 10/17/14  9:09 PM  Result Value Ref Range Status   Specimen Description BLOOD LEFT HAND  Final   Special Requests BOTTLES DRAWN AEROBIC ONLY 5CC  Final   Culture NO GROWTH 6 DAYS  Final   Report Status 10/23/2014 FINAL  Final  Culture, Urine     Status: None   Collection Time: 10/21/14  3:17 PM  Result Value Ref Range Status   Specimen Description URINE, CATHETERIZED  Final   Special Requests NONE  Final   Colony Count   Final    >=100,000 COLONIES/ML Performed at [facility]    Culture   Final    PSEUDOMONAS AERUGINOSA Performed at [facility]    Report Status 10/24/2014 FINAL  Final   Organism ID, Bacteria PSEUDOMONAS AERUGINOSA  Final      Susceptibility   Pseudomonas aeruginosa - MIC*    CEFEPIME 8 SENSITIVE Sensitive     CEFTAZIDIME 16 INTERMEDIATE Intermediate     CIPROFLOXACIN 1 SENSITIVE Sensitive     GENTAMICIN 4 SENSITIVE Sensitive     IMIPENEM >=16 RESISTANT Resistant     PIP/TAZO 32 SENSITIVE Sensitive     TOBRAMYCIN <=1 SENSITIVE Sensitive     * PSEUDOMONAS AERUGINOSA  Culture, blood  (routine x 2)     Status: None   Collection Time: 10/21/14  3:45 PM  Result Value Ref Range Status   Specimen Description BLOOD RIGHT ANTECUBITAL  Final   Special Requests BOTTLES DRAWN AEROBIC AND ANAEROBIC 5CC  Final   Culture NO GROWTH 5 DAYS  Final   Report Status 10/26/2014 FINAL  Final  Culture, respiratory (NON-Expectorated)     Status: None   Collection Time: 10/21/14 10:51 PM  Result Value Ref Range Status   Specimen Description TRACHEAL ASPIRATE  Final   Special Requests NONE  Final   Gram Stain   Final    RARE WBC PRESENT, PREDOMINANTLY MONONUCLEAR NO SQUAMOUS EPITHELIAL CELLS SEEN NO ORGANISMS SEEN Performed at [facility]    Culture   Final    Non-Pathogenic Oropharyngeal-type Flora Isolated. Performed at [facility]    Report Status 10/24/2014 FINAL  Final    Studies/Results: Dg Chest Port 1 View  10/26/2014   CLINICAL DATA:  Sob,,resp distress,fluid on lungs.  EXAM: PORTABLE CHEST - 1 VIEW  COMPARISON:  10/25/2014  FINDINGS: Endotracheal tube has tip 2.5 cm above the carinal. The enteric tube courses through the region of the stomach and off the inferior portion of the film as tip is not visualized. The side port of the enteric tube is over the stomach.  Lungs are adequately inflated as patient is rotated to the right. There is continued bilateral hazy perihilar opacification likely mild interstitial edema. There is stable left base/ retrocardiac opacification likely combination of effusion with partial left lower lobe collapse/atelectasis. Slight worsening right base opacification likely small effusion with atelectasis. Cannot completely exclude infection in the lung bases. Cardiomediastinal silhouette and remainder of the exam is unchanged.  IMPRESSION: Stable left base/ retrocardiac opacification likely combination of effusion and lower lobe collapse. Slight worsening right base opacification likely small effusion with atelectasis.  Mild perihilar  opacification likely mild interstitial edema.  Tubes and lines as described.   Electronically Signed   By: Marin Olp M.D.   On: 10/26/2014 08:59   Dg Chest Port 1 View  10/25/2014   CLINICAL DATA:  54 year old female with acute respiratory failure, hypoxia. Current history of HIV, renal failure. Initial encounter.  EXAM: PORTABLE CHEST - 1 VIEW  COMPARISON:  10/24/2014 and earlier.  FINDINGS: Portable AP semi upright view at 0451 hrs. Stable endotracheal tube. Stable visualized enteric tube, side hole the level of the proximal stomach. Stable cardiac size and mediastinal contours. Stable lung volumes. Confluent left lower lobe collapse/consolidation. Small left pleural effusion. No pneumothorax. Increased interstitial markings elsewhere. Ventilation overall has improved since 10/23/2014, stable since yesterday. No areas of worsening ventilation. Stable visualized osseous structures including right shoulder are plastic.  IMPRESSION: 1.  Stable lines and tubes. 2. Stable ventilation since yesterday, improved since 10/23/2014. Interstitial  opacity diffusely with left lower lobe collapse/consolidation and small effusion.   Electronically Signed   By: Lars Pinks M.D.   On: 10/25/2014 07:30      Assessment/Plan:  Active Problems:   Human immunodeficiency virus (HIV) disease   Essential hypertension   Bipolar 1 disorder   Acute respiratory failure with hypoxia   Renal failure   Acute renal failure   Acute encephalopathy   Sepsis   Aspiration pneumonia   Pneumonia, aspiration   Clostridium difficile colitis   HIV (human immunodeficiency virus infection)   Acute respiratory failure   Encephalopathy   Candida UTI   Candida esophagitis   Pseudomonas aeruginosa infection    [name] is a 54 y.o. female with  Hx of HIV, bipolar disease, admitted with AKI from structured uropathy then given antibiotics for possible healthcare associated pneumonia who then developed C. difficile colitis than  hypotension currently being treated with Zosyn for possible healthcare associated pneumonia though she may have more likley pulm edema as well as oral vancomycin for C. difficile colitis and fluconazole for candidal esophagitis. Her urine cultures have grown Pseudomonas aeruginosa she remains on the ventlator   #1 Clostridium difficile colitis: Continue oral vancomycin, avoid other on necessary antibiotics as well as proton pump inhibitors  #2 possible HCAP: I favor this as being due to pulm edema . Resp cultures also unrevealing and pt has had several days of systemic abx with zosyn  #3 Pseudomonas in urine: she has pyuria, ? Ssx. I switch her to fosfomycin which will ONLY act at level of bladder and with no systemic drug less impact on her CDI  #4 HIV: simplified to [place] plus Prezista, will discuss this regimen with her further tomorrow   LOS: 14 days   Alcide Evener 10/26/2014, 12:55 PM

## 2014-10-26 NOTE — Telephone Encounter (Signed)
Reviewed records. Patient remains inpatient. Currently being followed by infectious disease and on Diflucan 200 mg daily.

## 2014-10-27 ENCOUNTER — Inpatient Hospital Stay (HOSPITAL_COMMUNITY): Payer: Medicare Other

## 2014-10-27 LAB — GLUCOSE, CAPILLARY
GLUCOSE-CAPILLARY: 80 mg/dL (ref 70–99)
GLUCOSE-CAPILLARY: 73 mg/dL (ref 70–99)
GLUCOSE-CAPILLARY: 83 mg/dL (ref 70–99)
GLUCOSE-CAPILLARY: 87 mg/dL (ref 70–99)
Glucose-Capillary: 69 mg/dL — ABNORMAL LOW (ref 70–99)
Glucose-Capillary: 80 mg/dL (ref 70–99)
Glucose-Capillary: 96 mg/dL (ref 70–99)

## 2014-10-27 LAB — CBC
HCT: 23.2 % — ABNORMAL LOW (ref 36.0–46.0)
Hemoglobin: 7.2 g/dL — ABNORMAL LOW (ref 12.0–15.0)
MCH: 26.9 pg (ref 26.0–34.0)
MCHC: 31 g/dL (ref 30.0–36.0)
MCV: 86.6 fL (ref 78.0–100.0)
PLATELETS: 428 10*3/uL — AB (ref 150–400)
RBC: 2.68 MIL/uL — AB (ref 3.87–5.11)
RDW: 15.1 % (ref 11.5–15.5)
WBC: 9.1 10*3/uL (ref 4.0–10.5)

## 2014-10-27 LAB — BASIC METABOLIC PANEL
ANION GAP: 12 (ref 5–15)
BUN: 5 mg/dL — AB (ref 6–23)
CHLORIDE: 106 meq/L (ref 96–112)
CO2: 25 mmol/L (ref 19–32)
CREATININE: 1.02 mg/dL (ref 0.50–1.10)
Calcium: 8.6 mg/dL (ref 8.4–10.5)
GFR calc Af Amer: 73 mL/min — ABNORMAL LOW (ref >90)
GFR calc non Af Amer: 63 mL/min — ABNORMAL LOW (ref >90)
Glucose, Bld: 74 mg/dL (ref 70–99)
Potassium: 4.2 mmol/L (ref 3.5–5.1)
Sodium: 143 mmol/L (ref 135–145)

## 2014-10-27 LAB — MAGNESIUM: Magnesium: 1.9 mg/dL (ref 1.5–2.5)

## 2014-10-27 MED ORDER — FUROSEMIDE 10 MG/ML IJ SOLN
40.0000 mg | Freq: Once | INTRAMUSCULAR | Status: AC
  Administered 2014-10-27: 40 mg via INTRAVENOUS
  Filled 2014-10-27: qty 4

## 2014-10-27 MED ORDER — ACETAMINOPHEN 325 MG PO TABS
650.0000 mg | ORAL_TABLET | Freq: Four times a day (QID) | ORAL | Status: DC | PRN
  Administered 2014-10-27 – 2014-11-01 (×4): 650 mg via ORAL
  Filled 2014-10-27 (×4): qty 2

## 2014-10-27 MED ORDER — MAGNESIUM SULFATE 2 GM/50ML IV SOLN
2.0000 g | Freq: Once | INTRAVENOUS | Status: AC
  Administered 2014-10-27: 2 g via INTRAVENOUS
  Filled 2014-10-27: qty 50

## 2014-10-27 MED ORDER — ONDANSETRON HCL 4 MG/2ML IJ SOLN
4.0000 mg | Freq: Four times a day (QID) | INTRAMUSCULAR | Status: DC | PRN
  Administered 2014-10-27 – 2014-11-02 (×7): 4 mg via INTRAVENOUS
  Filled 2014-10-27 (×8): qty 2

## 2014-10-27 MED ORDER — FUROSEMIDE 10 MG/ML IJ SOLN
20.0000 mg | Freq: Once | INTRAMUSCULAR | Status: AC
  Administered 2014-10-27: 20 mg via INTRAVENOUS
  Filled 2014-10-27: qty 2

## 2014-10-27 NOTE — Progress Notes (Signed)
Pt switched back to 6L [place] and sats 100%.

## 2014-10-27 NOTE — Progress Notes (Signed)
[facility] Progress Note Patient Name: Aleaya Latona DOB: [birth year] MRN: [id number]   Date of Service  10/27/2014  HPI/Events of Note  More hypoxic  eICU Interventions  Lasix pCXR     Intervention Category Intermediate Interventions: Respiratory distress - evaluation and management  ALVA,RAKESH V. 10/27/2014, 3:05 AM

## 2014-10-27 NOTE — Evaluation (Signed)
Physical Therapy Evaluation Patient Details Name: [name] MRN: [id number] DOB: 1961-05-09 Today's Date: 10/27/2014   History of Present Illness  54 y.o. 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  Clinical Impression  Pt admitted with the above complications. Pt currently with functional limitations due to the deficits listed below (see PT Problem List). Min-mod assist +2 for transfer and short ambulatory distance with great difficulty maintaining balance, fatigues quickly. HR 118-130 during therapy session with SpO2 100% on room air. Oriented to person and place only.  Pt will benefit from skilled PT to increase their independence and safety with mobility to allow discharge to the venue listed below.       Follow Up Recommendations SNF    Equipment Recommendations  None recommended by PT    Recommendations for Other Services OT consult     Precautions / Restrictions Precautions Precautions: Fall Precaution Comments: C diff precautions Restrictions Weight Bearing Restrictions: No      Mobility  Bed Mobility Overal bed mobility: Needs Assistance Bed Mobility: Sit to Supine       Sit to supine: Mod assist;+2 for physical assistance   General bed mobility comments: Mod assist for truncal support and +2 for LE support into bed. Pt poorly responds to directions for technique with this task.  Transfers Overall transfer level: Needs assistance Equipment used: Rolling walker (2 wheeled) Transfers: Sit to/from Stand Sit to Stand: Min assist;+2 physical assistance         General transfer comment: Min assist +2 for boost and balance from recliner and lowest bed setting. Pt with slight posterior lean upon standing needing min assist intermittently to correct.  Ambulation/Gait Ambulation/Gait assistance: Mod assist;+2 physical assistance Ambulation  Distance (Feet): 5 Feet Assistive device: Rolling walker (2 wheeled) Gait Pattern/deviations: Step-to pattern;Decreased step length - right;Decreased step length - left;Decreased stride length;Shuffle;Ataxic;Staggering left;Staggering right;Trunk flexed;Narrow base of support Gait velocity: decreased Gait velocity interpretation: Below normal speed for age/gender General Gait Details: Pt able to take several steps initially in an attempt to ambulate around the bed however began to require more assistance up to Mod assist level to maintain upright posture due to loss of balance to posterior. Attempted to sit prematurely on edge of bed and required +2 assist to safely sit. Practiced side stepping at edge of bed, with ataxic foot placement and moderate buckling of LEs.  Stairs            Wheelchair Mobility    Modified Rankin (Stroke Patients Only)       Balance Overall balance assessment: Needs assistance Sitting-balance support: Single extremity supported;Feet supported Sitting balance-Leahy Scale: Poor Sitting balance - Comments: sits edge of bed with intermittent need for assist. head tilts laterally towards her right side significantly but is able to correct at times.   Standing balance support: Bilateral upper extremity supported Standing balance-Leahy Scale: Poor                               Pertinent Vitals/Pain Pain Assessment: 0-10 Pain Score: 10-Worst pain ever Pain Location: abdomen Pain Descriptors / Indicators: Aching Pain Intervention(s): Limited activity within patient's tolerance;Premedicated before session;Repositioned  BP 119/83 SpO2 100% on room air HR 118-130    Home Living Family/patient expects to be discharged to:: Skilled nursing facility   Available Help at Discharge: [place] Type of Home: House Home Access:  Stairs to enter Entrance Stairs-Rails: None Entrance Stairs-Number of Steps: 1 Home Layout: One level Home  Equipment: [place] - 2 wheels;Bedside commode;Shower seat;Grab bars - toilet Additional Comments: Pt is a poor historian. Most information obtained through previous notes.    Prior Function Level of Independence: Needs assistance         Comments: Pt states she was ambulatory at a SNF prior to this admission     Hand Dominance   Dominant Hand: Right    Extremity/Trunk Assessment   Upper Extremity Assessment: Defer to OT evaluation           Lower Extremity Assessment: Generalized weakness;Difficult to assess due to impaired cognition         Communication   Communication: Expressive difficulties  Cognition Arousal/Alertness: Lethargic Behavior During Therapy: WFL for tasks assessed/performed Overall Cognitive Status: No family/caregiver present to determine baseline cognitive functioning                      General Comments General comments (skin integrity, edema, etc.): pt was a poor historian with the occasional clarity to describe her previous living situation at a SNF.    Exercises General Exercises - Lower Extremity Ankle Circles/Pumps: AROM;Both;10 reps;Supine      Assessment/Plan    PT Assessment Patient needs continued PT services  PT Diagnosis Generalized weakness;Acute pain;Difficulty walking   PT Problem List Decreased strength;Decreased activity tolerance;Decreased balance;Decreased mobility;Decreased cognition;Decreased coordination;Decreased knowledge of use of DME;Decreased safety awareness;Decreased knowledge of precautions;Cardiopulmonary status limiting activity;Pain  PT Treatment Interventions DME instruction;Gait training;Functional mobility training;Therapeutic activities;Therapeutic exercise;Balance training;Neuromuscular re-education;Cognitive remediation;Patient/family education;Modalities   PT Goals (Current goals can be found in the Care Plan section) Acute Rehab PT Goals Patient Stated Goal: none stated PT Goal Formulation:  With patient Time For Goal Achievement: 11/10/14 Potential to Achieve Goals: Fair    Frequency Min 2X/week   Barriers to discharge        Co-evaluation               End of Session Equipment Utilized During Treatment: Gait belt Activity Tolerance: Patient limited by fatigue Patient left: in bed;with call bell/phone within reach Nurse Communication: Mobility status         Time: 9373-4287 PT Time Calculation (min) (ACUTE ONLY): 26 min   Charges:   PT Evaluation $Initial PT Evaluation Tier I: 1 Procedure PT Treatments $Therapeutic Activity: 8-22 mins   PT G CodesEllouise Newer 10/27/2014, 11:53 AM Elayne Snare, [place]

## 2014-10-27 NOTE — Progress Notes (Addendum)
Speech Language Pathology Treatment: Dysphagia  Patient Details Name: Giannie Soliday MRN: [id number] DOB: 11/04/1960 Today's Date: 10/27/2014 Time: 1011-1030 SLP Time Calculation (min) (ACUTE ONLY): 19 min  Assessment / Plan / Recommendation Clinical Impression  Diagnostic treatment complete with focus on readiness to advance diet. Patient alert but confused, requiring moderate-max cues for sustained attention to task due to both internal and external distractions. Po trials provided. Patient presents with what appears to be a functional oropharyngeal swallow with timely oral transit and no change in vocal quality (hoarse, low intensity) from baseline. Subtle delayed throat clearing noted, primarily towards end of treatment session which SLP suspects due to known esophageal deficits and irritation to mucosa s/p prolonged intubation although cannot r/o decreased airway protection. Discussed with RN. In light of recurrent episode of respiratory distress requiring intubation, CXR suggestive of aspiration event, and esophageal deficits which increase risk of post prandial aspiration, recommend proceeding with instrumental testing to determine safest diet. Hopeful that patient will be able to resume a po diet today.     HPI HPI: EGD on 09/30/14 due to N/V and difficulty swallowing. 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 She was sent to ER on 10/12/14 with vomiting blood and acute encephalopathy. She was also in acute renal failure with hyperkalemia and metabolic acidosis. She was started on Abx for sepsis and possible aspiration pneumonia and eventually started on diflucan for esophageal candidiasis. She developed diarrhea and was found to be positive for C diff. She continued to improve, and was transferred out of ICU on 10/19/14. She had speech therapy swallow evaluation which was normal. She developed  progressive dyspnea on 10/21/14. She was treated with BiPAP and lasix, and transferred back to ICU. Her respiratory status did not improve, and she was intubated. She was then transferred to [facility] for further treatment. Pt was extubated 1/5.   Pertinent Vitals Pain Assessment: Faces Faces Pain Scale: Hurts little more Pain Location: ? lower back, RN aware Pain Intervention(s):  (Rn notified)  SLP Plan  Goals updated    Recommendations NPO except meds while in [place]e                 [place], CCC-SLP [phone]   Shalan Neault Mer[place] 10/27/2014, 10:37 AM

## 2014-10-27 NOTE — Clinical Social Work Note (Signed)
Clinical Social Worker has assessed pt and pt's family. Full psychosocial to follow.  Glendon Axe, MSW, LCSWA [phone] 10/27/2014 3:02 PM

## 2014-10-27 NOTE — Progress Notes (Signed)
PULMONARY / CRITICAL CARE MEDICINE   Name: [name] MRN: [id number] DOB: [birth year]    ADMISSION DATE:  10/21/2014  REFERRING MD :  Verneita Griffes  STUDIES:  12/22 CT head >> no acute abnormalities 12/23 MRI brain >> normal for age 52/23 Renal u/s >> normal 12/24 LP ([facility]) >> protein 29, glucose 73, WBC 0, HSV DNA negative, Cryptococcal Ag negative 12/26 MRI T/L spine >> mild degenerative disease, mild spondylosis L4-L5, L5-S1 10/25/14: pCXR: interstitial opacity diffusely with left lower lobe collapse/consolidation and small effusion 10/27/13: pCXR: Resolution of pulmonary edema, Improving left base atelectasis/ effusion, Residual right base atelectasis.  SIGNIFICANT EVENTS: 12/10 Outpt EGD >> candidal esophagitis 12/22 admit for ARF with obstructive uropathy at [facility] 12/25 Tachycardia >> tx with diltiazem 12/27 Diarrhea 12/29 Speech therapy >> normal swallow eval 12/30 hypotension 12/31 VDRF, transfer to [facility] 10/23/14:  off pressors, On fent gtt  + prn fent  + bicarb gtt -. Follows commands. On 60%, peep 12 PCV 10/24/14: off pressors and bicarb . On PCV 40% fio2, peep 12 10/25/14: Tmax 101.64F overnight, precedex gtt for agitation 1/5: Afebrile overnight, hypotensive but off pressors, off precedex. Extubated  SUBJECTIVE/OVERNIGHT/INTERVAL HX 1/6: Respiratory distress overnight requiring ventimask, improved with 40mg  IV lasix x 1 overnight.  CXR shows improved pulmonary edema.   Moaning this morning reporting vaginal pain. More arousable and alert but still some confusion. Down to 4L NC02 with o2 sat 99-100%  VITAL SIGNS: Temp:  [97.8 F (36.6 C)-99.5 F (37.5 C)] 99.3 F (37.4 C) (01/06 0418) Pulse Rate:  [85-123] 119 (01/06 0700) Resp:  [9-27] 9 (01/06 0700) BP: (80-124)/(53-92) 100/77 mmHg (01/06 0700) SpO2:  [82 %-100 %] 97 % (01/06 0700) FiO2 (%):  [40 %-45 %] 45 % (01/06 0342) Weight:  [143 lb 15.4 oz (65.3 kg)] 143 lb 15.4 oz (65.3 kg) (01/06 0630) HEMODYNAMICS:    VENTILATOR SETTINGS: Vent Mode:  [-] CPAP;PSV FiO2 (%):  [40 %-45 %] 45 % PEEP:  [5 cmH20] 5 cmH20 Pressure Support:  [5 cmH20] 5 cmH20 INTAKE / OUTPUT:  Intake/Output Summary (Last 24 hours) at 10/27/14 0800 Last data filed at 10/27/14 0600  Gross per 24 hour  Intake    150 ml  Output   1815 ml  Net  -1665 ml   PHYSICAL EXAMINATION: General: lying in bed, eyes closed, occasionally moaning Neuro:  Follows commands and responding to questions. Alert and oriented to self, knows hospital but does not know name of hospital or date. Correctly identifies birthday. Generalized weakness HEENT:  EOMI, no thrush on tongue Cardiovascular:  Tachycardia Lungs:  Mild rales at bases Abdomen:  Soft, mild tenderness to palpation epigastric region, +bs GU: foley in place Musculoskeletal:  Moving extremities Skin:  Dry  LABS:  PULMONARY  Recent Labs Lab 10/21/14 2228 10/22/14 0115 10/23/14 0404 10/24/14 0511 10/26/14 0540  PHART 7.358 7.335* 7.436 7.451* 7.472*  PCO2ART 30.0* 28.9* 39.8 51.0* 45.4*  PO2ART 50.9* 67.0* 62.0* 83.9 74.6*  HCO3 16.4* 15.4* 26.1* 34.8* 32.9*  TCO2 17.4 16 27.3 36.3 34.3  O2SAT 79.7 92.0 88.5 95.6 94.8   CBC  Recent Labs Lab 10/25/14 0500 10/26/14 0500 10/27/14 0515  HGB 7.2* 7.0* 7.2*  HCT 22.6* 22.1* 23.2*  WBC 9.5 8.6 9.1  PLT 391 374 428*   CARDIAC    Recent Labs Lab 10/21/14 1545  TROPONINI <0.03   CHEMISTRY  Recent Labs Lab 10/23/14 0419  10/24/14 0510 10/24/14 1112 10/24/14 2222 10/25/14 0500 10/25/14 1112 10/26/14 0500 10/26/14 1740  10/27/14 0515  NA 138  --  141  --   --  143  --  145  --  143  K 3.5  --  3.6  --   --  4.1  --  4.1  --  4.2  CL 102  --  100  --   --  96  --  104  --  106  CO2 28  --  36*  --   --  39*  --  33*  --  25  GLUCOSE 104*  --  120*  --   --  117*  --  118*  --  74  BUN <5*  --  <5*  --   --  <5*  --  <5*  --  5*  CREATININE 0.90  --  0.93  --   --  1.06  --  0.92  --  1.02  CALCIUM 7.6*   --  7.7*  --   --  8.2*  --  8.2*  --  8.6  MG 1.6  < > 1.5 1.4* 2.6* 2.2 1.9 1.6 1.6 1.9  PHOS 4.0  < > 3.8 3.8 2.2* 3.2 3.1 2.7  --   --   < > = values in this interval not displayed. Estimated Creatinine Clearance: 56.3 mL/min (by C-G formula based on Cr of 1.02).  LIVER  Recent Labs Lab 10/21/14 2300  AST 27  ALT 17  ALKPHOS 100  BILITOT 0.4  PROT 5.1*  ALBUMIN 1.8*   INFECTIOUS  Recent Labs Lab 10/21/14 1022 10/21/14 1545 10/21/14 2230 10/22/14 0500 10/23/14 0420  LATICACIDVEN 2.0  --  2.1 2.0  --   PROCALCITON  --  0.38  --  0.60 0.46   ENDOCRINE CBG (last 3)   Recent Labs  10/26/14 1916 10/27/14 0017 10/27/14 0419  GLUCAP 88 80 80   IMAGING x48h Dg Chest Port 1 View  10/27/2014   CLINICAL DATA:  Hypoxia.  EXAM: PORTABLE CHEST - 1 VIEW  COMPARISON:  10/26/2014, 10/25/2014 and 10/24/2014  FINDINGS: Endotracheal tube and OG tube have been removed.  Pulmonary edema has resolved. There is slight atelectasis at the right base. Atelectasis/effusion at the left base has improved slightly.  IMPRESSION: 1. Resolution of pulmonary edema. 2. Improving left base atelectasis/ effusion. 3. Residual right base atelectasis.   Electronically Signed   By: Rozetta Nunnery M.D.   On: 10/27/2014 07:31   Dg Chest Port 1 View  10/26/2014   CLINICAL DATA:  Sob,,resp distress,fluid on lungs.  EXAM: PORTABLE CHEST - 1 VIEW  COMPARISON:  10/25/2014  FINDINGS: Endotracheal tube has tip 2.5 cm above the carinal. The enteric tube courses through the region of the stomach and off the inferior portion of the film as tip is not visualized. The side port of the enteric tube is over the stomach.  Lungs are adequately inflated as patient is rotated to the right. There is continued bilateral hazy perihilar opacification likely mild interstitial edema. There is stable left base/ retrocardiac opacification likely combination of effusion with partial left lower lobe collapse/atelectasis. Slight worsening right  base opacification likely small effusion with atelectasis. Cannot completely exclude infection in the lung bases. Cardiomediastinal silhouette and remainder of the exam is unchanged.  IMPRESSION: Stable left base/ retrocardiac opacification likely combination of effusion and lower lobe collapse. Slight worsening right base opacification likely small effusion with atelectasis.  Mild perihilar opacification likely mild interstitial edema.  Tubes and lines as described.  Electronically Signed   By: Marin Olp M.D.   On: 10/26/2014 08:59   ASSESSMENT / PLAN:  PULMONARY ETT 12/31 >>1/5 A: Acute respiratory failure 2nd to aspiration PNA--now extubated, on 4L [place] O2 Hx of emphysema. Continued tobacco abuse. Pulm edema--improving   P:   Received lasix overnight, CXR improved this morning BDs PRN Hold ICS for now Spiriva on hold Cont neg  Balance as able, see pulm edema on pcxr Lasix continued  CARDIOVASCULAR Rt femoral CVL ([facility] in ICU by EDP) 12/31 >> d/c today Tachycardia A:  Severe sepsis--resolved Hx of HTN.  - off pressors as of 10/24/14. ECHO 10/22/14 - EF 65% with trace pericardial effusion -Hypotension--improved P:  Pulmonary edema avoid pos balance D/c femoral line Tele remain  RENAL A:   AKI 2nd to obstructive uropathy (? spiriva )--resolved Hypomag Vaginal discomfort  Net neg 8.9L since admission  P:   D/c foley catheter today May need to check u/a Lasix overnight, repeat Replace Mag  GASTROINTESTINAL A:   Esophageal candidiasis. Diarrhea due to C diff colitis. P:   Pepcid for SUP Failed swallow eval yesterday, NPO for now, reassess today as mental status has improved Avoiding ppi with cdiff  HEMATOLOGIC A:   Anemia of critical illness and chronic disease--Hb stable at 7   Recent Labs Lab 10/23/14 0419 10/24/14 0510 10/25/14 0500 10/26/14 0500 10/27/14 0515  HCT 21.6* 21.6* 22.6* 22.1* 23.2*    P:  F/u CBC Transfuse for Hb < 7, may need to  transfuse if continues to drop, lasix may help concetrate Lovenox   INFECTIOUS  Blood 12/22 >> negative Urine 12/23 >> negative CSF 12/24 >> negative C diff PCR 12/27 >> POSITIVE Urine 12/27 >> negative Blood 12/27 >>No growth Blood 12/31 >>No growth Urine 12/31 >>Pseudomonas--resistant Imipenem, zosyn sensitive Sputum 12/31 >>Non-pathogenic oropharyngeal type flora A:   Severe sepsis--resolved C diff colitis--on po Vanc Aspiration PNA--Zosyn has been d/c'd Esophageal candidiasis--On fluconazole Possible UTI--Pseudomonas on cx Hx of HIV--normal CD4 sept 2015  P:   PER ID  Total days of antibiotics: 15  PO vanco 12/27>>> IV Zosyn 12/31>>1/4 Fluconazole 12/27>>> Fosfomycin 1/4  ENDOCRINE A:   No acute issues.  P:   Monitor blood sugar on BMET  NEUROLOGIC A:   Acute encephalopathy 2nd to respiratory failure, sepsis, AKI--still some confusion but more alert today Hx of Bipolar disorder Generalized weakness  P:   -PT/OT Pall care assessment ton floor  No family at bedside since admit to [facility] through 10/27/14--will try to get information from patient today as she is more alert   Inter-disciplinary family meet or Palliative Care meeting due by 10/28/14:   Signed: Jerene Pitch, MD PGY-3, Internal Medicine Resident Pager: [phone]  10/27/2014,8:00 AM   STAFF NOTE: Linwood Dibbles, MD FACP have personally reviewed patient's available data, including medical history, events of note, physical examination and test results as part of my evaluation. I have discussed with resident/NP and other care providers such as pharmacist, RN and RRT. In addition, I personally evaluated patient and elicited key findings of: no distres snow, did well with lasix repeat, lasix again, chem in am , pall care assessment, dysp diet To triad, to sdu   Lavon Paganini. Titus Mould, MD, [place] Pgr: [facility] 10/27/2014 1:22 PM

## 2014-10-27 NOTE — Progress Notes (Signed)
[name]    Subjective: Alert oriented to person and place a bit slow   Antibiotics:  Anti-infectives    Start     Dose/Rate Route Frequency Ordered Stop   10/26/14 0800  elvitegravir-cobicistat-emtricitabine-tenofovir (GENVOYA) 150-150-200-10 MG tablet 1 tablet    Comments:  May give PER NG TUBE, MAY CRUSH thought not completely bioequivalent to GENVOYA po   1 tablet Oral Daily with breakfast 10/25/14 1501     10/26/14 0800  Darunavir Ethanolate (PREZISTA) tablet 800 mg     800 mg Per NG tube Daily with breakfast 10/25/14 1501     10/22/14 1000  fluconazole (DIFLUCAN) IVPB 200 mg  Status:  Discontinued     200 mg100 mL/hr over 60 Minutes Intravenous Every 24 hours 10/21/14 2231 10/27/14 1325   10/21/14 2300  piperacillin-tazobactam (ZOSYN) IVPB 3.375 g  Status:  Discontinued     3.375 g12.5 mL/hr over 240 Minutes Intravenous 3 times per day 10/21/14 2254 10/25/14 0843   10/21/14 2218  vancomycin (VANCOCIN) 50 mg/mL oral solution 125 mg     125 mg Per Tube 4 times daily 10/21/14 2215 10/31/14 1359   10/21/14 1000  fluconazole (DIFLUCAN) tablet 200 mg  Status:  Discontinued     200 mg Oral Daily 10/20/14 1610 10/21/14 2215   10/17/14 1800  vancomycin (VANCOCIN) 50 mg/mL oral solution 125 mg  Status:  Discontinued     125 mg Oral 4 times daily 10/17/14 1736 10/21/14 2215   10/17/14 1400  clindamycin (CLEOCIN) capsule 300 mg  Status:  Discontinued     300 mg Oral 3 times per day 10/17/14 1022 10/17/14 1130   10/17/14 1200  fluconazole (DIFLUCAN) tablet 100 mg  Status:  Discontinued     100 mg Oral Daily 10/17/14 1152 10/20/14 1610   10/16/14 0800  ritonavir (NORVIR) tablet 100 mg  Status:  Discontinued     100 mg Oral Daily with breakfast 10/15/14 1237 10/21/14 2215   10/16/14 0800  Darunavir Ethanolate (PREZISTA) tablet 800 mg  Status:  Discontinued     800 mg Oral Daily with breakfast 10/15/14 1237 10/21/14 2215   10/15/14 1245  raltegravir  (ISENTRESS) tablet 400 mg  Status:  Discontinued     400 mg Oral 2 times daily 10/15/14 1237 10/21/14 2215   10/15/14 1245  emtricitabine-tenofovir (TRUVADA) 200-300 MG per tablet 1 tablet  Status:  Discontinued     1 tablet Oral Daily 10/15/14 1237 10/21/14 2215   10/14/14 1400  doripenem (DORIBAX) 500 mg in sodium chloride 0.9 % 100 mL IVPB  Status:  Discontinued     500 mg100 mL/hr over 60 Minutes Intravenous 3 times per day 10/14/14 1040 10/17/14 1022   10/14/14 0600  vancomycin (VANCOCIN) IVPB 750 mg/150 ml premix  Status:  Discontinued     750 mg150 mL/hr over 60 Minutes Intravenous Every 12 hours 10/13/14 1822 10/17/14 1022   10/13/14 2200  piperacillin-tazobactam (ZOSYN) IVPB 3.375 g  Status:  Discontinued     3.375 g12.5 mL/hr over 240 Minutes Intravenous Every 8 hours 10/13/14 1822 10/14/14 1039   10/13/14 1900  vancomycin (VANCOCIN) 1,250 mg in sodium chloride 0.9 % 250 mL IVPB     1,250 mg166.7 mL/hr over 90 Minutes Intravenous  Once 10/13/14 1822 10/13/14 2109   10/13/14 1230  cefTRIAXone (ROCEPHIN) 1 g in dextrose 5 % 50 mL IVPB - Premix  Status:  Discontinued     1 g100 mL/hr over 30 Minutes  Intravenous Every 24 hours 10/13/14 1113 10/14/14 0820      Medications: Scheduled Meds: . antiseptic oral rinse  7 mL Mouth Rinse q12n4p  . chlorhexidine  15 mL Mouth Rinse BID  . darunavir  800 mg Per NG tube Q breakfast  . elvitegravir-cobicistat-emtricitabine-tenofovir  1 tablet Oral Q breakfast  . enoxaparin (LOVENOX) injection  40 mg Subcutaneous Q24H  . ipratropium-albuterol  3 mL Nebulization Q4H  . nystatin-triamcinolone   Topical BID  . potassium chloride  40 mEq Per Tube BID  . vancomycin  125 mg Per Tube QID   Continuous Infusions:   PRN Meds:.naphazoline, polyethylene glycol    Objective: Weight change: -9 lb 14.7 oz (-4.5 kg)  Intake/Output Summary (Last 24 hours) at 10/27/14 1514 Last data filed at 10/27/14 0800  Gross per 24 hour  Intake     50 ml  Output    1290 ml  Net  -1240 ml   Blood pressure 118/85, pulse 117, temperature 98.6 F (37 C), temperature source Oral, resp. rate 18, height 5\' 4"  (1.626 m), weight 143 lb 15.4 oz (65.3 kg), SpO2 95 %. Temp:  [97.8 F (36.6 C)-99.5 F (37.5 C)] 98.6 F (37 C) (01/06 1345) Pulse Rate:  [103-121] 117 (01/06 1415) Resp:  [9-27] 18 (01/06 1415) BP: (83-126)/(53-98) 118/85 mmHg (01/06 1400) SpO2:  [82 %-100 %] 95 % (01/06 1415) FiO2 (%):  [45 %] 45 % (01/06 0342) Weight:  [143 lb 15.4 oz (65.3 kg)] 143 lb 15.4 oz (65.3 kg) (01/06 0630)  Physical Exam: General: Alert and oriented to person and place  HEENT: anicteric sclera, , EOMI CVS tachcardic rate ,  no murmur rubs or gallops Chest: fairly clear to auscultation bilaterally, no wheezing, rales or rhonchi Abdomen: soft and diffusely tender Extremities: no  clubbing or edema noted bilaterally Neuro: nonfocal  CBC:  CBC Latest Ref Rng 10/27/2014 10/26/2014 10/25/2014  WBC 4.0 - 10.5 K/uL 9.1 8.6 9.5  Hemoglobin 12.0 - 15.0 g/dL 7.2(L) 7.0(L) 7.2(L)  Hematocrit 36.0 - 46.0 % 23.2(L) 22.1(L) 22.6(L)  Platelets 150 - 400 K/uL 428(H) 374 391      BMET  Recent Labs  10/26/14 0500 10/27/14 0515  NA 145 143  K 4.1 4.2  CL 104 106  CO2 33* 25  GLUCOSE 118* 74  BUN <5* 5*  CREATININE 0.92 1.02  CALCIUM 8.2* 8.6     Liver Panel  No results for input(s): PROT, ALBUMIN, AST, ALT, ALKPHOS, BILITOT, BILIDIR, IBILI in the last 72 hours.     Sedimentation Rate No results for input(s): ESRSEDRATE in the last 72 hours. C-Reactive Protein No results for input(s): CRP in the last 72 hours.  Micro Results: Recent Results (from the past 720 hour(s))  Urine culture     Status: None   Collection Time: 10/12/14  2:50 PM  Result Value Ref Range Status   Specimen Description URINE, CATHETERIZED  Final   Special Requests NONE  Final   Culture  Setup Time   Final    10/13/2014 00:54 Performed at [place] Performed at [facility]   Final   Culture NO GROWTH Performed at [facility]   Final   Report Status 10/13/2014 FINAL  Final  Culture, blood (routine x 2)     Status: None   Collection Time: 10/12/14  3:17 PM  Result Value Ref Range Status   Specimen Description BLOOD RIGHT HAND  Final  Special Requests BOTTLES DRAWN AEROBIC ONLY 5CC  Final   Culture NO GROWTH 5 DAYS  Final   Report Status 10/17/2014 FINAL  Final  Culture, blood (routine x 2)     Status: None   Collection Time: 10/12/14  5:00 PM  Result Value Ref Range Status   Specimen Description BLOOD RIGHT HAND  Final   Special Requests BOTTLES DRAWN AEROBIC ONLY 5CC  Final   Culture NO GROWTH 5 DAYS  Final   Report Status 10/17/2014 FINAL  Final  MRSA PCR Screening     Status: None   Collection Time: 10/12/14  8:00 PM  Result Value Ref Range Status   MRSA by PCR NEGATIVE NEGATIVE Final    Comment:        The GeneXpert MRSA Assay (FDA approved for NASAL specimens only), is one component of a comprehensive MRSA colonization surveillance program. It is not intended to diagnose MRSA infection nor to guide or monitor treatment for MRSA infections.   CSF culture     Status: None   Collection Time: 10/14/14 10:50 AM  Result Value Ref Range Status   Specimen Description CSF  Final   Special Requests NONE  Final   Gram Stain   Final    WBC PRESENT, PREDOMINANTLY MONONUCLEAR NO ORGANISMS SEEN CYTOSPIN Performed at [facility] Performed at [place]   Final    NO GROWTH 3 DAYS Performed at [facility]    Report Status 10/17/2014 FINAL  Final  Clostridium Difficile by PCR     Status: Abnormal   Collection Time: 10/17/14  3:41 PM  Result Value Ref Range Status   C difficile by pcr POSITIVE (A) NEGATIVE Final    Comment: CRITICAL RESULT CALLED TO, READ BACK BY AND VERIFIED WITH: N.ROWE AT 9030 ON 10/17/14 BY S.VANHOORNE   Culture, blood (routine x 2)      Status: None   Collection Time: 10/17/14  5:01 PM  Result Value Ref Range Status   Specimen Description BLOOD LEFT ANTECUBITAL  Final   Special Requests BOTTLES DRAWN AEROBIC AND ANAEROBIC 6CC  Final   Culture NO GROWTH 6 DAYS  Final   Report Status 10/23/2014 FINAL  Final  Culture, Urine     Status: None   Collection Time: 10/17/14  5:33 PM  Result Value Ref Range Status   Specimen Description URINE, CATHETERIZED  Final   Special Requests NONE  Final   Colony Count NO GROWTH Performed at [facility]   Final   Culture NO GROWTH Performed at [facility]   Final   Report Status 10/19/2014 FINAL  Final  Culture, blood (routine x 2)     Status: None   Collection Time: 10/17/14  9:09 PM  Result Value Ref Range Status   Specimen Description BLOOD LEFT HAND  Final   Special Requests BOTTLES DRAWN AEROBIC ONLY 5CC  Final   Culture NO GROWTH 6 DAYS  Final   Report Status 10/23/2014 FINAL  Final  Culture, Urine     Status: None   Collection Time: 10/21/14  3:17 PM  Result Value Ref Range Status   Specimen Description URINE, CATHETERIZED  Final   Special Requests NONE  Final   Colony Count   Final    >=100,000 COLONIES/ML Performed at [facility]    Culture   Final    PSEUDOMONAS AERUGINOSA Performed at [facility]    Report Status 10/24/2014 FINAL  Final   Organism ID, Bacteria PSEUDOMONAS AERUGINOSA  Final      Susceptibility   Pseudomonas aeruginosa - MIC*    CEFEPIME 8 SENSITIVE Sensitive     CEFTAZIDIME 16 INTERMEDIATE Intermediate     CIPROFLOXACIN 1 SENSITIVE Sensitive     GENTAMICIN 4 SENSITIVE Sensitive     IMIPENEM >=16 RESISTANT Resistant     PIP/TAZO 32 SENSITIVE Sensitive     TOBRAMYCIN <=1 SENSITIVE Sensitive     * PSEUDOMONAS AERUGINOSA  Culture, blood (routine x 2)     Status: None   Collection Time: 10/21/14  3:45 PM  Result Value Ref Range Status   Specimen Description BLOOD RIGHT ANTECUBITAL  Final   Special Requests  BOTTLES DRAWN AEROBIC AND ANAEROBIC 5CC  Final   Culture NO GROWTH 5 DAYS  Final   Report Status 10/26/2014 FINAL  Final  Culture, respiratory (NON-Expectorated)     Status: None   Collection Time: 10/21/14 10:51 PM  Result Value Ref Range Status   Specimen Description TRACHEAL ASPIRATE  Final   Special Requests NONE  Final   Gram Stain   Final    RARE WBC PRESENT, PREDOMINANTLY MONONUCLEAR NO SQUAMOUS EPITHELIAL CELLS SEEN NO ORGANISMS SEEN Performed at [facility]    Culture   Final    Non-Pathogenic Oropharyngeal-type Flora Isolated. Performed at [facility]    Report Status 10/24/2014 FINAL  Final    Studies/Results: Dg Chest Port 1 View  10/27/2014   CLINICAL DATA:  Hypoxia.  EXAM: PORTABLE CHEST - 1 VIEW  COMPARISON:  10/26/2014, 10/25/2014 and 10/24/2014  FINDINGS: Endotracheal tube and OG tube have been removed.  Pulmonary edema has resolved. There is slight atelectasis at the right base. Atelectasis/effusion at the left base has improved slightly.  IMPRESSION: 1. Resolution of pulmonary edema. 2. Improving left base atelectasis/ effusion. 3. Residual right base atelectasis.   Electronically Signed   By: Rozetta Nunnery M.D.   On: 10/27/2014 07:31   Dg Chest Port 1 View  10/26/2014   CLINICAL DATA:  Sob,,resp distress,fluid on lungs.  EXAM: PORTABLE CHEST - 1 VIEW  COMPARISON:  10/25/2014  FINDINGS: Endotracheal tube has tip 2.5 cm above the carinal. The enteric tube courses through the region of the stomach and off the inferior portion of the film as tip is not visualized. The side port of the enteric tube is over the stomach.  Lungs are adequately inflated as patient is rotated to the right. There is continued bilateral hazy perihilar opacification likely mild interstitial edema. There is stable left base/ retrocardiac opacification likely combination of effusion with partial left lower lobe collapse/atelectasis. Slight worsening right base opacification likely small  effusion with atelectasis. Cannot completely exclude infection in the lung bases. Cardiomediastinal silhouette and remainder of the exam is unchanged.  IMPRESSION: Stable left base/ retrocardiac opacification likely combination of effusion and lower lobe collapse. Slight worsening right base opacification likely small effusion with atelectasis.  Mild perihilar opacification likely mild interstitial edema.  Tubes and lines as described.   Electronically Signed   By: Marin Olp M.D.   On: 10/26/2014 08:59   Dg Swallowing Func-speech Pathology  10/27/2014   Loletha Grayer, CCC-SLP     10/27/2014  1:11 PM Objective Swallowing Evaluation: Modified Barium Swallowing Study   Patient Details  Name: Amberly Livas MRN: [id number] Date of Birth: May 11, 1961  Today's Date: 10/27/2014 Time: 1205-1229 SLP Time Calculation (min) (ACUTE ONLY): 24 min  Past Medical History:  Past Medical  History  Diagnosis Date  . Baker's cyst   . Pulmonary lesion 1/03    on RUL onn CT   . Hemorrhoids   . Zoster 2001  . Family history of diabetes mellitus   . Tobacco user   . Other abnormal Papanicolaou smear of cervix and cervical  HPV(795.09)   . Herpes zoster   . Hemorrhoids   . Baker's cyst   . Pancreatitis   . HIV disease     take Prezista,Truvada,Norvir,and Isentress daily  . Bipolar disorder     With hx of psychotic features.  . Articular cartilage disorder of knee   . Bursitis of shoulder   . Rotator cuff arthropathy   . Hypertension     takes Clonidine and Verapamil daily  . Emphysema   . Asthma     Albuterol resue inhaler and QVAR daily  . Shortness of breath     with exertion and stress  . Dizziness   . Joint pain   . Joint swelling   . Chronic back pain     buldging disc  . Bruises easily   . Gastric ulcer   . GERD (gastroesophageal reflux disease)     takes Nexium daily  . Chronic constipation   . Urinary frequency   . Urinary incontinence   . Nocturia   . History of blood transfusion at age 44  . Early cataracts, bilateral   .  Anxiety   . Depression     takes Xanax daily and Depakote bid  . History of shingles   . 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 Tobacco abuse 10/26/2012  . Chronic back pain   . Chronic knee pain   . Complication of anesthesia     AGGITATED  . Uterine cancer     "had some kind of cancer in my teens; had to have TAH to remove  it"  . Headache(784.0)     "often here lately; weather changing"  . Pneumonia     "several times"  . Arthritis     "all over" (03/16/2014)  . Osteoarthritis of left knee 03/16/2014  . Alcohol withdrawal delirium 03/22/2014   Past Surgical History:  Past Surgical History  Procedure Laterality Date  . Breast lumpectomy Right   . Breast biopsy Right ~ 1980  . Cholecystectomy  10+yrs ago  . Knee arthroscopy Left   . Finger surgery Right     rt middle finger with pin in it  . Epidural injections    . Esophagogastroduodenoscopy    . Shoulder hemi-arthroplasty  05/06/2012    Procedure: SHOULDER HEMI-ARTHROPLASTY;  Surgeon: Johnny Bridge, MD;  Location: [place];  Service: Orthopedics;  Laterality:  Right;  . Joint replacement    . Fracture surgery      Right finger third digit  . Appendectomy  1981    with hysterectomy  . Total knee arthroplasty Left 03/16/2014    Procedure: LEFT TOTAL KNEE ARTHROPLASTY;  Surgeon: Johnny Bridge, MD;  Location: [facility] [facility];  Service: Orthopedics;  Laterality:  Left;  . Total abdominal hysterectomy  1981    w/BSO  . Esophagogastroduodenoscopy (egd) with propofol N/A 09/30/2014    Procedure: ESOPHAGOGASTRODUODENOSCOPY (EGD) WITH PROPOFOL;   Surgeon: Daneil Dolin, MD;  Location: [facility];  Service:  Endoscopy;  Laterality: N/A;  . Esophageal biopsy  09/30/2014    Procedure: GASTRIC AND ESOPHAGEAL BIOPSY;  Surgeon: Daneil Dolin, MD;  Location: [facility];  Service: Endoscopy;;   HPI:  EGD on 09/30/14 due to N/V and difficulty swallowing. 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 She was  sent to ER on 10/12/14 with vomiting blood and acute  encephalopathy. She was also in acute renal failure with  hyperkalemia and metabolic acidosis. She was started on Abx for  sepsis and possible aspiration pneumonia and eventually started  on diflucan for esophageal candidiasis. She developed diarrhea  and was found to be positive for C diff. She continued to  improve, and was transferred out of ICU on 10/19/14. She had  speech therapy swallow evaluation which was normal. She developed  progressive dyspnea on 10/21/14. She was treated with BiPAP and  lasix, and transferred back to ICU. Her respiratory status did  not improve, and she was intubated. She was then transferred to  [facility] for [facility] treatment. Pt was extubated 1/5.     Assessment / Plan / Recommendation Clinical Impression  Dysphagia Diagnosis: Mild pharyngeal phase dysphagia Clinical impression: Pt has a mild pharyngeal dysphagia due to  delay in swallow initiation paired with decreased airway closure,  likely acute and reversible in nature secondary to irritation  from intubation. Pilar Plate penetration occurs with thin liquids, with  reflexive throat clearing as penetrates reach the level of the  vocal cords. Given recent respiratory distress and current  altered mentation, recommend Dys 3 diet and nectar thick liquids  to maximize safety with intake. Prognosis is good for advancement  with additional time post extubation and improved mentation.    Treatment Recommendation  Therapy as outlined in treatment plan below    Diet Recommendation Dysphagia 3 (Mechanical Soft);Nectar-thick  liquid   Liquid Administration via: Cup;No straw Medication Administration: Whole meds with puree Supervision: Patient able to self feed;Full supervision/cueing  for compensatory strategies Compensations: Slow rate;Small sips/bites Postural Changes and/or Swallow Maneuvers: Seated upright 90  degrees;Upright 30-60 min after meal     Other  Recommendations Oral Care Recommendations: Oral care BID Other Recommendations: Order thickener from pharmacy;Prohibited  food (jello, ice cream, thin soups);Remove water pitcher   Follow Up Recommendations  None    Frequency and Duration min 2x/week  2 weeks   Pertinent Vitals/Pain n/a    SLP Swallow Goals     General HPI: EGD on 09/30/14 due to N/V and difficulty  swallowing. 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 She was sent to ER on 10/12/14 with vomiting blood  and acute encephalopathy. She was also in acute renal failure  with hyperkalemia and metabolic acidosis. She was started on Abx  for sepsis and possible aspiration pneumonia and eventually  started on diflucan for esophageal candidiasis. She developed  diarrhea and was found to be positive for C diff. She continued  to improve, and was transferred out of ICU on 10/19/14. She had  speech therapy swallow evaluation which was normal. She developed  progressive dyspnea on 10/21/14. She was treated with BiPAP and  lasix, and transferred back to ICU. Her respiratory status did  not improve, and she was intubated. She was then transferred to  [facility] for further treatment. Pt was extubated 1/5. Type of Study: Modified Barium Swallowing Study Reason for Referral: Objectively evaluate swallowing function Previous Swallow Assessment: BSE12/29 WFL Diet Prior to this Study: NPO;IV Temperature Spikes Noted: No Respiratory Status: Nasal cannula History of Recent Intubation: Yes Length of Intubations (  days): 6 days Date extubated: 10/26/14 Behavior/Cognition: Alert;Cooperative;Confused;Distractible Oral Cavity - Dentition: Adequate natural dentition Self-Feeding Abilities: Needs assist Patient Positioning: Upright in chair Baseline Vocal Quality: Clear;Low vocal intensity Volitional Cough: Weak Volitional Swallow: Able to elicit (after prolonged delay) Anatomy:  Within functional limits Pharyngeal Secretions: Not observed secondary MBS    Reason for Referral Objectively evaluate swallowing function   Oral Phase Oral Preparation/Oral Phase Oral Phase: WFL   Pharyngeal Phase Pharyngeal Phase Pharyngeal Phase: Impaired Pharyngeal - Nectar Pharyngeal - Nectar Cup: Delayed swallow initiation;Reduced  airway/laryngeal closure Pharyngeal - Thin Pharyngeal - Thin Cup: Delayed swallow initiation;Reduced  airway/laryngeal closure;Penetration/Aspiration during swallow Penetration/Aspiration details (thin cup): Material enters  airway, remains ABOVE vocal cords and not ejected out Pharyngeal - Solids Pharyngeal - Puree: Delayed swallow initiation;Reduced  airway/laryngeal closure Pharyngeal - Mechanical Soft: Delayed swallow initiation;Reduced  airway/laryngeal closure  Cervical Esophageal Phase    GO    Cervical Esophageal Phase Cervical Esophageal Phase: [facility]        Germain Osgood, M.A. CCC-SLP [phone]  Germain Osgood 10/27/2014, 1:10 PM       Assessment/Plan:  Active Problems:   Human immunodeficiency virus (HIV) disease   Essential hypertension   Bipolar 1 disorder   Acute respiratory failure with hypoxia   Renal failure   Acute renal failure   Acute encephalopathy   Sepsis   Aspiration pneumonia   Pneumonia, aspiration   Clostridium difficile colitis   HIV (human immunodeficiency virus infection)   Acute respiratory failure   Encephalopathy   Candida UTI   Candida esophagitis   Pseudomonas aeruginosa infection   Urinary retention   Urinary tract infectious disease   Abdominal distension   Altered mental status    Maire Govan is a 54 y.o. female with  Hx of HIV, bipolar disease, admitted with AKI from structured uropathy then given antibiotics for possible healthcare associated pneumonia who then developed C. difficile colitis than hypotension currently being treated with Zosyn for possible healthcare associated pneumonia though she may  have more likley pulm edema as well as oral vancomycin for C. difficile colitis and fluconazole for candidal esophagitis. Her urine cultures have grown Pseudomonas aeruginosa   #1 Clostridium difficile colitis: Continue oral vancomycin to complete 14 days post last dose of systemic antibiotics, avoid other on necessary antibiotics as well as proton pump inhibitors  #2 Pseudomonas in urine: she has pyuria, ? Ssx. I switch her to fosfomycin which will ONLY act at level of bladder and with no systemic drug less impact on her   #3  HIV: simplified to [place] plus Prezista    I will sign off for now  [place]  Do not hesitate to give [place] a call back with any questions I'll ensure she has hospital follow-up in our clinic   LOS: 15 days   Alcide Evener 10/27/2014, 3:14 PM

## 2014-10-27 NOTE — Progress Notes (Signed)
Patient has not voided since foley removal this AM.  She has requested the bedpan 2 times with no output. Bladder scan performed and showed greater than 562ml urine in bladder.  Attempting to get patient to void in bedpan.  Richardean Canal RN, BSN, CCRN

## 2014-10-27 NOTE — Procedures (Signed)
Objective Swallowing Evaluation: Modified Barium Swallowing Study  Patient Details  Name: [name] MRN: [id number] Date of Birth: [birth year]  Today's Date: 10/27/2014 Time: 1205-1229 SLP Time Calculation (min) (ACUTE ONLY): 24 min  Past Medical History:  Past Medical History  Diagnosis Date  . Baker's cyst   . Pulmonary lesion 1/03    on RUL onn CT   . Hemorrhoids   . Zoster 2001  . Family history of diabetes mellitus   . Tobacco user   . Other abnormal Papanicolaou smear of cervix and cervical HPV(795.09)   . Herpes zoster   . Hemorrhoids   . Baker's cyst   . Pancreatitis   . HIV disease     take Prezista,Truvada,Norvir,and Isentress daily  . Bipolar disorder     With hx of psychotic features.  . Articular cartilage disorder of knee   . Bursitis of shoulder   . Rotator cuff arthropathy   . Hypertension     takes Clonidine and Verapamil daily  . Emphysema   . Asthma     Albuterol resue inhaler and QVAR daily  . Shortness of breath     with exertion and stress  . Dizziness   . Joint pain   . Joint swelling   . Chronic back pain     buldging disc  . Bruises easily   . Gastric ulcer   . GERD (gastroesophageal reflux disease)     takes Nexium daily  . Chronic constipation   . Urinary frequency   . Urinary incontinence   . Nocturia   . History of blood transfusion at age 39  . Early cataracts, bilateral   . Anxiety   . Depression     takes Xanax daily and Depakote bid  . History of shingles   . 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 Tobacco abuse 10/26/2012  . Chronic back pain   . Chronic knee pain   . Complication of anesthesia     AGGITATED  . Uterine cancer     "had some kind of cancer in my teens; had to have TAH to remove it"  . Headache(784.0)     "often here lately; weather changing"  . Pneumonia     "several times"  . Arthritis     "all over" (03/16/2014)  . Osteoarthritis of left knee 03/16/2014  .  Alcohol withdrawal delirium 03/22/2014   Past Surgical History:  Past Surgical History  Procedure Laterality Date  . Breast lumpectomy Right   . Breast biopsy Right ~ 1980  . Cholecystectomy  10+yrs ago  . Knee arthroscopy Left   . Finger surgery Right     rt middle finger with pin in it  . Epidural injections    . Esophagogastroduodenoscopy    . Shoulder hemi-arthroplasty  05/06/2012    Procedure: SHOULDER HEMI-ARTHROPLASTY;  Surgeon: Johnny Bridge, MD;  Location: [place];  Service: Orthopedics;  Laterality: Right;  . Joint replacement    . Fracture surgery      Right finger third digit  . Appendectomy  1981    with hysterectomy  . Total knee arthroplasty Left 03/16/2014    Procedure: LEFT TOTAL KNEE ARTHROPLASTY;  Surgeon: Johnny Bridge, MD;  Location: [place];  Service: Orthopedics;  Laterality: Left;  . Total abdominal hysterectomy  1981    w/BSO  . Esophagogastroduodenoscopy (egd) with propofol N/A 09/30/2014    Procedure: ESOPHAGOGASTRODUODENOSCOPY (EGD) WITH PROPOFOL;  Surgeon: Daneil Dolin, MD;  Location: [facility];  Service: Endoscopy;  Laterality: N/A;  . Esophageal biopsy  09/30/2014    Procedure: GASTRIC AND ESOPHAGEAL BIOPSY;  Surgeon: Daneil Dolin, MD;  Location: [facility];  Service: Endoscopy;;   HPI:  EGD on 09/30/14 due to N/V and difficulty swallowing. 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 She was sent to ER on 10/12/14 with vomiting blood and acute encephalopathy. She was also in acute renal failure with hyperkalemia and metabolic acidosis. She was started on Abx for sepsis and possible aspiration pneumonia and eventually started on diflucan for esophageal candidiasis. She developed diarrhea and was found to be positive for C diff. She continued to improve, and was transferred out of ICU on 10/19/14. She had speech therapy swallow evaluation which was normal. She developed  progressive dyspnea on 10/21/14. She was treated with BiPAP and lasix, and transferred back to ICU. Her respiratory status did not improve, and she was intubated. She was then transferred to [facility] for further treatment. Pt was extubated 1/5.     Assessment / Plan / Recommendation Clinical Impression  Dysphagia Diagnosis: Mild pharyngeal phase dysphagia Clinical impression: Pt has a mild pharyngeal dysphagia due to delay in swallow initiation paired with decreased airway closure, likely acute and reversible in nature secondary to irritation from intubation. Pilar Plate penetration occurs with thin liquids, with reflexive throat clearing as penetrates reach the level of the vocal cords. Given recent respiratory distress and current altered mentation, recommend Dys 3 diet and nectar thick liquids to maximize safety with intake. Prognosis is good for advancement with additional time post extubation and improved mentation.    Treatment Recommendation  Therapy as outlined in treatment plan below    Diet Recommendation Dysphagia 3 (Mechanical Soft);Nectar-thick liquid   Liquid Administration via: Cup;No straw Medication Administration: Whole meds with puree Supervision: Patient able to self feed;Full supervision/cueing for compensatory strategies Compensations: Slow rate;Small sips/bites Postural Changes and/or Swallow Maneuvers: Seated upright 90 degrees;Upright 30-60 min after meal    Other  Recommendations Oral Care Recommendations: Oral care BID Other Recommendations: Order thickener from pharmacy;Prohibited food (jello, ice cream, thin soups);Remove water pitcher   Follow Up Recommendations  None    Frequency and Duration min 2x/week  2 weeks   Pertinent Vitals/Pain n/a    SLP Swallow Goals     General HPI: EGD on 09/30/14 due to N/V and difficulty swallowing. 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 She was sent to ER on 10/12/14 with vomiting blood and acute encephalopathy. She was also in acute renal failure with hyperkalemia and metabolic acidosis. She was started on Abx for sepsis and possible aspiration pneumonia and eventually started on diflucan for esophageal candidiasis. She developed diarrhea and was found to be positive for C diff. She continued to improve, and was transferred out of ICU on 10/19/14. She had speech therapy swallow evaluation which was normal. She developed progressive dyspnea on 10/21/14. She was treated with BiPAP and lasix, and transferred back to ICU. Her respiratory status did not improve, and she was intubated. She was then transferred to [facility] for further treatment. Pt was extubated 1/5. Type of Study: Modified Barium Swallowing Study Reason for Referral: Objectively evaluate swallowing function Previous Swallow Assessment: BSE12/29 WFL Diet Prior to this Study: NPO;IV Temperature Spikes Noted: No Respiratory Status: Nasal cannula History of Recent Intubation: Yes Length of Intubations (days): 6 days Date extubated: 10/26/14 Behavior/Cognition: Alert;Cooperative;Confused;Distractible  Oral Cavity - Dentition: Adequate natural dentition Self-Feeding Abilities: Needs assist Patient Positioning: Upright in chair Baseline Vocal Quality: Clear;Low vocal intensity Volitional Cough: Weak Volitional Swallow: Able to elicit (after prolonged delay) Anatomy: Within functional limits Pharyngeal Secretions: Not observed secondary MBS    Reason for Referral Objectively evaluate swallowing function   Oral Phase Oral Preparation/Oral Phase Oral Phase: WFL   Pharyngeal Phase Pharyngeal Phase Pharyngeal Phase: Impaired Pharyngeal - Nectar Pharyngeal - Nectar Cup: Delayed swallow initiation;Reduced airway/laryngeal closure Pharyngeal - Thin Pharyngeal - Thin Cup: Delayed swallow initiation;Reduced airway/laryngeal closure;Penetration/Aspiration  during swallow Penetration/Aspiration details (thin cup): Material enters airway, remains ABOVE vocal cords and not ejected out Pharyngeal - Solids Pharyngeal - Puree: Delayed swallow initiation;Reduced airway/laryngeal closure Pharyngeal - Mechanical Soft: Delayed swallow initiation;Reduced airway/laryngeal closure  Cervical Esophageal Phase    GO    Cervical Esophageal Phase Cervical Esophageal Phase: [facility]        Germain Osgood, M.A. CCC-SLP [phone]  Germain Osgood 10/27/2014, 1:10 PM

## 2014-10-27 NOTE — Progress Notes (Signed)
Pt's stats down to 79% and not coming up via 6L [place]. Placed pt on venti mask and sats increased to 99%. Called Dr. Elsworth Soho and new orders carried out.

## 2014-10-28 DIAGNOSIS — F32A Depression, unspecified: Secondary | ICD-10-CM | POA: Diagnosis present

## 2014-10-28 DIAGNOSIS — F329 Major depressive disorder, single episode, unspecified: Secondary | ICD-10-CM | POA: Diagnosis present

## 2014-10-28 LAB — BASIC METABOLIC PANEL
Anion gap: 9 (ref 5–15)
BUN: 5 mg/dL — AB (ref 6–23)
CHLORIDE: 103 meq/L (ref 96–112)
CO2: 29 mmol/L (ref 19–32)
Calcium: 8.5 mg/dL (ref 8.4–10.5)
Creatinine, Ser: 0.89 mg/dL (ref 0.50–1.10)
GFR calc Af Amer: 86 mL/min — ABNORMAL LOW (ref >90)
GFR, EST NON AFRICAN AMERICAN: 74 mL/min — AB (ref >90)
GLUCOSE: 92 mg/dL (ref 70–99)
POTASSIUM: 3.8 mmol/L (ref 3.5–5.1)
SODIUM: 141 mmol/L (ref 135–145)

## 2014-10-28 LAB — CBC
HEMATOCRIT: 22.4 % — AB (ref 36.0–46.0)
HEMOGLOBIN: 7.1 g/dL — AB (ref 12.0–15.0)
MCH: 27 pg (ref 26.0–34.0)
MCHC: 31.7 g/dL (ref 30.0–36.0)
MCV: 85.2 fL (ref 78.0–100.0)
Platelets: 408 10*3/uL — ABNORMAL HIGH (ref 150–400)
RBC: 2.63 MIL/uL — ABNORMAL LOW (ref 3.87–5.11)
RDW: 14.8 % (ref 11.5–15.5)
WBC: 7.1 10*3/uL (ref 4.0–10.5)

## 2014-10-28 LAB — GLUCOSE, CAPILLARY
GLUCOSE-CAPILLARY: 101 mg/dL — AB (ref 70–99)
GLUCOSE-CAPILLARY: 102 mg/dL — AB (ref 70–99)
GLUCOSE-CAPILLARY: 122 mg/dL — AB (ref 70–99)
Glucose-Capillary: 98 mg/dL (ref 70–99)

## 2014-10-28 LAB — PHOSPHORUS: PHOSPHORUS: 3.8 mg/dL (ref 2.3–4.6)

## 2014-10-28 LAB — MAGNESIUM: MAGNESIUM: 2.1 mg/dL (ref 1.5–2.5)

## 2014-10-28 MED ORDER — ENSURE PUDDING PO PUDG
1.0000 | Freq: Three times a day (TID) | ORAL | Status: DC
  Administered 2014-10-28 – 2014-11-04 (×17): 1 via ORAL

## 2014-10-28 MED ORDER — RESOURCE THICKENUP CLEAR PO POWD
ORAL | Status: DC | PRN
  Filled 2014-10-28 (×2): qty 125

## 2014-10-28 MED ORDER — FUROSEMIDE 10 MG/ML IJ SOLN
40.0000 mg | Freq: Two times a day (BID) | INTRAMUSCULAR | Status: DC
  Administered 2014-10-28 – 2014-10-29 (×3): 40 mg via INTRAVENOUS
  Filled 2014-10-28 (×5): qty 4

## 2014-10-28 MED ORDER — ALBUTEROL SULFATE (2.5 MG/3ML) 0.083% IN NEBU: 2.5000 mg | INHALATION_SOLUTION | RESPIRATORY_TRACT | Status: DC | PRN

## 2014-10-28 NOTE — Progress Notes (Signed)
Pt unable to void, put on bedpan without results. Bladder scan shows 363ml. Called resident Anderson Malta who is aware. Will continue to monitor.

## 2014-10-28 NOTE — Evaluation (Signed)
Occupational Therapy Evaluation Patient Details Name: [name] MRN: [id number] DOB: 26-Feb-1961 Today's Date: 10/28/2014    History of Present Illness This 54 y.o. female admitted to [facility] 12/22 with sepsis, aspiration PNA, acute encephalopathy, AKI from obstructive uropathy.  CT and MRI of brain with no acute abnormalities; MRI of thoracic and lumbar spine showed mild degenerative disease, mild spondylosis L4-5; L5-S1; Pt found to be + for C-diff.  She was transferred to [facility] 12/31 with VDRF likely due to pulmonary edema.  Pt + for encephalopathy.  PMH includes: HIV +; Bipolar disorder   Clinical Impression   Pt admitted with above. She demonstrates the below listed deficits and will benefit from continued OT to maximize safety and independence with BADLs.  Pt presents to OT with cognitive deficits including decreased safety and judgement; impaired problem solving and sequencing, and impaired attention; generalized weakness and impaired balance. Currently, she requires mod - total A for BADLs and mod A for functional transfers.  She will require 24 hour physical assist at discharge.  Family is not present, but unsure that they can provide necessary level of care.  SNF level rehab recommended.       Follow Up Recommendations  SNF;Supervision/Assistance - 24 hour    Equipment Recommendations  3 in 1 bedside comode    Recommendations for Other Services       Precautions / Restrictions Precautions Precautions: Fall Precaution Comments: C diff precautions Restrictions Weight Bearing Restrictions: No      Mobility Bed Mobility Overal bed mobility: Needs Assistance Bed Mobility: Supine to Sit     Supine to sit: Mod assist     General bed mobility comments: Pt requires assist to guide LEs off EOB, and assit to lift trunk   Transfers Overall transfer level: Needs assistance Equipment used: 1 person hand held assist Transfers: Sit to/from [facility] Sit to  Stand: Mod assist Stand pivot transfers: Mod assist       General transfer comment: Pt requires assist/facilitation for forward translation of tunk as she tends to lean posteriorly.  Assist to power up and assist to maintain balance     Balance Overall balance assessment: Needs assistance Sitting-balance support: Feet supported Sitting balance-Leahy Scale: Poor     Standing balance support: Bilateral upper extremity supported Standing balance-Leahy Scale: Poor                              ADL Overall ADL's : Needs assistance/impaired Eating/Feeding: Maximal assistance;Sitting   Grooming: Wash/dry hands;Wash/dry face;Moderate assistance;Sitting Grooming Details (indicate cue type and reason): due to impaired sensation  Upper Body Bathing: Total assistance;Sitting   Lower Body Bathing: Total assistance;Sit to/from stand   Upper Body Dressing : Total assistance;Sitting   Lower Body Dressing: Total assistance;Sit to/from stand   Toilet Transfer: Moderate assistance;Stand-pivot Toilet Transfer Details (indicate cue type and reason): Pt with posterior bias.  requires cues and facilitation for forward translation of trunk.  Requires assist to power up and to maintain balance  Toileting- Clothing Manipulation and Hygiene: Total assistance;Sit to/from stand       Functional mobility during ADLs: Moderate assistance General ADL Comments: Pt with impaired attention, and fatigues quickly      Vision                 Additional Comments: Pt not able to accurately participate   Perception     Praxis Praxis Praxis tested?: Deficits Deficits: Initiation  Pertinent Vitals/Pain Pain Assessment: Faces Faces Pain Scale: Hurts even more Pain Location: abdomen  Pain Descriptors / Indicators: Cramping Pain Intervention(s): Monitored during session;Repositioned;Patient requesting pain meds-RN notified     Hand Dominance Right   Extremity/Trunk Assessment  Upper Extremity Assessment Upper Extremity Assessment: RUE deficits/detail;LUE deficits/detail RUE Deficits / Details: shoulder grossly 2/5.  Elbow distally grossly 4-/5 LUE Deficits / Details: shoulder grossly 2/5.  Elbow distally grossly 4-/5   Lower Extremity Assessment Lower Extremity Assessment: Defer to PT evaluation   Cervical / Trunk Assessment Cervical / Trunk Assessment: Other exceptions Cervical / Trunk Exceptions: Pt with flexed posture    Communication Communication Communication:  (Pt with minimal verbalization )   Cognition Arousal/Alertness: Awake/alert;Lethargic Behavior During Therapy: Flat affect Overall Cognitive Status: Impaired/Different from baseline Area of Impairment: Orientation;Attention;Following commands;Safety/judgement;Awareness;Problem solving Orientation Level: Disoriented to;Situation (requires min verbal cues for time) Current Attention Level: Sustained Memory: Decreased short-term memory Following Commands: Follows one step commands consistently;Follows one step commands with increased time (cues need to follow commands ) Safety/Judgement: Decreased awareness of deficits;Decreased awareness of safety   Problem Solving: Slow processing;Decreased initiation;Difficulty sequencing;Requires verbal cues;Requires tactile cues     General Comments       Exercises       Shoulder Instructions      Home Living Family/patient expects to be discharged to:: Skilled nursing facility     Type of Home: House Home Access: Stairs to enter Entrance Stairs-Number of Steps: 1 Entrance Stairs-Rails: None Home Layout: One level               Home Equipment: Walker - 2 wheels;Bedside commode;Shower seat;Grab bars - toilet   Additional Comments: Pt unable to provide info re: PLOF or home environment and no family present       Prior Functioning/Environment Level of Independence: Needs assistance        Comments: Per chart review pt ambulated  with RW and required assist with ADL.  Pt unable to provide this info and no family present to confirm     OT Diagnosis: Generalized weakness;Cognitive deficits;Acute pain   OT Problem List: Decreased strength;Decreased range of motion;Decreased activity tolerance;Impaired balance (sitting and/or standing);Decreased cognition;Decreased safety awareness;Decreased knowledge of use of DME or AE;Cardiopulmonary status limiting activity;Impaired UE functional use;Pain   OT Treatment/Interventions: Self-care/ADL training;Therapeutic exercise;Neuromuscular education;DME and/or AE instruction;Therapeutic activities;Cognitive remediation/compensation;Patient/family education;Balance training    OT Goals(Current goals can be found in the care plan section) Acute Rehab OT Goals OT Goal Formulation: Patient unable to participate in goal setting Potential to Achieve Goals: Good ADL Goals Pt Will Perform Grooming: with min assist;standing Pt Will Perform Upper Body Bathing: with min assist;sitting Pt Will Perform Lower Body Bathing: with mod assist;sit to/from stand Pt Will Perform Upper Body Dressing: with min assist;sitting Pt Will Perform Lower Body Dressing: with mod assist;sit to/from stand Pt Will Transfer to Toilet: with min assist;ambulating;regular height toilet;bedside commode;grab bars Pt Will Perform Toileting - Clothing Manipulation and hygiene: with min assist;sit to/from stand  OT Frequency: Min 2X/week   Barriers to D/C: Decreased caregiver support          Co-evaluation              End of Session Equipment Utilized During Treatment: Oxygen Nurse Communication: Mobility status;Other (comment) (vitals )  Activity Tolerance: Patient limited by fatigue Patient left: in chair;with call bell/phone within reach;with nursing/sitter in room   Time: 7564-3329 OT Time Calculation (min): 24 min Charges:  OT General Charges $OT Visit:  1 Procedure OT Evaluation $Initial OT  Evaluation Tier I: 1 Procedure OT Treatments $Therapeutic Activity: 8-22 mins G-Codes:    Cadon Raczka M 11/15/2014, 3:00 PM

## 2014-10-28 NOTE — Progress Notes (Signed)
Patient transferred to room 2C17 nurse received. IV team consulted to replace PIV as patient complained of pain upon flushing.  Receiving nurse advised of patient in and out cath at 6:30 am, no urine since. Patient received 40 IV lasix prior to transport.

## 2014-10-28 NOTE — Clinical Social Work Note (Addendum)
Per CSW note posted on 10/19/2014 pt declined SNF placement and reported she would return home with Home Health services.  Clinical Social Worker spoke to pt's sister, Mady Gemma via telephone [phone] in reference to post-acute placement for SNF. CSW explained SNF process. Pt's sister reported pt previously would not consider SNF placement and planned to return home with Home Health services/ Outpatient Rehab. Pt's sister reported she would like to discuss SNF option with her daughter and contact CSW at a later date. CSW will continue to follow patient and pt's family for continued support and to facilitate pt's discharge needs once medically stable.   Pt is alert and disoriented x3. CSW awaiting a returned phone call from pt's sister. CSW to follow up.   [place]on Axe, MSW, LCSWA [phone] 10/28/2014 10:51 AM

## 2014-10-28 NOTE — Progress Notes (Signed)
Speech Language Pathology Treatment: Dysphagia  Patient Details Name: [name] MRN: [id number] DOB: August 25, 1961 Today's Date: 10/28/2014 Time: 9381-0175 SLP Time Calculation (min) (ACUTE ONLY): 9 min  Assessment / Plan / Recommendation Clinical Impression  SLP at bedside during am meal to f/u for diet tolerance and use of compensatory strategies. SLP provided min physical assist with self feeding to increase safety of swallow. No overt s/s of aspiration observed with pos provided however patient required min assist for use of safe swallowing strategies including small sip size and no straws. Sitter present also educated regarding recommendations for only small cup sips, no straws, at this time. Re-educated patient on results of MBS and rationale for recommendations when asking for thin liquids. Patient verbalized understanding but will need f/u education given poor carryover of information with current AMS. Overall, patient appears to be tolerating diet. Prognosis for advancement good with time off vent and improved mentation.    HPI HPI: EGD on 09/30/14 due to N/V and difficulty swallowing. 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 She was sent to ER on 10/12/14 with vomiting blood and acute encephalopathy. She was also in acute renal failure with hyperkalemia and metabolic acidosis. She was started on Abx for sepsis and possible aspiration pneumonia and eventually started on diflucan for esophageal candidiasis. She developed diarrhea and was found to be positive for C diff. She continued to improve, and was transferred out of ICU on 10/19/14. She had speech therapy swallow evaluation which was normal. She developed progressive dyspnea on 10/21/14. She was treated with BiPAP and lasix, and transferred back to ICU. Her respiratory status did not improve, and she was intubated. She was then transferred to  [facility] for further treatment. Pt was extubated 1/5.   Pertinent Vitals Pain Assessment: No/denies pain  SLP Plan  Continue with current plan of care    Recommendations Diet recommendations: Dysphagia 3 (mechanical soft);Nectar-thick liquid Liquids provided via: Cup;No straw Medication Administration: Whole meds with puree Supervision: Patient able to self feed;Full supervision/cueing for compensatory strategies Compensations: Slow rate;Small sips/bites Postural Changes and/or Swallow Maneuvers: Seated upright 90 degrees;Upright 30-60 min after meal              Oral Care Recommendations: Oral care BID Follow up Recommendations: None Plan: Continue with current plan of care    [place], [place] [phone]   [name] 10/28/2014, 9:13 AM

## 2014-10-28 NOTE — Progress Notes (Signed)
PULMONARY / CRITICAL CARE MEDICINE  Name: [name] MRN: [id number] DOB: 1961-03-23    ADMISSION DATE:  10/21/2014  REFERRING MD :  Verneita Griffes  STUDIES:  12/22 CT head >> no acute abnormalities 12/23 MRI brain >> normal for age 52/23 Renal u/s >> normal 12/24 LP ([facility]) >> protein 29, glucose 73, WBC 0, HSV DNA negative, Cryptococcal Ag negative 12/26 MRI T/L spine >> mild degenerative disease, mild spondylosis L4-L5, L5-S1 10/25/14: pCXR: interstitial opacity diffusely with left lower lobe collapse/consolidation and small effusion 10/27/13: pCXR: Resolution of pulmonary edema, Improving left base atelectasis/ effusion, Residual right base atelectasis.  SIGNIFICANT EVENTS: 12/10 Outpt EGD >> candidal esophagitis 12/22 admit for ARF with obstructive uropathy at [facility] 12/25 Tachycardia >> tx with diltiazem 12/27 Diarrhea 12/29 Speech therapy >> normal swallow eval 12/30 hypotension 12/31 VDRF, transfer to [facility] 10/23/14:  off pressors, On fent gtt  + prn fent  + bicarb gtt -. Follows commands. On 60%, peep 12 PCV 10/24/14: off pressors and bicarb . On PCV 40% fio2, peep 12 10/25/14: Tmax 101.77F overnight, precedex gtt for agitation 10/26/14: Afebrile overnight, hypotensive but off pressors, off precedex. Extubated 10/27/14: Unable to void  SUBJECTIVE/OVERNIGHT/INTERVAL HX In and out cath yesterday x2 yesterday and this morning Moaning this morning, sitter at bedside, still confused, reports having a headache  VITAL SIGNS: Temp:  [98.5 F (36.9 C)-99.4 F (37.4 C)] 98.6 F (37 C) (01/07 0831) Pulse Rate:  [70-122] 114 (01/07 0700) Resp:  [11-21] 14 (01/07 0700) BP: (105-142)/(70-101) 122/90 mmHg (01/07 0700) SpO2:  [83 %-100 %] 100 % (01/07 0735) HEMODYNAMICS:   VENTILATOR SETTINGS:   INTAKE / OUTPUT:  Intake/Output Summary (Last 24 hours) at 10/28/14 0942 Last data filed at 10/28/14 0735  Gross per 24 hour  Intake    800 ml  Output   1100 ml  Net   -300 ml   PHYSICAL  EXAMINATION: General: lying in bed Neuro:  Follows commands and responding to questions. Awake but still confused and slow to response HEENT:  EOMI Cardiovascular:  Tachycardia Lungs:  Shallow respirations, on 3L [place] o2 Abdomen:  Soft, +bs, mild tenderness to deep palpation Musculoskeletal:  Moving extremities, tenderness to palpation of b/l extremities that she says is always sore Skin:  Dry  LABS:  PULMONARY  Recent Labs Lab 10/21/14 2228 10/22/14 0115 10/23/14 0404 10/24/14 0511 10/26/14 0540  PHART 7.358 7.335* 7.436 7.451* 7.472*  PCO2ART 30.0* 28.9* 39.8 51.0* 45.4*  PO2ART 50.9* 67.0* 62.0* 83.9 74.6*  HCO3 16.4* 15.4* 26.1* 34.8* 32.9*  TCO2 17.4 16 27.3 36.3 34.3  O2SAT 79.7 92.0 88.5 95.6 94.8   CBC  Recent Labs Lab 10/26/14 0500 10/27/14 0515 10/28/14 0258  HGB 7.0* 7.2* 7.1*  HCT 22.1* 23.2* 22.4*  WBC 8.6 9.1 7.1  PLT 374 428* 408*   CARDIAC    Recent Labs Lab 10/21/14 1545  TROPONINI <0.03   CHEMISTRY  Recent Labs Lab 10/24/14 0510  10/24/14 2222 10/25/14 0500 10/25/14 1112 10/26/14 0500 10/26/14 1740 10/27/14 0515 10/28/14 0258  NA 141  --   --  143  --  145  --  143 141  K 3.6  --   --  4.1  --  4.1  --  4.2 3.8  CL 100  --   --  96  --  104  --  106 103  CO2 36*  --   --  39*  --  33*  --  25 29  GLUCOSE 120*  --   --  117*  --  118*  --  74 92  BUN <5*  --   --  <5*  --  <5*  --  5* 5*  CREATININE 0.93  --   --  1.06  --  0.92  --  1.02 0.89  CALCIUM 7.7*  --   --  8.2*  --  8.2*  --  8.6 8.5  MG 1.5  < > 2.6* 2.2 1.9 1.6 1.6 1.9 2.1  PHOS 3.8  < > 2.2* 3.2 3.1 2.7  --   --  3.8  < > = values in this interval not displayed. Estimated Creatinine Clearance: 64.6 mL/min (by C-G formula based on Cr of 0.89).  LIVER  Recent Labs Lab 10/21/14 2300  AST 27  ALT 17  ALKPHOS 100  BILITOT 0.4  PROT 5.1*  ALBUMIN 1.8*   INFECTIOUS  Recent Labs Lab 10/21/14 1022 10/21/14 1545 10/21/14 2230 10/22/14 0500 10/23/14 0420   LATICACIDVEN 2.0  --  2.1 2.0  --   PROCALCITON  --  0.38  --  0.60 0.46   ENDOCRINE CBG (last 3)   Recent Labs  10/27/14 1933 10/28/14 0020 10/28/14 0430  GLUCAP 96 101* 122*   IMAGING x48h Dg Chest Port 1 View  10/27/2014   CLINICAL DATA:  Hypoxia.  EXAM: PORTABLE CHEST - 1 VIEW  COMPARISON:  10/26/2014, 10/25/2014 and 10/24/2014  FINDINGS: Endotracheal tube and OG tube have been removed.  Pulmonary edema has resolved. There is slight atelectasis at the right base. Atelectasis/effusion at the left base has improved slightly.  IMPRESSION: 1. Resolution of pulmonary edema. 2. Improving left base atelectasis/ effusion. 3. Residual right base atelectasis.   Electronically Signed   By: Rozetta Nunnery M.D.   On: 10/27/2014 07:31   Dg Swallowing Func-speech Pathology  10/27/2014   Loletha Grayer, CCC-SLP     10/27/2014  1:11 PM Objective Swallowing Evaluation: Modified Barium Swallowing Study   Patient Details  Name: [name] MRN: [id number] Date of Birth: [birth year]  Today's Date: 10/27/2014 Time: 1205-1229 SLP Time Calculation (min) (ACUTE ONLY): 24 min  Past Medical History:  Past Medical History  Diagnosis Date  . Baker's cyst   . Pulmonary lesion 1/03    on RUL onn CT   . Hemorrhoids   . Zoster 2001  . Family history of diabetes mellitus   . Tobacco user   . Other abnormal Papanicolaou smear of cervix and cervical  HPV(795.09)   . Herpes zoster   . Hemorrhoids   . Baker's cyst   . Pancreatitis   . HIV disease     take Prezista,Truvada,Norvir,and Isentress daily  . Bipolar disorder     With hx of psychotic features.  . Articular cartilage disorder of knee   . Bursitis of shoulder   . Rotator cuff arthropathy   . Hypertension     takes Clonidine and Verapamil daily  . Emphysema   . Asthma     Albuterol resue inhaler and QVAR daily  . Shortness of breath     with exertion and stress  . Dizziness   . Joint pain   . Joint swelling   . Chronic back pain     buldging disc  . Bruises easily   . Gastric  ulcer   . GERD (gastroesophageal reflux disease)     takes Nexium daily  . Chronic constipation   . Urinary frequency   . Urinary incontinence   . Nocturia   . History  of blood transfusion at age 61  . Early cataracts, bilateral   . Anxiety   . Depression     takes Xanax daily and Depakote bid  . History of shingles   . 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 Tobacco abuse 10/26/2012  . Chronic back pain   . Chronic knee pain   . Complication of anesthesia     AGGITATED  . Uterine cancer     "had some kind of cancer in my teens; had to have TAH to remove  it"  . Headache(784.0)     "often here lately; weather changing"  . Pneumonia     "several times"  . Arthritis     "all over" (03/16/2014)  . Osteoarthritis of left knee 03/16/2014  . Alcohol withdrawal delirium 03/22/2014   Past Surgical History:  Past Surgical History  Procedure Laterality Date  . Breast lumpectomy Right   . Breast biopsy Right ~ 1980  . Cholecystectomy  10+yrs ago  . Knee arthroscopy Left   . Finger surgery Right     rt middle finger with pin in it  . Epidural injections    . Esophagogastroduodenoscopy    . Shoulder hemi-arthroplasty  05/06/2012    Procedure: SHOULDER HEMI-ARTHROPLASTY;  Surgeon: Johnny Bridge, MD;  Location: [place];  Service: Orthopedics;  Laterality:  Right;  . Joint replacement    . Fracture surgery      Right finger third digit  . Appendectomy  1981    with hysterectomy  . Total knee arthroplasty Left 03/16/2014    Procedure: LEFT TOTAL KNEE ARTHROPLASTY;  Surgeon: Johnny Bridge, MD;  Location: [place];  Service: Orthopedics;  Laterality:  Left;  . Total abdominal hysterectomy  1981    w/BSO  . Esophagogastroduodenoscopy (egd) with propofol N/A 09/30/2014    Procedure: ESOPHAGOGASTRODUODENOSCOPY (EGD) WITH PROPOFOL;   Surgeon: Daneil Dolin, MD;  Location: [facility];  Service:  Endoscopy;  Laterality: N/A;  . Esophageal biopsy  09/30/2014    Procedure: GASTRIC AND ESOPHAGEAL BIOPSY;  Surgeon:  Daneil Dolin, MD;  Location: [facility];  Service: Endoscopy;;   HPI:  EGD on 09/30/14 due to N/V and difficulty swallowing. 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 She was  sent to ER on 10/12/14 with vomiting blood and acute  encephalopathy. She was also in acute renal failure with  hyperkalemia and metabolic acidosis. She was started on Abx for  sepsis and possible aspiration pneumonia and eventually started  on diflucan for esophageal candidiasis. She developed diarrhea  and was found to be positive for C diff. She continued to  improve, and was transferred out of ICU on 10/19/14. She had  speech therapy swallow evaluation which was normal. She developed  progressive dyspnea on 10/21/14. She was treated with BiPAP and  lasix, and transferred back to ICU. Her respiratory status did  not improve, and she was intubated. She was then transferred to  [facility] for further treatment. Pt was extubated 1/5.     Assessment / Plan / Recommendation Clinical Impression  Dysphagia Diagnosis: Mild pharyngeal phase dysphagia Clinical impression: Pt has a mild pharyngeal dysphagia due to  delay in swallow initiation paired with decreased airway closure,  likely acute and reversible in nature secondary to irritation  from intubation. Pilar Plate penetration occurs with thin liquids, with  reflexive throat clearing as penetrates reach the level  of the  vocal cords. Given recent respiratory distress and current  altered mentation, recommend Dys 3 diet and nectar thick liquids  to maximize safety with intake. Prognosis is good for advancement  with additional time post extubation and improved mentation.    Treatment Recommendation  Therapy as outlined in treatment plan below    Diet Recommendation Dysphagia 3 (Mechanical Soft);Nectar-thick  liquid   Liquid Administration via: Cup;No straw Medication Administration: Whole meds with puree  Supervision: Patient able to self feed;Full supervision/cueing  for compensatory strategies Compensations: Slow rate;Small sips/bites Postural Changes and/or Swallow Maneuvers: Seated upright 90  degrees;Upright 30-60 min after meal    Other  Recommendations Oral Care Recommendations: Oral care BID Other Recommendations: Order thickener from pharmacy;Prohibited  food (jello, ice cream, thin soups);Remove water pitcher   Follow Up Recommendations  None    Frequency and Duration min 2x/week  2 weeks   Pertinent Vitals/Pain n/a    SLP Swallow Goals     General HPI: EGD on 09/30/14 due to N/V and difficulty  swallowing. 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 She was sent to ER on 10/12/14 with vomiting blood  and acute encephalopathy. She was also in acute renal failure  with hyperkalemia and metabolic acidosis. She was started on Abx  for sepsis and possible aspiration pneumonia and eventually  started on diflucan for esophageal candidiasis. She developed  diarrhea and was found to be positive for C diff. She continued  to improve, and was transferred out of ICU on 10/19/14. She had  speech therapy swallow evaluation which was normal. She developed  progressive dyspnea on 10/21/14. She was treated with BiPAP and  lasix, and transferred back to ICU. Her respiratory status did  not improve, and she was intubated. She was then transferred to  [facility] for further treatment. Pt was extubated 1/5. Type of Study: Modified Barium Swallowing Study Reason for Referral: Objectively evaluate swallowing function Previous Swallow Assessment: BSE12/29 WFL Diet Prior to this Study: NPO;IV Temperature Spikes Noted: No Respiratory Status: Nasal cannula History of Recent Intubation: Yes Length of Intubations (days): 6 days Date extubated: 10/26/14 Behavior/Cognition: Alert;Cooperative;Confused;Distractible Oral Cavity - Dentition: Adequate  natural dentition Self-Feeding Abilities: Needs assist Patient Positioning: Upright in chair Baseline Vocal Quality: Clear;Low vocal intensity Volitional Cough: Weak Volitional Swallow: Able to elicit (after prolonged delay) Anatomy: Within functional limits Pharyngeal Secretions: Not observed secondary MBS    Reason for Referral Objectively evaluate swallowing function   Oral Phase Oral Preparation/Oral Phase Oral Phase: WFL   Pharyngeal Phase Pharyngeal Phase Pharyngeal Phase: Impaired Pharyngeal - Nectar Pharyngeal - Nectar Cup: Delayed swallow initiation;Reduced  airway/laryngeal closure Pharyngeal - Thin Pharyngeal - Thin Cup: Delayed swallow initiation;Reduced  airway/laryngeal closure;Penetration/Aspiration during swallow Penetration/Aspiration details (thin cup): Material enters  airway, remains ABOVE vocal cords and not ejected out Pharyngeal - Solids Pharyngeal - Puree: Delayed swallow initiation;Reduced  airway/laryngeal closure Pharyngeal - Mechanical Soft: Delayed swallow initiation;Reduced  airway/laryngeal closure  Cervical Esophageal Phase    GO    Cervical Esophageal Phase Cervical Esophageal Phase: [facility]        Germain Osgood, M.A. CCC-SLP [phone]  Germain Osgood 10/27/2014, 1:10 PM    ASSESSMENT / PLAN:  PULMONARY ETT 12/31 >>1/5 A: Acute respiratory failure 2nd to aspiration PNA--on 3L [place] O2 Hx of emphysema. Continued tobacco abuse. Pulm edema--improving   P:   BDs PRN Hold ICS for now Spiriva on hold Cont neg  Balance  as able, see pulm edema on pcxr - lasix AM CXR  CARDIOVASCULAR Rt femoral CVL ([facility] in ICU by EDP) 12/31 >>1/6 Tachycardia A:  Severe sepsis--resolved Hx of HTN.  - off pressors as of 10/24/14. ECHO 10/22/14 - EF 65% with trace pericardial effusion -Hypotension--resolved P:  Pulmonary edema avoid pos balance Tele remain  RENAL A:   AKI 2nd to obstructive uropathy (? spiriva )--resolved Difficulty voiding--monitor urine output closely, may need  foley again?  Hypomag--resolved Vaginal discomfort  Net neg 9.1L since admission  P:   Has gotten I&O cath since yesterday May repeat lasix to bid bmet in am   GASTROINTESTINAL A:   Esophageal candidiasis. Diarrhea due to C diff colitis. P:   Pepcid for SUP Dys 3 Avoiding ppi with cdiff  HEMATOLOGIC A:   Anemia of critical illness and chronic disease--Hb stable at 7   Recent Labs Lab 10/24/14 0510 10/25/14 0500 10/26/14 0500 10/27/14 0515 10/28/14 0258  HCT 21.6* 22.6* 22.1* 23.2* 22.4*   P:  F/u CBC Transfuse for Hb < 7, may need to transfuse if continues to drop, lasix may help concetrate Lovenox, crt in am   INFECTIOUS  Blood 12/22 >> negative Urine 12/23 >> negative CSF 12/24 >> negative C diff PCR 12/27 >> POSITIVE Urine 12/27 >> negative Blood 12/27 >>No growth Blood 12/31 >>No growth Urine 12/31 >>Pseudomonas--resistant Imipenem, zosyn sensitive Sputum 12/31 >>Non-pathogenic oropharyngeal type flora A:   Severe sepsis--resolved C diff colitis--on po Vanc Aspiration PNA--Zosyn has been d/c'd Esophageal candidiasis--On fluconazole Possible UTI--Pseudomonas on cx Hx of HIV--normal CD4 sept 2015  P:   PER ID  Total days of antibiotics: 16  PO vanco 12/27>>> IV Zosyn 12/31>>1/4 Fluconazole 12/27>>1/6 Fosfomycin 1/4  ENDOCRINE A:   No acute issues.  P:   Monitor blood sugar on BMET  NEUROLOGIC A:   Acute encephalopathy 2nd to respiratory failure, sepsis, AKI--still some confusion Hx of Bipolar disorder Generalized weakness  P:   -PT/OT Consider palliative care consult after further d./w family Transfer to tele--[facility] Await bed tele  Family contacted by SW--deciding on dispo  Inter-disciplinary family meet or Palliative Care meeting due by 10/28/14:   Signed: Jerene Pitch, MD PGY-3, Internal Medicine Resident Pager: [phone]  10/28/2014,9:42 AM  STAFF NOTE: Linwood Dibbles, MD FACP have personally reviewed patient's  available data, including medical history, events of note, physical examination and test results as part of my evaluation. I have discussed with resident/NP and other care providers such as pharmacist, RN and RRT. In addition, I personally evaluated patient and elicited key findings of: No distres, lasix to increase bid, continued ABX per ID, bemt in am , hct needed, foley required  Lavon Paganini. Titus Mould, MD, [place] Pgr: [facility] 10/28/2014 11:27 AM

## 2014-10-28 NOTE — Progress Notes (Signed)
NUTRITION FOLLOW UP  Intervention:   Ensure Pudding PO TID, each supplement provides 170 kcal and 4 grams of protein Magic cup TID with meals, each supplement provides 290 kcal and 9 grams of protein  Nutrition Dx:   Inadequate oral intake now related to poor appetite and dysphagia as evidenced by < 50% meal completion, ongoing.  Goal:   Intake to meet >90% of estimated nutrition needs. Unmet.  Monitor:   PO intake, labs, weight trend.  Assessment:   Patient was extubated on 1/5. History of swallowing problems. S/P MBS with SLP on 1/6. Diet has been advanced to dysphagia 3 with nectar thick liquids. She consumed ~20% of breakfast this AM per discussion with nurse tech who fed her breakfast.  Patient with 15% weight loss in 3 months since October. Weight seems to fluctuate a lot. Patient is at nutrition risk given 15% weight loss and recent poor PO intake.  Height: Ht Readings from Last 1 Encounters:  10/20/14 5\' 4"  (1.626 m)    Weight Status:   Wt Readings from Last 1 Encounters:  10/27/14 143 lb 15.4 oz (65.3 kg)   10/21/14 168 lb 6.9 oz (76.4 kg)   12/22/15145 lb (66.1 kg)  Re-estimated needs:  Kcal: 1400-1600 Protein: 70-80 gm Fluid: 1.5 L  Skin: intact  Diet Order: DIET DYS 3 with nectar thick liquids    Intake/Output Summary (Last 24 hours) at 10/28/14 1230 Last data filed at 10/28/14 1128  Gross per 24 hour  Intake    925 ml  Output   1100 ml  Net   -175 ml    Last BM: 1/5   Labs:   Recent Labs Lab 10/25/14 1112 10/26/14 0500 10/26/14 1740 10/27/14 0515 10/28/14 0258  NA  --  145  --  143 141  K  --  4.1  --  4.2 3.8  CL  --  104  --  106 103  CO2  --  33*  --  25 29  BUN  --  <5*  --  5* 5*  CREATININE  --  0.92  --  1.02 0.89  CALCIUM  --  8.2*  --  8.6 8.5  MG 1.9 1.6 1.6 1.9 2.1  PHOS 3.1 2.7  --   --  3.8  GLUCOSE  --  118*  --  74 92    CBG (last 3)   Recent Labs  10/27/14 1933 10/28/14 0020 10/28/14 0430   GLUCAP 96 101* 122*    Scheduled Meds: . antiseptic oral rinse  7 mL Mouth Rinse q12n4p  . darunavir  800 mg Per NG tube Q breakfast  . elvitegravir-cobicistat-emtricitabine-tenofovir  1 tablet Oral Q breakfast  . enoxaparin (LOVENOX) injection  40 mg Subcutaneous Q24H  . furosemide  40 mg Intravenous BID  . ipratropium-albuterol  3 mL Nebulization Q4H  . nystatin-triamcinolone   Topical BID  . potassium chloride  40 mEq Per Tube BID  . vancomycin  125 mg Per Tube QID    Continuous Infusions:    Molli Barrows, RD, LDN, CNSC Pager [phone] After Hours Pager [phone]

## 2014-10-28 NOTE — Progress Notes (Signed)
Pt still unable to void with bladder scan showing 404 ml. Resident Richard order to I & O cath with 400 ml out. Will continue to monitor.

## 2014-10-28 NOTE — Clinical Social Work Note (Signed)
Clinical Social Worker spoke with pt's sister, [place] via telephone in reference to SNF placement. Pt's sister stated she did not want to go against pt's wishes to return home with [place] services/ Outpatient Rehab and further reported pt lives with her boyfriend. Pt's sister also reported she has not discussed SNF option with her daughter and would value her input.   CSW reported pt had moved to [facility] and was assigned to new CSW. Pt's sister requested to meet with assigned CSW to discuss therapy options. CSW provided handoff to assigned CSW of 2C with updates.   This CSW signing off.   Glendon Axe, MSW, LCSWA [phone] 10/28/2014 4:37 PM

## 2014-10-29 ENCOUNTER — Inpatient Hospital Stay (HOSPITAL_COMMUNITY): Payer: Medicare Other

## 2014-10-29 DIAGNOSIS — R4182 Altered mental status, unspecified: Secondary | ICD-10-CM

## 2014-10-29 DIAGNOSIS — D649 Anemia, unspecified: Secondary | ICD-10-CM

## 2014-10-29 LAB — URINALYSIS, ROUTINE W REFLEX MICROSCOPIC
Bilirubin Urine: NEGATIVE
Glucose, UA: NEGATIVE mg/dL
Ketones, ur: NEGATIVE mg/dL
NITRITE: NEGATIVE
PROTEIN: NEGATIVE mg/dL
SPECIFIC GRAVITY, URINE: 1.014 (ref 1.005–1.030)
Urobilinogen, UA: 0.2 mg/dL (ref 0.0–1.0)
pH: 7.5 (ref 5.0–8.0)

## 2014-10-29 LAB — URINE MICROSCOPIC-ADD ON

## 2014-10-29 LAB — BASIC METABOLIC PANEL
ANION GAP: 7 (ref 5–15)
CO2: 25 mmol/L (ref 19–32)
CREATININE: 0.96 mg/dL (ref 0.50–1.10)
Calcium: 8.4 mg/dL (ref 8.4–10.5)
Chloride: 104 mEq/L (ref 96–112)
GFR calc Af Amer: 78 mL/min — ABNORMAL LOW (ref >90)
GFR, EST NON AFRICAN AMERICAN: 67 mL/min — AB (ref >90)
GLUCOSE: 104 mg/dL — AB (ref 70–99)
Potassium: 4.4 mmol/L (ref 3.5–5.1)
Sodium: 136 mmol/L (ref 135–145)

## 2014-10-29 LAB — RETICULOCYTES
RBC.: 2.83 MIL/uL — AB (ref 3.87–5.11)
RETIC COUNT ABSOLUTE: 220.7 10*3/uL — AB (ref 19.0–186.0)
RETIC CT PCT: 7.8 % — AB (ref 0.4–3.1)

## 2014-10-29 LAB — IRON AND TIBC
Iron: 93 ug/dL (ref 42–145)
Saturation Ratios: 45 % (ref 20–55)
TIBC: 206 ug/dL — ABNORMAL LOW (ref 250–470)
UIBC: 113 ug/dL — ABNORMAL LOW (ref 125–400)

## 2014-10-29 LAB — PREPARE RBC (CROSSMATCH)

## 2014-10-29 LAB — TSH: TSH: 4.645 u[IU]/mL — AB (ref 0.350–4.500)

## 2014-10-29 LAB — TROPONIN I
Troponin I: <0.03 ng/mL (ref <0.031)
Troponin I: <0.03 ng/mL (ref <0.031)

## 2014-10-29 MED ORDER — VERAPAMIL HCL ER 240 MG PO TBCR
240.0000 mg | EXTENDED_RELEASE_TABLET | Freq: Every day | ORAL | Status: DC
  Administered 2014-10-29: 240 mg via ORAL
  Filled 2014-10-29: qty 1

## 2014-10-29 MED ORDER — SODIUM CHLORIDE 0.9 % IV SOLN
Freq: Once | INTRAVENOUS | Status: AC
  Administered 2014-10-29: 10 mL/h via INTRAVENOUS

## 2014-10-29 MED ORDER — SODIUM CHLORIDE 0.9 % IV BOLUS (SEPSIS): 250.0000 mL | Freq: Once | INTRAVENOUS | Status: DC

## 2014-10-29 MED ORDER — VERAPAMIL HCL ER 180 MG PO TBCR
180.0000 mg | EXTENDED_RELEASE_TABLET | Freq: Every day | ORAL | Status: DC
  Filled 2014-10-29: qty 1

## 2014-10-29 MED ORDER — CLONIDINE HCL 0.1 MG PO TABS
0.1000 mg | ORAL_TABLET | Freq: Every day | ORAL | Status: DC
  Filled 2014-10-29: qty 1

## 2014-10-29 MED ORDER — GI COCKTAIL ~~LOC~~
30.0000 mL | Freq: Once | ORAL | Status: AC
  Administered 2014-10-29: 30 mL via ORAL
  Filled 2014-10-29: qty 30

## 2014-10-29 MED ORDER — HYDROCODONE-ACETAMINOPHEN 5-325 MG PO TABS
1.0000 | ORAL_TABLET | ORAL | Status: DC | PRN
  Administered 2014-10-29 – 2014-11-03 (×11): 1 via ORAL
  Filled 2014-10-29 (×11): qty 1

## 2014-10-29 MED ORDER — ALBUTEROL SULFATE (2.5 MG/3ML) 0.083% IN NEBU: 2.5000 mg | INHALATION_SOLUTION | RESPIRATORY_TRACT | Status: DC | PRN

## 2014-10-29 MED ORDER — LISINOPRIL 20 MG PO TABS
20.0000 mg | ORAL_TABLET | Freq: Every day | ORAL | Status: DC
  Administered 2014-10-29: 20 mg via ORAL
  Filled 2014-10-29: qty 1

## 2014-10-29 NOTE — Clinical Social Work Note (Signed)
CSW called patients sister to confirm choice to home with home health services- unable to reach- left message.  CSW will continue to follow.  Domenica Reamer, [place] Social Worker [phone]

## 2014-10-29 NOTE — Progress Notes (Signed)
Notified Dr. Thereasa Solo of pt's low blood pressure both manually and via machine. Pt rec'ving 2 unit of PRBC at this time. Pt resting and asymptomatic.

## 2014-10-29 NOTE — Progress Notes (Signed)
Physical Therapy Treatment Patient Details Name: [name] MRN: [id number] DOB: 1960-11-30 Today's Date: 10/29/2014    History of Present Illness This 54 y.o. female admitted to [facility] 12/22 with sepsis, aspiration PNA, acute encephalopathy, AKI from obstructive uropathy.  CT and MRI of brain with no acute abnormalities; MRI of thoracic and lumbar spine showed mild degenerative disease, mild spondylosis L4-5; L5-S1; Pt found to be + for C-diff.  She was transferred to [facility] 12/31 with VDRF likely due to pulmonary edema.  Pt + for encephalopathy.  PMH includes: HIV +; Bipolar disorder    PT Comments    Patient with poor ability to perform ambulation or functional tasks this session. Required +2 moderate to maximal assist for minimal activity.  Patient BP 110s/60s, SpO2 >95% on 2 liters with activity. Patient reports abdominal pain and discomfort. Of note: Hgb low.  Will continue to see and progress as tolerated. Continue to feel patient will need ST SNF upon acute discharge. Questionable patient cognition and awareness of deficits at this time.  Follow Up Recommendations  SNF     Equipment Recommendations  None recommended by PT    Recommendations for Other Services OT consult     Precautions / Restrictions Precautions Precautions: Fall Precaution Comments: C diff precautions Restrictions Weight Bearing Restrictions: No    Mobility  Bed Mobility               General bed mobility comments: pt received sitting on BSC  Transfers Overall transfer level: Needs assistance Equipment used: 1 person hand held assist Transfers: Sit to/from Stand;Stand Pivot Transfers Sit to Stand: Mod assist;+2 physical assistance Stand pivot transfers: Mod assist;+2 physical assistance       General transfer comment: Significant posterior lean, +2 assist for stability and cues to get LEs under BOS.  Ambulation/Gait Ambulation/Gait assistance: Max assist;+2 physical assistance Ambulation  Distance (Feet): 8 Feet Assistive device: Rolling walker (2 wheeled) Gait Pattern/deviations: Step-to pattern;Decreased step length - right;Decreased step length - left;Decreased stride length;Shuffle;Scissoring;Ataxic;Staggering left;Staggering right;Trunk flexed;Narrow base of support Gait velocity: decreased Gait velocity interpretation: Below normal speed for age/gender General Gait Details: patient required increased assist +2 for stability, significant posterior lean, assit to postion BOS over RW, moderate assist of 2 for stability intially but as patient progressed each step patient began to require max to total assist.   Stairs            Wheelchair Mobility    Modified Rankin (Stroke Patients Only)       Balance   Sitting-balance support: Feet supported Sitting balance-Leahy Scale: Poor     Standing balance support: Bilateral upper extremity supported;During functional activity Standing balance-Leahy Scale: Poor                      Cognition Arousal/Alertness: Awake/alert;Lethargic Behavior During Therapy: Flat affect Overall Cognitive Status: Impaired/Different from baseline Area of Impairment: Orientation;Attention;Following commands;Safety/judgement;Awareness;Problem solving Orientation Level: Disoriented to;Situation (requires min verbal cues for time) Current Attention Level: Sustained Memory: Decreased short-term memory Following Commands: Follows one step commands consistently;Follows one step commands with increased time (cues need to follow commands ) Safety/Judgement: Decreased awareness of deficits;Decreased awareness of safety   Problem Solving: Slow processing;Decreased initiation;Difficulty sequencing;Requires verbal cues;Requires tactile cues General Comments: patient with significant delay in following commands, poor regard for safety, max cues for direction to task    Exercises      General Comments General comments (skin integrity,  edema, etc.): patient c/o dizziness with activity, BP  assessed 110s/60s      Pertinent Vitals/Pain Pain Assessment: 0-10 Pain Score: 8  Pain Location: abdomen Pain Descriptors / Indicators: Discomfort Pain Intervention(s): Monitored during session;Repositioned;Limited activity within patient's tolerance    Home Living                      Prior Function            PT Goals (current goals can now be found in the care plan section) Acute Rehab PT Goals Patient Stated Goal: none stated PT Goal Formulation: With patient Time For Goal Achievement: 11/10/14 Potential to Achieve Goals: Fair Progress towards PT goals: Progressing toward goals    Frequency  Min 2X/week    PT Plan Current plan remains appropriate    Co-evaluation             End of Session Equipment Utilized During Treatment: Gait belt Activity Tolerance: Patient limited by fatigue Patient left: in bed;with call bell/phone within reach     Time: 0901-0919 PT Time Calculation (min) (ACUTE ONLY): 18 min  Charges:  $Therapeutic Activity: 8-22 mins                    G CodesDuncan Dull 2014-11-10, 9:30 AM Alben Deeds, PT DPT  [phone]

## 2014-10-29 NOTE — Progress Notes (Signed)
Called into room by pt complaining of 10/10 chest pain. MD notified. EKG performed and 1 tablet of Hydrocodone given. Will continue to monitor.    Hart Rochester, RN, BSN

## 2014-10-29 NOTE — Progress Notes (Signed)
[name]  [name] VFI:[id number] DOB: 1961-08-07 DOA: 10/12/2014 PCP: Barbette Merino, MD  Brief narrative: 54 year old female patient who was initially admitted to [facility] on 10/12/2014 with sepsis, aspiration pneumonia, acute encephalopathy, and acute kidney injury from obstructive uropathy. Initial hospital course was complicated by C. difficile colitis. She has a known history of HIV positive status but her CD4 count is greater than 1300. She also has history of bipolar disorder, hypertension, COPD and alcohol abuse. While in [place] she developed ventilator-dependent respiratory failure and was transferred to [facility] to ICU under the care of PCCM. Prior to this admission patient had recently been treated for severe esophagitis:  underwent EGD on 12/10 and pathology was positive for Candida etiology. She was treated with IV Diflucan. Initial workup at [facility] included CT of the head and MRI of the brain which was normal, renal ultrasound which was normal, lumbar puncture which was negative for herpes or cryptococcal antigen.   After transfer to [facility] follow-up chest x-ray on 10/25/14 confirmed left lower lobe collapse with consolidation and small effusion; follow-up chest x-ray on 1/6 demonstrated resolution of pulmonary edema and improving left basilar atelectasis and effusion. Her decline was attributed to sepsis, this was associated with hypotension and required pressors. She eventually improved and was extubated. Because of pulmonary edema she was given IV Lasix with effective diuresis. By 10/27/14 she had difficulty with voiding and had to undergo in and out catheterization on several occasions. She has a history of urinary incontinence but no prior history of urinary retention. She was transferred to the stepdown unit on 10/28/14. Of note her renal function has normalized.  HPI/Subjective: Patient alert and primary complaining of  suprapubic pain in setting of acute urinary retention. Patient had what she describes as chest pain overnight with tenderness to palpation of anterior chest wall just above the epigastrium.  Assessment/Plan:  Symptomatic Anemia -Baseline hemoglobin between 10-11 - current hemoglobin 7.8 - obtain anemia panel - transfuse 2 units packed cells in patient with chest pain and persistent tachycardia - follow-up CBC after transfusion   Acute respiratory failure with hypoxia due to aspiration pneumonia -stable on 2 L nasal cannula oxygen - recent pulmonary edema resolved on chest x-ray on 1/8 so will discontinue Lasix - completed antibiotics for pneumonia  Sepsis -Secondary to aspiration pneumonia and C. difficile colitis - continue oral vancomycin - sepsis physiology has resolved except for tachycardia which is likely attributable to symptomatic anemia   Acute Urinary retention / recent pseudomonas urinary tract infection 12/31 -No prior history of urinary retention - patient reports excessive urination prior to admission - large volume retention with at least 1 L obtained with in and out catheterization so Foley reinserted - prior to admission urinary retention could be explained by patient's chronic narcotics/anxiolytics and psychiatric medications - currently off these medications so suspect possible bladder inflammation from concurrent C. difficile colitis could be contributing - renal function is normal - renal ultrasound at admission also normal -1/8: repeat urinalysis with too numerous to count WBCs and concerning for UTI/ culture pending- recent pseudomonas UTI (12/31) was pansensitive except for resistance to imipenem - patient was on Ditropan prior to admission for urinary incontinence  Human immunodeficiency virus (HIV) disease -Followed by ID - continue ART at their discretion - CD4 count greater than 1300 in September  COPD / moderate pulmonary hypertension -Pulmonary hypertension on  echocardiogram this admission - consider adding nitrate  if BP can tolerate - COPD compensated without evidence of wheezing  Essential hypertension -Blood pressure has rebounded with tachycardia so have resumed home Catapres, ACE inhibitor and calcium channel blocker  Grade 1 diastolic dysfunction -Finding on echocardiogram this admission - has preserved LV function - likely contributed to pulmonary edema findings on chest x-ray early during hospitalization  Acute renal failure -Resolved  Acute encephalopathy -Resolved  Clostridium difficile colitis -Currently on oral vancomycin; initiated on 12/27 but was concurrently receiving Zosyn from 12/31 until 1/3 - diarrhea has resolved  Chronic pancreatitis -Currently asymptomatic - did not require pancreatic enzymes/Creon prior to admission  Anxiety -Was on Xanax 1 mg twice a day when necessary at home - held this admission due to acute encephalopathy - currently to remain on hold given acute urinary retention since benzodiazepines can contribute - if blood pressure not improved after resumption of antihypertensive medications and/or tachycardia not resolved/improved after transfusion consider benzodiazepine withdrawal as etiology and may need to consider resuming at lower dosage - preadmission Ambien also on hold  Bipolar 1 disorder -On Risperdal prior to admission - currently on hold   Chronic pain -Prior to admission patient was on Ultram, Valium (for muscle spasm), Robaxin, oxycodone   Candida esophagitis -Has completed course of Diflucan - since having chest pain which is likely related to esophageal pain will get a GI cocktail 1 - has a history of GERD as well but given acute C. difficile colitis would like to avoid PPI so consideration given to either adding H2 blocker and or Carafate - swallow evaluation recommended dysphagia 3 diet   DVT prophylaxis: Lovenox  Code Status:  full Family Communication: no family at  bedside Disposition Plan/Expected LOS: Remain in stepdown   Consultants: PCCM Infectious disease   Procedures:  2-D echocardiogram : - Left ventricle: The cavity size was normal. Systolic function was normal. The estimated ejection fraction was in the range of 60% to 65%. Wall motion was normal; there were no regional wall motion abnormalities. There was an increased relative contribution of atrial contraction to ventricular filling. Doppler parameters are consistent with abnormal left ventricular relaxation (grade 1 diastolic dysfunction). - Aortic valve: Trileaflet; normal thickness, mildly calcified leaflets. - Right ventricle: The cavity size was mildly dilated. Wall thickness was normal. - Right atrium: The atrium was mildly dilated. - Pulmonary arteries: PA peak pressure: 47 mm Hg (S). - Pericardium, extracardiac: A trivial, free-flowing pericardial effusion was identified along the right ventricular free wall and at the apex. The fluid had no internal echoes. There was a left pleural effusion.  Antibiotics: Oral vanc >  Objective: Blood pressure 127/104, pulse 117, temperature 98.5 F (36.9 C), temperature source Oral, resp. rate 12, height 5\' 4"  (1.626 m), weight 143 lb 15.4 oz (65.3 kg), SpO2 99 %.  Intake/Output Summary (Last 24 hours) at 10/29/14 1542 Last data filed at 10/28/14 2229  Gross per 24 hour  Intake      0 ml  Output   1000 ml  Net  -1000 ml   Exam: Gen: No acute respiratory distress-alert Chest: Clear to auscultation bilaterally without wheezes, rhonchi or crackles Cardiac: Regular rate and rhythm, S1-S2, no rubs murmurs or gallops Abdomen: Soft nontender except for fullness and mild tenderness suprapubic area, mildly distended over suprapubic area only, no obvious hepatosplenomegaly, or ascites Extremities: B LE  without cyanosis, clubbing or edema   Scheduled Meds:  Scheduled Meds: . sodium chloride   Intravenous Once  .  antiseptic oral rinse  7 mL Mouth Rinse q12n4p  . cloNIDine  0.1 mg Oral QHS  . darunavir  800 mg Per NG tube Q breakfast  . elvitegravir-cobicistat-emtricitabine-tenofovir  1 tablet Oral Q breakfast  . enoxaparin (LOVENOX) injection  40 mg Subcutaneous Q24H  . feeding supplement (ENSURE)  1 Container Oral TID BM  . lisinopril  20 mg Oral Daily  . nystatin-triamcinolone   Topical BID  . potassium chloride  40 mEq Per Tube BID  . vancomycin  125 mg Per Tube QID  . verapamil  240 mg Oral Daily   Data Reviewed: Basic Metabolic Panel:  Recent Labs Lab 10/24/14 2222 10/25/14 0500 10/25/14 1112 10/26/14 0500 10/26/14 1740 10/27/14 0515 10/28/14 0258 10/29/14 0657  NA  --  143  --  145  --  143 141 136  K  --  4.1  --  4.1  --  4.2 3.8 4.4  CL  --  96  --  104  --  106 103 104  CO2  --  39*  --  33*  --  25 29 25   GLUCOSE  --  117*  --  118*  --  74 92 104*  BUN  --  <5*  --  <5*  --  5* 5* <5*  CREATININE  --  1.06  --  0.92  --  1.02 0.89 0.96  CALCIUM  --  8.2*  --  8.2*  --  8.6 8.5 8.4  MG 2.6* 2.2 1.9 1.6 1.6 1.9 2.1  --   PHOS 2.2* 3.2 3.1 2.7  --   --  3.8  --    Liver Function Tests: No results for input(s): AST, ALT, ALKPHOS, BILITOT, PROT, ALBUMIN in the last 168 hours.  CBC:  Recent Labs Lab 10/23/14 0419 10/24/14 0510 10/25/14 0500 10/26/14 0500 10/27/14 0515 10/28/14 0258  WBC 22.2* 12.3* 9.5 8.6 9.1 7.1  NEUTROABS 16.6* 8.4* 5.4 3.7  --   --   HGB 7.2* 7.1* 7.2* 7.0* 7.2* 7.1*  HCT 21.6* 21.6* 22.6* 22.1* 23.2* 22.4*  MCV 86.7 85.0 85.6 86.7 86.6 85.2  PLT 404* 374 391 374 428* 408*   Cardiac Enzymes:  Recent Labs Lab 10/29/14 0120 10/29/14 0657 10/29/14 1246  TROPONINI <0.03 <0.03 <0.03   CBG:  Recent Labs Lab 10/27/14 1933 10/28/14 0020 10/28/14 0430 10/28/14 0825 10/28/14 1149  GLUCAP 96 101* 122* 102* 98    Recent Results (from the past 240 hour(s))  Culture, Urine     Status: None   Collection Time: 10/21/14  3:17 PM  Result  Value Ref Range Status   Specimen Description URINE, CATHETERIZED  Final   Special Requests NONE  Final   Colony Count   Final    >=100,000 COLONIES/ML Performed at [facility]    Culture   Final    PSEUDOMONAS AERUGINOSA Performed at [facility]    Report Status 10/24/2014 FINAL  Final   Organism ID, Bacteria PSEUDOMONAS AERUGINOSA  Final      Susceptibility   Pseudomonas aeruginosa - MIC*    CEFEPIME 8 SENSITIVE Sensitive     CEFTAZIDIME 16 INTERMEDIATE Intermediate     CIPROFLOXACIN 1 SENSITIVE Sensitive     GENTAMICIN 4 SENSITIVE Sensitive     IMIPENEM >=16 RESISTANT Resistant     PIP/TAZO 32 SENSITIVE Sensitive     TOBRAMYCIN <=1 SENSITIVE Sensitive     * PSEUDOMONAS AERUGINOSA  Culture, blood (routine x 2)     Status: None  Collection Time: 10/21/14  3:45 PM  Result Value Ref Range Status   Specimen Description BLOOD RIGHT ANTECUBITAL  Final   Special Requests BOTTLES DRAWN AEROBIC AND ANAEROBIC 5CC  Final   Culture NO GROWTH 5 DAYS  Final   Report Status 10/26/2014 FINAL  Final  Culture, respiratory (NON-Expectorated)     Status: None   Collection Time: 10/21/14 10:51 PM  Result Value Ref Range Status   Specimen Description TRACHEAL ASPIRATE  Final   Special Requests NONE  Final   Gram Stain   Final    RARE WBC PRESENT, PREDOMINANTLY MONONUCLEAR NO SQUAMOUS EPITHELIAL CELLS SEEN NO ORGANISMS SEEN Performed at [facility]    Culture   Final    Non-Pathogenic Oropharyngeal-type Flora Isolated. Performed at [facility]    Report Status 10/24/2014 FINAL  Final     Studies:  Recent x-ray studies have been reviewed in detail by the Attending Physician  Time spent :  [place], ANP [facility] Office  [phone] Pager [phone]  On-Call/Text Page:      [email or web address]      password [email or web address]  If 7PM-7AM, please contact night-coverage [email or web address] Password [email or web address] 10/29/2014, 3:42 PM   LOS: 17 days    I have personally examined this patient and reviewed the entire database. I have reviewed the above note, made any necessary editorial changes, and agree with its content.  Cherene Altes, MD [facility]

## 2014-10-29 NOTE — Progress Notes (Signed)
[facility] progress note. Chief complaint. Chest pain. History of present illness. This 54 year old female in hospital with acute respiratory failure secondary to aspiration pneumonia. She has no listed history of coronary artery disease or prior MI. She complained to nursing of chest pain. A 12-lead EKG was obtained and I came to the bedside to see the patient and to review the EKG. The EKG looks essentially unchanged from prior EKG without evidence of acute ischemia. Patient described the pain as aching in the epigastric area and under the left breast. She denies diaphoresis but does state she feels nauseous. She denies radiation to left jaw or left arm. Vital signs. Temperature 98.3, pulse 122, respiration 19, blood pressure 145/91. O2 sats 96%. General appearance. Frail middle-aged female who is alert and in no distress. Cardiac. Regular but tachycardic in the low 120s. Pain reproducible with palpation over the epigastric area and under the left breast. Lungs. Breath sounds are clear. Abdomen. Soft and obese with positive bowel sounds. Diffuse pain with palpation. Impression/plan. Problem #1. Chest pain. Pain reproducible with palpation suggesting this is probably not cardiac in origin. Will provide Norco when necessary for pain. Nonetheless we will cycle troponin now and then every 6 hours for a total of 3 sets. We'll recheck an EKG in 6 hours to evaluate for any changes.

## 2014-10-29 NOTE — Progress Notes (Signed)
Pt has not voided for 12 hours after previous in and out cath. Bladder scan was performed and 908 ml was found. MD notified and another in and out cath was ordered. 1000 cc of clear, yellow urine was drained from bladder.    Hart Rochester, RN, BSN

## 2014-10-29 NOTE — Progress Notes (Signed)
2nd unit PRBCs infused Pt tolerated well. B/P 84/61 250 NSS bolus hung as ordered. Brooke from [facility] in to see and evaluate pt

## 2014-10-30 ENCOUNTER — Inpatient Hospital Stay (HOSPITAL_COMMUNITY): Payer: Medicare Other

## 2014-10-30 DIAGNOSIS — A498 Other bacterial infections of unspecified site: Secondary | ICD-10-CM

## 2014-10-30 LAB — CBC
HCT: 31.7 % — ABNORMAL LOW (ref 36.0–46.0)
HEMOGLOBIN: 10.7 g/dL — AB (ref 12.0–15.0)
MCH: 29.3 pg (ref 26.0–34.0)
MCHC: 33.8 g/dL (ref 30.0–36.0)
MCV: 86.8 fL (ref 78.0–100.0)
Platelets: 392 10*3/uL (ref 150–400)
RBC: 3.65 MIL/uL — AB (ref 3.87–5.11)
RDW: 15.6 % — ABNORMAL HIGH (ref 11.5–15.5)
WBC: 11.8 10*3/uL — ABNORMAL HIGH (ref 4.0–10.5)

## 2014-10-30 LAB — COMPREHENSIVE METABOLIC PANEL
ALT: 14 U/L (ref 0–35)
ANION GAP: 13 (ref 5–15)
AST: 25 U/L (ref 0–37)
Albumin: 2.1 g/dL — ABNORMAL LOW (ref 3.5–5.2)
Alkaline Phosphatase: 89 U/L (ref 39–117)
BUN: 7 mg/dL (ref 6–23)
CALCIUM: 8.2 mg/dL — AB (ref 8.4–10.5)
CO2: 25 mmol/L (ref 19–32)
Chloride: 99 mEq/L (ref 96–112)
Creatinine, Ser: 1.25 mg/dL — ABNORMAL HIGH (ref 0.50–1.10)
GFR calc Af Amer: 57 mL/min — ABNORMAL LOW (ref >90)
GFR calc non Af Amer: 49 mL/min — ABNORMAL LOW (ref >90)
Glucose, Bld: 94 mg/dL (ref 70–99)
POTASSIUM: 3.6 mmol/L (ref 3.5–5.1)
SODIUM: 137 mmol/L (ref 135–145)
Total Bilirubin: 0.9 mg/dL (ref 0.3–1.2)
Total Protein: 7.2 g/dL (ref 6.0–8.3)

## 2014-10-30 LAB — TYPE AND SCREEN
ABO/RH(D): A POS
Antibody Screen: NEGATIVE
UNIT DIVISION: 0
Unit division: 0

## 2014-10-30 LAB — FOLATE: FOLATE: 14.2 ng/mL

## 2014-10-30 LAB — VITAMIN B12: Vitamin B-12: 674 pg/mL (ref 211–911)

## 2014-10-30 LAB — HLA B*5701: HLA B 5701: NEGATIVE

## 2014-10-30 LAB — FERRITIN: Ferritin: 573 ng/mL — ABNORMAL HIGH (ref 10–291)

## 2014-10-30 MED ORDER — VANCOMYCIN 50 MG/ML ORAL SOLUTION
125.0000 mg | Freq: Four times a day (QID) | ORAL | Status: DC
  Administered 2014-10-30 (×2): 125 mg via ORAL
  Filled 2014-10-30 (×3): qty 2.5

## 2014-10-30 MED ORDER — SODIUM CHLORIDE 0.9 % IV BOLUS (SEPSIS)
250.0000 mL | Freq: Once | INTRAVENOUS | Status: AC
  Administered 2014-10-30: 250 mL via INTRAVENOUS

## 2014-10-30 MED ORDER — CIPROFLOXACIN IN D5W 400 MG/200ML IV SOLN
400.0000 mg | Freq: Two times a day (BID) | INTRAVENOUS | Status: DC
  Administered 2014-10-30 – 2014-10-31 (×2): 400 mg via INTRAVENOUS
  Filled 2014-10-30 (×5): qty 200

## 2014-10-30 MED ORDER — SODIUM CHLORIDE 0.9 % IV BOLUS (SEPSIS)
1000.0000 mL | Freq: Once | INTRAVENOUS | Status: AC
  Administered 2014-10-30: 1000 mL via INTRAVENOUS

## 2014-10-30 MED ORDER — ALUM & MAG HYDROXIDE-SIMETH 200-200-20 MG/5ML PO SUSP
30.0000 mL | Freq: Once | ORAL | Status: AC
  Administered 2014-10-30: 30 mL via ORAL
  Filled 2014-10-30: qty 30

## 2014-10-30 MED ORDER — SUCRALFATE 1 GM/10ML PO SUSP
1.0000 g | Freq: Three times a day (TID) | ORAL | Status: DC
  Administered 2014-10-30 (×3): 1 g via ORAL
  Filled 2014-10-30 (×5): qty 10

## 2014-10-30 MED ORDER — SODIUM CHLORIDE 0.9 % IV BOLUS (SEPSIS)
500.0000 mL | Freq: Once | INTRAVENOUS | Status: AC
  Administered 2014-10-30: 500 mL via INTRAVENOUS

## 2014-10-30 MED ORDER — SODIUM CHLORIDE 0.9 % IV SOLN
INTRAVENOUS | Status: DC
Start: 2014-10-30 — End: 2014-11-05
  Administered 2014-10-30 – 2014-11-02 (×6): via INTRAVENOUS

## 2014-10-30 MED ORDER — HYDROCORTISONE NA SUCCINATE PF 100 MG IJ SOLR
50.0000 mg | Freq: Three times a day (TID) | INTRAMUSCULAR | Status: DC
  Administered 2014-10-30 – 2014-11-01 (×6): 50 mg via INTRAVENOUS
  Filled 2014-10-30 (×9): qty 1

## 2014-10-30 NOTE — Progress Notes (Signed)
CSW spoke with Ms. Adah Perl regarding discharge planning.  Ms. Adah Perl expressed she has not been able to speak with her daughters about HH vs. SNF and wants to have until Monday morning to do this.  CSW explained that if they decide for SNF, and pt is ready soon for discharge, that it would limit how much time they would have to look into SNF options.  Ms. Adah Perl expressed understanding of this.  CSW will continue to follow to assist with discharge planning.   Apolonio Schneiders (weekend coverage) [phone]

## 2014-10-30 NOTE — Progress Notes (Signed)
Notified Dr. Thereasa Solo of pt's low BP.

## 2014-10-30 NOTE — Progress Notes (Signed)
Kathline Magic NP informed of pt requests to resume her Nexium for her reflux.  Made aware that 45 minutes after taking her potassium 40Meq she vomited 200 cc's bile material. Unsure whether she was able to keep it all down. Zofran 4 mg administered IV for the N/V. No new orders at this time. Will continue to monitor.

## 2014-10-30 NOTE — Progress Notes (Signed)
Notified Dr. Thereasa Solo re: pt's low BP.

## 2014-10-30 NOTE — Progress Notes (Signed)
Notified Dr. Thereasa Solo of pt's c/o "severe acid reflux". Pt vomited approx 625 bile throughout the night.

## 2014-10-30 NOTE — Progress Notes (Signed)
Notified Dr. Thereasa Solo of pt's low BP

## 2014-10-30 NOTE — Progress Notes (Signed)
[name]  [name] MWN:[id number] DOB: 06/10/1961 DOA: 10/12/2014 PCP: Barbette Merino, MD  Brief narrative: 54 year old female patient who was initially admitted to [facility] on 10/12/2014 with sepsis, aspiration pneumonia, acute encephalopathy, and acute kidney injury from obstructive uropathy. Initial hospital course was complicated by C. difficile colitis. She has a known history of HIV positive status but her CD4 count is greater than 1300. She also has history of bipolar disorder, hypertension, COPD and alcohol abuse. While in [place] she developed ventilator-dependent respiratory failure and was transferred to [facility] to ICU under the care of PCCM. Prior to this admission patient had recently been treated for severe esophagitis:  underwent EGD on 12/10 and pathology was positive for Candida etiology. She was treated with IV Diflucan. Initial workup at [facility] included CT of the head and MRI of the brain which was normal, renal ultrasound which was normal, lumbar puncture which was negative for herpes or cryptococcal antigen.   After transfer to [facility] follow-up chest x-ray on 10/25/14 confirmed left lower lobe collapse with consolidation and small effusion; follow-up chest x-ray on 1/6 demonstrated resolution of pulmonary edema and improving left basilar atelectasis and effusion. Her decline was attributed to sepsis, this was associated with hypotension and required pressors. She eventually improved and was extubated. Because of pulmonary edema she was given IV Lasix with effective diuresis. By 10/27/14 she had difficulty with voiding and had to undergo in and out catheterization on several occasions. She has a history of urinary incontinence but no prior history of urinary retention. She was transferred to the stepdown unit on 10/28/14. Of note her renal function has normalized.  HPI/Subjective: Hypotension has proven to be an ongoing  problem.  Despite persisting SBPs in the 80 range, the pt has remained alert and conversant, albeit confused (as has been her baseline since [facility] assumed her care.)  This afternoon her SBP has dropped further, occasionally into the 60 range.  I am bolusing w/ fluids.  Despite this she is able to converse w/ the examiner, and actually tells me she "feels better" today.  Denies cp, sob, or current nausea.  Assessment/Plan:  Persistent hypotension SBP was initially in the 100 range of late, but has drifted downward with the average being a systolic of 40-34 over the last 24hrs - this afternoon her SBP has dropped further - I am bolusing w/ IVF and starting stress dose steroids - despite this, the pt does not currently appear to be in extremis, as she is able to converse, and actually claims she feels better - if her BP does not improve w/ these measures we will have to reconsult PCCM to consider resuming pressor suport    Sepsis Initially secondary to aspiration pneumonia and C. difficile colitis - continue oral vancomycin - now has recurred and appears to be due to recurrent UTI (likely pseudomonas) - volume resuscitate - may have to resume pressor support   Acute Urinary retention / recent multidrug resistant pseudomonas urinary tract infection 12/31 No prior history of urinary retention - patient reports excessive urination prior to admission - large volume retention with at least 1 L obtained with in and out catheterization 1/8 so Foley reinserted - prior to admission urinary retention could be explained by patient's chronic narcotics/anxiolytics and psychiatric medications - renal function is normal - renal ultrasound at admission also normal -1/8 repeat urinalysis with too numerous to count WBCs and concerning for UTI/  culture pending- recent pseudomonas UTI (12/31) resistant to imipenem - patient was on Ditropan prior to admission for urinary incontinence  Symptomatic Anemia Baseline hemoglobin  between 10-11 chronically, but of late has been steady around 7.0 - hemoglobin 7.1 1/8 w/o evidence of acute blood loss but w/ hypotension and tachycardia - anemia panel c/w anemia related to malnutrition (NOT Fe deficient) - transfused 2 units PRBC w/ favorable response   Acute respiratory failure with hypoxia due to aspiration pneumonia stable on 2 L nasal cannula oxygen - recent pulmonary edema resolved on chest x-ray on 1/8 - completed antibiotics for pneumonia  Human immunodeficiency virus (HIV) disease Followed by ID - continue ART at their discretion - CD4 count greater than 1300 in September  COPD / moderate pulmonary hypertension Pulmonary hypertension on echocardiogram this admission - consider adding nitrate if BP can tolerate - COPD compensated without evidence of wheezing  Essential hypertension Blood pressure had rebounded with tachycardia so have resumed home Catapres, ACE inhibitor and calcium channel blocker - now off these meds again due to hypotension   Grade 1 diastolic dysfunction Finding on echocardiogram this admission - has preserved LV function - likely contributed to pulmonary edema findings on chest x-ray early during hospitalization  Acute renal failure Returning w/ episode of urinary retention and hypotension   Acute encephalopathy Resolved  Clostridium difficile colitis Currently on oral vancomycin; initiated on 12/27 but was concurrently receiving Zosyn from 12/31 until 1/3 - diarrhea has resolved  Chronic pancreatitis Currently asymptomatic - did not require pancreatic enzymes/Creon prior to admission  Anxiety Was on Xanax 1 mg twice a day when necessary at home - held this admission due to acute encephalopathy - currently to remain on hold given acute urinary retention and hypotension   Bipolar 1 disorder On Risperdal prior to admission - currently on hold   Chronic pain Prior to admission patient was on Ultram, Valium (for muscle spasm), Robaxin,  oxycodone   Candida esophagitis completed course of Diflucan - has a history of GERD as well but given acute C. difficile colitis would like to avoid PPI so consideration given to either adding H2 blocker and or Carafate - swallow evaluation recommended dysphagia 3 diet   DVT prophylaxis: Lovenox  Code Status:  FULL Family Communication: no family at bedside Disposition Plan/Expected LOS: Remain in stepdown   Consultants: PCCM Infectious disease   Procedures:  2-D echocardiogram : - Left ventricle: The cavity size was normal. Systolic function was normal. The estimated ejection fraction was in the range of 60% to 65%. Wall motion was normal; there were no regional wall motion abnormalities. There was an increased relative contribution of atrial contraction to ventricular filling. Doppler parameters are consistent with abnormal left ventricular relaxation (grade 1 diastolic dysfunction). - Aortic valve: Trileaflet; normal thickness, mildly calcified leaflets. - Right ventricle: The cavity size was mildly dilated. Wall thickness was normal. - Right atrium: The atrium was mildly dilated. - Pulmonary arteries: PA peak pressure: 47 mm Hg (S). - Pericardium, extracardiac: A trivial, free-flowing pericardial effusion was identified along the right ventricular free wall and at the apex. The fluid had no internal echoes. There was a left pleural effusion.  Antibiotics: Oral vanc > Cipro 1/9 >  Objective: Blood pressure 60/31, pulse 109, temperature 98.7 F (37.1 C), temperature source Oral, resp. rate 25, height 5\' 4"  (1.626 m), weight 65.3 kg (143 lb 15.4 oz), SpO2 95 %.  Intake/Output Summary (Last 24 hours) at 10/30/14 1731 Last data filed at 10/30/14  1247  Gross per 24 hour  Intake      0 ml  Output   1250 ml  Net  -1250 ml   Exam: Gen: No acute respiratory distress - answers questions appropriately  Chest: Clear to auscultation bilaterally without  wheezes, rhonchi or crackles Cardiac: tachycardic but regular - no appreciable M   Abdomen: Soft nontender except for fullness and mild tenderness suprapubic area, no obvious hepatosplenomegaly, or ascites Extremities: B LE  without cyanosis, clubbing or edema   Scheduled Meds:  Scheduled Meds: . antiseptic oral rinse  7 mL Mouth Rinse q12n4p  . ciprofloxacin  400 mg Intravenous Q12H  . darunavir  800 mg Per NG tube Q breakfast  . elvitegravir-cobicistat-emtricitabine-tenofovir  1 tablet Oral Q breakfast  . enoxaparin (LOVENOX) injection  40 mg Subcutaneous Q24H  . feeding supplement (ENSURE)  1 Container Oral TID BM  . hydrocortisone sod succinate (SOLU-CORTEF) inj  50 mg Intravenous Q8H  . nystatin-triamcinolone   Topical BID  . potassium chloride  40 mEq Per Tube BID  . sodium chloride  250 mL Intravenous Once  . sucralfate  1 g Oral TID WC & HS  . vancomycin  125 mg Per Tube QID   Data Reviewed: Basic Metabolic Panel:  Recent Labs Lab 10/24/14 2222 10/25/14 0500 10/25/14 1112 10/26/14 0500 10/26/14 1740 10/27/14 0515 10/28/14 0258 10/29/14 0657 10/30/14 0255  NA  --  143  --  145  --  143 141 136 137  K  --  4.1  --  4.1  --  4.2 3.8 4.4 3.6  CL  --  96  --  104  --  106 103 104 99  CO2  --  39*  --  33*  --  25 29 25 25   GLUCOSE  --  117*  --  118*  --  74 92 104* 94  BUN  --  <5*  --  <5*  --  5* 5* <5* 7  CREATININE  --  1.06  --  0.92  --  1.02 0.89 0.96 1.25*  CALCIUM  --  8.2*  --  8.2*  --  8.6 8.5 8.4 8.2*  MG 2.6* 2.2 1.9 1.6 1.6 1.9 2.1  --   --   PHOS 2.2* 3.2 3.1 2.7  --   --  3.8  --   --    Liver Function Tests:  Recent Labs Lab 10/30/14 0255  AST 25  ALT 14  ALKPHOS 89  BILITOT 0.9  PROT 7.2  ALBUMIN 2.1*   CBC:  Recent Labs Lab 10/24/14 0510 10/25/14 0500 10/26/14 0500 10/27/14 0515 10/28/14 0258 10/30/14 0255  WBC 12.3* 9.5 8.6 9.1 7.1 11.8*  NEUTROABS 8.4* 5.4 3.7  --   --   --   HGB 7.1* 7.2* 7.0* 7.2* 7.1* 10.7*  HCT 21.6*  22.6* 22.1* 23.2* 22.4* 31.7*  MCV 85.0 85.6 86.7 86.6 85.2 86.8  PLT 374 391 374 428* 408* 392   Cardiac Enzymes:  Recent Labs Lab 10/29/14 0120 10/29/14 0657 10/29/14 1246  TROPONINI <0.03 <0.03 <0.03   CBG:  Recent Labs Lab 10/27/14 1933 10/28/14 0020 10/28/14 0430 10/28/14 0825 10/28/14 1149  GLUCAP 96 101* 122* 102* 98    Recent Results (from the past 240 hour(s))  Culture, Urine     Status: None   Collection Time: 10/21/14  3:17 PM  Result Value Ref Range Status   Specimen Description URINE, CATHETERIZED  Final   Special Requests NONE  Final   Colony Count   Final    >=100,000 COLONIES/ML Performed at [facility]    Culture   Final    PSEUDOMONAS AERUGINOSA Performed at [facility]    Report Status 10/24/2014 FINAL  Final   Organism ID, Bacteria PSEUDOMONAS AERUGINOSA  Final      Susceptibility   Pseudomonas aeruginosa - MIC*    CEFEPIME 8 SENSITIVE Sensitive     CEFTAZIDIME 16 INTERMEDIATE Intermediate     CIPROFLOXACIN 1 SENSITIVE Sensitive     GENTAMICIN 4 SENSITIVE Sensitive     IMIPENEM >=16 RESISTANT Resistant     PIP/TAZO 32 SENSITIVE Sensitive     TOBRAMYCIN <=1 SENSITIVE Sensitive     * PSEUDOMONAS AERUGINOSA  Culture, blood (routine x 2)     Status: None   Collection Time: 10/21/14  3:45 PM  Result Value Ref Range Status   Specimen Description BLOOD RIGHT ANTECUBITAL  Final   Special Requests BOTTLES DRAWN AEROBIC AND ANAEROBIC 5CC  Final   Culture NO GROWTH 5 DAYS  Final   Report Status 10/26/2014 FINAL  Final  Culture, respiratory (NON-Expectorated)     Status: None   Collection Time: 10/21/14 10:51 PM  Result Value Ref Range Status   Specimen Description TRACHEAL ASPIRATE  Final   Special Requests NONE  Final   Gram Stain   Final    RARE WBC PRESENT, PREDOMINANTLY MONONUCLEAR NO SQUAMOUS EPITHELIAL CELLS SEEN NO ORGANISMS SEEN Performed at [facility]    Culture   Final    Non-Pathogenic  Oropharyngeal-type Flora Isolated. Performed at [facility]    Report Status 10/24/2014 FINAL  Final     Studies:  Recent x-ray studies have been reviewed in detail by the Attending Physician  Time spent :  31 mins   Cherene Altes, MD [facility] For Consults/Admissions - Flow Manager - [phone] Office  [phone]  Contact MD directly via text page:      [email or web address]      password [facility]  10/30/2014, 5:31 PM   LOS: 18 days

## 2014-10-31 DIAGNOSIS — R339 Retention of urine, unspecified: Secondary | ICD-10-CM

## 2014-10-31 DIAGNOSIS — D539 Nutritional anemia, unspecified: Secondary | ICD-10-CM

## 2014-10-31 LAB — COMPREHENSIVE METABOLIC PANEL
ALBUMIN: 1.7 g/dL — AB (ref 3.5–5.2)
ALT: 12 U/L (ref 0–35)
ANION GAP: 7 (ref 5–15)
AST: 19 U/L (ref 0–37)
Alkaline Phosphatase: 78 U/L (ref 39–117)
BUN: 10 mg/dL (ref 6–23)
CALCIUM: 7.5 mg/dL — AB (ref 8.4–10.5)
CO2: 23 mmol/L (ref 19–32)
Chloride: 104 mEq/L (ref 96–112)
Creatinine, Ser: 1.19 mg/dL — ABNORMAL HIGH (ref 0.50–1.10)
GFR calc Af Amer: 60 mL/min — ABNORMAL LOW (ref >90)
GFR calc non Af Amer: 52 mL/min — ABNORMAL LOW (ref >90)
GLUCOSE: 96 mg/dL (ref 70–99)
Potassium: 3.4 mmol/L — ABNORMAL LOW (ref 3.5–5.1)
Sodium: 134 mmol/L — ABNORMAL LOW (ref 135–145)
Total Bilirubin: 0.7 mg/dL (ref 0.3–1.2)
Total Protein: 6.2 g/dL (ref 6.0–8.3)

## 2014-10-31 LAB — CBC
HCT: 27.8 % — ABNORMAL LOW (ref 36.0–46.0)
Hemoglobin: 9 g/dL — ABNORMAL LOW (ref 12.0–15.0)
MCH: 27.8 pg (ref 26.0–34.0)
MCHC: 32.4 g/dL (ref 30.0–36.0)
MCV: 85.8 fL (ref 78.0–100.0)
Platelets: 332 10*3/uL (ref 150–400)
RBC: 3.24 MIL/uL — AB (ref 3.87–5.11)
RDW: 16.7 % — ABNORMAL HIGH (ref 11.5–15.5)
WBC: 10 10*3/uL (ref 4.0–10.5)

## 2014-10-31 LAB — URINE CULTURE: Colony Count: 4000

## 2014-10-31 MED ORDER — VANCOMYCIN 50 MG/ML ORAL SOLUTION
125.0000 mg | Freq: Four times a day (QID) | ORAL | Status: DC
  Administered 2014-10-31 – 2014-11-05 (×21): 125 mg via ORAL
  Filled 2014-10-31 (×24): qty 2.5

## 2014-10-31 MED ORDER — GI COCKTAIL ~~LOC~~
30.0000 mL | Freq: Three times a day (TID) | ORAL | Status: DC | PRN
  Administered 2014-10-31 – 2014-11-02 (×4): 30 mL via ORAL
  Filled 2014-10-31 (×6): qty 30

## 2014-10-31 NOTE — Progress Notes (Signed)
[name]  Airiel Oblinger JIR:[id number] DOB: 11-Sep-1961 DOA: 10/12/2014 PCP: Barbette Merino, MD  Brief narrative: 54 year old female patient who was initially admitted to [facility] on 10/12/2014 with sepsis, aspiration pneumonia, acute encephalopathy, and acute kidney injury from obstructive uropathy. Initial hospital course was complicated by C. difficile colitis. She has a known history of HIV positive status but her CD4 count is greater than 1300. She also has history of bipolar disorder, hypertension, COPD and alcohol abuse. While in [place] she developed ventilator-dependent respiratory failure and was transferred to [facility] to ICU under the care of PCCM. Prior to this admission patient had recently been treated for severe esophagitis:  underwent EGD on 12/10 and pathology was positive for Candida etiology. She was treated with IV Diflucan. Initial workup at [facility] included CT of the head and MRI of the brain which was normal, renal ultrasound which was normal, lumbar puncture which was negative for herpes or cryptococcal antigen.   After transfer to [facility] follow-up chest x-ray on 10/25/14 confirmed left lower lobe collapse with consolidation and small effusion; follow-up chest x-ray on 1/6 demonstrated resolution of pulmonary edema and improving left basilar atelectasis and effusion. Her decline was attributed to sepsis, this was associated with hypotension and required pressors. She eventually improved and was extubated. Because of pulmonary edema she was given IV Lasix with effective diuresis. By 10/27/14 she had difficulty with voiding and had to undergo in and out catheterization on several occasions. She has a history of urinary incontinence but no prior history of urinary retention. She was transferred to the stepdown unit on 10/28/14. Of note her renal function has normalized.  HPI/Subjective: Pt more alert today.  She denies any  specific complaints at this time.  She states she feels "a little better overall."  Assessment/Plan:  Persistent hypotension BP much improved after significant volume expansion yesterday - cont to hold BP meds and follow   Sepsis Initially secondary to aspiration pneumonia and C. difficile colitis - continue oral vancomycin - now has recurred and appears to be due to recurrent UTI (E coli on cx) - volume resuscitated again - stabilizing again - cont Cipro for now   Acute Urinary retention / recent multidrug resistant pseudomonas urinary tract infection 12/31 / E coli UTI large volume retention with at least 1 L 1/8 so Foley reinserted - renal ultrasound at admission normal -1/8 repeat urinalysis c/w  UTI/ culture noting low colony count of E coli - cont Cipro for now and follow   Symptomatic Anemia Baseline hemoglobin between 10-11 chronically, but of late has been steady around 7.0 - hemoglobin 7.1 1/8 w/o evidence of acute blood loss but w/ hypotension and tachycardia - anemia panel c/w anemia related to malnutrition (NOT Fe deficient) - transfused 2 units PRBC w/ favorable response - cont to follow Hgb   Acute respiratory failure with hypoxia due to aspiration pneumonia - resolved stable on 2 L nasal cannula oxygen - recent pulmonary edema resolved on chest x-ray on 1/8 - completed antibiotics for pneumonia  Human immunodeficiency virus (HIV) disease Followed by ID - continue ART at their discretion - CD4 count greater than 1300 in September  COPD / moderate pulmonary hypertension Pulmonary hypertension on echocardiogram this admission - COPD compensated without evidence of wheezing  Essential hypertension off meds due to hypotension   Grade 1 diastolic dysfunction Finding on echocardiogram this admission - has preserved LV function   Acute  renal failure Returning w/ episode of urinary retention and hypotension - appears to be improving again - follow   Acute  encephalopathy Resolved  Clostridium difficile colitis Currently on oral vancomycin; initiated on 12/27 but was concurrently receiving Zosyn from until 1/3 - diarrhea has resolved  Chronic pancreatitis Currently asymptomatic - did not require pancreatic enzymes prior to admission  Anxiety Was on Xanax 1 mg twice a day when necessary at home - held this admission due to acute encephalopathy   Bipolar 1 disorder On Risperdal prior to admission   Chronic pain Prior to admission patient was on Ultram, Valium (for muscle spasm), Robaxin, oxycodone   Candida esophagitis completed course of Diflucan - has a history of GERD as well but given acute C. difficile colitis would like to avoid PPI so added H2 blocker and Carafate - swallow evaluation recommended dysphagia 3 diet   DVT prophylaxis: Lovenox  Code Status:  FULL Family Communication: no family at bedside Disposition Plan/Expected LOS: SDU   Consultants: PCCM Infectious disease   Procedures: TTE 10/22/14 - EF 60-65% - no WMA - grade 1 DD - PA pressure 75mm Hg (mod pulm HTN)  Antibiotics: Oral vanc > Cipro 1/9 >  Objective: Blood pressure 129/99, pulse 110, temperature 98.7 F (37.1 C), temperature source Oral, resp. rate 17, height 5\' 4"  (1.626 m), weight 65.3 kg (143 lb 15.4 oz), SpO2 96 %.  Intake/Output Summary (Last 24 hours) at 10/31/14 1128 Last data filed at 10/31/14 0700  Gross per 24 hour  Intake   1500 ml  Output    300 ml  Net   1200 ml   Exam: Gen: No acute respiratory distress - more alert - conversant  Chest: Clear to auscultation bilaterally without wheezes, rhonchi or crackles Cardiac: tachycardic but regular - no appreciable M   Abdomen: Soft nontender except for fullness and mild tenderness suprapubic area, no obvious hepatosplenomegaly, or ascites Extremities: B LE  without cyanosis, clubbing or edema   Scheduled Meds:  Scheduled Meds: . antiseptic oral rinse  7 mL Mouth Rinse q12n4p  .  ciprofloxacin  400 mg Intravenous Q12H  . darunavir  800 mg Per NG tube Q breakfast  . elvitegravir-cobicistat-emtricitabine-tenofovir  1 tablet Oral Q breakfast  . enoxaparin (LOVENOX) injection  40 mg Subcutaneous Q24H  . feeding supplement (ENSURE)  1 Container Oral TID BM  . hydrocortisone sod succinate (SOLU-CORTEF) inj  50 mg Intravenous Q8H  . nystatin-triamcinolone   Topical BID  . vancomycin  125 mg Oral QID   Data Reviewed: Basic Metabolic Panel:  Recent Labs Lab 10/24/14 2222  10/25/14 0500 10/25/14 1112 10/26/14 0500 10/26/14 1740 10/27/14 0515 10/28/14 0258 10/29/14 0657 10/30/14 0255 10/31/14 0330  NA  --   < > 143  --  145  --  143 141 136 137 134*  K  --   < > 4.1  --  4.1  --  4.2 3.8 4.4 3.6 3.4*  CL  --   < > 96  --  104  --  106 103 104 99 104  CO2  --   < > 39*  --  33*  --  25 29 25 25 23   GLUCOSE  --   < > 117*  --  118*  --  74 92 104* 94 96  BUN  --   < > <5*  --  <5*  --  5* 5* <5* 7 10  CREATININE  --   < > 1.06  --  0.92  --  1.02 0.89 0.96 1.25* 1.19*  CALCIUM  --   < > 8.2*  --  8.2*  --  8.6 8.5 8.4 8.2* 7.5*  MG 2.6*  --  2.2 1.9 1.6 1.6 1.9 2.1  --   --   --   PHOS 2.2*  --  3.2 3.1 2.7  --   --  3.8  --   --   --   < > = values in this interval not displayed.   Liver Function Tests:  Recent Labs Lab 10/30/14 0255 10/31/14 0330  AST 25 19  ALT 14 12  ALKPHOS 89 78  BILITOT 0.9 0.7  PROT 7.2 6.2  ALBUMIN 2.1* 1.7*   CBC:  Recent Labs Lab 10/25/14 0500 10/26/14 0500 10/27/14 0515 10/28/14 0258 10/30/14 0255 10/31/14 0330  WBC 9.5 8.6 9.1 7.1 11.8* 10.0  NEUTROABS 5.4 3.7  --   --   --   --   HGB 7.2* 7.0* 7.2* 7.1* 10.7* 9.0*  HCT 22.6* 22.1* 23.2* 22.4* 31.7* 27.8*  MCV 85.6 86.7 86.6 85.2 86.8 85.8  PLT 391 374 428* 408* 392 332   Cardiac Enzymes:  Recent Labs Lab 10/29/14 0120 10/29/14 0657 10/29/14 1246  TROPONINI <0.03 <0.03 <0.03   CBG:  Recent Labs Lab 10/27/14 1933 10/28/14 0020 10/28/14 0430  10/28/14 0825 10/28/14 1149  GLUCAP 96 101* 122* 102* 98    Recent Results (from the past 240 hour(s))  Culture, Urine     Status: None   Collection Time: 10/21/14  3:17 PM  Result Value Ref Range Status   Specimen Description URINE, CATHETERIZED  Final   Special Requests NONE  Final   Colony Count   Final    >=100,000 COLONIES/ML Performed at [facility]    Culture   Final    PSEUDOMONAS AERUGINOSA Performed at [facility]    Report Status 10/24/2014 FINAL  Final   Organism ID, Bacteria PSEUDOMONAS AERUGINOSA  Final      Susceptibility   Pseudomonas aeruginosa - MIC*    CEFEPIME 8 SENSITIVE Sensitive     CEFTAZIDIME 16 INTERMEDIATE Intermediate     CIPROFLOXACIN 1 SENSITIVE Sensitive     GENTAMICIN 4 SENSITIVE Sensitive     IMIPENEM >=16 RESISTANT Resistant     PIP/TAZO 32 SENSITIVE Sensitive     TOBRAMYCIN <=1 SENSITIVE Sensitive     * PSEUDOMONAS AERUGINOSA  Culture, blood (routine x 2)     Status: None   Collection Time: 10/21/14  3:45 PM  Result Value Ref Range Status   Specimen Description BLOOD RIGHT ANTECUBITAL  Final   Special Requests BOTTLES DRAWN AEROBIC AND ANAEROBIC 5CC  Final   Culture NO GROWTH 5 DAYS  Final   Report Status 10/26/2014 FINAL  Final  Culture, respiratory (NON-Expectorated)     Status: None   Collection Time: 10/21/14 10:51 PM  Result Value Ref Range Status   Specimen Description TRACHEAL ASPIRATE  Final   Special Requests NONE  Final   Gram Stain   Final    RARE WBC PRESENT, PREDOMINANTLY MONONUCLEAR NO SQUAMOUS EPITHELIAL CELLS SEEN NO ORGANISMS SEEN Performed at [facility]    Culture   Final    Non-Pathogenic Oropharyngeal-type Flora Isolated. Performed at [facility]    Report Status 10/24/2014 FINAL  Final  Urine culture     Status: None (Preliminary result)   Collection Time: 10/29/14 11:26 AM  Result Value Ref Range Status  Specimen Description URINE, CATHETERIZED  Final   Special  Requests NONE  Final   Colony Count   Final    4,000 COLONIES/ML Performed at [facility]    Culture   Final    ESCHERICHIA COLI Performed at [facility]    Report Status PENDING  Incomplete     Studies:  Recent x-ray studies have been reviewed in detail by the Attending Physician  Time spent :  36 mins   Cherene Altes, MD [facility] For Consults/Admissions - Flow Manager - [phone] Office  [phone]  Contact MD directly via text page:      [email or web address]      password [facility]  10/31/2014, 11:28 AM   LOS: 19 days

## 2014-10-31 NOTE — Progress Notes (Signed)
10/31/2014 Patient was reassess at 35, she stated pain was a 8. [facility] RN.

## 2014-10-31 NOTE — Progress Notes (Signed)
10/31/2014 Patient was complaining of chest pain in mid chest at 1809. She reported pain around a 8. Stated it was a sharp, she was given Narco and Dr Thereasa Solo was notified, orders were given for GI Cocktail prn.  [facility] RN.

## 2014-11-01 LAB — CBC
HCT: 29 % — ABNORMAL LOW (ref 36.0–46.0)
HEMOGLOBIN: 9.1 g/dL — AB (ref 12.0–15.0)
MCH: 27.3 pg (ref 26.0–34.0)
MCHC: 31.4 g/dL (ref 30.0–36.0)
MCV: 87.1 fL (ref 78.0–100.0)
Platelets: 322 10*3/uL (ref 150–400)
RBC: 3.33 MIL/uL — ABNORMAL LOW (ref 3.87–5.11)
RDW: 17 % — ABNORMAL HIGH (ref 11.5–15.5)
WBC: 7.4 10*3/uL (ref 4.0–10.5)

## 2014-11-01 LAB — COMPREHENSIVE METABOLIC PANEL
ALK PHOS: 63 U/L (ref 39–117)
ALT: 10 U/L (ref 0–35)
ANION GAP: 4 — AB (ref 5–15)
AST: 17 U/L (ref 0–37)
Albumin: 1.4 g/dL — ABNORMAL LOW (ref 3.5–5.2)
BUN: 10 mg/dL (ref 6–23)
CO2: 19 mmol/L (ref 19–32)
Calcium: 6.6 mg/dL — ABNORMAL LOW (ref 8.4–10.5)
Chloride: 115 mEq/L — ABNORMAL HIGH (ref 96–112)
Creatinine, Ser: 0.64 mg/dL (ref 0.50–1.10)
GFR calc non Af Amer: >90 mL/min (ref >90)
Glucose, Bld: 92 mg/dL (ref 70–99)
POTASSIUM: 3.1 mmol/L — AB (ref 3.5–5.1)
SODIUM: 138 mmol/L (ref 135–145)
Total Bilirubin: 0.5 mg/dL (ref 0.3–1.2)
Total Protein: 5 g/dL — ABNORMAL LOW (ref 6.0–8.3)

## 2014-11-01 MED ORDER — POTASSIUM CHLORIDE CRYS ER 20 MEQ PO TBCR
40.0000 meq | EXTENDED_RELEASE_TABLET | Freq: Once | ORAL | Status: AC
  Administered 2014-11-01: 40 meq via ORAL
  Filled 2014-11-01: qty 2

## 2014-11-01 MED ORDER — KETOROLAC TROMETHAMINE 15 MG/ML IJ SOLN
15.0000 mg | Freq: Once | INTRAMUSCULAR | Status: AC
  Administered 2014-11-01: 15 mg via INTRAVENOUS
  Filled 2014-11-01: qty 1

## 2014-11-01 MED ORDER — VERAPAMIL HCL 40 MG PO TABS
40.0000 mg | ORAL_TABLET | Freq: Three times a day (TID) | ORAL | Status: DC
  Administered 2014-11-01 – 2014-11-03 (×6): 40 mg via ORAL
  Filled 2014-11-01 (×9): qty 1

## 2014-11-01 MED ORDER — ONDANSETRON HCL 4 MG PO TABS
4.0000 mg | ORAL_TABLET | Freq: Four times a day (QID) | ORAL | Status: DC | PRN
  Administered 2014-11-01: 4 mg via ORAL
  Filled 2014-11-01: qty 1

## 2014-11-01 MED ORDER — MORPHINE SULFATE 2 MG/ML IJ SOLN
1.0000 mg | Freq: Once | INTRAMUSCULAR | Status: AC
  Administered 2014-11-01: 1 mg via INTRAVENOUS
  Filled 2014-11-01: qty 1

## 2014-11-01 NOTE — Progress Notes (Signed)
[name]  [name] NUU:[id number] DOB: 05/09/1961 DOA: 10/12/2014 PCP: Barbette Merino, MD  Brief narrative: 54 year old female patient who was initially admitted to [facility] on 10/12/2014 with sepsis, aspiration pneumonia, acute encephalopathy, and acute kidney injury from obstructive uropathy. Initial hospital course was complicated by C. difficile colitis. She has a known history of HIV positive status but her CD4 count is greater than 1300. She also has history of bipolar disorder, hypertension, COPD and alcohol abuse. While in [place] she developed ventilator-dependent respiratory failure and was transferred to [facility] to ICU under the care of PCCM. Prior to this admission patient had recently been treated for severe esophagitis:  underwent EGD on 12/10 and pathology was positive for Candida etiology. She was treated with IV Diflucan. Initial workup at [facility] included CT of the head and MRI of the brain which was normal, renal ultrasound which was normal, lumbar puncture which was negative for herpes or cryptococcal antigen.   After transfer to [facility] follow-up chest x-ray on 10/25/14 confirmed left lower lobe collapse with consolidation and small effusion; follow-up chest x-ray on 1/6 demonstrated resolution of pulmonary edema and improving left basilar atelectasis and effusion. Her decline was attributed to sepsis, this was associated with hypotension and required pressors. She eventually improved and was extubated. Because of pulmonary edema she was given IV Lasix with effective diuresis. By 10/27/14 she had difficulty with voiding and had to undergo in and out catheterization on several occasions. She has a history of urinary incontinence but no prior history of urinary retention. She was transferred to the stepdown unit on 10/28/14. Of note her renal function has normalized.  HPI/Subjective: Endorsing severe HA with Nausea - note  BP back up - denies focal neuro sx's or visual disturbances  Assessment/Plan:  Persistent hypotension BP much improved after significant volume expansion and stress dose steriods - now hypertensive again- dc stress dose steroids and cautious reintroduction of BP meds (see below) w/ liberal goal   HTN Will resume Verapamil at 1/2 preadmit dose and use short acting form to prevent hypotension - may be contributing to HA - if persists after BP controlled may need MRI Brain to r/o CVA vs lesions in immunocompromised pt - had MRI 10/13/14  Headache Suspect due to HTN - treat BP - give 1x Toradol (GFR >90 now) and 1x IV MSO4 - if HA persists after BP controlled may need MRI Brain to r/o CVA vs lesions in immunocompromised pt - had MRI 10/13/14   Sepsis Initially secondary to aspiration pneumonia and C. difficile colitis - continue oral vancomycin - recurred and appeared to be due to recurrent UTI (E coli on cx) - volume resuscitated again - stabilizing again - cont Cipro for now - sepsis physiology has resolved  Acute Urinary retention / recent multidrug resistant pseudomonas urinary tract infection 12/31 / E coli UTI large volume retention with at least 1 L 1/8 so Foley reinserted - renal ultrasound at admission normal -1/8 repeat urinalysis c/w  UTI - culture noting low colony count of E coli - cont Cipro and follow   Symptomatic Anemia Baseline hemoglobin between 10-11 chronically, but this admit has been steadily ~ 7.0 - hemoglobin 7.1 1/8 w/o evidence of acute blood loss but w/ hypotension and tachycardia so was transfused PRBCs - anemia panel c/w anemia related to malnutrition (NOT Fe deficient) - as of 1/11 Hgb stable around 9  Acute respiratory failure with  hypoxia due to aspiration pneumonia - resolved stable on 2 L nasal cannula oxygen - recent pulmonary edema resolved on chest x-ray on 1/8 - completed antibiotics for pneumonia  Human immunodeficiency virus (HIV) disease Followed by ID  - continue ART at their discretion - CD4 count greater than 1300 in September  COPD / moderate pulmonary hypertension Pulmonary hypertension on echocardiogram this admission - COPD compensated   Grade 1 diastolic dysfunction Finding on echocardiogram this admission - has preserved LV function   Acute renal failure Returned w/ episode of urinary retention and hypotension - appears to be resolved- avoid ACE I for now  Acute encephalopathy Resolved  Clostridium difficile colitis Currently on oral vancomycin; initiated on 12/27 but was concurrently receiving Zosyn from until 1/3 - diarrhea has resolved  Chronic pancreatitis Currently asymptomatic - did not require pancreatic enzymes prior to admission  Anxiety Was on Xanax 1 mg twice a day when necessary at home - held this admission due to acute encephalopathy   Bipolar 1 disorder On Risperdal prior to admission   Chronic pain Prior to admission patient was on Ultram, Valium (for muscle spasm), Robaxin, oxycodone   Candida esophagitis completed course of Diflucan - has a history of GERD as well but given acute C. difficile colitis would like to avoid PPI so added H2 blocker and Carafate - swallow evaluation recommended dysphagia 3 diet w/ thickened liquids - repeat SLP exam pending  DVT prophylaxis: Lovenox  Code Status:  FULL Family Communication: no family at bedside Disposition Plan/Expected LOS: SDU   Consultants: PCCM Infectious disease   Procedures: TTE 10/22/14 - EF 60-65% - no WMA - grade 1 DD - PA pressure 68mm Hg (mod pulm HTN)  Antibiotics: Oral vanc > Cipro 1/9 >  Objective: Blood pressure 142/99, pulse 103, temperature 97.6 F (36.4 C), temperature source Axillary, resp. rate 16, height 5\' 4"  (1.626 m), weight 143 lb 15.4 oz (65.3 kg), SpO2 99 %.  Intake/Output Summary (Last 24 hours) at 11/01/14 1216 Last data filed at 11/01/14 0900  Gross per 24 hour  Intake    750 ml  Output    600 ml  Net    150 ml    Exam: Gen: No acute respiratory distress - anxious and moaning due to HA-neuro exam nonfocal Chest: Clear to auscultation bilaterally, 2L Cardiac: stable tachycardy which is regular, no R, M, T or G Abdomen: Soft nontender except for fullness and mild tenderness suprapubic area, no obvious hepatosplenomegaly, or ascites Extremities: No cyanosis, clubbing or edema bilaterally  Scheduled Meds:  Scheduled Meds: . antiseptic oral rinse  7 mL Mouth Rinse q12n4p  . darunavir  800 mg Per NG tube Q breakfast  . elvitegravir-cobicistat-emtricitabine-tenofovir  1 tablet Oral Q breakfast  . enoxaparin (LOVENOX) injection  40 mg Subcutaneous Q24H  . feeding supplement (ENSURE)  1 Container Oral TID BM  . ketorolac  15 mg Intravenous Once  .  morphine injection  1 mg Intravenous Once  . nystatin-triamcinolone   Topical BID  . vancomycin  125 mg Oral 4 times per day  . verapamil  40 mg Oral 3 times per day   Data Reviewed: Basic Metabolic Panel:  Recent Labs Lab 10/26/14 0500 10/26/14 1740 10/27/14 0515 10/28/14 0258 10/29/14 0657 10/30/14 0255 10/31/14 0330 11/01/14 0333  NA 145  --  143 141 136 137 134* 138  K 4.1  --  4.2 3.8 4.4 3.6 3.4* 3.1*  CL 104  --  106 103 104 99 104  115*  CO2 33*  --  25 29 25 25 23 19   GLUCOSE 118*  --  74 92 104* 94 96 92  BUN <5*  --  5* 5* <5* 7 10 10   CREATININE 0.92  --  1.02 0.89 0.96 1.25* 1.19* 0.64  CALCIUM 8.2*  --  8.6 8.5 8.4 8.2* 7.5* 6.6*  MG 1.6 1.6 1.9 2.1  --   --   --   --   PHOS 2.7  --   --  3.8  --   --   --   --      Liver Function Tests:  Recent Labs Lab 10/30/14 0255 10/31/14 0330 11/01/14 0333  AST 25 19 17   ALT 14 12 10   ALKPHOS 89 78 63  BILITOT 0.9 0.7 0.5  PROT 7.2 6.2 5.0*  ALBUMIN 2.1* 1.7* 1.4*   CBC:  Recent Labs Lab 10/26/14 0500 10/27/14 0515 10/28/14 0258 10/30/14 0255 10/31/14 0330 11/01/14 0333  WBC 8.6 9.1 7.1 11.8* 10.0 7.4  NEUTROABS 3.7  --   --   --   --   --   HGB 7.0* 7.2* 7.1*  10.7* 9.0* 9.1*  HCT 22.1* 23.2* 22.4* 31.7* 27.8* 29.0*  MCV 86.7 86.6 85.2 86.8 85.8 87.1  PLT 374 428* 408* 392 332 322   Cardiac Enzymes:  Recent Labs Lab 10/29/14 0120 10/29/14 0657 10/29/14 1246  TROPONINI <0.03 <0.03 <0.03   CBG:  Recent Labs Lab 10/27/14 1933 10/28/14 0020 10/28/14 0430 10/28/14 0825 10/28/14 1149  GLUCAP 96 101* 122* 102* 98    Recent Results (from the past 240 hour(s))  Urine culture     Status: None   Collection Time: 10/29/14 11:26 AM  Result Value Ref Range Status   Specimen Description URINE, CATHETERIZED  Final   Special Requests NONE  Final   Colony Count   Final    4,000 COLONIES/ML Performed at [facility]    Culture   Final    ESCHERICHIA COLI Performed at [facility]    Report Status 10/31/2014 FINAL  Final   Organism ID, Bacteria ESCHERICHIA COLI  Final      Susceptibility   Escherichia coli - MIC*    AMPICILLIN >=32 RESISTANT Resistant     CEFAZOLIN <=4 SENSITIVE Sensitive     CEFTRIAXONE <=1 SENSITIVE Sensitive     CIPROFLOXACIN >=4 RESISTANT Resistant     GENTAMICIN <=1 SENSITIVE Sensitive     LEVOFLOXACIN >=8 RESISTANT Resistant     NITROFURANTOIN <=16 SENSITIVE Sensitive     TOBRAMYCIN <=1 SENSITIVE Sensitive     TRIMETH/SULFA >=320 RESISTANT Resistant     PIP/TAZO <=4 SENSITIVE Sensitive     * ESCHERICHIA COLI     Studies:  Recent x-ray studies have been reviewed in detail by the Attending Physician  Time spent :  35 mins   Erin Hearing, ANP [facility] For Consults/Admissions - Flow Manager - [phone] Office  [phone]  Contact MD directly via text page:      [email or web address]      password [email or web address]  11/01/2014, 12:16 PM   LOS: 20 days   I have personally examined this patient and reviewed the entire database. I have reviewed the above note, made any necessary editorial changes, and agree with its content.  Cherene Altes, MD [facility]

## 2014-11-01 NOTE — Progress Notes (Signed)
SLP Cancellation Note  Patient Details Name: [name] MRN: [id number] DOB: 07-30-1961   Cancelled treatment:        Attempted to observe for possible liquid upgrade. Pt stating head hurts, did not want to be repositioned for thin liquid trials. ST will attempt return this afternoon if schedule allows.    Houston Siren 11/01/2014, 9:14 AM Orbie Pyo Colvin Caroli.Ed [employer] [phone]

## 2014-11-01 NOTE — Progress Notes (Signed)
Explained PICC procedure, benefits and risks to patient.  Pt agreeable to procedure and signed consent but then said she didn't want the PICC placed today.   Will attempt tomorrow  Staff RN aware

## 2014-11-02 LAB — BASIC METABOLIC PANEL
Anion gap: 7 (ref 5–15)
BUN: 14 mg/dL (ref 6–23)
CO2: 22 mmol/L (ref 19–32)
CREATININE: 0.72 mg/dL (ref 0.50–1.10)
Calcium: 7.4 mg/dL — ABNORMAL LOW (ref 8.4–10.5)
Chloride: 109 mEq/L (ref 96–112)
GFR calc Af Amer: >90 mL/min (ref >90)
Glucose, Bld: 88 mg/dL (ref 70–99)
Potassium: 3.3 mmol/L — ABNORMAL LOW (ref 3.5–5.1)
Sodium: 138 mmol/L (ref 135–145)

## 2014-11-02 LAB — GLUCOSE, CAPILLARY: GLUCOSE-CAPILLARY: 81 mg/dL (ref 70–99)

## 2014-11-02 MED ORDER — SODIUM CHLORIDE 0.9 % IJ SOLN
10.0000 mL | Freq: Two times a day (BID) | INTRAMUSCULAR | Status: DC
  Administered 2014-11-02: 20 mL

## 2014-11-02 MED ORDER — RISPERIDONE 1 MG PO TABS
1.0000 mg | ORAL_TABLET | Freq: Two times a day (BID) | ORAL | Status: DC
  Administered 2014-11-02 – 2014-11-05 (×7): 1 mg via ORAL
  Filled 2014-11-02 (×9): qty 1

## 2014-11-02 MED ORDER — ALPRAZOLAM 0.5 MG PO TABS: 1.0000 mg | ORAL_TABLET | Freq: Two times a day (BID) | ORAL | Status: DC | PRN

## 2014-11-02 MED ORDER — LORAZEPAM 2 MG/ML IJ SOLN
0.5000 mg | Freq: Once | INTRAMUSCULAR | Status: AC
  Administered 2014-11-02: 0.5 mg via INTRAVENOUS
  Filled 2014-11-02: qty 1

## 2014-11-02 MED ORDER — POTASSIUM CHLORIDE CRYS ER 20 MEQ PO TBCR
40.0000 meq | EXTENDED_RELEASE_TABLET | Freq: Once | ORAL | Status: AC
  Administered 2014-11-02: 40 meq via ORAL
  Filled 2014-11-02: qty 2

## 2014-11-02 MED ORDER — ALPRAZOLAM 0.5 MG PO TABS
0.5000 mg | ORAL_TABLET | Freq: Two times a day (BID) | ORAL | Status: DC | PRN
  Administered 2014-11-03 – 2014-11-05 (×3): 0.5 mg via ORAL
  Filled 2014-11-02 (×3): qty 1

## 2014-11-02 MED ORDER — SODIUM CHLORIDE 0.9 % IJ SOLN
10.0000 mL | INTRAMUSCULAR | Status: DC | PRN
  Administered 2014-11-04: 10 mL
  Filled 2014-11-02: qty 40

## 2014-11-02 MED ORDER — SUCRALFATE 1 GM/10ML PO SUSP
1.0000 g | Freq: Three times a day (TID) | ORAL | Status: DC
  Administered 2014-11-02 – 2014-11-05 (×12): 1 g via ORAL
  Filled 2014-11-02 (×20): qty 10

## 2014-11-02 NOTE — Clinical Social Work Note (Signed)
CSW contacted patients sister, Mrs. Adah Perl, who stated that she is no longer going to be making the decision for the patient- provided CSW with name of patients niece who will be making the decision.    Patients niece is agreeable to placement in [place].  CSW will continue to follow.  Domenica Reamer, [place] Social Worker [phone]

## 2014-11-02 NOTE — Progress Notes (Signed)
Peripherally Inserted Central Catheter/Midline Placement  The IV Nurse has discussed with the patient and/or persons authorized to consent for the patient, the purpose of this procedure and the potential benefits and risks involved with this procedure.  The benefits include less needle sticks, lab draws from the catheter and patient may be discharged home with the catheter.  Risks include, but not limited to, infection, bleeding, blood clot (thrombus formation), and puncture of an artery; nerve damage and irregular heat beat.  Alternatives to this procedure were also discussed.  PICC/Midline Placement Documentation        [name] 11/02/2014, 12:24 PM Consent was obtained from patient on 11/01/14 by Jacobo Forest, RN, CRNI

## 2014-11-02 NOTE — Progress Notes (Signed)
  Pt admitted to the unit. Pt is stable, alert and oriented per baseline. Oriented to room, staff, and call bell. Educated to call for any assistance. Bed in lowest position, call bell within reach- will continue to monitor. 

## 2014-11-02 NOTE — Progress Notes (Signed)
Physical Therapy Treatment Patient Details Name: [name] MRN: [id number] DOB: July 06, 1961 Today's Date: 11/02/2014    History of Present Illness This 54 y.o. female admitted to [facility] 12/22 with sepsis, aspiration PNA, acute encephalopathy, AKI from obstructive uropathy.  CT and MRI of brain with no acute abnormalities; MRI of thoracic and lumbar spine showed mild degenerative disease, mild spondylosis L4-5; L5-S1; Pt found to be + for C-diff.  She was transferred to [facility] 12/31 with VDRF likely due to pulmonary edema.  Pt + for encephalopathy.  PMH includes: HIV +; Bipolar disorder    PT Comments    Patient continues to demonstrates significant deficits in functional mobility as indicated below. Patient with alternating emotional outbursts during session. Tolerated OOB to chair but required increased assist for OOB mobility. Current goals remain appropriate. OF NOTE: VS assessed throughout session, BP 120s/100 HR elevated 120s with activity.   Follow Up Recommendations  SNF     Equipment Recommendations  None recommended by PT    Recommendations for Other Services       Precautions / Restrictions Precautions Precautions: Fall Precaution Comments: C diff precautions    Mobility  Bed Mobility Overal bed mobility: Needs Assistance Bed Mobility: Supine to Sit     Supine to sit: +2 for physical assistance;Max assist     General bed mobility comments: assist for LEs and trunk with bed pad  Transfers Overall transfer level: Needs assistance Equipment used: 2 person hand held assist Transfers: Sit to/from Stand;Stand Pivot Transfers Sit to Stand: +2 physical assistance;Max assist Stand pivot transfers: +2 physical assistance;Max assist       General transfer comment: assist to rise, transfer weight over feet, and for stability  Ambulation/Gait                 Stairs            Wheelchair Mobility    Modified Rankin (Stroke Patients Only)        Balance Overall balance assessment: Needs assistance Sitting-balance support: Feet supported Sitting balance-Leahy Scale: Poor Sitting balance - Comments: no LOB, close supervision    Standing balance support: Bilateral upper extremity supported Standing balance-Leahy Scale: Poor Standing balance comment: performed standing during pericare >3 minutes with use of RW, heavy reliance on RW and assist for stability,                     Cognition Arousal/Alertness: Lethargic Behavior During Therapy: Flat affect Overall Cognitive Status: Impaired/Different from baseline Area of Impairment: Orientation;Attention;Following commands;Safety/judgement;Awareness;Problem solving Orientation Level: Disoriented to;Situation Current Attention Level: Sustained Memory: Decreased short-term memory Following Commands: Follows one step commands with increased time;Follows one step commands consistently (with multimodal cues) Safety/Judgement: Decreased awareness of deficits;Decreased awareness of safety   Problem Solving: Slow processing;Decreased initiation;Difficulty sequencing;Requires verbal cues;Requires tactile cues General Comments: patient with significant delay in following commands, poor regard for safety, max cues for direction to task    Exercises      General Comments        Pertinent Vitals/Pain Pain Assessment: Faces Faces Pain Scale: Hurts little more Pain Location: R arm Pain Descriptors / Indicators: Sore Pain Intervention(s): Limited activity within patient's tolerance    Home Living                      Prior Function            PT Goals (current goals can now be found in the care plan section)  Acute Rehab PT Goals Patient Stated Goal: none stated PT Goal Formulation: With patient Time For Goal Achievement: 11/10/14 Potential to Achieve Goals: Fair Progress towards PT goals: Progressing toward goals    Frequency  Min 2X/week    PT Plan Current  plan remains appropriate    Co-evaluation PT/OT/SLP Co-Evaluation/Treatment: Yes Reason for Co-Treatment: Necessary to address cognition/behavior during functional activity PT goals addressed during session: Mobility/safety with mobility       End of Session Equipment Utilized During Treatment: Gait belt Activity Tolerance: Patient limited by fatigue Patient left: in chair;with call bell/phone within reach;with chair alarm set     Time: 0821-0853 PT Time Calculation (min) (ACUTE ONLY): 32 min  Charges:  $Therapeutic Activity: 8-22 mins                    G CodesDuncan Dull 19-Nov-2014, 1:17 PM Alben Deeds, [place] DPT  [phone]

## 2014-11-02 NOTE — Progress Notes (Signed)
Occupational Therapy Treatment Patient Details Name: [name] MRN: [id number] DOB: 1961-05-13 Today's Date: 11/02/2014    History of present illness This 54 y.o. female admitted to [facility] 12/22 with sepsis, aspiration PNA, acute encephalopathy, AKI from obstructive uropathy.  CT and MRI of brain with no acute abnormalities; MRI of thoracic and lumbar spine showed mild degenerative disease, mild spondylosis L4-5; L5-S1; Pt found to be + for C-diff.  She was transferred to [facility] 12/31 with VDRF likely due to pulmonary edema.  Pt + for encephalopathy.  PMH includes: HIV +; Bipolar disorder   OT comments  Pt asleep upon therapists entry. Required encouragement for OOB to chair and +2 physical assist.  Pt with lethargy and wimpering but without specific complaints.  BP 126/103 with bed mobility and transfer to chair, RN made aware.  Pt requiring assist for grooming, eating and pericare.  SNF continues to be the optimal d/c plan.  Follow Up Recommendations  SNF;Supervision/Assistance - 24 hour    Equipment Recommendations       Recommendations for Other Services      Precautions / Restrictions Precautions Precautions: Fall Precaution Comments: C diff precautions       Mobility Bed Mobility Overal bed mobility: Needs Assistance Bed Mobility: Supine to Sit     Supine to sit: +2 for physical assistance;Max assist     General bed mobility comments: assist for LEs and trunk with bed pad  Transfers Overall transfer level: Needs assistance Equipment used: 2 person hand held assist Transfers: Sit to/from Stand;Stand Pivot Transfers Sit to Stand: +2 physical assistance;Max assist Stand pivot transfers: +2 physical assistance;Max assist       General transfer comment: assist to rise, transfer weight over feet, and for stability    Balance   Sitting-balance support: Feet supported Sitting balance-Leahy Scale: Poor Sitting balance - Comments: no LOB, close supervision       Standing balance-Leahy Scale: Poor                     ADL Overall ADL's : Needs assistance/impaired Eating/Feeding: Moderate assistance;Sitting   Grooming: Wash/dry face;Sitting;Minimal assistance               Lower Body Dressing: Total assistance;Sit to/from stand   Toilet Transfer: +2 for physical assistance;Maximal assistance;Stand-pivot Toilet Transfer Details (indicate cue type and reason): simulated to chair with 2 hands held [place] and Hygiene: Total assistance;Sit to/from stand       Functional mobility during ADLs: +2 for physical assistance;Maximal assistance General ADL Comments: Pt requiring max encouragment to participate.      Vision                     Perception     Praxis      Cognition   Behavior During Therapy: Flat affect Overall Cognitive Status: Impaired/Different from baseline Area of Impairment: Orientation;Attention;Following commands;Safety/judgement;Awareness;Problem solving Orientation Level: Disoriented to;Situation Current Attention Level: Sustained Memory: Decreased short-term memory  Following Commands: Follows one step commands with increased time;Follows one step commands consistently (with multimodal cues) Safety/Judgement: Decreased awareness of deficits;Decreased awareness of safety   Problem Solving: Slow processing;Decreased initiation;Difficulty sequencing;Requires verbal cues;Requires tactile cues General Comments: patient with significant delay in following commands, poor regard for safety, max cues for direction to task    Extremity/Trunk Assessment               Exercises     Shoulder Instructions  General Comments      Pertinent Vitals/ Pain       Pain Assessment: Faces Faces Pain Scale: Hurts little more Pain Location: R arm Pain Descriptors / Indicators: Sore Pain Intervention(s): Limited activity within patient's tolerance;Monitored during  session;Repositioned  Home Living                                          Prior Functioning/Environment              Frequency Min 2X/week     Progress Toward Goals  OT Goals(current goals can now be found in the care plan section)  Progress towards OT goals: Not progressing toward goals - comment (lethargy, impaired cognition)  Acute Rehab OT Goals Patient Stated Goal: none stated  Plan Discharge plan remains appropriate    Co-evaluation    PT/OT/SLP Co-Evaluation/Treatment: Yes Reason for Co-Treatment: Necessary to address cognition/behavior during functional activity;For patient/therapist safety   OT goals addressed during session: ADL's and self-care      End of Session     Activity Tolerance Patient limited by fatigue;Patient limited by lethargy   Patient Left in chair;with call bell/phone within reach;with chair alarm set   Nurse Communication  (aware of pt's BP)        Time: 1683-7290 OT Time Calculation (min): 32 min  Charges: OT General Charges $OT Visit: 1 Procedure OT Treatments $Self Care/Home Management : 8-22 mins  Malka So 11/02/2014, 9:14 AM  [phone]

## 2014-11-02 NOTE — Progress Notes (Signed)
Gave report to the Alinda Sierras, RN on [place] is alert and oriented, vital signs are stable.   Alessandra Grout, RN

## 2014-11-02 NOTE — Progress Notes (Signed)
[name]  [name] KJZ:[id number] DOB: 28-Mar-1961 DOA: 10/12/2014 PCP: Barbette Merino, MD  Brief narrative: 54 year old female patient who was initially admitted to [facility] on 10/12/2014 with sepsis, aspiration pneumonia, acute encephalopathy, and acute kidney injury from obstructive uropathy. Initial hospital course was complicated by C. difficile colitis. She has a known history of HIV positive status but her CD4 count is greater than 1300. She also has history of bipolar disorder, hypertension, COPD and alcohol abuse. While in [place] she developed ventilator-dependent respiratory failure and was transferred to [facility] to ICU under the care of PCCM. Prior to this admission patient had recently been treated for severe esophagitis:  underwent EGD on 12/10 and pathology was positive for Candida etiology. She was treated with IV Diflucan. Initial workup at [facility] included CT of the head and MRI of the brain which was normal, renal ultrasound which was normal, lumbar puncture which was negative for herpes or cryptococcal antigen.   After transfer to [facility] follow-up chest x-ray on 10/25/14 confirmed left lower lobe collapse with consolidation and small effusion; follow-up chest x-ray on 1/6 demonstrated resolution of pulmonary edema and improving left basilar atelectasis and effusion. Her decline was attributed to sepsis, this was associated with hypotension and required pressors. She eventually improved and was extubated. Because of pulmonary edema she was given IV Lasix with effective diuresis. By 10/27/14 she had difficulty with voiding and had to undergo in and out catheterization on several occasions with resultant reinsertion of Foley catheter. She has a history of urinary incontinence but no prior history of urinary retention. She was transferred to the stepdown unit on 10/28/14. Of note her renal function has normalized.  Since  assuming care of this patient from critical care service as noted Foley catheter has required reinsertion, follow-up urine culture positive for Escherichia coli. She is also had issues with recurrent hypotension presumed related to sepsis from UTI in setting of resumption of home antihypertensive agents. Sepsis physiology has resolved and she has once again become hypertension so we have been slowly adding back antihypertensive agents one at a time. She's also been having issues related to tachycardia that could be related to withdrawal of clonidine or more likely related to her underlying anxiety and psych issues and possible benzodiazepine withdrawal. On 1/12 patient's Risperdal was resumed along with one half of her usual Xanax dose. Also 1/12 and bladder training program has been initiated with plans on day 4 to be able to discontinue the Foley.  HPI/Subjective: Headache resolved. Primarily complaining of recurrent issues with pain in throat and reflux symptoms. Is able to swallow some pills whole.  Assessment/Plan:  Persistent hypotension BP much improved after significant volume expansion and stress dose steriods - now hypertensive again- dc'd stress dose steroids 1/11 and cautiously reintroduced BP meds (see below) w/ liberal goal   HTN Resumed Verapamil at half preadmit dose on 1/11 and used short acting form to prevent hypotension - current BP controlled at this dose  Headache Suspect was due to HTN ; given 1x Toradol (GFR >90 now) and 1x IV MSO4-BP now controlled and headache resolved  Sepsis Sepsis physiology has resolved -Initially secondary to aspiration pneumonia and C. difficile colitis; continue oral vancomycin - by 1/9 recurred and appeared to be due to recurrent UTI (E coli on cx) and required volume resuscitation with resolution of hypotension  Acute Urinary retention / recent multidrug resistant pseudomonas urinary tract infection 12/31 /  E coli UTI large volume retention with  at least 1 L 1/8 so Foley reinserted - renal ultrasound at admission normal -1/8 repeat urinalysis c/w  UTI - culture noting low colony count of E coli - cont Cipro and follow -on 7/12 had begun cyclic clamping and unclamping Foley for bladder training with plans by day 4 to be able to remove Foley  Symptomatic Anemia Baseline hemoglobin between 10-11 chronically, but this admit has been steadily ~ 7.0 - hemoglobin 7.1 1/8 w/o evidence of acute blood loss but w/ hypotension and tachycardia so was transfused PRBCs - anemia panel c/w anemia related to malnutrition (NOT Fe deficient) - Hgb remains stable around 9  Acute respiratory failure with hypoxia due to aspiration pneumonia - resolved stable on 2 L nasal cannula oxygen - recent pulmonary edema resolved on chest x-ray on 1/8 - completed antibiotics for pneumonia  Human immunodeficiency virus (HIV) disease Followed by ID - continue ART at their discretion - CD4 count greater than 1300 in September  COPD / moderate pulmonary hypertension Pulmonary hypertension on echocardiogram this admission - COPD compensated   Grade 1 diastolic dysfunction Finding on echocardiogram this admission - has preserved LV function   Acute renal failure Returned w/ episode of urinary retention and hypotension - appears to be resolved- avoid ACE I for now  Acute encephalopathy Resolved  Clostridium difficile colitis Currently on oral vancomycin; initiated on 12/27 but was concurrently receiving Zosyn from until 1/3 - diarrhea has resolved  Chronic pancreatitis Currently asymptomatic - did not require pancreatic enzymes prior to admission  Anxiety Was on Xanax 1 mg twice a day when necessary at home - held this admission due to acute encephalopathy -on 1/12 decided to resume but at 0.5 mg twice a day  Bipolar 1 disorder On Risperdal prior to admission-resumed on 1/12  Sinus tachycardia Spent multifactorial related to ongoing GI pain as well as anxiety  and need to resume psychotropic medications   Chronic pain Prior to admission patient was on Ultram, Valium (for muscle spasm), Robaxin, oxycodone   Candida esophagitis completed course of Diflucan - has a history of GERD as well but given acute C. difficile colitis would like to avoid PPI so initially tried when necessary GI cocktail with limited success so on 1/12 added Carafate - swallow evaluation recommended dysphagia 3 diet w/ thickened liquids - repeat SLP exam pending  DVT prophylaxis: Lovenox  Code Status:  FULL Family Communication: no family at bedside Disposition Plan/Expected LOS: Transfer to floor-PT/OT recommends skilled nursing facility at [place] notify social worker  Consultants: PCCM Infectious disease   Procedures: TTE 10/22/14 - EF 60-65% - no WMA - grade 1 DD - PA pressure 69mm Hg (mod pulm HTN)  Antibiotics: Oral vanc > Cipro 1/9 >  Objective: Blood pressure 122/78, pulse 121, temperature 99.5 F (37.5 C), temperature source Axillary, resp. rate 22, height 5\' 4"  (1.626 m), weight 143 lb 15.4 oz (65.3 kg), SpO2 99 %.  Intake/Output Summary (Last 24 hours) at 11/02/14 1328 Last data filed at 11/02/14 0700  Gross per 24 hour  Intake      0 ml  Output    350 ml  Net   -350 ml   Exam: Gen: No acute respiratory distress  Chest: Clear to auscultation bilaterally without wheeze or crackles, 2L Cardiac: stable tachycardy which is regular, out R, M, T or G, or JVD, or peripheral edema Abdomen: Soft nontender, bowel sounds present, obvious hepatosplenomegaly or ascites Genitourinary: Foley catheter with  clear dark urine Extremities: No cyanosis, or clubbing bilaterally  Scheduled Meds:  Scheduled Meds: . antiseptic oral rinse  7 mL Mouth Rinse q12n4p  . darunavir  800 mg Per NG tube Q breakfast  . elvitegravir-cobicistat-emtricitabine-tenofovir  1 tablet Oral Q breakfast  . enoxaparin (LOVENOX) injection  40 mg Subcutaneous Q24H  . feeding supplement  (ENSURE)  1 Container Oral TID BM  . nystatin-triamcinolone   Topical BID  . risperiDONE  1 mg Oral BID  . sodium chloride  10-40 mL Intracatheter Q12H  . sucralfate  1 g Oral TID WC & HS  . vancomycin  125 mg Oral 4 times per day  . verapamil  40 mg Oral 3 times per day   Data Reviewed: Basic Metabolic Panel:  Recent Labs Lab 10/26/14 1740  10/27/14 0515 10/28/14 0258 10/29/14 1245 10/30/14 0255 10/31/14 0330 11/01/14 0333 11/02/14 0416  NA  --   < > 143 141 136 137 134* 138 138  K  --   < > 4.2 3.8 4.4 3.6 3.4* 3.1* 3.3*  CL  --   < > 106 103 104 99 104 115* 109  CO2  --   < > 25 29 25 25 23 19 22   GLUCOSE  --   < > 74 92 104* 94 96 92 88  BUN  --   < > 5* 5* <5* 7 10 10 14   CREATININE  --   < > 1.02 0.89 0.96 1.25* 1.19* 0.64 0.72  CALCIUM  --   < > 8.6 8.5 8.4 8.2* 7.5* 6.6* 7.4*  MG 1.6  --  1.9 2.1  --   --   --   --   --   PHOS  --   --   --  3.8  --   --   --   --   --   < > = values in this interval not displayed.   Liver Function Tests:  Recent Labs Lab 10/30/14 0255 10/31/14 0330 11/01/14 0333  AST 25 19 17   ALT 14 12 10   ALKPHOS 89 78 63  BILITOT 0.9 0.7 0.5  PROT 7.2 6.2 5.0*  ALBUMIN 2.1* 1.7* 1.4*   CBC:  Recent Labs Lab 10/27/14 0515 10/28/14 0258 10/30/14 0255 10/31/14 0330 11/01/14 0333  WBC 9.1 7.1 11.8* 10.0 7.4  HGB 7.2* 7.1* 10.7* 9.0* 9.1*  HCT 23.2* 22.4* 31.7* 27.8* 29.0*  MCV 86.6 85.2 86.8 85.8 87.1  PLT 428* 408* 392 332 322   Cardiac Enzymes:  Recent Labs Lab 10/29/14 0120 10/29/14 0657 10/29/14 1246  TROPONINI <0.03 <0.03 <0.03   CBG:  Recent Labs Lab 10/27/14 1933 10/28/14 0020 10/28/14 0430 10/28/14 0825 10/28/14 1149  GLUCAP 96 101* 122* 102* 98    Recent Results (from the past 240 hour(s))  Urine culture     Status: None   Collection Time: 10/29/14 11:26 AM  Result Value Ref Range Status   Specimen Description URINE, CATHETERIZED  Final   Special Requests NONE  Final   Colony Count   Final     4,000 COLONIES/ML Performed at [facility]    Culture   Final    ESCHERICHIA COLI Performed at [facility]    Report Status 10/31/2014 FINAL  Final   Organism ID, Bacteria ESCHERICHIA COLI  Final      Susceptibility   Escherichia coli - MIC*    AMPICILLIN >=32 RESISTANT Resistant     CEFAZOLIN <=4 SENSITIVE Sensitive  CEFTRIAXONE <=1 SENSITIVE Sensitive     CIPROFLOXACIN >=4 RESISTANT Resistant     GENTAMICIN <=1 SENSITIVE Sensitive     LEVOFLOXACIN >=8 RESISTANT Resistant     NITROFURANTOIN <=16 SENSITIVE Sensitive     TOBRAMYCIN <=1 SENSITIVE Sensitive     TRIMETH/SULFA >=320 RESISTANT Resistant     PIP/TAZO <=4 SENSITIVE Sensitive     * ESCHERICHIA COLI     Studies:  Recent x-ray studies have been reviewed in detail by the Attending Physician  Time spent :  35 mins   Erin Hearing, ANP [facility] For Consults/Admissions - Flow Manager - [phone] Office  [phone]  Contact MD directly via text page:      [email or web address]      password [facility]  11/02/2014, 1:28 PM   LOS: 21 days

## 2014-11-02 NOTE — Progress Notes (Signed)
Speech Language Pathology Treatment: Dysphagia  Patient Details Name: Demika Langenderfer MRN: [id number] DOB: [birth year] Today's Date: 11/02/2014 Time: 2035-5974 SLP Time Calculation (min) (ACUTE ONLY): 10 min  Assessment / Plan / Recommendation Clinical Impression  Pt seen for potential upgrade to thin liquids. Two trials thin via cup resulted in mildly delayed cough which appeared related to liquids with sensation. Pt exhibits periods of decreased cooperation and refused to take additional sips to determine upgrade at bedside versus need for repeat MBS (although likely with continued decreased tracheal protection and possible adequate sensation). SLP explained clinical reasoning of observation with thin liquids. She stated she needed to use the bathroom and became upset when asked if urination or defalcation, stating "it doesn't matter which one." (pt has foley catheter). Recommend continue Dys 3 texture and nectar thick liquids and continued  ST.   HPI HPI: EGD on 09/30/14 due to N/V and difficulty swallowing. 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 She was sent to ER on 10/12/14 with vomiting blood and acute encephalopathy. She was also in acute renal failure with hyperkalemia and metabolic acidosis. She was started on Abx for sepsis and possible aspiration pneumonia and eventually started on diflucan for esophageal candidiasis. She developed diarrhea and was found to be positive for C diff. She continued to improve, and was transferred out of ICU on 10/19/14. She had speech therapy swallow evaluation which was normal. She developed progressive dyspnea on 10/21/14. She was treated with BiPAP and lasix, and transferred back to ICU. Her respiratory status did not improve, and she was intubated. She was then transferred to [facility] for further treatment. Pt was extubated 1/5.   Pertinent Vitals Pain Assessment:  No/denies pain Faces Pain Scale: Hurts little more Pain Location: R arm Pain Descriptors / Indicators: Sore Pain Intervention(s): Limited activity within patient's tolerance;Monitored during session;Repositioned  SLP Plan  Continue with current plan of care    Recommendations Diet recommendations: Dysphagia 3 (mechanical soft);Nectar-thick liquid Liquids provided via: Cup;No straw Medication Administration: Whole meds with puree Supervision: Patient able to self feed;Intermittent supervision to cue for compensatory strategies Compensations: Slow rate;Small sips/bites Postural Changes and/or Swallow Maneuvers: Seated upright 90 degrees;Upright 30-60 min after meal              Oral Care Recommendations: Oral care BID Follow up Recommendations:  (TBD) Plan: Continue with current plan of care    GO     Houston Siren 11/02/2014, 10:26 AM   Orbie Pyo Colvin Caroli.Ed [employer] [phone]

## 2014-11-03 ENCOUNTER — Inpatient Hospital Stay (HOSPITAL_COMMUNITY): Payer: Medicare Other

## 2014-11-03 LAB — CBC
HCT: 27.9 % — ABNORMAL LOW (ref 36.0–46.0)
Hemoglobin: 8.8 g/dL — ABNORMAL LOW (ref 12.0–15.0)
MCH: 28 pg (ref 26.0–34.0)
MCHC: 31.5 g/dL (ref 30.0–36.0)
MCV: 88.9 fL (ref 78.0–100.0)
Platelets: 337 10*3/uL (ref 150–400)
RBC: 3.14 MIL/uL — ABNORMAL LOW (ref 3.87–5.11)
RDW: 17.4 % — AB (ref 11.5–15.5)
WBC: 13.1 10*3/uL — ABNORMAL HIGH (ref 4.0–10.5)

## 2014-11-03 LAB — GLUCOSE, CAPILLARY: Glucose-Capillary: 133 mg/dL — ABNORMAL HIGH (ref 70–99)

## 2014-11-03 LAB — BASIC METABOLIC PANEL
ANION GAP: 4 — AB (ref 5–15)
BUN: 8 mg/dL (ref 6–23)
CHLORIDE: 106 meq/L (ref 96–112)
CO2: 22 mmol/L (ref 19–32)
CREATININE: 0.67 mg/dL (ref 0.50–1.10)
Calcium: 7.3 mg/dL — ABNORMAL LOW (ref 8.4–10.5)
GFR calc non Af Amer: >90 mL/min (ref >90)
Glucose, Bld: 103 mg/dL — ABNORMAL HIGH (ref 70–99)
Potassium: 3.4 mmol/L — ABNORMAL LOW (ref 3.5–5.1)
SODIUM: 132 mmol/L — AB (ref 135–145)

## 2014-11-03 MED ORDER — SODIUM CHLORIDE 0.9 % IV BOLUS (SEPSIS)
500.0000 mL | Freq: Once | INTRAVENOUS | Status: AC
  Administered 2014-11-03: 500 mL via INTRAVENOUS

## 2014-11-03 MED ORDER — SODIUM CHLORIDE 0.9 % IV SOLN
INTRAVENOUS | Status: DC
  Administered 2014-11-03: 23:00:00 via INTRAVENOUS
  Administered 2014-11-03: 1000 mL via INTRAVENOUS
  Administered 2014-11-04 (×2): via INTRAVENOUS
  Administered 2014-11-05: 1000 mL via INTRAVENOUS

## 2014-11-03 MED ORDER — SODIUM CHLORIDE 0.9 % IV BOLUS (SEPSIS)
250.0000 mL | Freq: Once | INTRAVENOUS | Status: AC
  Administered 2014-11-03: 250 mL via INTRAVENOUS

## 2014-11-03 NOTE — Progress Notes (Signed)
NUTRITION FOLLOW UP  Intervention:   -Continue with Ensure Pudding po TID, each supplement provides 170 kcal and 4 grams of protein -Continue with Magic Cup TID  Nutrition Dx:   Inadequate oral intake now related to poor appetite and dysphagia as evidenced by < 50% meal completion, ongoing.  Goal:   Intake to meet >90% of estimated nutrition needs. Unmet.  Monitor:   PO intake, labs, weight trend.  Assessment:   Pt being given a bath at time of visit Pt continues to have poor intake (noted 0-20%). However, she is taking supplements well. She is receiving Magic Cups and Ensure Pudding, both TID.  SLP continues to follow. She remains on thickened liquids at this time, due to minimal participatin in trial of thin liquids on 11/02/14. Discharge disposition is to SNF once medically stable. CSW following.  Labs reviewed. Na: 132, K: 3.4, Calcium: 7.8, Glucose: 103. CBGS:81-133.  Height: Ht Readings from Last 1 Encounters:  10/20/14 5\' 4"  (1.626 m)    Weight Status:   Wt Readings from Last 1 Encounters:  10/27/14 143 lb 15.4 oz (65.3 kg)   10/21/14 168 lb 6.9 oz (76.4 kg)   12/22/15145 lb (66.1 kg)  Re-estimated needs:  Kcal: 1800-2000 Protein: 78-88 grams Fluid: 1.8-2.0 L  Skin: Intact  Diet Order: DIET DYS 3 with nectar thick liquids   Intake/Output Summary (Last 24 hours) at 11/03/14 1352 Last data filed at 11/03/14 1301  Gross per 24 hour  Intake    950 ml  Output    225 ml  Net    725 ml    Last BM: 11/03/14   Labs:   Recent Labs Lab 10/28/14 0258  11/01/14 0333 11/02/14 0416 11/03/14 0609  NA 141  < > 138 138 132*  K 3.8  < > 3.1* 3.3* 3.4*  CL 103  < > 115* 109 106  CO2 29  < > 19 22 22   BUN 5*  < > 10 14 8   CREATININE 0.89  < > 0.64 0.72 0.67  CALCIUM 8.5  < > 6.6* 7.4* 7.3*  MG 2.1  --   --   --   --   PHOS 3.8  --   --   --   --   GLUCOSE 92  < > 92 88 103*  < > = values in this interval not displayed.  CBG (last 3)   Recent  Labs  11/02/14 1335 11/03/14 1119  GLUCAP 81 133*    Scheduled Meds: . antiseptic oral rinse  7 mL Mouth Rinse q12n4p  . darunavir  800 mg Per NG tube Q breakfast  . elvitegravir-cobicistat-emtricitabine-tenofovir  1 tablet Oral Q breakfast  . enoxaparin (LOVENOX) injection  40 mg Subcutaneous Q24H  . feeding supplement (ENSURE)  1 Container Oral TID BM  . nystatin-triamcinolone   Topical BID  . risperiDONE  1 mg Oral BID  . sodium chloride  10-40 mL Intracatheter Q12H  . sucralfate  1 g Oral TID WC & HS  . vancomycin  125 mg Oral 4 times per day  . verapamil  40 mg Oral 3 times per day    Continuous Infusions: . sodium chloride 20 mL/hr at 11/02/14 1641  . sodium chloride 1,000 mL (11/03/14 1320)    Jermal Dismuke A. Jimmye Norman, RD, LDN, CDE Pager: [phone] After hours Pager: [phone]

## 2014-11-03 NOTE — Progress Notes (Addendum)
[facility] Progress Note   Kynnadi Dicenso FTD:[id number] DOB: 24-Nov-1960 DOA: 10/12/2014 PCP: Barbette Merino, MD  Brief narrative: [name] is a 54 y.o. female who was initially admitted to [facility] on 10/12/2014 with sepsis, aspiration pneumonia, acute encephalopathy, and acute kidney injury from obstructive uropathy. Initial hospital course was complicated by C. difficile colitis. She has a known history of HIV positive status but her CD4 count is greater than 1300. She also has history of bipolar disorder, hypertension, COPD and alcohol abuse. While in [place] she developed ventilator-dependent respiratory failure and was transferred to [facility] to ICU under the care of PCCM. Prior to this admission patient had recently been treated for severe esophagitis: underwent EGD on 12/10 and pathology was positive for Candida etiology. She was treated with IV Diflucan. Initial workup at [facility] included CT of the head and MRI of the brain which was normal, renal ultrasound which was normal, lumbar puncture which was negative for herpes or cryptococcal antigen.   After transfer to [facility] follow-up chest x-ray on 10/25/14 confirmed left lower lobe collapse with consolidation and small effusion; follow-up chest x-ray on 1/6 demonstrated resolution of pulmonary edema and improving left basilar atelectasis and effusion. Her decline was attributed to sepsis, this was associated with hypotension and required pressors. She eventually improved and was extubated. Because of pulmonary edema she was given IV Lasix with effective diuresis. By 10/27/14 she had difficulty with voiding and had to undergo in and out catheterization on several occasions with resultant reinsertion of Foley catheter. She has a history of urinary incontinence but no prior history of urinary retention. She was transferred to the stepdown unit on 10/28/14. Of note her renal function has normalized.  Since assuming care  of this patient from critical care service as noted Foley catheter has required reinsertion, follow-up urine culture positive for Escherichia coli. She is also had issues with recurrent hypotension presumed related to sepsis from UTI in setting of resumption of home antihypertensive agents. Sepsis physiology has resolved and she has once again become hypertension so we have been slowly adding back antihypertensive agents one at a time. She's also been having issues related to tachycardia that could be related to withdrawal of clonidine or more likely related to her underlying anxiety and psych issues and possible benzodiazepine withdrawal. On 1/12 patient's Risperdal was resumed along with one half of her usual Xanax dose. Also 1/12 and bladder training program has been initiated with plans on day 4 to be able to discontinue the Foley.    Subjective: C/o abdominal pain- foley not draining - found to have almost 800 cc of urine in the bladder- have changed foley and now draining well.   Assessment/Plan: Persistent hypotension BP dropping again today- given fluid boluses- hold antihypertensives.  HTN D/c antihypertensives Clostridium difficile colitis Currently on oral vancomycin; initiated on 12/27 but was concurrently receiving Zosyn from until 1/3 - diarrhea has resolved Sepsis Fever recurring- low grade today- follow closely- WBC count elevated as well- may be related to urinary retention? Acute Urinary retention / recent multidrug resistant pseudomonas urinary tract infection 12/31 / E coli UTI large volume retention with at least 1 L 1/8 so Foley reinserted - renal ultrasound at admission normal -1/8 repeat urinalysis c/w UTI - culture noting low colony count of E coli - follow sensitivities. Symptomatic Anemia Baseline hemoglobin between 10-11 chronically, but this admit has been steadily ~ 7.0 - hemoglobin 7.1 1/8 w/o evidence of acute blood loss but  w/ hypotension and tachycardia so was  transfused PRBCs - anemia panel c/w anemia related to malnutrition (NOT Fe deficient) - Hgb remains stable around 9 Acute respiratory failure with hypoxia due to aspiration pneumonia - resolved stable on 2 L nasal cannula oxygen - recent pulmonary edema resolved on chest x-ray on 1/8 - completed antibiotics for pneumonia Human immunodeficiency virus (HIV) disease Followed by ID - continue ART at their discretion - CD4 count greater than 1300 in September COPD / moderate pulmonary hypertension Pulmonary hypertension on echocardiogram this admission - COPD compensated  Grade 1 diastolic dysfunction Finding on echocardiogram this admission - has preserved LV function  Acute renal failure Returned w/ episode of urinary retention and hypotension - appears to be resolved- avoid ACE I for now Acute encephalopathy Resolved Chronic pancreatitis Currently asymptomatic - did not require pancreatic enzymes prior to admission Anxiety Was on Xanax 1 mg twice a day when necessary at home - held this admission due to acute encephalopathy -on 1/12 decided to resume but at 0.5 mg twice a day Bipolar 1 disorder On Risperdal prior to admission-resumed on 1/12 Sinus tachycardia Spent multifactorial related to ongoing GI pain as well as anxiety and need to resume psychotropic medications  Chronic pain Prior to admission patient was on Ultram, Valium (for muscle spasm), Robaxin, oxycodone  Candida esophagitis completed course of Diflucan - has a history of GERD as well but given acute C. difficile colitis would like to avoid PPI so initially tried when necessary GI cocktail with limited success so on 1/12 added Carafate - swallow evaluation recommended dysphagia 3 diet w/ thickened liquids -    Code Status: Full code Family Communication:  Disposition Plan: home  DVT prophylaxis: lovenox  Consultants: PCCM ID  Procedures: TTE 10/22/14 - EF 60-65% - no WMA - grade 1 DD - PA pressure 29mm Hg (mod pulm  HTN)  Antibiotics: Anti-infectives    Start     Dose/Rate Route Frequency Ordered Stop   10/31/14 1200  vancomycin (VANCOCIN) 50 mg/mL oral solution 125 mg     125 mg Oral 4 times per day 10/31/14 1147     10/30/14 1800  ciprofloxacin (CIPRO) IVPB 400 mg  Status:  Discontinued    Comments:  Pharmacy may adjust dose prn   400 mg200 mL/hr over 60 Minutes Intravenous Every 12 hours 10/30/14 1638 10/31/14 1731   10/30/14 1800  vancomycin (VANCOCIN) 50 mg/mL oral solution 125 mg  Status:  Discontinued     125 mg Oral 4 times daily 10/30/14 1737 10/31/14 1145   10/26/14 0800  elvitegravir-cobicistat-emtricitabine-tenofovir (GENVOYA) 150-150-200-10 MG tablet 1 tablet    Comments:  May give PER NG TUBE, MAY CRUSH thought not completely bioequivalent to GENVOYA po   1 tablet Oral Daily with breakfast 10/25/14 1501     10/26/14 0800  Darunavir Ethanolate (PREZISTA) tablet 800 mg     800 mg Per NG tube Daily with breakfast 10/25/14 1501     10/22/14 1000  fluconazole (DIFLUCAN) IVPB 200 mg  Status:  Discontinued     200 mg100 mL/hr over 60 Minutes Intravenous Every 24 hours 10/21/14 2231 10/27/14 1325   10/21/14 2300  piperacillin-tazobactam (ZOSYN) IVPB 3.375 g  Status:  Discontinued     3.375 g12.5 mL/hr over 240 Minutes Intravenous 3 times per day 10/21/14 2254 10/25/14 0843   10/21/14 2218  vancomycin (VANCOCIN) 50 mg/mL oral solution 125 mg  Status:  Discontinued     125 mg Per Tube 4 times daily  10/21/14 2215 10/30/14 1737   10/21/14 1000  fluconazole (DIFLUCAN) tablet 200 mg  Status:  Discontinued     200 mg Oral Daily 10/20/14 1610 10/21/14 2215   10/17/14 1800  vancomycin (VANCOCIN) 50 mg/mL oral solution 125 mg  Status:  Discontinued     125 mg Oral 4 times daily 10/17/14 1736 10/21/14 2215   10/17/14 1400  clindamycin (CLEOCIN) capsule 300 mg  Status:  Discontinued     300 mg Oral 3 times per day 10/17/14 1022 10/17/14 1130   10/17/14 1200  fluconazole (DIFLUCAN) tablet 100 mg  Status:   Discontinued     100 mg Oral Daily 10/17/14 1152 10/20/14 1610   10/16/14 0800  ritonavir (NORVIR) tablet 100 mg  Status:  Discontinued     100 mg Oral Daily with breakfast 10/15/14 1237 10/21/14 2215   10/16/14 0800  Darunavir Ethanolate (PREZISTA) tablet 800 mg  Status:  Discontinued     800 mg Oral Daily with breakfast 10/15/14 1237 10/21/14 2215   10/15/14 1245  raltegravir (ISENTRESS) tablet 400 mg  Status:  Discontinued     400 mg Oral 2 times daily 10/15/14 1237 10/21/14 2215   10/15/14 1245  emtricitabine-tenofovir (TRUVADA) 200-300 MG per tablet 1 tablet  Status:  Discontinued     1 tablet Oral Daily 10/15/14 1237 10/21/14 2215   10/14/14 1400  doripenem (DORIBAX) 500 mg in sodium chloride 0.9 % 100 mL IVPB  Status:  Discontinued     500 mg100 mL/hr over 60 Minutes Intravenous 3 times per day 10/14/14 1040 10/17/14 1022   10/14/14 0600  vancomycin (VANCOCIN) IVPB 750 mg/150 ml premix  Status:  Discontinued     750 mg150 mL/hr over 60 Minutes Intravenous Every 12 hours 10/13/14 1822 10/17/14 1022   10/13/14 2200  piperacillin-tazobactam (ZOSYN) IVPB 3.375 g  Status:  Discontinued     3.375 g12.5 mL/hr over 240 Minutes Intravenous Every 8 hours 10/13/14 1822 10/14/14 1039   10/13/14 1900  vancomycin (VANCOCIN) 1,250 mg in sodium chloride 0.9 % 250 mL IVPB     1,250 mg166.7 mL/hr over 90 Minutes Intravenous  Once 10/13/14 1822 10/13/14 2109   10/13/14 1230  cefTRIAXone (ROCEPHIN) 1 g in dextrose 5 % 50 mL IVPB - Premix  Status:  Discontinued     1 g100 mL/hr over 30 Minutes Intravenous Every 24 hours 10/13/14 1113 10/14/14 0820         Objective: Filed Weights   10/25/14 0500 10/26/14 0458 10/27/14 0630  Weight: 69.3 kg (152 lb 12.5 oz) 69.8 kg (153 lb 14.1 oz) 65.3 kg (143 lb 15.4 oz)    Intake/Output Summary (Last 24 hours) at 11/03/14 1625 Last data filed at 11/03/14 1500  Gross per 24 hour  Intake    870 ml  Output   1925 ml  Net  -1055 ml     Vitals Filed Vitals:    11/03/14 1211 11/03/14 1322 11/03/14 1457 11/03/14 1618  BP: 88/62 109/78 99/68 115/72  Pulse:   128   Temp: 99.7 F (37.6 C) 98.8 F (37.1 C) 99 F (37.2 C) 98.7 F (37.1 C)  TempSrc: Oral Oral Oral Axillary  Resp: 24 48 36 24  Height:      Weight:      SpO2: 95% 92% 99% 98%    Exam: General: AA- confused- No acute respiratory distress Lungs: Clear to auscultation bilaterally without wheezes or crackles Cardiovascular: Regular rate and rhythm without murmur gallop or rub normal S1  and S2 Abdomen: moderate diffuse tenderness, + distended, soft, bowel sounds positive, no rebound, no ascites, no appreciable mass Extremities: No significant cyanosis, clubbing, or edema bilateral lower extremities  Data Reviewed: Basic Metabolic Panel:  Recent Labs Lab 10/28/14 0258  10/30/14 0255 10/31/14 0330 11/01/14 0333 11/02/14 0416 11/03/14 0609  NA 141  < > 137 134* 138 138 132*  K 3.8  < > 3.6 3.4* 3.1* 3.3* 3.4*  CL 103  < > 99 104 115* 109 106  CO2 29  < > 25 23 19 22 22   GLUCOSE 92  < > 94 96 92 88 103*  BUN 5*  < > 7 10 10 14 8   CREATININE 0.89  < > 1.25* 1.19* 0.64 0.72 0.67  CALCIUM 8.5  < > 8.2* 7.5* 6.6* 7.4* 7.3*  MG 2.1  --   --   --   --   --   --   PHOS 3.8  --   --   --   --   --   --   < > = values in this interval not displayed. Liver Function Tests:  Recent Labs Lab 10/30/14 0255 10/31/14 0330 11/01/14 0333  AST 25 19 17   ALT 14 12 10   ALKPHOS 89 78 63  BILITOT 0.9 0.7 0.5  PROT 7.2 6.2 5.0*  ALBUMIN 2.1* 1.7* 1.4*   No results for input(s): LIPASE, AMYLASE in the last 168 hours. No results for input(s): AMMONIA in the last 168 hours. CBC:  Recent Labs Lab 10/28/14 0258 10/30/14 0255 10/31/14 0330 11/01/14 0333 11/03/14 0609  WBC 7.1 11.8* 10.0 7.4 13.1*  HGB 7.1* 10.7* 9.0* 9.1* 8.8*  HCT 22.4* 31.7* 27.8* 29.0* 27.9*  MCV 85.2 86.8 85.8 87.1 88.9  PLT 408* 392 332 322 337   Cardiac Enzymes:  Recent Labs Lab 10/29/14 0120  10/29/14 0657 10/29/14 1246  TROPONINI <0.03 <0.03 <0.03   BNP (last 3 results) No results for input(s): PROBNP in the last 8760 hours. CBG:  Recent Labs Lab 10/28/14 0430 10/28/14 0825 10/28/14 1149 11/02/14 1335 11/03/14 1119  GLUCAP 122* 102* 98 81 133*    Recent Results (from the past 240 hour(s))  Urine culture     Status: None   Collection Time: 10/29/14 11:26 AM  Result Value Ref Range Status   Specimen Description URINE, CATHETERIZED  Final   Special Requests NONE  Final   Colony Count   Final    4,000 COLONIES/ML Performed at [facility]    Culture   Final    ESCHERICHIA COLI Performed at [facility]    Report Status 10/31/2014 FINAL  Final   Organism ID, Bacteria ESCHERICHIA COLI  Final      Susceptibility   Escherichia coli - MIC*    AMPICILLIN >=32 RESISTANT Resistant     CEFAZOLIN <=4 SENSITIVE Sensitive     CEFTRIAXONE <=1 SENSITIVE Sensitive     CIPROFLOXACIN >=4 RESISTANT Resistant     GENTAMICIN <=1 SENSITIVE Sensitive     LEVOFLOXACIN >=8 RESISTANT Resistant     NITROFURANTOIN <=16 SENSITIVE Sensitive     TOBRAMYCIN <=1 SENSITIVE Sensitive     TRIMETH/SULFA >=320 RESISTANT Resistant     PIP/TAZO <=4 SENSITIVE Sensitive     * ESCHERICHIA COLI     Studies:  Recent x-ray studies have been reviewed in detail by the Attending Physician  Scheduled Meds:  Scheduled Meds: . antiseptic oral rinse  7 mL Mouth Rinse q12n4p  . darunavir  800 mg Per NG tube Q breakfast  . elvitegravir-cobicistat-emtricitabine-tenofovir  1 tablet Oral Q breakfast  . enoxaparin (LOVENOX) injection  40 mg Subcutaneous Q24H  . feeding supplement (ENSURE)  1 Container Oral TID BM  . nystatin-triamcinolone   Topical BID  . risperiDONE  1 mg Oral BID  . sodium chloride  10-40 mL Intracatheter Q12H  . sucralfate  1 g Oral TID WC & HS  . vancomycin  125 mg Oral 4 times per day  . verapamil  40 mg Oral 3 times per day   Continuous Infusions: . sodium  chloride 20 mL/hr at 11/02/14 1641  . sodium chloride 1,000 mL (11/03/14 1320)    Time spent on care of this patient: 13 min   Salome, MD 11/03/2014, 4:25 PM  LOS: 22 days   [facility] Office  [phone] Pager - Text Page per [email or web address]  If 7PM-7AM, please contact night-coverage [email or web address]

## 2014-11-03 NOTE — Progress Notes (Signed)
Pt has had multiple incontinent loose stools throughout the night. Pt buttocks is turning slightly red. Rectal tube placed. Pt tolerated well. Will continue to monitor.

## 2014-11-03 NOTE — Progress Notes (Signed)
Patient with poor urine output. At bladder scan - 929 ml urine. Per MD recomandation, foley was changed. The new catheter patent, draining dark urine. Will continue to monitor.

## 2014-11-03 NOTE — Progress Notes (Signed)
MD was notified about patient's status. New order - NS 514ml bolus was given and vital signs will be monitored closely.

## 2014-11-03 NOTE — Clinical Social Work Placement (Addendum)
[place] WORK PLACEMENT NOTE 11/03/2014  Patient:  [name], [name]  Account Number:  [id number] [place] date:  10/12/2014  Clinical Social Worker:  Domenica Reamer, CLINICAL SOCIAL WORKER  Date/time:  11/03/2014 08:45 AM  Clinical Social Work is seeking post-discharge placement for this patient at the following level of care:   [place]   (*CSW will update this form in Epic as items are completed)   11/02/2014  Patient/family provided with [place] Department of Clinical Social Work's list of facilities offering this level of care within the geographic area requested by the patient (or if unable, by the patient's family).  11/02/2014  Patient/family informed of their freedom to choose among providers that offer the needed level of care, that participate in Medicare, Medicaid or managed care program needed by the patient, have an available bed and are willing to accept the patient.  11/02/2014  Patient/family informed of [facility]' ownership interest in [facility], as well as of the fact that they are under no obligation to receive care at this facility.  PASARR submitted to EDS on 11/02/2014 PASARR number received on   FL2 transmitted to all facilities in geographic area requested by pt/family on  11/02/2014 FL2 transmitted to all facilities within larger geographic area on   Patient informed that his/her managed care company has contracts with or will negotiate with  certain facilities, including the following:     Patient/family informed of bed offers received:  11/04/2014 Patient chooses bed at [facility] Physician recommends and patient chooses bed at    Patient to be transferred to [facility] on  11/05/2014   Patient to be transferred to facility by  [facility] Patient and family notified of transfer on  11/05/2014 Name of family member notified:  Aldona Bar  The following physician request were  entered in Epic:   Additional Comments:

## 2014-11-03 NOTE — Progress Notes (Addendum)
Pt is having bright red blood in her sputum. Forrest Moron, NP notified. Orders written. Will pass on to Day shift RN.

## 2014-11-03 NOTE — Progress Notes (Signed)
On rounds with SDU transfer follow up patient found to be confused - seemingly on-going and intermittent - restless - tachycardic - 135 regular - tachypnic RR 28 - BP 84/58 O2 sats 100% on RA CBG 134.  Awake and alert but confused -agitated -  oriented to person and place only  - skin cool and dry - oral mucosa dry - oral care done - rectal temp 100 F - has rectal tube present - dark liquid stool present.  Very tender to touch LLQ - foley present - little dk amber urine - I/O from yesterday reveals only 125 cc urine output for 24 hours.  Lungs clear.   BP trending down during the pm hours.  Ardeen Fillers RN to call MD with update.  Will follow.  Patient has PICC line.  WBC trending up.

## 2014-11-04 LAB — BASIC METABOLIC PANEL
ANION GAP: 4 — AB (ref 5–15)
BUN: 12 mg/dL (ref 6–23)
CO2: 21 mmol/L (ref 19–32)
Calcium: 7.5 mg/dL — ABNORMAL LOW (ref 8.4–10.5)
Chloride: 118 mEq/L — ABNORMAL HIGH (ref 96–112)
Creatinine, Ser: 0.59 mg/dL (ref 0.50–1.10)
GFR calc Af Amer: >90 mL/min (ref >90)
GFR calc non Af Amer: >90 mL/min (ref >90)
Glucose, Bld: 95 mg/dL (ref 70–99)
Potassium: 2.8 mmol/L — ABNORMAL LOW (ref 3.5–5.1)
Sodium: 143 mmol/L (ref 135–145)

## 2014-11-04 LAB — CBC
HEMATOCRIT: 23.2 % — AB (ref 36.0–46.0)
Hemoglobin: 7.5 g/dL — ABNORMAL LOW (ref 12.0–15.0)
MCH: 28.5 pg (ref 26.0–34.0)
MCHC: 32.3 g/dL (ref 30.0–36.0)
MCV: 88.2 fL (ref 78.0–100.0)
Platelets: 317 10*3/uL (ref 150–400)
RBC: 2.63 MIL/uL — ABNORMAL LOW (ref 3.87–5.11)
RDW: 17.3 % — AB (ref 11.5–15.5)
WBC: 13.9 10*3/uL — ABNORMAL HIGH (ref 4.0–10.5)

## 2014-11-04 LAB — HEMOGLOBIN AND HEMATOCRIT, BLOOD
HCT: 25.9 % — ABNORMAL LOW (ref 36.0–46.0)
Hemoglobin: 8.5 g/dL — ABNORMAL LOW (ref 12.0–15.0)

## 2014-11-04 LAB — PREPARE RBC (CROSSMATCH)

## 2014-11-04 MED ORDER — DIPHENOXYLATE-ATROPINE 2.5-0.025 MG PO TABS
1.0000 | ORAL_TABLET | Freq: Four times a day (QID) | ORAL | Status: DC
  Administered 2014-11-04 – 2014-11-05 (×5): 1 via ORAL
  Filled 2014-11-04 (×5): qty 1

## 2014-11-04 MED ORDER — SODIUM CHLORIDE 0.9 % IV SOLN
Freq: Once | INTRAVENOUS | Status: AC
  Administered 2014-11-04: 13:00:00 via INTRAVENOUS

## 2014-11-04 MED ORDER — CEFAZOLIN SODIUM 1-5 GM-% IV SOLN
1.0000 g | Freq: Three times a day (TID) | INTRAVENOUS | Status: DC
  Administered 2014-11-04 – 2014-11-05 (×3): 1 g via INTRAVENOUS
  Filled 2014-11-04 (×8): qty 50

## 2014-11-04 MED ORDER — POTASSIUM CHLORIDE CRYS ER 20 MEQ PO TBCR
40.0000 meq | EXTENDED_RELEASE_TABLET | ORAL | Status: AC
  Administered 2014-11-04 (×2): 40 meq via ORAL
  Filled 2014-11-04 (×2): qty 2

## 2014-11-04 NOTE — Progress Notes (Signed)
[facility] Progress Note   [name] YQM:[id number] DOB: 1960-10-27 DOA: 10/12/2014 PCP: Barbette Merino, MD  Brief narrative: [name] is a 54 y.o. female with a past medical history of HIV (last CD4 count greater than 300), bipolar disorder, hypertension, COPD and alcohol abuse. who was initially admitted to [facility] on 10/12/2014 with sepsis, aspiration pneumonia, acute encephalopathy, and acute kidney injury from obstructive uropathy. Initial hospital course was complicated by C. difficile colitis. While in [place] she developed ventilator-dependent respiratory failure and was transferred to [facility] to ICU under the care of PCCM. Marland Kitchen She was treated with IV Diflucan. Initial workup at [facility] included CT of the head and MRI of the brain which was normal, renal ultrasound which was normal, lumbar puncture which was negative for herpes or cryptococcal antigen.   Prior to this admission patient had recently been treated for severe esophagitis: underwent EGD on 12/10 and pathology was positive for Candida etiology  After transfer to [facility] follow-up chest x-ray on 10/25/14 confirmed left lower lobe collapse with consolidation and small effusion; follow-up chest x-ray on 1/6 demonstrated resolution of pulmonary edema and improving left basilar atelectasis and effusion. Her decline was attributed to sepsis, this was associated with hypotension and required pressors. She eventually improved and was extubated.  By 10/27/14 she had urinary retention and had to undergo in and out catheterization on several occasions with resultant reinsertion of Foley catheter. She has a history of urinary incontinence but no prior history of urinary retention. She was transferred to the stepdown unit on 10/28/14.   UA positive for UTI.  She's also been having issues related to tachycardia that could be related to withdrawal of clonidine or more likely related to her underlying anxiety and  psych issues and possible benzodiazepine withdrawal. On 1/12 patient's Risperdal was resumed along with one half of her usual Xanax dose.   Subjective: Continues to complain of some abdominal pain but is not having nausea and is hungry enough to eat breakfast this morning. Per RN she's having a small amount of loose stool of which she is incontinent.  Assessment/Plan: Persistent hypotension BP dropping again yesterday- given fluid boluses- hold antihypertensives. Monitor oral fluid intake and continue IV fluids as by mouth intake is poor  Sepsis Fever recurring- l follow closely (a) C. difficile colitis-Continue treatment with oral vancomycin  (b) UTI-Escherichia coli sensitive to Ancef which I have started today-as she is having issues with urinary retention will continue Foley catheter-will treat as complicated UTI for a total of 7 days   (c) Aspiration pneumonia- resolved-antibiotic course completed  Acute Urinary retention -Continue Foley catheter- Foley needed to be changed on 1/13 due to obstruction-patient's bladder was distended with an almost 1 L of fluid and she was having acute abdominal pain at the time.  Acute renal failure Returned w/ episode of urinary retention and hypotension - appears to be resolved- avoid ACE I for now  Acute encephalopathy Resolved-  Low-dose benzodiazepine  Sinus tachycardia Spent multifactorial related to ongoing GI pain as well as anxiety and need to resume psychotropic medications   Symptomatic Anemia Baseline hemoglobin between 10-11 chronically, but this admit has been steadily ~ 7.0 - hemoglobin 7.1 1/8 w/o evidence of acute blood loss but w/ hypotension and tachycardia so was transfused PRBCs - anemia panel c/w anemia related to malnutrition (NOT Fe deficient) - Hgb remains stable around 9  Acute respiratory failure with hypoxia due to aspiration pneumonia  - resolved  Human  immunodeficiency virus (HIV) disease Followed by ID -  continue ART at their discretion - CD4 count greater than 1300 in September  COPD / moderate pulmonary hypertension Pulmonary hypertension on echocardiogram this admission - COPD compensated   Grade 1 diastolic dysfunction Finding on echocardiogram this admission - has preserved LV function   Chronic pancreatitis Currently asymptomatic - did not require pancreatic enzymes prior to admission  Bipolar 1 disorder On Risperdal prior to admission-resumed on 1/12  Chronic pain Prior to admission patient was on Ultram, Valium (for muscle spasm), Robaxin, oxycodone   Candida esophagitis completed course of Diflucan - has a history of GERD as well but given acute C. difficile colitis would like to avoid PPI so initially tried when necessary GI cocktail with limited success so on 1/12 added Carafate - swallow evaluation recommended dysphagia 3 diet w/ thickened liquids -    Code Status: Full code Family Communication:  Disposition Plan: home  DVT prophylaxis: lovenox  Consultants: PCCM ID  Procedures: TTE 10/22/14 - EF 60-65% - no WMA - grade 1 DD - PA pressure 61mm Hg (mod pulm HTN)  Antibiotics: Anti-infectives    Start     Dose/Rate Route Frequency Ordered Stop   11/04/14 1000  ceFAZolin (ANCEF) IVPB 1 g/50 mL premix     1 g100 mL/hr over 30 Minutes Intravenous 3 times per day 11/04/14 0906 11/07/14 1359   10/31/14 1200  vancomycin (VANCOCIN) 50 mg/mL oral solution 125 mg     125 mg Oral 4 times per day 10/31/14 1147     10/30/14 1800  ciprofloxacin (CIPRO) IVPB 400 mg  Status:  Discontinued    Comments:  Pharmacy may adjust dose prn   400 mg200 mL/hr over 60 Minutes Intravenous Every 12 hours 10/30/14 1638 10/31/14 1731   10/30/14 1800  vancomycin (VANCOCIN) 50 mg/mL oral solution 125 mg  Status:  Discontinued     125 mg Oral 4 times daily 10/30/14 1737 10/31/14 1145   10/26/14 0800  elvitegravir-cobicistat-emtricitabine-tenofovir (GENVOYA) 150-150-200-10 MG tablet 1 tablet     Comments:  May give PER NG TUBE, MAY CRUSH thought not completely bioequivalent to GENVOYA po   1 tablet Oral Daily with breakfast 10/25/14 1501     10/26/14 0800  Darunavir Ethanolate (PREZISTA) tablet 800 mg     800 mg Per NG tube Daily with breakfast 10/25/14 1501     10/22/14 1000  fluconazole (DIFLUCAN) IVPB 200 mg  Status:  Discontinued     200 mg100 mL/hr over 60 Minutes Intravenous Every 24 hours 10/21/14 2231 10/27/14 1325   10/21/14 2300  piperacillin-tazobactam (ZOSYN) IVPB 3.375 g  Status:  Discontinued     3.375 g12.5 mL/hr over 240 Minutes Intravenous 3 times per day 10/21/14 2254 10/25/14 0843   10/21/14 2218  vancomycin (VANCOCIN) 50 mg/mL oral solution 125 mg  Status:  Discontinued     125 mg Per Tube 4 times daily 10/21/14 2215 10/30/14 1737   10/21/14 1000  fluconazole (DIFLUCAN) tablet 200 mg  Status:  Discontinued     200 mg Oral Daily 10/20/14 1610 10/21/14 2215   10/17/14 1800  vancomycin (VANCOCIN) 50 mg/mL oral solution 125 mg  Status:  Discontinued     125 mg Oral 4 times daily 10/17/14 1736 10/21/14 2215   10/17/14 1400  clindamycin (CLEOCIN) capsule 300 mg  Status:  Discontinued     300 mg Oral 3 times per day 10/17/14 1022 10/17/14 1130   10/17/14 1200  fluconazole (DIFLUCAN) tablet 100  mg  Status:  Discontinued     100 mg Oral Daily 10/17/14 1152 10/20/14 1610   10/16/14 0800  ritonavir (NORVIR) tablet 100 mg  Status:  Discontinued     100 mg Oral Daily with breakfast 10/15/14 1237 10/21/14 2215   10/16/14 0800  Darunavir Ethanolate (PREZISTA) tablet 800 mg  Status:  Discontinued     800 mg Oral Daily with breakfast 10/15/14 1237 10/21/14 2215   10/15/14 1245  raltegravir (ISENTRESS) tablet 400 mg  Status:  Discontinued     400 mg Oral 2 times daily 10/15/14 1237 10/21/14 2215   10/15/14 1245  emtricitabine-tenofovir (TRUVADA) 200-300 MG per tablet 1 tablet  Status:  Discontinued     1 tablet Oral Daily 10/15/14 1237 10/21/14 2215   10/14/14 1400  doripenem  (DORIBAX) 500 mg in sodium chloride 0.9 % 100 mL IVPB  Status:  Discontinued     500 mg100 mL/hr over 60 Minutes Intravenous 3 times per day 10/14/14 1040 10/17/14 1022   10/14/14 0600  vancomycin (VANCOCIN) IVPB 750 mg/150 ml premix  Status:  Discontinued     750 mg150 mL/hr over 60 Minutes Intravenous Every 12 hours 10/13/14 1822 10/17/14 1022   10/13/14 2200  piperacillin-tazobactam (ZOSYN) IVPB 3.375 g  Status:  Discontinued     3.375 g12.5 mL/hr over 240 Minutes Intravenous Every 8 hours 10/13/14 1822 10/14/14 1039   10/13/14 1900  vancomycin (VANCOCIN) 1,250 mg in sodium chloride 0.9 % 250 mL IVPB     1,250 mg166.7 mL/hr over 90 Minutes Intravenous  Once 10/13/14 1822 10/13/14 2109   10/13/14 1230  cefTRIAXone (ROCEPHIN) 1 g in dextrose 5 % 50 mL IVPB - Premix  Status:  Discontinued     1 g100 mL/hr over 30 Minutes Intravenous Every 24 hours 10/13/14 1113 10/14/14 0820         Objective: Filed Weights   10/25/14 0500 10/26/14 0458 10/27/14 0630  Weight: 69.3 kg (152 lb 12.5 oz) 69.8 kg (153 lb 14.1 oz) 65.3 kg (143 lb 15.4 oz)    Intake/Output Summary (Last 24 hours) at 11/04/14 1312 Last data filed at 11/04/14 1256  Gross per 24 hour  Intake   2090 ml  Output   2100 ml  Net    -10 ml     Vitals Filed Vitals:   11/04/14 1032 11/04/14 1231 11/04/14 1234 11/04/14 1308  BP: 115/72 108/75  108/70  Pulse: 128 129 128 128  Temp: 99.2 F (37.3 C) 97.8 F (36.6 C)  98.1 F (36.7 C)  TempSrc: Oral Oral    Resp: 18 18  20   Height:      Weight:      SpO2: 100% 99% 99%     Exam: General: AA- not as confused today- No acute respiratory distress Lungs: Clear to auscultation bilaterally without wheezes or crackles Cardiovascular: Regular rate and rhythm without murmur gallop or rub normal S1 and S2 Abdomen: moderate diffuse tenderness, + distended, soft, bowel sounds positive, no rebound, no ascites, no appreciable mass Extremities: No significant cyanosis, clubbing, or  edema bilateral lower extremities  Data Reviewed: Basic Metabolic Panel:  Recent Labs Lab 10/31/14 0330 11/01/14 0333 11/02/14 0416 11/03/14 0609 11/04/14 0519  NA 134* 138 138 132* 143  K 3.4* 3.1* 3.3* 3.4* 2.8*  CL 104 115* 109 106 118*  CO2 23 19 22 22 21   GLUCOSE 96 92 88 103* 95  BUN 10 10 14 8 12   CREATININE 1.19* 0.64 0.72 0.67 0.59  CALCIUM 7.5* 6.6* 7.4* 7.3* 7.5*   Liver Function Tests:  Recent Labs Lab 10/30/14 0255 10/31/14 0330 11/01/14 0333  AST 25 19 17   ALT 14 12 10   ALKPHOS 89 78 63  BILITOT 0.9 0.7 0.5  PROT 7.2 6.2 5.0*  ALBUMIN 2.1* 1.7* 1.4*   No results for input(s): LIPASE, AMYLASE in the last 168 hours. No results for input(s): AMMONIA in the last 168 hours. CBC:  Recent Labs Lab 10/30/14 0255 10/31/14 0330 11/01/14 0333 11/03/14 0609 11/04/14 0519  WBC 11.8* 10.0 7.4 13.1* 13.9*  HGB 10.7* 9.0* 9.1* 8.8* 7.5*  HCT 31.7* 27.8* 29.0* 27.9* 23.2*  MCV 86.8 85.8 87.1 88.9 88.2  PLT 392 332 322 337 317   Cardiac Enzymes:  Recent Labs Lab 10/29/14 0120 10/29/14 0657 10/29/14 1246  TROPONINI <0.03 <0.03 <0.03   BNP (last 3 results) No results for input(s): PROBNP in the last 8760 hours. CBG:  Recent Labs Lab 11/02/14 1335 11/03/14 1119  GLUCAP 81 133*    Recent Results (from the past 240 hour(s))  Urine culture     Status: None   Collection Time: 10/29/14 11:26 AM  Result Value Ref Range Status   Specimen Description URINE, CATHETERIZED  Final   Special Requests NONE  Final   Colony Count   Final    4,000 COLONIES/ML Performed at [facility]    Culture   Final    ESCHERICHIA COLI Performed at [facility]    Report Status 10/31/2014 FINAL  Final   Organism ID, Bacteria ESCHERICHIA COLI  Final      Susceptibility   Escherichia coli - MIC*    AMPICILLIN >=32 RESISTANT Resistant     CEFAZOLIN <=4 SENSITIVE Sensitive     CEFTRIAXONE <=1 SENSITIVE Sensitive     CIPROFLOXACIN >=4 RESISTANT  Resistant     GENTAMICIN <=1 SENSITIVE Sensitive     LEVOFLOXACIN >=8 RESISTANT Resistant     NITROFURANTOIN <=16 SENSITIVE Sensitive     TOBRAMYCIN <=1 SENSITIVE Sensitive     TRIMETH/SULFA >=320 RESISTANT Resistant     PIP/TAZO <=4 SENSITIVE Sensitive     * ESCHERICHIA COLI     Studies:  Recent x-ray studies have been reviewed in detail by the Attending Physician  Scheduled Meds:  Scheduled Meds: . antiseptic oral rinse  7 mL Mouth Rinse q12n4p  .  ceFAZolin (ANCEF) IV  1 g Intravenous 3 times per day  . darunavir  800 mg Per NG tube Q breakfast  . diphenoxylate-atropine  1 tablet Oral QID  . elvitegravir-cobicistat-emtricitabine-tenofovir  1 tablet Oral Q breakfast  . enoxaparin (LOVENOX) injection  40 mg Subcutaneous Q24H  . feeding supplement (ENSURE)  1 Container Oral TID BM  . nystatin-triamcinolone   Topical BID  . risperiDONE  1 mg Oral BID  . sodium chloride  10-40 mL Intracatheter Q12H  . sucralfate  1 g Oral TID WC & HS  . vancomycin  125 mg Oral 4 times per day   Continuous Infusions: . sodium chloride 20 mL/hr at 11/02/14 1641  . sodium chloride 100 mL/hr at 11/04/14 8588    Time spent on care of this patient: 44 min   Hunt, MD 11/04/2014, 1:12 PM  LOS: 23 days   [facility] Office  [phone] Pager - Text Page per [email or web address]  If 7PM-7AM, please contact night-coverage [email or web address]

## 2014-11-04 NOTE — Progress Notes (Signed)
Physical Therapy Treatment Patient Details Name: [name] MRN: [id number] DOB: [birth year] Today's Date: 11/04/2014    History of Present Illness This 54 y.o. female admitted to [facility] 12/22 with sepsis, aspiration PNA, acute encephalopathy, AKI from obstructive uropathy.  CT and MRI of brain with no acute abnormalities; MRI of thoracic and lumbar spine showed mild degenerative disease, mild spondylosis L4-5; L5-S1; Pt found to be + for C-diff.  She was transferred to [facility] 12/31 with VDRF likely due to pulmonary edema.  Pt + for encephalopathy.  PMH includes: HIV +; Bipolar disorder    PT Comments    Pt progressing slowly.  Emphasized sitting balance, standing balance/tolerance and gait stability.  Coordination of steps with RW or HHA is ataxic and poor.  Follow Up Recommendations  SNF     Equipment Recommendations  None recommended by PT    Recommendations for Other Services       Precautions / Restrictions Precautions Precautions: Fall Precaution Comments: C diff precautions    Mobility  Bed Mobility Overal bed mobility: Needs Assistance Bed Mobility: Supine to Sit     Supine to sit: Max assist        Transfers Overall transfer level: Needs assistance   Transfers: Sit to/from Stand Sit to Stand: Max assist;Mod assist;+2 physical assistance Stand pivot transfers: Mod assist;+2 physical assistance       General transfer comment: v/t cues for hand placement and assist to come forward and rise.  Ambulation/Gait Ambulation/Gait assistance: Mod assist;+2 physical assistance Ambulation Distance (Feet): 2 Feet (forward and back with HHA or RW) Assistive device: Rolling walker (2 wheeled) Gait Pattern/deviations: Step-through pattern;Shuffle;Ataxic;Trunk flexed Gait velocity: decreased   General Gait Details: Very uncoordinated steps, shuffled forward, stepping on each foot.   Stairs            Wheelchair Mobility    Modified Rankin (Stroke Patients  Only)       Balance Overall balance assessment: Needs assistance Sitting-balance support: No upper extremity supported Sitting balance-Leahy Scale: Poor Sitting balance - Comments: tends to list backward.     Standing balance-Leahy Scale: Poor                      Cognition Arousal/Alertness: Awake/alert Behavior During Therapy: Flat affect Overall Cognitive Status: Impaired/Different from baseline Area of Impairment: Orientation;Attention;Following commands;Safety/judgement;Awareness;Problem solving Orientation Level: Disoriented to;Situation Current Attention Level: Sustained   Following Commands: Follows one step commands with increased time;Follows one step commands consistently Safety/Judgement: Decreased awareness of deficits;Decreased awareness of safety   Problem Solving: Slow processing;Decreased initiation;Difficulty sequencing;Requires verbal cues;Requires tactile cues      Exercises      General Comments General comments (skin integrity, edema, etc.): EHR variable and up to 130's to 140's,  RR rapid, sats on RA 88-92%      Pertinent Vitals/Pain Pain Assessment: Faces Faces Pain Scale: Hurts even more Pain Location: knees bilaterally during ROM Pain Descriptors / Indicators: Discomfort Pain Intervention(s): Monitored during session;Limited activity within patient's tolerance    Home Living                      Prior Function            PT Goals (current goals can now be found in the care plan section) Acute Rehab PT Goals PT Goal Formulation: With patient Time For Goal Achievement: 11/10/14 Potential to Achieve Goals: Fair Progress towards PT goals: Progressing toward goals    Frequency  Min  2X/week    PT Plan Current plan remains appropriate    Co-evaluation             End of Session     Patient left: with call bell/phone within reach;with nursing/sitter in room;Other (comment) (left on [facility])     Time:  7622-6333 PT Time Calculation (min) (ACUTE ONLY): 36 min  Charges:  $Therapeutic Activity: 23-37 mins                    G Codes:      Maleke Feria, Tessie Fass 11/04/2014, 1:26 PM 11/04/2014  Donnella Sham, PT [phone]  (pager)

## 2014-11-04 NOTE — Clinical Social Work Note (Signed)
BSW intern gave patient's sister and niece bed offers to [facility] and [facility]. Niece stated that they would not prefer [facility]. First contact [facility]) on face sheet would not like to be the discission maker in this process due to past experiences and would like second contact [facility]) to be the discission maker.   Kingsley Spittle, [place] Intern, [phone]

## 2014-11-05 LAB — BASIC METABOLIC PANEL
ANION GAP: 4 — AB (ref 5–15)
BUN: 6 mg/dL (ref 6–23)
CO2: 18 mmol/L — ABNORMAL LOW (ref 19–32)
Calcium: 7.5 mg/dL — ABNORMAL LOW (ref 8.4–10.5)
Chloride: 118 mEq/L — ABNORMAL HIGH (ref 96–112)
Creatinine, Ser: 0.45 mg/dL — ABNORMAL LOW (ref 0.50–1.10)
GFR calc Af Amer: >90 mL/min (ref >90)
GFR calc non Af Amer: >90 mL/min (ref >90)
Glucose, Bld: 100 mg/dL — ABNORMAL HIGH (ref 70–99)
POTASSIUM: 3 mmol/L — AB (ref 3.5–5.1)
SODIUM: 140 mmol/L (ref 135–145)

## 2014-11-05 LAB — TYPE AND SCREEN
ABO/RH(D): A POS
ANTIBODY SCREEN: NEGATIVE
UNIT DIVISION: 0

## 2014-11-05 LAB — MAGNESIUM: MAGNESIUM: 1.4 mg/dL — AB (ref 1.5–2.5)

## 2014-11-05 LAB — CBC
HCT: 25.4 % — ABNORMAL LOW (ref 36.0–46.0)
Hemoglobin: 8.3 g/dL — ABNORMAL LOW (ref 12.0–15.0)
MCH: 28.1 pg (ref 26.0–34.0)
MCHC: 32.7 g/dL (ref 30.0–36.0)
MCV: 86.1 fL (ref 78.0–100.0)
PLATELETS: 292 10*3/uL (ref 150–400)
RBC: 2.95 MIL/uL — ABNORMAL LOW (ref 3.87–5.11)
RDW: 17.4 % — ABNORMAL HIGH (ref 11.5–15.5)
WBC: 13.2 10*3/uL — AB (ref 4.0–10.5)

## 2014-11-05 LAB — OCCULT BLOOD X 1 CARD TO LAB, STOOL: FECAL OCCULT BLD: POSITIVE — AB

## 2014-11-05 MED ORDER — VANCOMYCIN 50 MG/ML ORAL SOLUTION: ORAL | Status: AC

## 2014-11-05 MED ORDER — MAGNESIUM SULFATE 2 GM/50ML IV SOLN
2.0000 g | Freq: Once | INTRAVENOUS | Status: AC
Start: 2014-11-05 — End: 2014-11-05
  Administered 2014-11-05: 2 g via INTRAVENOUS
  Filled 2014-11-05: qty 50

## 2014-11-05 MED ORDER — ELVITEG-COBIC-EMTRICIT-TENOFAF 150-150-200-10 MG PO TABS: 1.0000 | ORAL_TABLET | Freq: Every day | ORAL | Status: AC

## 2014-11-05 MED ORDER — VERAPAMIL HCL ER 240 MG PO TBCR
240.0000 mg | EXTENDED_RELEASE_TABLET | Freq: Every day | ORAL | Status: DC
  Administered 2014-11-05: 240 mg via ORAL
  Filled 2014-11-05: qty 1

## 2014-11-05 MED ORDER — ENSURE PUDDING PO PUDG: 1.0000 | Freq: Three times a day (TID) | ORAL | Status: AC

## 2014-11-05 MED ORDER — ALPRAZOLAM 0.5 MG PO TABS: 0.5000 mg | ORAL_TABLET | Freq: Two times a day (BID) | ORAL | Status: AC | PRN

## 2014-11-05 MED ORDER — POTASSIUM CHLORIDE CRYS ER 20 MEQ PO TBCR
40.0000 meq | EXTENDED_RELEASE_TABLET | ORAL | Status: AC
  Administered 2014-11-05 (×2): 40 meq via ORAL
  Filled 2014-11-05 (×2): qty 2

## 2014-11-05 MED ORDER — ACETAMINOPHEN 325 MG PO TABS: 650.0000 mg | ORAL_TABLET | Freq: Four times a day (QID) | ORAL | Status: AC | PRN

## 2014-11-05 NOTE — Clinical Social Work Note (Signed)
Clinical Social Worker facilitated patient discharge including contacting patient family and facility to confirm patient discharge plans.  Clinical information faxed to facility and family agreeable with plan.  CSW arranged ambulance transport via [facility] to [facility].  RN to call report prior to discharge.  Clinical Social Worker will sign off for now as social work intervention is no longer needed. Please consult us again if new need arises.  Jesse Delise Simenson, LCSW [phone] 

## 2014-11-05 NOTE — Progress Notes (Signed)
Patient was discharged to nursing home [facility]) by MD order; discharged instructions  review and sent to facility via EMS with prescriptions; IV DIC; PICC-line removed; patient is going to facility with foley, per MD order; skin intact; multiple attempts to give report yo facility with no result. RN left a message to facility to call hospital when they will decide which nurse is going to receive the patient; patient will be transported to facility via EMS.

## 2014-11-05 NOTE — Discharge Summary (Addendum)
Physician Discharge Summary  Jadah Bobak JSE:[id number] DOB: 1961-01-02 DOA: 10/12/2014  PCP: Barbette Merino, MD  Admit date: 10/12/2014 Discharge date: 11/05/2014  Time spent: 55 minutes  Recommendations for Outpatient Follow-up:  1. Voiding trial in 2-3 days 2. Stop Keflex on 11/07/14 3. Stop Vancomycin 11/03/14 4. Follow BP and HR - Verapamil resumed today  Discharge Condition: stable Diet recommendation: dysphagia 3 diet w/ thickened liquids -   Discharge Diagnoses:  Active Problems:   Human immunodeficiency virus (HIV) disease   Essential hypertension   Chronic pancreatitis   Anxiety   Bipolar 1 disorder   Anemia   Acute respiratory failure with hypoxia   Acute renal failure   Acute encephalopathy   Sepsis   Aspiration pneumonia   Clostridium difficile colitis   Candida UTI   Candida esophagitis   Pseudomonas aeruginosa infection   Urinary retention   History of present illness:  Kaitlan Bin is a 54 y.o. female with a past medical history of HIV (last CD4 count greater than 300), bipolar disorder, hypertension, COPD and alcohol abuse. who was initially admitted to [facility] on 10/12/2014 with sepsis, aspiration pneumonia, acute encephalopathy, and acute kidney injury from obstructive uropathy. Initial hospital course was complicated by C. difficile colitis. While in [place] she developed ventilator-dependent respiratory failure and was transferred to [facility] to ICU under the care of PCCM. Marland Kitchen She was treated with IV Diflucan. Initial workup at [facility] included CT of the head and MRI of the brain which was normal, renal ultrasound which was normal, lumbar puncture which was negative for herpes or cryptococcal antigen.   Prior to this admission patient had recently been treated for severe esophagitis: underwent EGD on 12/10 and pathology was positive for Candida etiology  After transfer to [facility] follow-up chest x-ray on 10/25/14 confirmed left  lower lobe collapse with consolidation and small effusion; follow-up chest x-ray on 1/6 demonstrated resolution of pulmonary edema and improving left basilar atelectasis and effusion. Her decline was attributed to sepsis, this was associated with hypotension and required pressors. She eventually improved and was extubated.  By 10/27/14 she had urinary retention and had to undergo in and out catheterization on several occasions with resultant reinsertion of Foley catheter. She has a history of urinary incontinence but no prior history of urinary retention. She was transferred to the stepdown unit on 10/28/14.   UA positive for UTI.  She's also been having issues related to tachycardia that could be related to withdrawal of clonidine and Verapamil. BP had previously been too low to resume these but now is stable enough to resume today.    Hospital Course:  Sepsis Fever recurring- l follow closely (a) C. difficile colitis-Continue treatment with oral vancomycin - course to be extended for 10 days after treatment with Keflex completed- end date 1/31 (b) UTI-Escherichia coli sensitive to Ancef which I have started today-as she is having issues with urinary retention will continue Foley catheter-will treat as complicated UTI for a total of 7 days - end date 11/07/14 (c) Aspiration pneumonia- resolved-antibiotic course completed  Acute Urinary retention -Continue Foley catheter- Foley needed to be changed on 1/13 due to obstruction-patient's bladder was distended with an almost 1 L of fluid and she was having acute abdominal pain at the time. - will need a voiding trail in 2-3 days  Acute renal failure Returned w/ episode of urinary retention and hypotension - appears to be resolved- avoid ACE I for now  Acute encephalopathy Resolved- appears  to have some dementia at baseline? Poor short term memory.  Sinus tachycardia - Spent multifactorial related to ongoing GI pain as well as anxiety - have  resumed Verapamil today  Symptomatic Anemia Baseline hemoglobin between 10-11 chronically, but this admit has been steadily ~ 7.0 - hemoglobin 7.1 1/8 w/o evidence of acute blood loss but w/ hypotension and tachycardia so was transfused PRBCs - anemia panel c/w anemia related to malnutrition (NOT Fe deficient) - Hgb remains stable around 8-9 range  Acute respiratory failure with hypoxia due to aspiration pneumonia  - resolved  Human immunodeficiency virus (HIV) disease Followed by ID - continue ART  - CD4 count greater than 1300 in September  COPD / moderate pulmonary hypertension Pulmonary hypertension on echocardiogram this admission - COPD compensated   Grade 1 diastolic dysfunction Finding on echocardiogram this admission - has preserved LV function   Chronic pancreatitis Currently asymptomatic - did not require pancreatic enzymes prior to admission  Bipolar 1 disorder On Risperdal and Seroquel prior to admission-resumed Risperdal- hold off on adding Seroquel to prevent neurolept malignant syndrome- she is stable on Risperdal alone  Candida esophagitis completed course of Diflucan - has a history of GERD as well but given acute C. difficile colitis would like to avoid PPI so initially tried when necessary GI cocktail with limited success so on 1/12 added Carafate - swallow evaluation recommended dysphagia 3 diet w/ thickened liquids -  Procedures: TTE 10/22/14 - EF 60-65% - no WMA - grade 1 DD - PA pressure 68mm Hg (mod pulm HTN) Consultations: PCCM ID  Discharge Exam: Filed Weights   10/25/14 0500 10/26/14 0458 10/27/14 0630  Weight: 69.3 kg (152 lb 12.5 oz) 69.8 kg (153 lb 14.1 oz) 65.3 kg (143 lb 15.4 oz)   Filed Vitals:   11/05/14 0634  BP: 123/81  Pulse: 118  Temp: 98.2 F (36.8 C)  Resp: 22    General: AA- oriented to person and place- not very oriented to situration, no distress Cardiovascular: RRR, no murmurs  Respiratory: clear to auscultation  bilaterally GI: soft, non-tender, non-distended, bowel sound positive  Discharge Instructions You were cared for by a hospitalist during your hospital stay. If you have any questions about your discharge medications or the care you received while you were in the hospital after you are discharged, you can call the unit and asked to speak with the hospitalist on call if the hospitalist that took care of you is not available. Once you are discharged, your primary care physician will handle any further medical issues. Please note that NO REFILLS for any discharge medications will be authorized once you are discharged, as it is imperative that you return to your primary care physician (or establish a relationship with a primary care physician if you do not have one) for your aftercare needs so that they can reassess your need for medications and monitor your lab values.      Discharge Instructions    Diet - low sodium heart healthy    Complete by:  As directed      Increase activity slowly    Complete by:  As directed             Medication List    STOP taking these medications        cloNIDine 0.2 MG tablet  Commonly known as:  CATAPRES     diazepam 5 MG tablet  Commonly known as:  VALIUM     diphenoxylate-atropine 2.5-0.025 MG per tablet  Commonly known as:  LOMOTIL     emtricitabine-tenofovir 200-300 MG per tablet  Commonly known as:  TRUVADA     esomeprazole 40 MG capsule  Commonly known as:  NEXIUM     lisinopril 20 MG tablet  Commonly known as:  PRINIVIL,ZESTRIL     lubiprostone 8 MCG capsule  Commonly known as:  AMITIZA     methocarbamol 750 MG tablet  Commonly known as:  ROBAXIN     oxybutynin 10 MG 24 hr tablet  Commonly known as:  DITROPAN-XL     oxyCODONE-acetaminophen 7.5-325 MG per tablet  Commonly known as:  PERCOCET     promethazine 25 MG tablet  Commonly known as:  PHENERGAN     raltegravir 400 MG tablet  Commonly known as:  ISENTRESS     ritonavir  100 MG Tabs tablet  Commonly known as:  NORVIR     zolpidem 5 MG tablet  Commonly known as:  AMBIEN      TAKE these medications        acetaminophen 325 MG tablet  Commonly known as:  TYLENOL  Take 2 tablets (650 mg total) by mouth every 6 (six) hours as needed for mild pain or moderate pain.     albuterol (2.5 MG/3ML) 0.083% nebulizer solution  Commonly known as:  PROVENTIL  Take 2.5 mg by nebulization every 6 (six) hours as needed for wheezing or shortness of breath.     albuterol 108 (90 BASE) MCG/ACT inhaler  Commonly known as:  PROVENTIL HFA;VENTOLIN HFA  Inhale 2 puffs into the lungs every 4 (four) hours as needed for shortness of breath.     ALPRAZolam 0.5 MG tablet  Commonly known as:  XANAX  Take 1 tablet (0.5 mg total) by mouth 2 (two) times daily as needed for anxiety.     cyanocobalamin 1000 MCG/ML injection  Commonly known as:  (VITAMIN B-12)  Inject 1,000 mcg into the muscle every 30 (thirty) days.     elvitegravir-cobicistat-emtricitabine-tenofovir 150-150-200-10 MG Tabs tablet  Commonly known as:  GENVOYA  Take 1 tablet by mouth daily with breakfast.     feeding supplement (ENSURE) Pudg  Take 1 Container by mouth 3 (three) times daily between meals.     ondansetron 4 MG tablet  Commonly known as:  ZOFRAN  Take 4 mg by mouth every 8 (eight) hours as needed for nausea.     potassium chloride 10 MEQ tablet  Commonly known as:  K-DUR  Take 10 mEq by mouth daily.     PREZISTA 800 MG tablet  Generic drug:  Darunavir Ethanolate  Take 800 mg by mouth daily with breakfast.     QVAR 80 MCG/ACT inhaler  Generic drug:  beclomethasone  Inhale 2 puffs into the lungs 2 (two) times daily.     risperiDONE 1 MG tablet  Commonly known as:  RISPERDAL  Take 1 mg by mouth 2 (two) times daily.     tiotropium 18 MCG inhalation capsule  Commonly known as:  SPIRIVA  Place 18 mcg into inhaler and inhale daily.     traMADol 50 MG tablet  Commonly known as:  ULTRAM   Take 50-100 mg by mouth every 6 (six) hours as needed for moderate pain.     vancomycin 50 mg/mL oral solution  Commonly known as:  VANCOCIN  125 mg every 6 hrs- continue for 10 days after Keflex is finished.     verapamil 240 MG CR tablet  Commonly known as:  CALAN-SR  Take 240 mg by mouth daily.       Allergies  Allergen Reactions  . Aspirin Nausea And Vomiting  . Sulfonamide Derivatives Hives   Follow-up Information    Follow up with [facility].   Contact information:   [address] [place] [address] [phone]        The results of significant diagnostics from this hospitalization (including imaging, microbiology, ancillary and laboratory) are listed below for reference.    Significant Diagnostic Studies: Dg Chest 1 View  10/21/2014   CLINICAL DATA:  Follow-up endotracheal tube placement.  EXAM: CHEST - 1 VIEW  COMPARISON:  Prior radiograph performed earlier on the same day.  FINDINGS: Endotracheal tube in place with tip located approximately 3.2 cm above the carina.  Severe cardiomegaly again seen. Bibasilar airspace opacities are slightly worsened, favored to reflect progressive pulmonary edema. Rapidly progressive in infection could also be considered. Right pleural effusion with probable left pleural effusion present. No pneumothorax.  Osseous structures unchanged. Right shoulder arthroplasty partially visualized.  IMPRESSION: 1. Tip of the endotracheal tube 3.2 cm above the carina. 2. Interval worsening and bilateral airspace disease, favored to reflect progressive edema, although rapidly progressive pneumonia could also be considered. 3. Bilateral pleural effusions.   Electronically Signed   By: Jeannine Boga M.D.   On: 10/21/2014 19:40   Dg Chest 1 View  10/21/2014   CLINICAL DATA:  Shortness of breath.  Pneumonia.  EXAM: CHEST - 1 VIEW  COMPARISON:  10/17/2014  FINDINGS: Very low lung volumes compared to prior. There are  increasing hazy opacity at the lung bases, left greater than right. Crowding of bronchovascular structures with mixed interstitial and alveolar perihilar opacities. Cardiac silhouette is obscured and not well evaluated. No pneumothorax. Stable appearance of the osseous structures.  IMPRESSION: Very low lung volumes with increased hazy bibasilar opacities, left greater than right. Development of perihilar mixed interstitial and alveolar opacities. CHF versus progressive pneumonia or aspiration.   Electronically Signed   By: Jeb Levering M.D.   On: 10/21/2014 15:39   Dg Chest 1 View  10/13/2014   CLINICAL DATA:  Vomiting, cough, wheezing  EXAM: CHEST - 1 VIEW  COMPARISON:  10/12/2014  FINDINGS: There is patchy left interstitial and alveolar airspace opacities. There is mild right lung interstitial thickening. There is no pleural effusion or pneumothorax. Stable cardiomediastinal silhouette. Right shoulder arthroplasty. 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  IMPRESSION: Patchy interstitial and alveolar left lung opacities and mild right lung interstitial disease. Overall appearance may reflect atypical infection given the patient's medical history versus aspiration versus atypical pulmonary edema.   Electronically Signed   By: Kathreen Devoid   On: 10/13/2014 19:44   Ct Head Wo Contrast  10/12/2014   CLINICAL DATA:  Confusion, vomiting, altered mental status.  EXAM: CT HEAD WITHOUT CONTRAST  TECHNIQUE: Contiguous axial images were obtained from the base of the skull through the vertex without intravenous contrast.  COMPARISON:  Head CT 11/18/2009  FINDINGS: No acute intracranial hemorrhage. No focal mass lesion. No CT evidence of acute infarction. No midline shift or mass effect. No hydrocephalus. Basilar cisterns are patent. Paranasal sinuses and mastoid air cells are clear.  IMPRESSION: 1. No acute intracranial findings.  No change from prior.   Electronically Signed   By: Suzy Bouchard M.D.   On:  10/12/2014 16:54   Mr Brain Wo Contrast  10/13/2014   CLINICAL DATA:  54 year old female with HIV, altered  mental status. Agitated. Initial encounter.  EXAM: MRI HEAD WITHOUT CONTRAST  TECHNIQUE: Multiplanar, multiecho pulse sequences of the brain and surrounding structures were obtained without intravenous contrast.  COMPARISON:  Head CT without contrast 10/12/2014. Brain MRI 08/16/2011.  FINDINGS: Study is intermittently degraded by motion artifact despite repeated imaging attempts.  Stable cerebral volume since 2012. Major intracranial vascular flow voids are grossly stable. No restricted diffusion to suggest acute infarction. No midline shift, mass effect, evidence of mass lesion, ventriculomegaly, extra-axial collection or acute intracranial hemorrhage. Cervicomedullary junction and pituitary are within normal limits. Allowing for motion, gray and white matter signal throughout the brain appears stable and within normal limits for age. No contrast administered.  There is fluid or mucosal thickening in the sphenoid sinuses which is new. Other Visualized paranasal sinuses and mastoids are clear. Grossly negative orbit and scalp soft tissues. Grossly normal bone marrow signal.  IMPRESSION: Degraded by motion despite repeated imaging attempts, but this noncontrast MRI brain appears stable compared to 2012 and within normal limits for age.   Electronically Signed   By: Lars Pinks M.D.   On: 10/13/2014 13:30   Mr Thoracic Spine Wo Contrast  10/16/2014   CLINICAL DATA:  Bilateral lower extremity weakness. Altered mental status.  EXAM: MRI THORACIC SPINE WITHOUT CONTRAST  TECHNIQUE: Multiplanar, multisequence MR imaging of the thoracic spine was performed. No intravenous contrast was administered.  COMPARISON:  None.  FINDINGS: This study is degraded by patient motion. Vertebral body height and alignment are maintained. The thoracic cord is unremarkable. No obvious central canal or foraminal stenosis is  identified. Multilevel disc desiccation without focal bulge or protrusion is noted. Visualized paraspinous structures are unremarkable.  IMPRESSION: Motion degraded study demonstrates only mild degenerative disease without central canal or foraminal stenosis. No finding to explain the patient's symptoms is identified.   Electronically Signed   By: Inge Rise M.D.   On: 10/16/2014 11:27   Mr Lumbar Spine Wo Contrast  10/16/2014   CLINICAL DATA:  Bilateral leg weakness.  Altered mental status.  EXAM: MRI LUMBAR SPINE WITHOUT CONTRAST  TECHNIQUE: Multiplanar, multisequence MR imaging of the lumbar spine was performed. No intravenous contrast was administered.  COMPARISON:  MRI lumbar spine 08/02/2012.  FINDINGS: The study is degraded by patient motion. Vertebral body height and alignment are maintained. Degenerative endplate signal change at L5-S1 is stable in appearance. The conus medullaris is normal in signal and position. Visualized intra-abdominal contents are unremarkable.  L1-2:  Negative.  L2-3:  Negative.  L3-4:  Negative.  L4-5: Ligamentum flavum thickening and a tiny central protrusion. Mild central canal narrowing is present. Foramina are open.  L5-S1: Epidural fat is prominent. There is a broad-based disc bulge but the central canal and foramina appear open.  IMPRESSION: Motion degraded study demonstrates no finding to explain the patient's symptoms with mild spondylosis at L4-5 and L5-S1 not markedly changed in appearance.   Electronically Signed   By: Inge Rise M.D.   On: 10/16/2014 11:22   US Renal  10/13/2014   CLINICAL DATA:  Patient HIV positive. Renal failure. Urinary frequency and incontinence. History of uterine carcinoma.  EXAM: RENAL/URINARY TRACT ULTRASOUND COMPLETE  COMPARISON:  CT, 05/29/2013  FINDINGS: Right Kidney:  Length: 13.1 cm. Echogenicity within normal limits. No mass or hydronephrosis visualized.  Left Kidney:  Length: 12.0 cm. Echogenicity within normal limits.  No mass or hydronephrosis visualized.  Bladder:  Nondistended and not well evaluated.  IMPRESSION: Normal renal ultrasound.  No hydronephrosis.  Electronically Signed   By: Lajean Manes M.D.   On: 10/13/2014 13:29   Dg Chest Port 1 View  11/03/2014   CLINICAL DATA:  Blood in sputum today  EXAM: PORTABLE CHEST - 1 VIEW  COMPARISON:  Portable chest x-ray of 10/29/2014  FINDINGS: There does appear to be a degree of pulmonary vascular congestion present. However there is opacity which has increased at the left lung base and pneumonia with effusion cannot be excluded. The right PICC line tip overlies the expected SVC -RA junction. Cardiomegaly is stable.  IMPRESSION: Probable mild pulmonary vascular congestion. However with more opacity at the left lung base, pneumonia and effusion cannot be excluded.   Electronically Signed   By: Ivar Drape M.D.   On: 11/03/2014 08:17   Dg Chest Port 1 View  10/29/2014   CLINICAL DATA:  Shortness of breath  EXAM: PORTABLE CHEST - 1 VIEW  COMPARISON:  10/27/2014  FINDINGS: Cardiac shadow is stable. Persistent changes are noted in the left lung base. Interval clearing in the right base is noted. No new focal infiltrate is seen. Postsurgical changes in the right shoulder are noted. No acute bony abnormality is seen.  IMPRESSION: Persistent left basilar changes. Interval clearing is noted on the right.   Electronically Signed   By: Inez Catalina M.D.   On: 10/29/2014 07:27   Dg Chest Port 1 View  10/27/2014   CLINICAL DATA:  Hypoxia.  EXAM: PORTABLE CHEST - 1 VIEW  COMPARISON:  10/26/2014, 10/25/2014 and 10/24/2014  FINDINGS: Endotracheal tube and OG tube have been removed.  Pulmonary edema has resolved. There is slight atelectasis at the right base. Atelectasis/effusion at the left base has improved slightly.  IMPRESSION: 1. Resolution of pulmonary edema. 2. Improving left base atelectasis/ effusion. 3. Residual right base atelectasis.   Electronically Signed   By: Rozetta Nunnery  M.D.   On: 10/27/2014 07:31   Dg Chest Port 1 View  10/26/2014   CLINICAL DATA:  Sob,,resp distress,fluid on lungs.  EXAM: PORTABLE CHEST - 1 VIEW  COMPARISON:  10/25/2014  FINDINGS: Endotracheal tube has tip 2.5 cm above the carinal. The enteric tube courses through the region of the stomach and off the inferior portion of the film as tip is not visualized. The side port of the enteric tube is over the stomach.  Lungs are adequately inflated as patient is rotated to the right. There is continued bilateral hazy perihilar opacification likely mild interstitial edema. There is stable left base/ retrocardiac opacification likely combination of effusion with partial left lower lobe collapse/atelectasis. Slight worsening right base opacification likely small effusion with atelectasis. Cannot completely exclude infection in the lung bases. Cardiomediastinal silhouette and remainder of the exam is unchanged.  IMPRESSION: Stable left base/ retrocardiac opacification likely combination of effusion and lower lobe collapse. Slight worsening right base opacification likely small effusion with atelectasis.  Mild perihilar opacification likely mild interstitial edema.  Tubes and lines as described.   Electronically Signed   By: Marin Olp M.D.   On: 10/26/2014 08:59   Dg Chest Port 1 View  10/25/2014   CLINICAL DATA:  54 year old female with acute respiratory failure, hypoxia. Current history of HIV, renal failure. Initial encounter.  EXAM: PORTABLE CHEST - 1 VIEW  COMPARISON:  10/24/2014 and earlier.  FINDINGS: Portable AP semi upright view at 0451 hrs. Stable endotracheal tube. Stable visualized enteric tube, side hole the level of the proximal stomach. Stable cardiac size and mediastinal contours. Stable lung volumes. Confluent  left lower lobe collapse/consolidation. Small left pleural effusion. No pneumothorax. Increased interstitial markings elsewhere. Ventilation overall has improved since 10/23/2014, stable since  yesterday. No areas of worsening ventilation. Stable visualized osseous structures including right shoulder are plastic.  IMPRESSION: 1.  Stable lines and tubes. 2. Stable ventilation since yesterday, improved since 10/23/2014. Interstitial opacity diffusely with left lower lobe collapse/consolidation and small effusion.   Electronically Signed   By: Lars Pinks M.D.   On: 10/25/2014 07:30   Dg Chest Port 1 View  10/24/2014   CLINICAL DATA:  intubated  EXAM: PORTABLE CHEST - 1 VIEW  COMPARISON:  the previous day's study  FINDINGS: Endotracheal tube and nasogastric tube are stable in position. Mild diffuse interstitial edema or infiltrates, improved since previous exam. Persistent left retrocardiac consolidation/atelectasis. Probable small layering left effusion. Right shoulder arthroplasty partially visualized  IMPRESSION: 1. Mild bilateral edema/infiltrates, improved since previous exam. 2.  Support hardware stable in position.   Electronically Signed   By: Arne Cleveland M.D.   On: 10/24/2014 08:19   Dg Chest Port 1 View  10/23/2014   CLINICAL DATA:  Acute respiratory failure  EXAM: PORTABLE CHEST - 1 VIEW  COMPARISON:  10/22/2014  FINDINGS: Endotracheal tube tip 4 cm above the carina. Nasogastric tube enters the stomach.  Bilateral airspace opacities, essentially stable on the right and increased on the left. Suspected layering right pleural effusion. Upper normal heart size. Suspected layering right pleural effusion.  Avascular necrosis, left humeral head. Prior right proximal humeral implant.  IMPRESSION: 1. Bilateral airspace opacities, increased on the left compared to the prior exam, potentially from asymmetric pulmonary edema or bilateral pneumonia. Suspected layering right pleural effusion.   Electronically Signed   By: Sherryl Barters M.D.   On: 10/23/2014 09:15   Dg Chest Port 1 View  10/22/2014   CLINICAL DATA:  Evaluate pneumonia.  EXAM: PORTABLE CHEST - 1 VIEW  COMPARISON:  10/21/2014  FINDINGS:  Endotracheal tube is unchanged. Interval placement of NG tube which appears to double back on itself into the esophagus. The tip is not well visualized but likely in the upper cervical esophagus or neck region.  Bilateral airspace opacities are again noted with slight improvement on the left. No change on the right. Bilateral pleural effusions are noted, right greater than left. The right effusion has increased slightly since prior study. Mild cardiomegaly.  IMPRESSION: Bilateral airspace opacity, right greater than left. Some improvement in the left airspace disease since prior study.  Bilateral effusions, increasing on the right.  NG tube appears to double back on itself and pass superiorly in the esophagus with the tip likely in the neck.  These results were called by telephone at the time of interpretation on 10/22/2014 at 8:59 am to patient's nurse, Lenna Sciara, who verbally acknowledged these results.   Electronically Signed   By: Rolm Baptise M.D.   On: 10/22/2014 09:00   Dg Chest Port 1 View  10/17/2014   CLINICAL DATA:  Fever  EXAM: PORTABLE CHEST - 1 VIEW  COMPARISON:  10/13/2014  FINDINGS: Cardiomediastinal silhouette is stable. No acute infiltrate or pulmonary edema. Again noted right shoulder prosthesis. Stable degenerative changes and remodeling left humeral head.  IMPRESSION: No active disease.   Electronically Signed   By: Lahoma Crocker M.D.   On: 10/17/2014 17:24   Dg Chest Port 1 View  10/12/2014   CLINICAL DATA:  Vomiting  EXAM: PORTABLE CHEST - 1 VIEW  COMPARISON:  03/08/2014  FINDINGS: Lordotic positioning. Mild  bibasilar atelectasis. No definite pneumonia. Negative for heart failure or effusion.  IMPRESSION: Mild bibasilar atelectasis.   Electronically Signed   By: Franchot Gallo M.D.   On: 10/12/2014 16:36   Dg Abd Portable 1v  10/30/2014   CLINICAL DATA:  54 year old female with abdominal pain and weakness.  EXAM: PORTABLE ABDOMEN - 1 VIEW  COMPARISON:  10/22/2014.  FINDINGS: Oral contrast  material from recent swallowing study is noted throughout the colon extending to the level of the distal rectum. No pathologic dilatation of small bowel. No pneumoperitoneum. Surgical clips project over the right upper quadrant of the abdomen, compatible with prior cholecystectomy.  IMPRESSION: 1. Nonobstructive bowel gas pattern. 2. No pneumoperitoneum. 3. Status post cholecystectomy.   Electronically Signed   By: Vinnie Langton M.D.   On: 10/30/2014 14:01   Dg Abd Portable 1v  10/22/2014   CLINICAL DATA:  Nasogastric tube placement  EXAM: PORTABLE ABDOMEN - 1 VIEW  COMPARISON:  October 22, 2014, 6:06 a.m.  FINDINGS: The bowel gas pattern is normal. Nasogastric tube is identified with distal tip in the mid stomach. Right femoral line is unchanged. 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  IMPRESSION: Nasogastric tube with distal tip in the mid stomach.   Electronically Signed   By: Abelardo Diesel M.D.   On: 10/22/2014 10:47   Dg Abd Portable 1v  10/22/2014   CLINICAL DATA:  Nasogastric tube placement  EXAM: PORTABLE ABDOMEN - 1 VIEW  COMPARISON:  CT 05/29/2013  FINDINGS: Nasogastric tube extends to the gastric antrum. Stomach decompressed. Small bowel decompressed. Normal distribution of gas and stool throughout the nondilated colon. Right femoral central line extending to the level of the iliac confluence. Advanced erosive changes in bilateral hips.  IMPRESSION: 1. Nasogastric tube to decompressed stomach. 2. Nonobstructed bowel gas pattern.   Electronically Signed   By: Arne Cleveland M.D.   On: 10/22/2014 09:02   Dg Abd Portable 1v  10/12/2014   CLINICAL DATA:  Vomiting  EXAM: PORTABLE ABDOMEN - 1 VIEW  COMPARISON:  CT abdomen pelvis 05/29/2013  FINDINGS: Image quality is suboptimal due to technique and large patient size  Nonobstructive bowel gas pattern.  No dilated bowel loops.  Advanced degenerative change in both hip joints.  IMPRESSION: Negative.   Electronically Signed    By: Franchot Gallo M.D.   On: 10/12/2014 16:37   Dg Swallowing Func-speech Pathology  10/27/2014   Loletha Grayer, CCC-SLP     10/27/2014  1:11 PM Objective Swallowing Evaluation: Modified Barium Swallowing Study   Patient Details  Name: Editha Bridgeforth MRN: [id number] Date of Birth: 02-17-1961  Today's Date: 10/27/2014 Time: 1205-1229 SLP Time Calculation (min) (ACUTE ONLY): 24 min  Past Medical History:  Past Medical History  Diagnosis Date  . Baker's cyst   . Pulmonary lesion 1/03    on RUL onn CT   . Hemorrhoids   . Zoster 2001  . Family history of diabetes mellitus   . Tobacco user   . Other abnormal Papanicolaou smear of cervix and cervical  HPV(795.09)   . Herpes zoster   . Hemorrhoids   . Baker's cyst   . Pancreatitis   . HIV disease     take Prezista,Truvada,Norvir,and Isentress daily  . Bipolar disorder     With hx of psychotic features.  . Articular cartilage disorder of knee   . Bursitis of shoulder   . Rotator cuff arthropathy   . Hypertension     takes Clonidine  and Verapamil daily  . Emphysema   . Asthma     Albuterol resue inhaler and QVAR daily  . Shortness of breath     with exertion and stress  . Dizziness   . Joint pain   . Joint swelling   . Chronic back pain     buldging disc  . Bruises easily   . Gastric ulcer   . GERD (gastroesophageal reflux disease)     takes Nexium daily  . Chronic constipation   . Urinary frequency   . Urinary incontinence   . Nocturia   . History of blood transfusion at age 82  . Early cataracts, bilateral   . Anxiety   . Depression     takes Xanax daily and Depakote bid  . History of shingles   . 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 Tobacco abuse 10/26/2012  . Chronic back pain   . Chronic knee pain   . Complication of anesthesia     AGGITATED  . Uterine cancer     "had some kind of cancer in my teens; had to have TAH to remove  it"  . Headache(784.0)     "often here lately; weather changing"  . Pneumonia     "several times"  .  Arthritis     "all over" (03/16/2014)  . Osteoarthritis of left knee 03/16/2014  . Alcohol withdrawal delirium 03/22/2014   Past Surgical History:  Past Surgical History  Procedure Laterality Date  . Breast lumpectomy Right   . Breast biopsy Right ~ 1980  . Cholecystectomy  10+yrs ago  . Knee arthroscopy Left   . Finger surgery Right     rt middle finger with pin in it  . Epidural injections    . Esophagogastroduodenoscopy    . Shoulder hemi-arthroplasty  05/06/2012    Procedure: SHOULDER HEMI-ARTHROPLASTY;  Surgeon: Johnny Bridge, MD;  Location: [facility];  Service: Orthopedics;  Laterality:  Right;  . Joint replacement    . Fracture surgery      Right finger third digit  . Appendectomy  1981    with hysterectomy  . Total knee arthroplasty Left 03/16/2014    Procedure: LEFT TOTAL KNEE ARTHROPLASTY;  Surgeon: Johnny Bridge, MD;  Location: [facility];  Service: Orthopedics;  Laterality:  Left;  . Total abdominal hysterectomy  1981    w/BSO  . Esophagogastroduodenoscopy (egd) with propofol N/A 09/30/2014    Procedure: ESOPHAGOGASTRODUODENOSCOPY (EGD) WITH PROPOFOL;   Surgeon: Daneil Dolin, MD;  Location: [facility];  Service:  Endoscopy;  Laterality: N/A;  . Esophageal biopsy  09/30/2014    Procedure: GASTRIC AND ESOPHAGEAL BIOPSY;  Surgeon: Daneil Dolin, MD;  Location: [facility];  Service: Endoscopy;;   HPI:  EGD on 09/30/14 due to N/V and difficulty swallowing. 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 She was  sent to ER on 10/12/14 with vomiting blood and acute  encephalopathy. She was also in acute renal failure with  hyperkalemia and metabolic acidosis. She was started on Abx for  sepsis and possible aspiration pneumonia and eventually started  on diflucan for esophageal candidiasis. She developed diarrhea  and was found to be positive for C diff. She continued to  improve, and was transferred out of ICU on 10/19/14. She had   speech therapy swallow evaluation which was normal. She developed  progressive dyspnea on 10/21/14. She  was treated with BiPAP and  lasix, and transferred back to ICU. Her respiratory status did  not improve, and she was intubated. She was then transferred to  [facility] for further treatment. Pt was extubated 1/5.     Assessment / Plan / Recommendation Clinical Impression  Dysphagia Diagnosis: Mild pharyngeal phase dysphagia Clinical impression: Pt has a mild pharyngeal dysphagia due to  delay in swallow initiation paired with decreased airway closure,  likely acute and reversible in nature secondary to irritation  from intubation. Pilar Plate penetration occurs with thin liquids, with  reflexive throat clearing as penetrates reach the level of the  vocal cords. Given recent respiratory distress and current  altered mentation, recommend Dys 3 diet and nectar thick liquids  to maximize safety with intake. Prognosis is good for advancement  with additional time post extubation and improved mentation.    Treatment Recommendation  Therapy as outlined in treatment plan below    Diet Recommendation Dysphagia 3 (Mechanical Soft);Nectar-thick  liquid   Liquid Administration via: Cup;No straw Medication Administration: Whole meds with puree Supervision: Patient able to self feed;Full supervision/cueing  for compensatory strategies Compensations: Slow rate;Small sips/bites Postural Changes and/or Swallow Maneuvers: Seated upright 90  degrees;Upright 30-60 min after meal    Other  Recommendations Oral Care Recommendations: Oral care BID Other Recommendations: Order thickener from pharmacy;Prohibited  food (jello, ice cream, thin soups);Remove water pitcher   Follow Up Recommendations  None    Frequency and Duration min 2x/week  2 weeks   Pertinent Vitals/Pain n/a    SLP Swallow Goals     General HPI: EGD on 09/30/14 due to N/V and difficulty  swallowing. 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 She was sent to ER on 10/12/14 with vomiting blood  and acute encephalopathy. She was also in acute renal failure  with hyperkalemia and metabolic acidosis. She was started on Abx  for sepsis and possible aspiration pneumonia and eventually  started on diflucan for esophageal candidiasis. She developed  diarrhea and was found to be positive for C diff. She continued  to improve, and was transferred out of ICU on 10/19/14. She had  speech therapy swallow evaluation which was normal. She developed  progressive dyspnea on 10/21/14. She was treated with BiPAP and  lasix, and transferred back to ICU. Her respiratory status did  not improve, and she was intubated. She was then transferred to  [facility] for further treatment. Pt was extubated 1/5. Type of Study: Modified Barium Swallowing Study Reason for Referral: Objectively evaluate swallowing function Previous Swallow Assessment: BSE12/29 WFL Diet Prior to this Study: NPO;IV Temperature Spikes Noted: No Respiratory Status: Nasal cannula History of Recent Intubation: Yes Length of Intubations (days): 6 days Date extubated: 10/26/14 Behavior/Cognition: Alert;Cooperative;Confused;Distractible Oral Cavity - Dentition: Adequate natural dentition Self-Feeding Abilities: Needs assist Patient Positioning: Upright in chair Baseline Vocal Quality: Clear;Low vocal intensity Volitional Cough: Weak Volitional Swallow: Able to elicit (after prolonged delay) Anatomy: Within functional limits Pharyngeal Secretions: Not observed secondary MBS    Reason for Referral Objectively evaluate swallowing function   Oral Phase Oral Preparation/Oral Phase Oral Phase: WFL   Pharyngeal Phase Pharyngeal Phase Pharyngeal Phase: Impaired Pharyngeal - Nectar Pharyngeal - Nectar Cup: Delayed swallow initiation;Reduced  airway/laryngeal closure Pharyngeal - Thin Pharyngeal - Thin Cup: Delayed swallow initiation;Reduced  airway/laryngeal  closure;Penetration/Aspiration during swallow Penetration/Aspiration details (thin cup): Material enters  airway, remains ABOVE vocal cords and not ejected out Pharyngeal -  Solids Pharyngeal - Puree: Delayed swallow initiation;Reduced  airway/laryngeal closure Pharyngeal - Mechanical Soft: Delayed swallow initiation;Reduced  airway/laryngeal closure  Cervical Esophageal Phase    GO    Cervical Esophageal Phase Cervical Esophageal Phase: [facility]        Germain Osgood, M.A. CCC-SLP [phone]  Germain Osgood 10/27/2014, 1:10 PM    Dg Fluoro Guide Lumbar Puncture  10/14/2014   CLINICAL DATA:  Altered mental status with fever, encephalopathy and underlying HIV infection.  EXAM: DIAGNOSTIC LUMBAR PUNCTURE UNDER FLUOROSCOPIC GUIDANCE  FLUOROSCOPY TIME:  24 seconds  PROCEDURE: Time out procedure was performed. Because of mental status changes, informed consent from the patient cannot be obtained. The patient's niece provided written consent. In addition, Dr. Roderic Palau requested the procedure based on medical necessity.  With the patient prone, the lower back was prepped with Betadine. 1% Lidocaine was used for local anesthesia. Lumbar puncture was performed at the left L2-3 level using a 20 gauge needle with return of clear, colorless CSF with an opening pressure of 9 cm water. 61ml of CSF were obtained for laboratory studies. The patient tolerated the procedure well and there were no apparent complications.  IMPRESSION: Diagnostic lumbar puncture performed without immediate complications.   Electronically Signed   By: Camie Patience M.D.   On: 10/14/2014 11:49    Microbiology: Recent Results (from the past 240 hour(s))  Urine culture     Status: None   Collection Time: 10/29/14 11:26 AM  Result Value Ref Range Status   Specimen Description URINE, CATHETERIZED  Final   Special Requests NONE  Final   Colony Count   Final    4,000 COLONIES/ML Performed at [facility]    Culture   Final    ESCHERICHIA  COLI Performed at [facility]    Report Status 10/31/2014 FINAL  Final   Organism ID, Bacteria ESCHERICHIA COLI  Final      Susceptibility   Escherichia coli - MIC*    AMPICILLIN >=32 RESISTANT Resistant     CEFAZOLIN <=4 SENSITIVE Sensitive     CEFTRIAXONE <=1 SENSITIVE Sensitive     CIPROFLOXACIN >=4 RESISTANT Resistant     GENTAMICIN <=1 SENSITIVE Sensitive     LEVOFLOXACIN >=8 RESISTANT Resistant     NITROFURANTOIN <=16 SENSITIVE Sensitive     TOBRAMYCIN <=1 SENSITIVE Sensitive     TRIMETH/SULFA >=320 RESISTANT Resistant     PIP/TAZO <=4 SENSITIVE Sensitive     * ESCHERICHIA COLI     Labs: Basic Metabolic Panel:  Recent Labs Lab 11/01/14 0333 11/02/14 0416 11/03/14 0609 11/04/14 0519 11/05/14 0525 11/05/14 0530  NA 138 138 132* 143 140  --   K 3.1* 3.3* 3.4* 2.8* 3.0*  --   CL 115* 109 106 118* 118*  --   CO2 19 22 22 21  18*  --   GLUCOSE 92 88 103* 95 100*  --   BUN 10 14 8 12 6   --   CREATININE 0.64 0.72 0.67 0.59 0.45*  --   CALCIUM 6.6* 7.4* 7.3* 7.5* 7.5*  --   MG  --   --   --   --   --  1.4*   Liver Function Tests:  Recent Labs Lab 10/30/14 0255 10/31/14 0330 11/01/14 0333  AST 25 19 17   ALT 14 12 10   ALKPHOS 89 78 63  BILITOT 0.9 0.7 0.5  PROT 7.2 6.2 5.0*  ALBUMIN 2.1* 1.7* 1.4*   No results for input(s): LIPASE, AMYLASE in the  last 168 hours. No results for input(s): AMMONIA in the last 168 hours. CBC:  Recent Labs Lab 10/31/14 0330 11/01/14 0333 11/03/14 0609 11/04/14 0519 11/04/14 2320 11/05/14 0525  WBC 10.0 7.4 13.1* 13.9*  --  13.2*  HGB 9.0* 9.1* 8.8* 7.5* 8.5* 8.3*  HCT 27.8* 29.0* 27.9* 23.2* 25.9* 25.4*  MCV 85.8 87.1 88.9 88.2  --  86.1  PLT 332 322 337 317  --  292   Cardiac Enzymes:  Recent Labs Lab 10/29/14 1246  TROPONINI <0.03   BNP: BNP (last 3 results) No results for input(s): PROBNP in the last 8760 hours. CBG:  Recent Labs Lab 11/02/14 1335 11/03/14 1119  GLUCAP 81 133*        SignedDebbe Odea, MD [facility] 11/05/2014, 11:23 AM

## 2014-11-15 ENCOUNTER — Other Ambulatory Visit: Payer: Self-pay

## 2014-11-15 DIAGNOSIS — R1013 Epigastric pain: Secondary | ICD-10-CM

## 2014-11-15 DIAGNOSIS — R1030 Lower abdominal pain, unspecified: Secondary | ICD-10-CM

## 2014-11-22 DEATH — deceased

## 2014-12-22 ENCOUNTER — Ambulatory Visit: Payer: Medicare Other | Admitting: Gastroenterology

## 2014-12-22 ENCOUNTER — Encounter: Payer: Self-pay | Admitting: Gastroenterology

## 2014-12-22 ENCOUNTER — Telehealth: Payer: Self-pay | Admitting: Internal Medicine

## 2014-12-22 NOTE — Telephone Encounter (Signed)
PATIENT WAS A NO SHOW 12/22/14 AND LETTER SENT

## 2016-01-29 IMAGING — CT CT HEAD W/O CM
1 series · 16 of 30 positions shown, 20 images · non-contrast
Comparison: Head CT 11/18/2009

CLINICAL DATA: Confusion, vomiting, altered mental status.

EXAM:
CT HEAD WITHOUT CONTRAST
TECHNIQUE: Contiguous axial images were obtained from the base of the skull
through the vertex without intravenous contrast.

[Series 2: headseq 4.8 h37s · axial · 0.43mm/px · z∈[+100,+231]mm · 16 of 30 slices shown, 20 images]
[im 2/30  brain]
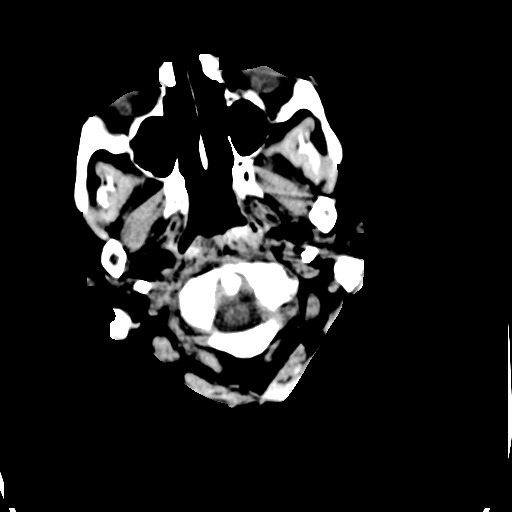
[im 2/30  bone]
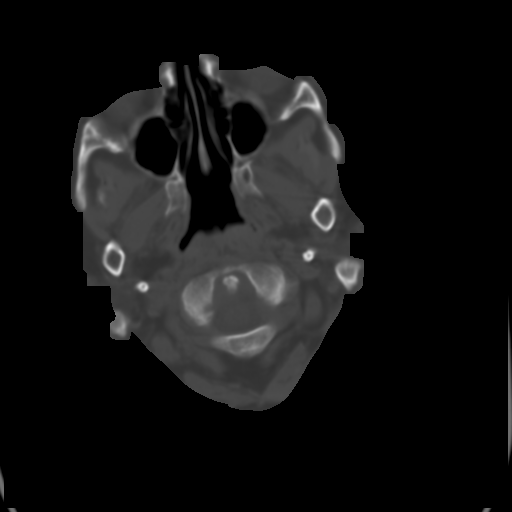
[im 4/30  brain]
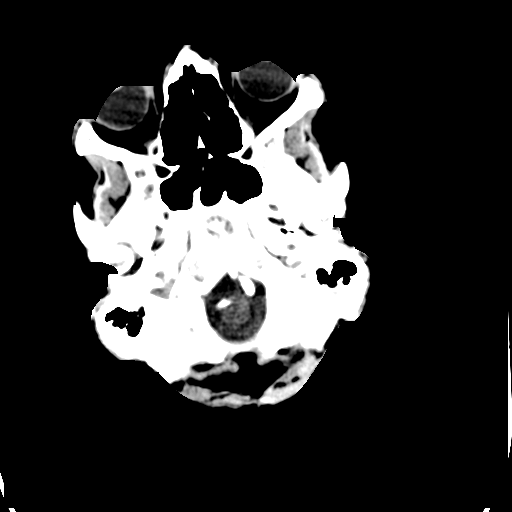
[im 6/30  brain]
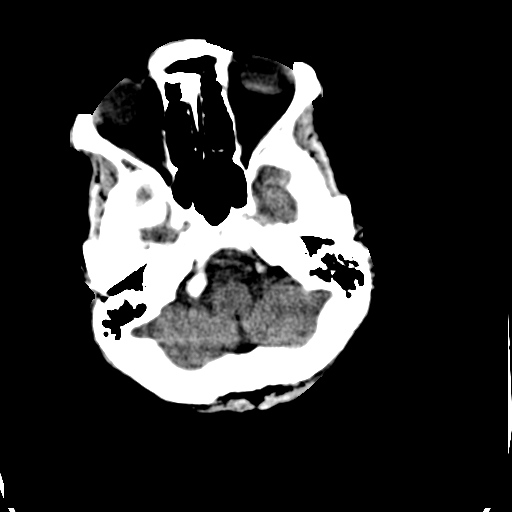
[im 8/30  brain]
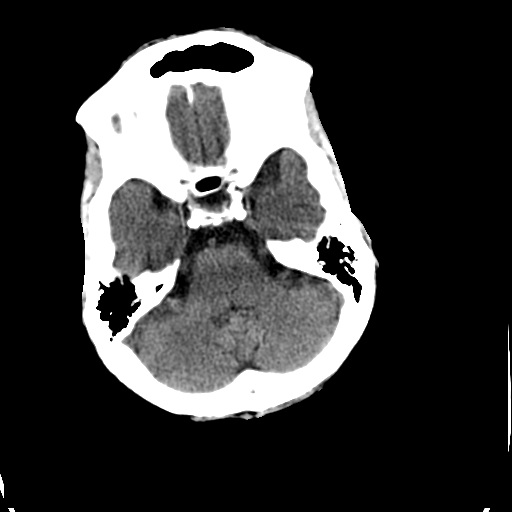
[im 9/30  brain]
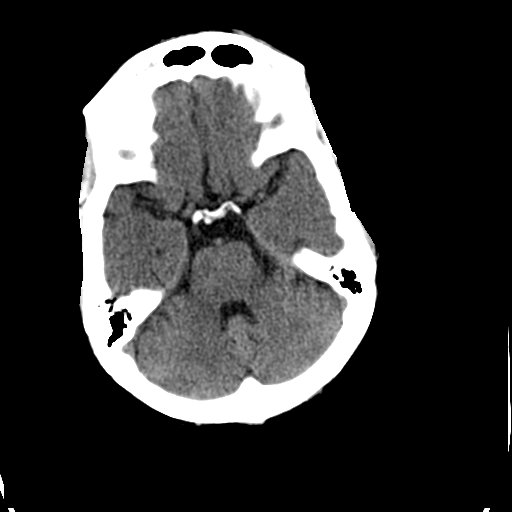
[im 9/30  bone]
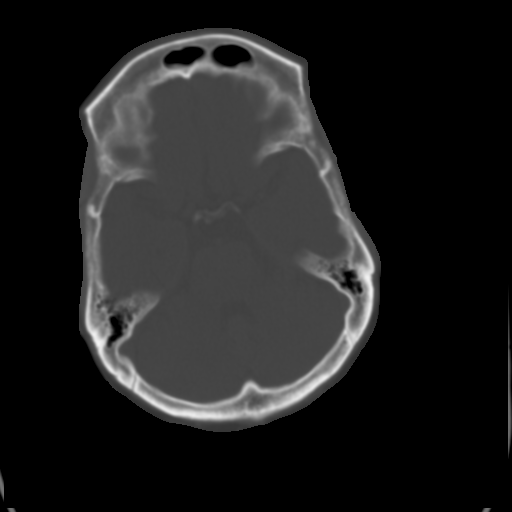
[im 11/30  brain]
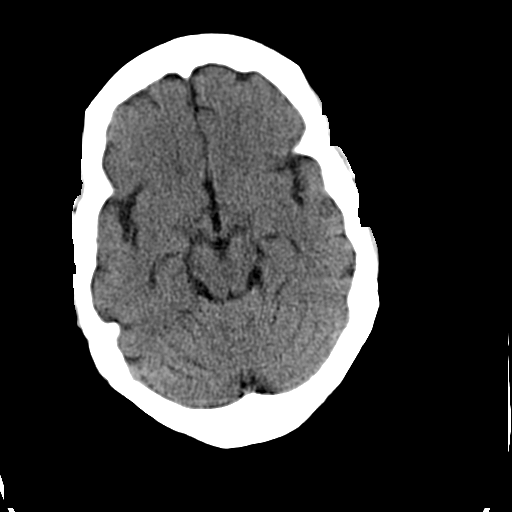
[im 13/30  brain]
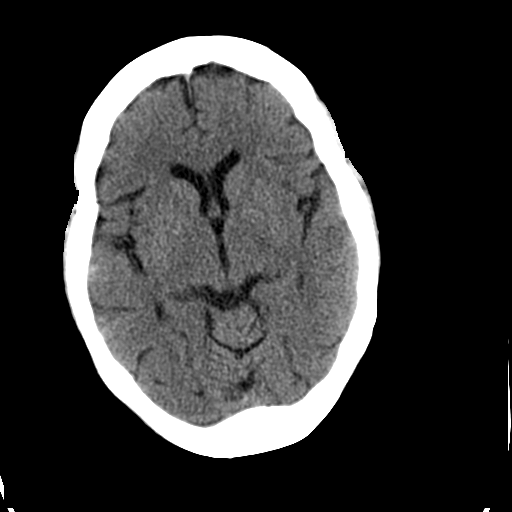
[im 15/30  brain]
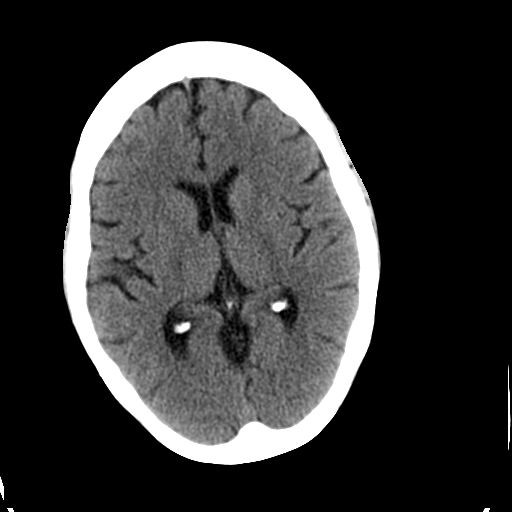
[im 16/30  brain]
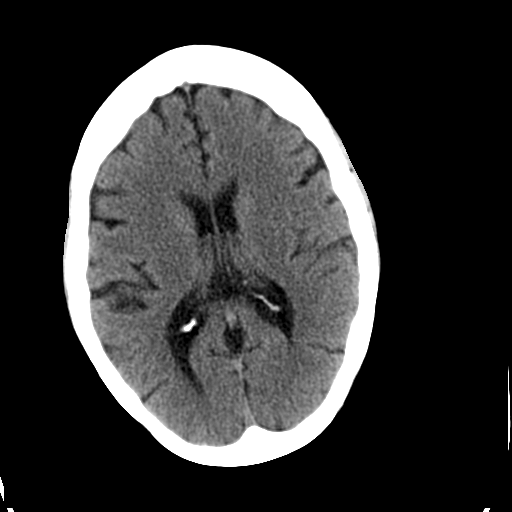
[im 16/30  bone]
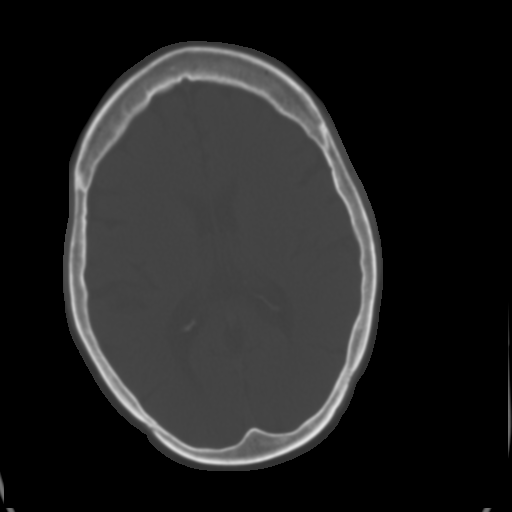
[im 18/30  brain]
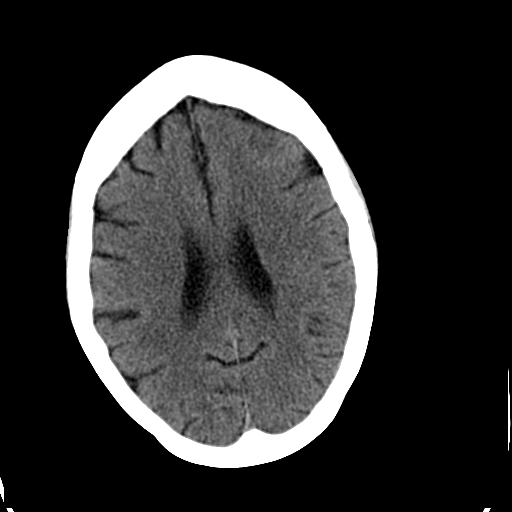
[im 20/30  brain]
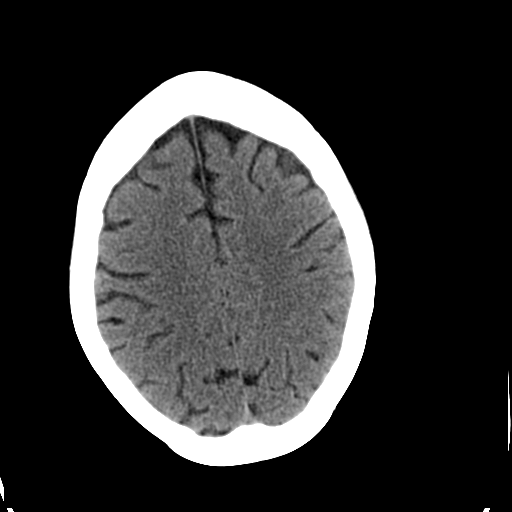
[im 22/30  brain]
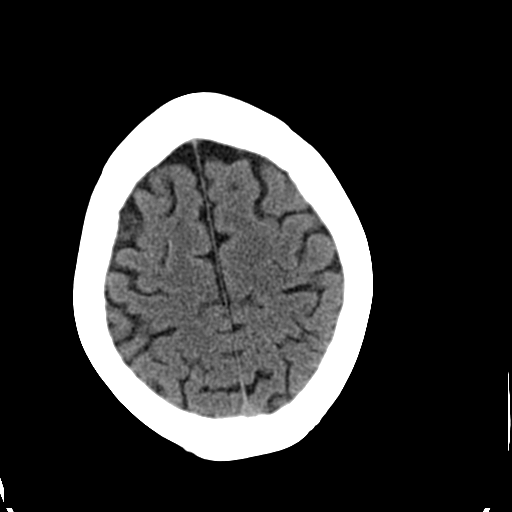
[im 23/30  brain]
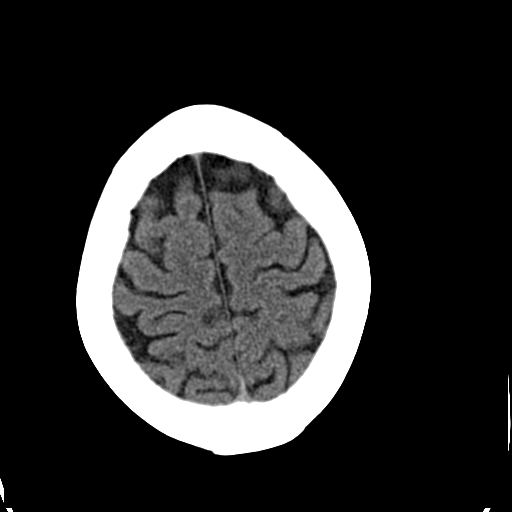
[im 23/30  bone]
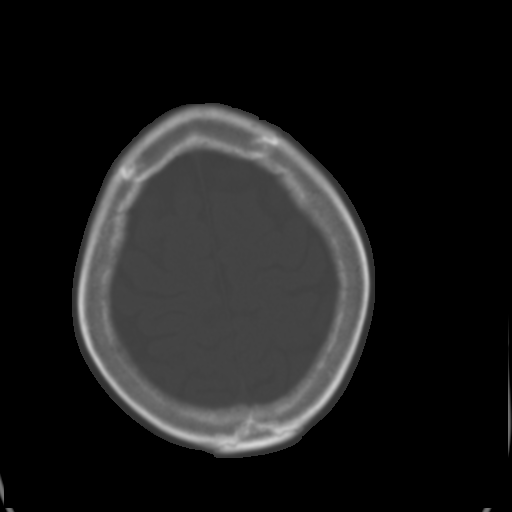
[im 25/30  brain]
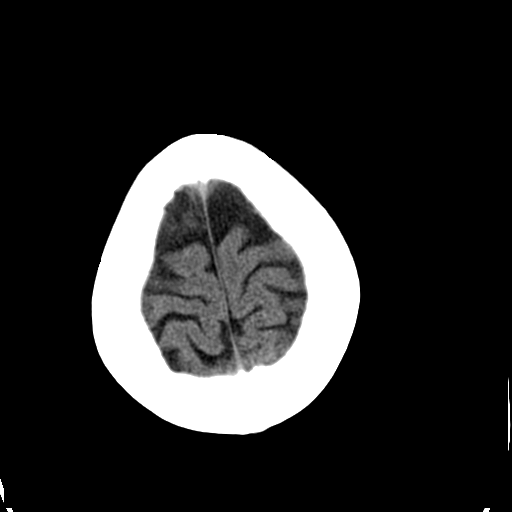
[im 27/30  brain]
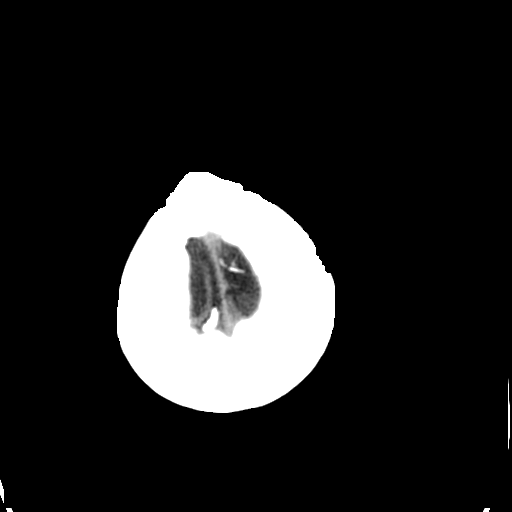
[im 29/30  brain]
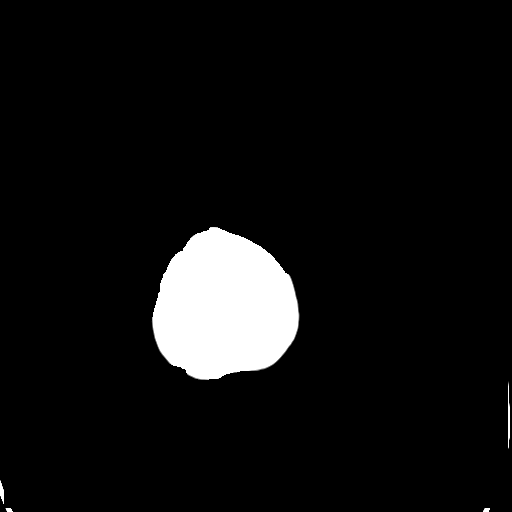

[16 of 30 positions shown; findings below may reference images not displayed]

FINDINGS: No acute intracranial hemorrhage. No focal mass lesion. No CT
evidence of acute infarction. No midline shift or mass effect. No
hydrocephalus. Basilar cisterns are patent. Paranasal sinuses and
mastoid air cells are clear.
IMPRESSION: 1. No acute intracranial findings.  No change from prior.

## 2016-02-07 IMAGING — CR DG CHEST 1V
1 series · 1 of 1 positions shown · non-contrast
Comparison: Prior radiograph performed earlier on the same day.

CLINICAL DATA: Follow-up endotracheal tube placement.

EXAM:
CHEST - 1 VIEW

[ap]
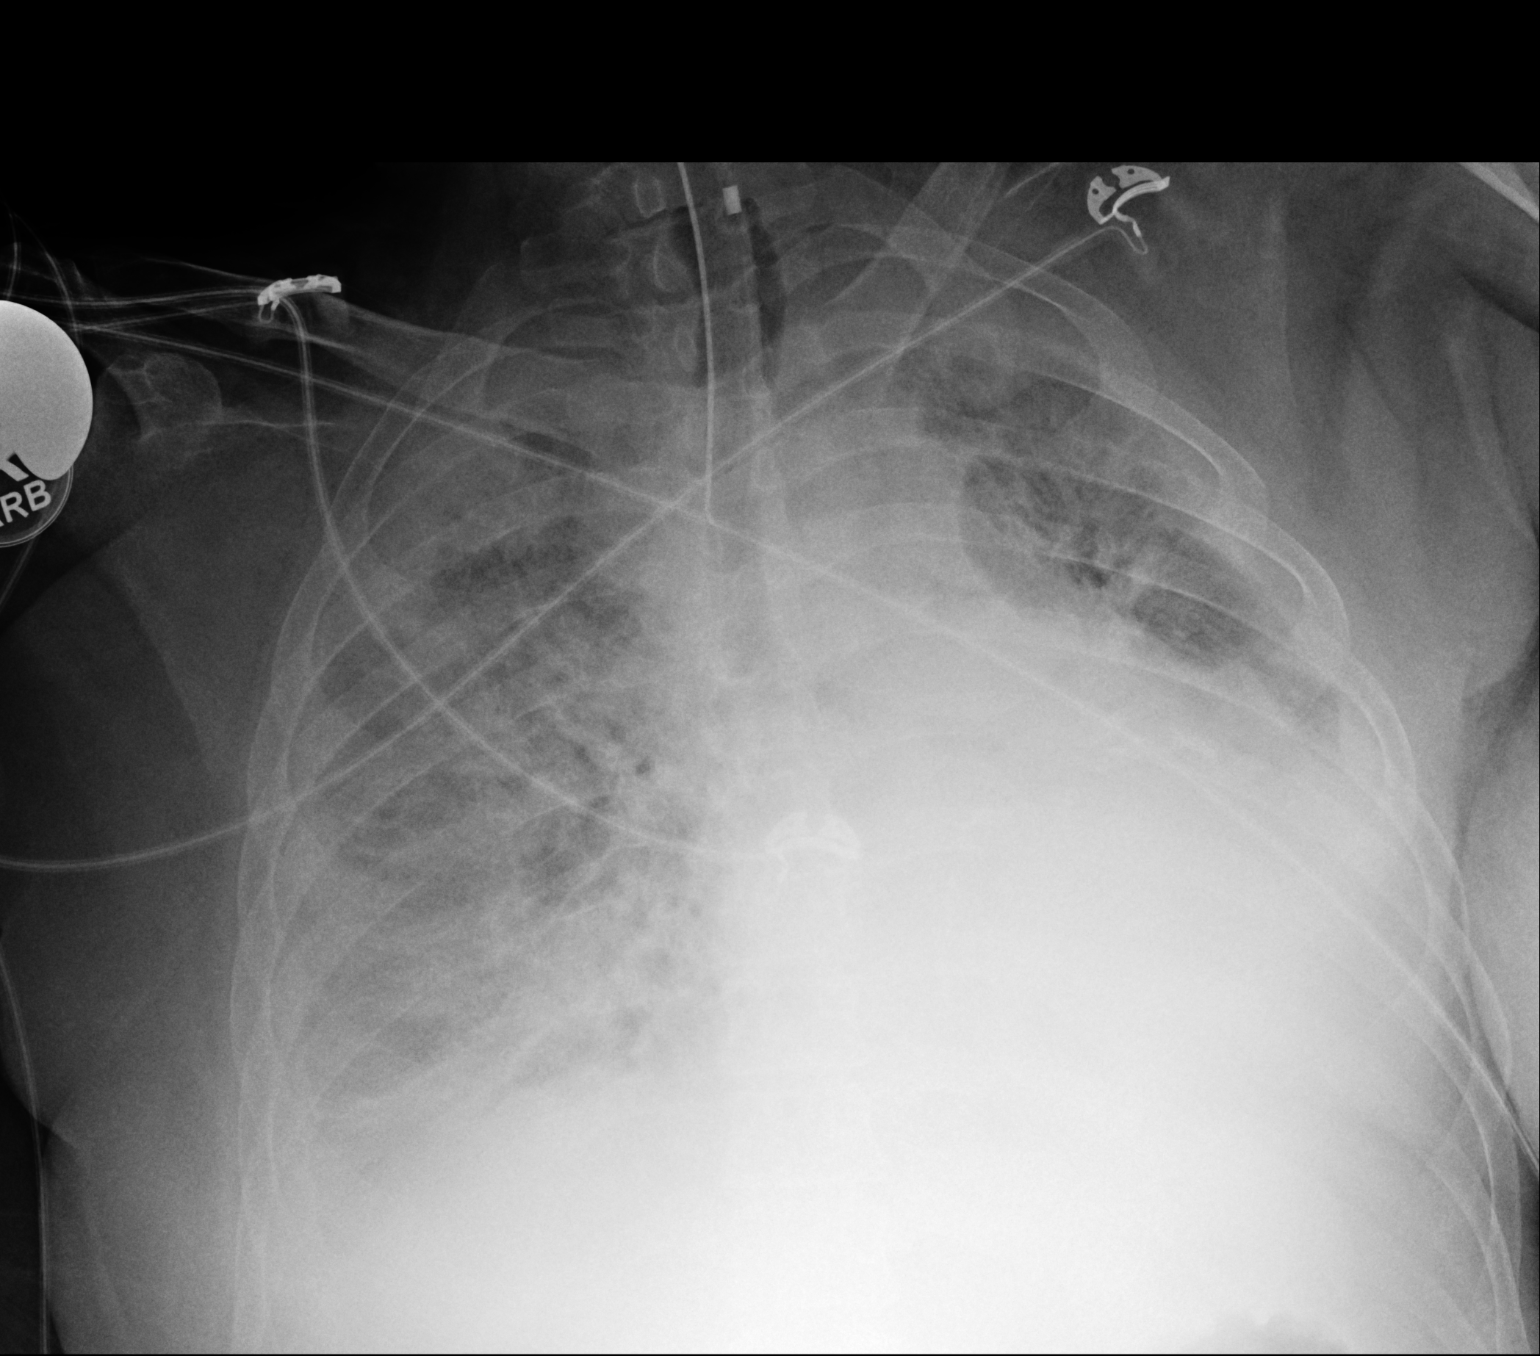

[1 of 1 positions shown; findings below may reference images not displayed]

FINDINGS: Endotracheal tube in place with tip located approximately 3.2 cm
above the carina.

Severe cardiomegaly again seen. Bibasilar airspace opacities are
slightly worsened, favored to reflect progressive pulmonary edema.
Rapidly progressive in infection could also be considered. Right
pleural effusion with probable left pleural effusion present. No
pneumothorax.

Osseous structures unchanged. Right shoulder arthroplasty partially
visualized.
IMPRESSION: 1. Tip of the endotracheal tube 3.2 cm above the carina.
2. Interval worsening and bilateral airspace disease, favored to
reflect progressive edema, although rapidly progressive pneumonia
could also be considered.
3. Bilateral pleural effusions.

## 2016-02-07 IMAGING — CR DG CHEST 1V
1 series · 1 of 1 positions shown · non-contrast
Comparison: 10/17/2014

CLINICAL DATA: Shortness of breath.  Pneumonia.

EXAM:
CHEST - 1 VIEW

[ap]
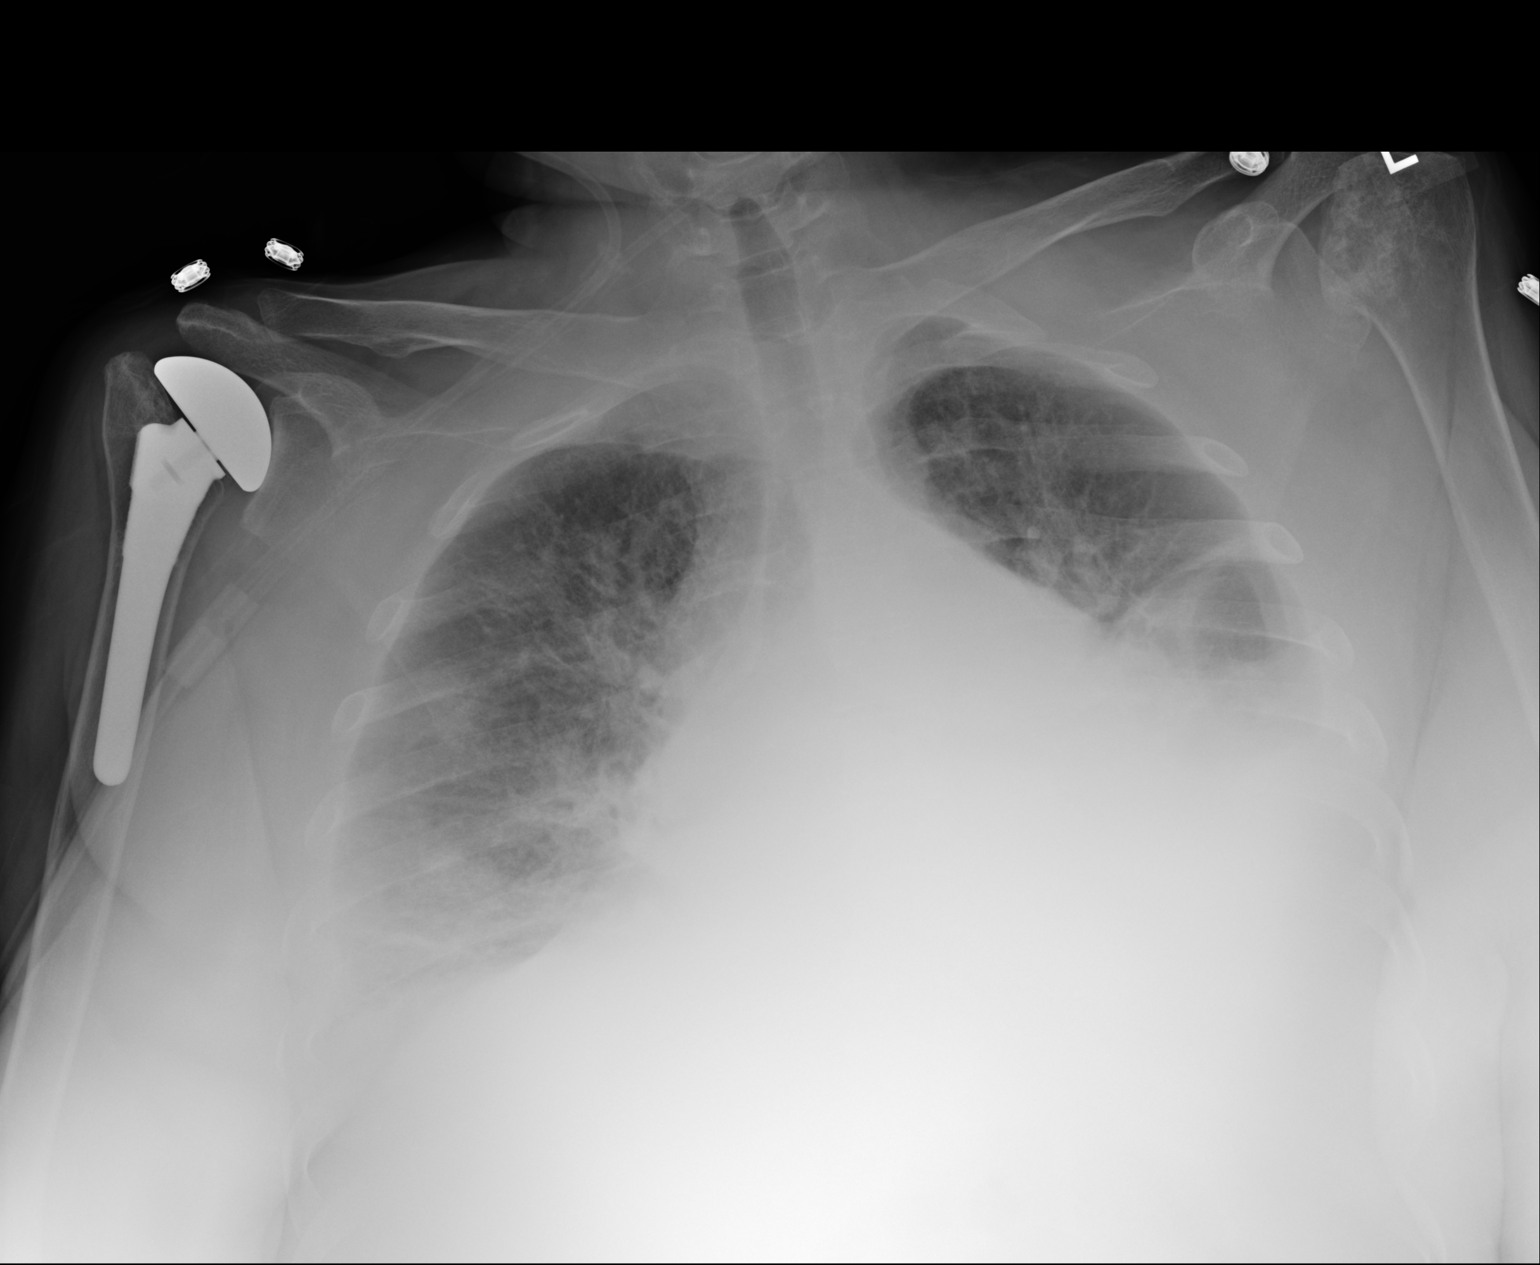

[1 of 1 positions shown; findings below may reference images not displayed]

FINDINGS: Very low lung volumes compared to prior. There are increasing hazy
opacity at the lung bases, left greater than right. Crowding of
bronchovascular structures with mixed interstitial and alveolar
perihilar opacities. Cardiac silhouette is obscured and not well
evaluated. No pneumothorax. Stable appearance of the osseous
structures.
IMPRESSION: Very low lung volumes with increased hazy bibasilar opacities, left
greater than right. Development of perihilar mixed interstitial and
alveolar opacities. CHF versus progressive pneumonia or aspiration.
